# Patient Record
Sex: Female | Born: 1955
Health system: Southern US, Community
[De-identification: ages and names within clinical notes are randomized; demographics above are authoritative.]

## PROBLEM LIST (undated history)

## (undated) DIAGNOSIS — J449 Chronic obstructive pulmonary disease, unspecified: Secondary | ICD-10-CM

## (undated) DIAGNOSIS — E119 Type 2 diabetes mellitus without complications: Secondary | ICD-10-CM

## (undated) DIAGNOSIS — I2699 Other pulmonary embolism without acute cor pulmonale: Secondary | ICD-10-CM

## (undated) DIAGNOSIS — I2781 Cor pulmonale (chronic): Secondary | ICD-10-CM

## (undated) DIAGNOSIS — J683 Other acute and subacute respiratory conditions due to chemicals, gases, fumes and vapors: Secondary | ICD-10-CM

## (undated) DIAGNOSIS — K219 Gastro-esophageal reflux disease without esophagitis: Secondary | ICD-10-CM

## (undated) DIAGNOSIS — I509 Heart failure, unspecified: Secondary | ICD-10-CM

## (undated) DIAGNOSIS — I4891 Unspecified atrial fibrillation: Secondary | ICD-10-CM

## (undated) DIAGNOSIS — E039 Hypothyroidism, unspecified: Secondary | ICD-10-CM

## (undated) DIAGNOSIS — M1990 Unspecified osteoarthritis, unspecified site: Secondary | ICD-10-CM

## (undated) HISTORY — PX: TONSILLECTOMY: SUR1361

## (undated) HISTORY — DX: Gastro-esophageal reflux disease without esophagitis: K21.9

## (undated) HISTORY — DX: Morbid (severe) obesity due to excess calories: E66.01

## (undated) HISTORY — DX: Unspecified atrial fibrillation: I48.91

## (undated) HISTORY — DX: Unspecified osteoarthritis, unspecified site: M19.90

## (undated) HISTORY — DX: Other acute and subacute respiratory conditions due to chemicals, gases, fumes and vapors: J68.3

## (undated) HISTORY — DX: Hypothyroidism, unspecified: E03.9

## (undated) HISTORY — DX: Other pulmonary embolism without acute cor pulmonale: I26.99

## (undated) HISTORY — DX: Cor pulmonale (chronic): I27.81

---

## 1999-07-07 ENCOUNTER — Other Ambulatory Visit: Admission: RE | Admit: 1999-07-07 | Discharge: 1999-07-07 | Payer: Self-pay | Admitting: Family Medicine

## 2002-05-19 ENCOUNTER — Encounter: Admission: RE | Admit: 2002-05-19 | Discharge: 2002-05-19 | Payer: Self-pay | Admitting: Chiropractic Medicine

## 2002-05-19 ENCOUNTER — Encounter: Payer: Self-pay | Admitting: Chiropractic Medicine

## 2003-07-02 ENCOUNTER — Encounter: Payer: Self-pay | Admitting: Family Medicine

## 2003-07-02 LAB — CONVERTED CEMR LAB

## 2004-09-05 ENCOUNTER — Ambulatory Visit: Payer: Self-pay | Admitting: Family Medicine

## 2004-10-30 ENCOUNTER — Ambulatory Visit: Payer: Self-pay | Admitting: Family Medicine

## 2005-02-14 ENCOUNTER — Ambulatory Visit: Payer: Self-pay | Admitting: Family Medicine

## 2005-02-22 ENCOUNTER — Ambulatory Visit: Payer: Self-pay | Admitting: Family Medicine

## 2005-04-09 ENCOUNTER — Ambulatory Visit: Payer: Self-pay | Admitting: Family Medicine

## 2005-05-10 ENCOUNTER — Ambulatory Visit: Payer: Self-pay | Admitting: Family Medicine

## 2005-07-24 ENCOUNTER — Ambulatory Visit: Payer: Self-pay | Admitting: Pulmonary Disease

## 2005-07-24 ENCOUNTER — Encounter: Payer: Self-pay | Admitting: Cardiology

## 2005-07-24 ENCOUNTER — Inpatient Hospital Stay (HOSPITAL_COMMUNITY): Admission: EM | Admit: 2005-07-24 | Discharge: 2005-07-31 | Payer: Self-pay | Admitting: Emergency Medicine

## 2005-07-24 ENCOUNTER — Ambulatory Visit: Payer: Self-pay | Admitting: Cardiology

## 2005-08-14 ENCOUNTER — Ambulatory Visit: Payer: Self-pay | Admitting: Pulmonary Disease

## 2005-09-11 ENCOUNTER — Ambulatory Visit: Payer: Self-pay | Admitting: Pulmonary Disease

## 2005-09-13 ENCOUNTER — Ambulatory Visit: Payer: Self-pay | Admitting: *Deleted

## 2005-09-19 ENCOUNTER — Ambulatory Visit: Payer: Self-pay | Admitting: Internal Medicine

## 2005-10-03 ENCOUNTER — Ambulatory Visit: Payer: Self-pay | Admitting: Cardiology

## 2005-10-26 ENCOUNTER — Ambulatory Visit: Payer: Self-pay | Admitting: Cardiology

## 2005-11-07 ENCOUNTER — Ambulatory Visit: Payer: Self-pay | Admitting: Cardiology

## 2005-12-10 ENCOUNTER — Ambulatory Visit: Payer: Self-pay | Admitting: Pulmonary Disease

## 2005-12-20 ENCOUNTER — Ambulatory Visit: Payer: Self-pay | Admitting: Cardiology

## 2006-01-25 ENCOUNTER — Ambulatory Visit: Payer: Self-pay | Admitting: Cardiology

## 2006-02-20 ENCOUNTER — Ambulatory Visit: Payer: Self-pay | Admitting: Pulmonary Disease

## 2006-03-08 ENCOUNTER — Ambulatory Visit: Payer: Self-pay | Admitting: Cardiology

## 2006-04-26 ENCOUNTER — Ambulatory Visit: Payer: Self-pay | Admitting: Cardiology

## 2006-06-28 ENCOUNTER — Ambulatory Visit: Payer: Self-pay | Admitting: Cardiology

## 2006-07-09 ENCOUNTER — Ambulatory Visit: Payer: Self-pay | Admitting: Pulmonary Disease

## 2006-09-19 ENCOUNTER — Ambulatory Visit: Payer: Self-pay | Admitting: Cardiology

## 2006-11-08 ENCOUNTER — Ambulatory Visit: Payer: Self-pay | Admitting: *Deleted

## 2006-12-23 ENCOUNTER — Ambulatory Visit: Payer: Self-pay | Admitting: Cardiology

## 2007-01-24 ENCOUNTER — Ambulatory Visit: Payer: Self-pay | Admitting: Cardiology

## 2007-02-21 ENCOUNTER — Ambulatory Visit: Payer: Self-pay | Admitting: Pulmonary Disease

## 2007-02-21 LAB — CONVERTED CEMR LAB
BUN: 21 mg/dL (ref 6–23)
Bilirubin Urine: NEGATIVE
CO2: 34 meq/L — ABNORMAL HIGH (ref 19–32)
Calcium: 11.3 mg/dL — ABNORMAL HIGH (ref 8.4–10.5)
Chloride: 100 meq/L (ref 96–112)
Creatinine, Ser: 0.8 mg/dL (ref 0.4–1.2)
GFR calc Af Amer: 97 mL/min
GFR calc non Af Amer: 80 mL/min
Glucose, Bld: 113 mg/dL — ABNORMAL HIGH (ref 70–99)
INR: 2.6 — ABNORMAL HIGH (ref 0.9–2.0)
Ketones, ur: NEGATIVE mg/dL
Leukocytes, UA: NEGATIVE
Nitrite: NEGATIVE
Potassium: 4.1 meq/L (ref 3.5–5.1)
Prothrombin Time: 20.2 s — ABNORMAL HIGH (ref 10.0–14.0)
Sodium: 140 meq/L (ref 135–145)
Specific Gravity, Urine: 1.02 (ref 1.000–1.03)
Total Protein, Urine: NEGATIVE mg/dL
Urine Glucose: NEGATIVE mg/dL
Urobilinogen, UA: 0.2 (ref 0.0–1.0)
pH: 5.5 (ref 5.0–8.0)

## 2007-04-25 ENCOUNTER — Ambulatory Visit: Payer: Self-pay | Admitting: Cardiology

## 2007-06-13 ENCOUNTER — Ambulatory Visit: Payer: Self-pay | Admitting: Cardiology

## 2007-07-25 ENCOUNTER — Ambulatory Visit: Payer: Self-pay | Admitting: Cardiovascular Disease

## 2007-08-11 DIAGNOSIS — E662 Morbid (severe) obesity with alveolar hypoventilation: Secondary | ICD-10-CM

## 2007-08-11 DIAGNOSIS — I279 Pulmonary heart disease, unspecified: Secondary | ICD-10-CM | POA: Insufficient documentation

## 2007-09-19 ENCOUNTER — Ambulatory Visit: Payer: Self-pay | Admitting: Cardiology

## 2007-10-17 ENCOUNTER — Ambulatory Visit: Payer: Self-pay | Admitting: Cardiology

## 2007-11-12 ENCOUNTER — Telehealth: Payer: Self-pay | Admitting: Pulmonary Disease

## 2007-11-21 ENCOUNTER — Ambulatory Visit: Payer: Self-pay | Admitting: Cardiology

## 2007-12-12 ENCOUNTER — Ambulatory Visit: Payer: Self-pay | Admitting: Pulmonary Disease

## 2007-12-17 ENCOUNTER — Telehealth (INDEPENDENT_AMBULATORY_CARE_PROVIDER_SITE_OTHER): Payer: Self-pay | Admitting: *Deleted

## 2008-01-23 ENCOUNTER — Ambulatory Visit: Payer: Self-pay | Admitting: Internal Medicine

## 2008-02-04 ENCOUNTER — Encounter: Payer: Self-pay | Admitting: Family Medicine

## 2008-02-04 DIAGNOSIS — Z86718 Personal history of other venous thrombosis and embolism: Secondary | ICD-10-CM

## 2008-02-04 DIAGNOSIS — J45909 Unspecified asthma, uncomplicated: Secondary | ICD-10-CM | POA: Insufficient documentation

## 2008-02-04 DIAGNOSIS — K219 Gastro-esophageal reflux disease without esophagitis: Secondary | ICD-10-CM | POA: Insufficient documentation

## 2008-02-04 DIAGNOSIS — M5137 Other intervertebral disc degeneration, lumbosacral region: Secondary | ICD-10-CM

## 2008-02-04 DIAGNOSIS — Z87891 Personal history of nicotine dependence: Secondary | ICD-10-CM | POA: Insufficient documentation

## 2008-02-13 ENCOUNTER — Ambulatory Visit: Payer: Self-pay | Admitting: Family Medicine

## 2008-02-13 DIAGNOSIS — E039 Hypothyroidism, unspecified: Secondary | ICD-10-CM | POA: Insufficient documentation

## 2008-02-20 ENCOUNTER — Ambulatory Visit: Payer: Self-pay | Admitting: Cardiovascular Disease

## 2008-04-16 ENCOUNTER — Ambulatory Visit: Payer: Self-pay | Admitting: Cardiology

## 2008-05-21 ENCOUNTER — Ambulatory Visit: Payer: Self-pay | Admitting: Internal Medicine

## 2008-06-25 ENCOUNTER — Ambulatory Visit: Payer: Self-pay | Admitting: Internal Medicine

## 2008-07-16 ENCOUNTER — Ambulatory Visit: Payer: Self-pay | Admitting: Family Medicine

## 2008-07-20 LAB — CONVERTED CEMR LAB
ALT: 31 units/L (ref 0–35)
AST: 24 units/L (ref 0–37)
Albumin: 3.5 g/dL (ref 3.5–5.2)
BUN: 25 mg/dL — ABNORMAL HIGH (ref 6–23)
CO2: 34 meq/L — ABNORMAL HIGH (ref 19–32)
Calcium: 11.4 mg/dL — ABNORMAL HIGH (ref 8.4–10.5)
Chloride: 103 meq/L (ref 96–112)
Cholesterol: 196 mg/dL (ref 0–200)
Creatinine, Ser: 0.8 mg/dL (ref 0.4–1.2)
GFR calc Af Amer: 96 mL/min
GFR calc non Af Amer: 80 mL/min
Glucose, Bld: 155 mg/dL — ABNORMAL HIGH (ref 70–99)
HDL: 31.3 mg/dL — ABNORMAL LOW (ref >39.0)
LDL Cholesterol: 133 mg/dL — ABNORMAL HIGH (ref 0–99)
Phosphorus: 1.8 mg/dL — ABNORMAL LOW (ref 2.3–4.6)
Potassium: 3.8 meq/L (ref 3.5–5.1)
Sodium: 143 meq/L (ref 135–145)
TSH: 5.81 microintl units/mL — ABNORMAL HIGH (ref 0.35–5.50)
Total CHOL/HDL Ratio: 6.3
Triglycerides: 161 mg/dL — ABNORMAL HIGH (ref 0–149)
VLDL: 32 mg/dL (ref 0–40)

## 2008-07-23 ENCOUNTER — Ambulatory Visit: Payer: Self-pay | Admitting: Cardiology

## 2008-07-30 ENCOUNTER — Ambulatory Visit: Payer: Self-pay | Admitting: Family Medicine

## 2008-07-30 DIAGNOSIS — E213 Hyperparathyroidism, unspecified: Secondary | ICD-10-CM

## 2008-08-04 LAB — CONVERTED CEMR LAB
Calcium, Total (PTH): 12.5 mg/dL — ABNORMAL HIGH (ref 8.4–10.5)
Hgb A1c MFr Bld: 7.1 % — ABNORMAL HIGH (ref 4.6–6.0)
PTH: 364.2 pg/mL — ABNORMAL HIGH (ref 14.0–72.0)

## 2008-08-13 ENCOUNTER — Ambulatory Visit: Payer: Self-pay | Admitting: Family Medicine

## 2008-08-13 DIAGNOSIS — E119 Type 2 diabetes mellitus without complications: Secondary | ICD-10-CM

## 2008-08-13 DIAGNOSIS — L538 Other specified erythematous conditions: Secondary | ICD-10-CM | POA: Insufficient documentation

## 2008-08-13 LAB — CONVERTED CEMR LAB
Bilirubin Urine: NEGATIVE
Glucose, Urine, Semiquant: NEGATIVE
Ketones, urine, test strip: NEGATIVE
Nitrite: NEGATIVE
Protein, U semiquant: NEGATIVE
Specific Gravity, Urine: 1.015
Urobilinogen, UA: 0.2
pH: 6

## 2008-08-14 ENCOUNTER — Encounter: Payer: Self-pay | Admitting: Family Medicine

## 2008-08-20 ENCOUNTER — Ambulatory Visit: Payer: Self-pay | Admitting: Cardiovascular Disease

## 2008-10-08 ENCOUNTER — Ambulatory Visit: Payer: Self-pay | Admitting: Cardiology

## 2008-11-19 ENCOUNTER — Ambulatory Visit: Payer: Self-pay | Admitting: Family Medicine

## 2008-11-22 ENCOUNTER — Inpatient Hospital Stay (HOSPITAL_COMMUNITY): Admission: EM | Admit: 2008-11-22 | Discharge: 2008-11-26 | Payer: Self-pay | Admitting: Internal Medicine

## 2008-11-22 ENCOUNTER — Telehealth (INDEPENDENT_AMBULATORY_CARE_PROVIDER_SITE_OTHER): Payer: Self-pay | Admitting: *Deleted

## 2008-11-22 ENCOUNTER — Ambulatory Visit: Payer: Self-pay | Admitting: Internal Medicine

## 2008-11-23 ENCOUNTER — Encounter: Payer: Self-pay | Admitting: Internal Medicine

## 2008-11-23 LAB — CONVERTED CEMR LAB
Hgb A1c MFr Bld: 6.2 % — ABNORMAL HIGH (ref 4.6–6.0)
TSH: 4.13 microintl units/mL (ref 0.35–5.50)

## 2008-11-29 ENCOUNTER — Ambulatory Visit: Payer: Self-pay | Admitting: Cardiology

## 2008-12-03 ENCOUNTER — Ambulatory Visit: Payer: Self-pay | Admitting: Family Medicine

## 2008-12-03 DIAGNOSIS — Z8679 Personal history of other diseases of the circulatory system: Secondary | ICD-10-CM

## 2008-12-07 LAB — CONVERTED CEMR LAB
Creatinine,U: 52.9 mg/dL
Microalb Creat Ratio: 3.8 mg/g (ref 0.0–30.0)
Microalb, Ur: 0.2 mg/dL (ref 0.0–1.9)

## 2008-12-10 ENCOUNTER — Ambulatory Visit: Payer: Self-pay | Admitting: Cardiovascular Disease

## 2008-12-13 ENCOUNTER — Encounter (INDEPENDENT_AMBULATORY_CARE_PROVIDER_SITE_OTHER): Payer: Self-pay | Admitting: *Deleted

## 2008-12-13 ENCOUNTER — Ambulatory Visit: Payer: Self-pay | Admitting: Internal Medicine

## 2009-01-05 ENCOUNTER — Telehealth: Payer: Self-pay | Admitting: Family Medicine

## 2009-01-07 ENCOUNTER — Ambulatory Visit: Payer: Self-pay | Admitting: Cardiology

## 2009-02-09 ENCOUNTER — Encounter (INDEPENDENT_AMBULATORY_CARE_PROVIDER_SITE_OTHER): Payer: Self-pay | Admitting: *Deleted

## 2009-02-18 ENCOUNTER — Ambulatory Visit: Payer: Self-pay | Admitting: Cardiology

## 2009-03-02 ENCOUNTER — Encounter: Payer: Self-pay | Admitting: *Deleted

## 2009-03-18 ENCOUNTER — Ambulatory Visit: Payer: Self-pay | Admitting: Internal Medicine

## 2009-03-18 LAB — CONVERTED CEMR LAB: POC INR: 1.6

## 2009-04-06 ENCOUNTER — Encounter: Payer: Self-pay | Admitting: *Deleted

## 2009-04-15 ENCOUNTER — Ambulatory Visit: Payer: Self-pay | Admitting: Cardiology

## 2009-04-15 LAB — CONVERTED CEMR LAB
POC INR: 1.2
Prothrombin Time: 13.6 s

## 2009-05-12 ENCOUNTER — Encounter (INDEPENDENT_AMBULATORY_CARE_PROVIDER_SITE_OTHER): Payer: Self-pay | Admitting: *Deleted

## 2009-05-17 ENCOUNTER — Ambulatory Visit: Payer: Self-pay | Admitting: Cardiology

## 2009-05-17 LAB — CONVERTED CEMR LAB: POC INR: 2.1

## 2009-06-20 ENCOUNTER — Telehealth: Payer: Self-pay | Admitting: Internal Medicine

## 2009-06-23 ENCOUNTER — Ambulatory Visit: Payer: Self-pay | Admitting: Internal Medicine

## 2009-06-23 LAB — CONVERTED CEMR LAB: POC INR: 2

## 2009-08-08 ENCOUNTER — Ambulatory Visit: Payer: Self-pay | Admitting: Internal Medicine

## 2009-08-08 LAB — CONVERTED CEMR LAB: POC INR: 1.8

## 2009-08-16 ENCOUNTER — Ambulatory Visit: Payer: Self-pay | Admitting: Family Medicine

## 2009-08-16 DIAGNOSIS — E7800 Pure hypercholesterolemia, unspecified: Secondary | ICD-10-CM

## 2009-08-16 LAB — CONVERTED CEMR LAB
ALT: 29 units/L (ref 0–35)
AST: 18 units/L (ref 0–37)
Albumin: 3.6 g/dL (ref 3.5–5.2)
BUN: 16 mg/dL (ref 6–23)
CO2: 34 meq/L — ABNORMAL HIGH (ref 19–32)
Calcium: 11.4 mg/dL — ABNORMAL HIGH (ref 8.4–10.5)
Chloride: 104 meq/L (ref 96–112)
Cholesterol: 212 mg/dL — ABNORMAL HIGH (ref 0–200)
Creatinine, Ser: 0.7 mg/dL (ref 0.4–1.2)
Direct LDL: 157.6 mg/dL
GFR calc non Af Amer: 92.56 mL/min (ref >60)
Glucose, Bld: 143 mg/dL — ABNORMAL HIGH (ref 70–99)
HDL: 38.1 mg/dL — ABNORMAL LOW (ref >39.00)
Hgb A1c MFr Bld: 6 % (ref 4.6–6.5)
Phosphorus: 1.6 mg/dL — ABNORMAL LOW (ref 2.3–4.6)
Potassium: 4.4 meq/L (ref 3.5–5.1)
Sodium: 145 meq/L (ref 135–145)
TSH: 12.27 microintl units/mL — ABNORMAL HIGH (ref 0.35–5.50)
Total CHOL/HDL Ratio: 6
Triglycerides: 155 mg/dL — ABNORMAL HIGH (ref 0.0–149.0)
VLDL: 31 mg/dL (ref 0.0–40.0)

## 2009-08-19 ENCOUNTER — Ambulatory Visit: Payer: Self-pay | Admitting: Family Medicine

## 2009-08-19 DIAGNOSIS — Z87448 Personal history of other diseases of urinary system: Secondary | ICD-10-CM

## 2009-08-19 LAB — CONVERTED CEMR LAB
Bilirubin Urine: NEGATIVE
Glucose, Urine, Semiquant: NEGATIVE
Ketones, urine, test strip: NEGATIVE
Nitrite: NEGATIVE
Protein, U semiquant: NEGATIVE
Specific Gravity, Urine: 1.015
Urobilinogen, UA: 0.2
WBC Urine, dipstick: NEGATIVE
pH: 5

## 2009-09-01 ENCOUNTER — Ambulatory Visit: Payer: Self-pay | Admitting: Cardiovascular Disease

## 2009-09-01 LAB — CONVERTED CEMR LAB: POC INR: 2.3

## 2009-09-21 ENCOUNTER — Ambulatory Visit: Payer: Self-pay | Admitting: Internal Medicine

## 2009-09-21 ENCOUNTER — Encounter (INDEPENDENT_AMBULATORY_CARE_PROVIDER_SITE_OTHER): Payer: Self-pay | Admitting: Cardiology

## 2009-09-21 LAB — CONVERTED CEMR LAB: POC INR: 1.8

## 2009-09-28 ENCOUNTER — Encounter: Payer: Self-pay | Admitting: Family Medicine

## 2009-09-29 ENCOUNTER — Ambulatory Visit: Payer: Self-pay | Admitting: Family Medicine

## 2009-09-30 ENCOUNTER — Ambulatory Visit: Payer: Self-pay | Admitting: Internal Medicine

## 2009-09-30 ENCOUNTER — Inpatient Hospital Stay (HOSPITAL_COMMUNITY): Admission: EM | Admit: 2009-09-30 | Discharge: 2009-10-06 | Payer: Self-pay | Admitting: Emergency Medicine

## 2009-10-01 ENCOUNTER — Ambulatory Visit: Payer: Self-pay | Admitting: Emergency Medicine

## 2009-10-06 ENCOUNTER — Encounter: Payer: Self-pay | Admitting: Family Medicine

## 2009-10-17 ENCOUNTER — Ambulatory Visit: Payer: Self-pay | Admitting: Cardiology

## 2009-10-17 LAB — CONVERTED CEMR LAB: POC INR: 3.7

## 2009-11-10 ENCOUNTER — Ambulatory Visit: Payer: Self-pay | Admitting: Cardiology

## 2009-11-22 ENCOUNTER — Ambulatory Visit: Payer: Self-pay | Admitting: Family Medicine

## 2009-11-25 ENCOUNTER — Encounter: Payer: Self-pay | Admitting: Family Medicine

## 2009-11-25 LAB — CONVERTED CEMR LAB
ALT: 21 units/L (ref 0–35)
CO2: 32 meq/L (ref 19–32)
Creatinine, Ser: 0.7 mg/dL (ref 0.4–1.2)
GFR calc non Af Amer: 92.47 mL/min (ref >60)
HDL: 48.2 mg/dL (ref >39.00)
Hgb A1c MFr Bld: 5.9 % (ref 4.6–6.5)
Potassium: 4.2 meq/L (ref 3.5–5.1)
Sodium: 142 meq/L (ref 135–145)
Total CHOL/HDL Ratio: 3

## 2009-12-16 ENCOUNTER — Ambulatory Visit: Payer: Self-pay | Admitting: Cardiovascular Disease

## 2009-12-16 ENCOUNTER — Encounter (INDEPENDENT_AMBULATORY_CARE_PROVIDER_SITE_OTHER): Payer: Self-pay | Admitting: Cardiology

## 2010-01-25 ENCOUNTER — Telehealth: Payer: Self-pay | Admitting: Family Medicine

## 2010-01-27 ENCOUNTER — Ambulatory Visit: Payer: Self-pay | Admitting: Cardiology

## 2010-02-01 ENCOUNTER — Telehealth: Payer: Self-pay | Admitting: Family Medicine

## 2010-02-06 ENCOUNTER — Encounter: Payer: Self-pay | Admitting: Family Medicine

## 2010-02-08 ENCOUNTER — Telehealth: Payer: Self-pay | Admitting: Family Medicine

## 2010-03-03 ENCOUNTER — Ambulatory Visit: Payer: Self-pay | Admitting: Cardiovascular Disease

## 2010-04-21 ENCOUNTER — Encounter: Payer: Self-pay | Admitting: Cardiology

## 2010-04-21 ENCOUNTER — Ambulatory Visit: Payer: Self-pay | Admitting: Family Medicine

## 2010-04-21 LAB — CONVERTED CEMR LAB
Bilirubin Urine: NEGATIVE
Blood in Urine, dipstick: NEGATIVE
Glucose, Urine, Semiquant: NEGATIVE
POC INR: 1.98
WBC Urine, dipstick: NEGATIVE
pH: 5

## 2010-04-23 LAB — CONVERTED CEMR LAB
Calcium: 12.2 mg/dL — ABNORMAL HIGH (ref 8.4–10.5)
Hgb A1c MFr Bld: 6.5 % — ABNORMAL HIGH (ref <5.7)
Phosphorus: 2.2 mg/dL — ABNORMAL LOW (ref 2.3–4.6)
Potassium: 4.6 meq/L (ref 3.5–5.3)
Prothrombin Time: 22.3 s — ABNORMAL HIGH (ref 11.6–15.2)
Sodium: 140 meq/L (ref 135–145)
TSH: 5.616 microintl units/mL — ABNORMAL HIGH (ref 0.350–4.500)

## 2010-04-25 ENCOUNTER — Encounter: Payer: Self-pay | Admitting: Cardiology

## 2010-05-18 ENCOUNTER — Ambulatory Visit: Payer: Self-pay | Admitting: Cardiovascular Disease

## 2010-06-06 ENCOUNTER — Encounter: Payer: Self-pay | Admitting: Family Medicine

## 2010-06-15 ENCOUNTER — Ambulatory Visit: Payer: Self-pay | Admitting: Cardiology

## 2010-06-21 ENCOUNTER — Telehealth: Payer: Self-pay | Admitting: Family Medicine

## 2010-07-26 ENCOUNTER — Encounter: Payer: Self-pay | Admitting: Internal Medicine

## 2010-07-26 ENCOUNTER — Ambulatory Visit: Payer: Self-pay | Admitting: Family Medicine

## 2010-07-26 LAB — CONVERTED CEMR LAB: POC INR: 1.6

## 2010-07-27 LAB — CONVERTED CEMR LAB
BUN: 20 mg/dL (ref 6–23)
CO2: 29 meq/L (ref 19–32)
Calcium: 11.1 mg/dL — ABNORMAL HIGH (ref 8.4–10.5)
Creatinine, Ser: 0.7 mg/dL (ref 0.4–1.2)
Hgb A1c MFr Bld: 7.5 % — ABNORMAL HIGH (ref 4.6–6.5)
Prothrombin Time: 16.7 s — ABNORMAL HIGH (ref 9.7–11.8)

## 2010-07-28 ENCOUNTER — Encounter: Payer: Self-pay | Admitting: Internal Medicine

## 2010-08-03 ENCOUNTER — Ambulatory Visit: Payer: Self-pay | Admitting: Family Medicine

## 2010-08-17 ENCOUNTER — Ambulatory Visit: Payer: Self-pay | Admitting: Cardiovascular Disease

## 2010-08-17 LAB — CONVERTED CEMR LAB: POC INR: 2.1

## 2010-09-14 ENCOUNTER — Ambulatory Visit: Payer: Self-pay | Admitting: Internal Medicine

## 2010-09-14 LAB — CONVERTED CEMR LAB: POC INR: 2.3

## 2010-09-15 ENCOUNTER — Ambulatory Visit: Payer: Self-pay | Admitting: Family Medicine

## 2010-09-19 ENCOUNTER — Ambulatory Visit: Payer: Self-pay | Admitting: Family Medicine

## 2010-09-21 ENCOUNTER — Telehealth: Payer: Self-pay | Admitting: Family Medicine

## 2010-09-29 ENCOUNTER — Ambulatory Visit: Payer: Self-pay | Admitting: Family Medicine

## 2010-10-11 ENCOUNTER — Ambulatory Visit
Admission: RE | Admit: 2010-10-11 | Discharge: 2010-10-11 | Payer: Self-pay | Source: Home / Self Care | Attending: Family Medicine | Admitting: Family Medicine

## 2010-10-12 ENCOUNTER — Telehealth: Payer: Self-pay | Admitting: Family Medicine

## 2010-10-17 ENCOUNTER — Ambulatory Visit
Admission: RE | Admit: 2010-10-17 | Discharge: 2010-10-17 | Payer: Self-pay | Source: Home / Self Care | Attending: Family Medicine | Admitting: Family Medicine

## 2010-10-17 ENCOUNTER — Encounter: Payer: Self-pay | Admitting: Cardiology

## 2010-10-18 ENCOUNTER — Encounter: Payer: Self-pay | Admitting: Cardiology

## 2010-10-30 ENCOUNTER — Telehealth (INDEPENDENT_AMBULATORY_CARE_PROVIDER_SITE_OTHER): Payer: Self-pay | Admitting: *Deleted

## 2010-10-31 NOTE — Letter (Signed)
Summary: CMN for CPAP/[facility]  CMN for CPAP/[facility]   Imported By: Lanelle Bal 06/13/2010 12:59:12  _____________________________________________________________________  External Attachment:    Type:   Image     Comment:   External Document

## 2010-10-31 NOTE — Medication Information (Signed)
Summary: CCR  Anticoagulant Therapy  Managed by: Cloyde Reams, RN, BSN Referring MD: Dr Tenny Craw PCP: Judith Part MD Supervising MD: Daleen Squibb MD, Maisie Fus Indication 1: Pulmonary embolus (ICD-415.19) Lab Used: [facility] Site: [facility] INR POC 4.1 INR RANGE 2 - 3  Dietary changes: no    Health status changes: no    Bleeding/hemorrhagic complications: no    Recent/future hospitalizations: no    Any changes in medication regimen? no    Recent/future dental: no  Any missed doses?: no       Is patient compliant with meds? yes       Allergies: 1)  ! Sudafed  Anticoagulation Management History:      The patient is taking warfarin and comes in today for a routine follow up visit.  Positive risk factors for bleeding include presence of serious comorbidities.  Negative risk factors for bleeding include an age less than 68 years old.  The bleeding index is 'intermediate risk'.  Positive CHADS2 values include History of Diabetes.  Negative CHADS2 values include Age > 40 years old.  The start date was 08/31/2005.  Her last INR was 2.6 RATIO.  Anticoagulation responsible provider: Daleen Squibb MD, Maisie Fus.  INR POC: 4.1.  Cuvette Lot#: [id number].  Exp: 12/2010.    Anticoagulation Management Assessment/Plan:      The patient's current anticoagulation dose is Coumadin 5 mg  tabs: Take as directed by coumadin clinic..  The target INR is 2 - 3.  The next INR is due 12/01/2009.  Anticoagulation instructions were given to patient.  Results were reviewed/authorized by Cloyde Reams, RN, BSN.  She was notified by Cloyde Reams RN.         Prior Anticoagulation Instructions: INR 3.7  Instructed pt to hold tonight's dose of coumadin, decr dosage to 1 tablet daily except 1/2 tablet on Mondays and Fridays.  Recheck in 3 weeks.    Current Anticoagulation Instructions: INR 4.1  Hold x 1 dose then start taking 1 tablet daily except 1/2 tablet on Mondays, Wednesdays and Fridays.  Recheck in 3 weeks.  Pt WCB  for appt.

## 2010-10-31 NOTE — Miscellaneous (Signed)
Summary: Levothyroid rx  Medications Added LEVOTHROID 125 MCG TABS (LEVOTHYROXINE SODIUM) Take one tablet by mouth once daily       Clinical Lists Changes  Medications: Added new medication of LEVOTHROID 125 MCG TABS (LEVOTHYROXINE SODIUM) Take one tablet by mouth once daily - Signed Removed medication of LEVOTHROID 112 MCG TABS (LEVOTHYROXINE SODIUM) 1 by mouth once daily Rx of LEVOTHROID 125 MCG TABS (LEVOTHYROXINE SODIUM) Take one tablet by mouth once daily;  #30 x 11;  Signed;  Entered by: Lewanda Rife LPN;  Authorized by: Judith Part MD;  Method used: [id number] [address] [place], [place], [place]  [address], Ph: [phone], Fax: [phone]    Prescriptions: LEVOTHROID 125 MCG TABS (LEVOTHYROXINE SODIUM) Take one tablet by mouth once daily  #30 x 11   Entered by:   Lewanda Rife LPN   Authorized by:   Judith Part MD   Signed by:   Lewanda Rife LPN on 69/62/9528   Method used:   [facility]ically to        [facility] ([facility]ail)       [address]       [place], [place]  [address]       Ph: [phone]       Fax: [phone]   RxID:   [id number]   Prior Medications: ADVAIR DISKUS 250-50 MCG/DOSE  MISC (FLUTICASONE-SALMETEROL) 1 Inhalation two times a day FUROSEMIDE 80 MG TABS (FUROSEMIDE) Take 1 tablet by mouth two times a day OMEPRAZOLE 20 MG  CPDR (OMEPRAZOLE) Take 1 tablet by mouth two times a day COUMADIN 5 MG  TABS (WARFARIN SODIUM) Take as directed by coumadin clinic. KLOR-CON 20 MEQ  PACK (POTASSIUM CHLORIDE) take 2 tabs by mouth once daily NASONEX 50 MCG/ACT  SUSP (MOMETASONE FUROATE) 1 spray each nostril once daily DICLOFENAC SODIUM 75 MG  TBEC (DICLOFENAC SODIUM) 1 by mouth two times a day with food as needed pain ELOCON 0.1 %  CREA (MOMETASONE FUROATE) tiny amount to each ear canal twice weekly as needed CICLOPIROX OLAMINE 0.77 % CREA (CICLOPIROX OLAMINE) apply to aff  area two times a day prn DILTIAZEM HCL 120 MG TABS (DILTIAZEM HCL) 1 by mouth once daily ONETOUCH ULTRA TEST  STRP (GLUCOSE BLOOD) for one touch ultra mini glucometer, to test blood sugar three times a day and as needed ONETOUCH ULTRASOFT LANCETS  MISC (LANCETS) to test blood sugar  three times a day and as needed ZOCOR 20 MG TABS (SIMVASTATIN) 1 by mouth once daily in evening OXYGEN () 3 Liters 24/7 CLARITIN 10 MG TABS (LORATADINE) OTC As directed. LEVOTHROID 125 MCG TABS (LEVOTHYROXINE SODIUM) Take one tablet by mouth once daily Current Allergies: ! SUDAFED

## 2010-10-31 NOTE — Medication Information (Signed)
Summary: ccr/per pulmonary  Anticoagulant Therapy  Managed by: Cloyde Reams, RN, BSN Referring MD: Dr Tenny Craw PCP: Judith Part MD Supervising MD: Jens Som MD, Arlys John Indication 1: Pulmonary embolus (ICD-415.19) Lab Used: [facility] Site: [facility] INR POC 3.7 INR RANGE 2 - 3    Bleeding/hemorrhagic complications: no     Any changes in medication regimen? no     Any missed doses?: yes     Details: Took 1 tablet on Monday instead of 1/2, so then took 1/2 on Friday instead of 1 tablet to make up for extra 1/2 tablet.      Allergies: 1)  ! Sudafed  Anticoagulation Management History:      The patient is taking warfarin and comes in today for a routine follow up visit.  Positive risk factors for bleeding include presence of serious comorbidities.  Negative risk factors for bleeding include an age less than 33 years old.  The bleeding index is 'intermediate risk'.  Positive CHADS2 values include History of Diabetes.  Negative CHADS2 values include Age > 48 years old.  The start date was 08/31/2005.  Her last INR was 2.6 RATIO.  Anticoagulation responsible provider: Jens Som MD, Arlys John.  INR POC: 3.7.  Cuvette Lot#: [id number].  Exp: 12/2010.    Anticoagulation Management Assessment/Plan:      The patient's current anticoagulation dose is Coumadin 5 mg  tabs: Take as directed by coumadin clinic..  The target INR is 2 - 3.  The next INR is due 11/07/2009.  Anticoagulation instructions were given to patient.  Results were reviewed/authorized by Cloyde Reams, RN, BSN.  She was notified by Cloyde Reams RN.         Prior Anticoagulation Instructions: INR 1.8  Take 1.5 tabs today, then begin 1 tab daily except 0.5 tab each Monday.  Recheck in 3 - 4 weeks.    Current Anticoagulation Instructions: INR 3.7  Instructed pt to hold tonight's dose of coumadin, decr dosage to 1 tablet daily except 1/2 tablet on Mondays and Fridays.  Recheck in 3 weeks.

## 2010-10-31 NOTE — Assessment & Plan Note (Signed)
Summary: 3 MONTH FOLLWO UP/[facility]   Vital Signs:  Patient profile:   55 year old female Temp:     97.7 degrees F oral Pulse rate:   76 / minute Pulse rhythm:   regular Resp:     20 per minute BP sitting:   114 / 76  (left arm) Cuff size:   large  Vitals Entered By: Lewanda Rife LPN (November 22, 2009 8:14 AM)  History of Present Illness: here for f/u of mult med probs  was in hosp for resp failure/ pneumonia/ cor pulm- better from that--had episode of syncope  also acute renal insufff from dehydration- corrected itself   is on cpap now -- is helping her a lot / this helps energy level   following ca with endo -- at [place]  did some tests  suggested getting a surgeon in the future   thyroid due for check-- does not feel like any changes in it   lipids duee for check  DM has been well controlled - due for check was on steroids in the hospital - so exp her AIC to be up a bit  has not had eye exam  utd imms    is interested in exercise when she can-- is generally sob with exertion of any kind  has pulm f/u in 6 mo    Allergies: 1)  ! Sudafed  Past History:  Past Medical History: Last updated: 09/30/2009 GERD Pulmonary embolism, hx of--on coumadin morbid obesity reactive airways cor pulmonale, and obesity hypoventilation syndrome hypothyroidism OA- widespread 2010 a fib- with cardioversion/successful   Echo 11/03/2008  -  Abnormal septal motion Overall left ventricular systolic function         was normal. Left ventricular ejection fraction was estimated         to be 55 %.   -  The aortic valve was mildly calcified.    pulm----Dr Clance  Past Surgical History: Last updated: 02/13/2008 Tonsillectomy Hosp- resp/ cor pulmonale,  PE  Family History: Last updated: 08/13/2008 Father: MI age 71 Mother:  Siblings: brother with DM- severe   Social History: Last updated: 08/19/2009 Marital Status: Married Children: 1 son Occupation: Scientist, physiological smoker- quit    Risk Factors: Smoking Status: quit (02/04/2008)  Review of Systems General:  Complains of fatigue; denies fever, loss of appetite, and malaise. Eyes:  Denies blurring and eye pain. CV:  Complains of shortness of breath with exertion; denies chest pain or discomfort, lightheadness, and palpitations. Resp:  Complains of shortness of breath; denies sputum productive and wheezing. GI:  Denies abdominal pain, change in bowel habits, and indigestion. GU:  Denies dysuria. MS:  Complains of joint pain and stiffness. Derm:  Denies itching, lesion(s), poor wound healing, and rash. Neuro:  Denies numbness and tingling. Psych:  mood is fair . Endo:  Denies excessive thirst and excessive urination. Heme:  Denies abnormal bruising and bleeding.  Physical Exam  General:  morbidly obese and in wheelchair  Head:  normocephalic, atraumatic, and no abnormalities observed.   Eyes:  vision grossly intact, pupils equal, pupils round, and pupils reactive to light.  no conjunctival pallor, injection or icterus  Ears:  R ear normal and L ear normal.   Nose:  no nasal discharge.   Mouth:  pharynx pink and moist.   Neck:  supple with full rom and no masses or thyromegally, no JVD or carotid bruit  Chest Wall:  No deformities, masses, or tenderness noted. Lungs:  difffusely distant bs no  rales or crackles today  mildly prolonged exp phase Heart:  RRR, no M or gallop  Abdomen:  soft and non-tender.   Msk:  No deformity or scoliosis noted of thoracic or lumbar spine.   Pulses:  plus one pedal pulses Extremities:  trace left pedal edema and trace right pedal edema.   Neurologic:  sensation intact to light touch, gait normal, and DTRs symmetrical and normal.   Skin:  Intact without suspicious lesions or rashes Cervical Nodes:  No lymphadenopathy noted Psych:  in good spirits today   Impression & Recommendations:  Problem # 1:  PURE HYPERCHOLESTEROLEMIA (ICD-272.0) Assessment Unchanged  labs today -  exp imp on zocor and good diet  f/u 3 mo rev low sat fat diet  Her updated medication list for this problem includes:    Zocor 20 Mg Tabs (Simvastatin) .Marland Kitchen... 1 by mouth once daily in evening  Orders: Venipuncture ([address]) TLB-Lipid Panel (80061-LIPID) TLB-Renal Function Panel (80069-RENAL) TLB-ALT (SGPT) (84460-ALT) TLB-AST (SGOT) (84450-SGOT) TLB-TSH (Thyroid Stimulating Hormone) (84443-TSH) TLB-A1C / Hgb A1C (Glycohemoglobin) (83036-A1C)  Labs Reviewed: SGOT: 18 (08/16/2009)   SGPT: 29 (08/16/2009)   HDL:38.10 (08/16/2009), 31.3 (07/16/2008)  LDL:133 (07/16/2008)  Chol:212 (08/16/2009), 196 (07/16/2008)  Trig:155.0 (08/16/2009), 161 (07/16/2008)  Problem # 2:  RESPIRATORY FAILURE, ACUTE (ICD-518.81) Assessment: Comment Only resolved - s/p hosp up to date on imms  adv wt loss doing better on cpap  under pulm care   Problem # 3:  DIABETES MELLITUS, MILD (ICD-250.00) Assessment: Unchanged  expect inc AIC with recent steriods disc healthy diet (low simple sugar/ choose complex carbs/ low sat fat) diet and exercise in detail  exercise as tolerated  still cannot afford eye exam  Orders: Venipuncture ([address]) TLB-Lipid Panel (80061-LIPID) TLB-Renal Function Panel (80069-RENAL) TLB-ALT (SGPT) (84460-ALT) TLB-AST (SGOT) (84450-SGOT) TLB-TSH (Thyroid Stimulating Hormone) (84443-TSH) TLB-A1C / Hgb A1C (Glycohemoglobin) (83036-A1C)  Labs Reviewed: Creat: 0.7 (08/16/2009)    Reviewed HgBA1c results: 6.0 (08/16/2009)  6.2 (11/19/2008)  Problem # 4:  HYPERCALCEMIA (ICD-275.42) Assessment: Unchanged under care of endo may need parathyroidect in future  not very symptomatic now  Problem # 5:  HYPOTHYROIDISM (ICD-244.9) Assessment: Unchanged  lab today and then refil med  no clinical changes  Her updated medication list for this problem includes:    Levothroid 112 Mcg Tabs (Levothyroxine sodium) .Marland Kitchen... 1 by mouth once daily  Orders: Venipuncture (09811) TLB-Lipid Panel  (80061-LIPID) TLB-Renal Function Panel (80069-RENAL) TLB-ALT (SGPT) (84460-ALT) TLB-AST (SGOT) (84450-SGOT) TLB-TSH (Thyroid Stimulating Hormone) (84443-TSH) TLB-A1C / Hgb A1C (Glycohemoglobin) (83036-A1C)  Labs Reviewed: TSH: 12.27 (08/16/2009)    HgBA1c: 6.0 (08/16/2009) Chol: 212 (08/16/2009)   HDL: 38.10 (08/16/2009)   LDL: 133 (07/16/2008)   TG: 155.0 (08/16/2009)  Complete Medication List: 1)  Advair Diskus 250-50 Mcg/dose Misc (Fluticasone-salmeterol) .Marland Kitchen.. 1 inhalation two times a day 2)  Furosemide 80 Mg Tabs (Furosemide) .... Take 1 tablet by mouth two times a day 3)  Omeprazole 20 Mg Cpdr (Omeprazole) .... Take 1 tablet by mouth two times a day 4)  Coumadin 5 Mg Tabs (Warfarin sodium) .... Take as directed by coumadin clinic. 5)  Klor-con 20 Meq Pack (Potassium chloride) .... Take 2 tabs by mouth once daily 6)  Nasonex 50 Mcg/act Susp (Mometasone furoate) .Marland Kitchen.. 1 spray each nostril once daily 7)  Levothroid 112 Mcg Tabs (Levothyroxine sodium) .Marland Kitchen.. 1 by mouth once daily 8)  Diclofenac Sodium 75 Mg Tbec (Diclofenac sodium) .Marland Kitchen.. 1 by mouth two times a day with food as needed pain 9)  Elocon  0.1 % Crea (Mometasone furoate) .... Tiny amount to each ear canal twice weekly as needed 10)  Ciclopirox Olamine 0.77 % Crea (Ciclopirox olamine) .... Apply to aff area two times a day prn 11)  Diltiazem Hcl 120 Mg Tabs (Diltiazem hcl) .Marland Kitchen.. 1 by mouth once daily 12)  Onetouch Ultra Test Strp (Glucose blood) .... For one touch ultra mini glucometer, to test blood sugar three times a day and as needed 13)  Onetouch Ultrasoft Lancets Misc (Lancets) .... To test blood sugar  three times a day and as needed 14)  Zocor 20 Mg Tabs (Simvastatin) .Marland Kitchen.. 1 by mouth once daily in evening 15)  Oxygen  .Marland Kitchen.. 3 liters 24/7 16)  Claritin 10 Mg Tabs (Loratadine) .... Otc as directed.  Patient Instructions: 1)  labs today 2)  keep working on diet and weight loss  3)  follow up in about 3 months    Prescriptions: ZOCOR 20 MG TABS (SIMVASTATIN) 1 by mouth once daily in evening  #30 x 11   Entered and Authorized by:   Judith Part MD   Signed by:   Judith Part MD on 11/22/2009   Method used:   [facility] to        [facility]       [address]       [place], [place]  [address]       Ph: [phone]       Fax: [phone]   RxID:   [id number] DILTIAZEM HCL 120 MG TABS (DILTIAZEM HCL) 1 by mouth once daily  #30 x 11   Entered and Authorized by:   Judith Part MD   Signed by:   Judith Part MD on 11/22/2009   Method used:   [facility]ronically to        [facility]       [address]       [place], [place]  [address]       Ph: [phone]       Fax: [phone]   RxID:   [id number] NASONEX 50 MCG/ACT  SUSP (MOMETASONE FUROATE) 1 spray each nostril once daily  #1 mdi x 11   Entered and Authorized by:   Judith Part MD   Signed by:   Judith Part MD on 11/22/2009   Method used:   Electronically to        [facility]       [address]       [place], [place]  [address]       Ph: [phone]       Fax: [phone]   RxID:   [id number] KLOR-CON 20 MEQ  PACK (POTASSIUM CHLORIDE) take 2 tabs by mouth once daily  #60 x 11   Entered and Authorized by:   Judith Part MD   Signed by:   Judith Part MD on 11/22/2009   Method used:   [facility]ronically to        [facility]       [address]       [place], [place]  [address]       Ph: [phone]  Fax: [phone]   RxID:   [id number] FUROSEMIDE 80 MG TABS (FUROSEMIDE) Take 1 tablet by mouth two times a day  #60 x 11   Entered and Authorized by:   Judith Part MD   Signed by:   Judith Part MD on 11/22/2009   Method used:   Electronically to        [facility]        [address]       [place], [place]  [address]       Ph: [phone]       Fax: [phone]   RxID:   [id number] ADVAIR DISKUS 250-50 MCG/DOSE  MISC (FLUTICASONE-SALMETEROL) 1 Inhalation two times a day  #1 diskus x 11   Entered and Authorized by:   Judith Part MD   Signed by:   Judith Part MD on 11/22/2009   Method used:   Electronically to        [facility]       [address]       [place], [place]  [address]       Ph: [phone]       Fax: [phone]   RxID:   [id number]   Current Allergies (reviewed today): ! SUDAFED     Preventive Care Screening     cannot afford eye exam for DM

## 2010-10-31 NOTE — Medication Information (Signed)
Summary: rov/tm  Anticoagulant Therapy  Managed by: Weston Brass, PharmD Referring MD: Dr Tenny Craw PCP: Judith Part MD Supervising MD: Excell Seltzer MD, Casimiro Needle Indication 1: Pulmonary embolus (ICD-415.19) Lab Used: [facility] Site: [facility] INR POC 1.6 INR RANGE 2 - 3  Dietary changes: no    Health status changes: no    Bleeding/hemorrhagic complications: no    Recent/future hospitalizations: no    Any changes in medication regimen? no    Recent/future dental: no  Any missed doses?: no       Is patient compliant with meds? yes       Allergies: 1)  ! Sudafed  Anticoagulation Management History:      Positive risk factors for bleeding include presence of serious comorbidities.  Negative risk factors for bleeding include an age less than 43 years old.  The bleeding index is 'intermediate risk'.  Positive CHADS2 values include History of Diabetes.  Negative CHADS2 values include Age > 65 years old.  The start date was 08/31/2005.  Her last INR was 2.6 RATIO.  Anticoagulation responsible provider: Excell Seltzer MD, Casimiro Needle.  INR POC: 1.6.  Exp: 03/2011.    Anticoagulation Management Assessment/Plan:      The patient's current anticoagulation dose is Coumadin 5 mg  tabs: Take as directed by coumadin clinic..  The target INR is 2 - 3.  The next INR is due 03/20/2010.  Anticoagulation instructions were given to patient.  Results were reviewed/authorized by Weston Brass, PharmD.  She was notified by Weston Brass PharmD.         Prior Anticoagulation Instructions: INR 2.0 Continue 5mg s everyday except 2.5mg s on Mondays, Wednesdays and Fridays. Recheck in 4 weeks.   Current Anticoagulation Instructions: INR 1.6  Take 1 tablet today then increase dose to 1 tablet every day except 1/2 tablet on Monday and Friday.

## 2010-10-31 NOTE — Medication Information (Signed)
Summary: Coumadin Clinic   Anticoagulant Therapy  Managed by: Weston Brass, PharmD Referring MD: Dr Tenny Craw PCP: Judith Part MD Supervising MD: Ladona Ridgel MD, Sharlot Gowda Indication 1: Pulmonary embolus (ICD-415.19) Lab Used: [facility] Site: [facility] INR POC 1.6 INR RANGE 2 - 3  Dietary changes: no    Health status changes: no    Bleeding/hemorrhagic complications: no    Recent/future hospitalizations: no    Any changes in medication regimen? yes       Details: taking multivitamin with vitamin k; discussed okay as long as she keeps the same one  Recent/future dental: no  Any missed doses?: no       Is patient compliant with meds? yes       Allergies: 1)  ! Sudafed  Anticoagulation Management History:      The patient is taking warfarin and comes in today for a routine follow up visit.  Positive risk factors for bleeding include presence of serious comorbidities.  Negative risk factors for bleeding include an age less than 72 years old.  The bleeding index is 'intermediate risk'.  Positive CHADS2 values include History of Diabetes.  Negative CHADS2 values include Age > 73 years old.  The start date was 08/31/2005.  Her last INR was 1.6 ratio.  Anticoagulation responsible provider: Ladona Ridgel MD, Sharlot Gowda.  INR POC: 1.6.  Exp: 07/2011.    Anticoagulation Management Assessment/Plan:      The patient's current anticoagulation dose is Coumadin 5 mg  tabs: Take as directed by coumadin clinic..  The target INR is 2 - 3.  The next INR is due 08/17/2010.  Anticoagulation instructions were given to patient.  Results were reviewed/authorized by Weston Brass, PharmD.  She was notified by Weston Brass PharmD.         Prior Anticoagulation Instructions: INR 1.8  Take 1 1/2 tablets today then resume same dose of 1 tablet every day except 1/2 tablet on Monday and Friday.   Current Anticoagulation Instructions: INR 1.6  Take 1 1/2 tablets today then increase dose to 1 tablet every day except 1/2  tablet on Friday.  Recheck INR in 2-3 weeks.

## 2010-10-31 NOTE — Progress Notes (Signed)
Summary: Clarification for Dialtizem extended release 120mg   Phone Note Refill Request Message from:  [facility] on January 25, 2010 12:33 PM  Refills Requested: Medication #1:  DILTIAZEM HCL 120 MG TABS 1 by mouth once daily Helmut Muster with [facility] said pt has been getting Diltiazem HCL extended release 120mg  to take one daily. Dr [name] said OK to switch to extended release. Please change for our med list. Thank you.  Initial call taken by: Lewanda Rife LPN,  January 25, 2010 12:34 PM  Follow-up for Phone Call        thanks- I changed on med list Follow-up by: Judith Part MD,  January 25, 2010 1:38 PM    New/Updated Medications: DILTIAZEM HCL ER BEADS 120 MG [id number] (DILTIAZEM HCL ER BEADS) 1 by mouth once daily

## 2010-10-31 NOTE — Progress Notes (Signed)
Summary: Ciclopirox 0.77% cream refill  Phone Note Refill Request Call back at [phone] Message from:  [facility] on Feb 08, 2010 12:56 PM  Refills Requested: Medication #1:  CICLOPIROX OLAMINE 0.77 % CREA apply to aff area two times a day prn [facility] request refill on Ciclopirox olamine 0.77% cream No refill date sent.Please advise.    Method Requested: Telephone to Pharmacy Initial call taken by: Lewanda Rife LPN,  Feb 08, 2010 12:56 PM  Follow-up for Phone Call        px written on EMR for call in  Follow-up by: Judith Part MD,  Feb 08, 2010 1:25 PM  Additional Follow-up for Phone Call Additional follow up Details #1::        Called to [facility]. Additional Follow-up by: Lowella Petties CMA,  Feb 08, 2010 4:09 PM    New/Updated Medications: CICLOPIROX OLAMINE 0.77 % CREA (CICLOPIROX OLAMINE) apply to aff area two times a day prn Prescriptions: CICLOPIROX OLAMINE 0.77 % CREA (CICLOPIROX OLAMINE) apply to aff area two times a day prn  #1 small x 3   Entered and Authorized by:   Judith Part MD   Signed by:   Lowella Petties CMA on 02/08/2010   Method used:   Telephoned to ...       [facility]       [address]       [place], [place]  [address]       Ph: [phone]       Fax: [phone]   RxID:   [id number]

## 2010-10-31 NOTE — Progress Notes (Signed)
Summary: wants someting for sinus infection  Phone Note Call from Patient Call back at Work Phone [phone]   Caller: Patient Call For: Judith Part MD Summary of Call: Patient says that she has a sinus infection and she was here in July with one as well. She is asking if she could get a rx for amoxicillin called in because she doesn't want to pay for an office visit when she has these all the time. I advised her that we do not treat w/ antibiotic over the phone, but she insisted that I ask you anyway. Please advise.  [facility]  Initial call taken by: Melody Comas,  June 21, 2010 9:18 AM  Follow-up for Phone Call        let her know my hands are tied and I cannot treat over the phone- thank you  Follow-up by: Judith Part MD,  June 21, 2010 11:09 AM  Additional Follow-up for Phone Call Additional follow up Details #1::        Patient Advised.  Additional Follow-up by: Delilah Shan CMA Duncan Dull),  June 21, 2010 11:28 AM

## 2010-10-31 NOTE — Letter (Signed)
Summary: Handout Printed  Printed Handout:  - Coumadin Instructions-w/out Meds 

## 2010-10-31 NOTE — Consult Note (Signed)
Summary: [facility]   Imported By: Lanelle Bal 10/14/2009 14:00:26  _____________________________________________________________________  External Attachment:    Type:   Image     Comment:   External Document

## 2010-10-31 NOTE — Progress Notes (Signed)
Summary: pt has diarrhea  Phone Note Call from Patient Call back at Home Phone [phone]   Caller: Patient Call For: Judith Part MD Summary of Call: Pt has had diarrhea since last night, every 20 minutes- watery.  She has tried immodium and  pepto, these didnt help.  She is asking if something can be called to [address].  She has not had vomiting, does have some nausea.  She had body aches and low grade fever earlier in the week. Initial call taken by: Lowella Petties CMA,  Feb 01, 2010 8:58 AM  Follow-up for Phone Call        this sounds viral - and in fact anti diarrhea drugs tend to make viral infx worse and cause swelling/ bloating and abd pain  I recommend keeping up good fluid intake (sips constantly through the day) to prevent dehydration  hopefully this will not last more than 1-2 d - but f/u if not improving  if dizzy/ dry mouth or sagging skin- those are signs of dehydration to go to hospital for  Follow-up by: Judith Part MD,  Feb 01, 2010 9:20 AM  Additional Follow-up for Phone Call Additional follow up Details #1::        Advised pt. Additional Follow-up by: Lowella Petties CMA,  Feb 01, 2010 10:15 AM

## 2010-10-31 NOTE — Medication Information (Signed)
Summary: rov/sl  Anticoagulant Therapy  Managed by: Weston Brass, PharmD Referring MD: Dr Tenny Craw PCP: Judith Part MD Supervising MD: Shirlee Latch MD, Freida Busman Indication 1: Pulmonary embolus (ICD-415.19) Lab Used: [facility] Site: [facility] INR POC 1.8 INR RANGE 2 - 3  Dietary changes: no    Health status changes: no    Bleeding/hemorrhagic complications: no    Recent/future hospitalizations: no    Any changes in medication regimen? yes       Details: started new multivitamin-unsure if it contains vitamin k  Recent/future dental: no  Any missed doses?: no       Is patient compliant with meds? yes      Comments: Pt refuses to return any sooner than 07/20/10. She is aware of the risks associated with not closely monitoring INR.  Allergies: 1)  ! Sudafed  Anticoagulation Management History:      The patient is taking warfarin and comes in today for a routine follow up visit.  Positive risk factors for bleeding include presence of serious comorbidities.  Negative risk factors for bleeding include an age less than 34 years old.  The bleeding index is 'intermediate risk'.  Positive CHADS2 values include History of Diabetes.  Negative CHADS2 values include Age > 47 years old.  The start date was 08/31/2005.  Her last INR was 1.98.  Anticoagulation responsible provider: Shirlee Latch MD, [place].  INR POC: 1.8.  Cuvette Lot#: [id number].  Exp: 07/2011.    Anticoagulation Management Assessment/Plan:      The patient's current anticoagulation dose is Coumadin 5 mg  tabs: Take as directed by coumadin clinic..  The target INR is 2 - 3.  The next INR is due 07/20/2010.  Anticoagulation instructions were given to patient.  Results were reviewed/authorized by Weston Brass, PharmD.  She was notified by Weston Brass PharmD.         Prior Anticoagulation Instructions: INR 2.1   Continue taking Coumadin 1 tab (5 mg) on Sun, Tues, Wed, Thur, Sat and Coumadin 0.5 tab (2.5 mg) on Mondays and Fridays.  Return to  clinic in 4 weeks.   Current Anticoagulation Instructions: INR 1.8  Take 1 1/2 tablets today then resume same dose of 1 tablet every day except 1/2 tablet on Monday and Friday.  Prescriptions: COUMADIN 5 MG  TABS (WARFARIN SODIUM) Take as directed by coumadin clinic.  #30 x 3   Entered by:   Weston Brass PharmD   Authorized by:   Sherrill Raring, MD, [facility]   Signed by:   Weston Brass PharmD on 06/15/2010   Method used:   Electronically to        [facility] [phone]* (retail)       [address]       [place], [place]  [address]       Ph: [phone]       Fax: [phone]   RxID:   [id number]

## 2010-10-31 NOTE — Medication Information (Signed)
Summary: Coumadin Clinic  Anticoagulant Therapy  Managed by: Weston Brass, PharmD Referring MD: Dr Tenny Craw PCP: Judith Part MD Supervising MD: Daleen Squibb MD, Maisie Fus Indication 1: Pulmonary embolus (ICD-415.19) Lab Used: [facility] Site: [facility] PT 22.3 INR POC 1.98 INR RANGE 2 - 3  Dietary changes: no    Health status changes: no    Bleeding/hemorrhagic complications: no    Recent/future hospitalizations: no    Any changes in medication regimen? yes       Details: started augmentin for sinus infection on Friday  Recent/future dental: no  Any missed doses?: no       Is patient compliant with meds? yes      Comments: We attempted to reach pt on Friday to schedule a f/u appt.  She called back today (Monday) to let us know she had Dr. [name] check INR at appt on Friday.    Allergies: 1)  ! Sudafed  Anticoagulation Management History:      Her anticoagulation is being managed by telephone today.  Positive risk factors for bleeding include presence of serious comorbidities.  Negative risk factors for bleeding include an age less than 7 years old.  The bleeding index is 'intermediate risk'.  Positive CHADS2 values include History of Diabetes.  Negative CHADS2 values include Age > 67 years old.  The start date was 08/31/2005.  Her last INR was 1.98.  Prothrombin time is 22.3.  Anticoagulation responsible provider: Daleen Squibb MD, Maisie Fus.  INR POC: 1.98.  Exp: 03/2011.    Anticoagulation Management Assessment/Plan:      The patient's current anticoagulation dose is Coumadin 5 mg  tabs: Take as directed by coumadin clinic..  The target INR is 2 - 3.  The next INR is due 05/18/2010.  Anticoagulation instructions were given to patient.  Results were reviewed/authorized by Weston Brass, PharmD.  She was notified by Weston Brass PharmD.         Prior Anticoagulation Instructions: INR 1.6  Take 1 tablet today then increase dose to 1 tablet every day except 1/2 tablet on Monday and Friday.     Current Anticoagulation Instructions: INR 1.98  Take 1 1/2 tablets today then resume same dose of 1 tablet every day except 1/2 tablet on Monday and Friday.  Recheck INR in 3-4 weeks.

## 2010-10-31 NOTE — Medication Information (Signed)
Summary: rov/sp  Anticoagulant Therapy  Managed by: Reina Fuse, PharmD Referring MD: Dr Tenny Craw PCP: Judith Part MD Supervising MD: Excell Seltzer MD, Casimiro Needle Indication 1: Pulmonary embolus (ICD-415.19) Lab Used: [facility] Site: [facility] INR POC 2.1 INR RANGE 2 - 3  Dietary changes: no    Health status changes: no    Bleeding/hemorrhagic complications: yes       Details: some nosebleeding. had a nosebleed last week that lasted about 20 minutes  Recent/future hospitalizations: no    Any changes in medication regimen? no    Recent/future dental: no  Any missed doses?: no       Is patient compliant with meds? yes       Allergies: 1)  ! Sudafed  Anticoagulation Management History:      The patient is taking warfarin and comes in today for a routine follow up visit.  Positive risk factors for bleeding include presence of serious comorbidities.  Negative risk factors for bleeding include an age less than 50 years old.  The bleeding index is 'intermediate risk'.  Positive CHADS2 values include History of Diabetes.  Negative CHADS2 values include Age > 46 years old.  The start date was 08/31/2005.  Her last INR was 1.98.  Anticoagulation responsible provider: Excell Seltzer MD, Casimiro Needle.  INR POC: 2.1.  Cuvette Lot#: [id number].  Exp: 03/2011.    Anticoagulation Management Assessment/Plan:      The patient's current anticoagulation dose is Coumadin 5 mg  tabs: Take as directed by coumadin clinic..  The target INR is 2 - 3.  The next INR is due 06/15/2010.  Anticoagulation instructions were given to patient.  Results were reviewed/authorized by Reina Fuse, PharmD.  She was notified by Reina Fuse, PharmD.         Prior Anticoagulation Instructions: INR 1.98  Take 1 1/2 tablets today then resume same dose of 1 tablet every day except 1/2 tablet on Monday and Friday.  Recheck INR in 3-4 weeks.   Current Anticoagulation Instructions: INR 2.1   Continue taking Coumadin 1 tab (5 mg) on Sun,  Tues, Wed, Thur, Sat and Coumadin 0.5 tab (2.5 mg) on Mondays and Fridays.  Return to clinic in 4 weeks.

## 2010-10-31 NOTE — Medication Information (Signed)
Summary: cvrr  Anticoagulant Therapy  Managed by: Shelby Dubin, PharmD, BCPS, CPP Referring MD: Dr Tenny Craw PCP: Judith Part MD Supervising MD: Clifton James MD, Cristal Deer Indication 1: Pulmonary embolus (ICD-415.19) Lab Used: [facility] Site: [facility] INR POC 2.1 INR RANGE 2 - 3  Dietary changes: no    Health status changes: no    Bleeding/hemorrhagic complications: no    Recent/future hospitalizations: no    Any changes in medication regimen? no    Recent/future dental: no  Any missed doses?: no       Is patient compliant with meds? yes       Allergies (verified): 1)  ! Sudafed  Anticoagulation Management History:      The patient is taking warfarin and comes in today for a routine follow up visit.  Positive risk factors for bleeding include presence of serious comorbidities.  Negative risk factors for bleeding include an age less than 13 years old.  The bleeding index is 'intermediate risk'.  Positive CHADS2 values include History of Diabetes.  Negative CHADS2 values include Age > 83 years old.  The start date was 08/31/2005.  Her last INR was 2.6 RATIO.  Anticoagulation responsible provider: Clifton James MD, Cristal Deer.  INR POC: 2.1.  Exp: 12/2010.    Anticoagulation Management Assessment/Plan:      The patient's current anticoagulation dose is Coumadin 5 mg  tabs: Take as directed by coumadin clinic..  The target INR is 2 - 3.  The next INR is due 01/13/2010.  Anticoagulation instructions were given to patient.  Results were reviewed/authorized by Shelby Dubin, PharmD, BCPS, CPP.  She was notified by Shelby Dubin PharmD, BCPS, CPP.         Prior Anticoagulation Instructions: INR 4.1  Hold x 1 dose then start taking 1 tablet daily except 1/2 tablet on Mondays, Wednesdays and Fridays.  Recheck in 3 weeks.  Pt WCB for appt.  Current Anticoagulation Instructions: INR 2.1  Continue 0.5 tab each Monday, Wednesday, Friday and 1 tab on all other days.  Recheck in 4 weeks.

## 2010-10-31 NOTE — Assessment & Plan Note (Signed)
Summary: F/U/CLE   Vital Signs:  Patient profile:   55 year old female Height:      65 inches Temp:     98 degrees F oral Pulse rate:   80 / minute Pulse rhythm:   regular BP sitting:   120 / 76  (left arm) Cuff size:   large  Vitals Entered By: Lewanda Rife LPN (April 21, 2010 4:14 PM) CC: follow up and ? sinus infection  and some shortness of breath and low b ack pain on left side.   History of Present Illness: here for f/u of hyperglycemia and hypothyroidism   feels better in light of thyroid - still occas lethargic but less than she was   diet is sometimes good and sometimes bad  does not know why  is getting back to the weight watchers  urine neg today- has back pain  ? sinus infectoin is having pain in her face and blowing yellow mucous  since last wed  has been more sob with the heat  no fever very nasally congested exp on L - worse with her mask and [place]    sciatica is bothering her  urine is clear no burning or urine symptoms  uses ciprlex oint on breasts for rash in summer     Allergies: 1)  ! Sudafed  Past History:  Past Medical History: Last updated: 09/30/2009 GERD Pulmonary embolism, hx of--on coumadin morbid obesity reactive airways cor pulmonale, and obesity hypoventilation syndrome hypothyroidism OA- widespread 2010 a fib- with cardioversion/successful   Echo 11/03/2008  -  Abnormal septal motion Overall left ventricular systolic function         was normal. Left ventricular ejection fraction was estimated         to be 55 %.   -  The aortic valve was mildly calcified.    pulm----Dr Clance  Past Surgical History: Last updated: 02/13/2008 Tonsillectomy Hosp- resp/ cor pulmonale,  PE  Family History: Last updated: 08/13/2008 Father: MI age 60 Mother:  Siblings: brother with DM- severe   Social History: Last updated: 08/19/2009 Marital Status: Married Children: 1 son Occupation: Scientist, physiological smoker- quit   Risk  Factors: Smoking Status: quit (02/04/2008)  Review of Systems General:  Complains of chills and fatigue; denies malaise. Eyes:  Denies blurring and discharge. ENT:  Complains of nasal congestion, nosebleeds, postnasal drainage, sinus pressure, and sore throat; denies earache. CV:  Denies chest pain or discomfort and palpitations. Resp:  Complains of shortness of breath, sputum productive, and wheezing; denies cough and pleuritic. GI:  Denies abdominal pain, bloody stools, change in bowel habits, nausea, and vomiting. GU:  Denies dysuria, hematuria, and urinary frequency. MS:  Complains of low back pain and stiffness; denies muscle weakness. Derm:  Complains of rash; denies lesion(s) and poor wound healing; occ yeast heat rash under breasts - but not present today. Neuro:  Denies numbness and tingling. Psych:  Denies anxiety and depression. Heme:  Denies abnormal bruising and bleeding.  Physical Exam  General:  morbidly obese and in wheelchair  Head:  normocephalic, atraumatic, and no abnormalities observed.  all sinus areas tender esp L maxillary Eyes:  vision grossly intact, pupils equal, pupils round, and pupils reactive to light.  no conjunctival pallor, injection or icterus  Ears:  R ear normal and L ear normal.   Nose:  nares are congested and injected Mouth:  pharynx pink and moist.   Neck:  supple with full rom and no masses or thyromegally, no JVD  or carotid bruit  Chest Wall:  No deformities, masses, or tenderness noted. Lungs:  difffusely distant bs no rales or crackles today  mildly prolonged exp phase Heart:  RRR, no M or gallop  Abdomen:  soft and non-tender.   Msk:  No deformity or scoliosis noted of thoracic or lumbar spine.   Pulses:  plus one pedal pulses Extremities:  trace left pedal edema and trace right pedal edema.   Neurologic:  sensation intact to light touch, gait normal, and DTRs symmetrical and normal.   Skin:  Intact without suspicious lesions or  rashes Cervical Nodes:  No lymphadenopathy noted Inguinal Nodes:  No significant adenopathy Psych:  normal affect, talkative and pleasant   Diabetes Management Exam:    Foot Exam (with socks and/or shoes not present):       Sensory-Pinprick/Light touch:          Left medial foot (L-4): normal          Left dorsal foot (L-5): normal          Left lateral foot (S-1): normal          Right medial foot (L-4): normal          Right dorsal foot (L-5): normal          Right lateral foot (S-1): normal       Sensory-Monofilament:          Left foot: normal          Right foot: normal       Inspection:          Left foot: normal          Right foot: normal       Nails:          Left foot: normal          Right foot: normal   Impression & Recommendations:  Problem # 1:  SINUSITIS - ACUTE-NOS (ICD-461.9) Assessment New  will tx with augmentin and update recommend sympt care- see pt instructions   pt advised to update me if symptoms worsen or do not improve  Her updated medication list for this problem includes:    Nasonex 50 Mcg/act Susp (Mometasone furoate) .Marland Kitchen... 1 spray each nostril once daily    Augmentin 875-125 Mg Tabs (Amoxicillin-pot clavulanate) .Marland Kitchen... 1 by mouth two times a day for 10 days  Orders: Prescription Created Electronically [id number])  Problem # 2:  INTERTRIGO (ICD-695.89) Assessment: Unchanged  under breasts from heat and obesity refilled her topical tx  urged to keep the area as clean and dry as possible  Orders: Prescription Created Electronically [id number])  Problem # 3:  DIABETES MELLITUS, MILD (ICD-250.00) Assessment: Unchanged  labs today disc healthy diet (low simple sugar/ choose complex carbs/ low sat fat) diet and exercise in detail  disc again imp of wt loss  Orders: T-Renal Function Panel [id number]) T- Hemoglobin A1C ([id number]) T-TSH ([id number]) Venipuncture ([id number]) Prescription Created Electronically [id number])  Labs Reviewed: Creat:  0.7 (11/22/2009)    Reviewed HgBA1c results: 5.9 (11/22/2009)  6.0 (08/16/2009)  Problem # 4:  HYPOTHYROIDISM (ICD-244.9) Assessment: Improved  check tsh on adj dose clinically pt feels better  Her updated medication list for this problem includes:    Levothroid 125 Mcg Tabs (Levothyroxine sodium) .Marland Kitchen... Take one tablet by mouth once daily  Orders: T-Renal Function Panel [id number]) T- Hemoglobin A1C [id number]) T-TSH [id number]) Venipuncture [id number]) Prescription Created Electronically [id number])  Labs Reviewed: TSH: 7.89 (11/22/2009)    HgBA1c:  5.9 (11/22/2009) Chol: 149 (11/22/2009)   HDL: 48.20 (11/22/2009)   LDL: 66 (11/22/2009)   TG: 175.0 (11/22/2009)  Complete Medication List: 1)  Advair Diskus 250-50 Mcg/dose Misc (Fluticasone-salmeterol) .Marland Kitchen.. 1 inhalation two times a day 2)  Furosemide 80 Mg Tabs (Furosemide) .... Take 1 tablet by mouth two times a day 3)  Omeprazole 20 Mg Cpdr (Omeprazole) .... Take 1 tablet by mouth two times a day 4)  Coumadin 5 Mg Tabs (Warfarin sodium) .... Take as directed by coumadin clinic. 5)  Klor-con 20 Meq Pack (Potassium chloride) .... Take 2 tabs by mouth once daily 6)  Nasonex 50 Mcg/act Susp (Mometasone furoate) .Marland Kitchen.. 1 spray each nostril once daily 7)  Diclofenac Sodium 75 Mg Tbec (Diclofenac sodium) .Marland Kitchen.. 1 by mouth two times a day with food as needed pain 8)  Elocon 0.1 % Crea (Mometasone furoate) .... Tiny amount to each ear canal twice weekly as needed 9)  Ciclopirox Olamine 0.77 % Crea (Ciclopirox olamine) .... Apply to aff area two times a day prn 10)  Diltiazem Hcl Er Beads 120 Mg Xr24h-cap (Diltiazem hcl er beads) .Marland Kitchen.. 1 by mouth once daily 11)  Onetouch Ultra Test Strp (Glucose blood) .... For one touch ultra mini glucometer, to test blood sugar three times a day and as needed 12)  Onetouch Ultrasoft Lancets Misc (Lancets) .... To test blood sugar  three times a day and as needed 13)  Zocor 20 Mg Tabs (Simvastatin) .Marland Kitchen.. 1 by  mouth once daily in evening 14)  Oxygen  .Marland Kitchen.. 3 liters 24/7 15)  Claritin 10 Mg Tabs (Loratadine) .... Otc as directed. 16)  Levothroid 125 Mcg Tabs (Levothyroxine sodium) .... Take one tablet by mouth once daily 17)  Augmentin 875-125 Mg Tabs (Amoxicillin-pot clavulanate) .Marland Kitchen.. 1 by mouth two times a day for 10 days 18)  Diflucan 150 Mg Tabs (Fluconazole) .Marland Kitchen.. 1 by mouth times one as needed yeast infection  Other Orders: T-Protime, Auto ([id number])  Patient Instructions: 1)  labs today for thyroid and sugar  2)  will include protime in those labs to send to the [facility] clinic  3)  watch diet - get back to weight watchers and be as active as you can in air conditioning  4)  take the augmentin for sinus infection (diflucan for yeast if needed) 5)  you can use mucinex and nasal saline spray for congestion  6)  update me if not improved next week  Prescriptions: DIFLUCAN 150 MG TABS (FLUCONAZOLE) 1 by mouth times one as needed yeast infection  #1 x 0   Entered and Authorized by:   Judith Part MD   Signed by:   Judith Part MD on 04/21/2010   Method used:   Electronically to        [facility] [address] [id number] (retail)       [address]       [place], [place]  [address]       Ph: [phone]       Fax: [phone]   RxID:   [id number] AUGMENTIN 875-125 MG TABS (AMOXICILLIN-POT CLAVULANATE) 1 by mouth two times a day for 10 days  #20 x 0   Entered and Authorized by:   Judith Part MD   Signed by:   Judith Part MD on 04/21/2010   Method used:   [facility]        [facility]  [address] [facility]  [address] (retail)       [address]       [place], [place]  [address]       Ph: [phone]       Fax: [phone]   RxID:   [id number] CICLOPIROX OLAMINE 0.77 % CREA (CICLOPIROX OLAMINE) apply to aff area two times a day prn  #1 medium x 2   Entered and Authorized by:   Judith Part MD   Signed by:   Judith Part MD on  04/21/2010   Method used:   [facility] to        [facility] [address] [id number] ([facility]ail)       [address]       [place], [place]  [address]       Ph: [phone]       Fax: [phone]   RxID:   [id number]   Current Allergies (reviewed today): ! SUDAFED  Laboratory Results   Urine Tests  Date/Time Received: April 21, 2010 4:16 PM  Date/Time Reported: April 21, 2010 4:16 PM   Routine Urinalysis   Color: yellow Appearance: Clear Glucose: negative   (Normal Range: Negative) Bilirubin: negative   (Normal Range: Negative) Ketone: negative   (Normal Range: Negative) Spec. Gravity: 1.020   (Normal Range: 1.003-1.035) Blood: negative   (Normal Range: Negative) pH: 5.0   (Normal Range: 5.0-8.0) Protein: trace   (Normal Range: Negative) Urobilinogen: 0.2   (Normal Range: 0-1) Nitrite: negative   (Normal Range: Negative) Leukocyte Esterace: negative   (Normal Range: Negative)

## 2010-10-31 NOTE — Medication Information (Signed)
Summary: rov/sp  Anticoagulant Therapy  Managed by: Weston Brass, PharmD Referring MD: Dr Tenny Craw PCP: Judith Part MD Supervising MD: Clifton James MD, Cristal Deer Indication 1: Pulmonary embolus (ICD-415.19) Lab Used: [facility] Site: [facility] INR POC 2.1 INR RANGE 2 - 3  Dietary changes: no    Health status changes: no    Bleeding/hemorrhagic complications: no    Recent/future hospitalizations: no    Any changes in medication regimen? yes       Details: metformin x few wks  Recent/future dental: no  Any missed doses?: no       Is patient compliant with meds? yes      Comments: Pt is unable to come up to clinic; INR was checked with pt in car.  Allergies: 1)  ! Sudafed  Anticoagulation Management History:      The patient is taking warfarin and comes in today for a routine follow up visit.  Positive risk factors for bleeding include presence of serious comorbidities.  Negative risk factors for bleeding include an age less than 89 years old.  The bleeding index is 'intermediate risk'.  Positive CHADS2 values include History of Diabetes.  Negative CHADS2 values include Age > 64 years old.  The start date was 08/31/2005.  Her last INR was 1.6 ratio.  Anticoagulation responsible provider: Clifton James MD, Cristal Deer.  INR POC: 2.1.  Cuvette Lot#: [id number].  Exp: 08/2011.    Anticoagulation Management Assessment/Plan:      The patient's current anticoagulation dose is Coumadin 5 mg  tabs: Take as directed by coumadin clinic..  The target INR is 2 - 3.  The next INR is due 09/14/2010.  Anticoagulation instructions were given to patient.  Results were reviewed/authorized by Weston Brass, PharmD.  She was notified by Hoy Register, PharmD Candidate.         Prior Anticoagulation Instructions: INR 1.6  Take 1 1/2 tablets today then increase dose to 1 tablet every day except 1/2 tablet on Friday.  Recheck INR in 2-3 weeks.   Current Anticoagulation Instructions: INR 2.1 Continue  previous dose of 1 tablet everyday except 0.5 tablet on Friday Recheck INR in 4 weeks

## 2010-10-31 NOTE — Letter (Signed)
Summary: CMN for CPAP/[facility]  CMN for CPAP/[facility]   Imported By: Lanelle Bal 02/09/2010 08:29:24  _____________________________________________________________________  External Attachment:    Type:   Image     Comment:   External Document

## 2010-10-31 NOTE — Medication Information (Signed)
Summary: [facility]  Anticoagulant Therapy  Managed by: Bethena Midget, RN, BSN Referring MD: Dr Tenny Craw PCP: Judith Part MD Supervising MD: Juanda Chance MD, Jep Dyas Indication 1: Pulmonary embolus (ICD-415.19) Lab Used: [facility] Site: [facility] INR POC 2.0 INR RANGE 2 - 3  Dietary changes: no    Health status changes: no    Bleeding/hemorrhagic complications: no    Recent/future hospitalizations: no    Any changes in medication regimen? no    Recent/future dental: no  Any missed doses?: no       Is patient compliant with meds? yes       Allergies: 1)  ! Sudafed  Anticoagulation Management History:      The patient is taking warfarin and comes in today for a routine follow up visit.  Positive risk factors for bleeding include presence of serious comorbidities.  Negative risk factors for bleeding include an age less than 11 years old.  The bleeding index is 'intermediate risk'.  Positive CHADS2 values include History of Diabetes.  Negative CHADS2 values include Age > 45 years old.  The start date was 08/31/2005.  Her last INR was 2.6 RATIO.  Anticoagulation responsible provider: Juanda Chance MD, Smitty Cords.  INR POC: 2.0.  Cuvette Lot#: [id number].  Exp: 03/2011.    Anticoagulation Management Assessment/Plan:      The patient's current anticoagulation dose is Coumadin 5 mg  tabs: Take as directed by coumadin clinic..  The target INR is 2 - 3.  The next INR is due 02/24/2010.  Anticoagulation instructions were given to patient.  Results were reviewed/authorized by Bethena Midget, RN, BSN.  She was notified by Bethena Midget, RN, BSN.         Prior Anticoagulation Instructions: INR 2.1  Continue 0.5 tab each Monday, Wednesday, Friday and 1 tab on all other days.  Recheck in 4 weeks.    Current Anticoagulation Instructions: INR 2.0 Continue 5mg s everyday except 2.5mg s on Mondays, Wednesdays and Fridays. Recheck in 4 weeks.  Prescriptions: COUMADIN 5 MG  TABS (WARFARIN SODIUM) Take as directed by  coumadin clinic.  #30 x 3   Entered by:   Bethena Midget, RN, BSN   Authorized by:   Sherrill Raring, MD, [facility]   Signed by:   Bethena Midget, RN, BSN on 01/27/2010   Method used:   Electronically to        [facility] [phone]* (retail)       [address]       [place], [place]  [address]       Ph: [phone]       Fax: [phone]   RxID:   [id number]

## 2010-10-31 NOTE — Assessment & Plan Note (Signed)
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 thirst  has not had an eye exam - can not get one until she can afford it   uses the ciclopirox cream -- for intertrigo  30 g  tube needs refil- this helps a lot    Allergies: 1)  ! Sudafed  Past History:  Past Medical History: Last updated: 09/30/2009 GERD Pulmonary embolism, hx of--on coumadin morbid obesity reactive airways cor pulmonale, and obesity hypoventilation syndrome hypothyroidism OA- widespread 2010 a fib- with cardioversion/successful   Echo 11/03/2008  -  Abnormal septal motion Overall left ventricular systolic function         was normal. Left ventricular ejection fraction was estimated         to be 55 %.   -  The aortic valve was mildly calcified.    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 Eyes:  Denies blurring and eye irritation. CV:  Complains of shortness of breath with exertion and swelling of feet; denies chest pain or discomfort, lightheadness, and palpitations. Resp:  Denies cough and wheezing. GI:  Denies abdominal pain, change in bowel habits, and indigestion. GU:  Denies urinary frequency. MS:  Complains of joint pain and stiffness; denies muscle aches and cramps. Derm:  Denies itching, lesion(s), poor wound healing, and rash. Neuro:  Denies headaches, numbness, and tingling. Psych:  mood is overall fair . Endo:  Denies cold intolerance, excessive thirst, excessive urination, and heat intolerance. Heme:  Denies abnormal bruising and bleeding.  Physical Exam  General:  morbidly obese in wheelchair with oxygen Head:  normocephalic, atraumatic, and no abnormalities observed.   Eyes:  vision grossly intact, pupils equal, pupils round, and pupils reactive to light.  no conjunctival pallor, injection or icterus  Ears:  R ear normal and L ear normal.   Nose:  no nasal discharge.   Mouth:  pharynx pink and moist.  Neck:  supple with full rom and no masses or thyromegally, no JVD or carotid bruit  Chest Wall:  No deformities, masses, or tenderness noted. Lungs:  difffusely distant bs no rales or crackles today  mildly prolonged exp phase Heart:  RRR, no M or gallop  Abdomen:  soft, non-tender, and normal bowel sounds.   Pulses:  plus one pedal pulses Extremities:  trace left pedal edema and trace right pedal edema.   Neurologic:  sensation intact to light touch, gait normal, and DTRs symmetrical and normal.   to tremor or rigidity Skin:  Intact without suspicious  lesions or rashes intertrigo is in control today Cervical Nodes:  No lymphadenopathy noted Inguinal Nodes:  No significant adenopathy Psych:  normal affect, talkative and pleasant   Diabetes Management Exam:    Foot Exam (with socks and/or shoes not present):       Sensory-Pinprick/Light touch:          Left medial foot (L-4): normal          Left dorsal foot (L-5): normal          Left lateral foot (S-1): normal          Right medial foot (L-4): normal          Right dorsal foot (L-5): normal          Right lateral foot (S-1): normal       Sensory-Monofilament:          Left foot: normal          Right foot: normal       Inspection:          Left foot: normal          Right foot: normal       Nails:          Left foot: normal          Right foot: normal   Impression & Recommendations:  Problem # 1:  INTERTRIGO (ZOX-096.04) Assessment Unchanged refilled her ciclopirox cream- works well hope for imp as DM imp  Problem # 2:  DIABETES MELLITUS, MILD (ICD-250.00) Assessment: Deteriorated  this is worse-- too many fruits and carbs pt promised to go back to DM diet  will start metformin and watch closely check gluc once daily- see inst unable to afford eye exam at this time foot care is good plan lab in 3 mo and f/u Her updated medication list for this problem includes:    Glucophage 500 Mg Tabs (Metformin hcl) .Marland Kitchen... 1 by mouth two times a day  Orders: Prescription Created Electronically [phone])  Problem # 3:  HYPOTHYROIDISM (ICD-244.9) Assessment: Unchanged  stable with no clinical change no dose change Her updated medication list for this problem includes:    Levothroid 137 Mcg Tabs (Levothyroxine sodium) .Marland Kitchen... 1 by mouth once daily  Labs Reviewed: TSH: 4.08 (07/26/2010)    HgBA1c: 7.5 (07/26/2010) Chol: 149 (11/22/2009)   HDL: 48.20 (11/22/2009)   LDL: 66 (11/22/2009)   TG: 175.0 (11/22/2009)  Problem # 4:  HYPERPARATHYROIDISM UNSPECIFIED  (ICD-252.00) Assessment: Unchanged hyperparathyroid with hypercalcemia and low phos  pt will eventually need parathyroidectomy -- likely  she cannot afford return to endo or surgeon appt feels fine  disc risk of OP  will check vit D with next lab and she will let me know when financially able to return to specialist   Complete Medication List: 1)  Advair Diskus 250-50 Mcg/dose Misc (Fluticasone-salmeterol) .Marland Kitchen.. 1 inhalation two  times a day 2)  Furosemide 80 Mg Tabs (Furosemide) .... Take 1 tablet by mouth two times a day 3)  Omeprazole 20 Mg Cpdr (Omeprazole) .... Take 1 tablet by mouth two times a day 4)  Coumadin 5 Mg Tabs (Warfarin sodium) .... Take as directed by coumadin clinic. 5)  Klor-con 20 Meq Pack (Potassium chloride) .... Take 2 tabs by mouth once daily 6)  Nasonex 50 Mcg/act Susp (Mometasone furoate) .Marland Kitchen.. 1 spray each nostril once daily 7)  Diclofenac Sodium 75 Mg Tbec (Diclofenac sodium) .Marland Kitchen.. 1 by mouth two times a day with food as needed pain 8)  Elocon 0.1 % Crea (Mometasone furoate) .... Tiny amount to each ear canal twice weekly as needed 9)  Ciclopirox Olamine 0.77 % Crea (Ciclopirox olamine) .... Apply to aff area two times a day prn 10)  Diltiazem Hcl Er Beads 120 Mg Xr24h-cap (Diltiazem hcl er beads) .Marland Kitchen.. 1 by mouth once daily 11)  Onetouch Ultra Test Strp (Glucose blood) .... For one touch ultra mini glucometer, to test blood sugar once daily and as needed for dm 250.0 12)  Onetouch Ultrasoft Lancets Misc (Lancets) .... To test blood sugar  once daily and as needed for dm 250.0 13)  Zocor 20 Mg Tabs (Simvastatin) .Marland Kitchen.. 1 by mouth once daily in evening 14)  Oxygen  .Marland Kitchen.. 3 liters 24/7 15)  Claritin 10 Mg Tabs (Loratadine) .... Otc as directed. 16)  Levothroid 137 Mcg Tabs (Levothyroxine sodium) .Marland Kitchen.. 1 by mouth once daily 17)  Multivitamins Tabs (Multiple vitamin) .... Take 1 tablet by mouth once a day 18)  Glucophage 500 Mg Tabs (Metformin hcl) .Marland Kitchen.. 1 by mouth two times  a day  Other Orders: Admin 1st Vaccine (16109) Flu Vaccine 32yrs + [phone])  Patient Instructions: 1)  start metformin 500 mg two times a day  2)  start checking sugar in am on M, W, F and 2 hours after eve meal on T, TH, Sat , Sun  3)  keep a log  4)  goal sugar is 120 in am and 140 after meals  5)  eat a diabetic diet  6)  let me know when you are able to get eye exam / and also follow up for the hyperparathyroidism  7)  schedule fasting labs in 3 months lipid/ast/alt/renal/ AIC / and vit D for 272, 250.0 and hyperparathyroid  8)  then follow up  Prescriptions: GLUCOPHAGE 500 MG TABS (METFORMIN HCL) 1 by mouth two times a day  #60 x 11   Entered and Authorized by:   Judith Part MD   Signed by:   Judith Part MD on 08/03/2010   Method used:   [facility]        [facility] [address] [id number]       [address]       [place], [place]  [address]       Ph: [phone]       Fax: [phone]   RxID:   [id number] CICLOPIROX OLAMINE 0.77 % CREA (CICLOPIROX OLAMINE) apply to aff area two times a day prn  #30g x 3   Entered and Authorized by:   Judith Part MD   Signed by:   Judith Part MD on 08/03/2010   Method used:   [facility]        [facility]  [address] [id number]  [address]       [place], [place]  [address]       Ph: [phone]       Fax: [phone]   RxID:   [id number] [facility] ULTRA TEST  STRP (GLUCOSE BLOOD) for one touch ultra mini glucometer, to test blood sugar once daily and as needed for DM 250.0  #100 x 3   Entered and Authorized by:   Judith Part MD   Signed by:   Judith Part MD on 08/03/2010   Method used:   Print then Give to Patient   RxID:   [id number] [facility] ULTRASOFT LANCETS  MISC (LANCETS) to test blood sugar  once daily and as needed for DM 250.0  #100 x 3   Entered and Authorized by:   Judith Part MD   Signed by:   Judith Part MD on  08/03/2010   Method used:   Print then Give to Patient   RxID:   [id number]    Orders Added: 1)  Admin 1st Vaccine [90471] 2)  Flu Vaccine 60yrs + [01601] 3)  Prescription Created Electronically [G8553] 4)  Est. Patient Level IV [09323]    Current Allergies (reviewed today): ! SUDAFED  Flu Vaccine Consent Questions     Do you have a history of severe allergic reactions to this vaccine? no    Any prior history of allergic reactions to egg and/or gelatin? no    Do you have a sensitivity to the preservative Thimersol? no    Do you have a past history of Guillan-Barre Syndrome? no    Do you currently have an acute febrile illness? no    Have you ever had a severe reaction to latex? no    Vaccine information given and explained to patient? yes    Are you currently pregnant? no    Lot Number:[id number]   Exp Date:03/31/2011   Site Given  Left Deltoid [email or web address].[email or web address] Lewanda Rife LPN  August 03, 2010 9:41 AM

## 2010-11-02 ENCOUNTER — Other Ambulatory Visit: Payer: Self-pay | Admitting: Family Medicine

## 2010-11-02 ENCOUNTER — Encounter: Payer: Self-pay | Admitting: Family Medicine

## 2010-11-02 ENCOUNTER — Other Ambulatory Visit (INDEPENDENT_AMBULATORY_CARE_PROVIDER_SITE_OTHER): Payer: BC Managed Care – PPO

## 2010-11-02 ENCOUNTER — Encounter (INDEPENDENT_AMBULATORY_CARE_PROVIDER_SITE_OTHER): Payer: Self-pay | Admitting: *Deleted

## 2010-11-02 ENCOUNTER — Ambulatory Visit: Admit: 2010-11-02 | Payer: Self-pay | Admitting: Family Medicine

## 2010-11-02 DIAGNOSIS — M5137 Other intervertebral disc degeneration, lumbosacral region: Secondary | ICD-10-CM

## 2010-11-02 DIAGNOSIS — E7800 Pure hypercholesterolemia, unspecified: Secondary | ICD-10-CM

## 2010-11-02 DIAGNOSIS — E119 Type 2 diabetes mellitus without complications: Secondary | ICD-10-CM

## 2010-11-02 DIAGNOSIS — E213 Hyperparathyroidism, unspecified: Secondary | ICD-10-CM

## 2010-11-02 DIAGNOSIS — M51379 Other intervertebral disc degeneration, lumbosacral region without mention of lumbar back pain or lower extremity pain: Secondary | ICD-10-CM

## 2010-11-02 LAB — LIPID PANEL
Cholesterol: 152 mg/dL (ref 0–200)
HDL: 32.7 mg/dL — ABNORMAL LOW (ref >39.00)
LDL Cholesterol: 90 mg/dL (ref 0–99)
VLDL: 29.6 mg/dL (ref 0.0–40.0)

## 2010-11-02 LAB — RENAL FUNCTION PANEL
Albumin: 3.8 g/dL (ref 3.5–5.2)
Creatinine, Ser: 0.8 mg/dL (ref 0.4–1.2)
Glucose, Bld: 178 mg/dL — ABNORMAL HIGH (ref 70–99)
Phosphorus: 1.9 mg/dL — ABNORMAL LOW (ref 2.3–4.6)
Potassium: 4.2 mEq/L (ref 3.5–5.1)

## 2010-11-02 LAB — HEMOGLOBIN A1C: Hgb A1c MFr Bld: 7.2 % — ABNORMAL HIGH (ref 4.6–6.5)

## 2010-11-02 LAB — AST: AST: 25 U/L (ref 0–37)

## 2010-11-02 NOTE — Medication Information (Signed)
Summary: Coumadin Clinic   Anticoagulant Therapy  Managed by: Weston Brass, PharmD Referring MD: Dr Tenny Craw PCP: Judith Part MD Supervising MD: Riley Kill MD, Maisie Fus Indication 1: Pulmonary embolus (ICD-415.19) Lab Used: [facility] Site: [facility] INR POC 2.4 INR RANGE 2 - 3  Dietary changes: no    Health status changes: no    Bleeding/hemorrhagic complications: no    Recent/future hospitalizations: no    Any changes in medication regimen? yes       Details: on doxycycline for infection  Recent/future dental: no  Any missed doses?: no       Is patient compliant with meds? yes       Allergies: 1)  ! Sudafed  Anticoagulation Management History:      The patient is taking warfarin and comes in today for a routine follow up visit.  Positive risk factors for bleeding include presence of serious comorbidities.  Negative risk factors for bleeding include an age less than 60 years old.  The bleeding index is 'intermediate risk'.  Positive CHADS2 values include History of Diabetes.  Negative CHADS2 values include Age > 24 years old.  The start date was 08/31/2005.  Her last INR was 1.6 ratio.  Anticoagulation responsible provider: Riley Kill MD, Maisie Fus.  INR POC: 2.4.  Exp: 11/2011.    Anticoagulation Management Assessment/Plan:      The patient's current anticoagulation dose is Coumadin 5 mg  tabs: Take as directed by coumadin clinic..  The target INR is 2 - 3.  The next INR is due 11/16/2010.  Anticoagulation instructions were given to patient.  Results were reviewed/authorized by Weston Brass, PharmD.  She was notified by Weston Brass PharmD.         Prior Anticoagulation Instructions: INR 2.3 Continue 5mg  daily except 2.5mg s on Fridays. Recheck in 4 weeks.   Current Anticoagulation Instructions: INR 2.4  Continue same dose of 1 tablet every day except 1/2 tablet on Friday.  Recheck INR in 4 weeks.

## 2010-11-02 NOTE — Progress Notes (Signed)
Summary: Diflucan refill  Phone Note Refill Request Call back at [phone] work or home [phone] Message from:  Patient  Refills Requested: Medication #1:  DIFLUCAN 150 MG TABS 1 by mouth times one for yeast infection as needed Pt was seen by Dr [name] on 10/11/10 and given an antibiotic. If pt takes antibiotic she has to take Diflucan also. Dr [name] is out of office today. Can Diflucan be refilled for pt?   Method Requested: Electronic Initial call taken by: Lewanda Rife LPN,  October 12, 2010 8:33 AM  Follow-up for Phone Call        okay to fill diflucan 150mg  by mouth x1, repeat in 1 week if needed.  disp #2, no rf.  I was unable to send rx as note was on hold in the system. Please send it in.  thanks.  Follow-up by: Crawford Givens MD,  October 12, 2010 1:39 PM  Additional Follow-up for Phone Call Additional follow up Details #1::        Med sent [facility] to [facility] as instructed. Patient notified as instructed by telephone. Lewanda Rife LPN  October 12, 2010 3:07 PM     Prescriptions: DIFLUCAN 150 MG TABS (FLUCONAZOLE) 1 by mouth times one for yeast infection as needed  #1 x 1   Entered by:   Lewanda Rife LPN   Authorized by:   Crawford Givens MD   Signed by:   Lewanda Rife LPN on 62/13/0865   Method used:   [facility]        [facility]  [address] [phone][facility] (retail)       [address]       [place], [place]  [address]       Ph: [phone]       Fax: [phone]   RxID:   [id number]

## 2010-11-02 NOTE — Assessment & Plan Note (Signed)
Summary: F/U LEFT LEG/CLE   Vital Signs:  Patient profile:   55 year old female Height:      65 inches Temp:     98 degrees F oral Pulse rate:   72 / minute Pulse rhythm:   regular BP sitting:   124 / 76  (right arm) Cuff size:   large  Vitals Entered By: Lewanda Rife LPN (October 11, 2010 4:02 PM) CC: f/u left leg - no better   History of Present Illness: here for re check of abcess L leg  has had both keflex (also zmax for sinusitis)  the leg got much better and then 3 days later got worse again   it bleeds a bit -- ? if draining pus  is exact same spot no fever  sinus infection is better     Allergies: 1)  ! Sudafed  Past History:  Past Medical History: Last updated: 09/15/2010 GERD hydraadenitis supparativa Pulmonary embolism, hx of--on coumadin morbid obesity reactive airways cor pulmonale, and obesity hypoventilation syndrome hypothyroidism OA- widespread 2010 a fib- with cardioversion/successful   Echo 11/03/2008  -  Abnormal septal motion Overall left ventricular systolic function         was normal. Left ventricular ejection fraction was estimated         to be 55 %.   -  The aortic valve was mildly calcified.    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 Eyes:  Denies blurring and eye irritation. CV:  Denies chest pain or discomfort, palpitations, and shortness of breath with exertion. Resp:  Denies cough, shortness of breath, and wheezing. GI:  Denies abdominal pain, bloody  stools, change in bowel habits, indigestion, and nausea. MS:  Denies joint pain, joint redness, and joint swelling. Derm:  Complains of lesion(s); denies rash. Neuro:  Denies numbness. Heme:  Denies abnormal bruising and bleeding.  Physical Exam  General:  no apparent distress in wheelchair morbidly obese on O2 per routine normocephalic atraumatic mucous membranes moist tm w/o erythema nasal exam: injected epithelium mucous membranes moist w/o exudates L>R max tenderness on exams neck supple no focal decrease in  bs, no increase in wob declined skin exam Head:  normocephalic, atraumatic, and no abnormalities observed.   Mouth:  pharynx pink and moist.   Neck:  supple with full rom and no masses or thyromegally, no JVD or carotid bruit  Lungs:  difffusely distant bs no rales or crackles today  mildly prolonged exp phase Heart:  RRR, no M or gallop  Skin:  L post leg -- area of prev abcess is soft with some ecchymosis (old) as well as 2 small 1cm skin tears  Cervical Nodes:  No lymphadenopathy noted Psych:  normal affect, talkative and pleasant    Impression & Recommendations:  Problem # 1:  CELLULITIS AND ABSCESS OF LEG EXCEPT FOOT (ICD-682.6) Assessment New  area of previous abcess actually looks better - but with some skin tear (possibly from movement) and some bruising  in light of inc pain however - need to cover for mrsa so will px doxy  also bactroban oint and gentle dressings  nothing to culture today keep updated needs early protime on tues in light of abx  The following medications were removed from the medication list:    Keflex 500 Mg Caps (Cephalexin) .Marland Kitchen... 1 by mouth three times a day for 5 days    Zithromax 250 Mg Tabs (Azithromycin) .Marland Kitchen... 2 by mouth today and then 1 by mouth once daily for 4 days. Her updated medication list for this problem includes:    Doxycycline Hyclate 100 Mg Caps (Doxycycline hyclate) .Marland Kitchen... 1 by mouth two times a day for 10  days  Orders: Prescription Created Electronically [phone])  Complete Medication List: 1)  Advair Diskus 250-50 Mcg/dose Misc (Fluticasone-salmeterol) .Marland Kitchen.. 1 inhalation two times a day 2)  Furosemide 80 Mg Tabs (Furosemide) .... Take 1 tablet by mouth two times a day 3)  Omeprazole 20 Mg Cpdr (Omeprazole) .... Take 1 tablet by mouth two times a day 4)  Coumadin 5 Mg Tabs (Warfarin sodium) .... Take as directed by coumadin clinic. 5)  Klor-con 20 Meq Pack (Potassium chloride) .... Take 2 tabs by mouth once daily 6)  Nasonex 50 Mcg/act Susp (Mometasone furoate) .Marland Kitchen.. 1 spray each nostril once daily 7)  Diclofenac Sodium 75 Mg Tbec (Diclofenac sodium) .Marland Kitchen.. 1 by mouth two times a day with food as needed pain 8)  Elocon 0.1 % Crea (Mometasone furoate) .... Tiny amount to each ear canal twice weekly as needed 9)  Ciclopirox Olamine 0.77 % Crea (Ciclopirox olamine) .... Apply to aff area two times a day as  needed 10)  Diltiazem Hcl Er Beads 120 Mg Xr24h-cap (Diltiazem hcl er beads) .Marland Kitchen.. 1 by mouth once daily 11)  Onetouch Ultra Test Strp (Glucose blood) .... For one touch ultra mini glucometer, to test blood sugar once daily and as needed for dm 250.0 12)  Onetouch Ultrasoft Lancets Misc (Lancets) .... To test blood sugar  once daily and as needed for dm 250.0 13)  Zocor 20 Mg Tabs (Simvastatin) .Marland Kitchen.. 1 by mouth once daily in evening 14)  Oxygen  .Marland Kitchen.. 3 liters 24/7 15)  Claritin 10 Mg Tabs (Loratadine) .... Otc as directed. 16)  Levothroid 137 Mcg Tabs (Levothyroxine sodium) .Marland Kitchen.. 1 by mouth once daily 17)  Multivitamins Tabs (Multiple vitamin) .... Take 1 tablet by mouth once a day 18)  Glucophage 500 Mg Tabs (Metformin hcl) .Marland Kitchen.. 1 by mouth two times a day 19)  Diflucan 150 Mg Tabs (Fluconazole) .Marland Kitchen.. 1 by mouth times one for yeast infection as needed 20)  Doxycycline Hyclate 100 Mg Caps (Doxycycline hyclate) .Marland Kitchen.. 1 by mouth two times a day for 10 days 21)  Bactroban 2 % Oint (Mupirocin) .... Apply to  affected area two times a day as needed  Patient Instructions: 1)  take the doxycycline as directed  2)  use bactroban ointment two times a day  3)  dress wound with gauze and gentle paper tape - over the counter  4)  warm compresses are ok  5)  if increase in pain/redness/ drainage or any fever - update me asap  6)  f/u here for a one time PT/INR check on tuesday- please schedule  Prescriptions: BACTROBAN 2 % OINT (MUPIROCIN) apply to affected area two times a day as needed  #1 medium x 0   Entered and Authorized by:   Judith Part MD   Signed by:   Judith Part MD on 10/11/2010   Method used:   [facility]  [address] [id number]       [address]       [place], [place]  [address]       Ph: [phone]       Fax: [phone]   RxID:   [id number] DOXYCYCLINE HYCLATE 100 MG CAPS (DOXYCYCLINE HYCLATE) 1 by mouth two times a day for 10 days  #20 x 0   Entered and Authorized by:   Judith Part MD   Signed by:   Judith Part MD on 10/11/2010   Method used:   [facility] to        [facility]  [address] [id number]       [address]       [place], [place]  [address]       Ph: [phone]       Fax: [phone]   RxID:   [id number]    Orders Added: 1)  Prescription Created Electronically [G8553] 2)  Est. Patient Level III [30160]    Current Allergies (reviewed today): ! SUDAFED

## 2010-11-02 NOTE — Progress Notes (Signed)
Summary: needs more antibiotic?  Phone Note Call from Patient Call back at Work Phone [phone]   Caller: Patient Call For: Judith Part MD Summary of Call: Pt states she was given abx for an infection on her leg.  She says this is better but still swollen and tender.  She asks if she should continue abx for a few more days.  If so, will need refil.  [facility] [address]. Initial call taken by: Lowella Petties CMA, AAMA,  September 21, 2010 2:30 PM  Follow-up for Phone Call        lets extend another 5 days -- then follow up next week so I can re check it  let me know asap if worse or fever px written on EMR for call in  Follow-up by: Judith Part MD,  September 21, 2010 6:31 PM  Additional Follow-up for Phone Call Additional follow up Details #1::        Patient notified as instructed by telephone. Med [facility] as instructed. Pt scheduled f/u appt with Dr [name] 09/29/10 at 3pm.[facility] LPN  September 22, 2010 9:12 AM     New/Updated Medications: KEFLEX 500 MG CAPS (CEPHALEXIN) 1 by mouth three times a day for 5 days Prescriptions: KEFLEX 500 MG CAPS (CEPHALEXIN) 1 by mouth three times a day for 5 days  #15 x 0   Entered by:   Lewanda Rife LPN   Authorized by:   Judith Part MD   Signed by:   Lewanda Rife LPN on 14/78/2956   Method used:   [facility]        [id number]       [address]       [place], [place]  [address]       Ph: [phone]       Fax: [phone]   RxID:   [id number]

## 2010-11-02 NOTE — Medication Information (Signed)
Summary: rov/nb  Anticoagulant Therapy  Managed by: Bethena Midget, RN, BSN Referring MD: Dr Tenny Craw PCP: Judith Part MD Supervising MD: Graciela Husbands MD, Viviann Spare Indication 1: Pulmonary embolus (ICD-415.19) Lab Used: [facility] Site: [facility] INR POC 2.3 INR RANGE 2 - 3  Dietary changes: no    Health status changes: no    Bleeding/hemorrhagic complications: no    Recent/future hospitalizations: no    Any changes in medication regimen? no    Recent/future dental: no  Any missed doses?: no       Is patient compliant with meds? yes       Allergies: 1)  ! Sudafed  Anticoagulation Management History:      The patient is taking warfarin and comes in today for a routine follow up visit.  Positive risk factors for bleeding include presence of serious comorbidities.  Negative risk factors for bleeding include an age less than 26 years old.  The bleeding index is 'intermediate risk'.  Positive CHADS2 values include History of Diabetes.  Negative CHADS2 values include Age > 82 years old.  The start date was 08/31/2005.  Her last INR was 1.6 ratio.  Anticoagulation responsible provider: Graciela Husbands MD, Viviann Spare.  INR POC: 2.3.  Cuvette Lot#: [id number].  Exp: 11/2011.    Anticoagulation Management Assessment/Plan:      The patient's current anticoagulation dose is Coumadin 5 mg  tabs: Take as directed by coumadin clinic..  The target INR is 2 - 3.  The next INR is due 10/19/2010.  Anticoagulation instructions were given to patient.  Results were reviewed/authorized by Bethena Midget, RN, BSN.  She was notified by Bethena Midget, RN, BSN.         Prior Anticoagulation Instructions: INR 2.1 Continue previous dose of 1 tablet everyday except 0.5 tablet on Friday Recheck INR in 4 weeks  Current Anticoagulation Instructions: INR 2.3 Continue 5mg  daily except 2.5mg s on Fridays. Recheck in 4 weeks.

## 2010-11-02 NOTE — Assessment & Plan Note (Signed)
Summary: SINUS SYMPTOMS/ lb   Vital Signs:  Patient profile:   55 year old female Height:      65 inches Weight:      442 pounds BMI:     73.82 O2 Sat:      95 % on 3 L/min Temp:     98.3 degrees F oral Pulse rate:   84 / minute Pulse rhythm:   regular BP sitting:   132 / 82  (left arm) Cuff size:   large  Vitals Entered By: Delilah Shan CMA Duncan Dull) (September 19, 2010 3:49 PM)  O2 Flow:  3 L/min CC: Sinus symptoms   History of Present Illness: Saturday, chest was burning.  ST and cough since then.  Has pressure across face.   No fevers.  Now with bright yellow sputum.  Mult sick people at work.  On keflex for leg, improving.  Declined check on this today.  Coumadin followed at coumadin clinic.    Allergies: 1)  ! Sudafed  Review of Systems       See HPI.  Otherwise negative.    Physical Exam  General:  no apparent distress in wheelchair morbidly obese on O2 per routine normocephalic atraumatic mucous membranes moist tm w/o erythema nasal exam: injected epithelium mucous membranes moist w/o exudates L>R max tenderness on exams neck supple no focal decrease in  bs, no increase in wob declined skin exam   Impression & Recommendations:  Problem # 1:  SINUSITIS - ACUTE-NOS (ICD-461.9) Lungs w/o focal decrease in breath sounds. okay for outpatient follow up.  Start zmax and follow up with coumadin clinic re: INR.  I offered for her to follow up here about this if it would help her.  She'll call coumadin clinic about setting up a recheck sooner than her next scheduled appointment.  She was very clear about this and knows the risk of antibiotics with coumadin.  No bleeding noted by patient.  Her updated medication list for this problem includes:    Nasonex 50 Mcg/act Susp (Mometasone furoate) .Marland Kitchen... 1 spray each nostril once daily    Keflex 500 Mg Caps (Cephalexin) .Marland Kitchen... 1 by mouth three times a day for 7 days    Zithromax 250 Mg Tabs (Azithromycin) .Marland Kitchen... 2 by mouth  today and then 1 by mouth once daily for 4 days.  Orders: Prescription Created Electronically [id number])  Complete Medication List: 1)  Advair Diskus 250-50 Mcg/dose Misc (Fluticasone-salmeterol) .Marland Kitchen.. 1 inhalation two times a day 2)  Furosemide 80 Mg Tabs (Furosemide) .... Take 1 tablet by mouth two times a day 3)  Omeprazole 20 Mg Cpdr (Omeprazole) .... Take 1 tablet by mouth two times a day 4)  Coumadin 5 Mg Tabs (Warfarin sodium) .... Take as directed by coumadin clinic. 5)  Klor-con 20 Meq Pack (Potassium chloride) .... Take 2 tabs by mouth once daily 6)  Nasonex 50 Mcg/act Susp (Mometasone furoate) .Marland Kitchen.. 1 spray each nostril once daily 7)  Diclofenac Sodium 75 Mg Tbec (Diclofenac sodium) .Marland Kitchen.. 1 by mouth two times a day with food as needed pain 8)  Elocon 0.1 % Crea (Mometasone furoate) .... Tiny amount to each ear canal twice weekly as needed 9)  Ciclopirox Olamine 0.77 % Crea (Ciclopirox olamine) .... Apply to aff area two times a day as  needed 10)  Diltiazem Hcl Er Beads 120 Mg Xr24h-cap (Diltiazem hcl er beads) .Marland Kitchen.. 1 by mouth once daily 11)  Onetouch Ultra Test Strp (Glucose blood) .... For one touch ultra  mini glucometer, to test blood sugar once daily and as needed for dm 250.0 12)  Onetouch Ultrasoft Lancets Misc (Lancets) .... To test blood sugar  once daily and as needed for dm 250.0 13)  Zocor 20 Mg Tabs (Simvastatin) .Marland Kitchen.. 1 by mouth once daily in evening 14)  Oxygen  .Marland Kitchen.. 3 liters 24/7 15)  Claritin 10 Mg Tabs (Loratadine) .... Otc as directed. 16)  Levothroid 137 Mcg Tabs (Levothyroxine sodium) .Marland Kitchen.. 1 by mouth once daily 17)  Multivitamins Tabs (Multiple vitamin) .... Take 1 tablet by mouth once a day 18)  Glucophage 500 Mg Tabs (Metformin hcl) .Marland Kitchen.. 1 by mouth two times a day 19)  Keflex 500 Mg Caps (Cephalexin) .Marland Kitchen.. 1 by mouth three times a day for 7 days 20)  Diflucan 150 Mg Tabs (Fluconazole) .Marland Kitchen.. 1 by mouth times one for yeast infection as needed 21)  Zithromax 250 Mg Tabs  (Azithromycin) .... 2 by mouth today and then 1 by mouth once daily for 4 days.  Patient Instructions: 1)  Start the antibiotics today.  Use the nasal saline and start back on the nasonex in a few days.  Call the coumadin clinic and see if they want you to change your dose of coumadin in the meantime and when they would like to see you back.  If they want you to have it checked here, let us know and we'll help you out.  Take care.  Prescriptions: ZITHROMAX 250 MG TABS (AZITHROMYCIN) 2 by mouth today and then 1 by mouth once daily for 4 days.  #6 x 0   Entered and Authorized by:   Crawford Givens MD   Signed by:   Crawford Givens MD on 09/19/2010   Method used:   [facility]        [facility] [address] [phone][facility]ail[facility]       [address]       [place], [place]  [address]       Ph: [phone]       Fax: [phone]   RxID:   [id number]    Orders Added: 1)  Est. Patient Level III [956[address]] 2)  Prescription Created Electronically [id number]    Current Allergies (reviewed today): ! SUDAFED

## 2010-11-02 NOTE — Assessment & Plan Note (Signed)
Summary: SORE ON LEFT THIGH,LEG SWOLLEN/CLE   Vital Signs:  Patient profile:   55 year old female Height:      65 inches Temp:     97.9 degrees F oral Pulse rate:   76 / minute Pulse rhythm:   regular BP sitting:   126 / 70  (left arm) Cuff size:   large  Vitals Entered By: Lewanda Rife LPN (September 15, 2010 9:30 AM) CC: sore on left thigh, and leg feels swollen   History of Present Illness: had a boil on L thigh is sore- pants have rubbed it  leg is swollen   has drained a lot    has had these all her life  gets them on breast/ under arms -- etc       Allergies: 1)  ! 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 Left ventricular ejection fraction was estimated         to be 55 %.   -  The aortic valve was mildly calcified.    pulm----Dr Clance  Review of Systems General:  Complains of fatigue; denies chills, fever, loss of appetite, and malaise. Eyes:  Denies blurring and eye irritation. CV:  Denies chest pain or discomfort, palpitations, and swelling of hands. Resp:  Denies cough, shortness of breath, and wheezing. GI:  Denies abdominal pain, change in bowel habits, and indigestion. MS:  Complains of joint  pain. Derm:  Complains of poor wound healing; denies itching, lesion(s), and rash. Neuro:  Denies numbness. Heme:  Denies abnormal bruising and bleeding.  Physical Exam  General:  morbidly obese in wheelchair with oxygen Mouth:  pharynx pink and moist.   Neck:  supple with full rom and no masses or thyromegally, no JVD or carotid bruit  Heart:  RRR, no M or gallop  Msk:  No deformity or scoliosis noted of thoracic or lumbar spine.   Extremities:  trace left pedal edema and trace right pedal edema.   Skin:  2-3 cm area of induration and erythema on post L thigh with healed scab in middle not fluctuant, but generally soft  no drainage Psych:  normal affect, talkative and pleasant    Impression & Recommendations:  Problem # 1:  CELLULITIS AND ABSCESS OF LEG EXCEPT FOOT (ICD-682.6) Assessment New  cellulitis in prior area of hydraadenitis with resolving abcess on L post thigh tx with keflex three times a day for 7 d disc wound/ hygiene and care if worse/fever - will call asap Her updated medication list for this problem includes:    Keflex 500 Mg Caps (Cephalexin) .Marland Kitchen... 1 by mouth three times a day for 7 days  Orders: Prescription Created Electronically [phone])  Complete Medication List: 1)  Advair Diskus 250-50 Mcg/dose Misc (Fluticasone-salmeterol) .Marland Kitchen.. 1 inhalation two times a day 2)  Furosemide 80 Mg Tabs (Furosemide) .Marland KitchenMarland KitchenMarland Kitchen  Take 1 tablet by mouth two times a day 3)  Omeprazole 20 Mg Cpdr (Omeprazole) .... Take 1 tablet by mouth two times a day 4)  Coumadin 5 Mg Tabs (Warfarin sodium) .... Take as directed by coumadin clinic. 5)  Klor-con 20 Meq Pack (Potassium chloride) .... Take 2 tabs by mouth once daily 6)  Nasonex 50 Mcg/act Susp (Mometasone furoate) .Marland Kitchen.. 1 spray each nostril once daily 7)  Diclofenac Sodium 75 Mg Tbec (Diclofenac sodium) .Marland Kitchen.. 1 by mouth two times a day with food as needed pain 8)  Elocon 0.1 % Crea (Mometasone furoate) .... Tiny amount to each ear canal  twice weekly as needed 9)  Ciclopirox Olamine 0.77 % Crea (Ciclopirox olamine) .... Apply to aff area two times a day as  needed 10)  Diltiazem Hcl Er Beads 120 Mg Xr24h-cap (Diltiazem hcl er beads) .Marland Kitchen.. 1 by mouth once daily 11)  Onetouch Ultra Test Strp (Glucose blood) .... For one touch ultra mini glucometer, to test blood sugar once daily and as needed for dm 250.0 12)  Onetouch Ultrasoft Lancets Misc (Lancets) .... To test blood sugar  once daily and as needed for dm 250.0 13)  Zocor 20 Mg Tabs (Simvastatin) .Marland Kitchen.. 1 by mouth once daily in evening 14)  Oxygen  .Marland Kitchen.. 3 liters 24/7 15)  Claritin 10 Mg Tabs (Loratadine) .... Otc as directed. 16)  Levothroid 137 Mcg Tabs (Levothyroxine sodium) .Marland Kitchen.. 1 by mouth once daily 17)  Multivitamins Tabs (Multiple vitamin) .... Take 1 tablet by mouth once a day 18)  Glucophage 500 Mg Tabs (Metformin hcl) .Marland Kitchen.. 1 by mouth two times a day 19)  Keflex 500 Mg Caps (Cephalexin) .Marland Kitchen.. 1 by mouth three times a day for 7 days 20)  Diflucan 150 Mg Tabs (Fluconazole) .Marland Kitchen.. 1 by mouth times one for yeast infection as needed  Patient Instructions: 1)  keep area clean with antibacterial soap and water  2)  warm compress is helpful too  3)  take the kefex as directed  4)  if you have to take the diflucan for yeast -- make early appt with coumadin clinic 5)  if not improving or any fever or worse- please alert me Prescriptions: DIFLUCAN 150 MG TABS (FLUCONAZOLE) 1 by mouth times one for yeast infection as needed  #1 x 0   Entered and Authorized by:   Judith Part MD   Signed by:   Judith Part MD on 09/15/2010   Method used:   [facility] to        [id number]       [address]       [place], [place]  [address]       Ph: [phone]       Fax: [phone]   RxID:   [id number] KEFLEX 500 MG CAPS (CEPHALEXIN) 1 by mouth three times a day for 7 days  #21 x 0   Entered and Authorized by:   Judith Part  MD   Signed by:   Judith Part MD on 09/15/2010   Method used:   [facility]ronically to        [facility] [address] [facility]       [address]       [place], [place]  [address]       Ph: [phone]  Fax: [phone]   RxID:   [id number]    Orders Added: 1)  Prescription Created Electronically [[id number]] 2)  Est. Patient Level III [[address]]    Current Allergies (reviewed today): ! SUDAFED

## 2010-11-05 LAB — CONVERTED CEMR LAB
PTH: 365.3 pg/mL — ABNORMAL HIGH (ref 14.0–72.0)
Vit D, 25-Hydroxy: 23 ng/mL — ABNORMAL LOW (ref 30–89)

## 2010-11-08 ENCOUNTER — Ambulatory Visit: Payer: Self-pay | Admitting: Family Medicine

## 2010-11-08 NOTE — Progress Notes (Signed)
----   Converted from flag ---- ---- 10/29/2010 8:48 PM, Judith Part MD wrote: go ahead and check pth thanks   ---- 10/27/2010 10:02 AM, Liane Comber CMA (AAMA) wrote: Peri Jefferson Morning! This pt has labs next week, per 08/03/10 visit do you want a PTH drawn on her or are we just using hyperparathyroid as a dx code? I just wanted to clarify Thanks Tasha ------------------------------

## 2010-11-15 ENCOUNTER — Ambulatory Visit (INDEPENDENT_AMBULATORY_CARE_PROVIDER_SITE_OTHER): Payer: BC Managed Care – PPO | Admitting: Family Medicine

## 2010-11-15 ENCOUNTER — Encounter: Payer: Self-pay | Admitting: Family Medicine

## 2010-11-15 DIAGNOSIS — E119 Type 2 diabetes mellitus without complications: Secondary | ICD-10-CM

## 2010-11-15 DIAGNOSIS — E559 Vitamin D deficiency, unspecified: Secondary | ICD-10-CM | POA: Insufficient documentation

## 2010-11-15 DIAGNOSIS — E213 Hyperparathyroidism, unspecified: Secondary | ICD-10-CM

## 2010-11-15 DIAGNOSIS — Z8679 Personal history of other diseases of the circulatory system: Secondary | ICD-10-CM

## 2010-11-15 DIAGNOSIS — E7800 Pure hypercholesterolemia, unspecified: Secondary | ICD-10-CM

## 2010-11-22 NOTE — Letter (Signed)
Summary: Generic Letter  [place] [facility]  [address] [place], [place] [address]   Phone: [phone]  Fax: [phone]    11/15/2010  [facility] [address] [place], [place]  [address]  [place],   To whom it may concern,    My patient Amy Bryant is chronically ill with severe lung disease and diabetes as well as a history of blood clots.  She is also wheelchair bound.  For this reason I do not think she is medically able to do jury duty.  Thank you.    Sincerely,   Roxy Manns MD

## 2010-11-23 DIAGNOSIS — I2699 Other pulmonary embolism without acute cor pulmonale: Secondary | ICD-10-CM

## 2010-11-23 DIAGNOSIS — I4891 Unspecified atrial fibrillation: Secondary | ICD-10-CM | POA: Insufficient documentation

## 2010-11-23 DIAGNOSIS — Z8679 Personal history of other diseases of the circulatory system: Secondary | ICD-10-CM

## 2010-11-23 DIAGNOSIS — Z86711 Personal history of pulmonary embolism: Secondary | ICD-10-CM | POA: Insufficient documentation

## 2010-11-23 DIAGNOSIS — Z86718 Personal history of other venous thrombosis and embolism: Secondary | ICD-10-CM

## 2010-11-28 NOTE — Assessment & Plan Note (Signed)
Summary: 3 MONTH FOLLOW-UP   Vital Signs:  Patient profile:   55 year old female Height:      65 inches Weight:      442 pounds BMI:     73.82 Temp:     98.4 degrees F oral Pulse rate:   88 / minute Pulse rhythm:   regular BP sitting:   116 / 72  (left arm) Cuff size:   large  Vitals Entered By: Lewanda Rife LPN (November 15, 2010 4:04 PM) CC: threee month f/u after labs   History of Present Illness: here for f/u of hyperparathyroiism and low vit D and high ca and lipids and DM  feels fine - nothing new   on metformin  wt stable  bp 116/72 AIC down from 7.5 to 7.2 - going the right direction no problems with the metformin  sugars have been high in ams 170s , in afternoon 140-155 is eating a diabetic diet some days and some days not  is eating small amounts of fruits  too much corn  too much pasta  too much bread   has to stay with the same amt of greens each day    she has nasal allergies - worse lately with early pollen breathing is farily stable, however    bp good at 116/70 today  no problems with that  parathyroid labs are stable not having symptoms overall  no cramps /tremor  she absolutely cannot afford endo visit or surgeon consult and does not wish to pursue it yet  also found D level to be low - pt not taking any currently    Allergies: 1)  ! Sudafed  Past History:  Past Medical History: Last updated: 09/15/2010 GERD hydraadenitis supparativa Pulmonary embolism, hx of--on coumadin morbid obesity reactive airways cor pulmonale, and obesity hypoventilation syndrome hypothyroidism OA- widespread 2010 a fib- with cardioversion/successful   Echo 11/03/2008  -  Abnormal septal motion Overall left ventricular systolic function         was normal. Left ventricular ejection fraction was estimated         to be 55 %.   -  The aortic valve was mildly calcified.    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 Eyes:  Denies blurring and eye irritation. ENT:  Complains of nasal congestion and postnasal drainage. CV:  Denies chest pain or discomfort, lightheadness, and palpitations. Resp:  Complains of shortness of breath; denies cough and wheezing. GI:  Denies change in bowel habits, indigestion, nausea, and vomiting. GU:  Denies urinary frequency. MS:  Denies muscle aches and cramps. Derm:  Denies itching, lesion(s), poor wound healing, and rash. Neuro:  Denies headaches, numbness, and tingling. Psych:  mood is fair . Endo:  Denies excessive thirst and excessive urination. Heme:  Denies abnormal bruising and bleeding.  Physical Exam  General:  morbidly obese and wheelchair bound  Head:  normocephalic, atraumatic, and no abnormalities observed.   Eyes:  vision grossly intact, pupils equal, pupils round, and pupils reactive to light.  no conjunctival pallor, injection or icterus  Mouth:  pharynx pink and moist.   Neck:  supple with full rom and no masses or thyromegally, no JVD or carotid bruit  Chest Wall:  No deformities, masses, or tenderness noted. Lungs:  difffusely distant bs no rales or crackles today  mildly prolonged exp phase Heart:  RRR, no M or gallop  Abdomen:  soft, non-tender, normal bowel sounds, and no distention.  no renal bruits  Msk:  No deformity or scoliosis noted of thoracic or lumbar spine.  no new joint changes no cramps or faciculations of muscle  Pulses:  plus one pedal pulses Extremities:  trace left pedal edema and  trace right pedal edema.   Neurologic:  sensation intact to light touch, gait normal, and DTRs symmetrical and normal.  no tremor  Skin:  Intact without suspicious lesions or rashes Cervical Nodes:  No lymphadenopathy noted Psych:  normal affect, talkative and pleasant   Diabetes Management Exam:    Foot Exam (with socks and/or shoes not present):       Sensory-Pinprick/Light touch:          Left medial foot (L-4): normal          Left dorsal foot (L-5): normal          Left lateral foot (S-1): normal          Right medial foot (L-4): normal          Right dorsal foot (L-5): normal          Right lateral foot (S-1): normal       Sensory-Monofilament:          Left foot: normal          Right foot: normal       Inspection:          Left foot: normal          Right foot: normal       Nails:          Left foot: normal          Right foot: normal   Impression & Recommendations:  Problem # 1:  DIABETES MELLITUS, MILD (ICD-250.00) Assessment Improved  this is gradually improving with metformin so inc further to 1000 mg two times a day  adv to update if side eff or problems  rev diet- needs to be more compliant and also work on wt loss  tricky- as exercise is limited  Her updated medication list for this problem includes:    Metformin Hcl 1000 Mg Tabs (Metformin hcl) .Marland Kitchen... 1 by mouth two times a day  Labs Reviewed: Creat: 0.8 (11/02/2010)    Reviewed HgBA1c results: 7.2 (11/02/2010)  7.5 (07/26/2010)  Orders: Prescription Created Electronically [phone])  Problem # 2:  HYPERPARATHYROIDISM UNSPECIFIED (ICD-252.00) Assessment: Unchanged  this is essentially stable and asymptomatic  pt cannot afford endo f/u or surgeon consult at this time and chooses not to treat  will tx low vitD to see if that helps  rev labs with pt in detail  Orders: Prescription Created Electronically [id number])  Problem # 3:  UNSPECIFIED VITAMIN D DEFICIENCY (ICD-268.9) Assessment: New  will tx  this with px and re check at f/u  ? if will help parathyroid numbers and overall wellbeing and bone health rev labs with pt   Orders: Prescription Created Electronically [phone])  Complete Medication List: 1)  Advair Diskus 250-50 Mcg/dose Misc (Fluticasone-salmeterol) .Marland Kitchen.. 1 inhalation two times a day 2)  Furosemide 80 Mg Tabs (Furosemide) .... Take 1  tablet by mouth two times a day 3)  Omeprazole 20 Mg Cpdr (Omeprazole) .... Take 1 tablet by mouth two times a day 4)  Coumadin 5 Mg Tabs (Warfarin sodium) .... Take as directed by coumadin clinic. 5)  Klor-con 20 Meq Pack (Potassium chloride) .... Take 2 tabs by mouth once daily 6)  Nasonex 50 Mcg/act Susp (Mometasone furoate) .Marland Kitchen.. 1 spray each nostril once daily 7)  Diclofenac Sodium 75 Mg Tbec (Diclofenac sodium) .Marland Kitchen.. 1 by mouth two times a day with food as needed pain 8)  Elocon 0.1 % Crea (Mometasone furoate) .... Tiny amount to each ear canal twice weekly as needed 9)  Ciclopirox Olamine 0.77 % Crea (Ciclopirox olamine) .... Apply to aff area two times a day as  needed 10)  Diltiazem Hcl Er Beads 120 Mg Xr24h-cap (Diltiazem hcl er beads) .Marland Kitchen.. 1 by mouth once daily 11)  Onetouch Ultra Test Strp (Glucose blood) .... For one touch ultra mini glucometer, to test blood sugar once daily and as needed for dm 250.0 12)  Onetouch Ultrasoft Lancets Misc (Lancets) .... To test blood sugar  once daily and as needed for dm 250.0 13)  Zocor 20 Mg Tabs (Simvastatin) .Marland Kitchen.. 1 by mouth once daily in evening 14)  Oxygen  .Marland Kitchen.. 3 liters 24/7 15)  Claritin 10 Mg Tabs (Loratadine) .... Otc as directed. 16)  Levothroid 137 Mcg Tabs (Levothyroxine sodium) .Marland Kitchen.. 1 by mouth once daily 17)  Multivitamins Tabs (Multiple vitamin) .... Take 1 tablet by mouth once a day 18)  Metformin Hcl 1000 Mg Tabs (Metformin hcl) .Marland Kitchen.. 1 by mouth two times a day 19)  Bactroban 2 % Oint (Mupirocin) .... Apply to affected area two times a day as needed 20)  Drisdol 16606 Unit Caps  (Ergocalciferol) .Marland Kitchen.. 1 by mouth once weekly for 12 weeks 21)  Ergocalciferol 50000 Unit Caps (Ergocalciferol) .Marland Kitchen.. 1 by mouth once weekly for 12 weeks  Patient Instructions: 1)  increase metformin from 500 to 1000 mg two times a day  2)  update me if any problems  3)  here is a letter for jury duty  4)  take vitamin D as px for 12 weeks  5)  schedule labs in 3 months and then follow up - AIC/ renal/ lipid/ast/alt/ vit D level   for low vit D and 250.0, 272  Prescriptions: ERGOCALCIFEROL 50000 UNIT CAPS (ERGOCALCIFEROL) 1 by mouth once weekly for 12 weeks  #12 x 0   Entered and Authorized by:   Judith Part MD   Signed by:   Judith Part MD on 11/19/2010   Method used:   [facility] to        [id number]       [address]       [place], [place]  [address]       Ph: [phone]       Fax: [phone]   RxID:   [id number] DILTIAZEM HCL ER BEADS 120 MG XR24H-CAP (DILTIAZEM HCL ER BEADS) 1 by mouth once daily  #30 x 11   Entered and Authorized by:   Judith Part MD   Signed by:   Judith Part MD on 11/15/2010   Method used:   [facility]ron[facility] to        [facility]       [address]  [place], [place]  [address]       Ph: [phone]       Fax: [phone]   RxID:   [id number] ZOCOR 20 MG TABS (SIMVASTATIN) 1 by mouth once daily in evening  #30 x 11   Entered and Authorized by:   Judith Part MD   Signed by:   Judith Part MD on 11/15/2010   Method used:   Electronically to        [facility]       [address]       [place], [place]  [address]       Ph: [phone]       Fax: [phone]   RxID:   [id number] NASONEX 50 MCG/ACT  SUSP (MOMETASONE FUROATE) 1 spray each nostril once daily  #1 mdi x 11   Entered and Authorized by:   Judith Part MD   Signed by:   Judith Part MD on 11/15/2010    Method used:   Electronically to        [facility]       [address]       [place], [place]  [address]       Ph: [phone]       Fax: [phone]   RxID:   [id number] ADVAIR DISKUS 250-50 MCG/DOSE  MISC (FLUTICASONE-SALMETEROL) 1 Inhalation two times a day  #1 diskus x 11   Entered and Authorized by:   Judith Part MD   Signed by:   Judith Part MD on 11/15/2010   Method used:   Electronically to        [facility]       [address]       [place], [place]  [address]       Ph: [phone]       Fax: [phone]   RxID:   [id number] KLOR-CON 20 MEQ  PACK (POTASSIUM CHLORIDE) take 2 tabs by mouth once daily  #60 x 11   Entered and Authorized by:   Judith Part MD   Signed by:   Judith Part MD on 11/15/2010   Method used:   Electronically to        [facility]       [address]       [place], [place]  [address]       Ph: [phone]       Fax: [phone]   RxID:   [id number] FUROSEMIDE 80 MG TABS (FUROSEMIDE) Take 1 tablet by mouth two times a day  #60 x 11   Entered and Authorized by:   Judith Part MD   Signed by:   Judith Part MD on 11/15/2010   Method used:   Electronically to        [facility]       [address]       [place], [place]  [address]       Ph: [phone]       Fax: [phone]   RxID:  [id number] METFORMIN HCL 1000 MG TABS (METFORMIN HCL) 1 by mouth two times a day  #60 x 11   Entered and Authorized by:   Judith Part MD   Signed by:   Judith Part MD on 11/15/2010   Method used:   Electronically to        [facility]       [address]       [place], [place]  [address]       Ph: [phone]       Fax: [phone]   RxID:   [id number] DRISDOL 50000 UNIT CAPS  (ERGOCALCIFEROL) 1 by mouth once weekly for 12 weeks  #12 x 0   Entered and Authorized by:   Judith Part MD   Signed by:   Judith Part MD on 11/15/2010   Method used:   Electronically to        [facility]       [address]       [place], [place]  [address]       Ph: [phone]       Fax: [phone]   RxID:   [id number]    Orders Added: 1)  Est. Patient Level IV [19509] 2)  Prescription Created Electronically [phone]    Current Allergies (reviewed today): ! SUDAFED

## 2010-11-29 ENCOUNTER — Telehealth: Payer: Self-pay | Admitting: Family Medicine

## 2010-12-07 ENCOUNTER — Encounter (INDEPENDENT_AMBULATORY_CARE_PROVIDER_SITE_OTHER): Payer: BC Managed Care – PPO

## 2010-12-07 ENCOUNTER — Encounter: Payer: Self-pay | Admitting: Internal Medicine

## 2010-12-07 DIAGNOSIS — I2699 Other pulmonary embolism without acute cor pulmonale: Secondary | ICD-10-CM

## 2010-12-07 DIAGNOSIS — Z7901 Long term (current) use of anticoagulants: Secondary | ICD-10-CM

## 2010-12-07 LAB — CONVERTED CEMR LAB: POC INR: 2.3

## 2010-12-07 NOTE — Progress Notes (Signed)
Summary: Mupirocin 2% ointment  Phone Note Refill Request Call back at [phone] Message from:  [facility] on November 29, 2010 4:51 PM  Refills Requested: Medication #1:  BACTROBAN 2 % OINT apply to affected area two times a day as needed   Last Refilled: 10/11/2010 [facility] request refill electronically for Mupirocin 2% ointment. Last refill 10/11/10.Please advise.    Method Requested: Electronic Initial call taken by: Lewanda Rife LPN,  November 29, 2010 4:52 PM  Follow-up for Phone Call        px written on EMR for call in  Follow-up by: Judith Part MD,  November 29, 2010 5:19 PM  Additional Follow-up for Phone Call Additional follow up Details #1::        Med [facility] as instructed.Lewanda Rife LPN  November 29, 2010 5:32 PM     Prescriptions: BACTROBAN 2 % OINT (MUPIROCIN) apply to affected area two times a day as needed  #1 medium x 1   Entered by:   Lewanda Rife LPN   Authorized by:   Judith Part MD   Signed by:   Lewanda Rife LPN on 45/40/9811   Method used:   Electronically to        [facility]       [address]       [place], [place]  [address]       Ph: [phone]       Fax: [phone]   RxID:   [id number] BACTROBAN 2 % OINT (MUPIROCIN) apply to affected area two times a day as needed  #1 medium x 1   Entered and Authorized by:   Judith Part MD   Signed by:   Judith Part MD on 11/29/2010   Method used:   Telephoned to ...       [facility]       [address]       [place], [place]  [address]       Ph: [phone]       Fax: [phone]   RxID:   [id number]

## 2010-12-12 NOTE — Medication Information (Signed)
Summary: ROV/SP/GLC/rs per pt xcal;l appt at 4:15-MB  Anticoagulant Therapy  Managed by: Bethena Midget, RN, BSN Referring MD: Dr Tenny Craw PCP: Judith Part MD Supervising MD: Ladona Ridgel MD, Sharlot Gowda Indication 1: Pulmonary embolus (ICD-415.19) Lab Used: [facility] Site: [facility] INR POC 2.3 INR RANGE 2 - 3  Dietary changes: no    Health status changes: no    Bleeding/hemorrhagic complications: no    Recent/future hospitalizations: no    Any changes in medication regimen? no    Recent/future dental: no  Any missed doses?: no       Is patient compliant with meds? yes       Allergies: 1)  ! Sudafed  Anticoagulation Management History:      The patient is taking warfarin and comes in today for a routine follow up visit.  Positive risk factors for bleeding include presence of serious comorbidities.  Negative risk factors for bleeding include an age less than 82 years old.  The bleeding index is 'intermediate risk'.  Positive CHADS2 values include History of Diabetes.  Negative CHADS2 values include Age > 78 years old.  The start date was 08/31/2005.  Her last INR was 2.4.  Anticoagulation responsible provider: Ladona Ridgel MD, Sharlot Gowda.  INR POC: 2.3.  Cuvette Lot#: [id number].  Exp: 11/2011.    Anticoagulation Management Assessment/Plan:      The patient's current anticoagulation dose is Coumadin 5 mg  tabs: Take as directed by coumadin clinic..  The target INR is 2 - 3.  The next INR is due 01/11/2011.  Anticoagulation instructions were given to patient.  Results were reviewed/authorized by Bethena Midget, RN, BSN.  She was notified by Bethena Midget, RN, BSN.         Prior Anticoagulation Instructions: INR 2.4  Continue same dose of 1 tablet every day except 1/2 tablet on Friday.  Recheck INR in 4 weeks.   Current Anticoagulation Instructions: INR 2.3 Continue 5mg s everyday except 2.5mg s on Fridays. Recheck in 4 weeks.

## 2010-12-17 LAB — GLUCOSE, CAPILLARY
Glucose-Capillary: 101 mg/dL — ABNORMAL HIGH (ref 70–99)
Glucose-Capillary: 108 mg/dL — ABNORMAL HIGH (ref 70–99)
Glucose-Capillary: 108 mg/dL — ABNORMAL HIGH (ref 70–99)
Glucose-Capillary: 113 mg/dL — ABNORMAL HIGH (ref 70–99)
Glucose-Capillary: 114 mg/dL — ABNORMAL HIGH (ref 70–99)
Glucose-Capillary: 122 mg/dL — ABNORMAL HIGH (ref 70–99)
Glucose-Capillary: 125 mg/dL — ABNORMAL HIGH (ref 70–99)
Glucose-Capillary: 129 mg/dL — ABNORMAL HIGH (ref 70–99)
Glucose-Capillary: 136 mg/dL — ABNORMAL HIGH (ref 70–99)
Glucose-Capillary: 99 mg/dL (ref 70–99)

## 2010-12-17 LAB — BLOOD GAS, ARTERIAL
Bicarbonate: 31.4 mEq/L — ABNORMAL HIGH (ref 20.0–24.0)
FIO2: 0.32 %
O2 Saturation: 89.8 %
Patient temperature: 98.6

## 2010-12-17 LAB — CBC
HCT: 34.4 % — ABNORMAL LOW (ref 36.0–46.0)
Hemoglobin: 10.5 g/dL — ABNORMAL LOW (ref 12.0–15.0)
MCHC: 32.2 g/dL (ref 30.0–36.0)
MCV: 99.7 fL (ref 78.0–100.0)
MCV: 99.9 fL (ref 78.0–100.0)
Platelets: 187 10*3/uL (ref 150–400)
Platelets: 244 10*3/uL (ref 150–400)
RBC: 3.4 MIL/uL — ABNORMAL LOW (ref 3.87–5.11)
RDW: 13.8 % (ref 11.5–15.5)
RDW: 13.9 % (ref 11.5–15.5)
WBC: 6.5 10*3/uL (ref 4.0–10.5)

## 2010-12-17 LAB — BASIC METABOLIC PANEL
BUN: 13 mg/dL (ref 6–23)
BUN: 13 mg/dL (ref 6–23)
BUN: 22 mg/dL (ref 6–23)
CO2: 36 mEq/L — ABNORMAL HIGH (ref 19–32)
CO2: 39 mEq/L — ABNORMAL HIGH (ref 19–32)
Calcium: 12.1 mg/dL — ABNORMAL HIGH (ref 8.4–10.5)
Calcium: 12.2 mg/dL — ABNORMAL HIGH (ref 8.4–10.5)
Chloride: 93 mEq/L — ABNORMAL LOW (ref 96–112)
Chloride: 95 mEq/L — ABNORMAL LOW (ref 96–112)
Chloride: 96 mEq/L (ref 96–112)
Chloride: 98 mEq/L (ref 96–112)
Creatinine, Ser: 0.6 mg/dL (ref 0.4–1.2)
Creatinine, Ser: 0.61 mg/dL (ref 0.4–1.2)
Creatinine, Ser: 0.63 mg/dL (ref 0.4–1.2)
Creatinine, Ser: 0.78 mg/dL (ref 0.4–1.2)
GFR calc Af Amer: >60 mL/min (ref >60)
GFR calc Af Amer: >60 mL/min (ref >60)
GFR calc Af Amer: >60 mL/min (ref >60)
GFR calc non Af Amer: >60 mL/min (ref >60)
GFR calc non Af Amer: >60 mL/min (ref >60)
Glucose, Bld: 124 mg/dL — ABNORMAL HIGH (ref 70–99)
Glucose, Bld: 127 mg/dL — ABNORMAL HIGH (ref 70–99)
Potassium: 3.9 mEq/L (ref 3.5–5.1)
Potassium: 4.2 mEq/L (ref 3.5–5.1)
Potassium: 4.3 mEq/L (ref 3.5–5.1)
Sodium: 137 mEq/L (ref 135–145)
Sodium: 138 mEq/L (ref 135–145)

## 2010-12-17 LAB — BRAIN NATRIURETIC PEPTIDE: Pro B Natriuretic peptide (BNP): 42.4 pg/mL (ref 0.0–100.0)

## 2010-12-17 LAB — PROTIME-INR
INR: 2.97 — ABNORMAL HIGH (ref 0.00–1.49)
INR: 2.97 — ABNORMAL HIGH (ref 0.00–1.49)
Prothrombin Time: 25.6 seconds — ABNORMAL HIGH (ref 11.6–15.2)
Prothrombin Time: 27 seconds — ABNORMAL HIGH (ref 11.6–15.2)
Prothrombin Time: 30.7 seconds — ABNORMAL HIGH (ref 11.6–15.2)

## 2011-01-01 LAB — CBC
Hemoglobin: 12.6 g/dL (ref 12.0–15.0)
MCHC: 32.8 g/dL (ref 30.0–36.0)
Platelets: 170 10*3/uL (ref 150–400)
RDW: 13.6 % (ref 11.5–15.5)

## 2011-01-01 LAB — COMPREHENSIVE METABOLIC PANEL
Albumin: 3 g/dL — ABNORMAL LOW (ref 3.5–5.2)
Alkaline Phosphatase: 109 U/L (ref 39–117)
BUN: 30 mg/dL — ABNORMAL HIGH (ref 6–23)
Calcium: 11.7 mg/dL — ABNORMAL HIGH (ref 8.4–10.5)
Creatinine, Ser: 1.6 mg/dL — ABNORMAL HIGH (ref 0.4–1.2)
Glucose, Bld: 162 mg/dL — ABNORMAL HIGH (ref 70–99)
Potassium: 5.3 mEq/L — ABNORMAL HIGH (ref 3.5–5.1)
Total Protein: 7 g/dL (ref 6.0–8.3)

## 2011-01-01 LAB — URINE CULTURE
Colony Count: NO GROWTH
Culture: NO GROWTH

## 2011-01-01 LAB — DIFFERENTIAL
Basophils Absolute: 0 10*3/uL (ref 0.0–0.1)
Eosinophils Relative: 0 % (ref 0–5)
Lymphocytes Relative: 4 % — ABNORMAL LOW (ref 12–46)
Lymphs Abs: 1 10*3/uL (ref 0.7–4.0)
Monocytes Relative: 4 % (ref 3–12)
Neutrophils Relative %: 92 % — ABNORMAL HIGH (ref 43–77)

## 2011-01-01 LAB — LACTIC ACID, PLASMA: Lactic Acid, Venous: 1.9 mmol/L (ref 0.5–2.2)

## 2011-01-01 LAB — BLOOD GAS, ARTERIAL
Acid-Base Excess: 0.1 mmol/L (ref 0.0–2.0)
Bicarbonate: 30.3 mEq/L — ABNORMAL HIGH (ref 20.0–24.0)
Drawn by: 22251
O2 Content: 4 L/min
pCO2 arterial: 82.4 mmHg (ref 35.0–45.0)
pO2, Arterial: 52.4 mmHg — ABNORMAL LOW (ref 80.0–100.0)

## 2011-01-01 LAB — PROTIME-INR: INR: 2.11 — ABNORMAL HIGH (ref 0.00–1.49)

## 2011-01-01 LAB — URINALYSIS, ROUTINE W REFLEX MICROSCOPIC
Nitrite: NEGATIVE
Protein, ur: 100 mg/dL — AB
Specific Gravity, Urine: 1.027 (ref 1.005–1.030)
Urobilinogen, UA: 2 mg/dL — ABNORMAL HIGH (ref 0.0–1.0)

## 2011-01-01 LAB — CK TOTAL AND CKMB (NOT AT ARMC)
CK, MB: 8.1 ng/mL — ABNORMAL HIGH (ref 0.3–4.0)
Relative Index: 1.9 (ref 0.0–2.5)
Total CK: 423 U/L — ABNORMAL HIGH (ref 7–177)

## 2011-01-01 LAB — CULTURE, BLOOD (ROUTINE X 2)
Culture: NO GROWTH
Culture: NO GROWTH

## 2011-01-01 LAB — T4, FREE: Free T4: 0.97 ng/dL (ref 0.80–1.80)

## 2011-01-01 LAB — URINE MICROSCOPIC-ADD ON

## 2011-01-01 LAB — TROPONIN I: Troponin I: 0.46 ng/mL — ABNORMAL HIGH (ref 0.00–0.06)

## 2011-01-11 ENCOUNTER — Encounter: Payer: BC Managed Care – PPO | Admitting: *Deleted

## 2011-01-16 LAB — CBC
HCT: 33.8 % — ABNORMAL LOW (ref 36.0–46.0)
HCT: 37.5 % (ref 36.0–46.0)
Hemoglobin: 11.4 g/dL — ABNORMAL LOW (ref 12.0–15.0)
Hemoglobin: 11.9 g/dL — ABNORMAL LOW (ref 12.0–15.0)
Hemoglobin: 12.7 g/dL (ref 12.0–15.0)
MCHC: 33.9 g/dL (ref 30.0–36.0)
MCHC: 34 g/dL (ref 30.0–36.0)
MCV: 95.9 fL (ref 78.0–100.0)
MCV: 96.2 fL (ref 78.0–100.0)
MCV: 96.8 fL (ref 78.0–100.0)
Platelets: 189 10*3/uL (ref 150–400)
Platelets: 209 10*3/uL (ref 150–400)
Platelets: 270 10*3/uL (ref 150–400)
RBC: 3.51 MIL/uL — ABNORMAL LOW (ref 3.87–5.11)
RBC: 3.79 MIL/uL — ABNORMAL LOW (ref 3.87–5.11)
RBC: 3.91 MIL/uL (ref 3.87–5.11)
RDW: 14.4 % (ref 11.5–15.5)
RDW: 14.5 % (ref 11.5–15.5)
RDW: 14.9 % (ref 11.5–15.5)
WBC: 8 10*3/uL (ref 4.0–10.5)
WBC: 8 10*3/uL (ref 4.0–10.5)
WBC: 8.1 10*3/uL (ref 4.0–10.5)
WBC: 8.7 10*3/uL (ref 4.0–10.5)

## 2011-01-16 LAB — DIFFERENTIAL
Basophils Absolute: 0 10*3/uL (ref 0.0–0.1)
Basophils Relative: 0 % (ref 0–1)
Eosinophils Absolute: 0 10*3/uL (ref 0.0–0.7)
Eosinophils Relative: 0 % (ref 0–5)
Lymphocytes Relative: 5 % — ABNORMAL LOW (ref 12–46)
Lymphs Abs: 0.4 10*3/uL — ABNORMAL LOW (ref 0.7–4.0)
Monocytes Absolute: 0.9 10*3/uL (ref 0.1–1.0)
Monocytes Relative: 10 % (ref 3–12)
Neutro Abs: 7.3 10*3/uL (ref 1.7–7.7)
Neutrophils Relative %: 85 % — ABNORMAL HIGH (ref 43–77)

## 2011-01-16 LAB — COMPREHENSIVE METABOLIC PANEL WITH GFR
ALT: 65 U/L — ABNORMAL HIGH (ref 0–35)
AST: 62 U/L — ABNORMAL HIGH (ref 0–37)
Albumin: 2.3 g/dL — ABNORMAL LOW (ref 3.5–5.2)
Alkaline Phosphatase: 105 U/L (ref 39–117)
BUN: 14 mg/dL (ref 6–23)
CO2: 28 meq/L (ref 19–32)
Calcium: 11.2 mg/dL — ABNORMAL HIGH (ref 8.4–10.5)
Chloride: 103 meq/L (ref 96–112)
Creatinine, Ser: 0.8 mg/dL (ref 0.4–1.2)
GFR calc non Af Amer: >60 mL/min
Glucose, Bld: 102 mg/dL — ABNORMAL HIGH (ref 70–99)
Potassium: 3.7 meq/L (ref 3.5–5.1)
Sodium: 139 meq/L (ref 135–145)
Total Bilirubin: 0.8 mg/dL (ref 0.3–1.2)
Total Protein: 6.5 g/dL (ref 6.0–8.3)

## 2011-01-16 LAB — URINE CULTURE: Colony Count: >=100000

## 2011-01-16 LAB — GLUCOSE, CAPILLARY
Glucose-Capillary: 100 mg/dL — ABNORMAL HIGH (ref 70–99)
Glucose-Capillary: 106 mg/dL — ABNORMAL HIGH (ref 70–99)
Glucose-Capillary: 106 mg/dL — ABNORMAL HIGH (ref 70–99)
Glucose-Capillary: 107 mg/dL — ABNORMAL HIGH (ref 70–99)
Glucose-Capillary: 109 mg/dL — ABNORMAL HIGH (ref 70–99)
Glucose-Capillary: 111 mg/dL — ABNORMAL HIGH (ref 70–99)
Glucose-Capillary: 118 mg/dL — ABNORMAL HIGH (ref 70–99)
Glucose-Capillary: 99 mg/dL (ref 70–99)

## 2011-01-16 LAB — CK TOTAL AND CKMB (NOT AT ARMC)
CK, MB: 10.4 ng/mL — ABNORMAL HIGH (ref 0.3–4.0)
Relative Index: 1.8 (ref 0.0–2.5)
Total CK: 584 U/L — ABNORMAL HIGH (ref 7–177)

## 2011-01-16 LAB — BASIC METABOLIC PANEL
BUN: 14 mg/dL (ref 6–23)
CO2: 29 mEq/L (ref 19–32)
CO2: 30 mEq/L (ref 19–32)
Chloride: 101 mEq/L (ref 96–112)
Chloride: 102 mEq/L (ref 96–112)
Chloride: 99 mEq/L (ref 96–112)
Creatinine, Ser: 0.57 mg/dL (ref 0.4–1.2)
Creatinine, Ser: 0.8 mg/dL (ref 0.4–1.2)
GFR calc Af Amer: >60 mL/min (ref >60)
GFR calc Af Amer: >60 mL/min (ref >60)
Glucose, Bld: 127 mg/dL — ABNORMAL HIGH (ref 70–99)
Potassium: 4 mEq/L (ref 3.5–5.1)
Sodium: 134 mEq/L — ABNORMAL LOW (ref 135–145)

## 2011-01-16 LAB — POCT I-STAT 3, ART BLOOD GAS (G3+)
TCO2: 30 mmol/L (ref 0–100)
pH, Arterial: 7.413 — ABNORMAL HIGH (ref 7.350–7.400)

## 2011-01-16 LAB — URINALYSIS, ROUTINE W REFLEX MICROSCOPIC
Bilirubin Urine: NEGATIVE
Glucose, UA: NEGATIVE mg/dL
Glucose, UA: NEGATIVE mg/dL
Ketones, ur: NEGATIVE mg/dL
Leukocytes, UA: NEGATIVE
Protein, ur: 30 mg/dL — AB
Protein, ur: NEGATIVE mg/dL
Specific Gravity, Urine: 1.021 (ref 1.005–1.030)
pH: 5.5 (ref 5.0–8.0)

## 2011-01-16 LAB — HEPATIC FUNCTION PANEL
ALT: 85 U/L — ABNORMAL HIGH (ref 0–35)
AST: 150 U/L — ABNORMAL HIGH (ref 0–37)
Albumin: 2.2 g/dL — ABNORMAL LOW (ref 3.5–5.2)
Alkaline Phosphatase: 97 U/L (ref 39–117)
Bilirubin, Direct: 0.2 mg/dL (ref 0.0–0.3)
Indirect Bilirubin: 0.5 mg/dL (ref 0.3–0.9)
Total Bilirubin: 0.7 mg/dL (ref 0.3–1.2)
Total Protein: 6.1 g/dL (ref 6.0–8.3)

## 2011-01-16 LAB — LIPID PANEL
HDL: <10 mg/dL — ABNORMAL LOW
Triglycerides: 148 mg/dL
VLDL: 30 mg/dL (ref 0–40)

## 2011-01-16 LAB — CARDIAC PANEL(CRET KIN+CKTOT+MB+TROPI)
CK, MB: 49 ng/mL — ABNORMAL HIGH (ref 0.3–4.0)
Relative Index: 1 (ref 0.0–2.5)
Total CK: 1048 U/L — ABNORMAL HIGH (ref 7–177)
Total CK: 4719 U/L — ABNORMAL HIGH (ref 7–177)
Total CK: 6306 U/L — ABNORMAL HIGH (ref 7–177)

## 2011-01-16 LAB — URINE MICROSCOPIC-ADD ON

## 2011-01-16 LAB — PROTIME-INR
INR: 2.6 — ABNORMAL HIGH (ref 0.00–1.49)
Prothrombin Time: 29.4 s — ABNORMAL HIGH (ref 11.6–15.2)
Prothrombin Time: 32.1 seconds — ABNORMAL HIGH (ref 11.6–15.2)

## 2011-01-16 LAB — POCT CARDIAC MARKERS
CKMB, poc: 16.5 ng/mL (ref 1.0–8.0)
Myoglobin, poc: >500 ng/mL (ref 12–200)
Troponin i, poc: <0.05 ng/mL (ref 0.00–0.09)

## 2011-01-16 LAB — COMPREHENSIVE METABOLIC PANEL
ALT: 107 U/L — ABNORMAL HIGH (ref 0–35)
Albumin: 2.2 g/dL — ABNORMAL LOW (ref 3.5–5.2)
Alkaline Phosphatase: 94 U/L (ref 39–117)
Chloride: 101 mEq/L (ref 96–112)
Glucose, Bld: 102 mg/dL — ABNORMAL HIGH (ref 70–99)
Potassium: 4.2 mEq/L (ref 3.5–5.1)
Sodium: 138 mEq/L (ref 135–145)
Total Protein: 6.4 g/dL (ref 6.0–8.3)

## 2011-01-16 LAB — T3 UPTAKE: T3 Uptake Ratio: 48 % — ABNORMAL HIGH (ref 22.5–37.0)

## 2011-01-16 LAB — BRAIN NATRIURETIC PEPTIDE: Pro B Natriuretic peptide (BNP): 863 pg/mL — ABNORMAL HIGH (ref 0.0–100.0)

## 2011-01-16 LAB — HEMOGLOBIN A1C
Hgb A1c MFr Bld: 6.4 % — ABNORMAL HIGH (ref 4.6–6.1)
Mean Plasma Glucose: 137 mg/dL

## 2011-01-16 LAB — TROPONIN I

## 2011-01-16 LAB — TSH: TSH: 6.209 u[IU]/mL — ABNORMAL HIGH (ref 0.350–4.500)

## 2011-01-18 ENCOUNTER — Encounter: Payer: BC Managed Care – PPO | Admitting: *Deleted

## 2011-01-25 ENCOUNTER — Ambulatory Visit (INDEPENDENT_AMBULATORY_CARE_PROVIDER_SITE_OTHER): Payer: BC Managed Care – PPO | Admitting: *Deleted

## 2011-01-25 DIAGNOSIS — I4891 Unspecified atrial fibrillation: Secondary | ICD-10-CM

## 2011-01-25 DIAGNOSIS — I2699 Other pulmonary embolism without acute cor pulmonale: Secondary | ICD-10-CM

## 2011-01-25 DIAGNOSIS — Z8679 Personal history of other diseases of the circulatory system: Secondary | ICD-10-CM

## 2011-01-25 DIAGNOSIS — Z86718 Personal history of other venous thrombosis and embolism: Secondary | ICD-10-CM

## 2011-01-25 LAB — POCT INR: INR: 2.1

## 2011-01-27 ENCOUNTER — Encounter: Payer: Self-pay | Admitting: Family Medicine

## 2011-02-05 ENCOUNTER — Other Ambulatory Visit: Payer: Self-pay | Admitting: Pharmacist

## 2011-02-05 MED ORDER — WARFARIN SODIUM 5 MG PO TABS: ORAL_TABLET | ORAL | Status: DC

## 2011-02-06 ENCOUNTER — Other Ambulatory Visit: Payer: Self-pay | Admitting: Family Medicine

## 2011-02-06 DIAGNOSIS — E559 Vitamin D deficiency, unspecified: Secondary | ICD-10-CM

## 2011-02-06 DIAGNOSIS — E7800 Pure hypercholesterolemia, unspecified: Secondary | ICD-10-CM

## 2011-02-06 NOTE — Telephone Encounter (Signed)
Please confirm with pt what she is treating with these and if condition is worse

## 2011-02-06 NOTE — Telephone Encounter (Signed)
Patient notified as instructed by telephone. Pt said condition is not worse but keeps reoccuring. The Bactroban she uses on places that come on her leg (not always the same spot) and the Loprox she needs every month for area under her breast. Pt request large size tube of the Loprox.Please advise. Pt said did not need call back she would ck with pharmacy tomorrow and has appt to see Dr [name] next week.

## 2011-02-06 NOTE — Telephone Encounter (Signed)
Px written for call in   

## 2011-02-07 NOTE — Telephone Encounter (Signed)
Medication [facility] as instructed. Pt will ck with pharmacy today about refills.

## 2011-02-08 ENCOUNTER — Other Ambulatory Visit (INDEPENDENT_AMBULATORY_CARE_PROVIDER_SITE_OTHER): Payer: BC Managed Care – PPO

## 2011-02-08 DIAGNOSIS — E119 Type 2 diabetes mellitus without complications: Secondary | ICD-10-CM

## 2011-02-08 DIAGNOSIS — E7800 Pure hypercholesterolemia, unspecified: Secondary | ICD-10-CM

## 2011-02-08 DIAGNOSIS — E559 Vitamin D deficiency, unspecified: Secondary | ICD-10-CM

## 2011-02-08 LAB — RENAL FUNCTION PANEL
Albumin: 3.6 g/dL (ref 3.5–5.2)
CO2: 34 mEq/L — ABNORMAL HIGH (ref 19–32)
Calcium: 11.6 mg/dL — ABNORMAL HIGH (ref 8.4–10.5)
Creatinine, Ser: 0.7 mg/dL (ref 0.4–1.2)
Sodium: 144 mEq/L (ref 135–145)

## 2011-02-08 LAB — LIPID PANEL
Cholesterol: 162 mg/dL (ref 0–200)
LDL Cholesterol: 88 mg/dL (ref 0–99)
Triglycerides: 192 mg/dL — ABNORMAL HIGH (ref 0.0–149.0)
VLDL: 38.4 mg/dL (ref 0.0–40.0)

## 2011-02-08 LAB — ALT: ALT: 31 U/L (ref 0–35)

## 2011-02-13 NOTE — Discharge Summary (Signed)
Amy Bryant, Amy Bryant NO.:  [id number]   MEDICAL RECORD NO.:  [id number]          PATIENT TYPE:  INP   LOCATION:  2029                         FACILITY:  [facility]   PHYSICIAN:  Amy Riffle, MD, FACCDATE OF BIRTH:  1955-10-28   DATE OF ADMISSION:  11/22/2008  DATE OF DISCHARGE:  11/26/2008                               DISCHARGE SUMMARY   PRIMARY CARDIOLOGIST:  Amy Riffle, MD, [facility]   PRIMARY CARE Amy Bryant:  Amy Bryant. [name], MD   PRIMARY PULMONOLOGIST:  Amy Share, MD,[facility]   DISCHARGE DIAGNOSIS:  Atrial fibrillation with rapid ventricular  response.   SECONDARY DIAGNOSES:  1. Hypothyroidism.  2. Urinary tract infection.  3. Morbid obesity.  4. Type 2 diabetes mellitus.  5. Hyperlipidemia.  6. Obstructive sleep apnea.  7. History of deep venous thrombosis and pulmonary embolism, on      chronic Coumadin.  8. Gastroesophageal reflux disease.  9. Obesity hypoventilation syndrome, on chronic home oxygen.   ALLERGIES:  No known drug allergies.   PROCEDURES:  A 2-D echocardiogram on November 23, 2008, revealing an EF  of 55%.  Successful synchronized cardioversion on November 25, 2008,  using 1 biphasic 200-joule shock.   HISTORY OF PRESENT ILLNESS:  A 55 year old obese female with the above  problem list.  She presented to the [facility] ED on November 22, 2008,  following a 1-week history of progressive dyspnea and cough as well as  fevers up to 101.5.  In the ED, she was found to be in atrial  fibrillation with rapid ventricle response at a rate of 176 beats per  minute.  She had elevation of her CK and MB up to 16.5, but a normal  troponin.  Her INR was therapeutic having been on chronic Coumadin  therapy for PE and DVT.  The patient was admitted for further  evaluation.   HOSPITAL COURSE:  The patient was placed on IV diltiazem and maintained  on her Coumadin therapy.  With diltiazem, her rate slowed, but did not  convert.  Her troponins  remained negative, although she did have  elevation of her CK to 6306 with an MB of 49.0.  Her BNP was also  elevated at 863 and chest x-ray showed cardiomegaly and interstitial  edema.  The patient was placed on intravenous Lasix for diastolic  dysfunction.  A 2-D echocardiogram showed normal LV function.  Decision  was made to pursue cardioversion and this took place on November 25, 2008, and was successful.  Following cardioversion, the patient was  placed on oral diltiazem and digoxin was discontinued.  She has been  maintaining sinus rhythm since cardioversion and her INR has remained  therapeutic.   Amy Bryant was noted to have urinary tract infection.  She has been placed  on Ceftin 500 mg b.i.d. at the time of discharge.  We will plan to  follow her INR closely in the setting of concurrent antibiotics use.   Amy Bryant TSH was found to be elevated at 6.209.  Despite this, her  free T4 was normal.  Her T3  uptake was elevated at 48.0.  Amy Bryant has  informed us that she has followup with Endocrinology scheduled, and this  being the case, we will defer to their judgment with regards to her  appropriate Synthroid dose.  We have kept her on 100 mcg daily.   DISCHARGE LABORATORY DATA:  Hemoglobin 12.3, hematocrit 36.6, WBC 8.0,  platelets 330, and MCV 96.8.  Sodium 135, potassium 4.5, chloride 101,  CO2 30, BUN 13, creatinine 0.57, glucose 108, INR 2.9, total bilirubin  0.7, alkaline phosphatase 94, AST 165, ALT 107, albumin 2.2, CK 1048, MB  6.9, troponin I 0.01, total cholesterol 129, triglycerides 148, HDL less  than 10, LDL not calculated, and calcium 11.7.  Urinalysis, positive  nitrite, large LE, many bacteria, 11-20 WBC.  BNP following diuresis was  159, T3 uptake 48.0, free T4 0.95, TSH 6.209, and hemoglobin A1c 6.4.   DISPOSITION:  The patient is being discharged home today in good  condition.   FOLLOWUP PLANS AND APPOINTMENTS:  We have arranged for Coumadin Clinic   followup in our office on Monday, November 29, 2008.  She is to follow up  with Dr. Dietrich Pates on December 13, 2008, at 3 p.m.   DISCHARGE MEDICATIONS:  1. Diltiazem CD 120 mg daily.  2. Coumadin 5 mg daily except Mondays and Fridays when she takes 2.5      mg.  3. Ceftin 500 mg b.i.d.  4. Levothyroxine 100 mcg daily.  5. Lasix 80 mg b.i.d.  6. K-Dur 40 mEq daily.  7. Advair 250/50 one puff b.i.d.  8. Nasonex 1 spray each nostril at bedtime.  9. O2 3 L per minute.   OUTSTANDING LABORATORY STUDIES:  None.   DURATION OF DISCHARGE ENCOUNTER:  60 minutes including physician time.      Amy Bryant, ANP      Amy Riffle, MD, [facility]  Electronically Signed    CB/MEDQ  D:  11/26/2008  T:  11/26/2008  Job:  [id number]   cc:   Amy A. [name], MD

## 2011-02-13 NOTE — H&P (Signed)
Amy Bryant, MODER NO.:  [id number]   MEDICAL RECORD NO.:  [id number]          PATIENT TYPE:  INP   LOCATION:  2314                         FACILITY:  [facility]   PHYSICIAN:  Pricilla Riffle, MD, FACCDATE OF BIRTH:  08-Oct-1955   DATE OF ADMISSION:  11/22/2008  DATE OF DISCHARGE:                              HISTORY & PHYSICAL   TIME OF ADMISSION:  2 p.m.   PRIMARY CARE PHYSICIAN:  Marne A. [name], MD, of [place], [facility]  Primary Care.   PULMONOLOGIST:  Barbaraann Share, MD, [facility], [place] [facility].   There is no primary cardiologist.   CHIEF COMPLAINT:  Shortness of breath.   HISTORY OF PRESENT ILLNESS:  Amy Bryant is a 55 year old female with past  medical history of PE and DVT, obesity hypoventilation syndrome, and  hypothyroidism, presenting with shortness of breath.  She reports  generalized malaise for 1 week with progressive shortness of breath and  cough.  She has had temperatures of up to 101.5, last elevated  temperature was 3 days ago.  She reports chills, weakness, nausea, and  diarrhea.  Her cough is nonproductive, shortness of breath is constant  and persistent at rest.  She has also had left-sided chest pain of 8-10  out of 10 in intensity, which occurs only at night.  It is a sharp pain  going from her back to under her left breast.   PAST MEDICAL HISTORY:  1. Obesity hypoventilation syndrome.  2. Pulmonary embolism with DVT October 2006, on coumadin since that      time.  3. GERD.  4. Hypothyroid.   ALLERGIES:  None.   MEDICATIONS:  1. Lasix 80 mg b.i.d.  2. Potassium 40 mEq daily  3. Warfarin.  4. Synthroid 100 mcg.  5. Advair 250/50 once a day.  6. Prilosec.  7. Nasonex.  8. Nasal cannula oxygen at 3 L at all times.   SOCIAL HISTORY:  Amy Bryant lives in [place] with her husband.  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  She has a 30 plus pack  year smoking history.  She quit 6 years ago.  She denies alcohol  use,  drug use nonprescription medications.   FAMILY HISTORY:  Positive for coronary artery disease and MI in her  father at age 23, diabetes in her father.  She has 1 brother who died at  age 55 of an MI.  She has 1 son, who is a diabetic.   REVIEW OF SYSTEMS:  CONSTITUTIONAL:  Positive for fevers, chills, and  sweats.  CARDIOPULMONARY:  Positive for chest pain, shortness of breath,  orthopnea, paroxysmal nocturnal dyspnea, palpitations, presyncope, and  wheezing.  NEUROPSYCH:  Positive for weakness.  GI:  Positive for nausea  and diarrhea.  All other systems negative.   PHYSICAL EXAMINATION:  VITAL SIGNS:  Afebrile, pulse 163-106,  respirations 23, blood pressure 101/73, and oxygen saturation 92% on 3  L.  GENERAL:  The patient is dyspneic, obese, and uncomfortable appearing.  HEENT:  Head is normocephalic and atraumatic.  Pupils equal, round, and  reactive.  Extraocular motion intact.  Sclerae are clear.  Dentition is  good.  Mucous membranes are dry.  There is a light film over the tongue.  Posterior oropharynx is clear.  NECK:  Supple with no lymphadenopathy, bruit, or JVD.  CARDIOVASCULAR:  Heart rate is tachycardic, irregular.  No murmurs,  rubs, or gallops.  LUNGS:  Generalized wheezing with pronounced left-sided crackles.  SKIN:  There is no rash or lesion.  ABDOMEN:  Soft, nontender with normal bowel sounds.  EXTREMITIES:  +1 pulses with trace edema.  NEUROLOGIC:  The patient is alert and oriented x4.  Cranial nerves are  intact.  Strength is 5/5 in all extremities.   RADIOLOGY:  Chest x-ray; positive for interstitial edema.   EKG; rate of 176, atrial fibrillation.   LABORATORIES:  White blood cells 8.7, hemoglobin 12.6, hematocrit 37.5,  and platelets 189.  Sodium 137, potassium 4.0, chloride 102, bicarb 26,  BUN 14, creatinine 0.8, and glucose 127.  BNP is 863.  Point-of-care  markers positive for elevated CK-MB at 16.5, elevated myoglobin at 500,  and negative  troponin.  INR is 2.6.   ASSESSMENT AND PLAN:  1. Atrial fibrillation with rapid ventricular response, new onset.      The patient does not recall prior history of atrial fibrillation.      Possible etiologies include: a) ischemia: at this time, there is no      heat EKG change to suggest, and point-of-care troponin is negative.      We will continue to cycle  cardiac enzymes. b) Pulmonary embolism.      This is less likely with an INR of 2.6 however, she may have had      fluctuations in her INR over the past week.  At this time, we will      continue Coumadin. c) Pneumonia, this is unlikely with no increase      in white blood cells, no fever at this time.  Chest x-ray does not      support, so at this time, we will hold antibiotics. d) Hypothyroid,      We will check TSH.  Suspect multifocal etiology of atrial      fibrillation with resulting left heart failure/pulmonary edema.  We      will check a 2-D echo in the morning to assess ejection fraction      and mechanical structures.  We will continue Cardizem drip for rate      control and add digoxin.  2. Congestive heart failure.  BNP is elevated.  Chest x-ray showing      edema, she probably has diastolic dysfunction.  We will check 2-D      echo.  At this time, we will diurese with 40 mg of IV Lasix now.      We will check daily weights.  Strict I's and O's.  Diurese as      necessary to improve respiratory function.  3. Diabetes.  We will check A1c and initiate sliding scale insulin.  4. History of pulmonary embolism.  We will continue Coumadin.  There      is question of history of cor pulmonale, however, current symptoms      are more suggestive of left-sided failure.  Body habitus very      suggestive of obesity hypoventilation, sleep study may be helpful      to assess whether if she needs CPAP.      Elby Showers, MD  Electronically Signed  Pricilla Riffle, MD, [facility]  Electronically Signed    CW/MEDQ  D:   11/22/2008  T:  11/23/2008  Job:  [id number]

## 2011-02-13 NOTE — Assessment & Plan Note (Signed)
[facility]   NAME:  Amy Bryant, Amy Bryant                ACCOUNT NO.:  [id number]   MEDICAL RECORD NO.:  [id number]      DATE OF BIRTH:  September 14, 1956   PHYSICIAN:  Madolyn Frieze. Jens Som, MD, [facility] VISIT DATE:  11/25/2008                                   OFFICE VISIT   This is cardioversion of atrial fibrillation.  The patient was sedated  by Anesthesia with etomidate 18 mg intravenously.  Synchronized  cardioversion (200 joules, biphasic) resulted in normal sinus rhythm.  There were no immediate complications.  We would recommend continuing  Coumadin.      Madolyn Frieze Jens Som, MD, [facility]  Electronically Signed     BSC/MEDQ  D:  11/25/2008  T:  11/26/2008  Job:  [id number]

## 2011-02-14 ENCOUNTER — Ambulatory Visit (INDEPENDENT_AMBULATORY_CARE_PROVIDER_SITE_OTHER): Payer: BC Managed Care – PPO | Admitting: Family Medicine

## 2011-02-14 ENCOUNTER — Encounter: Payer: Self-pay | Admitting: Family Medicine

## 2011-02-14 DIAGNOSIS — E039 Hypothyroidism, unspecified: Secondary | ICD-10-CM

## 2011-02-14 DIAGNOSIS — E7800 Pure hypercholesterolemia, unspecified: Secondary | ICD-10-CM

## 2011-02-14 DIAGNOSIS — J019 Acute sinusitis, unspecified: Secondary | ICD-10-CM | POA: Insufficient documentation

## 2011-02-14 DIAGNOSIS — E213 Hyperparathyroidism, unspecified: Secondary | ICD-10-CM

## 2011-02-14 DIAGNOSIS — E669 Obesity, unspecified: Secondary | ICD-10-CM

## 2011-02-14 DIAGNOSIS — E119 Type 2 diabetes mellitus without complications: Secondary | ICD-10-CM

## 2011-02-14 DIAGNOSIS — E559 Vitamin D deficiency, unspecified: Secondary | ICD-10-CM

## 2011-02-14 MED ORDER — AMOXICILLIN-POT CLAVULANATE 875-125 MG PO TABS: 1.0000 | ORAL_TABLET | Freq: Two times a day (BID) | ORAL | Status: AC

## 2011-02-14 MED ORDER — GLIPIZIDE ER 5 MG PO TB24: 5.0000 mg | ORAL_TABLET | Freq: Every day | ORAL | Status: DC

## 2011-02-14 NOTE — Assessment & Plan Note (Signed)
Better  After high dose tx inst to take 2000 iu D3 daily now  Will continue to monitor

## 2011-02-14 NOTE — Assessment & Plan Note (Signed)
Good control with simvastatin Rev labs No myalgias Rev low sat fat diet

## 2011-02-14 NOTE — Assessment & Plan Note (Signed)
Pt aware wt puts her at great health risks Difficulty motivating her to change diet

## 2011-02-14 NOTE — Assessment & Plan Note (Signed)
New from allergies tx with augmentin and update sympt care disc

## 2011-02-14 NOTE — Progress Notes (Signed)
Subjective:    Patient ID: Amy Bryant, female    DOB: May 25, 1956, 55 y.o.   MRN: [id number]  HPI Here for f/u of multiple med problems incl DM and hyperparathyroid and lipids and vit D def   Is sick -- not feeling good  Has a lot of congestion -- on and off since easter time  Blowing yellow mucous out of nose  couging a little bit - some wheeze Some allergies  Worse outdoors -- pollen Also works in office full of smoke Has had a headache like a sinus infection Some wheeze- not too bad - worse first thing in the am    DM a bit worse with Ai1 up to 7.3 Diet-- not good , and has a plan to work on that Sugar averages in mid 160s, once was 202  Is eating too many carbs and fruit   opthy-- has not been able to go   Hyperparathyroid ca is 11.6 up slt and phos 1.6 down slightly  Still cannot afford addn testing/ endo visit or surgical consult for this No tremor occ muscle spasm in neck Generalized fatigue  Knows she needs dexa but cannot afford it  Taking vit D  Lipids are well controlled with LDL 88 on simvastatin  Lab Results  Component Value Date   CHOL 162 02/08/2011   CHOL 152 11/02/2010   CHOL 149 11/22/2009   Lab Results  Component Value Date   HDL 35.20* 02/08/2011   HDL 32.70* 11/02/2010   HDL 03.47 11/22/2009   Lab Results  Component Value Date   LDLCALC 88 02/08/2011   LDLCALC 90 11/02/2010   LDLCALC 66 11/22/2009   Lab Results  Component Value Date   TRIG 192.0* 02/08/2011   TRIG 148.0 11/02/2010   TRIG 175.0* 11/22/2009   Lab Results  Component Value Date   CHOLHDL 5 02/08/2011   CHOLHDL 5 11/02/2010   CHOLHDL 3 11/22/2009   Lab Results  Component Value Date   LDLDIRECT 157.6 08/16/2009     Vit D level imp at 34 up from 23 Finished the high dose px  ? How much to take now   bp is in good control 114/68 No med side eff No new sob or cp   Past Medical History  Diagnosis Date  . GERD (gastroesophageal reflux disease)   . Pulmonary embolism     hx of  on coumadin  . Morbid obesity   . Reactive airways dysfunction syndrome   . Cor pulmonale     and obesity hypoventilation syndrome  . Hypothyroid   . OA (osteoarthritis)   . Atrial fibrillation     with cardioversion success    Past Surgical History  Procedure Date  . Tonsillectomy     Family History  Problem Relation Age of Onset  . Heart disease Father 85    MI  . Diabetes Brother     Allergies  Allergen Reactions  . Pseudoephedrine     REACTION: tightness in chest    History   Social History  . Marital Status: Married    Spouse Name: N/A    Number of Children: N/A  . Years of Education: N/A   Occupational History  . Not on file.   Social History Main Topics  . Smoking status: Former Games developer  . Smokeless tobacco: Not on file  . Alcohol Use:   . Drug Use:   . Sexually Active:    Other Topics Concern  . Not on file  Social History Narrative  . No narrative on file   Current outpatient prescriptions:ciclopirox (LOPROX) 0.77 % cream, APPLY TO AFFECTED AREAS 2 TIMES A DAY AS NEEDED, Disp: 90 g, Rfl: 3;  diclofenac (VOLTAREN) 75 MG EC tablet, Take 75 mg by mouth 2 (two) times daily. with food as needed for pain. , Disp: , Rfl: ;  diltiazem (TIAZAC) 120 MG 24 hr capsule, Take 120 mg by mouth daily.  , Disp: , Rfl:  Fluticasone-Salmeterol (ADVAIR DISKUS) 250-50 MCG/DOSE AEPB, Inhale 1 puff into the lungs every 12 (twelve) hours.  , Disp: , Rfl: ;  furosemide (LASIX) 80 MG tablet, Take 80 mg by mouth 2 (two) times daily.  , Disp: , Rfl: ;  glucose blood test strip, Test blood sugar once daily and as needed for DM 250.0 , Disp: , Rfl: ;  Lancets (ONETOUCH ULTRASOFT) lancets, Test blood sugar once daily and as needed for DM 250.0 , Disp: , Rfl:  levothyroxine (SYNTHROID, LEVOTHROID) 137 MCG tablet, Take 137 mcg by mouth daily.  , Disp: , Rfl: ;  loratadine (CLARITIN) 10 MG tablet, OTC as directed. , Disp: , Rfl: ;  metFORMIN (GLUCOPHAGE) 1000 MG tablet, Take 1,000 mg by mouth  2 (two) times daily with a meal.  , Disp: , Rfl: ;  mometasone (ELOCON) 0.1 % cream, Tiny amount to each ear canal twice weekly as needed. , Disp: , Rfl:  mometasone (NASONEX) 50 MCG/ACT nasal spray, 1 spray by Nasal route daily.  , Disp: , Rfl: ;  Multiple Vitamin (MULTIVITAMIN) capsule, Take 1 capsule by mouth daily.  , Disp: , Rfl: ;  mupirocin (BACTROBAN) 2 % ointment, APPLY TO AFFECTED AREA TWO TIMES A DAY AS NEEDED, Disp: 30 g, Rfl: 3;  omeprazole (PRILOSEC) 20 MG capsule, Take 20 mg by mouth 2 (two) times daily.  , Disp: , Rfl:  potassium chloride SA (K-DUR,KLOR-CON) 20 MEQ tablet, Take 40 mEq by mouth daily.  , Disp: , Rfl: ;  simvastatin (ZOCOR) 20 MG tablet, Take 20 mg by mouth at bedtime.  , Disp: , Rfl: ;  warfarin (COUMADIN) 5 MG tablet, Take as directed by Anticoagulation clinic , Disp: 30 tablet, Rfl: 1;  amoxicillin-clavulanate (AUGMENTIN) 875-125 MG per tablet, Take 1 tablet by mouth 2 (two) times daily., Disp: 20 tablet, Rfl: 0 DILTIAZEM HCL ER BEADS PO, Take by mouth.  , Disp: , Rfl: ;  ergocalciferol (DRISDOL) 50000 UNITS capsule, Take 50,000 Units by mouth once a week.  , Disp: , Rfl: ;  glipiZIDE (GLUCOTROL XL) 5 MG 24 hr tablet, Take 1 tablet (5 mg total) by mouth daily., Disp: 30 tablet, Rfl: 11;  mupirocin (BACTROBAN) 2 % ointment, Apply 1 application topically 2 (two) times daily.  , Disp: , Rfl:      Review of Systems Review of Systems  Constitutional: Negative for fever, appetite change,and unexpected weight change. pos for fatigue and generalized malaise  Eyes: Negative for pain and visual disturbance.  Respiratory: pos for cough/ mild wheeze/ baseline sob ENT pos for rhinorrhea/ sinus congestion/ st Cardiovascular: Negative for cp .   Gastrointestinal: Negative for nausea, diarrhea and constipation.  Genitourinary: Negative for urgency and frequency.  Skin: Negative for pallor. or rash  Neurological: Negative for weakness, light-headedness, numbness and headaches.    Hematological: Negative for adenopathy. Does not bruise/bleed easily.  Psychiatric/Behavioral: Negative for dysphoric mood. The patient is not nervous/anxious.          Objective:   Physical Exam  Constitutional: She  appears well-developed and well-nourished. No distress.       Morbidly obese and generally fatigued  No resp distress   HENT:  Head: Normocephalic and atraumatic.  Right Ear: External ear normal.  Left Ear: External ear normal.  Mouth/Throat: Oropharynx is clear and moist.       Some sinus tenderness worse L max and ethmoid Nares congested and injected bilat    Eyes: Conjunctivae and EOM are normal. Pupils are equal, round, and reactive to light. Right eye exhibits no discharge. Left eye exhibits no discharge.  Neck: Normal range of motion. Neck supple. No JVD present. Carotid bruit is not present. Erythema present. No thyromegaly present.  Cardiovascular: Normal rate, regular rhythm and normal heart sounds.   Pulmonary/Chest: Effort normal and breath sounds normal. No respiratory distress. She has no rales. She exhibits no tenderness.       Mild wheeze on forced exp only  Abdominal: Soft. Bowel sounds are normal. She exhibits no abdominal bruit. There is no tenderness.  Musculoskeletal: Normal range of motion. She exhibits edema. She exhibits no tenderness.       Baseline ankle edema   Lymphadenopathy:    She has no cervical adenopathy.  Neurological: She is alert. She has normal reflexes. No cranial nerve deficit. She exhibits normal muscle tone. Coordination normal.  Skin: Skin is dry. No rash noted. No erythema. No pallor.  Psychiatric: She has a normal mood and affect.          Assessment & Plan:

## 2011-02-14 NOTE — Assessment & Plan Note (Signed)
Poorly controlled Long disc about Dm diet Check sugar bid Add glucotrol xl once daily- watch for low sugar a1c and f/u 3 mo  Cannot afford eye exam

## 2011-02-14 NOTE — Assessment & Plan Note (Signed)
Pt is not ready to consult surgeon due to cost Overall asymptomatic  Continue to watch ca and phos D is corrected Cannot afford dexa Pt will let me know when she can go for ref

## 2011-02-14 NOTE — Patient Instructions (Signed)
Start vitamin D3 over the counter 2000 international units (iU ) per day  Add glucotrol XL 5 mg in am  Watch sugar twice daily- update me if you have low ones Get going with the diabetic diet and weight loss  Let me know when you are ready for a surgeon referral to discuss parathyroid problem  augmentin for the sinus infection  Nasal saline spray for nasal congestion  Update me if not improved  Schedule non fasting lab in 3 months and follow up

## 2011-02-16 NOTE — Assessment & Plan Note (Signed)
[facility]                            CARDIOLOGY OFFICE NOTE   Amy Bryant, Amy Bryant                       MRN:          [id number]  DATE:12/14/2008                            DOB:          1956-09-03    IDENTIFICATION:  [name] is a 55 year old woman who was recently  discharged from our service (February 22-26, 2010).  She presented with  atrial fibrillation with a rapid ventricular response.   On February 25, she underwent cardioversion.  She was sent home on  Coumadin.   Since being at home, she has denied palpitations.  She denies dizziness.  Denies chest pain.   Note, she is working on losing weight and her weight is down 11 pounds.   CURRENT MEDICINES:  1. Advair 250/50 b.i.d.  2. Albuterol inhaler p.r.n.  3. Lasix 80 b.i.d.  4. K-Dur 20 mEq b.i.d. 3 liters nasal cannula.  5. Coumadin as directed.  6. Omeprazole 20.  7. Nasonex.  8. Synthroid 100 mcg.  9. Diltiazem CD 120 .   PHYSICAL EXAMINATION:  GENERAL:  On exam, the patient is in no distress  at rest.  VITAL SIGNS;  Blood pressure 149/73, pulse is 90 and regular, weight  419.  LUNGS:  Relatively clear.  No wheezing.  CARDIAC:  Regular rate and rhythm.  S1 and S2.  No significant murmurs.  ABDOMEN:  Obese.  No right upper quadrant tenderness.  EXTREMITIES:  Edema 1+.   12-lead EKG, normal sinus rhythm, 96 beats per minute.   IMPRESSION:  1. Atrial fibrillation, remains in sinus rhythm, should be maintained      on Coumadin therapy.  We will need to follow clinically and see how      she does.  I think she has had increased risk for having problems      especially with her pulmonary issues.  2. Pulmonary.  Followed by Dr. Marcelyn Bruins in our Pulmonary GI      Clinic, though not sure when the last clinic visit was.  3. Hypothyroidism.  This will need to be followed by Dr. [name].  4. Type 2 diabetes followed by Dr. [name].  5. Dyslipidemia.  We will need to address the  patient is not on the      statin.  6. History of deep vein thrombosis with pulmonary embolus.  Again,      continue chronic Coumadin.  7. Obesity with hypoventilation syndrome and congratulated her on her      weight loss.   I will set to see the patient back later this summer.  She will continue  on a Coumadin Clinic.  We changed the date a few weeks as she cannot  afford the co-pays.  Her INR has been in the upper ends of therapeutic.     Pricilla Riffle, MD, [facility]  Electronically Signed    PVR/MedQ  DD: 12/14/2008  DT: 12/15/2008  Job #: [id number]   cc:   Marne A. [name], MD

## 2011-02-16 NOTE — Discharge Summary (Signed)
NAMESARI, COGAN NO.:  [id number]   MEDICAL RECORD NO.:  [id number]          PATIENT TYPE:  INP   LOCATION:  5713                         FACILITY:  [facility]   PHYSICIAN:  Marcelyn Bruins, M.D. [facility] DATE OF BIRTH:  [birth year]   DATE OF PROCEDURE:  DATE OF DISCHARGE:  07/31/2005                      STAT - MUST CHANGE TO CORRECT WORK TYPE   DISCHARGE DIAGNOSES:  1.  Acute-on-chronic respiratory failure secondary to obesity      hypoventilation syndrome and cor pulmonale.  2.  Questionable pulmonary embolus.  3.  Sinusitis.  4.  Gastroesophageal reflux disease.  5.  Hypothyroidism.   LABORATORY DATA:  On July 31, 2005, INR 2.7, PT 28.7.  On July 30, 2005, sodium 142, potassium 3.6, chloride 98, CO2 34, glucose 129, BUN 14,  creatinine 0.8, calcium 10.3.   RADIOLOGY:  1.  On July 24, 2005, sinus film demonstrating sinus inflammatory changes      appreciated about the left maxillary and right frontal sinus.  2.  On July 24, 2005, VQ scan demonstrating two large segmental perfusion      deficits without corresponding abnormality on chest x-ray.  Concerning      for high probability of pulmonary embolus.  3.  October July 24, 2005, portable chest x-ray demonstrating diffuse      peribronchial thickening without definite edema, infiltrate, effusion,      or pneumothorax.   HISTORY OF PRESENT ILLNESS:  Ms. Giaimo is a pleasant 56 year old white female  who presented to the pulmonary office with oxygen saturations of 78% on room  air and a Pa02 of 54 on 2 L nasal cannula.  She is a 55 year old  postmenopausal former smoker who carries a diagnosis of morbid obesity,  weighing in at over 330 pounds.  She has had chronic dyspnea for over two  years with acute symptoms noted for two weeks prior to admission on July 24, 2005.  She has had positive green nasal drainage and frontal sinus pain,  fever, chills, and chest pressure.  She is followed by Dr.  [name] in the  outpatient setting as well as Dr. Shelle Iron for her pulmonary needs.  Actually  in fact when she turned up to the office on July 24, 2005 for evaluation,  she was found to be quite hypoxic at 68% on room air.  Chest x-ray without  evidence of pneumonia.  She was admitted for further evaluation and  treatment of her hypoxic respiratory failure.   HOSPITAL COURSE:  Problem 1.  Acute-on-chronic respiratory failure secondary  to obesity hypoventilation syndrome and decompensated cor pulmonale.  Ms.  Oliveria was admitted to the medical ward.  She was given supplemental oxygen  and aggressive diuresis.  Since the time of admission, she has continued to  have a negative fluid balance after aggressive diuresis.  She is currently  reporting that her symptoms of dyspnea are much improved and maintaining  oxygen saturations 90-92% on 3 L of continuous oxygen.  Her actual weight at  the time of admission was noted to be 456.  With aggressive diuresis, it  appears as  though she had dropped down the lowest at 449.  There was some  concern about the accuracy of further weights, with most previous weight  being 464 pounds.  I doubt that is the case because of her clinical  improvement and decrease in peripheral edema.  Ultimately, the patient has  improved dramatically with aggressive diuresis and therefore will be sent  home on a maintenance dose of furosemide and nasal cannula oxygen.  She did  obtain a 2-D echocardiogram on July 24, 2005.  This was unfortunately a  technically difficulty study due to her body habitus, and we were unable to  obtain helpful information from this study.   Problem 2.  Questionable pulmonary embolus.  The patient, because of her  acute onset of dyspnea, underwent a VQ scan on July 24, 2005 which showed  a high probability of pulmonary embolus.  Given her body habitus, she was  unable to have a CT of the chest done.  Given her body habitus and high   probability of findings, she was empirically started on IV heparin and then  later converted to Coumadin.  She will be sent home on Coumadin and followed  at the Coumadin clinic, with plans of six months worth of anticoagulation.   Problem 3.  Acute sinusitis, identified by both physical exam and  diagnostically by sinus films.  She was treated empirically with nasal  hygiene with saline sprays as well as steroid nasal spray.  She initially  was given Augmentin empirically from the time of admission and will complete  three more days after discharge.   Problem 4.  Gastroesophageal reflux disease.  This is maintained on twice a  day proton pump inhibitors.  She will be sent home on b.i.d. proton pump  inhibitors, with followup with Dr. Shelle Iron to decide on whether or not she  should return to daily.   Problem 5.  Mild hypothyroidism with normal T4 and elevated TSH.  She will  be sent home on maintenance Synthroid for this.   DISCHARGE PHYSICAL EXAMINATION:  VITAL SIGNS:  Temperature 98.9, heart rate  80s-90s, respirations 18-20, blood pressure 105/63.  Oxygen saturations on 3  L nasal  cannula 92-94%.  CARDIAC:  Regular rate and rhythm.  ABDOMEN:  Large, nontender, positive bowel sounds.  LUNGS:  Diminished in the bases.  EXTREMITIES:  Markedly less edema compared with the time of admission.  NEUROLOGIC:  Grossly intact.   DIET:  She has been instructed to follow a low sodium diet as well as 1500  cc water restriction.  She has gone over this in detail with nutrition.   DISCHARGE MEDICATIONS:  1.  Augmentin 875 mg, one tablet twice a day x3 days.  2.  Advair 250/50, one puff twice a day.  3.  Saline nasal spray, one puff four times a day for three more days.  4.  Levothyroxine 75 mcg daily.  5.  Lasix 80 mg twice a day.  6.  K-Dur 20 mEq twice a day.  7.  Warfarin 5 mg tablets one time a day.  8.  Omeprazole 20 mg twice a day.  9.  Oxygen at 3 L all the time. 10. Rhinocort one  puff each nostril daily.   FOLLOW UP:  The patient is to follow up with Dr. Marcelyn Bruins on August 07, 2005 at 11:30 a.m.  Also, she has been advised to report early for  laboratory data.  Furthermore, she has follow up at the Coumadin  clinic on  August 02, 2005 at 3:30 p.m.  Directions were provided.   DISPOSITION:  The patient states that she is ready to be discharged to home.  Case management has been consulted to assist with setting the patient up  with home O2.      Anders Simmonds, N.P. [facility]    ______________________________  Marcelyn Bruins, M.D. [facility]    PB/MEDQ  D:  07/31/2005  T:  07/31/2005  Job:  [id number]

## 2011-02-22 ENCOUNTER — Encounter: Payer: BC Managed Care – PPO | Admitting: *Deleted

## 2011-03-08 ENCOUNTER — Ambulatory Visit (INDEPENDENT_AMBULATORY_CARE_PROVIDER_SITE_OTHER): Payer: BC Managed Care – PPO | Admitting: *Deleted

## 2011-03-08 DIAGNOSIS — Z8679 Personal history of other diseases of the circulatory system: Secondary | ICD-10-CM

## 2011-03-08 DIAGNOSIS — I2699 Other pulmonary embolism without acute cor pulmonale: Secondary | ICD-10-CM

## 2011-03-08 DIAGNOSIS — Z86718 Personal history of other venous thrombosis and embolism: Secondary | ICD-10-CM

## 2011-03-08 DIAGNOSIS — I4891 Unspecified atrial fibrillation: Secondary | ICD-10-CM

## 2011-03-08 LAB — POCT INR: INR: 2.8

## 2011-04-08 ENCOUNTER — Other Ambulatory Visit: Payer: Self-pay | Admitting: Internal Medicine

## 2011-04-08 ENCOUNTER — Other Ambulatory Visit: Payer: Self-pay | Admitting: Family Medicine

## 2011-04-09 NOTE — Telephone Encounter (Signed)
[facility] [address] electronically request refill Levothyroxine #30 with 3 refills given. Pt already has appt scheduled in 05/2011.

## 2011-04-12 ENCOUNTER — Ambulatory Visit (INDEPENDENT_AMBULATORY_CARE_PROVIDER_SITE_OTHER): Payer: BC Managed Care – PPO | Admitting: *Deleted

## 2011-04-12 DIAGNOSIS — Z8679 Personal history of other diseases of the circulatory system: Secondary | ICD-10-CM

## 2011-04-12 DIAGNOSIS — I2699 Other pulmonary embolism without acute cor pulmonale: Secondary | ICD-10-CM

## 2011-04-12 DIAGNOSIS — I4891 Unspecified atrial fibrillation: Secondary | ICD-10-CM

## 2011-04-12 DIAGNOSIS — Z86718 Personal history of other venous thrombosis and embolism: Secondary | ICD-10-CM

## 2011-04-12 LAB — POCT INR: INR: 2.2

## 2011-05-10 ENCOUNTER — Encounter: Payer: BC Managed Care – PPO | Admitting: *Deleted

## 2011-05-15 ENCOUNTER — Other Ambulatory Visit (INDEPENDENT_AMBULATORY_CARE_PROVIDER_SITE_OTHER): Payer: BC Managed Care – PPO | Admitting: Family Medicine

## 2011-05-15 DIAGNOSIS — E119 Type 2 diabetes mellitus without complications: Secondary | ICD-10-CM

## 2011-05-15 DIAGNOSIS — E213 Hyperparathyroidism, unspecified: Secondary | ICD-10-CM

## 2011-05-15 DIAGNOSIS — E039 Hypothyroidism, unspecified: Secondary | ICD-10-CM

## 2011-05-15 LAB — RENAL FUNCTION PANEL
Albumin: 4 g/dL (ref 3.5–5.2)
BUN: 19 mg/dL (ref 6–23)
CO2: 31 meq/L (ref 19–32)
Calcium: 11.4 mg/dL — ABNORMAL HIGH (ref 8.4–10.5)
Chloride: 104 meq/L (ref 96–112)
Creatinine, Ser: 0.7 mg/dL (ref 0.4–1.2)
GFR: 95.1 mL/min
Glucose, Bld: 148 mg/dL — ABNORMAL HIGH (ref 70–99)
Phosphorus: 1.9 mg/dL — ABNORMAL LOW (ref 2.3–4.6)
Potassium: 4 meq/L (ref 3.5–5.1)
Sodium: 142 meq/L (ref 135–145)

## 2011-05-15 LAB — TSH: TSH: 5.18 u[IU]/mL (ref 0.35–5.50)

## 2011-05-23 ENCOUNTER — Ambulatory Visit: Payer: BC Managed Care – PPO | Admitting: Family Medicine

## 2011-05-24 ENCOUNTER — Ambulatory Visit (INDEPENDENT_AMBULATORY_CARE_PROVIDER_SITE_OTHER): Payer: BC Managed Care – PPO | Admitting: *Deleted

## 2011-05-24 DIAGNOSIS — I2699 Other pulmonary embolism without acute cor pulmonale: Secondary | ICD-10-CM

## 2011-05-24 DIAGNOSIS — Z86718 Personal history of other venous thrombosis and embolism: Secondary | ICD-10-CM

## 2011-05-24 DIAGNOSIS — I4891 Unspecified atrial fibrillation: Secondary | ICD-10-CM

## 2011-05-24 DIAGNOSIS — Z8679 Personal history of other diseases of the circulatory system: Secondary | ICD-10-CM

## 2011-05-24 LAB — POCT INR: INR: 2.2

## 2011-05-25 ENCOUNTER — Encounter: Payer: Self-pay | Admitting: Family Medicine

## 2011-05-25 ENCOUNTER — Ambulatory Visit (INDEPENDENT_AMBULATORY_CARE_PROVIDER_SITE_OTHER): Payer: BC Managed Care – PPO | Admitting: Family Medicine

## 2011-05-25 DIAGNOSIS — E559 Vitamin D deficiency, unspecified: Secondary | ICD-10-CM

## 2011-05-25 DIAGNOSIS — E039 Hypothyroidism, unspecified: Secondary | ICD-10-CM

## 2011-05-25 DIAGNOSIS — E213 Hyperparathyroidism, unspecified: Secondary | ICD-10-CM

## 2011-05-25 DIAGNOSIS — E7800 Pure hypercholesterolemia, unspecified: Secondary | ICD-10-CM

## 2011-05-25 DIAGNOSIS — K219 Gastro-esophageal reflux disease without esophagitis: Secondary | ICD-10-CM

## 2011-05-25 DIAGNOSIS — E119 Type 2 diabetes mellitus without complications: Secondary | ICD-10-CM

## 2011-05-25 NOTE — Assessment & Plan Note (Signed)
Much imp with addn of glipizide No problems or hypoglycemia  a1c down to 6.2 Disc DM diet and wt loss Cannot afford eye exam at this time  Pt states vision is good

## 2011-05-25 NOTE — Assessment & Plan Note (Signed)
Has been well controlled Check 6 mo and f/u  Diet is fair- rev low sat fat diet

## 2011-05-25 NOTE — Assessment & Plan Note (Signed)
High ca and low phos- is a little worse Pt occ gets a cramp but not often Cannot afford specialist visit at this time but wants to go next year Will keep me updated Taking vit D  Level in 6 mo

## 2011-05-25 NOTE — Progress Notes (Signed)
Subjective:    Patient ID: Amy Bryant, female    DOB: 08-Nov-1955, 55 y.o.   MRN: [id number]  HPI Here for f/u of hypothyroidism/ hyperparathyroidism and DM No new medical changes  Enjoying grandchildren   DM is much better controlled with glucotrol xl Lowest sugar was 89  Sugars are great - average 118 Diet - is cutting back on her fruit- was eating too much  opthy- could not afford -- will let me know when she can afford to go  a1c is down to 6.2 Notices her vision is improved   Hyperparathyroid- pt has not been able to afford specialist visit Ca is stable at 11.4 and phos low at 1.9 Is taking vit D Thinks she can afford to see one next year    Thyroid ok  Lab Results  Component Value Date   TSH 5.18 05/15/2011   no clinical changes-- fatigue does not change   Taking one prilosec per day  Not working as well  ? Needs to change or go to bid  Some regurgitation and heartburn  This sometimes aff her throat and makes her hoarse  Patient Active Problem List  Diagnoses  . HYPOTHYROIDISM  . DIABETES MELLITUS, MILD  . HYPERPARATHYROIDISM UNSPECIFIED  . PURE HYPERCHOLESTEROLEMIA  . OBESITY  . OBESITY HYPOVENTILATION SYNDROME  . COR PULMONALE  . REACTIVE AIRWAY DISEASE  . GERD  . INTERTRIGO  . DISC DISEASE, LUMBAR  . PULMONARY EMBOLISM, HX OF  . ATRIAL FIBRILLATION, HX OF  . DYSURIA, HX OF  . TOBACCO USE, QUIT  . UNSPECIFIED VITAMIN D DEFICIENCY  . Atrial fibrillation  . Pulmonary embolus  . Acute sinusitis   Past Medical History  Diagnosis Date  . GERD (gastroesophageal reflux disease)   . Pulmonary embolism     hx of on coumadin  . Morbid obesity   . Reactive airways dysfunction syndrome   . Cor pulmonale     and obesity hypoventilation syndrome  . Hypothyroid   . OA (osteoarthritis)   . Atrial fibrillation     with cardioversion success   Past Surgical History  Procedure Date  . Tonsillectomy    History  Substance Use Topics  . Smoking status:  Former Games developer  . Smokeless tobacco: Not on file  . Alcohol Use:    Family History  Problem Relation Age of Onset  . Heart disease Father 110    MI  . Diabetes Brother    Allergies  Allergen Reactions  . Pseudoephedrine     REACTION: tightness in chest   Current Outpatient Prescriptions on File Prior to Visit  Medication Sig Dispense Refill  . ciclopirox (LOPROX) 0.77 % cream APPLY TO AFFECTED AREAS 2 TIMES A DAY AS NEEDED  90 g  3  . diclofenac (VOLTAREN) 75 MG EC tablet Take 75 mg by mouth 2 (two) times daily. with food as needed for pain.       Marland Kitchen diltiazem (TIAZAC) 120 MG 24 hr capsule Take 120 mg by mouth daily.        . Fluticasone-Salmeterol (ADVAIR DISKUS) 250-50 MCG/DOSE AEPB Inhale 1 puff into the lungs every 12 (twelve) hours.        . furosemide (LASIX) 80 MG tablet Take 80 mg by mouth 2 (two) times daily.        Marland Kitchen glipiZIDE (GLUCOTROL XL) 5 MG 24 hr tablet Take 1 tablet (5 mg total) by mouth daily.  30 tablet  11  . glucose blood test strip Test  blood sugar once daily and as needed for DM 250.0       . Lancets (ONETOUCH ULTRASOFT) lancets Test blood sugar once daily and as needed for DM 250.0       . levothyroxine (SYNTHROID, LEVOTHROID) 137 MCG tablet TAKE ONE TABLET BY MOUTH EVERY DAY  30 tablet  3  . loratadine (CLARITIN) 10 MG tablet OTC as directed.       . metFORMIN (GLUCOPHAGE) 1000 MG tablet Take 1,000 mg by mouth 2 (two) times daily with a meal.        . mometasone (ELOCON) 0.1 % cream Tiny amount to each ear canal twice weekly as needed.       . mometasone (NASONEX) 50 MCG/ACT nasal spray 1 spray by Nasal route daily.        . mupirocin (BACTROBAN) 2 % ointment APPLY TO AFFECTED AREA TWO TIMES A DAY AS NEEDED  30 g  3  . omeprazole (PRILOSEC) 20 MG capsule Take 20 mg by mouth 2 (two) times daily.       . potassium chloride SA (K-DUR,KLOR-CON) 20 MEQ tablet Take 40 mEq by mouth daily.        . simvastatin (ZOCOR) 20 MG tablet Take 20 mg by mouth at bedtime.          Marland Kitchen warfarin (COUMADIN) 5 MG tablet TAKE AS DIRECTED BY ANTICOAGULATION CLINIC  30 tablet  2  . ergocalciferol (DRISDOL) 50000 UNITS capsule Take 50,000 Units by mouth once a week.        . Multiple Vitamin (MULTIVITAMIN) capsule Take 1 capsule by mouth daily.              Review of Systems Review of Systems  Constitutional: Negative for fever, appetite change,  and unexpected weight change. pos for generalized fatigue Eyes: Negative for pain and visual disturbance.  Respiratory: Negative for cough and shortness of breath.   Cardiovascular: Negative.  For cp or palpitations Gastrointestinal: Negative for nausea, diarrhea and constipation.  Genitourinary: Negative for urgency and frequency.  Skin: Negative for pallor. or rash MSK pos for joint pain   Neurological: Negative for weakness, light-headedness, numbness and headaches.  Hematological: Negative for adenopathy. Does not bruise/bleed easily.  Psychiatric/Behavioral: Negative for dysphoric mood. The patient is not nervous/anxious.          Objective:   Physical Exam  Constitutional: She appears well-developed and well-nourished. No distress.       Morbidly obese and well appearing in wheelchair  HENT:  Head: Normocephalic and atraumatic.  Mouth/Throat: Oropharynx is clear and moist.  Eyes: Conjunctivae and EOM are normal. Pupils are equal, round, and reactive to light.  Neck: Normal range of motion. Neck supple. No JVD present. Carotid bruit is not present. No thyromegaly present.  Cardiovascular: Normal rate, regular rhythm, normal heart sounds and intact distal pulses.   Pulmonary/Chest: Effort normal and breath sounds normal. No respiratory distress. She has no wheezes.  Abdominal: Soft. Bowel sounds are normal. She exhibits no distension and no mass. There is no tenderness.  Musculoskeletal: Normal range of motion. She exhibits no edema and no tenderness.  Lymphadenopathy:    She has no cervical adenopathy.   Neurological: She is alert. She has normal reflexes. Coordination normal.  Skin: Skin is warm and dry. No rash noted. No erythema. No pallor.  Psychiatric: She has a normal mood and affect.          Assessment & Plan:

## 2011-05-25 NOTE — Patient Instructions (Signed)
Increase the prilosec 20 mg to twice daily  Update me if that does not work  Work on diabetic diet and weight loss Let me know when you can afford specialist referral for hyperparathyroid and also for your yearly eye exam No other medicine changes  Follow up in 6 months with labs prior

## 2011-05-25 NOTE — Assessment & Plan Note (Signed)
tsh is tx and stable No clinical changes Fatigue is chronic

## 2011-06-28 ENCOUNTER — Encounter: Payer: BC Managed Care – PPO | Admitting: *Deleted

## 2011-07-12 ENCOUNTER — Encounter: Payer: BC Managed Care – PPO | Admitting: *Deleted

## 2011-07-15 ENCOUNTER — Other Ambulatory Visit: Payer: Self-pay | Admitting: Internal Medicine

## 2011-07-19 ENCOUNTER — Encounter: Payer: BC Managed Care – PPO | Admitting: *Deleted

## 2011-07-20 ENCOUNTER — Ambulatory Visit (INDEPENDENT_AMBULATORY_CARE_PROVIDER_SITE_OTHER): Payer: BC Managed Care – PPO | Admitting: *Deleted

## 2011-07-20 DIAGNOSIS — Z8679 Personal history of other diseases of the circulatory system: Secondary | ICD-10-CM

## 2011-07-20 DIAGNOSIS — I2699 Other pulmonary embolism without acute cor pulmonale: Secondary | ICD-10-CM

## 2011-07-20 DIAGNOSIS — I4891 Unspecified atrial fibrillation: Secondary | ICD-10-CM

## 2011-07-20 DIAGNOSIS — Z86718 Personal history of other venous thrombosis and embolism: Secondary | ICD-10-CM

## 2011-08-13 ENCOUNTER — Other Ambulatory Visit: Payer: Self-pay | Admitting: Family Medicine

## 2011-08-20 ENCOUNTER — Other Ambulatory Visit: Payer: Self-pay | Admitting: Internal Medicine

## 2011-08-20 MED ORDER — WARFARIN SODIUM 5 MG PO TABS: ORAL_TABLET | ORAL | Status: DC

## 2011-08-31 ENCOUNTER — Encounter: Payer: BC Managed Care – PPO | Admitting: *Deleted

## 2011-09-07 ENCOUNTER — Ambulatory Visit (INDEPENDENT_AMBULATORY_CARE_PROVIDER_SITE_OTHER): Payer: PRIVATE HEALTH INSURANCE | Admitting: *Deleted

## 2011-09-07 DIAGNOSIS — Z8679 Personal history of other diseases of the circulatory system: Secondary | ICD-10-CM

## 2011-09-07 DIAGNOSIS — I2699 Other pulmonary embolism without acute cor pulmonale: Secondary | ICD-10-CM

## 2011-09-07 DIAGNOSIS — I4891 Unspecified atrial fibrillation: Secondary | ICD-10-CM

## 2011-09-07 DIAGNOSIS — Z86718 Personal history of other venous thrombosis and embolism: Secondary | ICD-10-CM

## 2011-09-07 LAB — POCT INR: INR: 2.8

## 2011-09-14 ENCOUNTER — Telehealth: Payer: Self-pay | Admitting: Internal Medicine

## 2011-09-14 MED ORDER — MOMETASONE FUROATE 50 MCG/ACT NA SUSP: 1.0000 | Freq: Every day | NASAL | Status: DC

## 2011-09-14 NOTE — Telephone Encounter (Signed)
I will px it and sign for generic Px written for call in  Because she did not leave pharmacy name

## 2011-09-14 NOTE — Telephone Encounter (Signed)
Patient would like a refill on her Nasonex but would like the genetic because brand name costs too much.

## 2011-09-17 NOTE — Telephone Encounter (Signed)
Medication phoned to [facility] pharmacy as instructed.Patient notified as instructed by telephone.

## 2011-09-17 NOTE — Telephone Encounter (Signed)
Patient would like this Rx sent to [facility].

## 2011-09-18 ENCOUNTER — Telehealth: Payer: Self-pay | Admitting: Internal Medicine

## 2011-09-18 MED ORDER — FLUTICASONE PROPIONATE 50 MCG/ACT NA SUSP: 2.0000 | Freq: Every day | NASAL | Status: DC

## 2011-09-18 NOTE — Telephone Encounter (Signed)
Pt has already left work. Left message on answering machine at home to ck for request at [facility] [address]. Any questions pt asked to call back.

## 2011-09-18 NOTE — Telephone Encounter (Signed)
Patient called and stated that Nasonex generic was called into her pharmacy and the pharmacist stated that there is no generic for Nasonex.  So she wanted to know if we could call in Flonase because there is a generic for that.  Please advise.

## 2011-09-18 NOTE — Telephone Encounter (Signed)
That is fine  Will refill electronically to pharmacy listed

## 2011-10-16 ENCOUNTER — Other Ambulatory Visit: Payer: Self-pay | Admitting: Internal Medicine

## 2011-10-19 ENCOUNTER — Encounter: Payer: PRIVATE HEALTH INSURANCE | Admitting: *Deleted

## 2011-10-23 ENCOUNTER — Other Ambulatory Visit: Payer: Self-pay | Admitting: Family Medicine

## 2011-10-23 NOTE — Telephone Encounter (Signed)
[facility] request refill furosemide 80 mg #60 x 11.

## 2011-10-26 ENCOUNTER — Encounter: Payer: PRIVATE HEALTH INSURANCE | Admitting: *Deleted

## 2011-10-30 ENCOUNTER — Ambulatory Visit (INDEPENDENT_AMBULATORY_CARE_PROVIDER_SITE_OTHER): Payer: PRIVATE HEALTH INSURANCE | Admitting: *Deleted

## 2011-10-30 DIAGNOSIS — Z8679 Personal history of other diseases of the circulatory system: Secondary | ICD-10-CM

## 2011-10-30 DIAGNOSIS — I2699 Other pulmonary embolism without acute cor pulmonale: Secondary | ICD-10-CM

## 2011-10-30 DIAGNOSIS — I4891 Unspecified atrial fibrillation: Secondary | ICD-10-CM

## 2011-10-30 DIAGNOSIS — Z86718 Personal history of other venous thrombosis and embolism: Secondary | ICD-10-CM

## 2011-11-08 ENCOUNTER — Other Ambulatory Visit: Payer: Self-pay | Admitting: Family Medicine

## 2011-11-08 ENCOUNTER — Ambulatory Visit (INDEPENDENT_AMBULATORY_CARE_PROVIDER_SITE_OTHER): Payer: PRIVATE HEALTH INSURANCE | Admitting: Family Medicine

## 2011-11-08 ENCOUNTER — Encounter: Payer: Self-pay | Admitting: Family Medicine

## 2011-11-08 DIAGNOSIS — R05 Cough: Secondary | ICD-10-CM

## 2011-11-08 MED ORDER — CLARITHROMYCIN 500 MG PO TABS: 500.0000 mg | ORAL_TABLET | Freq: Two times a day (BID) | ORAL | Status: AC

## 2011-11-08 MED ORDER — ALBUTEROL SULFATE (2.5 MG/3ML) 0.083% IN NEBU: 2.5000 mg | INHALATION_SOLUTION | RESPIRATORY_TRACT | Status: DC | PRN

## 2011-11-08 NOTE — Patient Instructions (Signed)
Start the antibiotics today.  Use the nebs every 4 hours as needed.  I don't think you'll need the prednisone.  Take care.

## 2011-11-08 NOTE — Telephone Encounter (Signed)
[facility] refill Klor con 20 meq #60 x 11.

## 2011-11-08 NOTE — Progress Notes (Signed)
Obese O2 dependent.  Sx started about 1 week ago. Started with nasal sx, now with chest tightness.  Some sputum, green and thick, changed from baseline.  No fevers.  No ear pain.  More wheeze than normal.  No ST.  No neck pain.  Still stuffy.  On O2 at baseline.  Sugar has been controlled per patient.  On coumadin.    Meds, vitals, and allergies reviewed.   ROS: See HPI.  Otherwise, noncontributory.  Obese, on O2, nad ncat Tm wnl, nasal exam w/o purulent discharge Mmm Neck supple Exam limited by habitus but rrr, coarse BS with scant wheeze, last SABA use 3 hours before exam.  No inc in wob No cyanosis

## 2011-11-09 DIAGNOSIS — R05 Cough: Secondary | ICD-10-CM | POA: Insufficient documentation

## 2011-11-09 NOTE — Assessment & Plan Note (Signed)
Given her hx and exam, start biaxin.  D/w pt about coumadin adjustment and she didn't want me to adjust, follow coumadin.  She'll call coumadin clinic about abx and adjustment.  She is aware of potential catastrophe with INR elevation and assumes responsibility for calling coumadin clinic about monitoring.  I offered to call for her.  I would not start pred given her hx DM and the relative lack of wheeze.  She understood.

## 2011-11-16 ENCOUNTER — Other Ambulatory Visit: Payer: Self-pay

## 2011-11-16 MED ORDER — SIMVASTATIN 20 MG PO TABS: 20.0000 mg | ORAL_TABLET | Freq: Every day | ORAL | Status: DC

## 2011-11-16 NOTE — Telephone Encounter (Signed)
[facility] request Simvastatin 20 mg #30 x 11.

## 2011-11-22 ENCOUNTER — Other Ambulatory Visit (INDEPENDENT_AMBULATORY_CARE_PROVIDER_SITE_OTHER): Payer: PRIVATE HEALTH INSURANCE

## 2011-11-22 DIAGNOSIS — E213 Hyperparathyroidism, unspecified: Secondary | ICD-10-CM

## 2011-11-22 DIAGNOSIS — E7800 Pure hypercholesterolemia, unspecified: Secondary | ICD-10-CM

## 2011-11-22 DIAGNOSIS — E119 Type 2 diabetes mellitus without complications: Secondary | ICD-10-CM

## 2011-11-22 DIAGNOSIS — E559 Vitamin D deficiency, unspecified: Secondary | ICD-10-CM

## 2011-11-22 LAB — COMPREHENSIVE METABOLIC PANEL
AST: 22 U/L (ref 0–37)
Albumin: 4.1 g/dL (ref 3.5–5.2)
BUN: 16 mg/dL (ref 6–23)
Calcium: 12.4 mg/dL — ABNORMAL HIGH (ref 8.4–10.5)
Chloride: 102 mEq/L (ref 96–112)
Creatinine, Ser: 0.8 mg/dL (ref 0.4–1.2)
Glucose, Bld: 176 mg/dL — ABNORMAL HIGH (ref 70–99)

## 2011-11-22 LAB — LIPID PANEL
Cholesterol: 151 mg/dL (ref 0–200)
HDL: 36.6 mg/dL — ABNORMAL LOW (ref >39.00)
Triglycerides: 218 mg/dL — ABNORMAL HIGH (ref 0.0–149.0)

## 2011-11-23 LAB — VITAMIN D 25 HYDROXY (VIT D DEFICIENCY, FRACTURES): Vit D, 25-Hydroxy: 28 ng/mL — ABNORMAL LOW (ref 30–89)

## 2011-11-28 ENCOUNTER — Encounter: Payer: Self-pay | Admitting: Family Medicine

## 2011-11-28 ENCOUNTER — Ambulatory Visit (INDEPENDENT_AMBULATORY_CARE_PROVIDER_SITE_OTHER): Payer: PRIVATE HEALTH INSURANCE | Admitting: Family Medicine

## 2011-11-28 VITALS — BP 110/64 | HR 84 | Temp 97.9°F

## 2011-11-28 DIAGNOSIS — E669 Obesity, unspecified: Secondary | ICD-10-CM

## 2011-11-28 DIAGNOSIS — E213 Hyperparathyroidism, unspecified: Secondary | ICD-10-CM

## 2011-11-28 DIAGNOSIS — E119 Type 2 diabetes mellitus without complications: Secondary | ICD-10-CM

## 2011-11-28 DIAGNOSIS — E7800 Pure hypercholesterolemia, unspecified: Secondary | ICD-10-CM

## 2011-11-28 DIAGNOSIS — E039 Hypothyroidism, unspecified: Secondary | ICD-10-CM

## 2011-11-28 DIAGNOSIS — E559 Vitamin D deficiency, unspecified: Secondary | ICD-10-CM

## 2011-11-28 LAB — HM DIABETES EYE EXAM

## 2011-11-28 MED ORDER — FLUTICASONE-SALMETEROL 250-50 MCG/DOSE IN AEPB: 1.0000 | INHALATION_SPRAY | Freq: Two times a day (BID) | RESPIRATORY_TRACT | Status: DC

## 2011-11-28 MED ORDER — METFORMIN HCL 1000 MG PO TABS: 1000.0000 mg | ORAL_TABLET | Freq: Two times a day (BID) | ORAL | Status: DC

## 2011-11-28 MED ORDER — DICLOFENAC SODIUM 75 MG PO TBEC: 75.0000 mg | DELAYED_RELEASE_TABLET | Freq: Two times a day (BID) | ORAL | Status: DC

## 2011-11-28 NOTE — Patient Instructions (Addendum)
I think your bronchitis is getting better - update me if worse wheeze or cough  Increase your vitamin D to 4000 iu daily over the counter  Labs are stable but parathyroid problem is worsening  When you can afford eye exam and referral for pararthyroid - let me know  Schedule annual exam and labs prior in 6 months

## 2011-11-28 NOTE — Assessment & Plan Note (Signed)
Level is 28 - will inc dose otc to 4000 iu daily  Re check 6 mo at annual exam

## 2011-11-28 NOTE — Assessment & Plan Note (Signed)
Disc goals for lipids and reasons to control them Rev labs with pt Rev low sat fat diet in detail Stable on statin and diet  Needs to be better with diet overall  Needs exercise to raise HDL

## 2011-11-28 NOTE — Assessment & Plan Note (Signed)
Stable tsh summer - no problems  No clinical changes

## 2011-11-28 NOTE — Assessment & Plan Note (Signed)
Discussed how this problem influences overall health and the risks it imposes  As of this time many/most of her health problems are related to morbid obesity  Understand exercise is limited due to hypoxia  Reviewed plan for weight loss with lower calorie diet (via better food choices and also portion control or program like weight watchers) and exercise building up to or more than 30 minutes 5 days per week including some aerobic activity   unfortunately, for whatever reason pt is just not motivated for lifestyle change I did offer her psychological counseling if it would help  In the future

## 2011-11-28 NOTE — Assessment & Plan Note (Signed)
Ca is higher - pt is relatively symptom free With high deductible - she cannot afford eval or tx of this still  Will update me with symptoms Aware of risks of untreated condition

## 2011-11-28 NOTE — Progress Notes (Signed)
Subjective:    Patient ID: Amy Bryant, female    DOB: 05/21/1956, 56 y.o.   MRN: [id number]  HPI Here for f/u of chronic conditions   Was seen recently for bronchitis-covered with biaxin Is slowly getting better  Some wheezing  Got new mask for cpap machine - that helps Whole office is full of smoke- that hurts her (feels better when home)   bp 110/64- still too low for ace  Hyperparathyroid Ca is up to 12.4 Symptoms-a little neck spasm occas- otherwise not any cramps  Has not been able to afford treatment / eval of this -- bad ins this year- high deductible    Vit D def-- 28  Told to take 2000 iu daily  Will inc to 4000 iu    Hypothyroid Lab Results  Component Value Date   TSH 5.18 05/15/2011   clinically  Diabetes Home sugar results -- running 110-150   (occ in the 80s)  No hypoglyc sympt  DM diet - diet is fair -- not following DM diet- but tries too , not motivated enough , tries to eat sugar free things  Needs to eat cleaner - less processed  Exercise - wheelchair bound - needs to do some arm exercise- gets sob  Symptoms--none  A1C last 6.4 up from 6.2 overall stable No problems with medications - glipizide and metformin Renal protection- bp too low for ace Last eye exam - cannot afford one - knows she needs to   Lipids are controled on zocor Lab Results  Component Value Date   CHOL 151 11/22/2011   HDL 36.60* 11/22/2011   LDLCALC 88 02/08/2011   LDLDIRECT 95.7 11/22/2011   TRIG 218.0* 11/22/2011   CHOLHDL 4 11/22/2011    Needs to get more exercise -- for HDL   Not motivated enough for wt loss     Patient Active Problem List  Diagnoses  . HYPOTHYROIDISM  . DIABETES MELLITUS, MILD  . HYPERPARATHYROIDISM UNSPECIFIED  . PURE HYPERCHOLESTEROLEMIA  . OBESITY  . OBESITY HYPOVENTILATION SYNDROME  . COR PULMONALE  . REACTIVE AIRWAY DISEASE  . GERD  . INTERTRIGO  . DISC DISEASE, LUMBAR  . PULMONARY EMBOLISM, HX OF  . ATRIAL FIBRILLATION, HX OF  .  DYSURIA, HX OF  . TOBACCO USE, QUIT  . UNSPECIFIED VITAMIN D DEFICIENCY  . Atrial fibrillation  . Pulmonary embolus  . Cough   Past Medical History  Diagnosis Date  . GERD (gastroesophageal reflux disease)   . Pulmonary embolism     hx of on coumadin  . Morbid obesity   . Reactive airways dysfunction syndrome   . Cor pulmonale     and obesity hypoventilation syndrome  . Hypothyroid   . OA (osteoarthritis)   . Atrial fibrillation     with cardioversion success   Past Surgical History  Procedure Date  . Tonsillectomy    History  Substance Use Topics  . Smoking status: Former Games developer  . Smokeless tobacco: Not on file  . Alcohol Use:    Family History  Problem Relation Age of Onset  . Heart disease Father 24    MI  . Diabetes Brother    Allergies  Allergen Reactions  . Pseudoephedrine     REACTION: tightness in chest   Current Outpatient Prescriptions on File Prior to Visit  Medication Sig Dispense Refill  . albuterol (PROVENTIL) (2.5 MG/3ML) 0.083% nebulizer solution Take 3 mLs (2.5 mg total) by nebulization every 4 (four) hours as needed for wheezing.  150 mL  1  . ciclopirox (LOPROX) 0.77 % cream APPLY TO AFFECTED AREAS 2 TIMES A DAY AS NEEDED  90 g  3  . diltiazem (TIAZAC) 120 MG 24 hr capsule Take 120 mg by mouth daily.        . fluticasone (FLONASE) 50 MCG/ACT nasal spray Place 2 sprays into the nose daily.  16 g  11  . furosemide (LASIX) 80 MG tablet TAKE 1 TABLET BY MOUTH TWO TIMES A DAY  60 tablet  11  . glipiZIDE (GLUCOTROL XL) 5 MG 24 hr tablet Take 1 tablet (5 mg total) by mouth daily.  30 tablet  11  . glucose blood test strip Test blood sugar once daily and as needed for DM 250.0       . KLOR-CON M20 20 MEQ tablet TAKE 2 TABS BY MOUTH ONCE DAILY  60 tablet  11  . Lancets (ONETOUCH ULTRASOFT) lancets Test blood sugar once daily and as needed for DM 250.0       . levothyroxine (SYNTHROID, LEVOTHROID) 137 MCG tablet TAKE ONE TABLET BY MOUTH EVERY DAY  30  tablet  11  . loratadine (CLARITIN) 10 MG tablet OTC as directed.       . mometasone (ELOCON) 0.1 % cream Tiny amount to each ear canal twice weekly as needed.       . Multiple Vitamin (MULTIVITAMIN) capsule Take 1 capsule by mouth daily.        . mupirocin (BACTROBAN) 2 % ointment APPLY TO AFFECTED AREA TWO TIMES A DAY AS NEEDED  30 g  3  . omeprazole (PRILOSEC) 20 MG capsule Take 20 mg by mouth 2 (two) times daily.       . simvastatin (ZOCOR) 20 MG tablet Take 1 tablet (20 mg total) by mouth at bedtime.  30 tablet  11  . warfarin (COUMADIN) 5 MG tablet TAKE AS DIRECTED BY MOUTH PER ANTICOAGULATION CLINIC  40 tablet  3      Review of Systems Review of Systems  Constitutional: Negative for fever, appetite change,  and unexpected weight change. pos for fatigue Eyes: Negative for pain and visual disturbance.  Respiratory: Negative for cough and pos for wheeze that is improving / pos for sob on exertion-baseline    Cardiovascular: Negative for cp or palpitations    Gastrointestinal: Negative for nausea, diarrhea and constipation.  Genitourinary: Negative for urgency and frequency.  Skin: Negative for pallor or rash   MSK neg for muscle cramps or spasms Neurological: Negative for weakness, light-headedness, numbness and headaches.  Hematological: Negative for adenopathy. Does not bruise/bleed easily.  Psychiatric/Behavioral: Negative for dysphoric mood. The patient is not nervous/anxious.          Objective:   Physical Exam  Constitutional: She appears well-developed and well-nourished. No distress.       Morbidly obese/ in wheelchair/ unable to ambulate without assistance   HENT:  Head: Normocephalic and atraumatic.  Mouth/Throat: Oropharynx is clear and moist.  Eyes: Conjunctivae and EOM are normal. Pupils are equal, round, and reactive to light. Right eye exhibits no discharge. Left eye exhibits no discharge. No scleral icterus.  Neck: Normal range of motion. No JVD present.  Carotid bruit is not present. No thyromegaly present.  Cardiovascular: Normal rate, regular rhythm, normal heart sounds and intact distal pulses.  Exam reveals no gallop.   Pulmonary/Chest: Effort normal. No respiratory distress. She has wheezes. She has no rales. She exhibits no tenderness.       Diffusely  distant bs bs are harsh at bases Wheeze only on forced exp No dyspnea  Abdominal: Soft. Bowel sounds are normal. She exhibits no distension, no abdominal bruit and no mass. There is no tenderness.       Large pannus  Musculoskeletal: She exhibits edema. She exhibits no tenderness.       Baseline non pitting edema    Lymphadenopathy:    She has no cervical adenopathy.  Neurological: She is alert. She has normal reflexes. She displays no atrophy and no tremor. No cranial nerve deficit. She exhibits normal muscle tone. Coordination normal.       No muscle fasciculations or spasms   Skin: Skin is warm and dry. No rash noted. No erythema. No pallor.  Psychiatric: She has a normal mood and affect.          Assessment & Plan:

## 2011-11-28 NOTE — Assessment & Plan Note (Signed)
Well controlled with glipizide and metformin  Rev low glycemic diet-needs to work on this and wt loss  No ace due to low bp  Cannot afford eye exam

## 2011-12-13 ENCOUNTER — Other Ambulatory Visit: Payer: Self-pay | Admitting: Family Medicine

## 2011-12-14 NOTE — Telephone Encounter (Signed)
[facility] request refill diltiazem 120 mg 24 hr cap #30 x 11.

## 2011-12-21 ENCOUNTER — Ambulatory Visit (INDEPENDENT_AMBULATORY_CARE_PROVIDER_SITE_OTHER): Payer: PRIVATE HEALTH INSURANCE

## 2011-12-21 DIAGNOSIS — I2699 Other pulmonary embolism without acute cor pulmonale: Secondary | ICD-10-CM

## 2011-12-21 DIAGNOSIS — Z8679 Personal history of other diseases of the circulatory system: Secondary | ICD-10-CM

## 2011-12-21 DIAGNOSIS — I4891 Unspecified atrial fibrillation: Secondary | ICD-10-CM

## 2011-12-21 DIAGNOSIS — Z86718 Personal history of other venous thrombosis and embolism: Secondary | ICD-10-CM

## 2011-12-21 LAB — POCT INR: INR: 2.5

## 2011-12-25 ENCOUNTER — Other Ambulatory Visit: Payer: Self-pay | Admitting: *Deleted

## 2011-12-25 MED ORDER — MUPIROCIN 2 % EX OINT: TOPICAL_OINTMENT | CUTANEOUS | Status: DC

## 2011-12-25 NOTE — Telephone Encounter (Signed)
Will refill electronically  

## 2011-12-25 NOTE — Telephone Encounter (Signed)
Faxed request from [facility] [address], last filled 07/25/11.

## 2012-01-28 ENCOUNTER — Other Ambulatory Visit: Payer: Self-pay | Admitting: *Deleted

## 2012-01-28 MED ORDER — GLIPIZIDE ER 5 MG PO TB24: 5.0000 mg | ORAL_TABLET | Freq: Every day | ORAL | Status: DC

## 2012-01-28 NOTE — Telephone Encounter (Signed)
Received faxed refill request from pharmacy. Refill sent to pharmacy electronically. 

## 2012-02-08 ENCOUNTER — Ambulatory Visit (INDEPENDENT_AMBULATORY_CARE_PROVIDER_SITE_OTHER): Payer: PRIVATE HEALTH INSURANCE | Admitting: Pharmacist

## 2012-02-08 DIAGNOSIS — I2699 Other pulmonary embolism without acute cor pulmonale: Secondary | ICD-10-CM

## 2012-02-08 DIAGNOSIS — Z86718 Personal history of other venous thrombosis and embolism: Secondary | ICD-10-CM

## 2012-02-08 DIAGNOSIS — I4891 Unspecified atrial fibrillation: Secondary | ICD-10-CM

## 2012-02-08 DIAGNOSIS — Z8679 Personal history of other diseases of the circulatory system: Secondary | ICD-10-CM

## 2012-02-26 ENCOUNTER — Other Ambulatory Visit: Payer: Self-pay | Admitting: Internal Medicine

## 2012-03-21 ENCOUNTER — Ambulatory Visit (INDEPENDENT_AMBULATORY_CARE_PROVIDER_SITE_OTHER): Payer: PRIVATE HEALTH INSURANCE | Admitting: *Deleted

## 2012-03-21 DIAGNOSIS — Z86718 Personal history of other venous thrombosis and embolism: Secondary | ICD-10-CM

## 2012-03-21 DIAGNOSIS — I2699 Other pulmonary embolism without acute cor pulmonale: Secondary | ICD-10-CM

## 2012-03-21 DIAGNOSIS — Z8679 Personal history of other diseases of the circulatory system: Secondary | ICD-10-CM

## 2012-03-21 DIAGNOSIS — I4891 Unspecified atrial fibrillation: Secondary | ICD-10-CM

## 2012-04-08 ENCOUNTER — Other Ambulatory Visit: Payer: Self-pay | Admitting: Family Medicine

## 2012-04-10 NOTE — Telephone Encounter (Signed)
Ok to refill 

## 2012-04-10 NOTE — Telephone Encounter (Signed)
Will refill electronically  

## 2012-05-09 ENCOUNTER — Ambulatory Visit (INDEPENDENT_AMBULATORY_CARE_PROVIDER_SITE_OTHER): Payer: PRIVATE HEALTH INSURANCE | Admitting: *Deleted

## 2012-05-09 DIAGNOSIS — I2699 Other pulmonary embolism without acute cor pulmonale: Secondary | ICD-10-CM

## 2012-05-09 DIAGNOSIS — I4891 Unspecified atrial fibrillation: Secondary | ICD-10-CM

## 2012-05-09 DIAGNOSIS — Z8679 Personal history of other diseases of the circulatory system: Secondary | ICD-10-CM

## 2012-05-09 DIAGNOSIS — Z86718 Personal history of other venous thrombosis and embolism: Secondary | ICD-10-CM

## 2012-05-09 LAB — POCT INR: INR: 2

## 2012-05-19 ENCOUNTER — Other Ambulatory Visit: Payer: Self-pay | Admitting: Family Medicine

## 2012-05-20 ENCOUNTER — Telehealth: Payer: Self-pay | Admitting: Family Medicine

## 2012-05-20 DIAGNOSIS — E7800 Pure hypercholesterolemia, unspecified: Secondary | ICD-10-CM

## 2012-05-20 DIAGNOSIS — E119 Type 2 diabetes mellitus without complications: Secondary | ICD-10-CM

## 2012-05-20 DIAGNOSIS — E039 Hypothyroidism, unspecified: Secondary | ICD-10-CM

## 2012-05-20 DIAGNOSIS — Z0000 Encounter for general adult medical examination without abnormal findings: Secondary | ICD-10-CM

## 2012-05-20 DIAGNOSIS — E559 Vitamin D deficiency, unspecified: Secondary | ICD-10-CM

## 2012-05-20 NOTE — Telephone Encounter (Signed)
Message copied by Judy Pimple on Tue May 20, 2012  8:59 PM ------      Message from: Alvina Chou      Created: Thu May 15, 2012  4:49 PM      Regarding: lab orders for Wed, 8.21.13       Patient is scheduled for CPX labs, please order future labs, Thanks , Camelia Eng

## 2012-05-21 ENCOUNTER — Other Ambulatory Visit (INDEPENDENT_AMBULATORY_CARE_PROVIDER_SITE_OTHER): Payer: PRIVATE HEALTH INSURANCE

## 2012-05-21 DIAGNOSIS — Z0000 Encounter for general adult medical examination without abnormal findings: Secondary | ICD-10-CM

## 2012-05-21 DIAGNOSIS — E7800 Pure hypercholesterolemia, unspecified: Secondary | ICD-10-CM

## 2012-05-21 DIAGNOSIS — E039 Hypothyroidism, unspecified: Secondary | ICD-10-CM

## 2012-05-21 DIAGNOSIS — E119 Type 2 diabetes mellitus without complications: Secondary | ICD-10-CM

## 2012-05-21 DIAGNOSIS — E559 Vitamin D deficiency, unspecified: Secondary | ICD-10-CM

## 2012-05-21 LAB — COMPREHENSIVE METABOLIC PANEL
ALT: 25 U/L (ref 0–35)
Albumin: 3.7 g/dL (ref 3.5–5.2)
CO2: 28 mEq/L (ref 19–32)
Calcium: 10.9 mg/dL — ABNORMAL HIGH (ref 8.4–10.5)
Chloride: 104 mEq/L (ref 96–112)
GFR: 98.08 mL/min (ref >60.00)
Potassium: 3.6 mEq/L (ref 3.5–5.1)
Sodium: 138 mEq/L (ref 135–145)
Total Protein: 6.7 g/dL (ref 6.0–8.3)

## 2012-05-21 LAB — LIPID PANEL
Cholesterol: 140 mg/dL (ref 0–200)
LDL Cholesterol: 76 mg/dL (ref 0–99)
VLDL: 26.6 mg/dL (ref 0.0–40.0)

## 2012-05-21 LAB — CBC WITH DIFFERENTIAL/PLATELET
Basophils Absolute: 0 10*3/uL (ref 0.0–0.1)
Lymphocytes Relative: 23.6 % (ref 12.0–46.0)
Monocytes Relative: 7.5 % (ref 3.0–12.0)
Platelets: 207 10*3/uL (ref 150.0–400.0)
RDW: 13.6 % (ref 11.5–14.6)
WBC: 4.4 10*3/uL — ABNORMAL LOW (ref 4.5–10.5)

## 2012-05-26 ENCOUNTER — Other Ambulatory Visit: Payer: Self-pay | Admitting: Family Medicine

## 2012-05-26 NOTE — Telephone Encounter (Signed)
Go ahead and fill, with 5 refils thanks

## 2012-05-26 NOTE — Telephone Encounter (Signed)
Request for Bactroban 2%. Ok to refill?

## 2012-05-28 ENCOUNTER — Ambulatory Visit (INDEPENDENT_AMBULATORY_CARE_PROVIDER_SITE_OTHER): Payer: PRIVATE HEALTH INSURANCE | Admitting: Family Medicine

## 2012-05-28 ENCOUNTER — Encounter: Payer: Self-pay | Admitting: Family Medicine

## 2012-05-28 VITALS — BP 120/68 | HR 72 | Temp 97.8°F | Ht 66.0 in

## 2012-05-28 DIAGNOSIS — E669 Obesity, unspecified: Secondary | ICD-10-CM

## 2012-05-28 DIAGNOSIS — J0100 Acute maxillary sinusitis, unspecified: Secondary | ICD-10-CM

## 2012-05-28 DIAGNOSIS — E7800 Pure hypercholesterolemia, unspecified: Secondary | ICD-10-CM

## 2012-05-28 DIAGNOSIS — E213 Hyperparathyroidism, unspecified: Secondary | ICD-10-CM

## 2012-05-28 DIAGNOSIS — E039 Hypothyroidism, unspecified: Secondary | ICD-10-CM

## 2012-05-28 DIAGNOSIS — E119 Type 2 diabetes mellitus without complications: Secondary | ICD-10-CM

## 2012-05-28 MED ORDER — GLUCOSE BLOOD VI STRP: ORAL_STRIP | Status: DC

## 2012-05-28 MED ORDER — AMOXICILLIN-POT CLAVULANATE 875-125 MG PO TABS: 1.0000 | ORAL_TABLET | Freq: Two times a day (BID) | ORAL | Status: AC

## 2012-05-28 MED ORDER — FLUCONAZOLE 150 MG PO TABS: 150.0000 mg | ORAL_TABLET | Freq: Once | ORAL | Status: AC

## 2012-05-28 NOTE — Assessment & Plan Note (Signed)
Hypothyroidism  Pt has no clinical changes No change in energy level/ hair or skin/ edema and no tremor Lab Results  Component Value Date   TSH 4.90 05/21/2012

## 2012-05-28 NOTE — Assessment & Plan Note (Signed)
Will tx with augmentin Disc symptomatic care - see instructions on AVS  Update if not starting to improve in a week or if worsening   No increase in reactive airway with this but will let me know if that changes

## 2012-05-28 NOTE — Assessment & Plan Note (Signed)
Ca level down No symptoms Pt declines ref to endo for financial reasons at this time

## 2012-05-28 NOTE — Assessment & Plan Note (Signed)
Very well controlled with current medicines Rev low glycemic diet  Wt loss is important - a challenge F/u 6 mo  Will get a flu shot next mo

## 2012-05-28 NOTE — Assessment & Plan Note (Signed)
Discussed how this problem influences overall health and the risks it imposes  Reviewed plan for weight loss with lower calorie diet (via better food choices and also portion control or program like weight watchers) and exercise building up to or more than 30 minutes 5 days per week including some aerobic activity   Pt is morbidly obese with many barriers to exercise- but working on diet

## 2012-05-28 NOTE — Progress Notes (Signed)
Subjective:    Patient ID: Amy Bryant, female    DOB: 1955-10-27, 56 y.o.   MRN: [id number]  HPI Here for f/u of chronic conditions - pt declines health mt exam  Thinks she is getting a sinus infection Pain in face Teeth hurt-only on the left  Blowing green mucous  Cough  Ears hurt Scratchy throat Sick since last weekend-worse this weekend   Lots of sick people at work     Did not weigh today Morbidly obese- which is taking its toll on her - joint pain / mobility issues/unable to exercise   Diabetes Home sugar results  DM diet - is fair - still too many carbs/ really likes corn  Wt at home is the same  Exercise -- unable , but does work every day  Symptoms A1C last  Lab Results  Component Value Date   HGBA1C 6.2 05/21/2012  this is down from 6.4  No problems with medications-- glucophage and glucotrol  Last eye exam - still cannot afford (but getting a new insurance -- ? Who knows if it would improve) - has big deductible     Hyperlipidemia On zocor Lab Results  Component Value Date   CHOL 140 05/21/2012   CHOL 151 11/22/2011   CHOL 162 02/08/2011   Lab Results  Component Value Date   HDL 37.50* 05/21/2012   HDL 36.60* 11/22/2011   HDL 35.20* 02/08/2011   Lab Results  Component Value Date   LDLCALC 76 05/21/2012   LDLCALC 88 02/08/2011   LDLCALC 90 11/02/2010   Lab Results  Component Value Date   TRIG 133.0 05/21/2012   TRIG 218.0* 11/22/2011   TRIG 192.0* 02/08/2011   Lab Results  Component Value Date   CHOLHDL 4 05/21/2012   CHOLHDL 4 11/22/2011   CHOLHDL 5 02/08/2011   Lab Results  Component Value Date   LDLDIRECT 95.7 11/22/2011   LDLDIRECT 157.6 08/16/2009   looking better  No fried or greasy foods   Hyperparathyroidism Pt cannot afford further eval or specialist consult for this Ca level is improved at 10.9- reassuring   Lab Results  Component Value Date   TSH 4.90 05/21/2012   she does have hot flashes  bp good 120/68  Vit D is 34- also  improved - has inc her dose   Patient Active Problem List  Diagnosis  . HYPOTHYROIDISM  . DIABETES MELLITUS, MILD  . HYPERPARATHYROIDISM UNSPECIFIED  . PURE HYPERCHOLESTEROLEMIA  . OBESITY  . OBESITY HYPOVENTILATION SYNDROME  . COR PULMONALE  . REACTIVE AIRWAY DISEASE  . GERD  . INTERTRIGO  . DISC DISEASE, LUMBAR  . PULMONARY EMBOLISM, HX OF  . ATRIAL FIBRILLATION, HX OF  . DYSURIA, HX OF  . TOBACCO USE, QUIT  . UNSPECIFIED VITAMIN D DEFICIENCY  . Atrial fibrillation  . Pulmonary embolus  . Cough  . Routine general medical examination at a health care facility   Past Medical History  Diagnosis Date  . GERD (gastroesophageal reflux disease)   . Pulmonary embolism     hx of on coumadin  . Morbid obesity   . Reactive airways dysfunction syndrome   . Cor pulmonale     and obesity hypoventilation syndrome  . Hypothyroid   . OA (osteoarthritis)   . Atrial fibrillation     with cardioversion success   Past Surgical History  Procedure Date  . Tonsillectomy    History  Substance Use Topics  . Smoking status: Former Games developer  . Smokeless tobacco:  Not on file  . Alcohol Use: No   Family History  Problem Relation Age of Onset  . Heart disease Father 61    MI  . Diabetes Brother    Allergies  Allergen Reactions  . Pseudoephedrine     REACTION: tightness in chest   Current Outpatient Prescriptions on File Prior to Visit  Medication Sig Dispense Refill  . albuterol (PROVENTIL) (2.5 MG/3ML) 0.083% nebulizer solution Take 3 mLs (2.5 mg total) by nebulization every 4 (four) hours as needed for wheezing.  150 mL  1  . cholecalciferol (VITAMIN D) 1000 UNITS tablet Take 4,000 Units by mouth daily.      . ciclopirox (LOPROX) 0.77 % cream APPLY TO AFFECTED AREA TWICE A DAY AS NEEDED  90 g  2  . diclofenac (VOLTAREN) 75 MG EC tablet Take 1 tablet (75 mg total) by mouth 2 (two) times daily. with food as needed for pain.  60 tablet  11  . diltiazem (TIAZAC) 120 MG 24 hr capsule  1 BY MOUTH ONCE DAILY  30 capsule  11  . fluticasone (FLONASE) 50 MCG/ACT nasal spray Place 2 sprays into the nose daily.  16 g  11  . Fluticasone-Salmeterol (ADVAIR DISKUS) 250-50 MCG/DOSE AEPB Inhale 1 puff into the lungs every 12 (twelve) hours.  1 each  11  . furosemide (LASIX) 80 MG tablet TAKE 1 TABLET BY MOUTH TWO TIMES A DAY  60 tablet  11  . glipiZIDE (GLUCOTROL XL) 5 MG 24 hr tablet TAKE 1 TABLET BY MOUTH DAILY  30 tablet  3  . glucose blood test strip Test blood sugar once daily and as needed for DM 250.0       . KLOR-CON M20 20 MEQ tablet TAKE 2 TABS BY MOUTH ONCE DAILY  60 tablet  11  . Lancets (ONETOUCH ULTRASOFT) lancets Test blood sugar once daily and as needed for DM 250.0       . levothyroxine (SYNTHROID, LEVOTHROID) 137 MCG tablet TAKE ONE TABLET BY MOUTH EVERY DAY  30 tablet  11  . loratadine (CLARITIN) 10 MG tablet OTC as directed.       . metFORMIN (GLUCOPHAGE) 1000 MG tablet Take 1 tablet (1,000 mg total) by mouth 2 (two) times daily with a meal.  60 tablet  11  . mometasone (ELOCON) 0.1 % cream Tiny amount to each ear canal twice weekly as needed.      . mupirocin ointment (BACTROBAN) 2 % APPLY TO AFFECTED AREA TWICE A DAY  22 g  5  . omeprazole (PRILOSEC) 20 MG capsule Take 20 mg by mouth 2 (two) times daily.       . simvastatin (ZOCOR) 20 MG tablet Take 1 tablet (20 mg total) by mouth at bedtime.  30 tablet  11  . warfarin (COUMADIN) 5 MG tablet Take as directed by the Anticoagulation Clinic  40 tablet  3  . Multiple Vitamin (MULTIVITAMIN) capsule Take 1 capsule by mouth daily.            Review of Systems Review of Systems  Constitutional: Negative for fever, appetite change,  and unexpected weight change.  ENT pos for congestion/ sinus pain/ st/ drip Eyes: Negative for pain and visual disturbance.  Respiratory: Negative for wheeze today, pos for cough.   Cardiovascular: Negative for cp or palpitations    Gastrointestinal: Negative for nausea, diarrhea and  constipation.  Genitourinary: Negative for urgency and frequency.  Skin: Negative for pallor or rash   MSK pos  for joint pain/ mobility issues  Neurological: Negative for weakness, light-headedness, numbness and headaches.  Hematological: Negative for adenopathy. Does not bruise/bleed easily.  Psychiatric/Behavioral: Negative for dysphoric mood. The patient is generally anxious        Objective:   Physical Exam  Constitutional: She appears well-developed and well-nourished. No distress.       Morbidly obese- in wheelchair today   HENT:  Head: Normocephalic and atraumatic.  Right Ear: External ear normal.  Left Ear: External ear normal.  Mouth/Throat: Oropharynx is clear and moist. No oropharyngeal exudate.       Nares are injected and congested   Bilateral maxillary sinus tenderness  Eyes: Conjunctivae and EOM are normal. Pupils are equal, round, and reactive to light. Right eye exhibits no discharge. Left eye exhibits no discharge. No scleral icterus.  Neck: Normal range of motion. Neck supple. No JVD present. Carotid bruit is not present. No thyromegaly present.  Cardiovascular: Normal rate, regular rhythm, normal heart sounds and intact distal pulses.  Exam reveals no gallop.   Pulmonary/Chest: Effort normal and breath sounds normal. No respiratory distress. She has no wheezes. She has no rales.       Diffusely distant bs   Abdominal: Soft. Bowel sounds are normal. She exhibits no distension, no abdominal bruit and no mass. There is no tenderness.  Musculoskeletal: She exhibits edema and tenderness.       Knees are diffusely tender- limited rom and gait due to obesity as well  Lymphadenopathy:    She has no cervical adenopathy.  Neurological: She is alert. She has normal reflexes. No cranial nerve deficit. She exhibits normal muscle tone. Coordination normal.  Skin: Skin is warm and dry. No rash noted. No erythema. No pallor.  Psychiatric: She has a normal mood and affect.           Assessment & Plan:

## 2012-05-28 NOTE — Patient Instructions (Addendum)
For the sinus infection - drink lots of water and use nasal saline spray  Also take the augmentin as directed Diflucan if you get a yeast infection Let the coumadin clinic know you are on antibiotic Labs are stable  Follow up in 6 months with labs prior

## 2012-05-28 NOTE — Assessment & Plan Note (Signed)
Improved with better diet and zocor Disc goals for lipids and reasons to control them Rev labs with pt Rev low sat fat diet in detail

## 2012-06-09 ENCOUNTER — Telehealth: Payer: Self-pay | Admitting: *Deleted

## 2012-06-09 NOTE — Telephone Encounter (Signed)
Received faxed form from [facility] to br completed and faxed back for diabetic supplies. Spoke to patient and was advised that she does want to get her supplies from [facility].  Form is in your in box.

## 2012-06-09 NOTE — Telephone Encounter (Signed)
Done and in IN box thanks 

## 2012-06-10 NOTE — Telephone Encounter (Signed)
Form faxed to [facility]  [phone]

## 2012-06-20 ENCOUNTER — Ambulatory Visit (INDEPENDENT_AMBULATORY_CARE_PROVIDER_SITE_OTHER): Payer: PRIVATE HEALTH INSURANCE | Admitting: *Deleted

## 2012-06-20 DIAGNOSIS — I4891 Unspecified atrial fibrillation: Secondary | ICD-10-CM

## 2012-06-20 DIAGNOSIS — I2699 Other pulmonary embolism without acute cor pulmonale: Secondary | ICD-10-CM

## 2012-06-20 DIAGNOSIS — Z86718 Personal history of other venous thrombosis and embolism: Secondary | ICD-10-CM

## 2012-06-20 DIAGNOSIS — Z8679 Personal history of other diseases of the circulatory system: Secondary | ICD-10-CM

## 2012-07-16 ENCOUNTER — Other Ambulatory Visit: Payer: Self-pay | Admitting: Family Medicine

## 2012-07-23 ENCOUNTER — Other Ambulatory Visit: Payer: Self-pay | Admitting: Family Medicine

## 2012-07-28 ENCOUNTER — Ambulatory Visit (INDEPENDENT_AMBULATORY_CARE_PROVIDER_SITE_OTHER): Payer: PRIVATE HEALTH INSURANCE

## 2012-07-28 DIAGNOSIS — Z8679 Personal history of other diseases of the circulatory system: Secondary | ICD-10-CM

## 2012-07-28 DIAGNOSIS — I2699 Other pulmonary embolism without acute cor pulmonale: Secondary | ICD-10-CM

## 2012-07-28 DIAGNOSIS — Z86718 Personal history of other venous thrombosis and embolism: Secondary | ICD-10-CM

## 2012-07-28 DIAGNOSIS — I4891 Unspecified atrial fibrillation: Secondary | ICD-10-CM

## 2012-09-05 ENCOUNTER — Ambulatory Visit (INDEPENDENT_AMBULATORY_CARE_PROVIDER_SITE_OTHER): Payer: BC Managed Care – PPO | Admitting: *Deleted

## 2012-09-05 DIAGNOSIS — Z86718 Personal history of other venous thrombosis and embolism: Secondary | ICD-10-CM

## 2012-09-05 DIAGNOSIS — I4891 Unspecified atrial fibrillation: Secondary | ICD-10-CM

## 2012-09-05 DIAGNOSIS — Z8679 Personal history of other diseases of the circulatory system: Secondary | ICD-10-CM

## 2012-09-05 DIAGNOSIS — I2699 Other pulmonary embolism without acute cor pulmonale: Secondary | ICD-10-CM

## 2012-09-05 LAB — POCT INR: INR: 2.6

## 2012-09-05 MED ORDER — WARFARIN SODIUM 5 MG PO TABS: ORAL_TABLET | ORAL | Status: DC

## 2012-09-21 ENCOUNTER — Other Ambulatory Visit: Payer: Self-pay | Admitting: Family Medicine

## 2012-09-24 ENCOUNTER — Other Ambulatory Visit: Payer: Self-pay | Admitting: Family Medicine

## 2012-09-25 ENCOUNTER — Other Ambulatory Visit: Payer: Self-pay | Admitting: *Deleted

## 2012-09-25 MED ORDER — FUROSEMIDE 80 MG PO TABS: ORAL_TABLET | ORAL | Status: DC

## 2012-10-21 ENCOUNTER — Other Ambulatory Visit: Payer: Self-pay | Admitting: Family Medicine

## 2012-10-23 NOTE — Telephone Encounter (Signed)
Pt request refill klor-con to [facility] # [phone] x 6. Pt advised  done.

## 2012-10-24 ENCOUNTER — Ambulatory Visit (INDEPENDENT_AMBULATORY_CARE_PROVIDER_SITE_OTHER): Payer: BC Managed Care – PPO

## 2012-10-24 DIAGNOSIS — Z8679 Personal history of other diseases of the circulatory system: Secondary | ICD-10-CM

## 2012-10-24 DIAGNOSIS — Z86718 Personal history of other venous thrombosis and embolism: Secondary | ICD-10-CM

## 2012-10-24 DIAGNOSIS — I4891 Unspecified atrial fibrillation: Secondary | ICD-10-CM

## 2012-10-24 DIAGNOSIS — I2699 Other pulmonary embolism without acute cor pulmonale: Secondary | ICD-10-CM

## 2012-11-05 ENCOUNTER — Other Ambulatory Visit: Payer: Self-pay | Admitting: Family Medicine

## 2012-11-18 ENCOUNTER — Other Ambulatory Visit: Payer: Self-pay | Admitting: Family Medicine

## 2012-11-19 ENCOUNTER — Other Ambulatory Visit: Payer: Self-pay | Admitting: Family Medicine

## 2012-11-20 ENCOUNTER — Telehealth: Payer: Self-pay | Admitting: Family Medicine

## 2012-11-20 NOTE — Telephone Encounter (Signed)
Message copied by Judy Pimple on Thu Nov 20, 2012  6:44 PM ------      Message from: Alvina Chou      Created: Tue Nov 11, 2012  4:48 PM      Regarding: Lab orders for Friday, 2.21.14       Patient is scheduled for CPX labs, please order future labs, Thanks , Terri       ------

## 2012-11-21 ENCOUNTER — Other Ambulatory Visit: Payer: PRIVATE HEALTH INSURANCE

## 2012-11-23 ENCOUNTER — Emergency Department (HOSPITAL_COMMUNITY)
Admission: EM | Admit: 2012-11-23 | Discharge: 2012-11-23 | Disposition: A | Discharge status: Home / Self Care | Payer: BC Managed Care – PPO | Source: Home / Self Care

## 2012-11-23 ENCOUNTER — Encounter (HOSPITAL_COMMUNITY): Payer: Self-pay | Admitting: *Deleted

## 2012-11-23 DIAGNOSIS — S058X9A Other injuries of unspecified eye and orbit, initial encounter: Secondary | ICD-10-CM

## 2012-11-23 MED ORDER — TOBRAMYCIN 0.3 % OP OINT
TOPICAL_OINTMENT | Freq: Three times a day (TID) | OPHTHALMIC | Status: DC
  Administered 2012-11-23: 16:00:00 via OPHTHALMIC

## 2012-11-23 MED ORDER — TOBRAMYCIN 0.3 % OP OINT
TOPICAL_OINTMENT | OPHTHALMIC | Status: AC
  Filled 2012-11-23: qty 3.5

## 2012-11-23 MED ORDER — TETRACAINE HCL 0.5 % OP SOLN
OPHTHALMIC | Status: AC
  Filled 2012-11-23: qty 2

## 2012-11-23 MED ORDER — HYDROCODONE-ACETAMINOPHEN 5-500 MG PO TABS: 1.0000 | ORAL_TABLET | Freq: Four times a day (QID) | ORAL | Status: DC | PRN

## 2012-11-23 NOTE — ED Provider Notes (Signed)
History     CSN: [id number]  Arrival date & time 11/23/12  1411   First MD Initiated Contact with Patient 11/23/12 1521      Chief Complaint  Patient presents with  . Eye Problem    (Consider location/radiation/quality/duration/timing/severity/associated sxs/prior treatment) Patient is a 57 y.o. female presenting with eye problem.  Eye Problem Associated symptoms: discharge, photophobia and redness    This is a 57 year old female who presents with severe pain in the left eye along with redness and clear watery discharge which started this morning soon after she removed her CPAP mask. The patient does not recall injuring the high. She is unable to see properly through the eye. She is unable to open her eye especially in the light. Touching the eye hurts exquisitely. She's had constant tearing. Past Medical History  Diagnosis Date  . GERD (gastroesophageal reflux disease)   . Pulmonary embolism     hx of on coumadin  . Morbid obesity   . Reactive airways dysfunction syndrome   . Cor pulmonale     and obesity hypoventilation syndrome  . Hypothyroid   . OA (osteoarthritis)   . Atrial fibrillation     with cardioversion success    Past Surgical History  Procedure Laterality Date  . Tonsillectomy      Family History  Problem Relation Age of Onset  . Heart disease Father 44    MI  . Diabetes Brother     History  Substance Use Topics  . Smoking status: Former Games developer  . Smokeless tobacco: Not on file  . Alcohol Use: No    OB History   Grav Para Term Preterm Abortions TAB SAB Ect Mult Living                  Review of Systems  Constitutional: Negative.   HENT: Negative.   Eyes: Positive for photophobia, pain, discharge, redness and visual disturbance.  Respiratory: Negative.   Cardiovascular: Negative.   Gastrointestinal: Negative.   Genitourinary: Negative.   Musculoskeletal: Negative.   Skin: Negative.   Neurological: Negative.   Hematological: Negative.    Psychiatric/Behavioral: Negative.     Allergies  Pseudoephedrine  Home Medications   Current Outpatient Rx  Name  Route  Sig  Dispense  Refill  . albuterol (PROVENTIL) (2.5 MG/3ML) 0.083% nebulizer solution      INHALE 1 VAIL VIA NEBULIZER EVERY 4 HOURS AS NEEDED FOR WHEEZE   150 mL   1   . cholecalciferol (VITAMIN D) 1000 UNITS tablet   Oral   Take 4,000 Units by mouth daily.         . ciclopirox (LOPROX) 0.77 % cream      APPLY TO AFFECTED AREA TWICE A DAY AS NEEDED   90 g   2   . diclofenac (VOLTAREN) 75 MG EC tablet   Oral   Take 1 tablet (75 mg total) by mouth 2 (two) times daily. with food as needed for pain.   60 tablet   11   . diltiazem (TIAZAC) 120 MG 24 hr capsule      TAKE 1 TABLET BY MOUTH DAILY   30 capsule   0   . fluticasone (FLONASE) 50 MCG/ACT nasal spray      INHALE TWO SPRAY IN EACH NOSTRIL EVERY DAY   16 g   0   . Fluticasone-Salmeterol (ADVAIR DISKUS) 250-50 MCG/DOSE AEPB   Inhalation   Inhale 1 puff into the lungs every 12 (twelve) hours.  1 each   11   . furosemide (LASIX) 80 MG tablet      TAKE 1 TABLET BY MOUTH TWO TIMES A DAY   60 tablet   5   . glipiZIDE (GLUCOTROL XL) 5 MG 24 hr tablet      TAKE 1 TABLET BY MOUTH DAILY   30 tablet   1   . glucose blood test strip      Test blood sugar once daily and as needed for DM 250.0   100 each   3   . HYDROcodone-acetaminophen (VICODIN) 5-500 MG per tablet   Oral   Take 1-2 tablets by mouth every 6 (six) hours as needed for pain.   30 tablet   0   . KLOR-CON M20 20 MEQ tablet      TAKE 2 TABS BY MOUTH ONCE DAILY   60 tablet   6   . Lancets (ONETOUCH ULTRASOFT) lancets      Test blood sugar once daily and as needed for DM 250.0          . levothyroxine (SYNTHROID, LEVOTHROID) 137 MCG tablet      TAKE ONE TABLET BY MOUTH EVERY DAY   30 tablet   4   . loratadine (CLARITIN) 10 MG tablet      OTC as directed.          . metFORMIN (GLUCOPHAGE) 1000 MG  tablet      TAKE 1 TABLET TWICE A DAY WITH A MEAL   60 tablet   0   . mometasone (ELOCON) 0.1 % cream      Tiny amount to each ear canal twice weekly as needed.         . Multiple Vitamin (MULTIVITAMIN) capsule   Oral   Take 1 capsule by mouth daily.           . mupirocin ointment (BACTROBAN) 2 %      APPLY TO AFFECTED AREA TWICE A DAY   22 g   5   . omeprazole (PRILOSEC) 20 MG capsule   Oral   Take 20 mg by mouth 2 (two) times daily.          . simvastatin (ZOCOR) 20 MG tablet      TAKE 1 TABLET AT BEDTIME   30 tablet   0   . warfarin (COUMADIN) 5 MG tablet      Take as directed by the Anticoagulation Clinic   40 tablet   3     Pulse 70  Temp(Src) 97.9 F (36.6 C) (Oral)  Resp 19  SpO2 97%  Physical Exam  Constitutional: She is oriented to person, place, and time. She appears well-developed and well-nourished.  HENT:  Head: Normocephalic and atraumatic.  Eyes:  Left eye severely hyperemic with tenderness to touch. Patient unable to open eyelid and will not allow me to do it due to pain. Significant amount of tearing appear Fluorescin  exam reveals large corneal ulcer in the area of the pupil.  Cardiovascular: Normal rate and regular rhythm.   Pulmonary/Chest: Effort normal and breath sounds normal.  Abdominal: Soft. Bowel sounds are normal.  Musculoskeletal: Normal range of motion.  Neurological: She is alert and oriented to person, place, and time.  Skin: Skin is warm and dry.  Psychiatric:  Anxious    ED Course  Procedures (including critical care time)  Labs Reviewed - No data to display No results found.   1. Corneal abrasion, left, initial encounter  MDM  Tobrex ointment and followup with ophthalmology.        Calvert Cantor, MD 11/23/12 [phone]

## 2012-11-23 NOTE — ED Notes (Signed)
pT  HAS  PAINFULL  WATERY  L  EYE  NOTICED  THIS  AM   WHEN  SHE  TOOK  HER  cpap  MACHINE  OFF         DENYS  ANY  KNOWN  SPECEFIC  INJURY

## 2012-11-28 ENCOUNTER — Ambulatory Visit: Payer: PRIVATE HEALTH INSURANCE | Admitting: Family Medicine

## 2012-11-28 ENCOUNTER — Other Ambulatory Visit (INDEPENDENT_AMBULATORY_CARE_PROVIDER_SITE_OTHER): Payer: BC Managed Care – PPO

## 2012-11-28 DIAGNOSIS — E213 Hyperparathyroidism, unspecified: Secondary | ICD-10-CM

## 2012-11-28 DIAGNOSIS — Z0000 Encounter for general adult medical examination without abnormal findings: Secondary | ICD-10-CM

## 2012-11-28 DIAGNOSIS — E119 Type 2 diabetes mellitus without complications: Secondary | ICD-10-CM

## 2012-11-28 DIAGNOSIS — E7800 Pure hypercholesterolemia, unspecified: Secondary | ICD-10-CM

## 2012-11-28 LAB — CBC WITH DIFFERENTIAL/PLATELET
Basophils Absolute: 0 10*3/uL (ref 0.0–0.1)
Eosinophils Absolute: 0.3 10*3/uL (ref 0.0–0.7)
Hemoglobin: 12.7 g/dL (ref 12.0–15.0)
Lymphocytes Relative: 20.9 % (ref 12.0–46.0)
MCHC: 32.8 g/dL (ref 30.0–36.0)
Monocytes Relative: 7.5 % (ref 3.0–12.0)
Neutrophils Relative %: 65.8 % (ref 43.0–77.0)
Platelets: 212 10*3/uL (ref 150.0–400.0)
RDW: 13.8 % (ref 11.5–14.6)

## 2012-11-28 LAB — COMPREHENSIVE METABOLIC PANEL
ALT: 25 U/L (ref 0–35)
AST: 20 U/L (ref 0–37)
CO2: 28 mEq/L (ref 19–32)
Calcium: 11 mg/dL — ABNORMAL HIGH (ref 8.4–10.5)
Chloride: 103 mEq/L (ref 96–112)
GFR: 84.47 mL/min (ref >60.00)
Potassium: 3.9 mEq/L (ref 3.5–5.1)
Sodium: 139 mEq/L (ref 135–145)
Total Protein: 6.7 g/dL (ref 6.0–8.3)

## 2012-11-28 LAB — LIPID PANEL
Total CHOL/HDL Ratio: 5
Triglycerides: 244 mg/dL — ABNORMAL HIGH (ref 0.0–149.0)

## 2012-11-28 LAB — TSH: TSH: 5.58 u[IU]/mL — ABNORMAL HIGH (ref 0.35–5.50)

## 2012-12-09 ENCOUNTER — Encounter: Payer: Self-pay | Admitting: Family Medicine

## 2012-12-09 ENCOUNTER — Ambulatory Visit (INDEPENDENT_AMBULATORY_CARE_PROVIDER_SITE_OTHER): Payer: BC Managed Care – PPO | Admitting: Family Medicine

## 2012-12-09 VITALS — BP 112/62 | HR 70 | Temp 98.9°F | Ht 66.0 in

## 2012-12-09 DIAGNOSIS — I4891 Unspecified atrial fibrillation: Secondary | ICD-10-CM

## 2012-12-09 DIAGNOSIS — E119 Type 2 diabetes mellitus without complications: Secondary | ICD-10-CM

## 2012-12-09 DIAGNOSIS — E039 Hypothyroidism, unspecified: Secondary | ICD-10-CM

## 2012-12-09 DIAGNOSIS — E213 Hyperparathyroidism, unspecified: Secondary | ICD-10-CM

## 2012-12-09 DIAGNOSIS — E559 Vitamin D deficiency, unspecified: Secondary | ICD-10-CM

## 2012-12-09 DIAGNOSIS — E7800 Pure hypercholesterolemia, unspecified: Secondary | ICD-10-CM

## 2012-12-09 LAB — PROTIME-INR: Prothrombin Time: 31.8 s — ABNORMAL HIGH (ref 10.2–12.4)

## 2012-12-09 MED ORDER — GLIPIZIDE ER 5 MG PO TB24: 5.0000 mg | ORAL_TABLET | Freq: Every day | ORAL | Status: DC

## 2012-12-09 MED ORDER — LEVOTHYROXINE SODIUM 150 MCG PO TABS
150.0000 ug | ORAL_TABLET | Freq: Every day | ORAL | Status: DC
Start: 2012-12-09 — End: 2013-01-20

## 2012-12-09 NOTE — Progress Notes (Signed)
Subjective:    Patient ID: Amy Bryant, female    DOB: 1956/08/10, 57 y.o.   MRN: [id number]  HPI  Here for f/u of chronic health problems   Is feeling ok overall   Unable to get on the scale to weigh- is morbidly obese  Diabetes Home sugar results - no low blood sugars , highest was 165 DM diet - not good at all , not motivated and has a lot of stress and diabetes is not a priority Exercise - walks some at work and at the house - cannot go too far  Symptoms A1C last  Lab Results  Component Value Date   HGBA1C 6.7* 11/28/2012  this is up from 6.2  No problems with medications  Renal protection Last eye exam - has not had one in the past year- but insurance finally covers it - will get an exam  Hyperlipidemia  Lab Results  Component Value Date   CHOL 143 11/28/2012   CHOL 140 05/21/2012   CHOL 151 11/22/2011   Lab Results  Component Value Date   HDL 29.00* 11/28/2012   HDL 37.50* 05/21/2012   HDL 36.60* 11/22/2011   Lab Results  Component Value Date   LDLCALC 76 05/21/2012   LDLCALC 88 02/08/2011   LDLCALC 90 11/02/2010   Lab Results  Component Value Date   TRIG 244.0* 11/28/2012   TRIG 133.0 05/21/2012   TRIG 218.0* 11/22/2011   Lab Results  Component Value Date   CHOLHDL 5 11/28/2012   CHOLHDL 4 05/21/2012   CHOLHDL 4 11/22/2011   Lab Results  Component Value Date   LDLDIRECT 90.7 11/28/2012   LDLDIRECT 95.7 11/22/2011   LDLDIRECT 157.6 08/16/2009   On statin- zocor Not as active in the winter - and plans to be more - that could dec her HDL Trig?  Something she ate before hand - had a hamburger shortly before   Hyperparathyroid Pt cannot afford specialist eval for this  Ca is at 11 (stable overall) Hx of D def also Level is 31 this draw- she takes 2000 iu per day- and will double that   Hypothyroid tsh up at 5.58 today - has not missed any doses - will go up on that  Is lethargic   Flu vaccine - did not get a flu vaccine - will get one next season   ?  If she can have protime today  Arline Asp is not here today- wonders if she could have a central draw to send to her     Patient Active Problem List  Diagnosis  . HYPOTHYROIDISM  . DIABETES MELLITUS, MILD  . HYPERPARATHYROIDISM UNSPECIFIED  . PURE HYPERCHOLESTEROLEMIA  . OBESITY  . OBESITY HYPOVENTILATION SYNDROME  . COR PULMONALE  . REACTIVE AIRWAY DISEASE  . GERD  . DISC DISEASE, LUMBAR  . PULMONARY EMBOLISM, HX OF  . ATRIAL FIBRILLATION, HX OF  . DYSURIA, HX OF  . TOBACCO USE, QUIT  . UNSPECIFIED VITAMIN D DEFICIENCY  . Atrial fibrillation  . Pulmonary embolus  . Routine general medical examination at a health care facility   Past Medical History  Diagnosis Date  . GERD (gastroesophageal reflux disease)   . Pulmonary embolism     hx of on coumadin  . Morbid obesity   . Reactive airways dysfunction syndrome   . Cor pulmonale     and obesity hypoventilation syndrome  . Hypothyroid   . OA (osteoarthritis)   . Atrial fibrillation     with  cardioversion success   Past Surgical History  Procedure Laterality Date  . Tonsillectomy     History  Substance Use Topics  . Smoking status: Former Games developer  . Smokeless tobacco: Not on file  . Alcohol Use: No   Family History  Problem Relation Age of Onset  . Heart disease Father 37    MI  . Diabetes Brother    Allergies  Allergen Reactions  . Pseudoephedrine     REACTION: tightness in chest   Current Outpatient Prescriptions on File Prior to Visit  Medication Sig Dispense Refill  . albuterol (PROVENTIL) (2.5 MG/3ML) 0.083% nebulizer solution INHALE 1 VAIL VIA NEBULIZER EVERY 4 HOURS AS NEEDED FOR WHEEZE  150 mL  1  . cholecalciferol (VITAMIN D) 1000 UNITS tablet Take 4,000 Units by mouth daily.      . ciclopirox (LOPROX) 0.77 % cream APPLY TO AFFECTED AREA TWICE A DAY AS NEEDED  90 g  2  . diclofenac (VOLTAREN) 75 MG EC tablet Take 1 tablet (75 mg total) by mouth 2 (two) times daily. with food as needed for pain.  60  tablet  11  . diltiazem (TIAZAC) 120 MG 24 hr capsule TAKE 1 TABLET BY MOUTH DAILY  30 capsule  0  . fluticasone (FLONASE) 50 MCG/ACT nasal spray INHALE TWO SPRAY IN EACH NOSTRIL EVERY DAY  16 g  0  . Fluticasone-Salmeterol (ADVAIR DISKUS) 250-50 MCG/DOSE AEPB Inhale 1 puff into the lungs every 12 (twelve) hours.  1 each  11  . furosemide (LASIX) 80 MG tablet TAKE 1 TABLET BY MOUTH TWO TIMES A DAY  60 tablet  5  . glucose blood test strip Test blood sugar once daily and as needed for DM 250.0  100 each  3  . HYDROcodone-acetaminophen (VICODIN) 5-500 MG per tablet Take 1-2 tablets by mouth every 6 (six) hours as needed for pain.  30 tablet  0  . KLOR-CON M20 20 MEQ tablet TAKE 2 TABS BY MOUTH ONCE DAILY  60 tablet  6  . Lancets (ONETOUCH ULTRASOFT) lancets Test blood sugar once daily and as needed for DM 250.0       . loratadine (CLARITIN) 10 MG tablet OTC as directed.       . metFORMIN (GLUCOPHAGE) 1000 MG tablet TAKE 1 TABLET TWICE A DAY WITH A MEAL  60 tablet  0  . mometasone (ELOCON) 0.1 % cream Tiny amount to each ear canal twice weekly as needed.      . Multiple Vitamin (MULTIVITAMIN) capsule Take 1 capsule by mouth daily.        . mupirocin ointment (BACTROBAN) 2 % APPLY TO AFFECTED AREA TWICE A DAY  22 g  5  . omeprazole (PRILOSEC) 20 MG capsule Take 20 mg by mouth 2 (two) times daily.       . simvastatin (ZOCOR) 20 MG tablet TAKE 1 TABLET AT BEDTIME  30 tablet  0  . warfarin (COUMADIN) 5 MG tablet Take as directed by the Anticoagulation Clinic  40 tablet  3   No current facility-administered medications on file prior to visit.    Review of Systems Review of Systems  Constitutional: Negative for fever, appetite change,  and unexpected weight change. pos for chronic fatigue Eyes: Negative for pain and visual disturbance.  Respiratory: Negative for cough and pos for baseline sob   Cardiovascular: Negative for cp or palpitations    Gastrointestinal: Negative for nausea, diarrhea and  constipation.  Genitourinary: Negative for urgency and frequency.  Skin: Negative for pallor or rash   MSK pos for baseline joint and back pain , neg for muscle spasm Neurological: Negative for weakness, light-headedness, numbness and headaches.  Hematological: Negative for adenopathy. Does not bruise/bleed easily.  Psychiatric/Behavioral: Negative for dysphoric mood. The patient is not nervous/anxious.         Objective:   Physical Exam  Constitutional: She appears well-developed and well-nourished. No distress.  Morbidly obese- in wheelchair  HENT:  Head: Normocephalic and atraumatic.  Mouth/Throat: Oropharynx is clear and moist.  Eyes: Conjunctivae and EOM are normal. Pupils are equal, round, and reactive to light. Right eye exhibits no discharge. Left eye exhibits no discharge. No scleral icterus.  Neck: Normal range of motion. Neck supple. No JVD present. Carotid bruit is not present. No thyromegaly present.  Cardiovascular: Normal rate and regular rhythm.   Pulmonary/Chest: Effort normal and breath sounds normal. No respiratory distress. She has no wheezes.  Abdominal: Soft. Bowel sounds are normal. She exhibits no distension, no abdominal bruit and no mass. There is no tenderness.  Musculoskeletal: She exhibits edema. She exhibits no tenderness.  Trace ankle edema   Lymphadenopathy:    She has no cervical adenopathy.  Neurological: She is alert. She has normal reflexes. She displays no tremor. No cranial nerve deficit. She exhibits normal muscle tone. Coordination normal.  Skin: Skin is warm and dry. No rash noted. No erythema. No pallor.  Psychiatric: She has a normal mood and affect.          Assessment & Plan:

## 2012-12-09 NOTE — Assessment & Plan Note (Signed)
Trig up and HDL down on statin Disc diet/ exercise- this is not a priority for pt right now

## 2012-12-09 NOTE — Patient Instructions (Addendum)
Schedule non fasting lab 6 weeks for thyroid  Increase dose to 150 mcg Increase your vitamin D to 4000 iu daily INR today  Don't forget to get your eye exam for diabetes Follow up with me in 6 months for annual exam with labs prior

## 2012-12-09 NOTE — Assessment & Plan Note (Signed)
Will inc dose to 4000 iu daily

## 2012-12-09 NOTE — Assessment & Plan Note (Signed)
Stable with ca level of 11, no symptoms Pt cannot afford eval at this time  Will keep working on vit D suppl

## 2012-12-09 NOTE — Assessment & Plan Note (Signed)
DM is fairly stable  Lab Results  Component Value Date   HGBA1C 6.7* 11/28/2012    Pt will not watch diet for this or wt loss due to the amt of stress she is under- she is honest about that  Will continue to monitor F/u 6 mo She will schedule eye exam

## 2012-12-09 NOTE — Assessment & Plan Note (Signed)
tsh is up a bit with no missed doses C/o fatigue/ lethargy Inc to 150 mcg daily Re check tsh 6 wk

## 2012-12-09 NOTE — Assessment & Plan Note (Signed)
Will draw INR and send to [facility] for comment

## 2012-12-10 ENCOUNTER — Ambulatory Visit (INDEPENDENT_AMBULATORY_CARE_PROVIDER_SITE_OTHER): Payer: BC Managed Care – PPO | Admitting: General Practice

## 2012-12-10 DIAGNOSIS — I4891 Unspecified atrial fibrillation: Secondary | ICD-10-CM

## 2012-12-10 DIAGNOSIS — Z86718 Personal history of other venous thrombosis and embolism: Secondary | ICD-10-CM

## 2012-12-10 DIAGNOSIS — I2699 Other pulmonary embolism without acute cor pulmonale: Secondary | ICD-10-CM

## 2012-12-10 DIAGNOSIS — Z8679 Personal history of other diseases of the circulatory system: Secondary | ICD-10-CM

## 2012-12-14 ENCOUNTER — Other Ambulatory Visit: Payer: Self-pay | Admitting: Family Medicine

## 2012-12-15 ENCOUNTER — Other Ambulatory Visit: Payer: Self-pay | Admitting: Family Medicine

## 2012-12-29 ENCOUNTER — Other Ambulatory Visit: Payer: Self-pay | Admitting: Family Medicine

## 2013-01-19 ENCOUNTER — Ambulatory Visit (INDEPENDENT_AMBULATORY_CARE_PROVIDER_SITE_OTHER): Payer: BC Managed Care – PPO | Admitting: General Practice

## 2013-01-19 ENCOUNTER — Other Ambulatory Visit (INDEPENDENT_AMBULATORY_CARE_PROVIDER_SITE_OTHER): Payer: BC Managed Care – PPO

## 2013-01-19 DIAGNOSIS — I4891 Unspecified atrial fibrillation: Secondary | ICD-10-CM

## 2013-01-19 DIAGNOSIS — Z8679 Personal history of other diseases of the circulatory system: Secondary | ICD-10-CM

## 2013-01-19 DIAGNOSIS — Z86718 Personal history of other venous thrombosis and embolism: Secondary | ICD-10-CM

## 2013-01-19 DIAGNOSIS — E039 Hypothyroidism, unspecified: Secondary | ICD-10-CM

## 2013-01-19 DIAGNOSIS — I2699 Other pulmonary embolism without acute cor pulmonale: Secondary | ICD-10-CM

## 2013-01-19 LAB — POCT INR: INR: 2.1

## 2013-01-19 LAB — TSH: TSH: 5.69 u[IU]/mL — ABNORMAL HIGH (ref 0.35–5.50)

## 2013-01-20 ENCOUNTER — Telehealth: Payer: Self-pay | Admitting: Family Medicine

## 2013-01-20 MED ORDER — LEVOTHYROXINE SODIUM 175 MCG PO TABS: 175.0000 ug | ORAL_TABLET | Freq: Every day | ORAL | Status: DC

## 2013-01-20 NOTE — Telephone Encounter (Signed)
tsh is still high - so I need to increase her thyroid dose from 150 to 175 mcg Please call in  Re check tsh 6 weeks please for hypothyroid

## 2013-01-20 NOTE — Telephone Encounter (Signed)
Pt's husband notified of lab results and advise Rx sent to pharmacy, pt's husband said he will have pt call back to schedule f/u lab appt

## 2013-01-22 ENCOUNTER — Other Ambulatory Visit: Payer: BC Managed Care – PPO

## 2013-01-22 ENCOUNTER — Ambulatory Visit: Payer: BC Managed Care – PPO

## 2013-02-15 ENCOUNTER — Other Ambulatory Visit: Payer: Self-pay | Admitting: Family Medicine

## 2013-02-18 ENCOUNTER — Telehealth: Payer: Self-pay | Admitting: Family Medicine

## 2013-02-18 DIAGNOSIS — E039 Hypothyroidism, unspecified: Secondary | ICD-10-CM

## 2013-02-18 DIAGNOSIS — E119 Type 2 diabetes mellitus without complications: Secondary | ICD-10-CM

## 2013-02-18 NOTE — Telephone Encounter (Signed)
Message copied by Judy Pimple on Wed Feb 18, 2013 10:05 PM ------      Message from: Alvina Chou      Created: Thu Feb 05, 2013  3:15 PM      Regarding: lab orders for Thursday, 5.22.14       1 month f/u labs ------

## 2013-02-19 ENCOUNTER — Other Ambulatory Visit (INDEPENDENT_AMBULATORY_CARE_PROVIDER_SITE_OTHER): Payer: BC Managed Care – PPO

## 2013-02-19 ENCOUNTER — Encounter: Payer: Self-pay | Admitting: *Deleted

## 2013-02-19 DIAGNOSIS — E039 Hypothyroidism, unspecified: Secondary | ICD-10-CM

## 2013-02-19 DIAGNOSIS — E119 Type 2 diabetes mellitus without complications: Secondary | ICD-10-CM

## 2013-02-19 LAB — TSH: TSH: 2.61 u[IU]/mL (ref 0.35–5.50)

## 2013-02-19 LAB — HEMOGLOBIN A1C: Hgb A1c MFr Bld: 6.7 % — ABNORMAL HIGH (ref 4.6–6.5)

## 2013-03-05 ENCOUNTER — Ambulatory Visit: Payer: BC Managed Care – PPO

## 2013-03-11 ENCOUNTER — Other Ambulatory Visit: Payer: Self-pay | Admitting: Family Medicine

## 2013-03-12 ENCOUNTER — Ambulatory Visit (INDEPENDENT_AMBULATORY_CARE_PROVIDER_SITE_OTHER): Payer: BC Managed Care – PPO | Admitting: Family Medicine

## 2013-03-12 ENCOUNTER — Other Ambulatory Visit: Payer: Self-pay | Admitting: Family Medicine

## 2013-03-12 DIAGNOSIS — Z86718 Personal history of other venous thrombosis and embolism: Secondary | ICD-10-CM

## 2013-03-12 DIAGNOSIS — I4891 Unspecified atrial fibrillation: Secondary | ICD-10-CM

## 2013-03-12 DIAGNOSIS — Z8679 Personal history of other diseases of the circulatory system: Secondary | ICD-10-CM

## 2013-03-12 DIAGNOSIS — I2699 Other pulmonary embolism without acute cor pulmonale: Secondary | ICD-10-CM

## 2013-03-12 MED ORDER — WARFARIN SODIUM 5 MG PO TABS: ORAL_TABLET | ORAL | Status: DC

## 2013-03-23 ENCOUNTER — Other Ambulatory Visit: Payer: Self-pay | Admitting: Family Medicine

## 2013-03-23 MED ORDER — ALBUTEROL SULFATE (2.5 MG/3ML) 0.083% IN NEBU: INHALATION_SOLUTION | RESPIRATORY_TRACT | Status: DC

## 2013-04-20 ENCOUNTER — Ambulatory Visit: Payer: BC Managed Care – PPO

## 2013-04-23 ENCOUNTER — Ambulatory Visit: Payer: BC Managed Care – PPO

## 2013-04-29 ENCOUNTER — Other Ambulatory Visit: Payer: Self-pay | Admitting: Family Medicine

## 2013-04-30 ENCOUNTER — Ambulatory Visit (INDEPENDENT_AMBULATORY_CARE_PROVIDER_SITE_OTHER): Payer: BC Managed Care – PPO | Admitting: Family Medicine

## 2013-04-30 DIAGNOSIS — Z86718 Personal history of other venous thrombosis and embolism: Secondary | ICD-10-CM

## 2013-04-30 DIAGNOSIS — I2699 Other pulmonary embolism without acute cor pulmonale: Secondary | ICD-10-CM

## 2013-04-30 DIAGNOSIS — Z8679 Personal history of other diseases of the circulatory system: Secondary | ICD-10-CM

## 2013-04-30 DIAGNOSIS — I4891 Unspecified atrial fibrillation: Secondary | ICD-10-CM

## 2013-05-01 NOTE — Telephone Encounter (Signed)
       Request for Bactroban.  Ok to refill?

## 2013-05-01 NOTE — Telephone Encounter (Signed)
Yes-please refill times 6, thanks

## 2013-05-10 ENCOUNTER — Other Ambulatory Visit: Payer: Self-pay | Admitting: Family Medicine

## 2013-05-22 ENCOUNTER — Other Ambulatory Visit: Payer: Self-pay | Admitting: Family Medicine

## 2013-06-01 ENCOUNTER — Other Ambulatory Visit: Payer: Self-pay | Admitting: Family Medicine

## 2013-06-03 ENCOUNTER — Telehealth: Payer: Self-pay | Admitting: Family Medicine

## 2013-06-03 DIAGNOSIS — E559 Vitamin D deficiency, unspecified: Secondary | ICD-10-CM

## 2013-06-03 DIAGNOSIS — E7800 Pure hypercholesterolemia, unspecified: Secondary | ICD-10-CM

## 2013-06-03 DIAGNOSIS — E119 Type 2 diabetes mellitus without complications: Secondary | ICD-10-CM

## 2013-06-03 DIAGNOSIS — E039 Hypothyroidism, unspecified: Secondary | ICD-10-CM

## 2013-06-03 DIAGNOSIS — E213 Hyperparathyroidism, unspecified: Secondary | ICD-10-CM

## 2013-06-03 NOTE — Telephone Encounter (Signed)
Message copied by Judy Pimple on Wed Jun 03, 2013  7:08 PM ------      Message from: Alvina Chou      Created: Tue May 26, 2013  3:57 PM      Regarding: Lab orders for Thursday, 9.4.14       Lab orders for a 6 month f/u ------

## 2013-06-04 ENCOUNTER — Other Ambulatory Visit (INDEPENDENT_AMBULATORY_CARE_PROVIDER_SITE_OTHER): Payer: BC Managed Care – PPO

## 2013-06-04 ENCOUNTER — Ambulatory Visit (INDEPENDENT_AMBULATORY_CARE_PROVIDER_SITE_OTHER): Payer: BC Managed Care – PPO | Admitting: Family Medicine

## 2013-06-04 DIAGNOSIS — I2699 Other pulmonary embolism without acute cor pulmonale: Secondary | ICD-10-CM

## 2013-06-04 DIAGNOSIS — E119 Type 2 diabetes mellitus without complications: Secondary | ICD-10-CM

## 2013-06-04 DIAGNOSIS — E559 Vitamin D deficiency, unspecified: Secondary | ICD-10-CM

## 2013-06-04 DIAGNOSIS — E7800 Pure hypercholesterolemia, unspecified: Secondary | ICD-10-CM

## 2013-06-04 DIAGNOSIS — E669 Obesity, unspecified: Secondary | ICD-10-CM

## 2013-06-04 DIAGNOSIS — Z86718 Personal history of other venous thrombosis and embolism: Secondary | ICD-10-CM

## 2013-06-04 DIAGNOSIS — Z8679 Personal history of other diseases of the circulatory system: Secondary | ICD-10-CM

## 2013-06-04 DIAGNOSIS — E213 Hyperparathyroidism, unspecified: Secondary | ICD-10-CM

## 2013-06-04 DIAGNOSIS — E039 Hypothyroidism, unspecified: Secondary | ICD-10-CM

## 2013-06-04 DIAGNOSIS — I4891 Unspecified atrial fibrillation: Secondary | ICD-10-CM

## 2013-06-04 LAB — MICROALBUMIN / CREATININE URINE RATIO
Creatinine,U: 81.4 mg/dL
Microalb, Ur: 0.4 mg/dL (ref 0.0–1.9)

## 2013-06-04 LAB — COMPREHENSIVE METABOLIC PANEL
ALT: 22 U/L (ref 0–35)
AST: 19 U/L (ref 0–37)
Albumin: 3.7 g/dL (ref 3.5–5.2)
Alkaline Phosphatase: 90 U/L (ref 39–117)
Calcium: 11.4 mg/dL — ABNORMAL HIGH (ref 8.4–10.5)
Potassium: 3.8 mEq/L (ref 3.5–5.1)
Sodium: 138 mEq/L (ref 135–145)
Total Protein: 6.7 g/dL (ref 6.0–8.3)

## 2013-06-04 LAB — LIPID PANEL
Cholesterol: 132 mg/dL (ref 0–200)
LDL Cholesterol: 62 mg/dL (ref 0–99)
VLDL: 39.8 mg/dL (ref 0.0–40.0)

## 2013-06-04 LAB — TSH: TSH: 2.62 u[IU]/mL (ref 0.35–5.50)

## 2013-06-04 LAB — POCT INR: INR: 2.4

## 2013-06-05 ENCOUNTER — Other Ambulatory Visit: Payer: BC Managed Care – PPO

## 2013-06-12 ENCOUNTER — Encounter: Payer: Self-pay | Admitting: Family Medicine

## 2013-06-12 ENCOUNTER — Ambulatory Visit (INDEPENDENT_AMBULATORY_CARE_PROVIDER_SITE_OTHER): Payer: BC Managed Care – PPO | Admitting: Family Medicine

## 2013-06-12 VITALS — BP 118/72 | HR 69 | Temp 98.2°F

## 2013-06-12 DIAGNOSIS — E119 Type 2 diabetes mellitus without complications: Secondary | ICD-10-CM

## 2013-06-12 DIAGNOSIS — E213 Hyperparathyroidism, unspecified: Secondary | ICD-10-CM

## 2013-06-12 DIAGNOSIS — E039 Hypothyroidism, unspecified: Secondary | ICD-10-CM

## 2013-06-12 DIAGNOSIS — Z23 Encounter for immunization: Secondary | ICD-10-CM

## 2013-06-12 DIAGNOSIS — E559 Vitamin D deficiency, unspecified: Secondary | ICD-10-CM

## 2013-06-12 MED ORDER — DILTIAZEM HCL ER BEADS 120 MG PO CP24: 120.0000 mg | ORAL_CAPSULE | Freq: Every day | ORAL | Status: DC

## 2013-06-12 MED ORDER — LEVOTHYROXINE SODIUM 175 MCG PO TABS: 175.0000 ug | ORAL_TABLET | Freq: Every day | ORAL | Status: DC

## 2013-06-12 MED ORDER — METFORMIN HCL 1000 MG PO TABS: 1000.0000 mg | ORAL_TABLET | Freq: Two times a day (BID) | ORAL | Status: DC

## 2013-06-12 MED ORDER — SIMVASTATIN 20 MG PO TABS: 20.0000 mg | ORAL_TABLET | Freq: Every day | ORAL | Status: DC

## 2013-06-12 MED ORDER — FUROSEMIDE 80 MG PO TABS: 80.0000 mg | ORAL_TABLET | Freq: Two times a day (BID) | ORAL | Status: DC

## 2013-06-12 NOTE — Patient Instructions (Addendum)
I'm glad you had your flu shot today  Diabetes improved - so keep up the good work Don't forget to see the eye doctor for your yearly exam  Follow up in 6 months for annual exam with labs prior

## 2013-06-12 NOTE — Progress Notes (Signed)
Subjective:    Patient ID: Amy Bryant, female    DOB: [birth year], 57 y.o.   MRN: [id number]  HPI Doing ok overall  Here for F/u of chronic conditions  Flu shot today   Hypothyroidism  Pt has no clinical changes No change in energy level/ hair or skin/ edema and no tremor Lab Results  Component Value Date   TSH 2.62 06/04/2013    No changes needed   bp is very good   hyperparathyoidism  Ca 11.4  Vit D level in the 40s- staying on that   Diabetes Home sugar results  DM diet - is making an effort to eat better - cut back on sugar and lost 6 lb - really trying  Cooking very well  Exercise -not very mobile  Symptoms-none  A1C last  Lab Results  Component Value Date   HGBA1C 6.3 06/04/2013  went down from 6.7   No problems with medications  Renal protection-no ace due to bp already low - Last eye exam -- needs to schedule   Hyperlipidemia Lab Results  Component Value Date   CHOL 132 06/04/2013   CHOL 143 11/28/2012   CHOL 140 05/21/2012   Lab Results  Component Value Date   HDL 30.10* 06/04/2013   HDL 40.98* 11/28/2012   HDL 37.50* 05/21/2012   Lab Results  Component Value Date   LDLCALC 62 06/04/2013   LDLCALC 76 05/21/2012   LDLCALC 88 02/08/2011   Lab Results  Component Value Date   TRIG 199.0* 06/04/2013   TRIG 244.0* 11/28/2012   TRIG 133.0 05/21/2012   Lab Results  Component Value Date   CHOLHDL 4 06/04/2013   CHOLHDL 5 11/28/2012   CHOLHDL 4 05/21/2012   Lab Results  Component Value Date   LDLDIRECT 90.7 11/28/2012   LDLDIRECT 95.7 11/22/2011   LDLDIRECT 157.6 08/16/2009   improved with diet   Patient Active Problem List   Diagnosis Date Noted  . Routine general medical examination at a health care facility 05/20/2012  . Atrial fibrillation 11/23/2010  . Pulmonary embolus 11/23/2010  . UNSPECIFIED VITAMIN D DEFICIENCY 11/15/2010  . DYSURIA, HX OF 08/19/2009  . PURE HYPERCHOLESTEROLEMIA 08/16/2009  . ATRIAL FIBRILLATION, HX OF 12/03/2008  . DIABETES  MELLITUS, MILD 08/13/2008  . HYPERPARATHYROIDISM UNSPECIFIED 07/30/2008  . HYPOTHYROIDISM 02/13/2008  . OBESITY 02/04/2008  . REACTIVE AIRWAY DISEASE 02/04/2008  . GERD 02/04/2008  . DISC DISEASE, LUMBAR 02/04/2008  . PULMONARY EMBOLISM, HX OF 02/04/2008  . TOBACCO USE, QUIT 02/04/2008  . OBESITY HYPOVENTILATION SYNDROME 08/11/2007  . COR PULMONALE 08/11/2007   Past Medical History  Diagnosis Date  . GERD (gastroesophageal reflux disease)   . Pulmonary embolism     hx of on coumadin  . Morbid obesity   . Reactive airways dysfunction syndrome   . Cor pulmonale     and obesity hypoventilation syndrome  . Hypothyroid   . OA (osteoarthritis)   . Atrial fibrillation     with cardioversion success   Past Surgical History  Procedure Laterality Date  . Tonsillectomy     History  Substance Use Topics  . Smoking status: Former Games developer  . Smokeless tobacco: Not on file  . Alcohol Use: No   Family History  Problem Relation Age of Onset  . Heart disease Father 109    MI  . Diabetes Brother    Allergies  Allergen Reactions  . Pseudoephedrine     REACTION: tightness in chest   Current Outpatient Prescriptions on  File Prior to Visit  Medication Sig Dispense Refill  . albuterol (PROVENTIL) (2.5 MG/3ML) 0.083% nebulizer solution INHALE 1 VAIL VIA NEBULIZER EVERY 4 HOURS AS NEEDED FOR WHEEZE  150 mL  1  . cholecalciferol (VITAMIN D) 1000 UNITS tablet Take 4,000 Units by mouth daily.      . ciclopirox (LOPROX) 0.77 % cream APPLY TO AFFECTED AREA TWICE A DAY AS NEEDED  90 g  1  . diclofenac (VOLTAREN) 75 MG EC tablet TAKE ONE  BY MOUTH TWICE DAILY AS NEEDED FOR PAIN WITH FOOD  60 tablet  5  . diltiazem (TIAZAC) 120 MG 24 hr capsule TAKE 1 TABLET BY MOUTH DAILY  30 capsule  5  . fluticasone (FLONASE) 50 MCG/ACT nasal spray PLACE TWO SPRAY IN EACH NOSTRIL ONCE DAILY  16 g  0  . Fluticasone-Salmeterol (ADVAIR DISKUS) 250-50 MCG/DOSE AEPB Inhale 1 puff into the lungs every 12 (twelve)  hours.  1 each  11  . furosemide (LASIX) 80 MG tablet TAKE 1 TABLET BY MOUTH TWICE A DAY  60 tablet  0  . glipiZIDE (GLUCOTROL XL) 5 MG 24 hr tablet Take 1 tablet (5 mg total) by mouth daily.  30 tablet  11  . glucose blood test strip Test blood sugar once daily and as needed for DM 250.0  100 each  3  . HYDROcodone-acetaminophen (VICODIN) 5-500 MG per tablet Take 1-2 tablets by mouth every 6 (six) hours as needed for pain.  30 tablet  0  . Lancets (ONETOUCH ULTRASOFT) lancets Test blood sugar once daily and as needed for DM 250.0       . levothyroxine (SYNTHROID, LEVOTHROID) 175 MCG tablet TAKE 1 TABLET EVERY DAY BEFORE BREAKFAST  30 tablet  0  . loratadine (CLARITIN) 10 MG tablet OTC as directed.       . metFORMIN (GLUCOPHAGE) 1000 MG tablet TAKE 1 TABLET TWICE A DAY WITH A MEAL  60 tablet  5  . mometasone (ELOCON) 0.1 % cream Tiny amount to each ear canal twice weekly as needed.      . Multiple Vitamin (MULTIVITAMIN) capsule Take 1 capsule by mouth daily.        . mupirocin ointment (BACTROBAN) 2 % APPLY TO AFFECTED AREA TWICE A DAY  22 g  5  . omeprazole (PRILOSEC) 20 MG capsule Take 20 mg by mouth 2 (two) times daily.       . potassium chloride SA (KLOR-CON M20) 20 MEQ tablet TAKE 2 TABS BY MOUTH ONCE DAILY  60 tablet  3  . simvastatin (ZOCOR) 20 MG tablet TAKE 1 TABLET AT BEDTIME  30 tablet  5  . warfarin (COUMADIN) 5 MG tablet Take as directed by the Anticoagulation Clinic  35 tablet  2   No current facility-administered medications on file prior to visit.      Review of Systems Review of Systems  Constitutional: Negative for fever, appetite change,  and unexpected weight change.  Eyes: Negative for pain and visual disturbance.  Respiratory: Negative for cough and pos for sob on exertion  Cardiovascular: Negative for cp or palpitations    Gastrointestinal: Negative for nausea, diarrhea and constipation.  Genitourinary: Negative for urgency and frequency.  Skin: Negative for pallor  or rash   MSK pos for chronic OA joint pain affecting her mobility  Neurological: Negative for weakness, light-headedness, numbness and headaches.  Hematological: Negative for adenopathy. Does not bruise/bleed easily.  Psychiatric/Behavioral: Negative for dysphoric mood. The patient is not nervous/anxious.  Objective:   Physical Exam  Constitutional: She appears well-developed and well-nourished. No distress.  Morbidly obese in wheelchair  HENT:  Head: Normocephalic and atraumatic.  Mouth/Throat: Oropharynx is clear and moist.  Eyes: Conjunctivae and EOM are normal. Pupils are equal, round, and reactive to light. Right eye exhibits no discharge. Left eye exhibits no discharge. No scleral icterus.  Neck: Normal range of motion. Neck supple. No JVD present. Carotid bruit is not present. No thyromegaly present.  Cardiovascular: Normal rate, regular rhythm and intact distal pulses.   Pulmonary/Chest: Effort normal and breath sounds normal. No respiratory distress. She has no wheezes. She has no rales.  Abdominal: Soft. Bowel sounds are normal. She exhibits no distension, no abdominal bruit and no mass. There is no tenderness.  Musculoskeletal: She exhibits edema. She exhibits no tenderness.  Trace pedal edema  Poor rom knees and spine  Lymphadenopathy:    She has no cervical adenopathy.  Neurological: She is alert. She has normal reflexes. No cranial nerve deficit. She exhibits normal muscle tone. Coordination normal.  Skin: Skin is warm and dry. No rash noted. No erythema. No pallor.  Psychiatric: She has a normal mood and affect.  In good spirits today          Assessment & Plan:

## 2013-06-14 NOTE — Assessment & Plan Note (Signed)
D is theraputic with current dose  Disc imp to bone and overall health

## 2013-06-14 NOTE — Assessment & Plan Note (Signed)
Lab Results  Component Value Date   HGBA1C 6.3 06/04/2013   Improved Commended current effort with diet and wt loss

## 2013-06-14 NOTE — Assessment & Plan Note (Signed)
Clinically stable  Lab Results  Component Value Date   TSH 2.62 06/04/2013   theraputic No changes

## 2013-06-14 NOTE — Assessment & Plan Note (Signed)
Lab Results  Component Value Date   CALCIUM 11.4* 06/04/2013   PHOS 1.9* 05/15/2011    Pt cannot afford consult for this currently  No symptoms  Disc imp of OP prevention

## 2013-06-29 ENCOUNTER — Other Ambulatory Visit: Payer: Self-pay | Admitting: Family Medicine

## 2013-06-29 NOTE — Telephone Encounter (Signed)
Electronic refill request, please advise  

## 2013-06-29 NOTE — Telephone Encounter (Signed)
Please refill for 6 months 

## 2013-06-29 NOTE — Telephone Encounter (Signed)
done

## 2013-06-30 ENCOUNTER — Other Ambulatory Visit: Payer: Self-pay

## 2013-06-30 MED ORDER — FLUTICASONE PROPIONATE 50 MCG/ACT NA SUSP: NASAL | Status: DC

## 2013-06-30 NOTE — Telephone Encounter (Signed)
Pt request refill flonase to [facility]; advised pt done. Pt not having problems uses routinely.

## 2013-07-16 ENCOUNTER — Ambulatory Visit: Payer: BC Managed Care – PPO

## 2013-07-23 ENCOUNTER — Ambulatory Visit: Payer: BC Managed Care – PPO

## 2013-07-30 ENCOUNTER — Ambulatory Visit (INDEPENDENT_AMBULATORY_CARE_PROVIDER_SITE_OTHER): Payer: BC Managed Care – PPO | Admitting: Family Medicine

## 2013-07-30 DIAGNOSIS — I4891 Unspecified atrial fibrillation: Secondary | ICD-10-CM

## 2013-07-30 DIAGNOSIS — Z8679 Personal history of other diseases of the circulatory system: Secondary | ICD-10-CM

## 2013-07-30 DIAGNOSIS — Z86718 Personal history of other venous thrombosis and embolism: Secondary | ICD-10-CM

## 2013-07-30 DIAGNOSIS — I2699 Other pulmonary embolism without acute cor pulmonale: Secondary | ICD-10-CM

## 2013-07-30 LAB — POCT INR: INR: 3.1

## 2013-09-03 ENCOUNTER — Other Ambulatory Visit: Payer: Self-pay | Admitting: Family Medicine

## 2013-09-10 ENCOUNTER — Ambulatory Visit (INDEPENDENT_AMBULATORY_CARE_PROVIDER_SITE_OTHER): Payer: BC Managed Care – PPO | Admitting: Family Medicine

## 2013-09-10 DIAGNOSIS — Z8679 Personal history of other diseases of the circulatory system: Secondary | ICD-10-CM

## 2013-09-10 DIAGNOSIS — Z86718 Personal history of other venous thrombosis and embolism: Secondary | ICD-10-CM

## 2013-09-10 DIAGNOSIS — I2699 Other pulmonary embolism without acute cor pulmonale: Secondary | ICD-10-CM

## 2013-09-10 DIAGNOSIS — I4891 Unspecified atrial fibrillation: Secondary | ICD-10-CM

## 2013-09-21 ENCOUNTER — Other Ambulatory Visit: Payer: Self-pay | Admitting: Family Medicine

## 2013-10-20 ENCOUNTER — Other Ambulatory Visit: Payer: Self-pay | Admitting: Family Medicine

## 2013-10-21 NOTE — Telephone Encounter (Signed)
Rout to [facility] 

## 2013-10-22 ENCOUNTER — Ambulatory Visit: Payer: BC Managed Care – PPO

## 2013-10-27 ENCOUNTER — Other Ambulatory Visit: Payer: Self-pay

## 2013-10-27 MED ORDER — FLUTICASONE PROPIONATE 50 MCG/ACT NA SUSP: NASAL | Status: DC

## 2013-10-27 NOTE — Telephone Encounter (Signed)
Pt left v/m requesting refill fluticasone nasal spray to [facility]. Pt request cb.pt notified done.

## 2013-10-29 ENCOUNTER — Ambulatory Visit: Payer: No Typology Code available for payment source

## 2013-11-05 ENCOUNTER — Ambulatory Visit (INDEPENDENT_AMBULATORY_CARE_PROVIDER_SITE_OTHER): Payer: No Typology Code available for payment source | Admitting: Family Medicine

## 2013-11-05 DIAGNOSIS — Z5181 Encounter for therapeutic drug level monitoring: Secondary | ICD-10-CM | POA: Insufficient documentation

## 2013-11-05 DIAGNOSIS — I2699 Other pulmonary embolism without acute cor pulmonale: Secondary | ICD-10-CM

## 2013-11-05 DIAGNOSIS — I4891 Unspecified atrial fibrillation: Secondary | ICD-10-CM

## 2013-11-05 DIAGNOSIS — Z8679 Personal history of other diseases of the circulatory system: Secondary | ICD-10-CM

## 2013-11-05 DIAGNOSIS — Z86718 Personal history of other venous thrombosis and embolism: Secondary | ICD-10-CM

## 2013-11-05 LAB — POCT INR: INR: 2.5

## 2013-11-24 ENCOUNTER — Other Ambulatory Visit: Payer: Self-pay | Admitting: Family Medicine

## 2013-12-01 ENCOUNTER — Other Ambulatory Visit: Payer: Self-pay | Admitting: Family Medicine

## 2013-12-07 ENCOUNTER — Telehealth: Payer: Self-pay | Admitting: Family Medicine

## 2013-12-07 DIAGNOSIS — E7800 Pure hypercholesterolemia, unspecified: Secondary | ICD-10-CM

## 2013-12-07 DIAGNOSIS — E119 Type 2 diabetes mellitus without complications: Secondary | ICD-10-CM

## 2013-12-07 DIAGNOSIS — Z0000 Encounter for general adult medical examination without abnormal findings: Secondary | ICD-10-CM

## 2013-12-07 DIAGNOSIS — E559 Vitamin D deficiency, unspecified: Secondary | ICD-10-CM

## 2013-12-07 DIAGNOSIS — E213 Hyperparathyroidism, unspecified: Secondary | ICD-10-CM

## 2013-12-07 NOTE — Telephone Encounter (Signed)
Message copied by Judy PimpleWER, MARNE A on Mon Dec 07, 2013  9:25 PM ------      Message from: Alvina ChouWALSH, TERRI J      Created: Mon Dec 07, 2013  3:55 PM      Regarding: Lab orders for Tuesday, 3.10.15       Patient is scheduled for CPX labs, please order future labs, Thanks , Terri       ------

## 2013-12-08 ENCOUNTER — Other Ambulatory Visit (INDEPENDENT_AMBULATORY_CARE_PROVIDER_SITE_OTHER): Payer: No Typology Code available for payment source

## 2013-12-08 DIAGNOSIS — Z0000 Encounter for general adult medical examination without abnormal findings: Secondary | ICD-10-CM

## 2013-12-08 DIAGNOSIS — E559 Vitamin D deficiency, unspecified: Secondary | ICD-10-CM

## 2013-12-08 DIAGNOSIS — E7800 Pure hypercholesterolemia, unspecified: Secondary | ICD-10-CM

## 2013-12-08 DIAGNOSIS — E213 Hyperparathyroidism, unspecified: Secondary | ICD-10-CM

## 2013-12-08 DIAGNOSIS — E119 Type 2 diabetes mellitus without complications: Secondary | ICD-10-CM

## 2013-12-08 LAB — CBC WITH DIFFERENTIAL/PLATELET
Basophils Absolute: 0 10*3/uL (ref 0.0–0.1)
Basophils Relative: 0.4 % (ref 0.0–3.0)
EOS ABS: 0.3 10*3/uL (ref 0.0–0.7)
Eosinophils Relative: 5.3 % — ABNORMAL HIGH (ref 0.0–5.0)
HEMATOCRIT: 39 % (ref 36.0–46.0)
HEMOGLOBIN: 12.7 g/dL (ref 12.0–15.0)
LYMPHS ABS: 1 10*3/uL (ref 0.7–4.0)
Lymphocytes Relative: 20.8 % (ref 12.0–46.0)
MCHC: 32.5 g/dL (ref 30.0–36.0)
MCV: 95.8 fl (ref 78.0–100.0)
Monocytes Absolute: 0.3 10*3/uL (ref 0.1–1.0)
Monocytes Relative: 6.7 % (ref 3.0–12.0)
NEUTROS ABS: 3.3 10*3/uL (ref 1.4–7.7)
Neutrophils Relative %: 66.8 % (ref 43.0–77.0)
Platelets: 200 10*3/uL (ref 150.0–400.0)
RBC: 4.07 Mil/uL (ref 3.87–5.11)
RDW: 13.9 % (ref 11.5–14.6)
WBC: 5 10*3/uL (ref 4.5–10.5)

## 2013-12-08 LAB — HEMOGLOBIN A1C: Hgb A1c MFr Bld: 6.5 % (ref 4.6–6.5)

## 2013-12-08 LAB — COMPREHENSIVE METABOLIC PANEL
ALK PHOS: 97 U/L (ref 39–117)
ALT: 24 U/L (ref 0–35)
AST: 17 U/L (ref 0–37)
Albumin: 3.9 g/dL (ref 3.5–5.2)
BILIRUBIN TOTAL: 0.4 mg/dL (ref 0.3–1.2)
BUN: 21 mg/dL (ref 6–23)
CO2: 30 mEq/L (ref 19–32)
Calcium: 11.4 mg/dL — ABNORMAL HIGH (ref 8.4–10.5)
Chloride: 105 mEq/L (ref 96–112)
Creatinine, Ser: 0.8 mg/dL (ref 0.4–1.2)
GFR: 73.85 mL/min (ref >60.00)
GLUCOSE: 162 mg/dL — AB (ref 70–99)
Potassium: 4 mEq/L (ref 3.5–5.1)
Sodium: 141 mEq/L (ref 135–145)
Total Protein: 6.9 g/dL (ref 6.0–8.3)

## 2013-12-08 LAB — LIPID PANEL
CHOLESTEROL: 146 mg/dL (ref 0–200)
HDL: 34.1 mg/dL — AB (ref >39.00)
LDL CALC: 74 mg/dL (ref 0–99)
TRIGLYCERIDES: 191 mg/dL — AB (ref 0.0–149.0)
Total CHOL/HDL Ratio: 4
VLDL: 38.2 mg/dL (ref 0.0–40.0)

## 2013-12-08 LAB — PHOSPHORUS: Phosphorus: 1.9 mg/dL — ABNORMAL LOW (ref 2.3–4.6)

## 2013-12-08 LAB — TSH: TSH: 2.22 u[IU]/mL (ref 0.35–5.50)

## 2013-12-09 LAB — VITAMIN D 25 HYDROXY (VIT D DEFICIENCY, FRACTURES): Vit D, 25-Hydroxy: 39 ng/mL (ref 30–89)

## 2013-12-15 ENCOUNTER — Encounter: Payer: Self-pay | Admitting: Family Medicine

## 2013-12-15 ENCOUNTER — Ambulatory Visit (INDEPENDENT_AMBULATORY_CARE_PROVIDER_SITE_OTHER): Payer: No Typology Code available for payment source | Admitting: Family Medicine

## 2013-12-15 VITALS — BP 128/74 | HR 70 | Temp 97.8°F

## 2013-12-15 DIAGNOSIS — Z5181 Encounter for therapeutic drug level monitoring: Secondary | ICD-10-CM

## 2013-12-15 DIAGNOSIS — Z1211 Encounter for screening for malignant neoplasm of colon: Secondary | ICD-10-CM

## 2013-12-15 DIAGNOSIS — Z0000 Encounter for general adult medical examination without abnormal findings: Secondary | ICD-10-CM

## 2013-12-15 DIAGNOSIS — I4891 Unspecified atrial fibrillation: Secondary | ICD-10-CM

## 2013-12-15 DIAGNOSIS — Z8679 Personal history of other diseases of the circulatory system: Secondary | ICD-10-CM

## 2013-12-15 DIAGNOSIS — I2699 Other pulmonary embolism without acute cor pulmonale: Secondary | ICD-10-CM

## 2013-12-15 DIAGNOSIS — E669 Obesity, unspecified: Secondary | ICD-10-CM

## 2013-12-15 DIAGNOSIS — E213 Hyperparathyroidism, unspecified: Secondary | ICD-10-CM

## 2013-12-15 DIAGNOSIS — E7800 Pure hypercholesterolemia, unspecified: Secondary | ICD-10-CM

## 2013-12-15 DIAGNOSIS — E039 Hypothyroidism, unspecified: Secondary | ICD-10-CM

## 2013-12-15 DIAGNOSIS — Z86718 Personal history of other venous thrombosis and embolism: Secondary | ICD-10-CM

## 2013-12-15 DIAGNOSIS — E119 Type 2 diabetes mellitus without complications: Secondary | ICD-10-CM

## 2013-12-15 DIAGNOSIS — E559 Vitamin D deficiency, unspecified: Secondary | ICD-10-CM

## 2013-12-15 LAB — POCT INR: INR: 1.9

## 2013-12-15 MED ORDER — POTASSIUM CHLORIDE CRYS ER 20 MEQ PO TBCR: EXTENDED_RELEASE_TABLET | ORAL | Status: DC

## 2013-12-15 NOTE — Progress Notes (Signed)
Pre visit review using our clinic review tool, if applicable. No additional management support is needed unless otherwise documented below in the visit note. 

## 2013-12-15 NOTE — Progress Notes (Signed)
Subjective:    Patient ID: Amy Bryant, female    DOB: 1956/08/04, 58 y.o.   MRN: [id number]  HPI Here for health maintenance exam and to review chronic medical problems    Congestion - from allergies / spring pollen   Mammogram 2010? In [place] - has not had in a while  She declines mammograms -at least this year  Self exam -no lumps / she does check  Colon cancer screening -not up for a colonoscopy  No symptoms  Will do IFOB card   Pap she thinks in [place]  Does not want one today - it is too hard on her  Last abn pap - was in her 55s - that resolved -all nl since  Gyn - no gyn problems or symptoms   Td 10/06 Pneumovax 11/10 Flu shot 9/14  Dm Lab Results  Component Value Date   HGBA1C 6.5 12/08/2013   this is up from 6.3 Overall control is very good  Diet is the same - good days and bad days  On metformin and glipizide  Pt does not want to do DM teaching  opthy exam 2/13- cannot afford right now - she will check will check with her new insurance  microalb ok 9/14  Hyperparathyroid  Lab Results  Component Value Date   CALCIUM 11.4* 12/08/2013   PHOS 1.9* 12/08/2013   this is stable from last time Cannot afford to see an endocrinologist or surgeon She occ gets a cramp - usually in her neck on the left side  dexa -has not had - and not up for that  D level is stable at 39- in therapeutic range with 4000 iu daily   Hyperlipidemia Lab Results  Component Value Date   CHOL 146 12/08/2013   CHOL 132 06/04/2013   CHOL 143 11/28/2012   Lab Results  Component Value Date   HDL 34.10* 12/08/2013   HDL 30.10* 06/04/2013   HDL 40.98* 11/28/2012   Lab Results  Component Value Date   LDLCALC 74 12/08/2013   LDLCALC 62 06/04/2013   LDLCALC 76 05/21/2012   Lab Results  Component Value Date   TRIG 191.0* 12/08/2013   TRIG 199.0* 06/04/2013   TRIG 244.0* 11/28/2012   Lab Results  Component Value Date   CHOLHDL 4 12/08/2013   CHOLHDL 4 06/04/2013   CHOLHDL 5 11/28/2012    Lab Results  Component Value Date   LDLDIRECT 90.7 11/28/2012   LDLDIRECT 95.7 11/22/2011   LDLDIRECT 157.6 08/16/2009   overall pretty stable control  Exercise is too much of a challenge with wt and immobility and lung dz  Has not tried fish oil for a while   Hypothyroidism  Pt has no clinical changes No change in energy level/ hair or skin/ edema and no tremor Lab Results  Component Value Date   TSH 2.22 12/08/2013     No major medicine changes No changes in heart or lungs   At home -she has a scale she can get on and per pt - wt is stable   Patient Active Problem List   Diagnosis Date Noted  . Encounter for therapeutic drug monitoring 11/05/2013  . Routine general medical examination at a health care facility 05/20/2012  . Atrial fibrillation 11/23/2010  . Pulmonary embolus 11/23/2010  . UNSPECIFIED VITAMIN D DEFICIENCY 11/15/2010  . DYSURIA, HX OF 08/19/2009  . PURE HYPERCHOLESTEROLEMIA 08/16/2009  . ATRIAL FIBRILLATION, HX OF 12/03/2008  . DIABETES MELLITUS, MILD 08/13/2008  .  HYPERPARATHYROIDISM UNSPECIFIED 07/30/2008  . HYPOTHYROIDISM 02/13/2008  . OBESITY 02/04/2008  . REACTIVE AIRWAY DISEASE 02/04/2008  . GERD 02/04/2008  . DISC DISEASE, LUMBAR 02/04/2008  . PULMONARY EMBOLISM, HX OF 02/04/2008  . TOBACCO USE, QUIT 02/04/2008  . OBESITY HYPOVENTILATION SYNDROME 08/11/2007  . COR PULMONALE 08/11/2007   Past Medical History  Diagnosis Date  . GERD (gastroesophageal reflux disease)   . Pulmonary embolism     hx of on coumadin  . Morbid obesity   . Reactive airways dysfunction syndrome   . Cor pulmonale     and obesity hypoventilation syndrome  . Hypothyroid   . OA (osteoarthritis)   . Atrial fibrillation     with cardioversion success   Past Surgical History  Procedure Laterality Date  . Tonsillectomy     History  Substance Use Topics  . Smoking status: Former Games developer  . Smokeless tobacco: Not on file  . Alcohol Use: No   Family History    Problem Relation Age of Onset  . Heart disease Father 15    MI  . Diabetes Brother    Allergies  Allergen Reactions  . Pseudoephedrine     REACTION: tightness in chest   Current Outpatient Prescriptions on File Prior to Visit  Medication Sig Dispense Refill  . albuterol (PROVENTIL) (2.5 MG/3ML) 0.083% nebulizer solution INHALE 1 VAIL VIA NEBULIZER EVERY 4 HOURS AS NEEDED FOR WHEEZE  150 mL  1  . cholecalciferol (VITAMIN D) 1000 UNITS tablet Take 4,000 Units by mouth daily.      . ciclopirox (LOPROX) 0.77 % cream APPLY TO AFFECTED AREA TWICE A DAY AS NEEDED  90 g  1  . diclofenac (VOLTAREN) 75 MG EC tablet TAKE ONE TABLET BY MOUTH TWICE DAILY AS NEEDED FOR PAIN WITH FOOD  60 tablet  5  . diltiazem (TIAZAC) 120 MG 24 hr capsule Take 1 capsule (120 mg total) by mouth daily.  30 capsule  11  . fluticasone (FLONASE) 50 MCG/ACT nasal spray PLACE TWO SPRAY IN EACH NOSTRIL ONCE DAILY  16 g  3  . Fluticasone-Salmeterol (ADVAIR DISKUS) 250-50 MCG/DOSE AEPB Inhale 1 puff into the lungs every 12 (twelve) hours.  1 each  11  . furosemide (LASIX) 80 MG tablet Take 1 tablet (80 mg total) by mouth 2 (two) times daily.  60 tablet  11  . glipiZIDE (GLUCOTROL XL) 5 MG 24 hr tablet TAKE 1 TABLET EVERY DAY  30 tablet  5  . glucose blood test strip Test blood sugar once daily and as needed for DM 250.0  100 each  3  . HYDROcodone-acetaminophen (VICODIN) 5-500 MG per tablet Take 1-2 tablets by mouth every 6 (six) hours as needed for pain.  30 tablet  0  . KLOR-CON M20 20 MEQ tablet TAKE 2 TABS BY MOUTH ONCE DAILY  60 tablet  3  . Lancets (ONETOUCH ULTRASOFT) lancets Test blood sugar once daily and as needed for DM 250.0       . levothyroxine (SYNTHROID, LEVOTHROID) 175 MCG tablet Take 1 tablet (175 mcg total) by mouth daily before breakfast.  30 tablet  11  . loratadine (CLARITIN) 10 MG tablet OTC as directed.       . metFORMIN (GLUCOPHAGE) 1000 MG tablet Take 1 tablet (1,000 mg total) by mouth 2 (two) times  daily with a meal.  60 tablet  11  . mometasone (ELOCON) 0.1 % cream Tiny amount to each ear canal twice weekly as needed.      Marland Kitchen  Multiple Vitamin (MULTIVITAMIN) capsule Take 1 capsule by mouth daily.        . mupirocin ointment (BACTROBAN) 2 % APPLY TO AFFECTED AREA TWICE A DAY  22 g  5  . omeprazole (PRILOSEC) 20 MG capsule Take 20 mg by mouth 2 (two) times daily.       . simvastatin (ZOCOR) 20 MG tablet Take 1 tablet (20 mg total) by mouth at bedtime.  30 tablet  11  . warfarin (COUMADIN) 5 MG tablet Take as directed by the Anticoagulation Clinic  35 tablet  2  . warfarin (COUMADIN) 5 MG tablet TAKE AS DIRECTED BY THE ANTICOAGULATION CLINIC  35 tablet  2   No current facility-administered medications on file prior to visit.       Review of Systems Review of Systems  Constitutional: Negative for fever, appetite change,  and unexpected weight change.  Eyes: Negative for pain and visual disturbance.  Respiratory: Negative for cough and pos for baseline shortness of breath.   Cardiovascular: Negative for cp or palpitations    Gastrointestinal: Negative for nausea, diarrhea and constipation.  Genitourinary: Negative for urgency and frequency.  Skin: Negative for pallor or rash   Neurological: Negative for weakness, light-headedness, numbness and headaches.  Hematological: Negative for adenopathy. Does not bruise/bleed easily.  Psychiatric/Behavioral: Negative for dysphoric mood. The patient is not nervous/anxious.         Objective:   Physical Exam  Constitutional: She appears well-developed and well-nourished. No distress.  Morbidly obese in wheelchair with 02 by nasal cannula  HENT:  Head: Normocephalic and atraumatic.  Right Ear: External ear normal.  Left Ear: External ear normal.  Nose: Nose normal.  Mouth/Throat: Oropharynx is clear and moist.  Eyes: Conjunctivae and EOM are normal. Pupils are equal, round, and reactive to light. Right eye exhibits no discharge. Left eye  exhibits no discharge. No scleral icterus.  Neck: Normal range of motion. Neck supple. No JVD present. No thyromegaly present.  Cardiovascular: Normal rate, regular rhythm, normal heart sounds and intact distal pulses.  Exam reveals no gallop.   Pulmonary/Chest: Effort normal and breath sounds normal. No respiratory distress. She has no wheezes. She has no rales.  Diffusely distant bs  No wheezing   Abdominal: Soft. Bowel sounds are normal. She exhibits no distension and no mass. There is no tenderness.  Musculoskeletal: She exhibits edema. She exhibits no tenderness.  Trace pedal edema/ also lymphedema baseline   Lymphadenopathy:    She has no cervical adenopathy.  Neurological: She is alert. She has normal reflexes. No cranial nerve deficit. She exhibits normal muscle tone. Coordination normal.  Skin: Skin is warm and dry. No rash noted. No erythema. No pallor.  Psychiatric: She has a normal mood and affect.          Assessment & Plan:

## 2013-12-15 NOTE — Patient Instructions (Signed)
Watch diet best you can for fats and sugars  To increase your good cholesterol (HDL) - omega 3 fish oil and eating fish help   (fish oil 1000- 3000 mg per day)  Call insurance about advair -find out what is most affordable -- advair with coupon or dulera or symbicort and let me know  Take care of yourself

## 2013-12-18 NOTE — Assessment & Plan Note (Signed)
Discussed how this problem influences overall health and the risks it imposes  Reviewed plan for weight loss with lower calorie diet (via better food choices and also portion control or program like weight watchers) and exercise building up to or more than 30 minutes 5 days per week including some aerobic activity   Pt is overall not motivated for wt loss  She does have mobility issues due to morbid obesity and hypoxia / 02

## 2013-12-18 NOTE — Assessment & Plan Note (Signed)
Lab Results  Component Value Date   HGBA1C 6.5 12/08/2013    On metformin  Rev low glycemic diet and need for wt loss - pt is not very motivated and her morbid obesity causes mobility issues  Re check 6 mo

## 2013-12-18 NOTE — Assessment & Plan Note (Signed)
Vitamin D level is therapeutic with current supplementation Disc importance of this to bone and overall health  

## 2013-12-18 NOTE — Assessment & Plan Note (Signed)
Reviewed health habits including diet and exercise and skin cancer prevention Reviewed appropriate screening tests for age  Also reviewed health mt list, fam hx and immunization status , as well as social and family history   Enc wt loss and dietary changes Labs rev Pt declines some health mt due to cost

## 2013-12-18 NOTE — Assessment & Plan Note (Signed)
Hypothyroidism  Pt has no clinical changes No change in energy level/ hair or skin/ edema and no tremor Lab Results  Component Value Date   TSH 2.22 12/08/2013

## 2013-12-18 NOTE — Assessment & Plan Note (Signed)
Pt cannot afford colonoscopy at this time and also declines it  IFOB card given  No complaints

## 2013-12-18 NOTE — Assessment & Plan Note (Signed)
Disc goals for lipids and reasons to control them Rev labs with pt Rev low sat fat diet in detail   

## 2013-12-18 NOTE — Assessment & Plan Note (Signed)
Stable ca and phos  D level is therapeutic  Pt declines endocrine or surg eval at this time due to lack of finances  Will continue to follow

## 2014-01-29 ENCOUNTER — Telehealth: Payer: Self-pay | Admitting: Family Medicine

## 2014-01-29 MED ORDER — BUDESONIDE-FORMOTEROL FUMARATE 160-4.5 MCG/ACT IN AERO: 2.0000 | INHALATION_SPRAY | Freq: Two times a day (BID) | RESPIRATORY_TRACT | Status: DC

## 2014-01-29 NOTE — Telephone Encounter (Signed)
Pt notified Rx sent to pharmacy

## 2014-01-29 NOTE — Telephone Encounter (Signed)
I sent it  If any problems or side effects let me know- I doubt she will notice a difference

## 2014-01-29 NOTE — Telephone Encounter (Signed)
Pt left vm stating that Dr. Milinda Antisower told her to call our office once she spoke with her insurance company about covering Advair.  They will not cover Advair but will cover Symbicort.  She is requesting Symbicort be called in to [facility] [address].  Please call her at work or leave a message on her home phone.

## 2014-02-04 ENCOUNTER — Ambulatory Visit: Payer: No Typology Code available for payment source

## 2014-02-05 ENCOUNTER — Ambulatory Visit (INDEPENDENT_AMBULATORY_CARE_PROVIDER_SITE_OTHER): Payer: No Typology Code available for payment source | Admitting: Family Medicine

## 2014-02-05 ENCOUNTER — Encounter: Payer: Self-pay | Admitting: Family Medicine

## 2014-02-05 VITALS — BP 110/70 | HR 87 | Temp 98.3°F

## 2014-02-05 DIAGNOSIS — J209 Acute bronchitis, unspecified: Secondary | ICD-10-CM | POA: Insufficient documentation

## 2014-02-05 MED ORDER — AMOXICILLIN-POT CLAVULANATE 875-125 MG PO TABS: 1.0000 | ORAL_TABLET | Freq: Two times a day (BID) | ORAL | Status: DC

## 2014-02-05 MED ORDER — WARFARIN SODIUM 5 MG PO TABS: ORAL_TABLET | ORAL | Status: DC

## 2014-02-05 MED ORDER — AZITHROMYCIN 250 MG PO TABS: ORAL_TABLET | ORAL | Status: DC

## 2014-02-05 MED ORDER — FLUCONAZOLE 150 MG PO TABS: 150.0000 mg | ORAL_TABLET | Freq: Once | ORAL | Status: DC

## 2014-02-05 MED ORDER — HYDROCOD POLST-CHLORPHEN POLST 10-8 MG/5ML PO LQCR: 5.0000 mL | Freq: Two times a day (BID) | ORAL | Status: DC | PRN

## 2014-02-05 MED ORDER — PREDNISONE 10 MG PO TABS: ORAL_TABLET | ORAL | Status: DC

## 2014-02-05 NOTE — Progress Notes (Signed)
Subjective:    Patient ID: Amy Bryant, female    DOB: 12/27/1955, 58 y.o.   MRN: [id number]  HPI Here with uri symptoms  Started Tuesday night  Had a bad sore throat - that has let up a bit  Runny and stuffy nose and headache in face and head (burning and pressure)  Lot of post nasal drip - bad taste - worse when she lies down (esp with mask)  Chest congestion - thick and bright yellow phlegm   Tight breathing  No fever   Has been using albuterol every day - 2-3 times    Patient Active Problem List   Diagnosis Date Noted  . Colon cancer screening 12/15/2013  . Encounter for therapeutic drug monitoring 11/05/2013  . Routine general medical examination at a health care facility 05/20/2012  . Atrial fibrillation 11/23/2010  . Pulmonary embolus 11/23/2010  . UNSPECIFIED VITAMIN D DEFICIENCY 11/15/2010  . DYSURIA, HX OF 08/19/2009  . PURE HYPERCHOLESTEROLEMIA 08/16/2009  . ATRIAL FIBRILLATION, HX OF 12/03/2008  . DIABETES MELLITUS, MILD 08/13/2008  . HYPERPARATHYROIDISM UNSPECIFIED 07/30/2008  . HYPOTHYROIDISM 02/13/2008  . OBESITY 02/04/2008  . REACTIVE AIRWAY DISEASE 02/04/2008  . GERD 02/04/2008  . DISC DISEASE, LUMBAR 02/04/2008  . PULMONARY EMBOLISM, HX OF 02/04/2008  . TOBACCO USE, QUIT 02/04/2008  . OBESITY HYPOVENTILATION SYNDROME 08/11/2007  . COR PULMONALE 08/11/2007   Past Medical History  Diagnosis Date  . GERD (gastroesophageal reflux disease)   . Pulmonary embolism     hx of on coumadin  . Morbid obesity   . Reactive airways dysfunction syndrome   . Cor pulmonale     and obesity hypoventilation syndrome  . Hypothyroid   . OA (osteoarthritis)   . Atrial fibrillation     with cardioversion success   Past Surgical History  Procedure Laterality Date  . Tonsillectomy     History  Substance Use Topics  . Smoking status: Former Games developermoker  . Smokeless tobacco: Not on file  . Alcohol Use: No   Family History  Problem Relation Age of Onset  . Heart  disease Father 2151    MI  . Diabetes Brother    Allergies  Allergen Reactions  . Pseudoephedrine     REACTION: tightness in chest   Current Outpatient Prescriptions on File Prior to Visit  Medication Sig Dispense Refill  . albuterol (PROVENTIL) (2.5 MG/3ML) 0.083% nebulizer solution INHALE 1 VAIL VIA NEBULIZER EVERY 4 HOURS AS NEEDED FOR WHEEZE  150 mL  1  . budesonide-formoterol (SYMBICORT) 160-4.5 MCG/ACT inhaler Inhale 2 puffs into the lungs 2 (two) times daily.  1 Inhaler  11  . cholecalciferol (VITAMIN D) 1000 UNITS tablet Take 4,000 Units by mouth daily.      . ciclopirox (LOPROX) 0.77 % cream APPLY TO AFFECTED AREA TWICE A DAY AS NEEDED  90 g  1  . diclofenac (VOLTAREN) 75 MG EC tablet TAKE ONE TABLET BY MOUTH TWICE DAILY AS NEEDED FOR PAIN WITH FOOD  60 tablet  5  . diltiazem (TIAZAC) 120 MG 24 hr capsule Take 1 capsule (120 mg total) by mouth daily.  30 capsule  11  . fluticasone (FLONASE) 50 MCG/ACT nasal spray PLACE TWO SPRAY IN EACH NOSTRIL ONCE DAILY  16 g  3  . furosemide (LASIX) 80 MG tablet Take 1 tablet (80 mg total) by mouth 2 (two) times daily.  60 tablet  11  . glipiZIDE (GLUCOTROL XL) 5 MG 24 hr tablet TAKE 1 TABLET EVERY DAY  30 tablet  5  . glucose blood test strip Test blood sugar once daily and as needed for DM 250.0  100 each  3  . HYDROcodone-acetaminophen (VICODIN) 5-500 MG per tablet Take 1-2 tablets by mouth every 6 (six) hours as needed for pain.  30 tablet  0  . Lancets (ONETOUCH ULTRASOFT) lancets Test blood sugar once daily and as needed for DM 250.0       . levothyroxine (SYNTHROID, LEVOTHROID) 175 MCG tablet Take 1 tablet (175 mcg total) by mouth daily before breakfast.  30 tablet  11  . loratadine (CLARITIN) 10 MG tablet OTC as directed.       . metFORMIN (GLUCOPHAGE) 1000 MG tablet Take 1 tablet (1,000 mg total) by mouth 2 (two) times daily with a meal.  60 tablet  11  . mometasone (ELOCON) 0.1 % cream Tiny amount to each ear canal twice weekly as  needed.      . Multiple Vitamin (MULTIVITAMIN) capsule Take 1 capsule by mouth daily.        . mupirocin ointment (BACTROBAN) 2 % APPLY TO AFFECTED AREA TWICE A DAY  22 g  5  . omeprazole (PRILOSEC) 20 MG capsule Take 20 mg by mouth 2 (two) times daily.       . potassium chloride SA (KLOR-CON M20) 20 MEQ tablet TAKE 2 TABS BY MOUTH ONCE DAILY  60 tablet  11  . simvastatin (ZOCOR) 20 MG tablet Take 1 tablet (20 mg total) by mouth at bedtime.  30 tablet  11  . warfarin (COUMADIN) 5 MG tablet Take as directed by the Anticoagulation Clinic  35 tablet  2  . warfarin (COUMADIN) 5 MG tablet TAKE AS DIRECTED BY THE ANTICOAGULATION CLINIC  35 tablet  2   No current facility-administered medications on file prior to visit.       Review of Systems Review of Systems  Constitutional: Negative for fever, appetite change,  and unexpected weight change. ENT pos for cong and rhinorrhea and facial pressure and ST  Eyes: Negative for pain and visual disturbance.  Respiratory: pos  for cough and shortness of breath.   Cardiovascular: Negative for cp or palpitations    Gastrointestinal: Negative for nausea, diarrhea and constipation.  Genitourinary: Negative for urgency and frequency.  Skin: Negative for pallor or rash   Neurological: Negative for weakness, light-headedness, numbness and headaches.  Hematological: Negative for adenopathy. Does not bruise/bleed easily.  Psychiatric/Behavioral: Negative for dysphoric mood. The patient is not nervous/anxious.         Objective:   Physical Exam  Constitutional: She appears well-developed and well-nourished. No distress.  Morbidly obese and in wheelchair  HENT:  Head: Normocephalic and atraumatic.  Right Ear: External ear normal.  Left Ear: External ear normal.  Mouth/Throat: Oropharynx is clear and moist. No oropharyngeal exudate.  TMs dull  Nares are injected and congested  Mild maxillary sinus tenderness Throat is clear  Eyes: Conjunctivae and  EOM are normal. Pupils are equal, round, and reactive to light. Right eye exhibits no discharge. Left eye exhibits no discharge. No scleral icterus.  Neck: Normal range of motion. Neck supple.  Cardiovascular: Normal rate and regular rhythm.   Pulmonary/Chest: Effort normal and breath sounds normal. No respiratory distress. She has no wheezes. She has no rales.  Musculoskeletal: She exhibits no edema.  Lymphadenopathy:    She has no cervical adenopathy.  Neurological: She is alert. She has normal reflexes.  Skin: Skin is warm and dry. No rash  noted.  Psychiatric: She has a normal mood and affect.          Assessment & Plan:

## 2014-02-05 NOTE — Patient Instructions (Addendum)
I think you have a bad cold with bronchitis  Drink fluids and rest  Take augmentin  as directed  Take prednisone as directed  tussionex for cough with caution  Diflucan if you get a yeast infection  Continue albuterol every 4 hours as needed  On the way out make an appt. For a protime next week

## 2014-02-05 NOTE — Progress Notes (Signed)
Pre visit review using our clinic review tool, if applicable. No additional management support is needed unless otherwise documented below in the visit note. 

## 2014-02-07 NOTE — Assessment & Plan Note (Addendum)
With hx of reactive airways and obesity hypoventilation syndrome/on 02 Will cover with augmentin  pred taper as directed-disc side eff Albuterol prn  Diflucan if yeast inf from the abx  tussionex with caution for cough Disc symptomatic care - see instructions on AVS  Update if not starting to improve in a week or if worsening    Will plan PT/INR next week in light of new mediations/ abx

## 2014-02-08 ENCOUNTER — Telehealth: Payer: Self-pay

## 2014-02-08 MED ORDER — FLUCONAZOLE 150 MG PO TABS: 150.0000 mg | ORAL_TABLET | Freq: Once | ORAL | Status: DC

## 2014-02-08 MED ORDER — CLARITHROMYCIN 500 MG PO TABS: 500.0000 mg | ORAL_TABLET | Freq: Two times a day (BID) | ORAL | Status: DC

## 2014-02-08 NOTE — Addendum Note (Signed)
Addended by: Shon MilletWATLINGTON, Sandon Yoho M on: 02/08/2014 04:28 PM   Modules accepted: Orders

## 2014-02-08 NOTE — Telephone Encounter (Signed)
Stay off the augmentin  I will send biaxin in to her pharmacy Let me know if rash does not improve Hold your simvastatin while on this -they can interact -and also get protime checked as well

## 2014-02-08 NOTE — Telephone Encounter (Signed)
Spoke with pharmacist and they need a new Rx that says do not take simvastatin while on Rx, new Rx sent into pharmacy

## 2014-02-08 NOTE — Telephone Encounter (Signed)
[facility]

## 2014-02-08 NOTE — Telephone Encounter (Signed)
Pt seen on 02/05/14; pt started augmentin on 02/05/14; on 02/06/14 took another augmentin and pt started itching all over; pt took diflucan, still some itching where broken out and some of rash blistered under breast. No blistering now but red and angry looking. Pt request different antibiotic and another diflucan to [facility]; pt has been taking Benadryl which helps itching. Pt request cb. No swelling or difficulty breathing.

## 2014-02-08 NOTE — Telephone Encounter (Signed)
Pt.notified

## 2014-02-08 NOTE — Telephone Encounter (Signed)
Pt notified new Rx sent to pharmacy and to update us if rash doesn't improve and to hold simvastatin, pt has a coumadin appt already scheudled.  Pt wanted to know if you could send in Rx for yeast inf. See 1st note

## 2014-02-08 NOTE — Telephone Encounter (Signed)
[facility] left v/m requesting cb with instructions for pt about stopping simvastatin while taking biaxin.

## 2014-02-11 ENCOUNTER — Ambulatory Visit (INDEPENDENT_AMBULATORY_CARE_PROVIDER_SITE_OTHER): Payer: No Typology Code available for payment source | Admitting: Family Medicine

## 2014-02-11 DIAGNOSIS — Z5181 Encounter for therapeutic drug level monitoring: Secondary | ICD-10-CM

## 2014-02-11 DIAGNOSIS — I2699 Other pulmonary embolism without acute cor pulmonale: Secondary | ICD-10-CM

## 2014-02-11 DIAGNOSIS — Z8679 Personal history of other diseases of the circulatory system: Secondary | ICD-10-CM

## 2014-02-11 DIAGNOSIS — I4891 Unspecified atrial fibrillation: Secondary | ICD-10-CM

## 2014-02-11 DIAGNOSIS — Z86718 Personal history of other venous thrombosis and embolism: Secondary | ICD-10-CM

## 2014-02-11 LAB — POCT INR: INR: 1.9

## 2014-02-16 ENCOUNTER — Other Ambulatory Visit: Payer: Self-pay | Admitting: Family Medicine

## 2014-02-18 ENCOUNTER — Ambulatory Visit: Payer: No Typology Code available for payment source

## 2014-03-08 ENCOUNTER — Other Ambulatory Visit: Payer: Self-pay | Admitting: Family Medicine

## 2014-03-09 NOTE — Telephone Encounter (Signed)
Ok to refill 

## 2014-03-09 NOTE — Telephone Encounter (Signed)
Please refill times 3 Thanks  

## 2014-03-25 ENCOUNTER — Ambulatory Visit: Payer: No Typology Code available for payment source

## 2014-04-08 ENCOUNTER — Ambulatory Visit: Payer: No Typology Code available for payment source

## 2014-05-06 ENCOUNTER — Ambulatory Visit (INDEPENDENT_AMBULATORY_CARE_PROVIDER_SITE_OTHER): Payer: No Typology Code available for payment source | Admitting: Family Medicine

## 2014-05-06 DIAGNOSIS — I2699 Other pulmonary embolism without acute cor pulmonale: Secondary | ICD-10-CM

## 2014-05-06 DIAGNOSIS — Z86718 Personal history of other venous thrombosis and embolism: Secondary | ICD-10-CM

## 2014-05-06 DIAGNOSIS — Z5181 Encounter for therapeutic drug level monitoring: Secondary | ICD-10-CM

## 2014-05-06 DIAGNOSIS — I4891 Unspecified atrial fibrillation: Secondary | ICD-10-CM

## 2014-05-06 DIAGNOSIS — Z8679 Personal history of other diseases of the circulatory system: Secondary | ICD-10-CM

## 2014-05-06 LAB — POCT INR: INR: 2.1

## 2014-05-09 ENCOUNTER — Other Ambulatory Visit: Payer: Self-pay | Admitting: Family Medicine

## 2014-05-19 ENCOUNTER — Other Ambulatory Visit: Payer: Self-pay | Admitting: Family Medicine

## 2014-06-10 ENCOUNTER — Telehealth: Payer: Self-pay | Admitting: Family Medicine

## 2014-06-10 ENCOUNTER — Other Ambulatory Visit: Payer: Self-pay | Admitting: Family Medicine

## 2014-06-10 DIAGNOSIS — E119 Type 2 diabetes mellitus without complications: Secondary | ICD-10-CM

## 2014-06-10 DIAGNOSIS — E213 Hyperparathyroidism, unspecified: Secondary | ICD-10-CM

## 2014-06-10 NOTE — Telephone Encounter (Signed)
Message copied by Judy Pimple on Thu Jun 10, 2014 11:22 AM ------      Message from: Alvina Chou      Created: Wed Jun 02, 2014 10:44 AM      Regarding: Lab orders for Friday, 9.11.15       Lab orders for a 6 month f/u ------

## 2014-06-11 ENCOUNTER — Other Ambulatory Visit (INDEPENDENT_AMBULATORY_CARE_PROVIDER_SITE_OTHER): Payer: No Typology Code available for payment source

## 2014-06-11 DIAGNOSIS — E213 Hyperparathyroidism, unspecified: Secondary | ICD-10-CM

## 2014-06-11 DIAGNOSIS — E119 Type 2 diabetes mellitus without complications: Secondary | ICD-10-CM

## 2014-06-11 LAB — CALCIUM: Calcium: 11.5 mg/dL — ABNORMAL HIGH (ref 8.4–10.5)

## 2014-06-11 LAB — PHOSPHORUS: Phosphorus: 1.9 mg/dL — ABNORMAL LOW (ref 2.3–4.6)

## 2014-06-11 LAB — HEMOGLOBIN A1C: Hgb A1c MFr Bld: 6.8 % — ABNORMAL HIGH (ref 4.6–6.5)

## 2014-06-18 ENCOUNTER — Ambulatory Visit (INDEPENDENT_AMBULATORY_CARE_PROVIDER_SITE_OTHER): Payer: No Typology Code available for payment source | Admitting: Family Medicine

## 2014-06-18 ENCOUNTER — Encounter: Payer: Self-pay | Admitting: Family Medicine

## 2014-06-18 VITALS — BP 104/62 | HR 70 | Temp 98.4°F

## 2014-06-18 DIAGNOSIS — I2699 Other pulmonary embolism without acute cor pulmonale: Secondary | ICD-10-CM

## 2014-06-18 DIAGNOSIS — E119 Type 2 diabetes mellitus without complications: Secondary | ICD-10-CM

## 2014-06-18 DIAGNOSIS — Z86718 Personal history of other venous thrombosis and embolism: Secondary | ICD-10-CM

## 2014-06-18 DIAGNOSIS — E213 Hyperparathyroidism, unspecified: Secondary | ICD-10-CM

## 2014-06-18 DIAGNOSIS — Z23 Encounter for immunization: Secondary | ICD-10-CM

## 2014-06-18 DIAGNOSIS — E669 Obesity, unspecified: Secondary | ICD-10-CM

## 2014-06-18 DIAGNOSIS — I4891 Unspecified atrial fibrillation: Secondary | ICD-10-CM

## 2014-06-18 DIAGNOSIS — Z5181 Encounter for therapeutic drug level monitoring: Secondary | ICD-10-CM

## 2014-06-18 DIAGNOSIS — Z8679 Personal history of other diseases of the circulatory system: Secondary | ICD-10-CM

## 2014-06-18 LAB — POCT INR: INR: 2.7

## 2014-06-18 MED ORDER — FLUTICASONE PROPIONATE 50 MCG/ACT NA SUSP: NASAL | Status: DC

## 2014-06-18 MED ORDER — ALBUTEROL SULFATE (2.5 MG/3ML) 0.083% IN NEBU: INHALATION_SOLUTION | RESPIRATORY_TRACT | Status: DC

## 2014-06-18 MED ORDER — METFORMIN HCL 1000 MG PO TABS: 1000.0000 mg | ORAL_TABLET | Freq: Two times a day (BID) | ORAL | Status: DC

## 2014-06-18 MED ORDER — SIMVASTATIN 20 MG PO TABS: 20.0000 mg | ORAL_TABLET | Freq: Every day | ORAL | Status: DC

## 2014-06-18 MED ORDER — DICLOFENAC SODIUM 75 MG PO TBEC: DELAYED_RELEASE_TABLET | ORAL | Status: DC

## 2014-06-18 MED ORDER — FUROSEMIDE 80 MG PO TABS: 80.0000 mg | ORAL_TABLET | Freq: Two times a day (BID) | ORAL | Status: DC

## 2014-06-18 MED ORDER — GLIPIZIDE ER 5 MG PO TB24: ORAL_TABLET | ORAL | Status: DC

## 2014-06-18 MED ORDER — DILTIAZEM HCL ER BEADS 120 MG PO CP24: 120.0000 mg | ORAL_CAPSULE | Freq: Every day | ORAL | Status: DC

## 2014-06-18 NOTE — Progress Notes (Signed)
Subjective:    Patient ID: Amy Bryant, female    DOB: November 25, 1955, 58 y.o.   MRN: [id number]  HPI Here for f/u of chronic health problems   Nothing new going on  Tries to take care of herself   Diabetes Home sugar results - usually 110-114 in am - highest 146 in the evening  DM diet - same / fair  Exercise - very limited with obesity and breathing  Symptoms A1C last  Lab Results  Component Value Date   HGBA1C 6.8* 06/11/2014  was 6.5 - overall stable  No problems with medications - glipizide and metformin  Renal protection-not on ace - BP: 104/62 mmHg   Last eye exam - still cannot go for one due to finances    Hyperparathyroid Lab Results  Component Value Date   CALCIUM 11.5* 06/11/2014   PHOS 1.9* 06/11/2014   This is stable  She cannot afford specialist visit for this No muscle cramps/ weakness or other symptoms   Pt cannot get on scale today  Immobile Morbidly obese Eats fair  Cannot exercise (per pt) She does not have high hopes for loosing wt     Needs flu shot today  Patient Active Problem List   Diagnosis Date Noted  . Acute bronchitis with bronchospasm 02/05/2014  . Colon cancer screening 12/15/2013  . Encounter for therapeutic drug monitoring 11/05/2013  . Routine general medical examination at a health care facility 05/20/2012  . Atrial fibrillation 11/23/2010  . Pulmonary embolus 11/23/2010  . UNSPECIFIED VITAMIN D DEFICIENCY 11/15/2010  . DYSURIA, HX OF 08/19/2009  . PURE HYPERCHOLESTEROLEMIA 08/16/2009  . ATRIAL FIBRILLATION, HX OF 12/03/2008  . DIABETES MELLITUS, MILD 08/13/2008  . HYPERPARATHYROIDISM UNSPECIFIED 07/30/2008  . HYPOTHYROIDISM 02/13/2008  . OBESITY 02/04/2008  . REACTIVE AIRWAY DISEASE 02/04/2008  . GERD 02/04/2008  . DISC DISEASE, LUMBAR 02/04/2008  . PULMONARY EMBOLISM, HX OF 02/04/2008  . TOBACCO USE, QUIT 02/04/2008  . OBESITY HYPOVENTILATION SYNDROME 08/11/2007  . COR PULMONALE 08/11/2007   Past Medical History    Diagnosis Date  . GERD (gastroesophageal reflux disease)   . Pulmonary embolism     hx of on coumadin  . Morbid obesity   . Reactive airways dysfunction syndrome   . Cor pulmonale     and obesity hypoventilation syndrome  . Hypothyroid   . OA (osteoarthritis)   . Atrial fibrillation     with cardioversion success   Past Surgical History  Procedure Laterality Date  . Tonsillectomy     History  Substance Use Topics  . Smoking status: Former Games developer  . Smokeless tobacco: Not on file  . Alcohol Use: No   Family History  Problem Relation Age of Onset  . Heart disease Father 46    MI  . Diabetes Brother    Allergies  Allergen Reactions  . Augmentin [Amoxicillin-Pot Clavulanate] Other (See Comments)    Rash itching  . Pseudoephedrine     REACTION: tightness in chest   Current Outpatient Prescriptions on File Prior to Visit  Medication Sig Dispense Refill  . amoxicillin-clavulanate (AUGMENTIN) 875-125 MG per tablet Take 1 tablet by mouth 2 (two) times daily.  20 tablet  0  . budesonide-formoterol (SYMBICORT) 160-4.5 MCG/ACT inhaler Inhale 2 puffs into the lungs 2 (two) times daily.  1 Inhaler  11  . chlorpheniramine-HYDROcodone (TUSSIONEX PENNKINETIC ER) 10-8 MG/5ML LQCR Take 5 mLs by mouth every 12 (twelve) hours as needed for cough (careful of sedation).  140 mL  0  .  cholecalciferol (VITAMIN D) 1000 UNITS tablet Take 4,000 Units by mouth daily.      . ciclopirox (LOPROX) 0.77 % cream APPLY TO AFFECTED AREA TWICE A DAY AS NEEDED  90 g  1  . clarithromycin (BIAXIN) 500 MG tablet Take 1 tablet (500 mg total) by mouth 2 (two) times daily. *do not take simvastatin while on this medication*  14 tablet  0  . fluconazole (DIFLUCAN) 150 MG tablet Take 1 tablet (150 mg total) by mouth once.  1 tablet  0  . glucose blood test strip Test blood sugar once daily and as needed for DM 250.0  100 each  3  . HYDROcodone-acetaminophen (VICODIN) 5-500 MG per tablet Take 1-2 tablets by mouth  every 6 (six) hours as needed for pain.  30 tablet  0  . Lancets (ONETOUCH ULTRASOFT) lancets Test blood sugar once daily and as needed for DM 250.0       . levothyroxine (SYNTHROID, LEVOTHROID) 175 MCG tablet TAKE 1 TABLET BY MOUTH DAILY BEFORE BREAKFAST  30 tablet  0  . loratadine (CLARITIN) 10 MG tablet OTC as directed.       . mometasone (ELOCON) 0.1 % cream Tiny amount to each ear canal twice weekly as needed.      . Multiple Vitamin (MULTIVITAMIN) capsule Take 1 capsule by mouth daily.        . mupirocin ointment (BACTROBAN) 2 % APPLY TO AFFECTED AREA TWICE A DAY  22 g  3  . omega-3 acid ethyl esters (LOVAZA) 1 G capsule Take by mouth 2 (two) times daily.      . Omega-3 Fatty Acids (FISH OIL PO) Take 1 tablet by mouth daily.      Marland Kitchen omeprazole (PRILOSEC) 20 MG capsule Take 20 mg by mouth 2 (two) times daily.       . potassium chloride SA (KLOR-CON M20) 20 MEQ tablet TAKE 2 TABS BY MOUTH ONCE DAILY  60 tablet  11  . predniSONE (DELTASONE) 10 MG tablet Take 3 pills once daily by mouth for 3 days, then 2 pills once daily for 3 days, then 1 pill once daily for 3 days and then stop  18 tablet  0  . warfarin (COUMADIN) 5 MG tablet Take as directed by the Anticoagulation Clinic  35 tablet  2  . warfarin (COUMADIN) 5 MG tablet TAKE AS DIRECTED BY THE ANTICOAGULATION CLINIC  45 tablet  3   No current facility-administered medications on file prior to visit.     Review of Systems Review of Systems  Constitutional: Negative for fever, appetite change,  and unexpected weight change. pos for fatigue Eyes: Negative for pain and visual disturbance.  Respiratory: Negative for cough and pos for baseline  shortness of breath.  (wearing 02) Cardiovascular: Negative for cp or palpitations    Gastrointestinal: Negative for nausea, diarrhea and constipation.  Genitourinary: Negative for urgency and frequency.  Skin: Negative for pallor or rash   Neurological: Negative for weakness, light-headedness,  numbness and headaches.  Hematological: Negative for adenopathy. Does not bruise/bleed easily.  Psychiatric/Behavioral: Negative for dysphoric mood. The patient is not nervous/anxious.         Objective:   Physical Exam  Constitutional: She appears well-developed and well-nourished. No distress.  Morbidly obese and well appearing   HENT:  Head: Normocephalic and atraumatic.  Mouth/Throat: Oropharynx is clear and moist.  Eyes: Conjunctivae and EOM are normal. Pupils are equal, round, and reactive to light. No scleral icterus.  Neck: Normal  range of motion. Neck supple. No JVD present. No thyromegaly present.  Cardiovascular: Normal rate, normal heart sounds and intact distal pulses.  Exam reveals no gallop.   Pulmonary/Chest: Effort normal and breath sounds normal. No respiratory distress. She has no wheezes. She has no rales.  Diffusely distant bs  No wheeze   Musculoskeletal: She exhibits no edema.  Lymphadenopathy:    She has no cervical adenopathy.  Neurological: She is alert. She has normal reflexes. No cranial nerve deficit. She exhibits normal muscle tone. Coordination normal.  No tremor   Skin: Skin is warm and dry. No rash noted. No pallor.  Psychiatric: She has a normal mood and affect.          Assessment & Plan:   Problem List Items Addressed This Visit     Endocrine   DIABETES MELLITUS, MILD - Primary      Lab Results  Component Value Date   HGBA1C 6.8* 06/11/2014   Overall fairly stable/ up a bit  Continue to enc wt loss and DM diet  Cannot afford eye exam yet  F/u 6 mo    Relevant Medications      metFORMIN (GLUCOPHAGE) tablet      glipiZIDE (GLUCOTROL XL) 24 hr tablet      simvastatin (ZOCOR) tablet   HYPERPARATHYROIDISM UNSPECIFIED     Pt cannot afford to see a specialist for this -her ca and phos is stable   Will continue to monitor  Re check 6 mo with D level      Other   OBESITY     Discussed how this problem influences overall health and  the risks it imposes  Reviewed plan for weight loss with lower calorie diet (via better food choices and also portion control or program like weight watchers) and exercise building up to or more than 30 minutes 5 days per week including some aerobic activity   she is limited due to sob/ mobility impaired     Relevant Medications      metFORMIN (GLUCOPHAGE) tablet      glipiZIDE (GLUCOTROL XL) 24 hr tablet    Other Visit Diagnoses   Need for prophylactic vaccination and inoculation against influenza        Relevant Orders       Flu Vaccine QUAD 36+ mos PF IM (Fluarix Quad PF) (Completed)

## 2014-06-18 NOTE — Progress Notes (Signed)
Pre visit review using our clinic review tool, if applicable. No additional management support is needed unless otherwise documented below in the visit note. 

## 2014-06-18 NOTE — Patient Instructions (Signed)
Flu shot today Then protime  I sent px to the pharmacy  Please try to stick to a diabetic diet the best you can  Parathyroid function looks to be stable based on calcium /phos levels  A1C is up slightly  Follow up in 6 months for annual exam with labs prior

## 2014-06-18 NOTE — Assessment & Plan Note (Signed)
Pt cannot afford to see a specialist for this -her ca and phos is stable   Will continue to monitor  Re check 6 mo with D level

## 2014-06-18 NOTE — Assessment & Plan Note (Signed)
Lab Results  Component Value Date   HGBA1C 6.8* 06/11/2014   Overall fairly stable/ up a bit  Continue to enc wt loss and DM diet  Cannot afford eye exam yet  F/u 6 mo

## 2014-06-20 NOTE — Assessment & Plan Note (Signed)
Discussed how this problem influences overall health and the risks it imposes  Reviewed plan for weight loss with lower calorie diet (via better food choices and also portion control or program like weight watchers) and exercise building up to or more than 30 minutes 5 days per week including some aerobic activity   she is limited due to sob/ mobility impaired

## 2014-07-06 ENCOUNTER — Other Ambulatory Visit: Payer: Self-pay | Admitting: Family Medicine

## 2014-08-02 ENCOUNTER — Ambulatory Visit: Payer: No Typology Code available for payment source

## 2014-08-09 ENCOUNTER — Ambulatory Visit: Payer: No Typology Code available for payment source

## 2014-08-16 ENCOUNTER — Other Ambulatory Visit: Payer: No Typology Code available for payment source

## 2014-08-16 ENCOUNTER — Ambulatory Visit: Payer: No Typology Code available for payment source

## 2014-08-18 ENCOUNTER — Other Ambulatory Visit: Payer: Self-pay | Admitting: Family Medicine

## 2014-08-18 NOTE — Telephone Encounter (Signed)
Electronic refill request, please advise  

## 2014-08-18 NOTE — Telephone Encounter (Signed)
Please refill times 5 

## 2014-08-18 NOTE — Telephone Encounter (Signed)
done

## 2014-08-19 ENCOUNTER — Other Ambulatory Visit: Payer: Self-pay | Admitting: Family Medicine

## 2014-08-19 ENCOUNTER — Telehealth: Payer: Self-pay | Admitting: Family Medicine

## 2014-08-19 NOTE — Telephone Encounter (Deleted)
ACE inhibitor therapy was not prescribed due to

## 2014-08-20 NOTE — Telephone Encounter (Signed)
Please reschedule at her convenience.

## 2014-08-20 NOTE — Telephone Encounter (Signed)
done

## 2014-08-20 NOTE — Telephone Encounter (Signed)
Please refill times one  

## 2014-08-20 NOTE — Telephone Encounter (Signed)
Patient did not come for their scheduled [facility] appointment 08/16/14 @ 3:45pm.  Please let me know if the patient needs to be contacted immediately for follow up or if no follow up is necessary.

## 2014-08-20 NOTE — Telephone Encounter (Signed)
Electronic refill request, Blair out of office, please advise  

## 2014-09-01 ENCOUNTER — Other Ambulatory Visit: Payer: Self-pay | Admitting: Family Medicine

## 2014-09-01 DIAGNOSIS — I2699 Other pulmonary embolism without acute cor pulmonale: Secondary | ICD-10-CM

## 2014-09-01 DIAGNOSIS — Z5181 Encounter for therapeutic drug level monitoring: Secondary | ICD-10-CM

## 2014-09-01 DIAGNOSIS — I4891 Unspecified atrial fibrillation: Secondary | ICD-10-CM

## 2014-09-06 ENCOUNTER — Other Ambulatory Visit: Payer: Self-pay | Admitting: Family Medicine

## 2014-09-06 NOTE — Telephone Encounter (Signed)
Please refill times one  

## 2014-09-06 NOTE — Telephone Encounter (Signed)
Received refill request electronically. Last refill 05/09/14, last office visit 06/18/14. Is it okay to refill medication?

## 2014-09-06 NOTE — Telephone Encounter (Signed)
done

## 2014-09-09 ENCOUNTER — Other Ambulatory Visit (INDEPENDENT_AMBULATORY_CARE_PROVIDER_SITE_OTHER): Payer: No Typology Code available for payment source

## 2014-09-09 DIAGNOSIS — Z5181 Encounter for therapeutic drug level monitoring: Secondary | ICD-10-CM

## 2014-09-09 DIAGNOSIS — I2699 Other pulmonary embolism without acute cor pulmonale: Secondary | ICD-10-CM

## 2014-09-09 DIAGNOSIS — I4891 Unspecified atrial fibrillation: Secondary | ICD-10-CM

## 2014-09-09 LAB — PROTIME-INR
INR: 2.8 ratio — ABNORMAL HIGH (ref 0.8–1.0)
Prothrombin Time: 30.7 s — ABNORMAL HIGH (ref 9.6–13.1)

## 2014-09-10 ENCOUNTER — Telehealth: Payer: Self-pay | Admitting: Family Medicine

## 2014-09-10 NOTE — Telephone Encounter (Signed)
Pt called checking on her pt/inr She needs to know what dosage of meds she needs to take  You can leave a message on home number [phone]

## 2014-09-10 NOTE — Telephone Encounter (Signed)
Addressed through results notes  

## 2014-09-13 ENCOUNTER — Other Ambulatory Visit: Payer: Self-pay | Admitting: Family Medicine

## 2014-09-13 NOTE — Telephone Encounter (Signed)
Electronic refill request, Amy Bryant not in office please advise

## 2014-09-13 NOTE — Telephone Encounter (Signed)
Please refill times 3 

## 2014-09-14 NOTE — Telephone Encounter (Signed)
done

## 2014-10-18 ENCOUNTER — Telehealth: Payer: Self-pay

## 2014-10-18 MED ORDER — MOMETASONE FUROATE 50 MCG/ACT NA SUSP: 2.0000 | Freq: Every day | NASAL | Status: DC

## 2014-10-18 NOTE — Telephone Encounter (Signed)
Px sent for nasonex electronically

## 2014-10-18 NOTE — Telephone Encounter (Signed)
Pt left v/m fluticasone nasal spray is no longer covered by ins; Nasonex is covered and pt request Nasonex sent to [facility] [address]

## 2014-10-18 NOTE — Telephone Encounter (Signed)
Patient advised.

## 2014-10-25 ENCOUNTER — Other Ambulatory Visit: Payer: Self-pay | Admitting: Family Medicine

## 2014-10-25 NOTE — Telephone Encounter (Signed)
Please refill times 3 Thanks  

## 2014-10-25 NOTE — Telephone Encounter (Signed)
Last office visit 06/18/2014. Last refilled 09/06/2014 for 90 g with no refills.  Ok to refill?

## 2014-11-01 ENCOUNTER — Telehealth: Payer: Self-pay | Admitting: *Deleted

## 2014-11-01 NOTE — Telephone Encounter (Signed)
Patient requested to drop a urine specimen tomorrow because she thinks that she may have an infection. Patient stated that she has blood in her urine, backache, frequency which stated Saturday. Advised patient that she would need to be seen. Offered patient an appointment today which she declined stating that there is no way that she can come in today. Appointment scheduled for 11/02/14.

## 2014-11-01 NOTE — Telephone Encounter (Signed)
Will see her then 

## 2014-11-02 ENCOUNTER — Ambulatory Visit (INDEPENDENT_AMBULATORY_CARE_PROVIDER_SITE_OTHER): Payer: 59 | Admitting: Family Medicine

## 2014-11-02 ENCOUNTER — Encounter: Payer: Self-pay | Admitting: Family Medicine

## 2014-11-02 VITALS — BP 110/74 | HR 69 | Temp 98.4°F

## 2014-11-02 DIAGNOSIS — I4891 Unspecified atrial fibrillation: Secondary | ICD-10-CM

## 2014-11-02 DIAGNOSIS — R319 Hematuria, unspecified: Secondary | ICD-10-CM

## 2014-11-02 DIAGNOSIS — N3001 Acute cystitis with hematuria: Secondary | ICD-10-CM

## 2014-11-02 DIAGNOSIS — R350 Frequency of micturition: Secondary | ICD-10-CM

## 2014-11-02 DIAGNOSIS — N390 Urinary tract infection, site not specified: Secondary | ICD-10-CM | POA: Insufficient documentation

## 2014-11-02 LAB — POCT URINALYSIS DIPSTICK
Bilirubin, UA: NEGATIVE
Glucose, UA: NEGATIVE
KETONES UA: NEGATIVE
LEUKOCYTES UA: NEGATIVE
Nitrite, UA: NEGATIVE
Protein, UA: NEGATIVE
Spec Grav, UA: 1.025
Urobilinogen, UA: 2
pH, UA: 5.5

## 2014-11-02 LAB — POCT INR: INR: 2.6

## 2014-11-02 MED ORDER — FLUCONAZOLE 150 MG PO TABS: 150.0000 mg | ORAL_TABLET | Freq: Once | ORAL | Status: DC

## 2014-11-02 MED ORDER — SULFAMETHOXAZOLE-TRIMETHOPRIM 800-160 MG PO TABS: 1.0000 | ORAL_TABLET | Freq: Two times a day (BID) | ORAL | Status: DC

## 2014-11-02 NOTE — Progress Notes (Signed)
Subjective:    Patient ID: Amy Bryant, female    DOB: [birth year], 59 y.o.   MRN: [id number]  HPI Here for urinary symptoms   Urine has had blood in it in the am - clears as the day goes on  Burns to urinate and urgency  Frequency (she holds it too long at work admittedly)  First noticed it on Friday   No bladder pain   No fever or nausea   Also some L flank pain -not severe  Patient Active Problem List   Diagnosis Date Noted  . Acute bronchitis with bronchospasm 02/05/2014  . Colon cancer screening 12/15/2013  . Encounter for therapeutic drug monitoring 11/05/2013  . Routine general medical examination at a health care facility 05/20/2012  . Atrial fibrillation 11/23/2010  . Pulmonary embolus 11/23/2010  . UNSPECIFIED VITAMIN D DEFICIENCY 11/15/2010  . DYSURIA, HX OF 08/19/2009  . PURE HYPERCHOLESTEROLEMIA 08/16/2009  . ATRIAL FIBRILLATION, HX OF 12/03/2008  . DIABETES MELLITUS, MILD 08/13/2008  . HYPERPARATHYROIDISM UNSPECIFIED 07/30/2008  . HYPOTHYROIDISM 02/13/2008  . OBESITY 02/04/2008  . REACTIVE AIRWAY DISEASE 02/04/2008  . GERD 02/04/2008  . DISC DISEASE, LUMBAR 02/04/2008  . PULMONARY EMBOLISM, HX OF 02/04/2008  . TOBACCO USE, QUIT 02/04/2008  . OBESITY HYPOVENTILATION SYNDROME 08/11/2007  . COR PULMONALE 08/11/2007   Past Medical History  Diagnosis Date  . GERD (gastroesophageal reflux disease)   . Pulmonary embolism     hx of on coumadin  . Morbid obesity   . Reactive airways dysfunction syndrome   . Cor pulmonale     and obesity hypoventilation syndrome  . Hypothyroid   . OA (osteoarthritis)   . Atrial fibrillation     with cardioversion success   Past Surgical History  Procedure Laterality Date  . Tonsillectomy     History  Substance Use Topics  . Smoking status: Former Games developer  . Smokeless tobacco: Not on file  . Alcohol Use: No   Family History  Problem Relation Age of Onset  . Heart disease Father 75    MI  . Diabetes Brother      Allergies  Allergen Reactions  . Augmentin [Amoxicillin-Pot Clavulanate] Other (See Comments)    Rash itching  . Pseudoephedrine     REACTION: tightness in chest   Current Outpatient Prescriptions on File Prior to Visit  Medication Sig Dispense Refill  . albuterol (PROVENTIL) (2.5 MG/3ML) 0.083% nebulizer solution INHALE 1 VAIL VIA NEBULIZER EVERY 4 HOURS AS NEEDED FOR WHEEZE 150 mL 11  . budesonide-formoterol (SYMBICORT) 160-4.5 MCG/ACT inhaler Inhale 2 puffs into the lungs 2 (two) times daily. 1 Inhaler 11  . cholecalciferol (VITAMIN D) 1000 UNITS tablet Take 4,000 Units by mouth daily.    . ciclopirox (LOPROX) 0.77 % cream APPLY TO AFFECTED AREA TWICE A DAY AS NEEDED 90 g 3  . clarithromycin (BIAXIN) 500 MG tablet Take 1 tablet (500 mg total) by mouth 2 (two) times daily. *do not take simvastatin while on this medication* 14 tablet 0  . diclofenac (VOLTAREN) 75 MG EC tablet TAKE ONE TABLET BY MOUTH TWICE DAILY AS NEEDED FOR PAIN WITH FOOD 60 tablet 11  . diltiazem (TIAZAC) 120 MG 24 hr capsule Take 1 capsule (120 mg total) by mouth daily. 30 capsule 11  . fluconazole (DIFLUCAN) 150 MG tablet Take 1 tablet (150 mg total) by mouth once. 1 tablet 0  . furosemide (LASIX) 80 MG tablet Take 1 tablet (80 mg total) by mouth 2 (two) times daily. 60  tablet 11  . glipiZIDE (GLUCOTROL XL) 5 MG 24 hr tablet TAKE 1 TABLET EVERY DAY 30 tablet 11  . glucose blood test strip Test blood sugar once daily and as needed for DM 250.0 100 each 3  . HYDROcodone-acetaminophen (VICODIN) 5-500 MG per tablet Take 1-2 tablets by mouth every 6 (six) hours as needed for pain. 30 tablet 0  . Lancets (ONETOUCH ULTRASOFT) lancets Test blood sugar once daily and as needed for DM 250.0     . levothyroxine (SYNTHROID, LEVOTHROID) 175 MCG tablet TAKE 1 TABLET BY MOUTH DAILY BEFORE BREAKFAST 30 tablet 5  . loratadine (CLARITIN) 10 MG tablet OTC as directed.     . metFORMIN (GLUCOPHAGE) 1000 MG tablet Take 1 tablet (1,000 mg  total) by mouth 2 (two) times daily with a meal. 60 tablet 11  . mometasone (ELOCON) 0.1 % cream Tiny amount to each ear canal twice weekly as needed.    . mometasone (NASONEX) 50 MCG/ACT nasal spray Place 2 sprays into the nose daily. 17 g 11  . Multiple Vitamin (MULTIVITAMIN) capsule Take 1 capsule by mouth daily.      . mupirocin ointment (BACTROBAN) 2 % APPLY TO AFFECTED AREA TWICE A DAY 22 g 4  . omega-3 acid ethyl esters (LOVAZA) 1 G capsule Take by mouth 2 (two) times daily.    . Omega-3 Fatty Acids (FISH OIL PO) Take 1 tablet by mouth daily.    Marland Kitchen omeprazole (PRILOSEC) 20 MG capsule Take 20 mg by mouth 2 (two) times daily.     . potassium chloride SA (KLOR-CON M20) 20 MEQ tablet TAKE 2 TABS BY MOUTH ONCE DAILY 60 tablet 11  . predniSONE (DELTASONE) 10 MG tablet Take 3 pills once daily by mouth for 3 days, then 2 pills once daily for 3 days, then 1 pill once daily for 3 days and then stop 18 tablet 0  . simvastatin (ZOCOR) 20 MG tablet Take 1 tablet (20 mg total) by mouth at bedtime. 30 tablet 11  . warfarin (COUMADIN) 5 MG tablet Take as directed by the Anticoagulation Clinic 35 tablet 2  . warfarin (COUMADIN) 5 MG tablet TAKE AS DIRECTED BY THE ANTICOAGULATION CLINIC 45 tablet 2   No current facility-administered medications on file prior to visit.      Review of Systems Review of Systems  Constitutional: Negative for fever, appetite change, fatigue and unexpected weight change.  Eyes: Negative for pain and visual disturbance.  Respiratory: Negative for cough and shortness of breath.   Cardiovascular: Negative for cp or palpitations    Gastrointestinal: Negative for nausea, diarrhea and constipation.  Genitourinary: pos  for urgency and frequency. pos for mild L back/flank pain  Skin: Negative for pallor or rash   Neurological: Negative for weakness, light-headedness, numbness and headaches.  Hematological: Negative for adenopathy. Does not bruise/bleed easily.    Psychiatric/Behavioral: Negative for dysphoric mood. The patient is not nervous/anxious.         Objective:   Physical Exam  Constitutional: She appears well-developed and well-nourished. No distress.  Morbidly obese and well app  HENT:  Head: Normocephalic and atraumatic.  Mouth/Throat: Oropharynx is clear and moist.  Eyes: Conjunctivae and EOM are normal. Pupils are equal, round, and reactive to light. No scleral icterus.  Neck: Normal range of motion. Neck supple.  Cardiovascular: Normal rate.   Pulmonary/Chest: Effort normal and breath sounds normal.  Abdominal: Soft. Bowel sounds are normal. She exhibits no distension and no mass. There is tenderness in  the suprapubic area. There is no rebound, no guarding, no CVA tenderness, no tenderness at McBurney's point and negative Murphy's sign.  Musculoskeletal: She exhibits no edema.  Lymphadenopathy:    She has no cervical adenopathy.  Neurological: She is alert.  Skin: Skin is warm and dry. No rash noted. No erythema. No pallor.  Psychiatric: She has a normal mood and affect.          Assessment & Plan:   Problem List Items Addressed This Visit      Cardiovascular and Mediastinum   Atrial fibrillation    INR today Will have to follow more closely while on abx/diflucan also         Genitourinary   UTI (urinary tract infection)    With gross hematuria (pt is on coumadin) INR today Start bactrim DS cx urine  Diflucan for yeast prn Aware - INR will be affected by both meds and we will have to watch more carefully      Relevant Medications   sulfamethoxazole-trimethoprim (BACTRIM DS) tablet 800-160 mg   fluconazole (DIFLUCAN) tablet 150 mg   Other Relevant Orders   Urine culture (Completed)    Other Visit Diagnoses    Blood in urine    -  Primary    Relevant Orders    POCT urinalysis dipstick (Completed)    Urinary frequency        Relevant Orders    POCT urinalysis dipstick (Completed)

## 2014-11-02 NOTE — Assessment & Plan Note (Signed)
With gross hematuria (pt is on coumadin) INR today Start bactrim DS cx urine  Diflucan for yeast prn Aware - INR will be affected by both meds and we will have to watch more carefully

## 2014-11-02 NOTE — Patient Instructions (Signed)
Take the bactrim for presumed uti  Drink lots of water  Take diflucan for yeast infection if needed Both of these medicines can affect coumadin - so we need to watch this carefully  INR today   We will culture your urine and get back to you with a result

## 2014-11-02 NOTE — Progress Notes (Signed)
Pre visit review using our clinic review tool, if applicable. No additional management support is needed unless otherwise documented below in the visit note. 

## 2014-11-03 LAB — URINE CULTURE

## 2014-11-07 NOTE — Assessment & Plan Note (Signed)
INR today Will have to follow more closely while on abx/diflucan also

## 2014-11-08 ENCOUNTER — Ambulatory Visit: Payer: No Typology Code available for payment source

## 2014-11-19 ENCOUNTER — Encounter: Payer: Self-pay | Admitting: Family Medicine

## 2014-11-19 ENCOUNTER — Ambulatory Visit (INDEPENDENT_AMBULATORY_CARE_PROVIDER_SITE_OTHER): Payer: 59 | Admitting: Family Medicine

## 2014-11-19 VITALS — BP 128/72 | HR 83 | Temp 98.3°F

## 2014-11-19 DIAGNOSIS — J209 Acute bronchitis, unspecified: Secondary | ICD-10-CM | POA: Insufficient documentation

## 2014-11-19 MED ORDER — METHYLPREDNISOLONE ACETATE 40 MG/ML IJ SUSP
40.0000 mg | Freq: Once | INTRAMUSCULAR | Status: AC
  Administered 2014-11-19: 40 mg via INTRAMUSCULAR

## 2014-11-19 MED ORDER — LEVOFLOXACIN 500 MG PO TABS: 500.0000 mg | ORAL_TABLET | Freq: Every day | ORAL | Status: DC

## 2014-11-19 MED ORDER — PREDNISONE 10 MG PO TABS: ORAL_TABLET | ORAL | Status: DC

## 2014-11-19 MED ORDER — GLUCOSE BLOOD VI STRP: ORAL_STRIP | Status: AC

## 2014-11-19 MED ORDER — HYDROCOD POLST-CHLORPHEN POLST 10-8 MG/5ML PO LQCR: 5.0000 mL | Freq: Two times a day (BID) | ORAL | Status: DC | PRN

## 2014-11-19 NOTE — Progress Notes (Signed)
Pre visit review using our clinic review tool, if applicable. No additional management support is needed unless otherwise documented below in the visit note. 

## 2014-11-19 NOTE — Assessment & Plan Note (Signed)
With bronchospasm in pt who is 02 dep 4L 02 Nebulizer tx as needed  Depo medrol 40 mg today- followed by prednisone taper (disc side eff incl hyperglycemia-aware sugar will go up and she will watch it)  levaquin 7 d tussionex for cough prn with caution of sedation  Disc symptomatic care - see instructions on AVS  Update if not starting to improve in a week or if worsening

## 2014-11-19 NOTE — Progress Notes (Signed)
Subjective:    Patient ID: Amy Bryant, female    DOB: [birth year], 59 y.o.   MRN: [id number]  HPI Here with uri symptoms  Started earlier this week  Sneezing and coughing  Was exposed to many sick people incl husband   (also exp to a lot of smoke at work)  No otc meds-"nothing works for her" Continues her allergy medicine   Cough is bad- tight and rattling  Chest burns and feels raw  Head is congested - L side of face hurts   No fever   Ears -no change  Throat is ok   Patient Active Problem List   Diagnosis Date Noted  . UTI (urinary tract infection) 11/02/2014  . Acute bronchitis with bronchospasm 02/05/2014  . Colon cancer screening 12/15/2013  . Encounter for therapeutic drug monitoring 11/05/2013  . Routine general medical examination at a health care facility 05/20/2012  . Atrial fibrillation 11/23/2010  . Pulmonary embolus 11/23/2010  . UNSPECIFIED VITAMIN D DEFICIENCY 11/15/2010  . DYSURIA, HX OF 08/19/2009  . PURE HYPERCHOLESTEROLEMIA 08/16/2009  . ATRIAL FIBRILLATION, HX OF 12/03/2008  . DIABETES MELLITUS, MILD 08/13/2008  . HYPERPARATHYROIDISM UNSPECIFIED 07/30/2008  . HYPOTHYROIDISM 02/13/2008  . OBESITY 02/04/2008  . REACTIVE AIRWAY DISEASE 02/04/2008  . GERD 02/04/2008  . DISC DISEASE, LUMBAR 02/04/2008  . PULMONARY EMBOLISM, HX OF 02/04/2008  . TOBACCO USE, QUIT 02/04/2008  . OBESITY HYPOVENTILATION SYNDROME 08/11/2007  . COR PULMONALE 08/11/2007   Past Medical History  Diagnosis Date  . GERD (gastroesophageal reflux disease)   . Pulmonary embolism     hx of on coumadin  . Morbid obesity   . Reactive airways dysfunction syndrome   . Cor pulmonale     and obesity hypoventilation syndrome  . Hypothyroid   . OA (osteoarthritis)   . Atrial fibrillation     with cardioversion success   Past Surgical History  Procedure Laterality Date  . Tonsillectomy     History  Substance Use Topics  . Smoking status: Former Games developer  . Smokeless  tobacco: Not on file  . Alcohol Use: No   Family History  Problem Relation Age of Onset  . Heart disease Father 95    MI  . Diabetes Brother    Allergies  Allergen Reactions  . Augmentin [Amoxicillin-Pot Clavulanate] Other (See Comments)    Rash itching  . Pseudoephedrine     REACTION: tightness in chest   Current Outpatient Prescriptions on File Prior to Visit  Medication Sig Dispense Refill  . albuterol (PROVENTIL) (2.5 MG/3ML) 0.083% nebulizer solution INHALE 1 VAIL VIA NEBULIZER EVERY 4 HOURS AS NEEDED FOR WHEEZE 150 mL 11  . budesonide-formoterol (SYMBICORT) 160-4.5 MCG/ACT inhaler Inhale 2 puffs into the lungs 2 (two) times daily. 1 Inhaler 11  . cholecalciferol (VITAMIN D) 1000 UNITS tablet Take 4,000 Units by mouth daily.    . ciclopirox (LOPROX) 0.77 % cream APPLY TO AFFECTED AREA TWICE A DAY AS NEEDED 90 g 3  . diclofenac (VOLTAREN) 75 MG EC tablet TAKE ONE TABLET BY MOUTH TWICE DAILY AS NEEDED FOR PAIN WITH FOOD 60 tablet 11  . diltiazem (TIAZAC) 120 MG 24 hr capsule Take 1 capsule (120 mg total) by mouth daily. 30 capsule 11  . furosemide (LASIX) 80 MG tablet Take 1 tablet (80 mg total) by mouth 2 (two) times daily. 60 tablet 11  . glipiZIDE (GLUCOTROL XL) 5 MG 24 hr tablet TAKE 1 TABLET EVERY DAY 30 tablet 11  . glucose blood test  strip Test blood sugar once daily and as needed for DM 250.0 100 each 3  . Lancets (ONETOUCH ULTRASOFT) lancets Test blood sugar once daily and as needed for DM 250.0     . levothyroxine (SYNTHROID, LEVOTHROID) 175 MCG tablet TAKE 1 TABLET BY MOUTH DAILY BEFORE BREAKFAST 30 tablet 5  . loratadine (CLARITIN) 10 MG tablet OTC as directed.     . metFORMIN (GLUCOPHAGE) 1000 MG tablet Take 1 tablet (1,000 mg total) by mouth 2 (two) times daily with a meal. 60 tablet 11  . mometasone (ELOCON) 0.1 % cream Tiny amount to each ear canal twice weekly as needed.    . mometasone (NASONEX) 50 MCG/ACT nasal spray Place 2 sprays into the nose daily. 17 g 11    . Multiple Vitamin (MULTIVITAMIN) capsule Take 1 capsule by mouth daily.      . mupirocin ointment (BACTROBAN) 2 % APPLY TO AFFECTED AREA TWICE A DAY 22 g 4  . omega-3 acid ethyl esters (LOVAZA) 1 G capsule Take by mouth 2 (two) times daily.    . Omega-3 Fatty Acids (FISH OIL PO) Take 1 tablet by mouth daily.    Marland Kitchen. omeprazole (PRILOSEC) 20 MG capsule Take 20 mg by mouth 2 (two) times daily.     . potassium chloride SA (KLOR-CON M20) 20 MEQ tablet TAKE 2 TABS BY MOUTH ONCE DAILY 60 tablet 11  . simvastatin (ZOCOR) 20 MG tablet Take 1 tablet (20 mg total) by mouth at bedtime. 30 tablet 11  . warfarin (COUMADIN) 5 MG tablet TAKE AS DIRECTED BY THE ANTICOAGULATION CLINIC 45 tablet 2   No current facility-administered medications on file prior to visit.    Review of Systems Review of Systems  Constitutional: Negative for fever, appetite change,  and unexpected weight change.  ENT pos for cong and rhinorrhea and sinus pressure  Eyes: Negative for pain and visual disturbance.  Respiratory: pos  for cough and shortness of breath.   Cardiovascular: Negative for cp or palpitations    Gastrointestinal: Negative for nausea, diarrhea and constipation.  Genitourinary: Negative for urgency and frequency.  Skin: Negative for pallor or rash   Neurological: Negative for weakness, light-headedness, numbness and headaches.  Hematological: Negative for adenopathy. Does not bruise/bleed easily.  Psychiatric/Behavioral: Negative for dysphoric mood. The patient is not nervous/anxious.         Objective:   Physical Exam  Constitutional: She appears well-developed and well-nourished. No distress.  Obese female in wheelchair-no distress Has 02 by nasal cannula   HENT:  Head: Normocephalic and atraumatic.  Right Ear: External ear normal.  Left Ear: External ear normal.  Mouth/Throat: Oropharynx is clear and moist. No oropharyngeal exudate.  Nares are injected and congested  No sinus tenderness   Eyes:  Conjunctivae and EOM are normal. Pupils are equal, round, and reactive to light. Right eye exhibits no discharge. Left eye exhibits no discharge. No scleral icterus.  Neck: Normal range of motion. Neck supple.  Cardiovascular: Normal rate, regular rhythm and normal heart sounds.   Pulmonary/Chest: Effort normal. No respiratory distress. She has wheezes. She has no rales. She exhibits no tenderness.  Harsh and distant bs Fair air exch-mildly prolonged exp phase  Wheeze on forced exp only  No rales Few scattered rhonchi   Musculoskeletal: She exhibits no edema.  Lymphadenopathy:    She has no cervical adenopathy.  Neurological: She is alert.  Skin: Skin is warm and dry. No rash noted.  Psychiatric: She has a normal mood and  affect.          Assessment & Plan:   Problem List Items Addressed This Visit      Respiratory   Acute bronchitis - Primary    With bronchospasm in pt who is 02 dep 4L 02 Nebulizer tx as needed  Depo medrol 40 mg today- followed by prednisone taper (disc side eff incl hyperglycemia-aware sugar will go up and she will watch it)  levaquin 7 d tussionex for cough prn with caution of sedation  Disc symptomatic care - see instructions on AVS  Update if not starting to improve in a week or if worsening        Relevant Medications   methylPREDNISolone acetate (DEPO-MEDROL) injection 40 mg (Completed)

## 2014-11-19 NOTE — Patient Instructions (Signed)
Steroid shot today Take the prednisone taper as directed -start it tomorrow  Make appt for PT/INR in 1 week since the antibiotic can affect this Take levaquin as directed  Use tussionex with caution of sedation  mucinex is ok over the counter - to help move mucous out of head and chest

## 2014-11-24 ENCOUNTER — Other Ambulatory Visit: Payer: Self-pay | Admitting: Family Medicine

## 2014-11-24 NOTE — Telephone Encounter (Signed)
Please go ahead and refill the levaquin times one - and tell her to f/u if worse or no improvement thanks

## 2014-11-24 NOTE — Telephone Encounter (Addendum)
Called and spoken to patient. She stated that she does not have a fever. Mucous is thick and greenish when she managed to cough it up. She feel like it rattling in her chest. Patient states she doing her breathing treatment but not much better. She thinks she may need another round of antibiotic.

## 2014-11-24 NOTE — Telephone Encounter (Signed)
?   Fever ? What color is mucous? How bad is cough? thanks

## 2014-11-24 NOTE — Telephone Encounter (Signed)
Ok to refill? Patient stated she still have a lot of congestion. Last prescribed and seen on 11/19/14

## 2014-11-25 NOTE — Telephone Encounter (Signed)
Spoken to patient's husband and so he could notified the patient that rx sent to [facility]. Call for f/u if not better.

## 2014-12-08 ENCOUNTER — Telehealth: Payer: Self-pay | Admitting: Family Medicine

## 2014-12-08 DIAGNOSIS — Z0000 Encounter for general adult medical examination without abnormal findings: Secondary | ICD-10-CM

## 2014-12-08 DIAGNOSIS — E213 Hyperparathyroidism, unspecified: Secondary | ICD-10-CM

## 2014-12-08 DIAGNOSIS — E119 Type 2 diabetes mellitus without complications: Secondary | ICD-10-CM

## 2014-12-08 DIAGNOSIS — E559 Vitamin D deficiency, unspecified: Secondary | ICD-10-CM

## 2014-12-08 NOTE — Telephone Encounter (Signed)
-----   Message from Alvina Chouerri J Walsh sent at 12/02/2014  4:15 PM EST ----- Regarding: Lab orders for Thursday,3.10.16 Patient is scheduled for CPX labs, please order future labs, Thanks , Camelia Engerri

## 2014-12-09 ENCOUNTER — Other Ambulatory Visit: Payer: Self-pay | Admitting: Family Medicine

## 2014-12-09 DIAGNOSIS — E215 Disorder of parathyroid gland, unspecified: Secondary | ICD-10-CM

## 2014-12-10 ENCOUNTER — Other Ambulatory Visit (INDEPENDENT_AMBULATORY_CARE_PROVIDER_SITE_OTHER): Payer: 59

## 2014-12-10 DIAGNOSIS — I4891 Unspecified atrial fibrillation: Secondary | ICD-10-CM

## 2014-12-10 DIAGNOSIS — E215 Disorder of parathyroid gland, unspecified: Secondary | ICD-10-CM

## 2014-12-10 DIAGNOSIS — E559 Vitamin D deficiency, unspecified: Secondary | ICD-10-CM

## 2014-12-10 DIAGNOSIS — Z0000 Encounter for general adult medical examination without abnormal findings: Secondary | ICD-10-CM

## 2014-12-10 DIAGNOSIS — E119 Type 2 diabetes mellitus without complications: Secondary | ICD-10-CM

## 2014-12-10 LAB — CBC WITH DIFFERENTIAL/PLATELET
BASOS ABS: 0 10*3/uL (ref 0.0–0.1)
Basophils Relative: 0.4 % (ref 0.0–3.0)
Eosinophils Absolute: 0.2 10*3/uL (ref 0.0–0.7)
Eosinophils Relative: 3.2 % (ref 0.0–5.0)
HEMATOCRIT: 37.5 % (ref 36.0–46.0)
HEMOGLOBIN: 12.4 g/dL (ref 12.0–15.0)
LYMPHS ABS: 1 10*3/uL (ref 0.7–4.0)
Lymphocytes Relative: 21.9 % (ref 12.0–46.0)
MCHC: 33 g/dL (ref 30.0–36.0)
MCV: 95.4 fl (ref 78.0–100.0)
Monocytes Absolute: 0.3 10*3/uL (ref 0.1–1.0)
Monocytes Relative: 6.7 % (ref 3.0–12.0)
NEUTROS ABS: 3.2 10*3/uL (ref 1.4–7.7)
Neutrophils Relative %: 67.8 % (ref 43.0–77.0)
Platelets: 203 10*3/uL (ref 150.0–400.0)
RBC: 3.93 Mil/uL (ref 3.87–5.11)
RDW: 14.2 % (ref 11.5–15.5)
WBC: 4.7 10*3/uL (ref 4.0–10.5)

## 2014-12-10 LAB — LIPID PANEL
CHOL/HDL RATIO: 4
CHOLESTEROL: 146 mg/dL (ref 0–200)
HDL: 36.6 mg/dL — AB (ref >39.00)
LDL Cholesterol: 82 mg/dL (ref 0–99)
NONHDL: 109.4
Triglycerides: 139 mg/dL (ref 0.0–149.0)
VLDL: 27.8 mg/dL (ref 0.0–40.0)

## 2014-12-10 LAB — POCT INR: INR: 2.4

## 2014-12-10 LAB — COMPREHENSIVE METABOLIC PANEL
ALK PHOS: 93 U/L (ref 39–117)
ALT: 25 U/L (ref 0–35)
AST: 14 U/L (ref 0–37)
Albumin: 3.9 g/dL (ref 3.5–5.2)
BUN: 21 mg/dL (ref 6–23)
CO2: 31 mEq/L (ref 19–32)
Calcium: 11.1 mg/dL — ABNORMAL HIGH (ref 8.4–10.5)
Chloride: 103 mEq/L (ref 96–112)
Creatinine, Ser: 0.7 mg/dL (ref 0.40–1.20)
GFR: 90.83 mL/min (ref >60.00)
GLUCOSE: 195 mg/dL — AB (ref 70–99)
Potassium: 4.2 mEq/L (ref 3.5–5.1)
SODIUM: 138 meq/L (ref 135–145)
Total Bilirubin: 0.4 mg/dL (ref 0.2–1.2)
Total Protein: 6.4 g/dL (ref 6.0–8.3)

## 2014-12-10 LAB — CALCIUM: Calcium: 11.1 mg/dL — ABNORMAL HIGH (ref 8.4–10.5)

## 2014-12-10 LAB — TSH: TSH: 2.43 u[IU]/mL (ref 0.35–4.50)

## 2014-12-10 LAB — VITAMIN D 25 HYDROXY (VIT D DEFICIENCY, FRACTURES): VITD: 25.75 ng/mL — AB (ref 30.00–100.00)

## 2014-12-10 LAB — HEMOGLOBIN A1C: HEMOGLOBIN A1C: 7.4 % — AB (ref 4.6–6.5)

## 2014-12-13 ENCOUNTER — Telehealth: Payer: Self-pay

## 2014-12-13 NOTE — Telephone Encounter (Signed)
Pt returned your call. Please call her back at work at [phone] and ask for Debbie.

## 2014-12-13 NOTE — Telephone Encounter (Signed)
Patient advised no change in coumadin dose.

## 2014-12-13 NOTE — Telephone Encounter (Signed)
Left message for patient to call office regarding her coumadin dosing. 

## 2014-12-13 NOTE — Telephone Encounter (Signed)
-----   Message from Judy PimpleMarne A [name], MD sent at 12/12/2014 12:32 PM EDT ----- Will disc with pt at upcoming follow up   No change in coumadin dose- re check in a month

## 2014-12-17 ENCOUNTER — Encounter: Payer: Self-pay | Admitting: Family Medicine

## 2014-12-17 ENCOUNTER — Ambulatory Visit (INDEPENDENT_AMBULATORY_CARE_PROVIDER_SITE_OTHER): Payer: 59 | Admitting: Family Medicine

## 2014-12-17 VITALS — BP 130/74 | HR 72 | Temp 97.8°F | Ht 65.0 in

## 2014-12-17 DIAGNOSIS — E039 Hypothyroidism, unspecified: Secondary | ICD-10-CM

## 2014-12-17 DIAGNOSIS — E559 Vitamin D deficiency, unspecified: Secondary | ICD-10-CM

## 2014-12-17 DIAGNOSIS — E669 Obesity, unspecified: Secondary | ICD-10-CM

## 2014-12-17 DIAGNOSIS — E213 Hyperparathyroidism, unspecified: Secondary | ICD-10-CM

## 2014-12-17 DIAGNOSIS — E7800 Pure hypercholesterolemia, unspecified: Secondary | ICD-10-CM

## 2014-12-17 DIAGNOSIS — E119 Type 2 diabetes mellitus without complications: Secondary | ICD-10-CM

## 2014-12-17 MED ORDER — LEVOTHYROXINE SODIUM 175 MCG PO TABS
ORAL_TABLET | ORAL | Status: DC
Start: 2014-12-17 — End: 2015-12-12

## 2014-12-17 MED ORDER — POTASSIUM CHLORIDE CRYS ER 20 MEQ PO TBCR: EXTENDED_RELEASE_TABLET | ORAL | Status: DC

## 2014-12-17 NOTE — Progress Notes (Signed)
Subjective:    Patient ID: Amy Bryant, female    DOB: April 02, 1956, 59 y.o.   MRN: [id number]  HPI Here for f/u of chronic health problems   Could not weigh today    Diabetes Home sugar results - running about 160 in the evening  Was quite high duing the prednisone  DM diet -is about the same  Exercise - is limited (she does walk when working) - gets sob easily (wears 02) cor pulmonale  Symptoms A1C last  Lab Results  Component Value Date   HGBA1C 7.4* 12/10/2014   Last time 6.8 - but was on prednisone for a while  No problems with medications  Renal protection Last eye exam - has nto been able to afford    Hypothyroidism  Pt has no clinical changes No change in energy level/ hair or skin/ edema and no tremor Lab Results  Component Value Date   TSH 2.43 12/10/2014    Hyperparathyroid Lab Results  Component Value Date   CALCIUM 11.1* 12/10/2014   CALCIUM 11.1* 12/10/2014   PHOS 1.9* 06/11/2014   this is stable     Chemistry      Component Value Date/Time   NA 138 12/10/2014 0804   K 4.2 12/10/2014 0804   CL 103 12/10/2014 0804   CO2 31 12/10/2014 0804   BUN 21 12/10/2014 0804   CREATININE 0.70 12/10/2014 0804      Component Value Date/Time   CALCIUM 11.1* 12/10/2014 0804   CALCIUM 11.1* 12/10/2014 0804   CALCIUM 11.7* 11/02/2010 2223   ALKPHOS 93 12/10/2014 0804   AST 14 12/10/2014 0804   ALT 25 12/10/2014 0804   BILITOT 0.4 12/10/2014 0804       D level is low at 25-she takes 4000 iu daily     choelsterol stable Lab Results  Component Value Date   CHOL 146 12/10/2014   CHOL 146 12/08/2013   CHOL 132 06/04/2013   Lab Results  Component Value Date   HDL 36.60* 12/10/2014   HDL 34.10* 12/08/2013   HDL 30.10* 06/04/2013   Lab Results  Component Value Date   LDLCALC 82 12/10/2014   LDLCALC 74 12/08/2013   LDLCALC 62 06/04/2013   Lab Results  Component Value Date   TRIG 139.0 12/10/2014   TRIG 191.0* 12/08/2013   TRIG 199.0*  06/04/2013   Lab Results  Component Value Date   CHOLHDL 4 12/10/2014   CHOLHDL 4 12/08/2013   CHOLHDL 4 06/04/2013   Lab Results  Component Value Date   LDLDIRECT 90.7 11/28/2012   LDLDIRECT 95.7 11/22/2011   LDLDIRECT 157.6 08/16/2009    Overall stable on that  Still taking the simvastatin    Patient Active Problem List   Diagnosis Date Noted  . Colon cancer screening 12/15/2013  . Encounter for therapeutic drug monitoring 11/05/2013  . Routine general medical examination at a health care facility 05/20/2012  . Atrial fibrillation 11/23/2010  . Pulmonary embolus 11/23/2010  . Vitamin D deficiency 11/15/2010  . DYSURIA, HX OF 08/19/2009  . PURE HYPERCHOLESTEROLEMIA 08/16/2009  . ATRIAL FIBRILLATION, HX OF 12/03/2008  . Diabetes type 2, controlled 08/13/2008  . Hyperparathyroidism 07/30/2008  . Hypothyroidism 02/13/2008  . Obesity 02/04/2008  . REACTIVE AIRWAY DISEASE 02/04/2008  . GERD 02/04/2008  . DISC DISEASE, LUMBAR 02/04/2008  . PULMONARY EMBOLISM, HX OF 02/04/2008  . TOBACCO USE, QUIT 02/04/2008  . OBESITY HYPOVENTILATION SYNDROME 08/11/2007  . COR PULMONALE 08/11/2007   Past Medical History  Diagnosis Date  . GERD (gastroesophageal reflux disease)   . Pulmonary embolism     hx of on coumadin  . Morbid obesity   . Reactive airways dysfunction syndrome   . Cor pulmonale     and obesity hypoventilation syndrome  . Hypothyroid   . OA (osteoarthritis)   . Atrial fibrillation     with cardioversion success   Past Surgical History  Procedure Laterality Date  . Tonsillectomy     History  Substance Use Topics  . Smoking status: Former Games developer  . Smokeless tobacco: Not on file  . Alcohol Use: No   Family History  Problem Relation Age of Onset  . Heart disease Father 5    MI  . Diabetes Brother    Allergies  Allergen Reactions  . Augmentin [Amoxicillin-Pot Clavulanate] Other (See Comments)    Rash itching  . Pseudoephedrine     REACTION:  tightness in chest   Current Outpatient Prescriptions on File Prior to Visit  Medication Sig Dispense Refill  . albuterol (PROVENTIL) (2.5 MG/3ML) 0.083% nebulizer solution INHALE 1 VAIL VIA NEBULIZER EVERY 4 HOURS AS NEEDED FOR WHEEZE 150 mL 11  . budesonide-formoterol (SYMBICORT) 160-4.5 MCG/ACT inhaler Inhale 2 puffs into the lungs 2 (two) times daily. 1 Inhaler 11  . chlorpheniramine-HYDROcodone (TUSSIONEX PENNKINETIC ER) 10-8 MG/5ML LQCR Take 5 mLs by mouth every 12 (twelve) hours as needed. 140 mL 0  . cholecalciferol (VITAMIN D) 1000 UNITS tablet Take 4,000 Units by mouth daily.    . ciclopirox (LOPROX) 0.77 % cream APPLY TO AFFECTED AREA TWICE A DAY AS NEEDED 90 g 3  . diclofenac (VOLTAREN) 75 MG EC tablet TAKE ONE TABLET BY MOUTH TWICE DAILY AS NEEDED FOR PAIN WITH FOOD 60 tablet 11  . diltiazem (TIAZAC) 120 MG 24 hr capsule Take 1 capsule (120 mg total) by mouth daily. 30 capsule 11  . furosemide (LASIX) 80 MG tablet Take 1 tablet (80 mg total) by mouth 2 (two) times daily. 60 tablet 11  . glipiZIDE (GLUCOTROL XL) 5 MG 24 hr tablet TAKE 1 TABLET EVERY DAY 30 tablet 11  . glucose blood test strip Test blood sugar once daily and as needed for DM 250.0 100 each 3  . Lancets (ONETOUCH ULTRASOFT) lancets Test blood sugar once daily and as needed for DM 250.0     . loratadine (CLARITIN) 10 MG tablet OTC as directed.     . metFORMIN (GLUCOPHAGE) 1000 MG tablet Take 1 tablet (1,000 mg total) by mouth 2 (two) times daily with a meal. 60 tablet 11  . mometasone (ELOCON) 0.1 % cream Tiny amount to each ear canal twice weekly as needed.    . mometasone (NASONEX) 50 MCG/ACT nasal spray Place 2 sprays into the nose daily. 17 g 11  . Multiple Vitamin (MULTIVITAMIN) capsule Take 1 capsule by mouth daily.      . mupirocin ointment (BACTROBAN) 2 % APPLY TO AFFECTED AREA TWICE A DAY 22 g 4  . omega-3 acid ethyl esters (LOVAZA) 1 G capsule Take by mouth 2 (two) times daily.    . Omega-3 Fatty Acids (FISH  OIL PO) Take 1 tablet by mouth daily.    Marland Kitchen omeprazole (PRILOSEC) 20 MG capsule Take 20 mg by mouth 2 (two) times daily.     . simvastatin (ZOCOR) 20 MG tablet Take 1 tablet (20 mg total) by mouth at bedtime. 30 tablet 11  . warfarin (COUMADIN) 5 MG tablet TAKE AS DIRECTED BY THE ANTICOAGULATION CLINIC 45  tablet 2   No current facility-administered medications on file prior to visit.     Review of Systems Review of Systems  Constitutional: Negative for fever, appetite change,  and unexpected weight change.  Eyes: Negative for pain and visual disturbance.  Respiratory: Negative for cough and pos for baseline sob (is back to baseline on 02)   Cardiovascular: Negative for cp or palpitations    Gastrointestinal: Negative for nausea, diarrhea and constipation.  Genitourinary: Negative for urgency and frequency.  Skin: Negative for pallor or rash   Neurological: Negative for weakness, light-headedness, numbness and headaches.  Hematological: Negative for adenopathy. Does not bruise/bleed easily.  Psychiatric/Behavioral: Negative for dysphoric mood. The patient is not nervous/anxious.         Objective:   Physical Exam  Constitutional: She appears well-developed and well-nourished. No distress.  mobidly obese in wheelchair and well app  HENT:  Head: Normocephalic and atraumatic.  Mouth/Throat: Oropharynx is clear and moist.  Eyes: Conjunctivae and EOM are normal. Pupils are equal, round, and reactive to light. No scleral icterus.  Neck: Normal range of motion. Neck supple. No JVD present. Carotid bruit is not present. No thyromegaly present.  Cardiovascular: Normal rate, regular rhythm, normal heart sounds and intact distal pulses.  Exam reveals no gallop.   Pulmonary/Chest: Effort normal and breath sounds normal. No respiratory distress. She has no wheezes. She has no rales.  Diffusely distant bs No wheeze or crackles  Fair air exch   Abdominal: Soft. Bowel sounds are normal. She  exhibits no distension and no mass. There is no tenderness.  Musculoskeletal: She exhibits no edema or tenderness.  Baseline puffiness without pitting edema   Lymphadenopathy:    She has no cervical adenopathy.  Neurological: She is alert. She has normal reflexes. No cranial nerve deficit. She exhibits normal muscle tone. Coordination normal.  Skin: Skin is warm and dry. No rash noted. No erythema. No pallor.  Psychiatric: She has a normal mood and affect.          Assessment & Plan:   Problem List Items Addressed This Visit      Endocrine   Diabetes type 2, controlled    Due to recent prednisone, A1C is up  Pt states her glucose readings are starting to come back to nl  Rev low glycemic diet  Limited exercise due to pulmonary problems  Morbid obesity- would greatly benefit from weight loss       Hyperparathyroidism    This is stable Lab Results  Component Value Date   CALCIUM 11.1* 12/10/2014   CALCIUM 11.1* 12/10/2014   PHOS 1.9* 06/11/2014   She cannot afford dexa or specialist referral at this time Will continue to monitor  Disc imp of D supplement- level is still low, will inc to 5000 iu daily       Hypothyroidism - Primary    Hypothyroidism  Pt has no clinical changes No change in energy level/ hair or skin/ edema and no tremor Lab Results  Component Value Date   TSH 2.43 12/10/2014           Relevant Medications   levothyroxine (SYNTHROID, LEVOTHROID) tablet     Other   Obesity    Discussed how this problem influences overall health and the risks it imposes  Reviewed plan for weight loss with lower calorie diet (via better food choices and also portion control or program like weight watchers) and exercise building up to or more than 30 minutes 5 days  per week including some aerobic activity         PURE HYPERCHOLESTEROLEMIA    Disc goals for lipids and reasons to control them Rev labs with pt Rev low sat fat diet in detail Stable on simvastatin  and diet       Vitamin D deficiency    D level still in 20s Disc imp to bone and general health  Will inc daily D3 dose to 5000 iu  Continue to follow

## 2014-12-17 NOTE — Progress Notes (Signed)
Pre visit review using our clinic review tool, if applicable. No additional management support is needed unless otherwise documented below in the visit note. 

## 2014-12-17 NOTE — Patient Instructions (Signed)
Increase your vitamin D to 5000 iu daily  (get outdoors when you can)  Keep working on diet and exercise as much as you can  Get an eye exam when you are able  Schedule non fasting labs in 3 months for A1C

## 2014-12-19 NOTE — Assessment & Plan Note (Signed)
Hypothyroidism  Pt has no clinical changes No change in energy level/ hair or skin/ edema and no tremor Lab Results  Component Value Date   TSH 2.43 12/10/2014

## 2014-12-19 NOTE — Assessment & Plan Note (Signed)
Due to recent prednisone, A1C is up  Pt states her glucose readings are starting to come back to nl  Rev low glycemic diet  Limited exercise due to pulmonary problems  Morbid obesity- would greatly benefit from weight loss

## 2014-12-19 NOTE — Assessment & Plan Note (Signed)
Disc goals for lipids and reasons to control them Rev labs with pt Rev low sat fat diet in detail Stable on simvastatin and diet

## 2014-12-19 NOTE — Assessment & Plan Note (Signed)
Discussed how this problem influences overall health and the risks it imposes  Reviewed plan for weight loss with lower calorie diet (via better food choices and also portion control or program like weight watchers) and exercise building up to or more than 30 minutes 5 days per week including some aerobic activity    

## 2014-12-19 NOTE — Assessment & Plan Note (Signed)
D level still in 20s Disc imp to bone and general health  Will inc daily D3 dose to 5000 iu  Continue to follow

## 2014-12-19 NOTE — Assessment & Plan Note (Signed)
This is stable Lab Results  Component Value Date   CALCIUM 11.1* 12/10/2014   CALCIUM 11.1* 12/10/2014   PHOS 1.9* 06/11/2014   She cannot afford dexa or specialist referral at this time Will continue to monitor  Disc imp of D supplement- level is still low, will inc to 5000 iu daily

## 2015-01-10 ENCOUNTER — Telehealth: Payer: Self-pay

## 2015-01-10 NOTE — Telephone Encounter (Signed)
Left message informing patient that we received notice that her insurance does cover eye exams.

## 2015-03-02 ENCOUNTER — Other Ambulatory Visit: Payer: Self-pay | Admitting: Family Medicine

## 2015-03-04 NOTE — Telephone Encounter (Signed)
Please refill for 6 mo and make sure she is utd on INR checks Thanks

## 2015-03-04 NOTE — Telephone Encounter (Signed)
Med refilled and pt is coming in for labs on 6/14 so we can check her INR then

## 2015-03-04 NOTE — Telephone Encounter (Signed)
Electronic refill request, please advise  

## 2015-03-15 ENCOUNTER — Other Ambulatory Visit: Payer: 59

## 2015-03-15 ENCOUNTER — Other Ambulatory Visit (INDEPENDENT_AMBULATORY_CARE_PROVIDER_SITE_OTHER): Payer: 59

## 2015-03-15 DIAGNOSIS — E119 Type 2 diabetes mellitus without complications: Secondary | ICD-10-CM | POA: Diagnosis not present

## 2015-03-15 DIAGNOSIS — I2699 Other pulmonary embolism without acute cor pulmonale: Secondary | ICD-10-CM

## 2015-03-15 LAB — POCT INR: INR: 1.8

## 2015-03-15 LAB — HEMOGLOBIN A1C: Hgb A1c MFr Bld: 6.8 % — ABNORMAL HIGH (ref 4.6–6.5)

## 2015-03-16 ENCOUNTER — Encounter: Payer: Self-pay | Admitting: *Deleted

## 2015-04-21 ENCOUNTER — Ambulatory Visit: Payer: 59

## 2015-05-05 ENCOUNTER — Ambulatory Visit: Payer: 59

## 2015-05-18 ENCOUNTER — Other Ambulatory Visit: Payer: Self-pay | Admitting: Family Medicine

## 2015-05-19 ENCOUNTER — Ambulatory Visit (INDEPENDENT_AMBULATORY_CARE_PROVIDER_SITE_OTHER): Payer: 59 | Admitting: *Deleted

## 2015-05-19 DIAGNOSIS — Z5181 Encounter for therapeutic drug level monitoring: Secondary | ICD-10-CM | POA: Diagnosis not present

## 2015-05-19 DIAGNOSIS — I2699 Other pulmonary embolism without acute cor pulmonale: Secondary | ICD-10-CM

## 2015-05-19 DIAGNOSIS — Z86718 Personal history of other venous thrombosis and embolism: Secondary | ICD-10-CM

## 2015-05-19 LAB — POCT INR: INR: 2.6

## 2015-05-19 NOTE — Progress Notes (Signed)
Pre visit review using our clinic review tool, if applicable. No additional management support is needed unless otherwise documented below in the visit note. 

## 2015-06-06 ENCOUNTER — Other Ambulatory Visit: Payer: Self-pay | Admitting: Family Medicine

## 2015-06-30 ENCOUNTER — Ambulatory Visit: Payer: 59

## 2015-07-05 ENCOUNTER — Telehealth: Payer: Self-pay | Admitting: Family Medicine

## 2015-07-05 DIAGNOSIS — E559 Vitamin D deficiency, unspecified: Secondary | ICD-10-CM

## 2015-07-05 DIAGNOSIS — Z0000 Encounter for general adult medical examination without abnormal findings: Secondary | ICD-10-CM

## 2015-07-05 DIAGNOSIS — E213 Hyperparathyroidism, unspecified: Secondary | ICD-10-CM

## 2015-07-05 DIAGNOSIS — E119 Type 2 diabetes mellitus without complications: Secondary | ICD-10-CM

## 2015-07-05 NOTE — Telephone Encounter (Signed)
-----   Message from Alvina Chou sent at 06/30/2015  9:54 AM EDT ----- Regarding: Lab orders for Wednesday, 10.5.16 Patient is scheduled for CPX labs, please order future labs, Thanks , Camelia Eng

## 2015-07-06 ENCOUNTER — Other Ambulatory Visit (INDEPENDENT_AMBULATORY_CARE_PROVIDER_SITE_OTHER): Payer: 59

## 2015-07-06 DIAGNOSIS — E559 Vitamin D deficiency, unspecified: Secondary | ICD-10-CM

## 2015-07-06 DIAGNOSIS — Z0000 Encounter for general adult medical examination without abnormal findings: Secondary | ICD-10-CM

## 2015-07-06 DIAGNOSIS — E119 Type 2 diabetes mellitus without complications: Secondary | ICD-10-CM | POA: Diagnosis not present

## 2015-07-06 LAB — CBC WITH DIFFERENTIAL/PLATELET
BASOS ABS: 0 10*3/uL (ref 0.0–0.1)
Basophils Relative: 0.5 % (ref 0.0–3.0)
EOS ABS: 0.3 10*3/uL (ref 0.0–0.7)
Eosinophils Relative: 4.9 % (ref 0.0–5.0)
HCT: 38.5 % (ref 36.0–46.0)
Hemoglobin: 12.7 g/dL (ref 12.0–15.0)
LYMPHS ABS: 1 10*3/uL (ref 0.7–4.0)
LYMPHS PCT: 17.1 % (ref 12.0–46.0)
MCHC: 33 g/dL (ref 30.0–36.0)
MCV: 95 fl (ref 78.0–100.0)
MONOS PCT: 7.1 % (ref 3.0–12.0)
Monocytes Absolute: 0.4 10*3/uL (ref 0.1–1.0)
NEUTROS PCT: 70.4 % (ref 43.0–77.0)
Neutro Abs: 4 10*3/uL (ref 1.4–7.7)
PLATELETS: 213 10*3/uL (ref 150.0–400.0)
RBC: 4.05 Mil/uL (ref 3.87–5.11)
RDW: 14.4 % (ref 11.5–15.5)
WBC: 5.7 10*3/uL (ref 4.0–10.5)

## 2015-07-06 LAB — LIPID PANEL
CHOL/HDL RATIO: 4
CHOLESTEROL: 152 mg/dL (ref 0–200)
HDL: 34.7 mg/dL — ABNORMAL LOW (ref >39.00)
LDL Cholesterol: 89 mg/dL (ref 0–99)
NonHDL: 117.44
TRIGLYCERIDES: 140 mg/dL (ref 0.0–149.0)
VLDL: 28 mg/dL (ref 0.0–40.0)

## 2015-07-06 LAB — COMPREHENSIVE METABOLIC PANEL
ALBUMIN: 4 g/dL (ref 3.5–5.2)
ALK PHOS: 109 U/L (ref 39–117)
ALT: 30 U/L (ref 0–35)
AST: 20 U/L (ref 0–37)
BILIRUBIN TOTAL: 0.4 mg/dL (ref 0.2–1.2)
BUN: 20 mg/dL (ref 6–23)
CALCIUM: 11.5 mg/dL — AB (ref 8.4–10.5)
CO2: 32 mEq/L (ref 19–32)
Chloride: 103 mEq/L (ref 96–112)
Creatinine, Ser: 0.77 mg/dL (ref 0.40–1.20)
GFR: 81.21 mL/min (ref >60.00)
Glucose, Bld: 174 mg/dL — ABNORMAL HIGH (ref 70–99)
Potassium: 4.3 mEq/L (ref 3.5–5.1)
Sodium: 140 mEq/L (ref 135–145)
TOTAL PROTEIN: 6.8 g/dL (ref 6.0–8.3)

## 2015-07-06 LAB — TSH: TSH: 4.47 u[IU]/mL (ref 0.35–4.50)

## 2015-07-06 LAB — VITAMIN D 25 HYDROXY (VIT D DEFICIENCY, FRACTURES): VITD: 27.65 ng/mL — AB (ref 30.00–100.00)

## 2015-07-06 LAB — HEMOGLOBIN A1C: HEMOGLOBIN A1C: 7.1 % — AB (ref 4.6–6.5)

## 2015-07-11 ENCOUNTER — Ambulatory Visit (INDEPENDENT_AMBULATORY_CARE_PROVIDER_SITE_OTHER): Payer: 59 | Admitting: *Deleted

## 2015-07-11 ENCOUNTER — Ambulatory Visit (INDEPENDENT_AMBULATORY_CARE_PROVIDER_SITE_OTHER): Payer: 59 | Admitting: Family Medicine

## 2015-07-11 ENCOUNTER — Encounter: Payer: Self-pay | Admitting: Family Medicine

## 2015-07-11 VITALS — BP 128/60 | HR 72 | Temp 98.2°F | Ht 65.0 in

## 2015-07-11 DIAGNOSIS — E039 Hypothyroidism, unspecified: Secondary | ICD-10-CM | POA: Diagnosis not present

## 2015-07-11 DIAGNOSIS — E119 Type 2 diabetes mellitus without complications: Secondary | ICD-10-CM

## 2015-07-11 DIAGNOSIS — E559 Vitamin D deficiency, unspecified: Secondary | ICD-10-CM

## 2015-07-11 DIAGNOSIS — I2699 Other pulmonary embolism without acute cor pulmonale: Secondary | ICD-10-CM

## 2015-07-11 DIAGNOSIS — Z5181 Encounter for therapeutic drug level monitoring: Secondary | ICD-10-CM

## 2015-07-11 DIAGNOSIS — E213 Hyperparathyroidism, unspecified: Secondary | ICD-10-CM

## 2015-07-11 DIAGNOSIS — E7800 Pure hypercholesterolemia, unspecified: Secondary | ICD-10-CM

## 2015-07-11 DIAGNOSIS — Z23 Encounter for immunization: Secondary | ICD-10-CM

## 2015-07-11 DIAGNOSIS — Z86718 Personal history of other venous thrombosis and embolism: Secondary | ICD-10-CM

## 2015-07-11 DIAGNOSIS — I4891 Unspecified atrial fibrillation: Secondary | ICD-10-CM

## 2015-07-11 LAB — POCT INR: INR: 1.9

## 2015-07-11 MED ORDER — METFORMIN HCL 1000 MG PO TABS: ORAL_TABLET | ORAL | Status: DC

## 2015-07-11 MED ORDER — GLIPIZIDE ER 5 MG PO TB24: 5.0000 mg | ORAL_TABLET | Freq: Every day | ORAL | Status: DC

## 2015-07-11 MED ORDER — WARFARIN SODIUM 5 MG PO TABS: ORAL_TABLET | ORAL | Status: DC

## 2015-07-11 MED ORDER — ERGOCALCIFEROL 1.25 MG (50000 UT) PO CAPS: 50000.0000 [IU] | ORAL_CAPSULE | ORAL | Status: DC

## 2015-07-11 MED ORDER — DILTIAZEM HCL ER BEADS 120 MG PO CP24: 120.0000 mg | ORAL_CAPSULE | Freq: Every day | ORAL | Status: DC

## 2015-07-11 MED ORDER — SIMVASTATIN 20 MG PO TABS: 20.0000 mg | ORAL_TABLET | Freq: Every day | ORAL | Status: DC

## 2015-07-11 MED ORDER — FUROSEMIDE 80 MG PO TABS: 80.0000 mg | ORAL_TABLET | Freq: Two times a day (BID) | ORAL | Status: DC

## 2015-07-11 MED ORDER — POTASSIUM CHLORIDE CRYS ER 20 MEQ PO TBCR: EXTENDED_RELEASE_TABLET | ORAL | Status: DC

## 2015-07-11 MED ORDER — DICLOFENAC SODIUM 75 MG PO TBEC: DELAYED_RELEASE_TABLET | ORAL | Status: DC

## 2015-07-11 NOTE — Assessment & Plan Note (Signed)
Hypothyroidism  Pt has no clinical changes No change in energy level/ hair or skin/ edema and no tremor Lab Results  Component Value Date   TSH 4.47 07/06/2015    No change in levothyroxine dose Refilled

## 2015-07-11 NOTE — Assessment & Plan Note (Signed)
With elevated PTH in the past and pt continues to decline endocrinology or surgical consult or further eval due to financial limitations Ca is fairly stable  D is low - disc imp of this for prev of OP (pt can also not afford bone density testing) Will tx D def as aggressively as we can and continue to monitor
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
## 2015-07-11 NOTE — Progress Notes (Signed)
Pre visit review using our clinic review tool, if applicable. No additional management support is needed unless otherwise documented below in the visit note. 

## 2015-07-11 NOTE — Assessment & Plan Note (Signed)
Disc goals for lipids and reasons to control them Rev labs with pt Rev low sat fat diet in detail  Continue simvastatin and diet  

## 2015-07-11 NOTE — Assessment & Plan Note (Signed)
Discussed how this problem influences overall health and the risks it imposes  Reviewed plan for weight loss with lower calorie diet (via better food choices and also portion control or program like weight watchers) and exercise building up to or more than 30 minutes 5 days per week including some aerobic activity   Pt is mobility impaired - due to size and resp issues unfortunately

## 2015-07-11 NOTE — Assessment & Plan Note (Signed)
Lab Results  Component Value Date   HGBA1C 7.1* 07/06/2015   This is up  Pt confesses to eating too many carbs  Has struggled to loose wt but is extremely limited in mobility due to size and other health problems  ? If motivated to change diet  States she will work on that  Continue current medicines  Unfortunately cannot afford screening eye exam

## 2015-07-11 NOTE — Patient Instructions (Addendum)
Flu shot today  Get the px vit D and take 1 weekly for 12 weeks In addition get an extra 4000 iu of vit D3 over the counter daily and keep taking that  Use your nasonex or nasacort if you can  Flu shot today  Get back to diabetic eating as much as you can and try to loose weight   Follow up in 3 months with labs prior

## 2015-07-11 NOTE — Assessment & Plan Note (Signed)
Will start ergocalciferol high dose tx 50,000 iu weekly for 12 weeks Also begin and continue 4000 iu D3 daily   Disc imp to bone and overall health

## 2015-07-28 ENCOUNTER — Other Ambulatory Visit: Payer: Self-pay | Admitting: Family Medicine

## 2015-08-04 ENCOUNTER — Ambulatory Visit: Payer: 59

## 2015-08-09 ENCOUNTER — Encounter: Payer: Self-pay | Admitting: *Deleted

## 2015-08-11 ENCOUNTER — Ambulatory Visit: Payer: 59

## 2015-08-29 ENCOUNTER — Ambulatory Visit: Payer: 59 | Admitting: Internal Medicine

## 2015-08-29 ENCOUNTER — Ambulatory Visit (INDEPENDENT_AMBULATORY_CARE_PROVIDER_SITE_OTHER): Payer: 59 | Admitting: Internal Medicine

## 2015-08-29 ENCOUNTER — Ambulatory Visit (INDEPENDENT_AMBULATORY_CARE_PROVIDER_SITE_OTHER): Payer: 59 | Admitting: *Deleted

## 2015-08-29 ENCOUNTER — Encounter: Payer: Self-pay | Admitting: Internal Medicine

## 2015-08-29 VITALS — BP 124/78 | HR 71 | Temp 98.1°F

## 2015-08-29 DIAGNOSIS — B379 Candidiasis, unspecified: Secondary | ICD-10-CM | POA: Diagnosis not present

## 2015-08-29 DIAGNOSIS — J0100 Acute maxillary sinusitis, unspecified: Secondary | ICD-10-CM

## 2015-08-29 DIAGNOSIS — T3695XA Adverse effect of unspecified systemic antibiotic, initial encounter: Secondary | ICD-10-CM

## 2015-08-29 DIAGNOSIS — Z5181 Encounter for therapeutic drug level monitoring: Secondary | ICD-10-CM

## 2015-08-29 DIAGNOSIS — I2699 Other pulmonary embolism without acute cor pulmonale: Secondary | ICD-10-CM

## 2015-08-29 DIAGNOSIS — I4891 Unspecified atrial fibrillation: Secondary | ICD-10-CM

## 2015-08-29 DIAGNOSIS — Z86718 Personal history of other venous thrombosis and embolism: Secondary | ICD-10-CM

## 2015-08-29 LAB — POCT INR: INR: 2.2

## 2015-08-29 MED ORDER — HYDROCODONE-HOMATROPINE 5-1.5 MG/5ML PO SYRP: 5.0000 mL | ORAL_SOLUTION | Freq: Three times a day (TID) | ORAL | Status: DC | PRN

## 2015-08-29 MED ORDER — WARFARIN SODIUM 5 MG PO TABS: ORAL_TABLET | ORAL | Status: DC

## 2015-08-29 MED ORDER — DOXYCYCLINE HYCLATE 100 MG PO TABS: 100.0000 mg | ORAL_TABLET | Freq: Two times a day (BID) | ORAL | Status: DC

## 2015-08-29 MED ORDER — FLUCONAZOLE 150 MG PO TABS: 150.0000 mg | ORAL_TABLET | Freq: Once | ORAL | Status: DC

## 2015-08-29 NOTE — Progress Notes (Signed)
Pre visit review using our clinic review tool, if applicable. No additional management support is needed unless otherwise documented below in the visit note. 

## 2015-08-29 NOTE — Patient Instructions (Signed)

## 2015-08-29 NOTE — Progress Notes (Signed)
HPI  Pt presents to the clinic today with c/o facial pain and pressure, nasal congestion and sore throat. This started 2-3 days ago. She is blowing yellow mucous out of her nose. She denies fever, chills or body aches. She has not tried anything OTC because everything interacts with her prescriptions. She has no history of allergies. She has had a PE and Cor Pulmonale. She has not had sick contacts that she is aware of.  Review of Systems    Past Medical History  Diagnosis Date  . GERD (gastroesophageal reflux disease)   . Pulmonary embolism (HCC)     hx of on coumadin  . Morbid obesity (HCC)   . Reactive airways dysfunction syndrome   . Cor pulmonale (HCC)     and obesity hypoventilation syndrome  . Hypothyroid   . OA (osteoarthritis)   . Atrial fibrillation (HCC)     with cardioversion success    Family History  Problem Relation Age of Onset  . Heart disease Father 1751    MI  . Diabetes Brother     Social History   Social History  . Marital Status: Married    Spouse Name: N/A  . Number of Children: N/A  . Years of Education: N/A   Occupational History  . Not on file.   Social History Main Topics  . Smoking status: Former Games developermoker  . Smokeless tobacco: Not on file  . Alcohol Use: No  . Drug Use: No  . Sexual Activity: Not on file   Other Topics Concern  . Not on file   Social History Narrative    Allergies  Allergen Reactions  . Augmentin [Amoxicillin-Pot Clavulanate] Other (See Comments)    Rash itching  . Pseudoephedrine     REACTION: tightness in chest     Constitutional: Positive headache, fatigue and fever. Denies abrupt weight changes.  HEENT:  Positive eye pain, facial pain, nasal congestion and sore throat. Denies eye redness, ear pain, ringing in the ears, wax buildup, runny nose or bloody nose. Respiratory: Positive cough. Denies difficulty breathing or shortness of breath.  Cardiovascular: Denies chest pain, chest tightness, palpitations or  swelling in the hands or feet.   No other specific complaints in a complete review of systems (except as listed in HPI above).  Objective:   BP 124/78 mmHg  Pulse 71  Temp(Src) 98.1 F (36.7 C) (Oral)  Wt   SpO2 98%  General: Appears her stated age, obese in NAD. HEENT: Head: normal shape and size, maxillary sinus tenderness noted; Eyes: sclera white, no icterus, conjunctiva pink; Ears: Tm's gray and intact, normal light reflex; Throat/Mouth: + PND. Teeth present, mucosa erythematous and moist, no exudate noted, no lesions or ulcerations noted.  Neck:  No masses, lumps or thyromegaly present.  Cardiovascular: Normal rate and rhythm. S1,S2 noted.  ? Murmur.  Pulmonary/Chest: Normal effort and diminshed vesicular breath sounds. No respiratory distress. No wheezes, rales or ronchi noted.      Assessment & Plan:   Acute bacterial sinusitis  Can use a Neti Pot which can be purchased from your local drug store. Flonase 2 sprays each nostril for 3 days and then as needed. Doxycycline BID for 10 days RX for Hycodan for cough eRx for Diflucan for antibiotic induced yeast infection  RTC as needed or if symptoms persist.

## 2015-09-07 ENCOUNTER — Other Ambulatory Visit: Payer: Self-pay | Admitting: Family Medicine

## 2015-10-11 ENCOUNTER — Telehealth: Payer: Self-pay | Admitting: Family Medicine

## 2015-10-11 DIAGNOSIS — E559 Vitamin D deficiency, unspecified: Secondary | ICD-10-CM

## 2015-10-11 DIAGNOSIS — E119 Type 2 diabetes mellitus without complications: Secondary | ICD-10-CM

## 2015-10-11 NOTE — Telephone Encounter (Signed)
-----   Message from Alvina Chouerri J Walsh sent at 10/04/2015 10:55 AM EST ----- Regarding: Lab orders fro Wednesday, 11.11.17 Lab orders, no f/u appt

## 2015-10-12 ENCOUNTER — Other Ambulatory Visit: Payer: 59

## 2015-10-18 ENCOUNTER — Other Ambulatory Visit: Payer: Self-pay | Admitting: Family Medicine

## 2015-10-19 ENCOUNTER — Other Ambulatory Visit: Payer: Self-pay | Admitting: Family Medicine

## 2015-10-20 ENCOUNTER — Other Ambulatory Visit: Payer: Self-pay | Admitting: Family Medicine

## 2015-10-20 ENCOUNTER — Ambulatory Visit (INDEPENDENT_AMBULATORY_CARE_PROVIDER_SITE_OTHER): Payer: 59 | Admitting: *Deleted

## 2015-10-20 DIAGNOSIS — I2699 Other pulmonary embolism without acute cor pulmonale: Secondary | ICD-10-CM

## 2015-10-20 DIAGNOSIS — I4891 Unspecified atrial fibrillation: Secondary | ICD-10-CM | POA: Diagnosis not present

## 2015-10-20 DIAGNOSIS — Z5181 Encounter for therapeutic drug level monitoring: Secondary | ICD-10-CM

## 2015-10-20 DIAGNOSIS — Z86718 Personal history of other venous thrombosis and embolism: Secondary | ICD-10-CM

## 2015-10-20 LAB — POCT INR: INR: 3

## 2015-10-20 NOTE — Telephone Encounter (Signed)
Pt called pharmacy and rx not ready for pick up; spoke with Lorene Dy at [facility] and nasonex is ready for pick up. Pt voiced understanding.

## 2015-10-20 NOTE — Progress Notes (Signed)
Pre visit review using our clinic review tool, if applicable. No additional management support is needed unless otherwise documented below in the visit note. 

## 2015-12-09 ENCOUNTER — Ambulatory Visit (INDEPENDENT_AMBULATORY_CARE_PROVIDER_SITE_OTHER): Payer: 59 | Admitting: Family Medicine

## 2015-12-09 ENCOUNTER — Encounter: Payer: Self-pay | Admitting: Family Medicine

## 2015-12-09 ENCOUNTER — Ambulatory Visit (INDEPENDENT_AMBULATORY_CARE_PROVIDER_SITE_OTHER): Payer: 59 | Admitting: *Deleted

## 2015-12-09 VITALS — BP 110/68 | HR 67 | Temp 98.3°F

## 2015-12-09 DIAGNOSIS — J209 Acute bronchitis, unspecified: Secondary | ICD-10-CM | POA: Diagnosis not present

## 2015-12-09 DIAGNOSIS — Z5181 Encounter for therapeutic drug level monitoring: Secondary | ICD-10-CM | POA: Diagnosis not present

## 2015-12-09 DIAGNOSIS — I4891 Unspecified atrial fibrillation: Secondary | ICD-10-CM | POA: Diagnosis not present

## 2015-12-09 DIAGNOSIS — Z86718 Personal history of other venous thrombosis and embolism: Secondary | ICD-10-CM | POA: Diagnosis not present

## 2015-12-09 DIAGNOSIS — I2699 Other pulmonary embolism without acute cor pulmonale: Secondary | ICD-10-CM | POA: Diagnosis not present

## 2015-12-09 LAB — POCT INR: INR: 3.1

## 2015-12-09 MED ORDER — DOXYCYCLINE HYCLATE 100 MG PO TABS: 100.0000 mg | ORAL_TABLET | Freq: Two times a day (BID) | ORAL | Status: DC

## 2015-12-09 MED ORDER — HYDROCOD POLST-CPM POLST ER 10-8 MG/5ML PO SUER: 5.0000 mL | Freq: Two times a day (BID) | ORAL | Status: DC | PRN

## 2015-12-09 MED ORDER — WARFARIN SODIUM 5 MG PO TABS: ORAL_TABLET | ORAL | Status: DC

## 2015-12-09 MED ORDER — PREDNISONE 10 MG PO TABS: ORAL_TABLET | ORAL | Status: DC

## 2015-12-09 MED ORDER — METHYLPREDNISOLONE ACETATE 40 MG/ML IJ SUSP
40.0000 mg | Freq: Once | INTRAMUSCULAR | Status: AC
  Administered 2015-12-09: 40 mg via INTRAMUSCULAR

## 2015-12-09 NOTE — Progress Notes (Signed)
Pre visit review using our clinic review tool, if applicable. No additional management support is needed unless otherwise documented below in the visit note. 

## 2015-12-09 NOTE — Patient Instructions (Signed)
Take doxycycline as directed - you will need earlier INR check with this - it can affect the coumadin  Depo medrol shot today- then start prednisone tomorrow and take as directed (it will increase sugar levels)  tussionex for cough with caution of sedation  Continue breathing treatments Drink lots of fluids

## 2015-12-09 NOTE — Progress Notes (Signed)
Subjective:    Patient ID: Amy Bryant, female    DOB: [birth year], 60 y.o.   MRN: [id number]  HPI Here for uri symptoms   Started early this week  Worse on Wed - then bad prod cough-thick and green (worse early and late)  Wheezing pretty bad this am - little better now  Tight in chest - sore with cough as well in front of chest   Started in head-congested Scratchy throat-that is improved  Ears are ok   Pulse ox 94 % off the 02 (had taken it off)   Using her albuterol neb treatments every 4 hours  symbicort-does not miss doses   Has not had a fever    Thinking about applying for disability for her multiple health problems   Patient Active Problem List   Diagnosis Date Noted  . Acute bronchitis with bronchospasm 12/09/2015  . Colon cancer screening 12/15/2013  . Encounter for therapeutic drug monitoring 11/05/2013  . Routine general medical examination at a health care facility 05/20/2012  . Atrial fibrillation (HCC) 11/23/2010  . Pulmonary embolus (HCC) 11/23/2010  . Vitamin D deficiency 11/15/2010  . DYSURIA, HX OF 08/19/2009  . PURE HYPERCHOLESTEROLEMIA 08/16/2009  . ATRIAL FIBRILLATION, HX OF 12/03/2008  . Diabetes type 2, controlled (HCC) 08/13/2008  . Hyperparathyroidism (HCC) 07/30/2008  . Hypothyroidism 02/13/2008  . Morbid obesity (HCC) 02/04/2008  . REACTIVE AIRWAY DISEASE 02/04/2008  . GERD 02/04/2008  . DISC DISEASE, LUMBAR 02/04/2008  . PULMONARY EMBOLISM, HX OF 02/04/2008  . TOBACCO USE, QUIT 02/04/2008  . OBESITY HYPOVENTILATION SYNDROME 08/11/2007  . COR PULMONALE 08/11/2007   Past Medical History  Diagnosis Date  . GERD (gastroesophageal reflux disease)   . Pulmonary embolism (HCC)     hx of on coumadin  . Morbid obesity (HCC)   . Reactive airways dysfunction syndrome   . Cor pulmonale (HCC)     and obesity hypoventilation syndrome  . Hypothyroid   . OA (osteoarthritis)   . Atrial fibrillation [facility])     with cardioversion success    Past Surgical History  Procedure Laterality Date  . Tonsillectomy     Social History  Substance Use Topics  . Smoking status: Former Games developer  . Smokeless tobacco: None  . Alcohol Use: No   Family History  Problem Relation Age of Onset  . Heart disease Father 2    MI  . Diabetes Brother    Allergies  Allergen Reactions  . Augmentin [Amoxicillin-Pot Clavulanate] Other (See Comments)    Rash itching  . Pseudoephedrine     REACTION: tightness in chest   Current Outpatient Prescriptions on File Prior to Visit  Medication Sig Dispense Refill  . albuterol (PROVENTIL) (2.5 MG/3ML) 0.083% nebulizer solution PUT 1 VIAL INTO NEBULIZER EVERY 4 HOURS AS NEEDED FOR WHEEZE 150 mL 5  . cholecalciferol (VITAMIN D) 1000 UNITS tablet Take 4,000 Units by mouth daily.    . ciclopirox (LOPROX) 0.77 % cream APPLY TO AFFECTED AREA TWICE A DAY AS NEEDED 90 g 1  . diclofenac (VOLTAREN) 75 MG EC tablet TAKE 1 TABLET BY MOUTH TWICE A DAY AS NEEDED FOR PAIN WITH FOOD 180 tablet 3  . diltiazem (TIAZAC) 120 MG 24 hr capsule Take 1 capsule (120 mg total) by mouth daily. 90 capsule 3  . ergocalciferol (VITAMIN D2) 50000 UNITS capsule Take 1 capsule (50,000 Units total) by mouth once a week. 12 capsule 0  . fluconazole (DIFLUCAN) 150 MG tablet Take 1 tablet (150 mg  total) by mouth once. 1 tablet 0  . furosemide (LASIX) 80 MG tablet Take 1 tablet (80 mg total) by mouth 2 (two) times daily. 180 tablet 3  . glipiZIDE (GLUCOTROL XL) 5 MG 24 hr tablet Take 1 tablet (5 mg total) by mouth daily. 90 tablet 3  . glucose blood test strip Test blood sugar once daily and as needed for DM 250.0 100 each 3  . Lancets (ONETOUCH ULTRASOFT) lancets Test blood sugar once daily and as needed for DM 250.0     . levothyroxine (SYNTHROID, LEVOTHROID) 175 MCG tablet TAKE 1 TABLET BY MOUTH DAILY BEFORE BREAKFAST 30 tablet 11  . loratadine (CLARITIN) 10 MG tablet OTC as directed.     . metFORMIN (GLUCOPHAGE) 1000 MG tablet TAKE 1  TABLET BY MOUTH TWICE A DAY WITH A MEAL 180 tablet 3  . mometasone (ELOCON) 0.1 % cream Tiny amount to each ear canal twice weekly as needed.    . mometasone (NASONEX) 50 MCG/ACT nasal spray PLACE 2 SPRAYS INTO THE NOSE DAILY. 17 g 5  . Multiple Vitamin (MULTIVITAMIN) capsule Take 1 capsule by mouth daily.      . mupirocin ointment (BACTROBAN) 2 % APPLY TO AFFECTED AREA TWICE A DAY 22 g 4  . omega-3 acid ethyl esters (LOVAZA) 1 G capsule Take by mouth 2 (two) times daily.    . Omega-3 Fatty Acids (FISH OIL PO) Take 1 tablet by mouth daily.    Marland Kitchen. omeprazole (PRILOSEC) 20 MG capsule Take 20 mg by mouth 2 (two) times daily.     . potassium chloride SA (KLOR-CON M20) 20 MEQ tablet TAKE 2 TABS BY MOUTH ONCE DAILY 180 tablet 3  . simvastatin (ZOCOR) 20 MG tablet Take 1 tablet (20 mg total) by mouth at bedtime. 90 tablet 3  . SYMBICORT 160-4.5 MCG/ACT inhaler INHALE 2 PUFFS INTO THE LUNGS 2 (TWO) TIMES DAILY. 10.2 Inhaler 5   No current facility-administered medications on file prior to visit.     Review of Systems  Constitutional: Positive for appetite change and fatigue. Negative for fever.  HENT: Positive for congestion, postnasal drip, rhinorrhea, sinus pressure, sneezing and sore throat. Negative for ear pain.   Eyes: Negative for pain and discharge.  Respiratory: Positive for cough. Negative for shortness of breath, wheezing and stridor.   Cardiovascular: Negative for chest pain.  Gastrointestinal: Negative for nausea, vomiting and diarrhea.  Genitourinary: Negative for urgency, frequency and hematuria.  Musculoskeletal: Negative for myalgias and arthralgias.  Skin: Negative for rash.  Neurological: Positive for headaches. Negative for dizziness, weakness and light-headedness.  Psychiatric/Behavioral: Negative for confusion and dysphoric mood.       Objective:   Physical Exam  Constitutional: She appears well-developed and well-nourished. No distress.  Obese and fatigued app    HENT:   Head: Normocephalic and atraumatic.  Right Ear: External ear normal.  Left Ear: External ear normal.  Mouth/Throat: Oropharynx is clear and moist.  Nares are injected and congested  No sinus tenderness Clear rhinorrhea and post nasal drip   Eyes: Conjunctivae and EOM are normal. Pupils are equal, round, and reactive to light. Right eye exhibits no discharge. Left eye exhibits no discharge.  Neck: Normal range of motion. Neck supple.  Cardiovascular: Normal rate and normal heart sounds.   Pulmonary/Chest: Effort normal. No respiratory distress. She has wheezes. She has no rales. She exhibits no tenderness.  Diffusely distant bs Diffuse exp wheezes  No rales Diffuse rhonchi   Lymphadenopathy:    She  has no cervical adenopathy.  Neurological: She is alert.  Skin: Skin is warm and dry. No rash noted.  Psychiatric: She has a normal mood and affect.          Assessment & Plan:   Problem List Items Addressed This Visit      Respiratory   Acute bronchitis with bronchospasm - Primary    In pt with baseline hypoxia/cor pulmonale Cover with doxycycline - attn to INR tussionex with caution for cough  pred taper (after depo medrol shot today)-aware blood sugar will go up Continue inhalers  Take doxycycline as directed - you will need earlier INR check with this - it can affect the coumadin  Depo medrol shot today- then start prednisone tomorrow and take as directed (it will increase sugar levels)  tussionex for cough with caution of sedation  Continue breathing treatments Drink lots of fluids       Relevant Medications   methylPREDNISolone acetate (DEPO-MEDROL) injection 40 mg (Completed)

## 2015-12-11 NOTE — Assessment & Plan Note (Signed)
In pt with baseline hypoxia/cor pulmonale Cover with doxycycline - attn to INR tussionex with caution for cough  pred taper (after depo medrol shot today)-aware blood sugar will go up Continue inhalers  Take doxycycline as directed - you will need earlier INR check with this - it can affect the coumadin  Depo medrol shot today- then start prednisone tomorrow and take as directed (it will increase sugar levels)  tussionex for cough with caution of sedation  Continue breathing treatments Drink lots of fluids

## 2015-12-12 ENCOUNTER — Other Ambulatory Visit: Payer: Self-pay | Admitting: Family Medicine

## 2015-12-15 ENCOUNTER — Other Ambulatory Visit: Payer: Self-pay

## 2015-12-15 DIAGNOSIS — T3695XA Adverse effect of unspecified systemic antibiotic, initial encounter: Principal | ICD-10-CM

## 2015-12-15 DIAGNOSIS — B379 Candidiasis, unspecified: Secondary | ICD-10-CM

## 2015-12-15 MED ORDER — FLUCONAZOLE 150 MG PO TABS: 150.0000 mg | ORAL_TABLET | Freq: Once | ORAL | Status: DC

## 2015-12-15 NOTE — Telephone Encounter (Signed)
Sent in  Skip cholesterol medicine the day she takes it  Get INR checked as planned

## 2015-12-15 NOTE — Telephone Encounter (Signed)
Pt left v/m; pt was seen 12/09/15 and has been taking abx and needs diflucan for yeast infection [facility] [address]. Pt request cb.

## 2015-12-15 NOTE — Telephone Encounter (Signed)
Pt notified Rx sent and advised of Dr. [name]'s comments and verbalized understanding  ?

## 2015-12-22 ENCOUNTER — Other Ambulatory Visit: Payer: Self-pay

## 2015-12-22 MED ORDER — DOXYCYCLINE HYCLATE 100 MG PO TABS
100.0000 mg | ORAL_TABLET | Freq: Two times a day (BID) | ORAL | Status: DC
Start: 2015-12-22 — End: 2016-11-13

## 2015-12-22 MED ORDER — PREDNISONE 10 MG PO TABS: ORAL_TABLET | ORAL | Status: DC

## 2015-12-22 NOTE — Telephone Encounter (Signed)
Rx refilled and pt notified of Dr. Royden Purlower's comments

## 2015-12-22 NOTE — Telephone Encounter (Signed)
Please refill times one  F/u if no improvement 

## 2015-12-22 NOTE — Telephone Encounter (Signed)
Pt left v/m; pt was seen 12/09/15 and has taken the abx and prednisone. After taking meds pt felt slightly better but starting 12/21/15 pt feels like tightening in chest, runny nose and dry and prod cough with yellow phlegm. No fever. Pt does not have car and no way to come in for appt today. Pt request refill of abx and prednisone to [facility].Pt request cb.

## 2015-12-22 NOTE — Telephone Encounter (Signed)
Electronic refill request, please advise  

## 2016-02-02 ENCOUNTER — Ambulatory Visit: Payer: 59

## 2016-02-09 ENCOUNTER — Ambulatory Visit: Payer: 59

## 2016-02-10 ENCOUNTER — Other Ambulatory Visit: Payer: Self-pay | Admitting: Family Medicine

## 2016-02-13 ENCOUNTER — Ambulatory Visit (INDEPENDENT_AMBULATORY_CARE_PROVIDER_SITE_OTHER): Payer: BLUE CROSS/BLUE SHIELD | Admitting: *Deleted

## 2016-02-13 ENCOUNTER — Telehealth: Payer: Self-pay | Admitting: Family Medicine

## 2016-02-13 DIAGNOSIS — I4891 Unspecified atrial fibrillation: Secondary | ICD-10-CM | POA: Diagnosis not present

## 2016-02-13 DIAGNOSIS — I2699 Other pulmonary embolism without acute cor pulmonale: Secondary | ICD-10-CM | POA: Diagnosis not present

## 2016-02-13 DIAGNOSIS — Z5181 Encounter for therapeutic drug level monitoring: Secondary | ICD-10-CM | POA: Diagnosis not present

## 2016-02-13 DIAGNOSIS — Z86718 Personal history of other venous thrombosis and embolism: Secondary | ICD-10-CM | POA: Diagnosis not present

## 2016-02-13 LAB — POCT INR: INR: 2.3

## 2016-02-13 MED ORDER — WARFARIN SODIUM 5 MG PO TABS: ORAL_TABLET | ORAL | Status: DC

## 2016-02-13 NOTE — Progress Notes (Signed)
Pre visit review using our clinic review tool, if applicable. No additional management support is needed unless otherwise documented below in the visit note. 

## 2016-02-13 NOTE — Telephone Encounter (Signed)
Done and in IN box 

## 2016-02-13 NOTE — Telephone Encounter (Signed)
Husband dropped off handicap form. Can you please fill out and mail back in the self addressed envelope?  Placing in rx tower  Thank you

## 2016-02-14 NOTE — Telephone Encounter (Signed)
Form mailed to pt.

## 2016-03-26 ENCOUNTER — Other Ambulatory Visit: Payer: BLUE CROSS/BLUE SHIELD

## 2016-04-18 ENCOUNTER — Telehealth: Payer: Self-pay

## 2016-04-18 NOTE — Telephone Encounter (Signed)
Pt request refill diltiazem to [facility]. Advised [facility] to [facility] for transfer of med. Pt voiced understanding.

## 2016-06-11 ENCOUNTER — Other Ambulatory Visit: Payer: Self-pay | Admitting: Family Medicine

## 2016-06-12 NOTE — Telephone Encounter (Signed)
done

## 2016-06-12 NOTE — Telephone Encounter (Signed)
Please refill times 5 

## 2016-06-12 NOTE — Telephone Encounter (Signed)
No recent appts but I don't think pt has insurance right now, please advise

## 2016-07-08 ENCOUNTER — Other Ambulatory Visit: Payer: Self-pay | Admitting: Family Medicine

## 2016-07-09 ENCOUNTER — Other Ambulatory Visit: Payer: Self-pay | Admitting: Family Medicine

## 2016-07-09 ENCOUNTER — Other Ambulatory Visit: Payer: Self-pay

## 2016-07-09 NOTE — Telephone Encounter (Signed)
Pt left v/m requesting 90 day refill warfarin to [facility]. Last refilled # 90 on 02/13/16; last f/u 07/11/15 and no future appt scheduled.

## 2016-07-09 NOTE — Telephone Encounter (Signed)
No recent/future appts., ? If pt has insurance, please advise

## 2016-07-09 NOTE — Telephone Encounter (Signed)
Refill for 6 mo

## 2016-07-09 NOTE — Telephone Encounter (Signed)
Has she had a protime since May?  Overdue  Schedule that if she needs it and refill 90 day

## 2016-07-10 NOTE — Telephone Encounter (Signed)
She hasn't had labs since May. She said she's currently not working and doesn't have insurance. She also needs refills on thyroid med, Cartia and lasix.

## 2016-07-10 NOTE — Telephone Encounter (Signed)
Pt left v/m requesting cb about refill. 

## 2016-07-10 NOTE — Telephone Encounter (Signed)
Pt left /vm requesting cb about refills.

## 2016-07-10 NOTE — Telephone Encounter (Signed)
Pt left v/m requesting cb about status of refill. 

## 2016-07-10 NOTE — Telephone Encounter (Signed)
Could someone up front perhaps talk to her about a payment plan for visits and labs ? We need a protime-that is non negotiable -before we refill coumadin (can refill enough to get to her protime appt if needed)   Refill other meds for 3 mo please   Thanks   Will go from there

## 2016-07-10 NOTE — Telephone Encounter (Signed)
Pt left v/m requesting cb about refills. 

## 2016-07-10 NOTE — Telephone Encounter (Signed)
Spoke with patient and advised that Rx's refilled except for Coumadin.  Patient advised that someone from the front office has been asked to call her regarding a payment plan for her visits/labs.

## 2016-07-18 MED ORDER — WARFARIN SODIUM 5 MG PO TABS: ORAL_TABLET | ORAL | 0 refills | Status: DC

## 2016-07-18 NOTE — Telephone Encounter (Signed)
Pt called requesting refill for warfarin # 90. Pt has appt on 07/19/16 for lab and pt will be at that appt; [facility] and plans to go by pharmacy on her way home and pt is not sure when she will have transportation again. I advised per this note I could send in a few warfarin until gets PT/INR report. Pt said she needs the # 90 because she does not know when she will have transportation again. Please advise. Pt request cb.[facility].

## 2016-07-18 NOTE — Telephone Encounter (Signed)
I spoke with Dr Milinda Antisower and she said OK to send # 90 as long as pt will keep lab appt. Pt notified and very appreciative and will keep appt on 07/19/16.

## 2016-07-19 ENCOUNTER — Other Ambulatory Visit (INDEPENDENT_AMBULATORY_CARE_PROVIDER_SITE_OTHER): Payer: Self-pay

## 2016-07-19 DIAGNOSIS — I2699 Other pulmonary embolism without acute cor pulmonale: Secondary | ICD-10-CM

## 2016-07-19 LAB — POCT INR: INR: 3.4

## 2016-08-06 ENCOUNTER — Other Ambulatory Visit: Payer: Self-pay | Admitting: Family Medicine

## 2016-08-07 NOTE — Telephone Encounter (Signed)
Pt hasn't been seen since 07/11/15.

## 2016-08-07 NOTE — Telephone Encounter (Signed)
Left message to return call 

## 2016-08-08 NOTE — Telephone Encounter (Signed)
Pt left v/m requesting status of refills for glipizide(last filled # 90 x 3 on 07/11/15),metformin(last refilled # 180 x 3 on 07/11/15) and simvastatin(last filled # 90 x 3 on 07/11/15). Pt request cb.

## 2016-08-08 NOTE — Telephone Encounter (Signed)
Patient returned Shapale's call.  Please call patient back at [phone].

## 2016-08-08 NOTE — Telephone Encounter (Signed)
Please refill each times one  I do need labs and a visit soon if we want to continue treatment  I know she does not have insurance  Is there anyone here or at [facility] that can help her search out other free or sliding scale clinics ? I'm not sure if she may qualify for medicaid if she applied either -? Worth looking in to   I know she has been struggling with this

## 2016-08-09 MED ORDER — SIMVASTATIN 20 MG PO TABS: 20.0000 mg | ORAL_TABLET | Freq: Every day | ORAL | 0 refills | Status: DC

## 2016-08-09 NOTE — Telephone Encounter (Signed)
Please call patient

## 2016-08-09 NOTE — Telephone Encounter (Signed)
Spoke with pt and she was approved for disability and it starts next month so she will be able to schedule a f/u and a lab appt, and INR appt next month, pt advise meds filled for one month and she needs to have the appt on the books before we can refill her meds again, pt verbalized understanding and said that wont be a problem

## 2016-09-13 ENCOUNTER — Telehealth: Payer: Self-pay | Admitting: Family Medicine

## 2016-09-13 NOTE — Telephone Encounter (Signed)
Amy Bryant- can you squeeze her in anywhere tomorrow?

## 2016-09-13 NOTE — Telephone Encounter (Signed)
Pt has appt with Dr Milinda Antisower on12/20/17 at 3:45. Per note pt was offered sooner appt but wanted to wait til 09/19/16.

## 2016-09-13 NOTE — Telephone Encounter (Signed)
[facility] Day - Client TELEPHONE ADVICE RECORD [facility] Patient Name: Amy Bryant DOB: 1955-12-27 Initial Comment Caller states his wife is having trouble breathing when she moves around. She is coughing and spitting up green liquid/clear liquid. Nurse Assessment Nurse: Josie SaundersGerard, RN, Erskine SquibbJane Date/Time Lamount Cohen(Eastern Time): 09/13/2016 4:27:26 PM Confirm and document reason for call. If symptomatic, describe symptoms. ---Caller states his wife is having trouble breathing when she moves around. She is coughing and spitting up green liquid/ clear liquid. Sinuses are congested. Cough productive green mucus. Chest is getting tight. Has appointment next Wednesday at Coumadin clinic and is wanting appointment same day. States has no money until check comes in. Does the patient have any new or worsening symptoms? ---Yes Will a triage be completed? ---Yes Related visit to physician within the last 2 weeks? ---NoDoes the PT have any chronic conditions? (i.e. diabetes, asthma, etc.) ---Yes List chronic conditions. ---COPD, HX CHF, Diabetes Warfarin HX Blood Clots Is this a behavioral health or substance abuse call? ---No Guidelines Guideline Title Affirmed Question Affirmed Notes Cough - Acute Productive [1] Sinus pain (around cheekbone or eye) AND [2] present > 24 hours using nasal washes and pain meds Final Disposition User See Physician within 24 Hours Josie SaundersGerard, RN, Erskine SquibbJane Comments CALLER IS REQUESTING A CALL BACK. Referrals GO TO FACILITY REFUSED Disagree/Comply: Disagree Disagree/Comply Reason: Wait and see  Call Id: [phone]

## 2016-09-14 ENCOUNTER — Ambulatory Visit (INDEPENDENT_AMBULATORY_CARE_PROVIDER_SITE_OTHER): Payer: Self-pay | Admitting: Family Medicine

## 2016-09-14 ENCOUNTER — Encounter: Payer: Self-pay | Admitting: Family Medicine

## 2016-09-14 ENCOUNTER — Ambulatory Visit (INDEPENDENT_AMBULATORY_CARE_PROVIDER_SITE_OTHER): Payer: Self-pay

## 2016-09-14 VITALS — BP 128/76 | HR 67 | Temp 97.9°F

## 2016-09-14 DIAGNOSIS — Z598 Other problems related to housing and economic circumstances: Secondary | ICD-10-CM

## 2016-09-14 DIAGNOSIS — J0111 Acute recurrent frontal sinusitis: Secondary | ICD-10-CM

## 2016-09-14 DIAGNOSIS — I4891 Unspecified atrial fibrillation: Secondary | ICD-10-CM

## 2016-09-14 DIAGNOSIS — J019 Acute sinusitis, unspecified: Secondary | ICD-10-CM | POA: Insufficient documentation

## 2016-09-14 DIAGNOSIS — Z86718 Personal history of other venous thrombosis and embolism: Secondary | ICD-10-CM

## 2016-09-14 DIAGNOSIS — Z599 Problem related to housing and economic circumstances, unspecified: Secondary | ICD-10-CM

## 2016-09-14 DIAGNOSIS — Z5181 Encounter for therapeutic drug level monitoring: Secondary | ICD-10-CM

## 2016-09-14 LAB — POCT INR: INR: 2.6

## 2016-09-14 MED ORDER — DICLOFENAC SODIUM 75 MG PO TBEC: DELAYED_RELEASE_TABLET | ORAL | 3 refills | Status: DC

## 2016-09-14 MED ORDER — DOXYCYCLINE HYCLATE 100 MG PO TABS: 100.0000 mg | ORAL_TABLET | Freq: Two times a day (BID) | ORAL | 0 refills | Status: DC

## 2016-09-14 MED ORDER — GLIPIZIDE ER 5 MG PO TB24: 5.0000 mg | ORAL_TABLET | Freq: Every day | ORAL | 11 refills | Status: DC

## 2016-09-14 MED ORDER — FLUCONAZOLE 150 MG PO TABS: 150.0000 mg | ORAL_TABLET | Freq: Once | ORAL | 0 refills | Status: AC

## 2016-09-14 MED ORDER — GUAIFENESIN-CODEINE 100-10 MG/5ML PO SYRP: 5.0000 mL | ORAL_SOLUTION | Freq: Four times a day (QID) | ORAL | 0 refills | Status: DC | PRN

## 2016-09-14 MED ORDER — POTASSIUM CHLORIDE CRYS ER 20 MEQ PO TBCR: EXTENDED_RELEASE_TABLET | ORAL | 11 refills | Status: DC

## 2016-09-14 MED ORDER — METFORMIN HCL 1000 MG PO TABS: 1000.0000 mg | ORAL_TABLET | Freq: Two times a day (BID) | ORAL | 3 refills | Status: DC

## 2016-09-14 MED ORDER — SIMVASTATIN 20 MG PO TABS: 20.0000 mg | ORAL_TABLET | Freq: Every day | ORAL | 11 refills | Status: DC

## 2016-09-14 NOTE — Progress Notes (Signed)
Subjective:    Patient ID: Amy Bryant, female    DOB: Jul 27, 1956, 60 y.o.   MRN: [id number]  HPI Here for uri symptoms   She has hx of DM and also reactive airways (past hx of PE) and cor pulmonale  No insurance so cannot come in for regular visits   Started symptoms last week -cough and chest congestion Always has nasal congestion  Mucous - blood and green  Green from chest   L sided facial pain around her eye   L ear hurts  Throat is raspy - loosing her voice   Wheezing - only with a coughing spell No fever   Otc: simply saline and also cough drops  In past mucinex makes her worse  Can tolerate robitussin   Turned her 2 up to 4L when needed  Pulse ox is 93%  Still unable to f/u for some of her chronic problems/ get eye exam/ get labs due to inability to afford it  Will get disability soon and medicare in a year  Patient Active Problem List   Diagnosis Date Noted  . Financial difficulties 09/16/2016  . Acute sinusitis 09/14/2016  . Colon cancer screening 12/15/2013  . Encounter for therapeutic drug monitoring 11/05/2013  . Routine general medical examination at a health care facility 05/20/2012  . Atrial fibrillation (HCC) 11/23/2010  . Pulmonary embolus (HCC) 11/23/2010  . Vitamin D deficiency 11/15/2010  . DYSURIA, HX OF 08/19/2009  . PURE HYPERCHOLESTEROLEMIA 08/16/2009  . ATRIAL FIBRILLATION, HX OF 12/03/2008  . Diabetes type 2, controlled (HCC) 08/13/2008  . Hyperparathyroidism (HCC) 07/30/2008  . Hypothyroidism 02/13/2008  . Morbid obesity (HCC) 02/04/2008  . REACTIVE AIRWAY DISEASE 02/04/2008  . GERD 02/04/2008  . DISC DISEASE, LUMBAR 02/04/2008  . PULMONARY EMBOLISM, HX OF 02/04/2008  . TOBACCO USE, QUIT 02/04/2008  . OBESITY HYPOVENTILATION SYNDROME 08/11/2007  . COR PULMONALE 08/11/2007   Past Medical History:  Diagnosis Date  . Atrial fibrillation (HCC)    with cardioversion success  . Cor pulmonale (HCC)    and obesity hypoventilation  syndrome  . GERD (gastroesophageal reflux disease)   . Hypothyroid   . Morbid obesity (HCC)   . OA (osteoarthritis)   . Pulmonary embolism (HCC)    hx of on coumadin  . Reactive airways dysfunction syndrome    Past Surgical History:  Procedure Laterality Date  . TONSILLECTOMY     Social History  Substance Use Topics  . Smoking status: Former Games developermoker  . Smokeless tobacco: Not on file  . Alcohol use No   Family History  Problem Relation Age of Onset  . Heart disease Father 4351    MI  . Diabetes Brother    Allergies  Allergen Reactions  . Augmentin [Amoxicillin-Pot Clavulanate] Other (See Comments)    Rash itching  . Pseudoephedrine     REACTION: tightness in chest   Current Outpatient Prescriptions on File Prior to Visit  Medication Sig Dispense Refill  . albuterol (PROVENTIL) (2.5 MG/3ML) 0.083% nebulizer solution USE ONE VIAL IN NEBULIZER EVERY 4 HOURS AS NEEDED FOR WHEEZING 150 mL 5  . CARTIA XT 120 MG 24 hr capsule TAKE ONE CAPSULE BY MOUTH ONCE DAILY 30 capsule 2  . chlorpheniramine-HYDROcodone (TUSSIONEX PENNKINETIC ER) 10-8 MG/5ML SUER Take 5 mLs by mouth every 12 (twelve) hours as needed for cough (caution of sedation). 120 mL 0  . cholecalciferol (VITAMIN D) 1000 UNITS tablet Take 4,000 Units by mouth daily.    . ciclopirox (LOPROX) 0.77 %  cream APPLY TO AFFECTED AREA TWICE A DAY AS NEEDED 90 g 0  . diltiazem (TIAZAC) 120 MG 24 hr capsule Take 1 capsule (120 mg total) by mouth daily. 90 capsule 3  . doxycycline (VIBRA-TABS) 100 MG tablet Take 1 tablet (100 mg total) by mouth 2 (two) times daily. 14 tablet 0  . ergocalciferol (VITAMIN D2) 50000 UNITS capsule Take 1 capsule (50,000 Units total) by mouth once a week. 12 capsule 0  . fluconazole (DIFLUCAN) 150 MG tablet Take 1 tablet (150 mg total) by mouth once. 1 tablet 0  . furosemide (LASIX) 80 MG tablet TAKE ONE TABLET BY MOUTH TWICE DAILY 180 tablet 5  . glucose blood test strip Test blood sugar once daily and as  needed for DM 250.0 100 each 3  . Lancets (ONETOUCH ULTRASOFT) lancets Test blood sugar once daily and as needed for DM 250.0     . levothyroxine (SYNTHROID, LEVOTHROID) 175 MCG tablet TAKE ONE TABLET BY MOUTH ONCE DAILY BEFORE  BREAKFAST 30 tablet 3  . loratadine (CLARITIN) 10 MG tablet OTC as directed.     . mometasone (ELOCON) 0.1 % cream Tiny amount to each ear canal twice weekly as needed.    . mometasone (NASONEX) 50 MCG/ACT nasal spray PLACE 2 SPRAYS INTO THE NOSE DAILY. 17 g 5  . Multiple Vitamin (MULTIVITAMIN) capsule Take 1 capsule by mouth daily.      . mupirocin ointment (BACTROBAN) 2 % APPLY TO AFFECTED AREA TWICE A DAY 22 g 4  . omega-3 acid ethyl esters (LOVAZA) 1 G capsule Take by mouth 2 (two) times daily.    . Omega-3 Fatty Acids (FISH OIL PO) Take 1 tablet by mouth daily.    Marland Kitchen. omeprazole (PRILOSEC) 20 MG capsule Take 20 mg by mouth 2 (two) times daily.     . SYMBICORT 160-4.5 MCG/ACT inhaler INHALE 2 PUFFS INTO THE LUNGS 2 (TWO) TIMES DAILY. 10.2 Inhaler 5  . warfarin (COUMADIN) 5 MG tablet TAKE AS DIRECTED BY THE ANTICOAGULATION CLINIC 90 tablet 0   No current facility-administered medications on file prior to visit.     Review of Systems  Constitutional: Positive for appetite change. Negative for fatigue and fever.  HENT: Positive for congestion, ear pain, postnasal drip, rhinorrhea, sinus pressure and sore throat. Negative for nosebleeds.   Eyes: Negative for pain, redness and itching.  Respiratory: Positive for cough, shortness of breath and wheezing.   Cardiovascular: Negative for chest pain.  Gastrointestinal: Negative for abdominal pain, diarrhea, nausea and vomiting.  Endocrine: Negative for polyuria.  Genitourinary: Negative for dysuria, frequency and urgency.  Musculoskeletal: Negative for arthralgias and myalgias.  Allergic/Immunologic: Negative for immunocompromised state.  Neurological: Positive for headaches. Negative for dizziness, tremors, syncope,  weakness and numbness.  Hematological: Negative for adenopathy. Does not bruise/bleed easily.  Psychiatric/Behavioral: Negative for dysphoric mood. The patient is not nervous/anxious.        Objective:   Physical Exam  Constitutional: She appears well-developed and well-nourished. No distress.  Obese female in wheelchair-wearing 02 by   HENT:  Head: Normocephalic and atraumatic.  Right Ear: External ear normal.  Left Ear: External ear normal.  Mouth/Throat: Oropharynx is clear and moist. No oropharyngeal exudate.  Nares are injected and congested  Frontal and maxillary sinus tenderness- worse on the L side  Post nasal drip   Eyes: Conjunctivae and EOM are normal. Pupils are equal, round, and reactive to light. Right eye exhibits no discharge. Left eye exhibits no discharge.  Neck: Normal  range of motion. Neck supple.  Cardiovascular: Normal rate and regular rhythm.   Pulmonary/Chest: Effort normal and breath sounds normal. No respiratory distress. She has no wheezes. She has no rales.  Diffusely distant bs  No wheeze  No rales  Few scattered rhonchi  Lymphadenopathy:    She has no cervical adenopathy.  Neurological: She is alert. No cranial nerve deficit.  Skin: Skin is warm and dry. No rash noted. No pallor.  Psychiatric: She has a normal mood and affect.          Assessment & Plan:   Problem List Items Addressed This Visit      Respiratory   Acute sinusitis    With L sided symptoms- and also cough/congestion  Px doxycycline (will watch INR closely with this ) Robitussin ac prn (caution of sedation) Continue inhalers and 02 (not wheezing currently) Given px for diflucan to use if needed for yeast (also watch INR if she needs this) Disc symptomatic care - see instructions on AVS Update if not starting to improve in a week or if worsening         Relevant Medications   doxycycline (VIBRA-TABS) 100 MG tablet   guaiFENesin-codeine (ROBITUSSIN AC) 100-10 MG/5ML  syrup     Other   Financial difficulties    Pt has great difficulties currently affording medications and f/u for her chronic health problems She is overdue for some labs and f/u and states she cannot right now  Will have medicare in a year Getting disability currently  Not candidate for medicaid  Will continue to follow

## 2016-09-14 NOTE — Patient Instructions (Signed)
Pre visit review using our clinic review tool, if applicable. No additional management support is needed unless otherwise documented below in the visit note. 

## 2016-09-14 NOTE — Patient Instructions (Addendum)
Robitussin DM is fine for cough   (when not working or driving try the robitussin AC codeine that I px)   Drink lots of fluids  Rest  Alert me if your shortness of breath gets worse or you wheeze more   Update if not starting to improve in a week or if worsening    Pro time today - will need to follow closely with the antibiotic   Don't take the diflucan unless you need it

## 2016-09-14 NOTE — Progress Notes (Signed)
Pre visit review using our clinic review tool, if applicable. No additional management support is needed unless otherwise documented below in the visit note. 

## 2016-09-14 NOTE — Telephone Encounter (Signed)
appt scheduled for today at 4:15pm

## 2016-09-16 DIAGNOSIS — Z599 Problem related to housing and economic circumstances, unspecified: Secondary | ICD-10-CM | POA: Insufficient documentation

## 2016-09-16 DIAGNOSIS — Z598 Other problems related to housing and economic circumstances: Secondary | ICD-10-CM | POA: Insufficient documentation

## 2016-09-16 NOTE — Assessment & Plan Note (Signed)
Pt has great difficulties currently affording medications and f/u for her chronic health problems She is overdue for some labs and f/u and states she cannot right now  Will have medicare in a year Getting disability currently  Not candidate for medicaid  Will continue to follow

## 2016-09-16 NOTE — Assessment & Plan Note (Signed)
With L sided symptoms- and also cough/congestion  Px doxycycline (will watch INR closely with this ) Robitussin ac prn (caution of sedation) Continue inhalers and 02 (not wheezing currently) Given px for diflucan to use if needed for yeast (also watch INR if she needs this) Disc symptomatic care - see instructions on AVS Update if not starting to improve in a week or if worsening

## 2016-09-19 ENCOUNTER — Ambulatory Visit: Payer: Self-pay | Admitting: Family Medicine

## 2016-09-19 ENCOUNTER — Telehealth: Payer: Self-pay

## 2016-09-19 MED ORDER — POTASSIUM CHLORIDE CRYS ER 20 MEQ PO TBCR: EXTENDED_RELEASE_TABLET | ORAL | 11 refills | Status: DC

## 2016-09-19 NOTE — Telephone Encounter (Signed)
Thanks for changing that.

## 2016-09-19 NOTE — Telephone Encounter (Signed)
Pt was seen 09/14/16 and Potassium 20 meq taking two tabs daily was [facility] to [facility] with quantity of # 30; to be one month supply needs to be # 60. Per med list current and hx pt has been taking 2 tabs daily; I spoke with Melonie at [facility] and changed quantity to # 60. Pt voiced understanding.FYI to Dr Milinda Antisower.

## 2016-10-16 ENCOUNTER — Other Ambulatory Visit: Payer: Self-pay | Admitting: Family Medicine

## 2016-10-16 NOTE — Telephone Encounter (Signed)
Pt had a recent acute appt but no recent f/u or CPE, please advise

## 2016-10-16 NOTE — Telephone Encounter (Signed)
She has been w/o insurance and unable to come in often  This will improve  Refill 6 mo  thanks

## 2016-11-09 ENCOUNTER — Other Ambulatory Visit: Payer: Self-pay

## 2016-11-09 MED ORDER — WARFARIN SODIUM 5 MG PO TABS: ORAL_TABLET | ORAL | 0 refills | Status: DC

## 2016-11-09 NOTE — Telephone Encounter (Signed)
Patient has not been compliant with monthly coumadin checks due to financial concerns.    Will refill #30 pills only and spoke with patient about importance of compliance with medication management and coumadin checks.  If patient continues to not show for routine appointments will have to consider other alternatives or dismissing her from clinic for safety concerns.    *patient no showed for 12/22 appointment following abx start and concerns at the time for elevated readings due to medication interaction.    Patient has set appointment up for 11/13/16 at 1:45 with the coumadin clinic.  I will monitor her compliance carefully.

## 2016-11-13 ENCOUNTER — Ambulatory Visit (INDEPENDENT_AMBULATORY_CARE_PROVIDER_SITE_OTHER): Payer: Self-pay | Admitting: Family Medicine

## 2016-11-13 ENCOUNTER — Ambulatory Visit (INDEPENDENT_AMBULATORY_CARE_PROVIDER_SITE_OTHER): Payer: Self-pay

## 2016-11-13 ENCOUNTER — Other Ambulatory Visit: Payer: Self-pay | Admitting: Family Medicine

## 2016-11-13 VITALS — BP 138/60 | HR 83 | Temp 97.6°F

## 2016-11-13 DIAGNOSIS — Z86718 Personal history of other venous thrombosis and embolism: Secondary | ICD-10-CM

## 2016-11-13 DIAGNOSIS — I4891 Unspecified atrial fibrillation: Secondary | ICD-10-CM

## 2016-11-13 DIAGNOSIS — J209 Acute bronchitis, unspecified: Secondary | ICD-10-CM

## 2016-11-13 DIAGNOSIS — Z5181 Encounter for therapeutic drug level monitoring: Secondary | ICD-10-CM

## 2016-11-13 DIAGNOSIS — E039 Hypothyroidism, unspecified: Secondary | ICD-10-CM

## 2016-11-13 DIAGNOSIS — J440 Chronic obstructive pulmonary disease with acute lower respiratory infection: Secondary | ICD-10-CM

## 2016-11-13 DIAGNOSIS — R0602 Shortness of breath: Secondary | ICD-10-CM

## 2016-11-13 DIAGNOSIS — E119 Type 2 diabetes mellitus without complications: Secondary | ICD-10-CM

## 2016-11-13 LAB — HEMOGLOBIN A1C: HEMOGLOBIN A1C: 6.3 % (ref 4.6–6.5)

## 2016-11-13 LAB — POCT INR: INR: 1.9

## 2016-11-13 LAB — TSH: TSH: 2.88 u[IU]/mL (ref 0.35–4.50)

## 2016-11-13 MED ORDER — GUAIFENESIN-CODEINE 100-10 MG/5ML PO SYRP: 5.0000 mL | ORAL_SOLUTION | Freq: Four times a day (QID) | ORAL | 0 refills | Status: DC | PRN

## 2016-11-13 MED ORDER — PREDNISONE 10 MG PO TABS: ORAL_TABLET | ORAL | 0 refills | Status: DC

## 2016-11-13 MED ORDER — DOXYCYCLINE HYCLATE 100 MG PO TABS: 100.0000 mg | ORAL_TABLET | Freq: Two times a day (BID) | ORAL | 0 refills | Status: DC

## 2016-11-13 MED ORDER — METHYLPREDNISOLONE ACETATE 40 MG/ML IJ SUSP
40.0000 mg | Freq: Once | INTRAMUSCULAR | Status: AC
  Administered 2016-11-13: 40 mg via INTRAMUSCULAR

## 2016-11-13 NOTE — Patient Instructions (Signed)
Your INR may go up with the antibiotic- keep your next appointment  If any bleeding problems- let us know  Robitussin AC for cough  Take the doxycycline as directed  Steroid shot today  Then take prednisone as directed starting tomorrow  Update if not starting to improve in a week or if worsening

## 2016-11-13 NOTE — Progress Notes (Signed)
Subjective:    Patient ID: Amy Bryant, female    DOB: [birth year], [age] y.o.   MRN: [id number]  HPI Here with respiratory symptoms   Has no insurance and cannot afford visits currently- but came in for INR and I was called on to see her for sob and copd symptoms   [place] PeersUri for a while Worse yesterday  Mostly breathing issues- sob/ raspy and wheezy when she moves around  Very tired No fever    Was here for INR and told the nurse she was sob  Temp is nl 97.6 bp 138/60 Pulse 83 Pulse ox coming in was 88 % on RA -w/o her 02 that she left in the waiting room   Cannot get a cxr here due to her morbid obesity   Hx of chronic pulmonary problems incl obesity hypoventolation and cor pulmonale from past PE and reactive airway dz Has symicort   Patient Active Problem List   Diagnosis Date Noted  . Acute bronchitis with COPD (HCC) 11/15/2016  . Financial difficulties 09/16/2016  . Colon cancer screening 12/15/2013  . Encounter for therapeutic drug monitoring 11/05/2013  . Routine general medical examination at a health care facility 05/20/2012  . Atrial fibrillation (HCC) 11/23/2010  . Pulmonary embolus (HCC) 11/23/2010  . Vitamin D deficiency 11/15/2010  . DYSURIA, HX OF 08/19/2009  . PURE HYPERCHOLESTEROLEMIA 08/16/2009  . ATRIAL FIBRILLATION, HX OF 12/03/2008  . Diabetes type 2, controlled (HCC) 08/13/2008  . Hyperparathyroidism (HCC) 07/30/2008  . Hypothyroidism 02/13/2008  . Morbid obesity (HCC) 02/04/2008  . REACTIVE AIRWAY DISEASE 02/04/2008  . GERD 02/04/2008  . DISC DISEASE, LUMBAR 02/04/2008  . PULMONARY EMBOLISM, HX OF 02/04/2008  . TOBACCO USE, QUIT 02/04/2008  . OBESITY HYPOVENTILATION SYNDROME 08/11/2007  . COR PULMONALE 08/11/2007   Past Medical History:  Diagnosis Date  . Atrial fibrillation (HCC)    with cardioversion success  . Cor pulmonale (HCC)    and obesity hypoventilation syndrome  . GERD (gastroesophageal reflux disease)   . Hypothyroid   . Morbid  obesity (HCC)   . OA (osteoarthritis)   . Pulmonary embolism (HCC)    hx of on coumadin  . Reactive airways dysfunction syndrome    Past Surgical History:  Procedure Laterality Date  . TONSILLECTOMY     Social History  Substance Use Topics  . Smoking status: Former Games developermoker  . Smokeless tobacco: Not on file  . Alcohol use No   Family History  Problem Relation Age of Onset  . Heart disease Father [id number]    MI  . Diabetes Brother    Allergies  Allergen Reactions  . Augmentin [Amoxicillin-Pot Clavulanate] Other (See Comments)    Rash itching  . Pseudoephedrine     REACTION: tightness in chest   Current Outpatient Prescriptions on File Prior to Visit  Medication Sig Dispense Refill  . albuterol (PROVENTIL) (2.5 MG/3ML) 0.083% nebulizer solution USE ONE VIAL IN NEBULIZER EVERY 4 HOURS AS NEEDED FOR WHEEZING 150 mL 5  . cholecalciferol (VITAMIN D) 1000 UNITS tablet Take 4,000 Units by mouth daily.    . ciclopirox (LOPROX) 0.77 % cream APPLY TO AFFECTED AREA TWICE A DAY AS NEEDED 90 g 0  . diclofenac (VOLTAREN) 75 MG EC tablet TAKE 1 TABLET BY MOUTH TWICE A DAY AS NEEDED FOR PAIN WITH FOOD 180 tablet 3  . diltiazem (CARDIZEM CD) 120 MG 24 hr capsule TAKE ONE CAPSULE BY MOUTH ONCE DAILY 90 capsule 1  . diltiazem (TIAZAC) 120 MG 24  hr capsule Take 1 capsule (120 mg total) by mouth daily. 90 capsule 3  . ergocalciferol (VITAMIN D2) 50000 UNITS capsule Take 1 capsule (50,000 Units total) by mouth once a week. 12 capsule 0  . furosemide (LASIX) 80 MG tablet TAKE ONE TABLET BY MOUTH TWICE DAILY 180 tablet 5  . glipiZIDE (GLUCOTROL XL) 5 MG 24 hr tablet Take 1 tablet (5 mg total) by mouth daily. 30 tablet 11  . glucose blood test strip Test blood sugar once daily and as needed for DM 250.0 100 each 3  . Lancets (ONETOUCH ULTRASOFT) lancets Test blood sugar once daily and as needed for DM 250.0     . loratadine (CLARITIN) 10 MG tablet OTC as directed.     . metFORMIN (GLUCOPHAGE) 1000 MG tablet  Take 1 tablet (1,000 mg total) by mouth 2 (two) times daily with a meal. 180 tablet 3  . mometasone (ELOCON) 0.1 % cream Tiny amount to each ear canal twice weekly as needed.    . mometasone (NASONEX) 50 MCG/ACT nasal spray PLACE 2 SPRAYS INTO THE NOSE DAILY. 17 g 5  . Multiple Vitamin (MULTIVITAMIN) capsule Take 1 capsule by mouth daily.      . mupirocin ointment (BACTROBAN) 2 % APPLY TO AFFECTED AREA TWICE A DAY 22 g 4  . omega-3 acid ethyl esters (LOVAZA) 1 G capsule Take by mouth 2 (two) times daily.    . Omega-3 Fatty Acids (FISH OIL PO) Take 1 tablet by mouth daily.    Marland Kitchen omeprazole (PRILOSEC) 20 MG capsule Take 20 mg by mouth 2 (two) times daily.     . potassium chloride SA (KLOR-CON M20) 20 MEQ tablet TAKE 2 TABS BY MOUTH ONCE DAILY 60 tablet 11  . simvastatin (ZOCOR) 20 MG tablet Take 1 tablet (20 mg total) by mouth at bedtime. 30 tablet 11  . SYMBICORT 160-4.5 MCG/ACT inhaler INHALE 2 PUFFS INTO THE LUNGS 2 (TWO) TIMES DAILY. 10.2 Inhaler 5  . warfarin (COUMADIN) 5 MG tablet TAKE AS DIRECTED BY THE ANTICOAGULATION CLINIC 30 tablet 0   No current facility-administered medications on file prior to visit.      Review of Systems Review of Systems  Constitutional: Negative for fever, appetite change,  and unexpected weight change. pos for financial difficulties re: visits and affording meds  Eyes: Negative for pain and visual disturbance.  Respiratory: pos for cough and shortness of breath.   Cardiovascular: Negative for cp or palpitations    Gastrointestinal: Negative for nausea, diarrhea and constipation.  Genitourinary: Negative for urgency and frequency.  Skin: Negative for pallor or rash   Neurological: Negative for weakness, light-headedness, numbness and headaches.  Hematological: Negative for adenopathy. Does not bruise/bleed easily.  Psychiatric/Behavioral: Negative for dysphoric mood. The patient is not nervous/anxious.         Objective:   Physical Exam    Constitutional: She appears well-developed and well-nourished. No distress.  Morbidly obese and no longer sob at rest with 02 by Lee at 4 L  HENT:  Head: Normocephalic and atraumatic.  Right Ear: External ear normal.  Left Ear: External ear normal.  Mouth/Throat: Oropharynx is clear and moist.  Nares are injected and congested  No sinus tenderness Clear rhinorrhea and post nasal drip   Eyes: Conjunctivae and EOM are normal. Pupils are equal, round, and reactive to light. Right eye exhibits no discharge. Left eye exhibits no discharge.  Neck: Normal range of motion. Neck supple.  Cardiovascular: Normal rate and normal heart sounds.  Pulmonary/Chest: Effort normal. No respiratory distress. She has wheezes. She has no rales. She exhibits no tenderness.  Harsh bs with wheeze diffusely Scattered rhonchi No rales    Lymphadenopathy:    She has no cervical adenopathy.  Neurological: She is alert. No cranial nerve deficit.  Skin: Skin is warm and dry. No rash noted.  Psychiatric: She has a normal mood and affect.          Assessment & Plan:   Problem List Items Addressed This Visit      Respiratory   Acute bronchitis with COPD (HCC)    Hypoxia resolved with 02 after sitting several minutes Enc to wear 02 ATC Depo medrol 40 IM today  Then prednisone taper (aware this will raise blood sugar) Cover with doxycycline  Disc symptomatic care - see instructions on AVS  Update if not starting to improve in a week or if worsening   Stay on current symbicort as able in light of financial difficulties       Relevant Medications   guaiFENesin-codeine (ROBITUSSIN AC) 100-10 MG/5ML syrup   predniSONE (DELTASONE) 10 MG tablet   methylPREDNISolone acetate (DEPO-MEDROL) injection 40 mg (Completed)     Endocrine   Diabetes type 2, controlled (HCC) - Primary    A1C today  Has not been able to come in due to financial limitations Still morbidly obese      Relevant Orders   Hemoglobin  A1c (Completed)   Hypothyroidism    Lab today and refill levothyroxine      Relevant Orders   TSH (Completed)     Other   Morbid obesity (HCC)    Discussed how this problem influences overall health and the risks it imposes  Reviewed plan for weight loss with lower calorie diet (via better food choices and also portion control or program like weight watchers) and exercise building up to or more than 30 minutes 5 days per week including some aerobic activity   Limited with exercise currently due to size and copd       Other Visit Diagnoses    SOB (shortness of breath)       Relevant Medications   methylPREDNISolone acetate (DEPO-MEDROL) injection 40 mg (Completed)

## 2016-11-13 NOTE — Progress Notes (Signed)
Pre visit review using our clinic review tool, if applicable. No additional management support is needed unless otherwise documented below in the visit note. 

## 2016-11-13 NOTE — Patient Instructions (Signed)
Pre visit review using our clinic review tool, if applicable. No additional management support is needed unless otherwise documented below in the visit note.  INR today 1.9  Patient presents today with significant SOB, wheeze and diminished lung sounds on auscultation.  She is afebrile but desats down to 81% off of O2 and 86% on 4L of O2.  Face is flushed and she is exhibiting signs of respiratory distress.  Denies chest pain, sore throat or head congestion but does have non productive tight cough.  Vs:  134/60, 81, 16 temp: 97.6.  Denies any current problems with edema but does state 2 weeks ago experienced brief period of significant swelling that has subsided.  Denies body aches, chills or GI issues.  Did speak with Dr. Milinda Antisower (pcp) who added patient to her schedule this afternoon for further eval.  After visit, antibiotic (doxycycline) was given and dosing and recheck of coumadin adjusted to avoid boost today, continue same dosing (1 pill daily except for 1/2 on Fridays) and recheck in 7- 10days.  Recheck set up for 11/23/26.  Physician spoke with patient regarding the importance of keeping appointments for management safety.

## 2016-11-14 ENCOUNTER — Telehealth: Payer: Self-pay | Admitting: *Deleted

## 2016-11-14 MED ORDER — LEVOTHYROXINE SODIUM 175 MCG PO TABS: ORAL_TABLET | ORAL | 1 refills | Status: DC

## 2016-11-14 NOTE — Telephone Encounter (Signed)
-----   Message from Judy PimpleMarne A [name], MD sent at 11/14/2016  8:17 AM EST ----- Thyroid stable Please refill levothyroxine at current dose for 90 day supply  DM is better controlled- good !

## 2016-11-14 NOTE — Telephone Encounter (Signed)
Pt notified of lab results and Dr. Royden Purlower's comments. Rx sent to pharmacy and pt aware

## 2016-11-15 DIAGNOSIS — J209 Acute bronchitis, unspecified: Secondary | ICD-10-CM | POA: Insufficient documentation

## 2016-11-15 DIAGNOSIS — J440 Chronic obstructive pulmonary disease with acute lower respiratory infection: Principal | ICD-10-CM

## 2016-11-15 NOTE — Assessment & Plan Note (Signed)
A1C today  Has not been able to come in due to financial limitations Still morbidly obese

## 2016-11-15 NOTE — Assessment & Plan Note (Signed)
Lab today and refill levothyroxine

## 2016-11-15 NOTE — Assessment & Plan Note (Signed)
Hypoxia resolved with 02 after sitting several minutes Enc to wear 02 ATC Depo medrol 40 IM today  Then prednisone taper (aware this will raise blood sugar) Cover with doxycycline  Disc symptomatic care - see instructions on AVS  Update if not starting to improve in a week or if worsening   Stay on current symbicort as able in light of financial difficulties

## 2016-11-15 NOTE — Assessment & Plan Note (Signed)
Discussed how this problem influences overall health and the risks it imposes  Reviewed plan for weight loss with lower calorie diet (via better food choices and also portion control or program like weight watchers) and exercise building up to or more than 30 minutes 5 days per week including some aerobic activity   Limited with exercise currently due to size and copd

## 2016-11-23 ENCOUNTER — Ambulatory Visit: Payer: Self-pay

## 2016-11-23 ENCOUNTER — Telehealth: Payer: Self-pay

## 2016-11-23 NOTE — Telephone Encounter (Signed)
Please cal her and tell her it is mandatory to check it (for safety reasons) -otherwise we cannot continue the coumadin

## 2016-11-23 NOTE — Telephone Encounter (Signed)
FYI  Patient called this week and cancelled coumadin appointment today to follow up since recently being started on an antibiotic.  She cancelled appointment despite being informed of recheck necessity to continue to manage coumadin safely.    She does not have another coumadin check scheduled until 12/21/16.

## 2016-11-28 NOTE — Telephone Encounter (Signed)
Spoke with patient this am regarding missing her appointment last week.  Patient states that she never took the antibiotic and cancelled her appointment.  She will keep her routine check on 12/21/16.  Thanks.

## 2016-12-07 ENCOUNTER — Other Ambulatory Visit: Payer: Self-pay

## 2016-12-07 MED ORDER — WARFARIN SODIUM 5 MG PO TABS: ORAL_TABLET | ORAL | 0 refills | Status: DC

## 2016-12-07 NOTE — Telephone Encounter (Signed)
Patient requests refill on coumadin.  We discussed patient's need for compliance and she verbalizes agreement to keep appointment.    Will give her a 3 months supply only and will carefully monitor patient's ability to keep appointments and safely manage coumadin before more will be given.

## 2016-12-17 ENCOUNTER — Telehealth: Payer: Self-pay

## 2016-12-17 NOTE — Telephone Encounter (Signed)
Pt notified of Dr. Royden Purlower's recommendations and verbalized understanding. Pt said she can't come in until Friday at the earliest. appt scheduled and pt was advise if sxs worsen or she gets SOB or develops any new sxs to go to ER or UC and let us know asap, pt verbalized understanding

## 2016-12-17 NOTE — Telephone Encounter (Signed)
Pt left v/m; pt having pain from lower rt back across groin and down to the shin; pt has problems with arthritis and hx of sciatica; rt leg also swollen.pt request prednisone 10 mg to take daily until pain and swelling in leg is under control. Pt request cb. [facility] [place].

## 2016-12-17 NOTE — Telephone Encounter (Signed)
Most of these symptoms go along with sciatica but the leg swelling does not -that could be something else  Is just one of her legs swollen?  Any redness or skin breakdown noted? Any rash ?

## 2016-12-17 NOTE — Telephone Encounter (Signed)
Pt said that her right leg is really swollen, she said that this has happened in the past for years due to her sciatica and arthritis, pt said the prednisone that you prescribed in Feb helped her leg swelling too. Pt said it's just the right leg that's swollen pretty bad and she can feel a muscle pulling in her groin area too. Pt said that she doesn't have any redness, no skin breakdown, and no rash

## 2016-12-17 NOTE — Telephone Encounter (Signed)
I need to make sure this does not look like a blood clot- so please have her come in for a visit so I can examine her and we can get an INR (last one was close to a month ago I think)

## 2016-12-21 ENCOUNTER — Ambulatory Visit (INDEPENDENT_AMBULATORY_CARE_PROVIDER_SITE_OTHER): Payer: Self-pay

## 2016-12-21 ENCOUNTER — Encounter (INDEPENDENT_AMBULATORY_CARE_PROVIDER_SITE_OTHER): Payer: Self-pay

## 2016-12-21 ENCOUNTER — Ambulatory Visit (INDEPENDENT_AMBULATORY_CARE_PROVIDER_SITE_OTHER): Payer: Self-pay | Admitting: Family Medicine

## 2016-12-21 ENCOUNTER — Encounter: Payer: Self-pay | Admitting: Family Medicine

## 2016-12-21 VITALS — BP 110/64 | HR 74 | Temp 98.4°F

## 2016-12-21 DIAGNOSIS — Z5181 Encounter for therapeutic drug level monitoring: Secondary | ICD-10-CM

## 2016-12-21 DIAGNOSIS — Z599 Problem related to housing and economic circumstances, unspecified: Secondary | ICD-10-CM

## 2016-12-21 DIAGNOSIS — R600 Localized edema: Secondary | ICD-10-CM

## 2016-12-21 DIAGNOSIS — S76219A Strain of adductor muscle, fascia and tendon of unspecified thigh, initial encounter: Secondary | ICD-10-CM | POA: Insufficient documentation

## 2016-12-21 DIAGNOSIS — I4891 Unspecified atrial fibrillation: Secondary | ICD-10-CM

## 2016-12-21 DIAGNOSIS — Z86718 Personal history of other venous thrombosis and embolism: Secondary | ICD-10-CM

## 2016-12-21 DIAGNOSIS — E039 Hypothyroidism, unspecified: Secondary | ICD-10-CM

## 2016-12-21 DIAGNOSIS — E7800 Pure hypercholesterolemia, unspecified: Secondary | ICD-10-CM

## 2016-12-21 DIAGNOSIS — S76211A Strain of adductor muscle, fascia and tendon of right thigh, initial encounter: Secondary | ICD-10-CM

## 2016-12-21 DIAGNOSIS — E119 Type 2 diabetes mellitus without complications: Secondary | ICD-10-CM

## 2016-12-21 DIAGNOSIS — G4733 Obstructive sleep apnea (adult) (pediatric): Secondary | ICD-10-CM | POA: Insufficient documentation

## 2016-12-21 DIAGNOSIS — Z598 Other problems related to housing and economic circumstances: Secondary | ICD-10-CM

## 2016-12-21 LAB — POCT INR: INR: 2.1

## 2016-12-21 MED ORDER — PREDNISONE 10 MG PO TABS: ORAL_TABLET | ORAL | 0 refills | Status: DC

## 2016-12-21 NOTE — Assessment & Plan Note (Signed)
Needs new cushion for face mask for cpap

## 2016-12-21 NOTE — Progress Notes (Signed)
Subjective:    Patient ID: Amy Bryant, female    DOB: 1955/11/06, 61 y.o.   MRN: [id number]  HPI Here for leg pain and swelling  Both legs swell more than usual   Cannot weigh pt today -not mobile to stand on scale   L leg swells more  She has slept more in the chair -not able to raise her legs as well as she used to  Also pulled a muscle in R groin and hip area -and this leg is swollen more she thinks due to this   Tries to watch her sodium   Took 3 prednisone and it helped a lot- pain and swelling   No skin redness or swelling  Does not think she has a clot    Pulse ox 92 on 02 today  Not more sob than usual    Due for INR today = for hx of PE in the past and a fib Lab Results  Component Value Date   INR 2.1 12/21/2016   INR 1.9 11/13/2016   INR 2.6 09/14/2016   She is unable to f/u for regular INRs due to lack of insurance /financial difficulties - this was discussed in detail   BP Readings from Last 3 Encounters:  12/21/16 110/64  11/13/16 138/60  09/14/16 128/76   She takes diltiazem     Chemistry      Component Value Date/Time   NA 140 07/06/2015 0743   K 4.3 07/06/2015 0743   CL 103 07/06/2015 0743   CO2 32 07/06/2015 0743   BUN 20 07/06/2015 0743   CREATININE 0.77 07/06/2015 0743      Component Value Date/Time   CALCIUM 11.5 (H) 07/06/2015 0743   CALCIUM 11.7 (H) 11/02/2010 2223   ALKPHOS 109 07/06/2015 0743   AST 20 07/06/2015 0743   ALT 30 07/06/2015 0743   BILITOT 0.4 07/06/2015 0743     she has not been able to afford to get regular labs and f/u -no insurance   Hypothyroidism  Pt has no clinical changes No change in energy level/ hair or skin/ edema and no tremor Lab Results  Component Value Date   TSH 2.88 11/13/2016     dm2 Lab Results  Component Value Date   HGBA1C 6.3 11/13/2016   Blood sugar has been very well controlled   Lab Results  Component Value Date   CHOL 152 07/06/2015   HDL 34.70 (L) 07/06/2015   LDLCALC  89 07/06/2015   LDLDIRECT 90.7 11/28/2012   TRIG 140.0 07/06/2015   CHOLHDL 4 07/06/2015   Patient Active Problem List   Diagnosis Date Noted  . OSA (obstructive sleep apnea) 12/21/2016  . Groin strain 12/21/2016  . Financial difficulties 09/16/2016  . Colon cancer screening 12/15/2013  . Encounter for therapeutic drug monitoring 11/05/2013  . Routine general medical examination at a health care facility 05/20/2012  . Atrial fibrillation (HCC) 11/23/2010  . History of pulmonary embolism 11/23/2010  . Vitamin D deficiency 11/15/2010  . DYSURIA, HX OF 08/19/2009  . PURE HYPERCHOLESTEROLEMIA 08/16/2009  . ATRIAL FIBRILLATION, HX OF 12/03/2008  . Diabetes type 2, controlled (HCC) 08/13/2008  . Hyperparathyroidism (HCC) 07/30/2008  . Hypothyroidism 02/13/2008  . Morbid obesity (HCC) 02/04/2008  . REACTIVE AIRWAY DISEASE 02/04/2008  . GERD 02/04/2008  . DISC DISEASE, LUMBAR 02/04/2008  . PULMONARY EMBOLISM, HX OF 02/04/2008  . TOBACCO USE, QUIT 02/04/2008  . OBESITY HYPOVENTILATION SYNDROME 08/11/2007  . COR PULMONALE 08/11/2007   Past Medical  History:  Diagnosis Date  . Atrial fibrillation (HCC)    with cardioversion success  . Cor pulmonale (HCC)    and obesity hypoventilation syndrome  . GERD (gastroesophageal reflux disease)   . Hypothyroid   . Morbid obesity (HCC)   . OA (osteoarthritis)   . Pulmonary embolism (HCC)    hx of on coumadin  . Reactive airways dysfunction syndrome    Past Surgical History:  Procedure Laterality Date  . TONSILLECTOMY     Social History  Substance Use Topics  . Smoking status: Former Games developer  . Smokeless tobacco: Not on file  . Alcohol use No   Family History  Problem Relation Age of Onset  . Heart disease Father 4    MI  . Diabetes Brother    Allergies  Allergen Reactions  . Augmentin [Amoxicillin-Pot Clavulanate] Other (See Comments)    Rash itching  . Pseudoephedrine     REACTION: tightness in chest   Current Outpatient  Prescriptions on File Prior to Visit  Medication Sig Dispense Refill  . albuterol (PROVENTIL) (2.5 MG/3ML) 0.083% nebulizer solution USE ONE VIAL IN NEBULIZER EVERY 4 HOURS AS NEEDED FOR WHEEZING 150 mL 5  . cholecalciferol (VITAMIN D) 1000 UNITS tablet Take 4,000 Units by mouth daily.    . ciclopirox (LOPROX) 0.77 % cream APPLY TO AFFECTED AREA TWICE A DAY AS NEEDED 90 g 0  . diclofenac (VOLTAREN) 75 MG EC tablet TAKE 1 TABLET BY MOUTH TWICE A DAY AS NEEDED FOR PAIN WITH FOOD 180 tablet 3  . diltiazem (CARDIZEM CD) 120 MG 24 hr capsule TAKE ONE CAPSULE BY MOUTH ONCE DAILY 90 capsule 1  . diltiazem (TIAZAC) 120 MG 24 hr capsule Take 1 capsule (120 mg total) by mouth daily. 90 capsule 3  . ergocalciferol (VITAMIN D2) 50000 UNITS capsule Take 1 capsule (50,000 Units total) by mouth once a week. 12 capsule 0  . furosemide (LASIX) 80 MG tablet TAKE ONE TABLET BY MOUTH TWICE DAILY 180 tablet 5  . glipiZIDE (GLUCOTROL XL) 5 MG 24 hr tablet Take 1 tablet (5 mg total) by mouth daily. 30 tablet 11  . glucose blood test strip Test blood sugar once daily and as needed for DM 250.0 100 each 3  . guaiFENesin-codeine (ROBITUSSIN AC) 100-10 MG/5ML syrup Take 5 mLs by mouth 4 (four) times daily as needed for cough. 120 mL 0  . Lancets (ONETOUCH ULTRASOFT) lancets Test blood sugar once daily and as needed for DM 250.0     . levothyroxine (SYNTHROID, LEVOTHROID) 175 MCG tablet TAKE ONE TABLET BY MOUTH ONCE DAILY BEFORE  BREAKFAST 90 tablet 1  . loratadine (CLARITIN) 10 MG tablet OTC as directed.     . metFORMIN (GLUCOPHAGE) 1000 MG tablet Take 1 tablet (1,000 mg total) by mouth 2 (two) times daily with a meal. 180 tablet 3  . mometasone (ELOCON) 0.1 % cream Tiny amount to each ear canal twice weekly as needed.    . mometasone (NASONEX) 50 MCG/ACT nasal spray PLACE 2 SPRAYS INTO THE NOSE DAILY. 17 g 5  . Multiple Vitamin (MULTIVITAMIN) capsule Take 1 capsule by mouth daily.      . mupirocin ointment (BACTROBAN) 2 %  APPLY TO AFFECTED AREA TWICE A DAY 22 g 4  . omega-3 acid ethyl esters (LOVAZA) 1 G capsule Take by mouth 2 (two) times daily.    . Omega-3 Fatty Acids (FISH OIL PO) Take 1 tablet by mouth daily.    Marland Kitchen omeprazole (PRILOSEC) 20 MG  capsule Take 20 mg by mouth 2 (two) times daily.     . potassium chloride SA (KLOR-CON M20) 20 MEQ tablet TAKE 2 TABS BY MOUTH ONCE DAILY 60 tablet 11  . predniSONE (DELTASONE) 10 MG tablet Take 2 pills once daily by mouth for 3 days then 1 pill daily for 3 days then stop 9 tablet 0  . simvastatin (ZOCOR) 20 MG tablet Take 1 tablet (20 mg total) by mouth at bedtime. 30 tablet 11  . SYMBICORT 160-4.5 MCG/ACT inhaler INHALE 2 PUFFS INTO THE LUNGS 2 (TWO) TIMES DAILY. 10.2 Inhaler 5  . warfarin (COUMADIN) 5 MG tablet TAKE AS DIRECTED BY THE ANTICOAGULATION CLINIC 90 tablet 0   No current facility-administered medications on file prior to visit.     Review of Systems Review of Systems  Constitutional: Negative for fever, appetite change,  and unexpected weight change.  Eyes: Negative for pain and visual disturbance.  ENT pos for poor seal with cpap mask/ needs s new one  Respiratory: Negative for cough and shortness of breath.  (breathing is stable with 02 and also cpap at night)  Cardiovascular: Negative for cp or palpitations   pos for ongoing pedal edema  Gastrointestinal: Negative for nausea, diarrhea and constipation.  Genitourinary: Negative for urgency and frequency.  Skin: Negative for pallor or rash  neg for redness of legs  MSK pos for R leg and groin pain  Neurological: Negative for weakness, light-headedness, numbness and headaches.  Hematological: Negative for adenopathy. Does not bruise/bleed easily.  Psychiatric/Behavioral: Negative for dysphoric mood. The patient is not nervous/anxious.  pos for significant financial worries and inability to pay for medical visits        Objective:   Physical Exam  Constitutional: She appears well-developed and  well-nourished. No distress.  Morbidly obese in wheelchair (mobility impaired)    HENT:  Head: Normocephalic and atraumatic.  Mouth/Throat: Oropharynx is clear and moist.  Eyes: Conjunctivae and EOM are normal. Pupils are equal, round, and reactive to light.  Neck: Normal range of motion. Neck supple. No JVD present. Carotid bruit is not present. No thyromegaly present.  Cardiovascular: Normal rate, normal heart sounds and intact distal pulses.  Exam reveals no gallop.   Pulmonary/Chest: Effort normal and breath sounds normal. No respiratory distress. She has no wheezes. She has no rales.  No crackles  Good air exch today with no wheezes or rhonchi   Abdominal: Soft. Bowel sounds are normal. She exhibits no distension, no abdominal bruit and no mass. There is no tenderness.  Musculoskeletal: She exhibits no edema.  Pain in R groin w/o evidence of hernia in exam (sitting however since unable to get on table) Pain with flex and int rot of R hip in groin area (per pt improved)  Baseline lymphedema with skin folds in both lower legs  No erythema or signs of infection  No focal palp cords or tenderness  Lymphadenopathy:    She has no cervical adenopathy.  Neurological: She is alert. She has normal reflexes.  Skin: Skin is warm and dry. No rash noted. No erythema.  No acute skin changes of legs bilat    Psychiatric: She has a normal mood and affect.  Pleasant  Voices concern over financial situation and worry          Assessment & Plan:   Problem List Items Addressed This Visit      Respiratory   OSA (obstructive sleep apnea)    Needs new cushion for face mask  for cpap        Endocrine   Diabetes type 2, controlled (HCC)    Well controlled with glipizide and metformin  Cannot afford regular f/u for this  Lab Results  Component Value Date   HGBA1C 6.3 11/13/2016   Disc imp of wt loss and more physical activity No low glucose levels      Hypothyroidism     Hypothyroidism  Pt has no clinical changes No change in energy level/ hair or skin/ edema and no tremor Lab Results  Component Value Date   TSH 2.88 11/13/2016            Musculoskeletal and Integument   Groin strain    Acute pain in R groin after twisting (no trauma)  Mobility impaired by obesity baseline Some pain on movement of R hip but she can move it  Enc getting up on walker more often -she agrees this would be helpful  Enc wt loss (pt is not very motivated)  She did start some prednisone at home- plan to px 20 mg taper to finish course (side eff disc)        Other   Financial difficulties    Pt cannot afford routine visits and checks of chronic medical problems  Struggles to pay for medicines  We will help any way we can  Stressed imp of regular INR checks-voiced understanding       Morbid obesity (HCC)    Unable to weigh due to mobility issues (primarily from obesity)  She is not very motivated to loose Not currently working and she has become less and less mobile at home-worrisome  Disc risks of her weight and need to cut calories and inc movement as much as possible  She does have to take prednisone intermittently -presenting another challenge       Pedal edema    Bilateral-lymphedema type Per pt much worse earlier this week and improved after taking some prednisone (this seems like it may not be linked)  Unable to lie flat so sleeping with feet down  Cannot tolerate supp hose (morbid obes and inability to get them on)  Enc elevation whenever sitting  Px new cpap mask so she can return to sleeping in normal bed Will update  Reassuring exam      PURE HYPERCHOLESTEROLEMIA    Disc goals for lipids and reasons to control them Rev labs with pt  (last) She cannot afford to check it currently  Rev low sat fat diet in detail  Continue simvastatin

## 2016-12-21 NOTE — Patient Instructions (Signed)
Pre visit review using our clinic review tool, if applicable. No additional management support is needed unless otherwise documented below in the visit note. 

## 2016-12-21 NOTE — Patient Instructions (Addendum)
Keep drinking water and avoiding sodium as much as you can  Walk more with your walker to get mobility back -this helps swelling also  When sitting-do everything you can to elevate legs to the level of your heart to help swelling   Take prednisone as directed   INR today

## 2016-12-21 NOTE — Progress Notes (Signed)
Pre visit review using our clinic review tool, if applicable. No additional management support is needed unless otherwise documented below in the visit note. 

## 2016-12-23 DIAGNOSIS — R600 Localized edema: Secondary | ICD-10-CM | POA: Insufficient documentation

## 2016-12-23 NOTE — Assessment & Plan Note (Signed)
Unable to weigh due to mobility issues (primarily from obesity)  She is not very motivated to loose Not currently working and she has become less and less mobile at home-worrisome  Disc risks of her weight and need to cut calories and inc movement as much as possible  She does have to take prednisone intermittently -presenting another challenge

## 2016-12-23 NOTE — Assessment & Plan Note (Signed)
Bilateral-lymphedema type Per pt much worse earlier this week and improved after taking some prednisone (this seems like it may not be linked)  Unable to lie flat so sleeping with feet down  Cannot tolerate supp hose (morbid obes and inability to get them on)  Enc elevation whenever sitting  Px new cpap mask so she can return to sleeping in normal bed Will update  Reassuring exam

## 2016-12-23 NOTE — Assessment & Plan Note (Signed)
Disc goals for lipids and reasons to control them Rev labs with pt  (last) She cannot afford to check it currently  Rev low sat fat diet in detail  Continue simvastatin

## 2016-12-23 NOTE — Assessment & Plan Note (Signed)
Hypothyroidism  Pt has no clinical changes No change in energy level/ hair or skin/ edema and no tremor Lab Results  Component Value Date   TSH 2.88 11/13/2016

## 2016-12-23 NOTE — Assessment & Plan Note (Addendum)
Acute pain in R groin after twisting (no trauma)  Mobility impaired by obesity baseline Some pain on movement of R hip but she can move it  Enc getting up on walker more often -she agrees this would be helpful  Enc wt loss (pt is not very motivated)  She did start some prednisone at home- plan to px 20 mg taper to finish course (side eff disc)

## 2016-12-23 NOTE — Assessment & Plan Note (Signed)
INR today for warfarin

## 2016-12-23 NOTE — Assessment & Plan Note (Signed)
Well controlled with glipizide and metformin  Cannot afford regular f/u for this  Lab Results  Component Value Date   HGBA1C 6.3 11/13/2016   Disc imp of wt loss and more physical activity No low glucose levels

## 2016-12-23 NOTE — Assessment & Plan Note (Signed)
Pt cannot afford routine visits and checks of chronic medical problems  Struggles to pay for medicines  We will help any way we can  Stressed imp of regular INR checks-voiced understanding

## 2017-01-14 ENCOUNTER — Other Ambulatory Visit: Payer: Self-pay | Admitting: Family Medicine

## 2017-01-14 NOTE — Telephone Encounter (Signed)
Refill sent to pharmacy per Dr. [name]. 

## 2017-01-14 NOTE — Telephone Encounter (Signed)
Received refill electronically Last refill 06/12/16 #150 ml/5 Last office visit 12/21/16 See allergy/contraindication

## 2017-01-14 NOTE — Telephone Encounter (Signed)
Please refill for 6 mo 

## 2017-01-18 ENCOUNTER — Ambulatory Visit: Payer: Self-pay

## 2017-02-05 ENCOUNTER — Telehealth: Payer: Self-pay

## 2017-02-05 NOTE — Telephone Encounter (Signed)
This writer returns patient call to discuss needs for next coumadin clinic visit on 02/08/17.  Patient states that she is physically unable to safely get in and out of her house.  She does have plans to get her a wheelchair ramp built but is unsure when that will be done.  Patient is going to try and come to appointment on Friday and requests that I come out to the car to check her INR as her husband has recently been in and out of the hospital and is too weak to push her.  I understand and did reassure her that as long as he can come in and check her in, I would be more than happy to come out to her car and see her.  Patient thanks me and will do her best to make appointment.

## 2017-02-05 NOTE — Telephone Encounter (Signed)
Thanks for offering that to her

## 2017-02-07 NOTE — Telephone Encounter (Signed)
Patient calls to inform me that she will have to cancel her appointment tomorrow as she physically cannot get out of her house due to ambulatory issues.  She has fallen this week on an attempt and she is fearful of falling again.  Patient states she will call me the first of next week to keep me updated on when she will be able to leave her house.  Do we possibly need to consider a home monitoring device for her?  I am not sure what her insurance will cover but we can discuss this next week when MD returns.  I am open to anything that may increase patient compliance.  Thanks.

## 2017-02-08 ENCOUNTER — Ambulatory Visit: Payer: Self-pay

## 2017-02-14 NOTE — Telephone Encounter (Signed)
Dr. Milinda Antisower okay with moving forward to refer patient for [facility] INR monitoring.    I have left a message for patient at her home number to discuss this option and to check with her insurance for coverage.  Patient is to call back next week to discuss further.

## 2017-02-21 NOTE — Telephone Encounter (Signed)
Spoke with patient regarding [facility] home monitoring device.  Unfortunately, patient does not have any insurance coverage at the present.  The out of pocket cost of the machine is several thousand dollars and obviously is not an option until she has coverage again.  Patient did schedule an appointment with me on 02/26/17.  I will go out to her car to perform the visit as she is having significant difficulties with transfers at this time.  Hopefully, she will be able to keep this appointment and be more consistent.  If not, we will have to continue to weigh her safety moving forward.  Thanks.

## 2017-02-21 NOTE — Telephone Encounter (Signed)
Thanks for the update and effort

## 2017-02-26 ENCOUNTER — Ambulatory Visit (INDEPENDENT_AMBULATORY_CARE_PROVIDER_SITE_OTHER): Payer: Self-pay

## 2017-02-26 DIAGNOSIS — Z86711 Personal history of pulmonary embolism: Secondary | ICD-10-CM

## 2017-02-26 DIAGNOSIS — Z5181 Encounter for therapeutic drug level monitoring: Secondary | ICD-10-CM

## 2017-02-26 DIAGNOSIS — Z86718 Personal history of other venous thrombosis and embolism: Secondary | ICD-10-CM

## 2017-02-26 DIAGNOSIS — I4891 Unspecified atrial fibrillation: Secondary | ICD-10-CM

## 2017-02-26 LAB — POCT INR: INR: 2.7

## 2017-02-26 NOTE — Patient Instructions (Signed)
Pre visit review using our clinic review tool, if applicable. No additional management support is needed unless otherwise documented below in the visit note. 

## 2017-02-28 ENCOUNTER — Telehealth: Payer: Self-pay

## 2017-02-28 NOTE — Telephone Encounter (Signed)
Patient returned call and appt made for 04/09/17 at 11am.

## 2017-02-28 NOTE — Telephone Encounter (Signed)
Returned patient's call from yesterday requesting a need for her return INR check in 4-6 weeks.  I LM on mobile number to call back and let me know if either July 10th or 13th would work.

## 2017-03-18 ENCOUNTER — Other Ambulatory Visit: Payer: Self-pay | Admitting: Family Medicine

## 2017-03-19 ENCOUNTER — Other Ambulatory Visit: Payer: Self-pay

## 2017-03-19 MED ORDER — WARFARIN SODIUM 5 MG PO TABS: ORAL_TABLET | ORAL | 0 refills | Status: DC

## 2017-03-19 NOTE — Telephone Encounter (Signed)
Patient is almost out of coumadin and requests order to refill.  Patient has difficulty with keeping coumadin appointments and needs oversight.  Will refill a 90 day supply only with no additional refills.

## 2017-04-09 ENCOUNTER — Ambulatory Visit (INDEPENDENT_AMBULATORY_CARE_PROVIDER_SITE_OTHER): Payer: Self-pay

## 2017-04-09 DIAGNOSIS — Z86711 Personal history of pulmonary embolism: Secondary | ICD-10-CM

## 2017-04-09 DIAGNOSIS — I4891 Unspecified atrial fibrillation: Secondary | ICD-10-CM

## 2017-04-09 DIAGNOSIS — Z86718 Personal history of other venous thrombosis and embolism: Secondary | ICD-10-CM

## 2017-04-09 DIAGNOSIS — Z5181 Encounter for therapeutic drug level monitoring: Secondary | ICD-10-CM

## 2017-04-09 LAB — POCT INR: INR: 3.2

## 2017-04-09 NOTE — Patient Instructions (Signed)
Pre visit review using our clinic review tool, if applicable. No additional management support is needed unless otherwise documented below in the visit note. 

## 2017-05-13 ENCOUNTER — Other Ambulatory Visit: Payer: Self-pay | Admitting: Family Medicine

## 2017-05-21 ENCOUNTER — Ambulatory Visit: Payer: Self-pay

## 2017-05-28 ENCOUNTER — Other Ambulatory Visit: Payer: Self-pay | Admitting: Family Medicine

## 2017-05-28 ENCOUNTER — Ambulatory Visit: Payer: Self-pay

## 2017-05-28 NOTE — Telephone Encounter (Signed)
Rout to [facility] coumadin clinic nurse

## 2017-05-28 NOTE — Telephone Encounter (Signed)
Patient is compliant with close monitoring and frequent encouragement.  Will refill X 90 day supply only.

## 2017-06-04 ENCOUNTER — Ambulatory Visit: Payer: Self-pay

## 2017-06-04 ENCOUNTER — Telehealth: Payer: Self-pay

## 2017-06-04 NOTE — Telephone Encounter (Signed)
Aware. thanks

## 2017-06-04 NOTE — Telephone Encounter (Signed)
Husband calls to cancel wife's appointment with the coumadin clinic today.  States that her legs are swollen to the extent that she cannot walk.  Patient states that she has seen dr. Milinda Antisower recently for this to rule out blood clot and nothing further can be done (per patient).    I have rescheduled her for this Friday at 11:15am for INR check and informed patient that we have to get her in here this week for compliance and safety.  This is her 3rd cancel and reschedule appointment.  She was initially due on 05/21/17.  Patient verbalizes understanding and extensively apologizes for her difficulties with keeping appointments.

## 2017-06-07 ENCOUNTER — Ambulatory Visit (INDEPENDENT_AMBULATORY_CARE_PROVIDER_SITE_OTHER): Payer: Self-pay

## 2017-06-07 DIAGNOSIS — Z86718 Personal history of other venous thrombosis and embolism: Secondary | ICD-10-CM

## 2017-06-07 DIAGNOSIS — Z86711 Personal history of pulmonary embolism: Secondary | ICD-10-CM

## 2017-06-07 DIAGNOSIS — I4891 Unspecified atrial fibrillation: Secondary | ICD-10-CM

## 2017-06-07 DIAGNOSIS — Z5181 Encounter for therapeutic drug level monitoring: Secondary | ICD-10-CM

## 2017-06-07 LAB — POCT INR: INR: 2.8

## 2017-06-07 NOTE — Patient Instructions (Signed)
Pre visit review using our clinic review tool, if applicable. No additional management support is needed unless otherwise documented below in the visit note. 

## 2017-06-17 ENCOUNTER — Other Ambulatory Visit: Payer: Self-pay | Admitting: Family Medicine

## 2017-06-17 NOTE — Telephone Encounter (Signed)
Will refill electronically  

## 2017-06-17 NOTE — Telephone Encounter (Signed)
Last OV was an acute appt on 12/21/16, and no future appts., please advise

## 2017-07-15 ENCOUNTER — Telehealth: Payer: Self-pay

## 2017-07-15 ENCOUNTER — Other Ambulatory Visit: Payer: Self-pay | Admitting: Family Medicine

## 2017-07-15 MED ORDER — ALBUTEROL SULFATE (2.5 MG/3ML) 0.083% IN NEBU: INHALATION_SOLUTION | RESPIRATORY_TRACT | 99 refills | Status: DC

## 2017-07-15 NOTE — Telephone Encounter (Signed)
Rx called to pharmacy as instructed. Talked with pharmacist and made sure patient is to get 3 boxes at a time. Patient notified that rx has been called in per her request.

## 2017-07-15 NOTE — Telephone Encounter (Signed)
Pt has been using the albuterol neb solution and pt is getting 2 boxes each time and that does not last pt 30 days. Pt wants to know if could get 3 boxes to last for one month. [facility] [place].Please advise.

## 2017-07-15 NOTE — Telephone Encounter (Signed)
Please send in with 3 box amt  Refill for a year is fine

## 2017-08-16 ENCOUNTER — Ambulatory Visit: Payer: Self-pay

## 2017-08-19 ENCOUNTER — Other Ambulatory Visit: Payer: Self-pay | Admitting: Family Medicine

## 2017-08-20 ENCOUNTER — Ambulatory Visit (INDEPENDENT_AMBULATORY_CARE_PROVIDER_SITE_OTHER): Payer: Self-pay

## 2017-08-20 DIAGNOSIS — Z86718 Personal history of other venous thrombosis and embolism: Secondary | ICD-10-CM

## 2017-08-20 DIAGNOSIS — Z5181 Encounter for therapeutic drug level monitoring: Secondary | ICD-10-CM

## 2017-08-20 DIAGNOSIS — Z86711 Personal history of pulmonary embolism: Secondary | ICD-10-CM

## 2017-08-20 DIAGNOSIS — I4891 Unspecified atrial fibrillation: Secondary | ICD-10-CM

## 2017-08-20 LAB — POCT INR: INR: 2.2

## 2017-08-20 NOTE — Telephone Encounter (Signed)
Patient did show for coumadin check today.  Will refill for a 3 month supply.

## 2017-08-20 NOTE — Patient Instructions (Signed)
INR today 2.2  Patient is doing well on current coumadin regimen but has ongoing difficulty coming in to the office.  Currently she will continue taking 1 pill (5mg ) daily EXCEPT for 1/2 pill on Fridays and recheck in 6 weeks.

## 2017-08-29 ENCOUNTER — Other Ambulatory Visit: Payer: Self-pay | Admitting: *Deleted

## 2017-08-29 MED ORDER — GLIPIZIDE ER 5 MG PO TB24: 5.0000 mg | ORAL_TABLET | Freq: Every day | ORAL | 3 refills | Status: DC

## 2017-08-29 NOTE — Telephone Encounter (Signed)
No recent/future appts., please advise  

## 2017-09-09 ENCOUNTER — Other Ambulatory Visit: Payer: Self-pay | Admitting: Family Medicine

## 2017-09-11 NOTE — Telephone Encounter (Signed)
Called pt an advise her of Dr. Royden Purlower's comments. Pt is still on coumadin but she doesn't want to change to the voltaren gel because pt said she has been on the tablets for years with no issues. Pt said she has to take a tablet a day to help with all of her joint pain (hands, legs, feet), pt wants to keep taking the tablets and requested me to ask Dr. Milinda Antisower to keep her on the tablets since she hasn't had any problems with bleeding in the past.   Called pharmacy and they put Rx on hold until they hear back from our office.

## 2017-09-11 NOTE — Telephone Encounter (Signed)
I refilled this and then realized that she is on warfarin (unless she stopped it)  Please cancel it if she is on warfarin -check in with her  Has had issues coming in due to finances   Would be safer to do the gel than the oral (bleeding risk)  Thanks

## 2017-09-11 NOTE — Telephone Encounter (Signed)
Pt had an acute appt on 12/21/16, no future appts, last filled on 09/04/16 #180 tabs with 3 additional refills, please advise

## 2017-09-11 NOTE — Telephone Encounter (Signed)
Got it - as long as she knows the risks and also always take with food

## 2017-09-12 NOTE — Telephone Encounter (Signed)
Spoke to pt and advised per Dr [name].  

## 2017-09-17 ENCOUNTER — Other Ambulatory Visit: Payer: Self-pay | Admitting: Family Medicine

## 2017-09-17 NOTE — Telephone Encounter (Signed)
She has financial difficulty coming in  Refill for 3 months please

## 2017-09-17 NOTE — Telephone Encounter (Signed)
No recent or future appts., please advise  

## 2017-09-18 NOTE — Telephone Encounter (Signed)
done

## 2017-10-08 ENCOUNTER — Ambulatory Visit (INDEPENDENT_AMBULATORY_CARE_PROVIDER_SITE_OTHER): Payer: Self-pay

## 2017-10-08 DIAGNOSIS — Z86711 Personal history of pulmonary embolism: Secondary | ICD-10-CM

## 2017-10-08 DIAGNOSIS — Z86718 Personal history of other venous thrombosis and embolism: Secondary | ICD-10-CM

## 2017-10-08 DIAGNOSIS — Z5181 Encounter for therapeutic drug level monitoring: Secondary | ICD-10-CM

## 2017-10-08 DIAGNOSIS — I4891 Unspecified atrial fibrillation: Secondary | ICD-10-CM

## 2017-10-08 LAB — POCT INR: INR: 2.7

## 2017-10-08 NOTE — Patient Instructions (Signed)
INR today 2.7  Patient is doing well on current coumadin regimen, she will continue taking 1 pill (5mg ) daily EXCEPT for 1/2 pill on Fridays and recheck in 6-8 weeks.  No changes in diet, health or medications and has been compliant with recent coumadin check appointments.   .Marland Kitchen

## 2017-11-18 ENCOUNTER — Other Ambulatory Visit: Payer: Self-pay | Admitting: Family Medicine

## 2017-11-19 NOTE — Telephone Encounter (Signed)
Pt hasn't had TSH labs in over a year, [facility]

## 2017-11-19 NOTE — Telephone Encounter (Signed)
Patient has been keeping recent coumadin visit appointments.  Will refill her coumadin X 3 months only.  Appears that patient will need an office visit and labs scheduled before we can refill her thyroid medication.  Will fill X 1 month only for the thyroid medication and have pharmacy notify patient to schedule appointment before more can be given.

## 2017-11-19 NOTE — Telephone Encounter (Signed)
Perhaps she can coordinate her next visit with me with an INR day

## 2017-11-26 ENCOUNTER — Ambulatory Visit: Payer: Self-pay

## 2017-12-03 ENCOUNTER — Ambulatory Visit (INDEPENDENT_AMBULATORY_CARE_PROVIDER_SITE_OTHER): Payer: Self-pay

## 2017-12-03 ENCOUNTER — Encounter: Payer: Self-pay | Admitting: Family Medicine

## 2017-12-03 ENCOUNTER — Ambulatory Visit (INDEPENDENT_AMBULATORY_CARE_PROVIDER_SITE_OTHER): Payer: Self-pay | Admitting: Family Medicine

## 2017-12-03 ENCOUNTER — Ambulatory Visit: Payer: Self-pay | Admitting: Family Medicine

## 2017-12-03 VITALS — BP 126/72 | HR 61 | Temp 98.1°F

## 2017-12-03 DIAGNOSIS — R600 Localized edema: Secondary | ICD-10-CM

## 2017-12-03 DIAGNOSIS — Z86711 Personal history of pulmonary embolism: Secondary | ICD-10-CM

## 2017-12-03 DIAGNOSIS — R319 Hematuria, unspecified: Secondary | ICD-10-CM

## 2017-12-03 DIAGNOSIS — E039 Hypothyroidism, unspecified: Secondary | ICD-10-CM

## 2017-12-03 DIAGNOSIS — I279 Pulmonary heart disease, unspecified: Secondary | ICD-10-CM

## 2017-12-03 DIAGNOSIS — E662 Morbid (severe) obesity with alveolar hypoventilation: Secondary | ICD-10-CM

## 2017-12-03 DIAGNOSIS — E119 Type 2 diabetes mellitus without complications: Secondary | ICD-10-CM

## 2017-12-03 DIAGNOSIS — E7800 Pure hypercholesterolemia, unspecified: Secondary | ICD-10-CM

## 2017-12-03 DIAGNOSIS — Z86718 Personal history of other venous thrombosis and embolism: Secondary | ICD-10-CM

## 2017-12-03 DIAGNOSIS — E213 Hyperparathyroidism, unspecified: Secondary | ICD-10-CM

## 2017-12-03 DIAGNOSIS — Z5181 Encounter for therapeutic drug level monitoring: Secondary | ICD-10-CM

## 2017-12-03 DIAGNOSIS — I4891 Unspecified atrial fibrillation: Secondary | ICD-10-CM

## 2017-12-03 DIAGNOSIS — Z23 Encounter for immunization: Secondary | ICD-10-CM

## 2017-12-03 DIAGNOSIS — E559 Vitamin D deficiency, unspecified: Secondary | ICD-10-CM

## 2017-12-03 DIAGNOSIS — Z599 Problem related to housing and economic circumstances, unspecified: Secondary | ICD-10-CM

## 2017-12-03 DIAGNOSIS — Z598 Other problems related to housing and economic circumstances: Secondary | ICD-10-CM

## 2017-12-03 LAB — POC URINALSYSI DIPSTICK (AUTOMATED)
BILIRUBIN UA: NEGATIVE
Blood, UA: 200
Glucose, UA: NEGATIVE
KETONES UA: NEGATIVE
Leukocytes, UA: NEGATIVE
Nitrite, UA: NEGATIVE
PH UA: 6 (ref 5.0–8.0)
SPEC GRAV UA: 1.02 (ref 1.010–1.025)
Urobilinogen, UA: 0.2 E.U./dL

## 2017-12-03 LAB — COMPREHENSIVE METABOLIC PANEL
ALT: 12 U/L (ref 0–35)
AST: 10 U/L (ref 0–37)
Albumin: 4.1 g/dL (ref 3.5–5.2)
Alkaline Phosphatase: 85 U/L (ref 39–117)
BUN: 26 mg/dL — AB (ref 6–23)
CHLORIDE: 101 meq/L (ref 96–112)
CO2: 36 meq/L — AB (ref 19–32)
Calcium: 12.2 mg/dL — ABNORMAL HIGH (ref 8.4–10.5)
Creatinine, Ser: 0.88 mg/dL (ref 0.40–1.20)
GFR: 69.06 mL/min (ref >60.00)
GLUCOSE: 95 mg/dL (ref 70–99)
POTASSIUM: 4.5 meq/L (ref 3.5–5.1)
SODIUM: 140 meq/L (ref 135–145)
TOTAL PROTEIN: 7.6 g/dL (ref 6.0–8.3)
Total Bilirubin: 0.3 mg/dL (ref 0.2–1.2)

## 2017-12-03 LAB — HEMOGLOBIN A1C: Hgb A1c MFr Bld: 5.7 % (ref 4.6–6.5)

## 2017-12-03 LAB — CBC WITH DIFFERENTIAL/PLATELET
Basophils Absolute: 0 10*3/uL (ref 0.0–0.1)
Basophils Relative: 0.5 % (ref 0.0–3.0)
Eosinophils Absolute: 0.3 10*3/uL (ref 0.0–0.7)
Eosinophils Relative: 3.9 % (ref 0.0–5.0)
HCT: 36.2 % (ref 36.0–46.0)
Hemoglobin: 11.3 g/dL — ABNORMAL LOW (ref 12.0–15.0)
LYMPHS ABS: 0.8 10*3/uL (ref 0.7–4.0)
Lymphocytes Relative: 11.2 % — ABNORMAL LOW (ref 12.0–46.0)
MCHC: 31.2 g/dL (ref 30.0–36.0)
MCV: 92.9 fl (ref 78.0–100.0)
MONO ABS: 0.4 10*3/uL (ref 0.1–1.0)
MONOS PCT: 5.4 % (ref 3.0–12.0)
NEUTROS ABS: 5.6 10*3/uL (ref 1.4–7.7)
NEUTROS PCT: 79 % — AB (ref 43.0–77.0)
PLATELETS: 251 10*3/uL (ref 150.0–400.0)
RBC: 3.89 Mil/uL (ref 3.87–5.11)
RDW: 15.5 % (ref 11.5–15.5)
WBC: 7.1 10*3/uL (ref 4.0–10.5)

## 2017-12-03 LAB — LIPID PANEL
CHOL/HDL RATIO: 4
Cholesterol: 144 mg/dL (ref 0–200)
HDL: 39.4 mg/dL (ref >39.00)
LDL CALC: 86 mg/dL (ref 0–99)
NONHDL: 104.31
Triglycerides: 91 mg/dL (ref 0.0–149.0)
VLDL: 18.2 mg/dL (ref 0.0–40.0)

## 2017-12-03 LAB — VITAMIN D 25 HYDROXY (VIT D DEFICIENCY, FRACTURES): VITD: 41.88 ng/mL (ref 30.00–100.00)

## 2017-12-03 LAB — POCT INR: INR: 2.3

## 2017-12-03 LAB — MICROALBUMIN / CREATININE URINE RATIO
Creatinine,U: 59.9 mg/dL
MICROALB UR: 6.6 mg/dL — AB (ref 0.0–1.9)
Microalb Creat Ratio: 11.1 mg/g (ref 0.0–30.0)

## 2017-12-03 LAB — TSH: TSH: 4.57 u[IU]/mL — AB (ref 0.35–4.50)

## 2017-12-03 LAB — PHOSPHORUS: PHOSPHORUS: 2.5 mg/dL (ref 2.3–4.6)

## 2017-12-03 MED ORDER — FUROSEMIDE 80 MG PO TABS: 80.0000 mg | ORAL_TABLET | Freq: Two times a day (BID) | ORAL | 3 refills | Status: DC

## 2017-12-03 MED ORDER — LEVOTHYROXINE SODIUM 175 MCG PO TABS: ORAL_TABLET | ORAL | 3 refills | Status: DC

## 2017-12-03 MED ORDER — DILTIAZEM HCL ER COATED BEADS 120 MG PO CP24: 120.0000 mg | ORAL_CAPSULE | Freq: Every day | ORAL | 3 refills | Status: DC

## 2017-12-03 MED ORDER — SIMVASTATIN 20 MG PO TABS: 20.0000 mg | ORAL_TABLET | Freq: Every day | ORAL | 3 refills | Status: DC

## 2017-12-03 MED ORDER — POTASSIUM CHLORIDE CRYS ER 20 MEQ PO TBCR: EXTENDED_RELEASE_TABLET | ORAL | 3 refills | Status: DC

## 2017-12-03 MED ORDER — DICLOFENAC SODIUM 75 MG PO TBEC: DELAYED_RELEASE_TABLET | ORAL | 3 refills | Status: DC

## 2017-12-03 NOTE — Progress Notes (Signed)
Subjective:    Patient ID: Amy Bryant, female    DOB: Aug 03, 1956, 62 y.o.   MRN: [id number]  HPI Here for f/u of chronic health problems   Not doing much lately  No changes in how she feels   She does not have insurance and cannot afford to come in often   Has urinary symptoms= for a few months  She sees some blood in urine first thing in am  Hurts to urinate and occ discomfort  No bladder pain  No fever  Some nausea (but she stays nauseated chronically with dizziness)  ua has blood cells/no other findings  Results for orders placed or performed in visit on 12/03/17  POCT Urinalysis Dipstick (Automated)  Result Value Ref Range   Color, UA Yellow    Clarity, UA Cloudy    Glucose, UA Negative    Bilirubin, UA Negative    Ketones, UA Negative    Spec Grav, UA 1.020 1.010 - 1.025   Blood, UA 200 Ery/uL    pH, UA 6.0 5.0 - 8.0   Protein, UA 15 mg/dL    Urobilinogen, UA 0.2 0.2 or 1.0 E.U./dL   Nitrite, UA Negative    Leukocytes, UA Negative Negative    L ear is waxy- she digs in it occ /itchy  Needs a flu shot - will do today   Unable to get wt due to non amb status  Is morbidly obese   Pulse ox is 93% on 4L ov 02  Has obesity hypoventilation syndrome as well as hx of PE in the past (is on warfarin) Thinks it is about the same   afib-on warfarin No symptoms at all  Lab Results  Component Value Date   INR 2.3 12/03/2017   INR 2.7 10/08/2017   INR 2.2 08/20/2017    DM2 Lab Results  Component Value Date   HGBA1C 6.3 11/13/2016  at home 74-126 - thinks it stays well controlled  Diet is the same- good days and bad days / more carbs at times/ some sweets  Overdue for labs  Glipizide and metformin  Cannot afford eye exam unfortunately  Lab Results  Component Value Date   MICROALBUR 0.4 06/04/2013  due for this    Hyperparathyroid-could not afford surgery for this  Cannot afford dexa  Lab Results  Component Value Date   CALCIUM 11.5 (H) 07/06/2015   PHOS 1.9 (L) 06/11/2014  watching vit D level -last was 35 She continues to take D3    Hypothyroidism  No goiter but she gets occ neck muscle spasms  Pt has no clinical changes- but thinks it makes her intermittent depressed mood worse  No change in energy level/ hair or skin/ edema and no tremor Lab Results  Component Value Date   TSH 2.88 11/13/2016    Due for lab  Does not sleep well either  [facility]    Hyperlipidemia Lab Results  Component Value Date   CHOL 152 07/06/2015   HDL 34.70 (L) 07/06/2015   LDLCALC 89 07/06/2015   LDLDIRECT 90.7 11/28/2012   TRIG 140.0 07/06/2015   CHOLHDL 4 07/06/2015  is on simvastatin Does not watch diet   Patient Active Problem List   Diagnosis Date Noted  . Hematuria 12/03/2017  . Pedal edema 12/23/2016  . OSA (obstructive sleep apnea) 12/21/2016  . Groin strain 12/21/2016  . Financial difficulties 09/16/2016  . Colon cancer screening 12/15/2013  . Encounter for therapeutic drug monitoring  11/05/2013  . Routine general medical examination at a health care facility 05/20/2012  . Atrial fibrillation (HCC) 11/23/2010  . History of pulmonary embolism 11/23/2010  . Vitamin D deficiency 11/15/2010  . DYSURIA, HX OF 08/19/2009  . PURE HYPERCHOLESTEROLEMIA 08/16/2009  . ATRIAL FIBRILLATION, HX OF 12/03/2008  . Diabetes type 2, controlled (HCC) 08/13/2008  . Hyperparathyroidism (HCC) 07/30/2008  . Hypothyroidism 02/13/2008  . Morbid obesity (HCC) 02/04/2008  . REACTIVE AIRWAY DISEASE 02/04/2008  . GERD 02/04/2008  . DISC DISEASE, LUMBAR 02/04/2008  . PULMONARY EMBOLISM, HX OF 02/04/2008  . TOBACCO USE, QUIT 02/04/2008  . Obesity hypoventilation syndrome (HCC) 08/11/2007  . COR PULMONALE 08/11/2007   Past Medical History:  Diagnosis Date  . Atrial fibrillation (HCC)    with cardioversion success  . Cor pulmonale (HCC)    and obesity hypoventilation syndrome  . GERD (gastroesophageal reflux disease)   .  Hypothyroid   . Morbid obesity (HCC)   . OA (osteoarthritis)   . Pulmonary embolism (HCC)    hx of on coumadin  . Reactive airways dysfunction syndrome [facility])    Past Surgical History:  Procedure Laterality Date  . TONSILLECTOMY     Social History   Tobacco Use  . Smoking status: Former Games developer  . Smokeless tobacco: Former Engineer, water Use Topics  . Alcohol use: No    Alcohol/week: 0.0 oz  . Drug use: No   Family History  Problem Relation Age of Onset  . Heart disease Father 94       MI  . Diabetes Brother    Allergies  Allergen Reactions  . Augmentin [Amoxicillin-Pot Clavulanate] Other (See Comments)    Rash itching  . Pseudoephedrine     REACTION: tightness in chest   Current Outpatient Medications on File Prior to Visit  Medication Sig Dispense Refill  . albuterol (PROVENTIL) (2.5 MG/3ML) 0.083% nebulizer solution USE ONE VIAL IN NEBULIZER EVERY 4 HOURS AS NEEDED FOR WHEEZING 225 mL prn  . cholecalciferol (VITAMIN D) 1000 UNITS tablet Take 4,000 Units by mouth daily.    . ciclopirox (LOPROX) 0.77 % cream APPLY TO AFFECTED AREA TWICE A DAY AS NEEDED 90 g 0  . diltiazem (TIAZAC) 120 MG 24 hr capsule Take 1 capsule (120 mg total) by mouth daily. 90 capsule 3  . ergocalciferol (VITAMIN D2) 50000 UNITS capsule Take 1 capsule (50,000 Units total) by mouth once a week. 12 capsule 0  . glipiZIDE (GLUCOTROL XL) 5 MG 24 hr tablet Take 1 tablet (5 mg total) by mouth daily. 30 tablet 3  . glucose blood test strip Test blood sugar once daily and as needed for DM 250.0 100 each 3  . guaiFENesin-codeine (ROBITUSSIN AC) 100-10 MG/5ML syrup Take 5 mLs by mouth 4 (four) times daily as needed for cough. 120 mL 0  . Lancets (ONETOUCH ULTRASOFT) lancets Test blood sugar once daily and as needed for DM 250.0     . loratadine (CLARITIN) 10 MG tablet OTC as directed.     . metFORMIN (GLUCOPHAGE) 1000 MG tablet TAKE ONE TABLET BY MOUTH TWICE DAILY WITH  A  MEAL. 180 tablet 1  . mometasone  (ELOCON) 0.1 % cream Tiny amount to each ear canal twice weekly as needed.    . mometasone (NASONEX) 50 MCG/ACT nasal spray PLACE 2 SPRAYS INTO THE NOSE DAILY. 17 g 5  . Multiple Vitamin (MULTIVITAMIN) capsule Take 1 capsule by mouth daily.      . mupirocin ointment (BACTROBAN) 2 % APPLY  TO AFFECTED AREA TWICE A DAY 22 g 4  . omega-3 acid ethyl esters (LOVAZA) 1 G capsule Take by mouth 2 (two) times daily.    . Omega-3 Fatty Acids (FISH OIL PO) Take 1 tablet by mouth daily.    Marland Kitchen omeprazole (PRILOSEC) 20 MG capsule Take 20 mg by mouth 2 (two) times daily.     . SYMBICORT 160-4.5 MCG/ACT inhaler INHALE 2 PUFFS INTO THE LUNGS 2 (TWO) TIMES DAILY. 10.2 Inhaler 5  . warfarin (COUMADIN) 5 MG tablet  TAKE AS DIRECTED BY THE ANTICOAGULATION CLINIC. 90 tablet 0   No current facility-administered medications on file prior to visit.     Review of Systems  Constitutional: Positive for fatigue. Negative for activity change, appetite change, fever and unexpected weight change.  HENT: Negative for congestion, ear pain, rhinorrhea, sinus pressure and sore throat.   Eyes: Negative for pain, redness and visual disturbance.  Respiratory: Positive for shortness of breath. Negative for cough, wheezing and stridor.        Baseline sob is improved  Cardiovascular: Positive for leg swelling. Negative for chest pain and palpitations.  Gastrointestinal: Negative for abdominal pain, blood in stool, constipation and diarrhea.  Endocrine: Negative for polydipsia and polyuria.  Genitourinary: Negative for dysuria, frequency and urgency.  Musculoskeletal: Positive for arthralgias, back pain, gait problem and myalgias.  Skin: Negative for pallor and rash.  Allergic/Immunologic: Negative for environmental allergies.  Neurological: Negative for dizziness, syncope, numbness and headaches.  Hematological: Negative for adenopathy. Does not bruise/bleed easily.  Psychiatric/Behavioral: Positive for dysphoric mood and sleep  disturbance. Negative for decreased concentration, self-injury and suicidal ideas. The patient is not nervous/anxious.        Mood varies       Objective:   Physical Exam  Constitutional: She appears well-developed and well-nourished. No distress.  Morbidly obese and wheelchair bound  HENT:  Head: Normocephalic and atraumatic.  Right Ear: External ear normal.  Left Ear: External ear normal.  Nose: Nose normal.  Mouth/Throat: Oropharynx is clear and moist.  Dry flaking skin in L ear canal Nares are boggy-wearing [place] 02  Eyes: Conjunctivae and EOM are normal. Pupils are equal, round, and reactive to light. Right eye exhibits no discharge. Left eye exhibits no discharge. No scleral icterus.  Neck: Normal range of motion. Neck supple. No JVD present. Carotid bruit is not present. No thyromegaly present.  Cardiovascular: Normal rate, regular rhythm, normal heart sounds and intact distal pulses. Exam reveals no gallop.  Nl rhythm today  Pulmonary/Chest: Effort normal and breath sounds normal. No respiratory distress. She has no wheezes. She has no rales.  Diffusely distant bs No wheeze today    Abdominal: Soft. Bowel sounds are normal. She exhibits no distension and no mass. There is no tenderness.  Musculoskeletal: She exhibits no edema or tenderness.  Baseline lymphedema  No pitting or tenderness  Lymphadenopathy:    She has no cervical adenopathy.  Neurological: She is alert. She has normal reflexes. No cranial nerve deficit. She exhibits normal muscle tone. Coordination normal.  Skin: Skin is warm and dry. No rash noted. No erythema. No pallor.  Very dry skin Keratotic areas on legs over lymphedema  Thickened toe nails   Psychiatric: She has a normal mood and affect. Her behavior is normal. Her mood appears not anxious. Her affect is not blunt and not labile. She does not exhibit a depressed mood.  Mood is fairly positive today  Assessment & Plan:   Problem List  Items Addressed This Visit      Cardiovascular and Mediastinum   Atrial fibrillation (HCC)    Nl rhythm today  Continues anticoag No c/o      Relevant Medications   diltiazem (CARTIA XT) 120 MG 24 hr capsule   simvastatin (ZOCOR) 20 MG tablet   furosemide (LASIX) 80 MG tablet     Respiratory   Obesity hypoventilation syndrome (HCC)    Continues 02  Limited activity Enc wt loss         Endocrine   Diabetes type 2, controlled (HCC)    A1C today  Glucose readings have been well controlled microalb today  Cannot afford eye exam until she gets ins  On glipizide and metformin  No low sugar levels       Relevant Medications   simvastatin (ZOCOR) 20 MG tablet   Other Relevant Orders   CBC with Differential/Platelet (Completed)   Comprehensive metabolic panel (Completed)   Hemoglobin A1c (Completed)   Microalbumin / creatinine urine ratio (Completed)   Hyperparathyroidism (HCC)    Labs today  Some aches and pains Takes her vit D Cannot afford surgeon consult due to no ins  Cannot afford dexa either       Relevant Orders   Phosphorus (Completed)   Hypothyroidism - Primary    Hypothyroidism  Pt has no clinical changes No change in energy level/ hair or skin/ edema and no tremor Lab Results  Component Value Date   TSH 4.57 (H) 12/03/2017     Due for TSH today      Relevant Medications   levothyroxine (SYNTHROID, LEVOTHROID) 175 MCG tablet   Other Relevant Orders   TSH (Completed)     Other   Financial difficulties    Limited to one visit per year  Unable to see specialists or have screening tests done due to cost        Hematuria    On UA Pending cx        Relevant Orders   POCT Urinalysis Dipstick (Automated) (Completed)   CBC with Differential/Platelet (Completed)   Urine Culture   Morbid obesity (HCC)    Discussed how this problem influences overall health and the risks it imposes  Reviewed plan for weight loss with lower calorie diet (via  better food choices and also portion control or program like weight watchers) and exercise building up to or more than 30 minutes 5 days per week including some aerobic activity   Pt does not seem motivated (hopeless over situation) Disc starting with limiting sweets and carbs Walks short distances with walker Wt loss would help many of her problems       Pedal edema    Stable today  Enc leg elevation She does not tolerate supp hose      PURE HYPERCHOLESTEROLEMIA    Disc goals for lipids and reasons to control them Rev labs with pt (a year ago) Lab today  On simvastatin  Rev low sat fat diet in detail       Relevant Medications   diltiazem (CARTIA XT) 120 MG 24 hr capsule   simvastatin (ZOCOR) 20 MG tablet   furosemide (LASIX) 80 MG tablet   Other Relevant Orders   Comprehensive metabolic panel (Completed)   Lipid panel (Completed)   Vitamin D deficiency    Taking her D Also hyperparthyroid Level today      Relevant Orders   VITAMIN D 25 Hydroxy (Vit-D  Deficiency, Fractures) (Completed)    Other Visit Diagnoses    Chronic pulmonary heart disease (HCC)   (Chronic)     Relevant Medications   diltiazem (CARTIA XT) 120 MG 24 hr capsule   simvastatin (ZOCOR) 20 MG tablet   furosemide (LASIX) 80 MG tablet   Need for influenza vaccination       Relevant Orders   Flu Vaccine QUAD 6+ mos PF IM (Fluarix Quad PF) (Completed)

## 2017-12-03 NOTE — Patient Instructions (Signed)
INR today 2.3    Currently she will continue taking 1 pill (5mg ) daily EXCEPT for 1/2 pill on Fridays and recheck in 6 weeks. . Patient is doing well without any changes to diet, health or medications.  She has requested 7 weeks due to scheduling conflict in 6.  She is stable and okay for extension.

## 2017-12-03 NOTE — Patient Instructions (Addendum)
Let's get a urine culture to look for infection and I will treat with antibiotic as needed   Call [facility] and tell them to cancel the thyroid px  I printed a new one  Don't fill it until labs come back   I printed your other px as well   Try to eat a diabetic diet / Try to get most of your carbohydrates from produce (with the exception of white potatoes)  Eat less bread/pasta/rice/snack foods/cereals/sweets and other items from the middle of the grocery store (processed carbs) Also for cholesterol Avoid red meat/ fried foods/ egg yolks/ fatty breakfast meats/ butter, cheese and high fat dairy/ and shellfish    Flu shot today

## 2017-12-04 LAB — URINE CULTURE
MICRO NUMBER:: 90281769
Result:: NO GROWTH
SPECIMEN QUALITY: ADEQUATE

## 2017-12-04 NOTE — Assessment & Plan Note (Signed)
Disc goals for lipids and reasons to control them Rev labs with pt (a year ago) Lab today  On simvastatin  Rev low sat fat diet in detail

## 2017-12-04 NOTE — Assessment & Plan Note (Signed)
Nl rhythm today  Continues anticoag No c/o

## 2017-12-04 NOTE — Assessment & Plan Note (Signed)
Discussed how this problem influences overall health and the risks it imposes  Reviewed plan for weight loss with lower calorie diet (via better food choices and also portion control or program like weight watchers) and exercise building up to or more than 30 minutes 5 days per week including some aerobic activity   Pt does not seem motivated (hopeless over situation) Disc starting with limiting sweets and carbs Walks short distances with walker Wt loss would help many of her problems

## 2017-12-04 NOTE — Assessment & Plan Note (Signed)
A1C today  Glucose readings have been well controlled microalb today  Cannot afford eye exam until she gets ins  On glipizide and metformin  No low sugar levels

## 2017-12-04 NOTE — Assessment & Plan Note (Signed)
Continues 02  Limited activity Enc wt loss

## 2017-12-04 NOTE — Assessment & Plan Note (Signed)
On UA Pending cx

## 2017-12-04 NOTE — Assessment & Plan Note (Signed)
Stable today  Enc leg elevation She does not tolerate supp hose

## 2017-12-04 NOTE — Assessment & Plan Note (Signed)
Hypothyroidism  Pt has no clinical changes No change in energy level/ hair or skin/ edema and no tremor Lab Results  Component Value Date   TSH 4.57 (H) 12/03/2017     Due for TSH today

## 2017-12-04 NOTE — Assessment & Plan Note (Signed)
Taking her D Also hyperparthyroid Level today

## 2017-12-04 NOTE — Assessment & Plan Note (Signed)
Labs today  Some aches and pains Takes her vit D Cannot afford surgeon consult due to no ins  Cannot afford dexa either

## 2017-12-04 NOTE — Assessment & Plan Note (Signed)
Limited to one visit per year  Unable to see specialists or have screening tests done due to cost

## 2018-01-14 ENCOUNTER — Other Ambulatory Visit: Payer: Self-pay | Admitting: Family Medicine

## 2018-01-21 ENCOUNTER — Ambulatory Visit (INDEPENDENT_AMBULATORY_CARE_PROVIDER_SITE_OTHER): Payer: Self-pay

## 2018-01-21 DIAGNOSIS — I4891 Unspecified atrial fibrillation: Secondary | ICD-10-CM

## 2018-01-21 DIAGNOSIS — Z5181 Encounter for therapeutic drug level monitoring: Secondary | ICD-10-CM

## 2018-01-21 DIAGNOSIS — Z86718 Personal history of other venous thrombosis and embolism: Secondary | ICD-10-CM

## 2018-01-21 DIAGNOSIS — Z86711 Personal history of pulmonary embolism: Secondary | ICD-10-CM

## 2018-01-21 LAB — POCT INR: INR: 3.9

## 2018-01-21 NOTE — Patient Instructions (Signed)
INR today 3.9    Patient reports having increased acid reflux and has had GI upset (nausea, and diarrhea) recently.  She has also increased her Zantac use.  Patient is to hold coumadin today (4/23) and then decrease weekly dosing to 1 pill 5mg  daily EXCEPT for 1/2 pill on Mondays and Fridays.  Due to travel issues, patient requests recheck in 4 weeks.    She verbalizes understanding of all instructions given today and denies any abnormal bruising or bleeding.  In addition, she understands the risks associated with a supratherapeutic level and will go to the ER if any concerns develop.    .Marland Kitchen

## 2018-02-18 ENCOUNTER — Ambulatory Visit: Payer: Self-pay

## 2018-02-25 ENCOUNTER — Ambulatory Visit (INDEPENDENT_AMBULATORY_CARE_PROVIDER_SITE_OTHER): Payer: Self-pay

## 2018-02-25 DIAGNOSIS — Z86711 Personal history of pulmonary embolism: Secondary | ICD-10-CM

## 2018-02-25 DIAGNOSIS — Z86718 Personal history of other venous thrombosis and embolism: Secondary | ICD-10-CM

## 2018-02-25 DIAGNOSIS — Z5181 Encounter for therapeutic drug level monitoring: Secondary | ICD-10-CM

## 2018-02-25 DIAGNOSIS — I4891 Unspecified atrial fibrillation: Secondary | ICD-10-CM

## 2018-02-25 LAB — POCT INR: INR: 2.9 (ref 2.0–3.0)

## 2018-02-25 NOTE — Patient Instructions (Signed)
INR today 2.9    Patient is doing well without any concerns or complaints and no recent changes to diet health or medications.  She will continue taking  daily EXCEPT for 2.5mg  on Mondays and Fridays.  Patient cannot come before 7/9 for next recheck due to transportation issues.  Appointment made for that time and patient educated on risks and warning signs if becomes supratherapeutic, patient aware to notify me or go to ER if any concerning changes.    Amy Bryant

## 2018-03-10 ENCOUNTER — Other Ambulatory Visit: Payer: Self-pay | Admitting: Family Medicine

## 2018-04-08 ENCOUNTER — Ambulatory Visit: Payer: Self-pay

## 2018-04-15 ENCOUNTER — Ambulatory Visit (INDEPENDENT_AMBULATORY_CARE_PROVIDER_SITE_OTHER): Payer: Self-pay

## 2018-04-15 ENCOUNTER — Other Ambulatory Visit: Payer: Self-pay | Admitting: Family Medicine

## 2018-04-15 DIAGNOSIS — Z5181 Encounter for therapeutic drug level monitoring: Secondary | ICD-10-CM

## 2018-04-15 DIAGNOSIS — Z86718 Personal history of other venous thrombosis and embolism: Secondary | ICD-10-CM

## 2018-04-15 DIAGNOSIS — I4891 Unspecified atrial fibrillation: Secondary | ICD-10-CM

## 2018-04-15 DIAGNOSIS — Z86711 Personal history of pulmonary embolism: Secondary | ICD-10-CM

## 2018-04-15 LAB — POCT INR: INR: 3 (ref 2.0–3.0)

## 2018-04-15 NOTE — Patient Instructions (Signed)
INR today 3.0    Patient is doing well without any concerns or complaints and no recent changes to diet health or medications.  She will continue taking 5mg  daily EXCEPT for 2.5mg  on Mondays and Fridays. Patient requests recheck in 6 weeks due to transportation difficulties.   .Marland Kitchen

## 2018-04-17 ENCOUNTER — Telehealth: Payer: Self-pay | Admitting: Family Medicine

## 2018-04-17 ENCOUNTER — Other Ambulatory Visit: Payer: Self-pay | Admitting: *Deleted

## 2018-04-17 NOTE — Telephone Encounter (Signed)
Copied from CRM [id number]. Topic: Quick Communication - Rx Refill/Question >> Apr 17, 2018 11:57 AM Burchel, Abbi R wrote: Medication: warfarin (COUMADIN) 5 MG tablet  [facility] [address] - [place], [place] - [address]  [phone] (Phone) [phone] (Fax)  Pt states she contacted [facility] and they instructed her to call the office. Pt has 2 pills left.  Please call when the request is completed. Pt was advised that RX refills may take up to 3 business days.

## 2018-04-17 NOTE — Telephone Encounter (Signed)
Patient is compliant with coumadin management, will refill X 6 months. Refer to 7/16 refill request for approval

## 2018-04-17 NOTE — Telephone Encounter (Signed)
Patient is compliant with coumadin management, will refill X 6 months. Contacted patient via phone to notify her that r/x was sent in.

## 2018-05-27 ENCOUNTER — Ambulatory Visit: Payer: Self-pay

## 2018-06-03 ENCOUNTER — Ambulatory Visit: Payer: Self-pay

## 2018-06-12 ENCOUNTER — Ambulatory Visit: Payer: Self-pay

## 2018-06-13 ENCOUNTER — Telehealth: Payer: Self-pay

## 2018-06-13 NOTE — Telephone Encounter (Signed)
Patient called to apologize for no show yesterday for INR check but her husband has recently gotten out of the hospital and he couldn't bring her.  She rescheduled for Tuesday next week at 11am and states her son will bring her.    I noted while on the phone that patient was extremely short of breath and I know she has frequent acute exacerbations with lung disease and I am concerned.  I tried to get her to make an appointment with [name] or to seek medical attention sooner as needed.  She refused but stated that if she needs to see someone, she will call and schedule.  I will be seeing her Tuesday but she would not agree to an appointment with Dr. Milinda Antisower at this time.  I will forward this to Dr. Milinda Antisower as an Lorain ChildesFYI.

## 2018-06-13 NOTE — Telephone Encounter (Signed)
Thanks for letting me know - please update me when she comes  Agree with advisement

## 2018-06-16 ENCOUNTER — Other Ambulatory Visit: Payer: Self-pay | Admitting: Family Medicine

## 2018-06-17 ENCOUNTER — Ambulatory Visit: Payer: Self-pay

## 2018-06-20 ENCOUNTER — Ambulatory Visit (INDEPENDENT_AMBULATORY_CARE_PROVIDER_SITE_OTHER): Payer: Self-pay

## 2018-06-20 DIAGNOSIS — I4891 Unspecified atrial fibrillation: Secondary | ICD-10-CM

## 2018-06-20 DIAGNOSIS — Z5181 Encounter for therapeutic drug level monitoring: Secondary | ICD-10-CM

## 2018-06-20 DIAGNOSIS — Z86718 Personal history of other venous thrombosis and embolism: Secondary | ICD-10-CM

## 2018-06-20 DIAGNOSIS — Z86711 Personal history of pulmonary embolism: Secondary | ICD-10-CM

## 2018-06-20 LAB — POCT INR: INR: 2.7 (ref 2.0–3.0)

## 2018-06-20 NOTE — Patient Instructions (Signed)
INR today 2.7    Patient is without any concerns or complaints and no recent changes to diet or medications.  She is experiencing ongoing wheezing and SOB but again denies agreement to set up appt with Dr. Milinda Antisower to evaluate despite my encouragement. She states that she will call back and set up if concerned.    She will continue taking 5mg  daily EXCEPT for 2.5mg  on Mondays and Fridays. Patient requests recheck in 6 weeks due to transportation difficulties.   .Marland Kitchen

## 2018-07-29 ENCOUNTER — Ambulatory Visit: Payer: Self-pay

## 2018-08-13 ENCOUNTER — Telehealth: Payer: Self-pay | Admitting: Family Medicine

## 2018-08-13 MED ORDER — BUDESONIDE-FORMOTEROL FUMARATE 160-4.5 MCG/ACT IN AERO: INHALATION_SPRAY | RESPIRATORY_TRACT | 2 refills | Status: DC

## 2018-08-13 NOTE — Telephone Encounter (Signed)
° ° °  1. Which medications need to be refilled? (please list name of each medication and dose if known) Symbicort inhaler  2. Which pharmacy/location (including street and city if local pharmacy) is medication to be sent to?[facility]

## 2018-08-19 ENCOUNTER — Other Ambulatory Visit: Payer: Self-pay | Admitting: Family Medicine

## 2018-09-03 ENCOUNTER — Telehealth: Payer: Self-pay

## 2018-09-03 NOTE — Telephone Encounter (Signed)
Form placed on Mandy's desk

## 2018-09-03 NOTE — Telephone Encounter (Signed)
Patient is past due for INR check due to transportation and cost issues. She has been waiting on home monitoring device to be set up in her home.  Spoke with patient today.  Home device was not set up as I was waiting for her insurance to go in to effect.  Insurance in place now and I have moved forward with orders for in home meter.  As it could take up to another 3 weeks to initiate home monitoring, I have asked that patient come by the office for a check.    Patient will call me back and let me know when a good time to come by will be.   Paperwork for [employer] home monitoring device received and will have Dr. Milinda Antisower sign off and fax back to company with copy of insurance card.    FYI to Dr. Milinda Antisower.

## 2018-09-03 NOTE — Telephone Encounter (Signed)
Done and in IN box 

## 2018-09-05 NOTE — Telephone Encounter (Signed)
Pt said [facility] only gives 75 vials and no refills. [facility]; she spoke with pharmacist and said it was supposed to be 225 vials not mls and will chg rx and pt [facility]. Pt voiced understanding and will contact [facility] to have rx transferred to [facility].

## 2018-09-09 ENCOUNTER — Other Ambulatory Visit: Payer: Self-pay | Admitting: Family Medicine

## 2018-09-19 ENCOUNTER — Telehealth: Payer: Self-pay

## 2018-09-19 NOTE — Telephone Encounter (Signed)
I cannot treat without exam -esp during flu season (those symptoms are concerning)  If she is too sob to get to car- may need to call EMS

## 2018-09-19 NOTE — Telephone Encounter (Signed)
Pt states she is begging today. Pt said she cannot walk to car due to SOB when walks any distance.pt said on and off for 1 month had a cough; now has prod cough with thick green phlegm. Pt is SOB upon any exertion and wheezing some. No S/T or fever. Pt request abx and possibly a prednisone pak sent to [facility]. After Christmas if pt gets med pt will come in for appt. Pt request cb.

## 2018-09-19 NOTE — Telephone Encounter (Signed)
Pt notified of Dr. Royden Purlower's instructions and comments and I did recommend eval at ER. Pt verbalized understanding

## 2018-09-30 DIAGNOSIS — I480 Paroxysmal atrial fibrillation: Secondary | ICD-10-CM | POA: Diagnosis not present

## 2018-09-30 DIAGNOSIS — Z7901 Long term (current) use of anticoagulants: Secondary | ICD-10-CM | POA: Diagnosis not present

## 2018-10-02 ENCOUNTER — Ambulatory Visit (INDEPENDENT_AMBULATORY_CARE_PROVIDER_SITE_OTHER): Payer: Self-pay | Admitting: General Practice

## 2018-10-02 DIAGNOSIS — Z5181 Encounter for therapeutic drug level monitoring: Secondary | ICD-10-CM

## 2018-10-02 DIAGNOSIS — Z86711 Personal history of pulmonary embolism: Secondary | ICD-10-CM

## 2018-10-02 DIAGNOSIS — Z86718 Personal history of other venous thrombosis and embolism: Secondary | ICD-10-CM

## 2018-10-02 DIAGNOSIS — I4891 Unspecified atrial fibrillation: Secondary | ICD-10-CM

## 2018-10-02 LAB — POCT INR: INR: 3.8 — AB (ref 2.0–3.0)

## 2018-10-02 NOTE — Patient Instructions (Addendum)
Pre visit review using our clinic review tool, if applicable. No additional management support is needed unless otherwise documented below in the visit note.  Hold coumadin today and then continue to take 1 (5 mg) tablet daily except 1/2 tablet (2.5 mg) on Monday and Fridays.  Re-check in 1 week.  Patient monitors INR with [facility].  Patient verbalized understanding.

## 2018-10-03 ENCOUNTER — Other Ambulatory Visit: Payer: Self-pay

## 2018-10-03 MED ORDER — ALBUTEROL SULFATE (2.5 MG/3ML) 0.083% IN NEBU: INHALATION_SOLUTION | RESPIRATORY_TRACT | 1 refills | Status: DC

## 2018-10-03 NOTE — Telephone Encounter (Signed)
Pt request refill albuterol neb solution. Pt last seen 11/2017. Refilled per protocol to [facility]. Pt voiced understanding.

## 2018-10-07 ENCOUNTER — Ambulatory Visit: Payer: Self-pay

## 2018-10-07 DIAGNOSIS — Z86711 Personal history of pulmonary embolism: Secondary | ICD-10-CM

## 2018-10-07 DIAGNOSIS — Z5181 Encounter for therapeutic drug level monitoring: Secondary | ICD-10-CM

## 2018-10-07 DIAGNOSIS — Z86718 Personal history of other venous thrombosis and embolism: Secondary | ICD-10-CM

## 2018-10-07 DIAGNOSIS — I4891 Unspecified atrial fibrillation: Secondary | ICD-10-CM

## 2018-10-07 LAB — POCT INR: INR: 1.8 — AB (ref 2.0–3.0)

## 2018-10-07 NOTE — Patient Instructions (Signed)
INR TODAY 1.8  Patient just held her dose a few days ago. Likely contributing to the 1.8 today.  Patient is to continue on with her same dose and recheck at home using her home monitor in 1 week and call with results.  She is doing well with the home meter use and has had no recent changes in diet, health or medications.  Patient verbalized understanding.

## 2018-10-08 ENCOUNTER — Other Ambulatory Visit: Payer: Self-pay | Admitting: *Deleted

## 2018-10-08 MED ORDER — BUDESONIDE-FORMOTEROL FUMARATE 160-4.5 MCG/ACT IN AERO: INHALATION_SPRAY | RESPIRATORY_TRACT | 1 refills | Status: DC

## 2018-10-14 ENCOUNTER — Ambulatory Visit: Payer: Self-pay

## 2018-10-14 DIAGNOSIS — Z86711 Personal history of pulmonary embolism: Secondary | ICD-10-CM

## 2018-10-14 DIAGNOSIS — Z5181 Encounter for therapeutic drug level monitoring: Secondary | ICD-10-CM

## 2018-10-14 DIAGNOSIS — I4891 Unspecified atrial fibrillation: Secondary | ICD-10-CM

## 2018-10-14 DIAGNOSIS — Z86718 Personal history of other venous thrombosis and embolism: Secondary | ICD-10-CM

## 2018-10-14 LAB — POCT INR: INR: 2.4 (ref 2.0–3.0)

## 2018-10-14 NOTE — Patient Instructions (Addendum)
INR TODAY 2.4  Continue taking 5mg  daily EXCEPT for 2.5mg  on mondays and Fridays, recheck in 2 weeks.   Patient is doing well without any changes in diet, health or medications.

## 2018-10-15 ENCOUNTER — Telehealth: Payer: Self-pay | Admitting: Family Medicine

## 2018-10-15 MED ORDER — CICLOPIROX OLAMINE 0.77 % EX CREA: TOPICAL_CREAM | CUTANEOUS | 0 refills | Status: DC

## 2018-10-15 NOTE — Addendum Note (Signed)
Addended by: Roxy Manns A on: 10/15/2018 08:34 PM   Modules accepted: Orders

## 2018-10-15 NOTE — Telephone Encounter (Signed)
Pt called office requesting to get a prescription for the ciclopirox cream. She prefers the 90mg  due to the cost being equal with her insurance coverage. Pt is requesting a call. Best cb [phone]

## 2018-10-16 MED ORDER — CICLOPIROX OLAMINE 0.77 % EX CREA: TOPICAL_CREAM | CUTANEOUS | 0 refills | Status: DC

## 2018-10-16 NOTE — Addendum Note (Signed)
Addended by: Patience Musca on: 10/16/2018 02:24 PM   Modules accepted: Orders

## 2018-10-16 NOTE — Telephone Encounter (Addendum)
Pt left v/m requesting cb about refill to ciclopirox olamine cream 90 gm to [facility]. rx was sent to [facility]. I called [facility] to cancel rx and sent rx to [facility]. Pt notified and voiced understanding. Pt appreciative.

## 2018-10-28 DIAGNOSIS — Z7901 Long term (current) use of anticoagulants: Secondary | ICD-10-CM | POA: Diagnosis not present

## 2018-10-28 DIAGNOSIS — I480 Paroxysmal atrial fibrillation: Secondary | ICD-10-CM | POA: Diagnosis not present

## 2018-10-28 LAB — POCT INR: INR: 3.3 — AB (ref 2.0–3.0)

## 2018-10-29 ENCOUNTER — Ambulatory Visit: Payer: Self-pay

## 2018-10-29 ENCOUNTER — Telehealth: Payer: Self-pay

## 2018-10-29 DIAGNOSIS — Z86718 Personal history of other venous thrombosis and embolism: Secondary | ICD-10-CM

## 2018-10-29 DIAGNOSIS — Z5181 Encounter for therapeutic drug level monitoring: Secondary | ICD-10-CM

## 2018-10-29 DIAGNOSIS — Z86711 Personal history of pulmonary embolism: Secondary | ICD-10-CM

## 2018-10-29 DIAGNOSIS — I4891 Unspecified atrial fibrillation: Secondary | ICD-10-CM

## 2018-10-29 NOTE — Telephone Encounter (Signed)
Please refer to coag encounter for INR details and directions given.

## 2018-10-29 NOTE — Patient Instructions (Signed)
INR TODAY 3.3 *received 1/28 report on 1/29  Hold Coumadin today 1/28 and then resume taking 5mg  daily EXCEPT for 2.5mg  on mondays and Fridays, recheck in 1 week.   Patient complaining of not feeling well lately with increased SOB.  She has also not been eating very much lately and is using her breathing mask per her usual routine while I was on the phone with her.  She refuses to set up an appointment with Dr. [name] but states if she doesn't improve or get worse she will call back to get in with the doctor.  Patient verbalizes understanding of all instructions given today.

## 2018-11-06 ENCOUNTER — Ambulatory Visit: Payer: Self-pay

## 2018-11-06 ENCOUNTER — Other Ambulatory Visit: Payer: Self-pay | Admitting: Family Medicine

## 2018-11-06 DIAGNOSIS — Z86711 Personal history of pulmonary embolism: Secondary | ICD-10-CM

## 2018-11-06 DIAGNOSIS — Z86718 Personal history of other venous thrombosis and embolism: Secondary | ICD-10-CM

## 2018-11-06 DIAGNOSIS — I4891 Unspecified atrial fibrillation: Secondary | ICD-10-CM

## 2018-11-06 DIAGNOSIS — Z5181 Encounter for therapeutic drug level monitoring: Secondary | ICD-10-CM

## 2018-11-06 LAB — POCT INR: INR: 2.6 (ref 2.0–3.0)

## 2018-11-06 NOTE — Patient Instructions (Signed)
INR TODAY 2.6  Continue taking 5mg  daily EXCEPT for 2.5mg  on mondays and Fridays, recheck in 2 weeks.   Patient is doing well this week with improved respiratory symptoms.  She verbalizes understanding of all instructions.

## 2018-11-18 ENCOUNTER — Other Ambulatory Visit: Payer: Self-pay

## 2018-11-18 ENCOUNTER — Telehealth: Payer: Self-pay | Admitting: Family Medicine

## 2018-11-18 MED ORDER — WARFARIN SODIUM 5 MG PO TABS: ORAL_TABLET | ORAL | 1 refills | Status: DC

## 2018-11-18 NOTE — Telephone Encounter (Signed)
Pt is requesting a call from Leon. Pt states she took a whole pill and a half of the Warfarin. Pt wants to know what she should do. Please advise

## 2018-11-18 NOTE — Telephone Encounter (Signed)
Spoke with patient.  She took 7.5mg  yesterday instead of the prescribed 2.5mg  of her warfarin.  Today would normally be a 5mg  day, so I am having patient hold her dose today and then resume prior dosing tomorrow.   She will do her next INR home monitor check on Thursday per our previous plans.   Patient verbalizes understanding.

## 2018-11-18 NOTE — Telephone Encounter (Signed)
Patient needs refill on her coumadin.  She is compliant with coumadin management will refill x 6 months.

## 2018-11-20 ENCOUNTER — Ambulatory Visit (INDEPENDENT_AMBULATORY_CARE_PROVIDER_SITE_OTHER): Payer: Self-pay | Admitting: General Practice

## 2018-11-20 DIAGNOSIS — I4891 Unspecified atrial fibrillation: Secondary | ICD-10-CM

## 2018-11-20 DIAGNOSIS — Z86711 Personal history of pulmonary embolism: Secondary | ICD-10-CM

## 2018-11-20 DIAGNOSIS — Z86718 Personal history of other venous thrombosis and embolism: Secondary | ICD-10-CM

## 2018-11-20 DIAGNOSIS — Z5181 Encounter for therapeutic drug level monitoring: Secondary | ICD-10-CM

## 2018-11-20 LAB — POCT INR: INR: 2.1 (ref 2.0–3.0)

## 2018-11-20 NOTE — Patient Instructions (Signed)
Pre visit review using our clinic review tool, if applicable. No additional management support is needed unless otherwise documented below in the visit note.  Continue taking 5mg  daily EXCEPT for 2.5mg  on mondays and Fridays, recheck in 2 weeks.

## 2018-11-24 ENCOUNTER — Other Ambulatory Visit: Payer: Self-pay | Admitting: Family Medicine

## 2018-11-25 ENCOUNTER — Other Ambulatory Visit: Payer: Self-pay | Admitting: Family Medicine

## 2018-11-25 NOTE — Telephone Encounter (Signed)
No recent or future appts please advise  

## 2018-11-25 NOTE — Telephone Encounter (Signed)
No recent or future appts., please advise  

## 2018-11-25 NOTE — Telephone Encounter (Signed)
Please schedule a spring f/u appt and refill until then

## 2018-11-25 NOTE — Telephone Encounter (Signed)
Please schedule a spring f/u and refill until then 

## 2018-11-26 NOTE — Telephone Encounter (Signed)
Dr. [name] please see Carrie's message in Prev note

## 2018-11-26 NOTE — Telephone Encounter (Addendum)
-----   Message from Conni Elliot sent at 11/26/2018 11:55 AM EST ----- Regarding: RE: appt I spoke to patient and she said she can't walk and she doesn't have a wheelchair.  She doesn't know how she would get to the office.

## 2018-11-26 NOTE — Telephone Encounter (Addendum)
-----   Message from Carrie L Efird sent at 11/26/2018 11:55 AM EST ----- Regarding: RE: appt I spoke to patient and she said she can't walk and she doesn't have a wheelchair.  She doesn't know how she would get to the office.   

## 2018-11-26 NOTE — Telephone Encounter (Signed)
See prev note -thanks

## 2018-11-26 NOTE — Telephone Encounter (Signed)
Dr. [name] please see prev note

## 2018-11-26 NOTE — Telephone Encounter (Signed)
I cannot continue to refill regular medicines without visits unfortunately (as it is we were only checking diabetes yearly)  I understand it is difficult for her physically and financially  I will cc this to [facility] to see if she has any ideas re: transportation / financial help for walking device etc (she I think is familiar with pt's situation) Thanks

## 2018-11-28 MED ORDER — GLIPIZIDE ER 5 MG PO TB24: 5.0000 mg | ORAL_TABLET | Freq: Every day | ORAL | 0 refills | Status: DC

## 2018-11-28 NOTE — Telephone Encounter (Signed)
[facility] [address] [facility] to [place].  See new refill encounter with information and 63month scripts sent.

## 2018-11-28 NOTE — Telephone Encounter (Signed)
Routing to [place] who called pt, see other med refill for Dr. Royden Purl complete message

## 2018-11-28 NOTE — Telephone Encounter (Signed)
Spoke with patient and discussed care options.  She wants to continue seeing Dr. [name] and is not interested in Palliative support or Dr's making House Calls services.  Her challenge is that she needs a wheelchair and wheelchair van transportation to make it in and out of the office.  She has agreed to home health support options to help her with getting equipment and social work support to help navigate transportation issues to clinic.  I will consult with our rep at [facility] next week to see how we may be able to coordinate this.  They usually can initiate services without patient being seen by Doctor first as long as the patient has an appointment set up within a certain number of days after admission.    I will further work on details and iron out a plan that works for both patient and that provider is comfortable with moving forward.  In the meantime I will refill patients Glipizide and her Levothyroxine x 1 MONTH only to allow us time to navigate this and get her in for an appointment for continued care management. Patient is out of these medications currently.  Last OV 12/03/17.   FYI to Dr. [name] regarding plan.   

## 2018-11-28 NOTE — Telephone Encounter (Signed)
Pt stated she is returning call to nurse. Please call pt. °

## 2018-11-28 NOTE — Telephone Encounter (Signed)
Spoke with patient and discussed care options.  She wants to continue seeing Dr. [name] and is not interested in Palliative support or Dr's making House Calls services.  Her challenge is that she needs a wheelchair and wheelchair [name] transportation to make it in and out of the office.  She has agreed to home health support options to help her with getting equipment and social work support to help navigate transportation issues to clinic.  I will consult with our rep at [facility] next week to see how we may be able to coordinate this.  They usually can initiate services without patient being seen by Doctor first as long as the patient has an appointment set up within a certain number of days after admission.    I will further work on details and iron out a plan that works for both patient and that provider is comfortable with moving forward.  In the meantime I will refill patients Glipizide and her Levothyroxine x 1 MONTH only to allow [place] time to navigate this and get her in for an appointment for continued care management. Patient is out of these medications currently.  Last OV 12/03/17.   FYI to Dr. [name] regarding plan.

## 2018-11-29 NOTE — Telephone Encounter (Signed)
Thank you Mandy

## 2018-12-03 ENCOUNTER — Telehealth: Payer: Self-pay

## 2018-12-03 DIAGNOSIS — E662 Morbid (severe) obesity with alveolar hypoventilation: Secondary | ICD-10-CM

## 2018-12-03 DIAGNOSIS — E119 Type 2 diabetes mellitus without complications: Secondary | ICD-10-CM

## 2018-12-03 DIAGNOSIS — I279 Pulmonary heart disease, unspecified: Secondary | ICD-10-CM

## 2018-12-03 DIAGNOSIS — I4891 Unspecified atrial fibrillation: Secondary | ICD-10-CM

## 2018-12-03 NOTE — Telephone Encounter (Signed)
Patient has had further mobility decline and is unable to transfer in and out of the office unless has a wheelchair and w/c [name] transport.  She also has had further decline with chronic medical conditions and has been unable to travel to doctor's office for f/u and care.   Please refer to prior encounter notes for details.  Dr. [name] and I have spoke at length regarding patient needs and patient is in agreement with in home support services and assistance.  She has face to face visit with MD on 12/10/18 and we are in the process to get home health and DME arranged for her for transportation.  Patient will require [facility] nursing pt/ot and SW and will place orders per Dr. Royden Purl approval.

## 2018-12-04 ENCOUNTER — Other Ambulatory Visit: Payer: Self-pay | Admitting: Family Medicine

## 2018-12-04 NOTE — Telephone Encounter (Signed)
Thank you :)

## 2018-12-04 NOTE — Telephone Encounter (Signed)
Weston Brass with [facility] calls requesting adjustment to order to omit nursing but keep pt/ot and sw and they will get updated orders for Nursing on Monday after getting set up.  New orders placed with removal of nursing until Monday.

## 2018-12-08 ENCOUNTER — Telehealth: Payer: Self-pay

## 2018-12-08 MED ORDER — POTASSIUM CHLORIDE CRYS ER 20 MEQ PO TBCR: EXTENDED_RELEASE_TABLET | ORAL | 0 refills | Status: DC

## 2018-12-08 NOTE — Telephone Encounter (Signed)
Yes-she wanted to put this off due to stress issues with husband in the hospital  Will send this to [place] or [place]

## 2018-12-08 NOTE — Telephone Encounter (Signed)
Ben PT with [facility] said that he went to see pt to do [facility] PT eval and pt requested that he comeback 12/09/18.Ben request new start of care orders for [facility] PT for 12/09/18.Please advise.

## 2018-12-08 NOTE — Telephone Encounter (Signed)
Called pt to see why she cancelled appt she states she can't get to her appt due to her needing a wheelchair. She asks if Angelica Chessman will give her call regarding the status so she can reschedule appt.

## 2018-12-08 NOTE — Telephone Encounter (Signed)
Thanks for the update

## 2018-12-08 NOTE — Telephone Encounter (Signed)
Called patient to check on her.  She was crying and very upset, stating that her husband was admitted to the hospital and is not doing very well.  She cannot get to him and is very overwhelmed.    Her son will be home this afternoon to support her and help her keep contact with her husband.  Due to situation with husband and her fragile emotional state, I have told her that we will postpone her arrangements for transportation, wheelchair and OV with [name] until next week.  Patient verbalizes understanding that she knows she needs to come in within 30 days to continue care with Dr. [name] and to receive approval for ongoing home health services.   Also, she has informed me that she is out of her potassium.  I will give her the 1 months supply only to carry her through.  [facility] will [facility] [facility] per her request.  FYI to Dr. [name].

## 2018-12-09 DIAGNOSIS — S81801D Unspecified open wound, right lower leg, subsequent encounter: Secondary | ICD-10-CM | POA: Diagnosis not present

## 2018-12-09 DIAGNOSIS — E119 Type 2 diabetes mellitus without complications: Secondary | ICD-10-CM | POA: Diagnosis not present

## 2018-12-09 DIAGNOSIS — Z7984 Long term (current) use of oral hypoglycemic drugs: Secondary | ICD-10-CM | POA: Diagnosis not present

## 2018-12-09 DIAGNOSIS — I4891 Unspecified atrial fibrillation: Secondary | ICD-10-CM | POA: Diagnosis not present

## 2018-12-09 DIAGNOSIS — E662 Morbid (severe) obesity with alveolar hypoventilation: Secondary | ICD-10-CM | POA: Diagnosis not present

## 2018-12-09 DIAGNOSIS — E7800 Pure hypercholesterolemia, unspecified: Secondary | ICD-10-CM | POA: Diagnosis not present

## 2018-12-09 DIAGNOSIS — E039 Hypothyroidism, unspecified: Secondary | ICD-10-CM | POA: Diagnosis not present

## 2018-12-09 DIAGNOSIS — L89891 Pressure ulcer of other site, stage 1: Secondary | ICD-10-CM | POA: Diagnosis not present

## 2018-12-09 DIAGNOSIS — I279 Pulmonary heart disease, unspecified: Secondary | ICD-10-CM | POA: Diagnosis not present

## 2018-12-09 NOTE — Telephone Encounter (Signed)
Romeo Apple was given verbal order but he did advise me that he was at pt's house now and wanted to confirm that she has the face to face appt scheduled that is required. I checked and pt had appt scheduled but she cancelled it so I did advise him to encourage pt to call and reschedule that appt asap. FYI to Dr. [name] and Angelica Chessman

## 2018-12-09 NOTE — Telephone Encounter (Signed)
Ben PT with [facility] said that he saw pt and pt has a pressure ulcer on lt hamstring; pt is not ambulating; but pt needs face to face with Dr [name]. Romeo Apple said pt advised him she is going to schedule appt by coordinating with [facility]. Ben request cb when face to face appt is scheduled with Dr [name]. FYI to [place].

## 2018-12-09 NOTE — Telephone Encounter (Signed)
That is fine, thanks 

## 2018-12-09 NOTE — Telephone Encounter (Signed)
I had already talked to the patient yesterday and I have blocked for her for next Wednesday but haven't put her name in there yet.    I will call her on Monday and follow up with what's going on with her and her husband to make sure that the day and time work.  I have to coordinate Amy Bryant Niece transport and a loaner wheelchair for the visit, so that is why I am pending the confirmation on everything until we find out what is going to happen with her husband.

## 2018-12-09 NOTE — Telephone Encounter (Signed)
Spoke directly with Romeo Apple PT [facility] and gave him direction that we have appt blocked for patient for next Wednesday to see Dr. [name].    He informs me that patient will require nursing, pt/ot and in home aide as she is not performing ADL'S and has developed a pressure ulcer to her Right Thigh.  Verbal approval given for all services per okay with Dr. [name].

## 2018-12-10 ENCOUNTER — Ambulatory Visit: Payer: Self-pay | Admitting: Family Medicine

## 2018-12-10 ENCOUNTER — Encounter: Payer: Self-pay | Admitting: Family Medicine

## 2018-12-11 ENCOUNTER — Telehealth: Payer: Self-pay

## 2018-12-11 DIAGNOSIS — S81801D Unspecified open wound, right lower leg, subsequent encounter: Secondary | ICD-10-CM | POA: Diagnosis not present

## 2018-12-11 DIAGNOSIS — E662 Morbid (severe) obesity with alveolar hypoventilation: Secondary | ICD-10-CM | POA: Diagnosis not present

## 2018-12-11 DIAGNOSIS — I279 Pulmonary heart disease, unspecified: Secondary | ICD-10-CM | POA: Diagnosis not present

## 2018-12-11 DIAGNOSIS — Z7984 Long term (current) use of oral hypoglycemic drugs: Secondary | ICD-10-CM | POA: Diagnosis not present

## 2018-12-11 DIAGNOSIS — I4891 Unspecified atrial fibrillation: Secondary | ICD-10-CM | POA: Diagnosis not present

## 2018-12-11 DIAGNOSIS — E7800 Pure hypercholesterolemia, unspecified: Secondary | ICD-10-CM | POA: Diagnosis not present

## 2018-12-11 DIAGNOSIS — L89891 Pressure ulcer of other site, stage 1: Secondary | ICD-10-CM | POA: Diagnosis not present

## 2018-12-11 DIAGNOSIS — E119 Type 2 diabetes mellitus without complications: Secondary | ICD-10-CM | POA: Diagnosis not present

## 2018-12-11 DIAGNOSIS — E039 Hypothyroidism, unspecified: Secondary | ICD-10-CM | POA: Diagnosis not present

## 2018-12-11 NOTE — Telephone Encounter (Signed)
Will, occupational therapist, called and left a message stating he needs verbal orders for O.T. for patient for 2 times a week for 3 weeks. Also states need RX for bariatric bedside commode and to have that sent to DME company of providers choice. His call back number is [phone].

## 2018-12-11 NOTE — Telephone Encounter (Signed)
Thanks- please verbally ok the orders and then send message back so I can do the commode order tomorrow

## 2018-12-12 ENCOUNTER — Telehealth: Payer: Self-pay | Admitting: Family Medicine

## 2018-12-12 ENCOUNTER — Encounter: Payer: Self-pay | Admitting: Family Medicine

## 2018-12-12 ENCOUNTER — Ambulatory Visit: Payer: Self-pay

## 2018-12-12 DIAGNOSIS — Z7409 Other reduced mobility: Secondary | ICD-10-CM | POA: Insufficient documentation

## 2018-12-12 DIAGNOSIS — L89891 Pressure ulcer of other site, stage 1: Secondary | ICD-10-CM | POA: Diagnosis not present

## 2018-12-12 DIAGNOSIS — I480 Paroxysmal atrial fibrillation: Secondary | ICD-10-CM | POA: Diagnosis not present

## 2018-12-12 DIAGNOSIS — Z7984 Long term (current) use of oral hypoglycemic drugs: Secondary | ICD-10-CM | POA: Diagnosis not present

## 2018-12-12 DIAGNOSIS — I4891 Unspecified atrial fibrillation: Secondary | ICD-10-CM | POA: Diagnosis not present

## 2018-12-12 DIAGNOSIS — Z86711 Personal history of pulmonary embolism: Secondary | ICD-10-CM

## 2018-12-12 DIAGNOSIS — Z5181 Encounter for therapeutic drug level monitoring: Secondary | ICD-10-CM

## 2018-12-12 DIAGNOSIS — E039 Hypothyroidism, unspecified: Secondary | ICD-10-CM | POA: Diagnosis not present

## 2018-12-12 DIAGNOSIS — E119 Type 2 diabetes mellitus without complications: Secondary | ICD-10-CM | POA: Diagnosis not present

## 2018-12-12 DIAGNOSIS — E7800 Pure hypercholesterolemia, unspecified: Secondary | ICD-10-CM | POA: Diagnosis not present

## 2018-12-12 DIAGNOSIS — S81801D Unspecified open wound, right lower leg, subsequent encounter: Secondary | ICD-10-CM | POA: Diagnosis not present

## 2018-12-12 DIAGNOSIS — Z7901 Long term (current) use of anticoagulants: Secondary | ICD-10-CM | POA: Diagnosis not present

## 2018-12-12 DIAGNOSIS — I279 Pulmonary heart disease, unspecified: Secondary | ICD-10-CM | POA: Diagnosis not present

## 2018-12-12 DIAGNOSIS — Z86718 Personal history of other venous thrombosis and embolism: Secondary | ICD-10-CM

## 2018-12-12 DIAGNOSIS — E662 Morbid (severe) obesity with alveolar hypoventilation: Secondary | ICD-10-CM | POA: Diagnosis not present

## 2018-12-12 LAB — POCT INR: INR: 3.7 — AB (ref 2.0–3.0)

## 2018-12-12 NOTE — Telephone Encounter (Signed)
Please ok those verbal orders  

## 2018-12-12 NOTE — Telephone Encounter (Signed)
Need verbal orders for skill nursing   2x a week 6wks

## 2018-12-12 NOTE — Patient Instructions (Signed)
NR today 3.7  *patient has not been eating normal since husband has been in hospital.  He is home now and she will be getting back to normal routine next week.  Her prior levels have been normal on this dosing.    Hold dose today (3/13), then resume taking 5mg  daily EXCEPT for 2.5mg  on mondays and Fridays, recheck in 1-2 weeks.  Patient is to come in next week to see Dr. [name].  I will check INR at that time.

## 2018-12-12 NOTE — Telephone Encounter (Signed)
Verbal order given, routed back to PCP for commode order

## 2018-12-12 NOTE — Telephone Encounter (Signed)
Order done in IN box

## 2018-12-15 NOTE — Telephone Encounter (Signed)
Order mailed to pt per pt request, pt said she doesn't know if she will need it because her insurance said they were already sending her a commode but she will keep the order incase they ask her for it

## 2018-12-15 NOTE — Telephone Encounter (Signed)
Verbal order given  

## 2018-12-16 ENCOUNTER — Telehealth: Payer: Self-pay | Admitting: *Deleted

## 2018-12-16 DIAGNOSIS — L89891 Pressure ulcer of other site, stage 1: Secondary | ICD-10-CM | POA: Diagnosis not present

## 2018-12-16 DIAGNOSIS — Z7984 Long term (current) use of oral hypoglycemic drugs: Secondary | ICD-10-CM | POA: Diagnosis not present

## 2018-12-16 DIAGNOSIS — E119 Type 2 diabetes mellitus without complications: Secondary | ICD-10-CM | POA: Diagnosis not present

## 2018-12-16 DIAGNOSIS — E662 Morbid (severe) obesity with alveolar hypoventilation: Secondary | ICD-10-CM | POA: Diagnosis not present

## 2018-12-16 DIAGNOSIS — I4891 Unspecified atrial fibrillation: Secondary | ICD-10-CM | POA: Diagnosis not present

## 2018-12-16 DIAGNOSIS — E039 Hypothyroidism, unspecified: Secondary | ICD-10-CM | POA: Diagnosis not present

## 2018-12-16 DIAGNOSIS — I279 Pulmonary heart disease, unspecified: Secondary | ICD-10-CM | POA: Diagnosis not present

## 2018-12-16 DIAGNOSIS — S81801D Unspecified open wound, right lower leg, subsequent encounter: Secondary | ICD-10-CM | POA: Diagnosis not present

## 2018-12-16 DIAGNOSIS — E7800 Pure hypercholesterolemia, unspecified: Secondary | ICD-10-CM | POA: Diagnosis not present

## 2018-12-16 NOTE — Telephone Encounter (Signed)
Left VM giving Millie the verbal order but also requested her to call back to discuss the other questions Dr. [name] had

## 2018-12-16 NOTE — Telephone Encounter (Signed)
Thanks for the update  She is taking diclofenac for pain (even with risk of nsaid in addn to warfarin -aware)  Is she taking anything otc?  Please verbally ok SW visit with Millie   Does Millie know of any opportunities for the pt to do mental health counseling by phone?

## 2018-12-16 NOTE — Telephone Encounter (Signed)
Millie Medical Social Worker with [facility] left a voicemail stating that they are assisting patient with different things.  Millie stated that the patient told her that she sees you once a year. Millie stated that patient is having depression, anxiety and difficulty with pain. Millie stated that patient is having a lot pain in her arm and leg. Millie stated that with everything going on with the coronavirus if you would prescribe medication for the patient for these problems? Millie stated that they are getting her a wheelchair and sending out PT, OT, nurse and aid to help with bathing.  Millie wanted a verbal order also for a follow-up visit with patient.

## 2018-12-18 DIAGNOSIS — E7800 Pure hypercholesterolemia, unspecified: Secondary | ICD-10-CM | POA: Diagnosis not present

## 2018-12-18 DIAGNOSIS — E119 Type 2 diabetes mellitus without complications: Secondary | ICD-10-CM | POA: Diagnosis not present

## 2018-12-18 DIAGNOSIS — E662 Morbid (severe) obesity with alveolar hypoventilation: Secondary | ICD-10-CM | POA: Diagnosis not present

## 2018-12-18 DIAGNOSIS — S81801D Unspecified open wound, right lower leg, subsequent encounter: Secondary | ICD-10-CM | POA: Diagnosis not present

## 2018-12-18 DIAGNOSIS — L89891 Pressure ulcer of other site, stage 1: Secondary | ICD-10-CM | POA: Diagnosis not present

## 2018-12-18 DIAGNOSIS — E039 Hypothyroidism, unspecified: Secondary | ICD-10-CM | POA: Diagnosis not present

## 2018-12-18 DIAGNOSIS — I4891 Unspecified atrial fibrillation: Secondary | ICD-10-CM | POA: Diagnosis not present

## 2018-12-18 DIAGNOSIS — Z7984 Long term (current) use of oral hypoglycemic drugs: Secondary | ICD-10-CM | POA: Diagnosis not present

## 2018-12-18 DIAGNOSIS — I279 Pulmonary heart disease, unspecified: Secondary | ICD-10-CM | POA: Diagnosis not present

## 2018-12-19 ENCOUNTER — Other Ambulatory Visit: Payer: Self-pay | Admitting: *Deleted

## 2018-12-19 DIAGNOSIS — Z7984 Long term (current) use of oral hypoglycemic drugs: Secondary | ICD-10-CM | POA: Diagnosis not present

## 2018-12-19 DIAGNOSIS — I4891 Unspecified atrial fibrillation: Secondary | ICD-10-CM | POA: Diagnosis not present

## 2018-12-19 DIAGNOSIS — E039 Hypothyroidism, unspecified: Secondary | ICD-10-CM | POA: Diagnosis not present

## 2018-12-19 DIAGNOSIS — E662 Morbid (severe) obesity with alveolar hypoventilation: Secondary | ICD-10-CM | POA: Diagnosis not present

## 2018-12-19 DIAGNOSIS — E7800 Pure hypercholesterolemia, unspecified: Secondary | ICD-10-CM | POA: Diagnosis not present

## 2018-12-19 DIAGNOSIS — S81801D Unspecified open wound, right lower leg, subsequent encounter: Secondary | ICD-10-CM | POA: Diagnosis not present

## 2018-12-19 DIAGNOSIS — E119 Type 2 diabetes mellitus without complications: Secondary | ICD-10-CM | POA: Diagnosis not present

## 2018-12-19 DIAGNOSIS — I279 Pulmonary heart disease, unspecified: Secondary | ICD-10-CM | POA: Diagnosis not present

## 2018-12-19 DIAGNOSIS — L89891 Pressure ulcer of other site, stage 1: Secondary | ICD-10-CM | POA: Diagnosis not present

## 2018-12-19 MED ORDER — FUROSEMIDE 80 MG PO TABS: 80.0000 mg | ORAL_TABLET | Freq: Two times a day (BID) | ORAL | 0 refills | Status: DC

## 2018-12-19 MED ORDER — METFORMIN HCL 1000 MG PO TABS: ORAL_TABLET | ORAL | 0 refills | Status: DC

## 2018-12-19 MED ORDER — DICLOFENAC SODIUM 75 MG PO TBEC: DELAYED_RELEASE_TABLET | ORAL | 0 refills | Status: DC

## 2018-12-19 NOTE — Telephone Encounter (Signed)
Patient left a voicemail stating that she has only 2-3 pills left of her Furosemide and Metformin and needs this sent to the pharmacy. Patient stated that she also needs a refill on her arthritis medication that starts with a D. Patient stated that she is changing pharmacies and [facility] [address]

## 2018-12-22 ENCOUNTER — Other Ambulatory Visit: Payer: Self-pay | Admitting: Family Medicine

## 2018-12-22 DIAGNOSIS — I4891 Unspecified atrial fibrillation: Secondary | ICD-10-CM | POA: Diagnosis not present

## 2018-12-22 DIAGNOSIS — E119 Type 2 diabetes mellitus without complications: Secondary | ICD-10-CM | POA: Diagnosis not present

## 2018-12-22 DIAGNOSIS — E039 Hypothyroidism, unspecified: Secondary | ICD-10-CM | POA: Diagnosis not present

## 2018-12-22 DIAGNOSIS — E662 Morbid (severe) obesity with alveolar hypoventilation: Secondary | ICD-10-CM | POA: Diagnosis not present

## 2018-12-22 DIAGNOSIS — I279 Pulmonary heart disease, unspecified: Secondary | ICD-10-CM | POA: Diagnosis not present

## 2018-12-22 DIAGNOSIS — E7800 Pure hypercholesterolemia, unspecified: Secondary | ICD-10-CM | POA: Diagnosis not present

## 2018-12-22 DIAGNOSIS — S81801D Unspecified open wound, right lower leg, subsequent encounter: Secondary | ICD-10-CM | POA: Diagnosis not present

## 2018-12-22 DIAGNOSIS — Z7984 Long term (current) use of oral hypoglycemic drugs: Secondary | ICD-10-CM | POA: Diagnosis not present

## 2018-12-22 DIAGNOSIS — L89891 Pressure ulcer of other site, stage 1: Secondary | ICD-10-CM | POA: Diagnosis not present

## 2018-12-22 NOTE — Telephone Encounter (Signed)
Spoke with Millie and she said she did get our message and she is working on finding her a mental health councling by phone but it's hard giving the pandemic we are in. 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 Millie said she is worried about pt's pain and she isn't taking any meds OTC just the diclofenac that's on her med list

## 2018-12-23 DIAGNOSIS — I279 Pulmonary heart disease, unspecified: Secondary | ICD-10-CM | POA: Diagnosis not present

## 2018-12-23 DIAGNOSIS — S81801D Unspecified open wound, right lower leg, subsequent encounter: Secondary | ICD-10-CM | POA: Diagnosis not present

## 2018-12-23 DIAGNOSIS — E039 Hypothyroidism, unspecified: Secondary | ICD-10-CM | POA: Diagnosis not present

## 2018-12-23 DIAGNOSIS — E662 Morbid (severe) obesity with alveolar hypoventilation: Secondary | ICD-10-CM | POA: Diagnosis not present

## 2018-12-23 DIAGNOSIS — I4891 Unspecified atrial fibrillation: Secondary | ICD-10-CM | POA: Diagnosis not present

## 2018-12-23 DIAGNOSIS — Z7984 Long term (current) use of oral hypoglycemic drugs: Secondary | ICD-10-CM | POA: Diagnosis not present

## 2018-12-23 DIAGNOSIS — E7800 Pure hypercholesterolemia, unspecified: Secondary | ICD-10-CM | POA: Diagnosis not present

## 2018-12-23 DIAGNOSIS — E119 Type 2 diabetes mellitus without complications: Secondary | ICD-10-CM | POA: Diagnosis not present

## 2018-12-23 DIAGNOSIS — L89891 Pressure ulcer of other site, stage 1: Secondary | ICD-10-CM | POA: Diagnosis not present

## 2018-12-23 MED ORDER — TRAMADOL HCL 50 MG PO TABS: 50.0000 mg | ORAL_TABLET | Freq: Two times a day (BID) | ORAL | 0 refills | Status: DC | PRN

## 2018-12-23 NOTE — Telephone Encounter (Signed)
[facility] [address]  Pt was also advised of Dr. Royden Purl comments regarding med

## 2018-12-23 NOTE — Telephone Encounter (Signed)
Millie called back she is at pt's house now. Millie said pt's pain is worsening and is really severe today. Millie is requesting some type of pain med sent in to help pt with her pain. Millie said her pain is in her back and legs and her leg pain is so severe that she can't even lift/move her legs. Pt's complaining of SOB but she said it's due to the amount of pain she is in. Pt's O2 was 93%, pulse 78, and BP 130/70. Aldean Jewett is very concern regarding pt's status and pain level and thinks pt needs a prescription strength pain med to help.   Millie will be there for the next hr at phone # [phone], if after abut 11:30 then call pt directly.

## 2018-12-23 NOTE — Telephone Encounter (Signed)
I will send in tramadol - please caution re: sedation and constipation with this   What pharmacy?

## 2018-12-25 ENCOUNTER — Telehealth: Payer: Self-pay

## 2018-12-25 DIAGNOSIS — S81801D Unspecified open wound, right lower leg, subsequent encounter: Secondary | ICD-10-CM | POA: Diagnosis not present

## 2018-12-25 DIAGNOSIS — L89891 Pressure ulcer of other site, stage 1: Secondary | ICD-10-CM | POA: Diagnosis not present

## 2018-12-25 DIAGNOSIS — I4891 Unspecified atrial fibrillation: Secondary | ICD-10-CM | POA: Diagnosis not present

## 2018-12-25 DIAGNOSIS — Z7984 Long term (current) use of oral hypoglycemic drugs: Secondary | ICD-10-CM | POA: Diagnosis not present

## 2018-12-25 DIAGNOSIS — I279 Pulmonary heart disease, unspecified: Secondary | ICD-10-CM | POA: Diagnosis not present

## 2018-12-25 DIAGNOSIS — E039 Hypothyroidism, unspecified: Secondary | ICD-10-CM | POA: Diagnosis not present

## 2018-12-25 DIAGNOSIS — E119 Type 2 diabetes mellitus without complications: Secondary | ICD-10-CM | POA: Diagnosis not present

## 2018-12-25 DIAGNOSIS — E7800 Pure hypercholesterolemia, unspecified: Secondary | ICD-10-CM | POA: Diagnosis not present

## 2018-12-25 DIAGNOSIS — E662 Morbid (severe) obesity with alveolar hypoventilation: Secondary | ICD-10-CM | POA: Diagnosis not present

## 2018-12-25 NOTE — Telephone Encounter (Signed)
Please ok that verbal orde Keep me updated

## 2018-12-25 NOTE — Telephone Encounter (Signed)
Needs verbal order to postpone her OT for 2 weeks due to dizziness and illness. Please call [phone]

## 2018-12-26 ENCOUNTER — Ambulatory Visit (INDEPENDENT_AMBULATORY_CARE_PROVIDER_SITE_OTHER): Payer: Medicare HMO

## 2018-12-26 ENCOUNTER — Other Ambulatory Visit: Payer: Self-pay

## 2018-12-26 ENCOUNTER — Telehealth: Payer: Self-pay | Admitting: Family Medicine

## 2018-12-26 DIAGNOSIS — Z5181 Encounter for therapeutic drug level monitoring: Secondary | ICD-10-CM

## 2018-12-26 DIAGNOSIS — L89891 Pressure ulcer of other site, stage 1: Secondary | ICD-10-CM | POA: Diagnosis not present

## 2018-12-26 DIAGNOSIS — E7800 Pure hypercholesterolemia, unspecified: Secondary | ICD-10-CM | POA: Diagnosis not present

## 2018-12-26 DIAGNOSIS — Z86711 Personal history of pulmonary embolism: Secondary | ICD-10-CM

## 2018-12-26 DIAGNOSIS — Z86718 Personal history of other venous thrombosis and embolism: Secondary | ICD-10-CM

## 2018-12-26 DIAGNOSIS — I279 Pulmonary heart disease, unspecified: Secondary | ICD-10-CM | POA: Diagnosis not present

## 2018-12-26 DIAGNOSIS — E119 Type 2 diabetes mellitus without complications: Secondary | ICD-10-CM | POA: Diagnosis not present

## 2018-12-26 DIAGNOSIS — I4891 Unspecified atrial fibrillation: Secondary | ICD-10-CM | POA: Diagnosis not present

## 2018-12-26 DIAGNOSIS — E662 Morbid (severe) obesity with alveolar hypoventilation: Secondary | ICD-10-CM | POA: Diagnosis not present

## 2018-12-26 DIAGNOSIS — E039 Hypothyroidism, unspecified: Secondary | ICD-10-CM | POA: Diagnosis not present

## 2018-12-26 DIAGNOSIS — S81801D Unspecified open wound, right lower leg, subsequent encounter: Secondary | ICD-10-CM | POA: Diagnosis not present

## 2018-12-26 DIAGNOSIS — Z7984 Long term (current) use of oral hypoglycemic drugs: Secondary | ICD-10-CM | POA: Diagnosis not present

## 2018-12-26 LAB — POCT INR: INR: 4 — AB (ref 2.0–3.0)

## 2018-12-26 MED ORDER — SIMVASTATIN 20 MG PO TABS: 20.0000 mg | ORAL_TABLET | Freq: Every day | ORAL | 0 refills | Status: DC

## 2018-12-26 MED ORDER — DOXYCYCLINE HYCLATE 100 MG PO TABS: 100.0000 mg | ORAL_TABLET | Freq: Two times a day (BID) | ORAL | 0 refills | Status: DC

## 2018-12-26 NOTE — Telephone Encounter (Signed)
Pt need her simvastatin refill called in to  [facility] pt has 2 left

## 2018-12-26 NOTE — Patient Instructions (Addendum)
INR today 4.0  *patient needs follow up with MD.  Will work on scheduling a Webex visit next week for eval.    Hold dose today (3/27), then decrease weekly dose to taking 5mg  Sun, Tue, Thurs and 2.5mg  on Mond, Wed, Fri and Saturday recheck on Monday due to abx use and acute illness.    Patient is experiencing ongoing, but increased respiratory congestion and is using her nebulizer medication and being followed with home health.  Will coordinate a visit next week through webex with provider.  She has been started on doxycycline and will recheck inr on Monday and call us with an update. She knows that if symptoms get worse with difficulty breathing, fever, or other signs or resp distress then she is to go to the ER.  Patient verbalizes understanding.

## 2018-12-26 NOTE — Telephone Encounter (Signed)
A high INR will not cause sob that I know of  If green sputum production is new-I so want to send an antibiotic I will pend doxycycline to sent to pharmacy of choice  All antibiotics can affect her coumadin /INR so her coumadin person needs to know -especially since her INR is already high (and she will need to check again soon)  Unfortunately I cannot use penicillin because she is allergic to it   If worse-or pulse ox stays low- she should go to ER this weekend  Update me on Monday-we can also do a virtual visit if needed   Keep [place] posted

## 2018-12-26 NOTE — Telephone Encounter (Signed)
How long has she been coughing?  Other symptoms?  Fever/nasal symptoms/sore throat/wheezing? Thanks

## 2018-12-26 NOTE — Telephone Encounter (Signed)
Spoke with Sharyl Nimrod and she said pt didn't complain of any sinus sxs/ nasal issues. She said it's just the cough and congestion. Pt said the cough isn't new and she's had it for a while the only new sxs is chest congesting and coughing up phlegm. They said that the phlegm is green colored, no other major sxs except she is more SOB no wheezing heard, and no fever, home health is concerned but pt thinks sxs are due to her INR being 4.0,  They did tell pt that they don't thinks her sxs are related to her INR but pt didn't believe them.

## 2018-12-26 NOTE — Telephone Encounter (Signed)
Verbal order and Dr. Royden Purl comments given to Will

## 2018-12-26 NOTE — Telephone Encounter (Signed)
Pt and Meredith notified of Dr. Royden Purl comments and instructions. Rx sent to pharmacy and they are going to recheck pt's INR on Monday and eval pt and call us with an update.

## 2018-12-26 NOTE — Telephone Encounter (Signed)
I did speak with patient regarding her elevated INR and have noted the abx use.  She is to hold her dose today and I have also decreased her dosage 16%.  Will see what reading is on Monday.  Patient is aware that if symptoms progress over the weekend with elevating fever, sob or other respiratory distress, she is to go to the ER.

## 2018-12-26 NOTE — Telephone Encounter (Signed)
Meredith from [facility] called to say the aide went out to see the pt today and the Pt was feeling more SOB on 4Liters 86%. Sharyl Nimrod called her back to check on her and she said she was on her CPAP and at 96%. Cough that is not new but coughing up green sputum.  On another note,  INR 4.0. results are to come to the office soon.  Please call Sharyl Nimrod at [phone]

## 2018-12-29 ENCOUNTER — Telehealth: Payer: Self-pay | Admitting: *Deleted

## 2018-12-29 ENCOUNTER — Ambulatory Visit: Payer: Self-pay

## 2018-12-29 DIAGNOSIS — I279 Pulmonary heart disease, unspecified: Secondary | ICD-10-CM | POA: Diagnosis not present

## 2018-12-29 DIAGNOSIS — E662 Morbid (severe) obesity with alveolar hypoventilation: Secondary | ICD-10-CM | POA: Diagnosis not present

## 2018-12-29 DIAGNOSIS — I4891 Unspecified atrial fibrillation: Secondary | ICD-10-CM

## 2018-12-29 DIAGNOSIS — E7800 Pure hypercholesterolemia, unspecified: Secondary | ICD-10-CM | POA: Diagnosis not present

## 2018-12-29 DIAGNOSIS — Z7984 Long term (current) use of oral hypoglycemic drugs: Secondary | ICD-10-CM | POA: Diagnosis not present

## 2018-12-29 DIAGNOSIS — Z86718 Personal history of other venous thrombosis and embolism: Secondary | ICD-10-CM

## 2018-12-29 DIAGNOSIS — Z5181 Encounter for therapeutic drug level monitoring: Secondary | ICD-10-CM

## 2018-12-29 DIAGNOSIS — L89891 Pressure ulcer of other site, stage 1: Secondary | ICD-10-CM | POA: Diagnosis not present

## 2018-12-29 DIAGNOSIS — S81801D Unspecified open wound, right lower leg, subsequent encounter: Secondary | ICD-10-CM | POA: Diagnosis not present

## 2018-12-29 DIAGNOSIS — F329 Major depressive disorder, single episode, unspecified: Secondary | ICD-10-CM

## 2018-12-29 DIAGNOSIS — E119 Type 2 diabetes mellitus without complications: Secondary | ICD-10-CM | POA: Diagnosis not present

## 2018-12-29 DIAGNOSIS — G4733 Obstructive sleep apnea (adult) (pediatric): Secondary | ICD-10-CM

## 2018-12-29 DIAGNOSIS — Z86711 Personal history of pulmonary embolism: Secondary | ICD-10-CM

## 2018-12-29 DIAGNOSIS — E039 Hypothyroidism, unspecified: Secondary | ICD-10-CM | POA: Diagnosis not present

## 2018-12-29 LAB — POCT INR: INR: 2.8 (ref 2.0–3.0)

## 2018-12-29 MED ORDER — FLUOXETINE HCL 20 MG PO TABS: 20.0000 mg | ORAL_TABLET | Freq: Every day | ORAL | 3 refills | Status: DC

## 2018-12-29 NOTE — Telephone Encounter (Signed)
I printed px/order for cpap machine-in IN box   I pended px for prozac 20 mg to send to [employer] with 1/2 pill daily (am is fine) for 1 week and if well tolerated then advance to 1 pill daily   Early on it is common to feel a bit foggy and have some nausea -this should resolve quickly  If depression worsens instead of improving or if suicidal thoughts-stop it and alert [place]  It takes about a month to know how it works Please update me in a month to see if we need to advance dose  Thanks to [facility] please for working with her with all of this   Keep me posted

## 2018-12-29 NOTE — Telephone Encounter (Signed)
Thanks -the px is in IN box

## 2018-12-29 NOTE — Telephone Encounter (Signed)
Update on coordinating office visit:  I am continuing to work with [facility] in the home to get patient evaluated by provider.     Currently we are trying to navigate patient getting setup with Webex with [facility] support to coordinate a webex visit this week with provider.  More to come on that.  I am waiting to here from their rep today or tomorrow as to if they can help [place] with tablet connectivity.  Will keep you posted.

## 2018-12-29 NOTE — Patient Instructions (Signed)
INR today 2.8  Continue on decreased weekly dose of  5mg  Sun, Tue, Thurs and 2.5mg  on Mond, Wed, Fri and Saturday recheck in 1 week.  Patient continues on abx therapy for resp infection.  Will continue to monitor closely.    Patient's acute respiratory symptoms are improving and lungs are clear today per home health nurses report.  Spoke with Kennith Center, [facility] nurse and gave all instructions, she verbalizes understanding and will communicate with patient.

## 2018-12-29 NOTE — Telephone Encounter (Signed)
Spoke with [facility] nurse, Tracey regarding INR today of 2.8 and dosing instructions given.  Continue same dose and recheck in 1 week.  Nurse to communicate with patient.  Also, nurse states that patient's CPAP orders should be on R/X form and faxed to [facility] (she believes they are located out of [place], [place]).  FYI to [place] and Dr. [name].

## 2018-12-29 NOTE — Telephone Encounter (Signed)
French Ana left a voicemail stating that patient's PT/INR today is 2.8. Patient has no complaints today, but did tell her that she ate some greens over the weekend.  French Ana stated that patient needs a new CPAP mask and new machine. See other message left by Barnabas Lister Social worker. CPAP machine has been addressed.

## 2018-12-29 NOTE — Telephone Encounter (Signed)
[employer] Social Worker with [facility] called stating that patient needs a new script for a CPAP machine. Millie stated that the machine that patient has is over 64 years old and is nasty and not working properly. Millie stated that the Tramadol is working well for the patient. Millie stated that she is working on getting the patient a therapist, but is having a hard time finding someone that is willing to call the patient once a week. Millie stated that since she is a Child psychotherapist that she will contact the patient once a week by telephone and discuss her mental status with her. Millie wants to know if Dr. [name] will prescribe an anti-depressant at this time for the patient.

## 2018-12-29 NOTE — Telephone Encounter (Signed)
Millie advised order for Cpap machine done she will check with the head nurse to see where we need to send order to and also if paper order is okay or does it need to be on Rx paper.  Millie advised of Dr. Royden Purl comments and instructions regarding Prozac and I also called pt and advised her of all of this information too. Rx sent to pharmacy

## 2018-12-29 NOTE — Telephone Encounter (Signed)
Thank you for working on that

## 2018-12-29 NOTE — Telephone Encounter (Signed)
Spoke with Amy Bryant with Home health.  Patient is to continue on current dose and Amy Bryant is to recheck her INR in 1 week. HH  Nurse to notify patient.   She does report that patient's respiratory symptoms are improving and lungs are clear on ausculation today.

## 2018-12-30 NOTE — Telephone Encounter (Signed)
Spoke with Millie and she said since [facility] has been working with pt they can take the Cpap order we just have to fax it to the [place] site and they will handle the rest. Order faxed

## 2018-12-30 NOTE — Telephone Encounter (Signed)
Called the [facility] company and they gave me their fax # [phone], they did advise me that in order to get pt a new sleep study we would have to have the settings on the Rx, also fax over her sleep study, and she has to have had an office visit in the last 6 months too, we don't have any of that will Route back to Dr. [name] for her to advise

## 2019-01-01 ENCOUNTER — Telehealth: Payer: Self-pay

## 2019-01-01 ENCOUNTER — Ambulatory Visit (INDEPENDENT_AMBULATORY_CARE_PROVIDER_SITE_OTHER): Payer: Medicare HMO | Admitting: Family Medicine

## 2019-01-01 ENCOUNTER — Other Ambulatory Visit: Payer: Self-pay

## 2019-01-01 ENCOUNTER — Encounter: Payer: Self-pay | Admitting: Family Medicine

## 2019-01-01 DIAGNOSIS — Z7984 Long term (current) use of oral hypoglycemic drugs: Secondary | ICD-10-CM | POA: Diagnosis not present

## 2019-01-01 DIAGNOSIS — F329 Major depressive disorder, single episode, unspecified: Secondary | ICD-10-CM

## 2019-01-01 DIAGNOSIS — G4733 Obstructive sleep apnea (adult) (pediatric): Secondary | ICD-10-CM | POA: Diagnosis not present

## 2019-01-01 DIAGNOSIS — I279 Pulmonary heart disease, unspecified: Secondary | ICD-10-CM | POA: Diagnosis not present

## 2019-01-01 DIAGNOSIS — J069 Acute upper respiratory infection, unspecified: Secondary | ICD-10-CM

## 2019-01-01 DIAGNOSIS — E7800 Pure hypercholesterolemia, unspecified: Secondary | ICD-10-CM | POA: Diagnosis not present

## 2019-01-01 DIAGNOSIS — J4540 Moderate persistent asthma, uncomplicated: Secondary | ICD-10-CM | POA: Diagnosis not present

## 2019-01-01 DIAGNOSIS — E119 Type 2 diabetes mellitus without complications: Secondary | ICD-10-CM

## 2019-01-01 DIAGNOSIS — E662 Morbid (severe) obesity with alveolar hypoventilation: Secondary | ICD-10-CM | POA: Diagnosis not present

## 2019-01-01 DIAGNOSIS — I4891 Unspecified atrial fibrillation: Secondary | ICD-10-CM | POA: Diagnosis not present

## 2019-01-01 DIAGNOSIS — E039 Hypothyroidism, unspecified: Secondary | ICD-10-CM

## 2019-01-01 DIAGNOSIS — E559 Vitamin D deficiency, unspecified: Secondary | ICD-10-CM

## 2019-01-01 DIAGNOSIS — S81801D Unspecified open wound, right lower leg, subsequent encounter: Secondary | ICD-10-CM | POA: Diagnosis not present

## 2019-01-01 DIAGNOSIS — E213 Hyperparathyroidism, unspecified: Secondary | ICD-10-CM | POA: Diagnosis not present

## 2019-01-01 DIAGNOSIS — L89891 Pressure ulcer of other site, stage 1: Secondary | ICD-10-CM | POA: Diagnosis not present

## 2019-01-01 DIAGNOSIS — Z7409 Other reduced mobility: Secondary | ICD-10-CM

## 2019-01-01 DIAGNOSIS — M5137 Other intervertebral disc degeneration, lumbosacral region: Secondary | ICD-10-CM

## 2019-01-01 MED ORDER — MOMETASONE FUROATE 50 MCG/ACT NA SUSP: NASAL | 11 refills | Status: DC

## 2019-01-01 MED ORDER — FLUCONAZOLE 150 MG PO TABS: 150.0000 mg | ORAL_TABLET | Freq: Once | ORAL | 0 refills | Status: AC

## 2019-01-01 MED ORDER — MUPIROCIN 2 % EX OINT: TOPICAL_OINTMENT | CUTANEOUS | 4 refills | Status: AC

## 2019-01-01 NOTE — Assessment & Plan Note (Signed)
Due for A1C and microalbumin  Will see if home nurse can help with that

## 2019-01-01 NOTE — Assessment & Plan Note (Signed)
Continues spiriva and albuterol  Recent cough from URI is starting to improve on doxycycline  Will try to avoid prednisone due to DM2 Will finish doxy and alert [place] if wheezing

## 2019-01-01 NOTE — Telephone Encounter (Signed)
Tranace nurse with [facility] said HH aide had texted Tranace that pt is on 4 L of O2 but pulse ox was 82 and then recked pulse ox was 72. Bipap helped pt to breather better. I spoke with pt and she said she had been moving around and getting a bath with the [facility] aides assistance and that her pulse ox is usually 80--81 since [facility] has been coming to see pt. Pt was SOB and lightheaded because she was moving around but now that she is sitting she feels better. Pt said she does use bipap on and off if napping or sometimes it does help her breathe easier. Pt said she thinks O2 at 4L is still ok. Pt to cb if needed. Pt is speaking in full sentences and does not appear to be out of breath at this time. FYI to Dr [name].

## 2019-01-01 NOTE — Telephone Encounter (Signed)
She did not appear sob during our web visit today -or cyanotic.  Optimally would want to titrate 02 to a pulse ox of 89 or more and if bipap is needed for that it is fine.  Also if the home nurse wants to listen to her lungs that would be helpful.  I am leaving for the day and am out on CME tomorrow but will try to check in.  She has had a cough and is already on antibiotics

## 2019-01-01 NOTE — Assessment & Plan Note (Addendum)
New cpap was ordered  Old one is creating sore on bridge of nose - refilled bactroban  Social worker is trying to find a vendor who takes her ins Home sleep study was ordered as well since her last one was years ago

## 2019-01-01 NOTE — Assessment & Plan Note (Signed)
Caught likely from husband after his hospitalization  Cough prod green sputum-is lightening up  No wheeze On doxycycline  Did not cough during visit  Will watch No fever or signs of covid otherwise

## 2019-01-01 NOTE — Assessment & Plan Note (Signed)
Still on warfarin  No change in rate (has home nurse coming) and no palpitations

## 2019-01-01 NOTE — Progress Notes (Addendum)
Virtual Visit via Video Note  I connected with Amy Bryant on 01/01/19 at 10:30 AM EDT by a video enabled telemedicine application and verified that I am speaking with the correct person using two identifiers. Patient is at home (her son helped her set up the web ex and it worked very well) I am in the office    I discussed the limitations of evaluation and management by telemedicine and the availability of in person appointments. The patient expressed understanding and agreed to proceed.  History of Present Illness: Follow up of chronic medical problems   She is mobility impaired due to morbid obesity Home care currently  PT/ OT/NSG/social work   [facility]  Waiting to get approved with bariatric wheelchair  Has son living with them who also helps   In general she is feeling ok  Still some green sputum   Her 02 is at 4L   We px new cpap for OSA Looking for another company that is in network  Obesity hypoventilation syndrome -has 02 Also symbicort and albuterol for reactive airways  Also has uri - on doxycycline (from husband who was in the hospital)  A fib -on coumadin for stroke prevention Gets home PT/INR Lab Results  Component Value Date   INR 2.8 12/29/2018   INR 4.0 (A) 12/26/2018   INR 3.7 (A) 12/12/2018    Chronic pain /joint - continues / has voltaren (refuses to stop even with bleed risk from warfarin- she is ok with taking this risk and we have discussed it before Recently added tramadol for pain -it has helped a lot  It is not causing constipation or sedation or dizziness Also has lumbar DDD Legs are the most painful   DM2 Lab Results  Component Value Date   HGBA1C 5.7 12/03/2017  glucose is 90-174 on average- checks in ams and sometimes after dinner No hypoglycemia  Unable to come to the office due to mobility issues (and lack of ins for some time) Now has medicare Overdue for this and labs  Taking metformin  glipizide Statin  Foot  care/issues  Was not eating well due to worry about her husband Amy Bryant not eating  Now she is closer to normal  Veggies Chicken -grilled and broiled  Sandwich    Hyperparathyroidism Lab Results  Component Value Date   CALCIUM 12.2 (H) 12/03/2017   PHOS 2.5 12/03/2017  last D level a year ago 41.8 She cannot afford surgeon for f/u of this  Takes her vit D regularly occ spasms  Cannot do dexa due to mobility issues and morbid obesity  Hypothyroidism  Pt has no clinical changes- feels the same (her fatigue is on and off)  No change in energy level/ hair or skin/ edema and no tremor Lab Results  Component Value Date   TSH 4.57 (H) 12/03/2017      Hyperlipidemia Lab Results  Component Value Date   CHOL 144 12/03/2017   HDL 39.40 12/03/2017   LDLCALC 86 12/03/2017   LDLDIRECT 90.7 11/28/2012   TRIG 91.0 12/03/2017   CHOLHDL 4 12/03/2017   On simvastatin and diet  Eating has been off /now is very healthy   Recently started on prozac for depression (reactive)- cannot tell if it is working yet  Has panic attacks and anxiety /also feels down - stress and pain  She gets tearful  No side effects  Husband is end stage copd and heart dz -just out of hospital He continues to smoke  They are verbally abusive to each other at times   SW is helping with counseling Amy Bryant   Activity is limited but she is able   Spot on nose - from cpap   Review of Systems  Constitutional: Negative for chills, diaphoresis, fever and weight loss.       Fatigue   HENT: Negative for congestion and sinus pain.   Eyes: Negative for discharge and redness.  Respiratory: Positive for cough and sputum production. Negative for hemoptysis, shortness of breath and wheezing.   Cardiovascular: Positive for leg swelling. Negative for chest pain and palpitations.       Leg swelling is chronic and unchanged  Gastrointestinal: Negative for constipation, diarrhea, nausea and vomiting.  Musculoskeletal:  Positive for back pain, joint pain and myalgias.  Skin: Positive for itching. Negative for rash.       Yeast infection  Neurological: Negative for dizziness, sensory change, speech change and headaches.  Psychiatric/Behavioral: Positive for depression. The patient is nervous/anxious.      Patient Active Problem List   Diagnosis Date Noted  . URI (upper respiratory infection) 01/01/2019  . Reactive depression (situational) 12/29/2018  . Mobility impaired 12/12/2018  . Hematuria 12/03/2017  . Pedal edema 12/23/2016  . OSA (obstructive sleep apnea) 12/21/2016  . Financial difficulties 09/16/2016  . Colon cancer screening 12/15/2013  . Encounter for therapeutic drug monitoring 11/05/2013  . Routine general medical examination at a health care facility 05/20/2012  . Atrial fibrillation (HCC) 11/23/2010  . History of pulmonary embolism 11/23/2010  . Vitamin D deficiency 11/15/2010  . DYSURIA, HX OF 08/19/2009  . PURE HYPERCHOLESTEROLEMIA 08/16/2009  . ATRIAL FIBRILLATION, HX OF 12/03/2008  . Diabetes type 2, controlled (HCC) 08/13/2008  . Hyperparathyroidism (HCC) 07/30/2008  . Hypothyroidism 02/13/2008  . Morbid obesity (HCC) 02/04/2008  . Reactive airway disease 02/04/2008  . GERD 02/04/2008  . DISC DISEASE, LUMBAR 02/04/2008  . PULMONARY EMBOLISM, HX OF 02/04/2008  . TOBACCO USE, QUIT 02/04/2008  . Obesity hypoventilation syndrome (HCC) 08/11/2007  . COR PULMONALE 08/11/2007   Past Medical History:  Diagnosis Date  . Atrial fibrillation (HCC)    with cardioversion success  . Cor pulmonale (HCC)    and obesity hypoventilation syndrome  . GERD (gastroesophageal reflux disease)   . Hypothyroid   . Morbid obesity (HCC)   . OA (osteoarthritis)   . Pulmonary embolism (HCC)    hx of on coumadin  . Reactive airways dysfunction syndrome [facility])    Past Surgical History:  Procedure Laterality Date  . TONSILLECTOMY     Social History   Tobacco Use  . Smoking status: Former  Games developer  . Smokeless tobacco: Former Engineer, water Use Topics  . Alcohol use: No    Alcohol/week: 0.0 standard drinks  . Drug use: No   Family History  Problem Relation Age of Onset  . Heart disease Father 26       MI  . Diabetes Brother    Allergies  Allergen Reactions  . Augmentin [Amoxicillin-Pot Clavulanate] Other (See Comments)    Rash itching  . Pseudoephedrine     REACTION: tightness in chest   Current Outpatient Medications on File Prior to Visit  Medication Sig Dispense Refill  . albuterol (PROVENTIL) (2.5 MG/3ML) 0.083% nebulizer solution USE 1 VIAL IN NEBULIZER EVERY 4 HOURS AS NEEDED FOR  WHEEZING 225 vial 1  . budesonide-formoterol (SYMBICORT) 160-4.5 MCG/ACT inhaler INHALE 2 PUFFS INTO LUNGS TWICE A DAY 10.2 Inhaler 0  . cholecalciferol (VITAMIN D)  1000 UNITS tablet Take 4,000 Units by mouth daily.    . ciclopirox (LOPROX) 0.77 % cream APPLY TO AFFECTED AREA TWICE A DAY AS NEEDED 90 g 0  . diclofenac (VOLTAREN) 75 MG EC tablet TAKE ONE TABLET BY MOUTH TWICE DAILY AS NEEDED FOR  PAIN  WITH  FOOD. 180 tablet 0  . diltiazem (CARTIA XT) 120 MG 24 hr capsule Take 1 capsule (120 mg total) by mouth daily. 90 capsule 3  . doxycycline (VIBRA-TABS) 100 MG tablet Take 1 tablet (100 mg total) by mouth 2 (two) times daily. 14 tablet 0  . FLUoxetine (PROZAC) 20 MG tablet Take 1 tablet (20 mg total) by mouth daily. 30 tablet 3  . furosemide (LASIX) 80 MG tablet Take 1 tablet (80 mg total) by mouth 2 (two) times daily. 180 tablet 0  . glipiZIDE (GLUCOTROL XL) 5 MG 24 hr tablet TAKE 1 TABLET BY MOUTH EVERY DAY 30 tablet 0  . glucose blood test strip Test blood sugar once daily and as needed for DM 250.0 100 each 3  . Lancets (ONETOUCH ULTRASOFT) lancets Test blood sugar once daily and as needed for DM 250.0     . levothyroxine (SYNTHROID, LEVOTHROID) 175 MCG tablet TAKE 1 TABLET BY MOUTH EVERY DAY BEFORE BREAKFAST 30 tablet 0  . loratadine (CLARITIN) 10 MG tablet OTC as directed.      . metFORMIN (GLUCOPHAGE) 1000 MG tablet TAKE 1 TABLET BY MOUTH TWICE DAILY WITH A MEAL 180 tablet 0  . mometasone (ELOCON) 0.1 % cream Tiny amount to each ear canal twice weekly as needed.    Marland Kitchen omega-3 acid ethyl esters (LOVAZA) 1 G capsule Take by mouth 2 (two) times daily.    . Omega-3 Fatty Acids (FISH OIL PO) Take 1 tablet by mouth daily.    . potassium chloride SA (KLOR-CON M20) 20 MEQ tablet TAKE TWO TABLETS BY MOUTH ONCE DAILY 60 tablet 0  . simvastatin (ZOCOR) 20 MG tablet Take 1 tablet (20 mg total) by mouth at bedtime. 90 tablet 0  . traMADol (ULTRAM) 50 MG tablet Take 1 tablet (50 mg total) by mouth every 12 (twelve) hours as needed for moderate pain or severe pain. Caution of sedation/falls and constipation 60 tablet 0  . warfarin (COUMADIN) 5 MG tablet TAKE AS DIRECTED BY THE ANTICOAGULATION CLINIC. 90 tablet 1   No current facility-administered medications on file prior to visit.       Observations/Objective: Pt appears well, sitting in chair  Has a red spot/wound on bridge of nose from her cpap (per pt baseline)  No other skin changes No hoarsness No facial droop or swelling  Not sob in conversation  Mood is positive /eager to talk and candidly discuss her stressors  Wearing 02 per [place]  Not tearful     Assessment and Plan: Problem List Items Addressed This Visit      Cardiovascular and Mediastinum   Atrial fibrillation (HCC) (Chronic)    Still on warfarin  No change in rate (has home nurse coming) and no palpitations         Respiratory   Obesity hypoventilation syndrome (HCC)    Continues 02 Home nurse checked sats, staying at 4L which keeps her from being sob  Is more sob on exertion but trying to move more anyway      Reactive airway disease    Continues spiriva and albuterol  Recent cough from URI is starting to improve on doxycycline  Will try to avoid prednisone  due to DM2 Will finish doxy and alert us if wheezing       OSA (obstructive sleep  apnea)    New cpap was ordered  Old one is creating sore on bridge of nose - refilled bactroban  Child psychotherapistocial worker is trying to find a vendor who takes her ins Home sleep study was ordered as well since her last one was years ago      URI (upper respiratory infection)    Caught likely from husband after his hospitalization  Cough prod green sputum-is lightening up  No wheeze On doxycycline  Did not cough during visit  Will watch No fever or signs of covid otherwise      Relevant Medications   mupirocin ointment (BACTROBAN) 2 %     Endocrine   Hypothyroidism    TSH will be ordered with home care labs  No clinical changes per pt       Diabetes type 2, controlled (HCC)    Due for A1C and microalbumin  Will see if home nurse can help with that       Hyperparathyroidism (HCC)    Pt in the past has not been able to afford a surgeon evaluation  Now has medicare  Getting a wheelchair to enable her to leave house  In the future we may be able to organize this (when the covid situation allows for visits) Checking ca/ phos with next labs ordered for home care        Musculoskeletal and Integument   DISC DISEASE, LUMBAR    Mod to severe chronic pain adding to mobility impairment PT and OT were ordered for home  Tramadol is helping She takes nsaid also with warning of bleed (warfarin) and she accepts the risks        Other   PURE HYPERCHOLESTEROLEMIA    Taking statin Disc goals for lipids and reasons to control them Rev last labs with pt Rev low sat fat diet in detail Ordering lipids with labs for home draw      Morbid obesity (HCC)    Pt does not think weight has changed  Needs wheelchair to leave house Needs help of people/walker at home  Disc diet- she is making effort to eat lean protein and produce  Recently ate poorly due to mood-this has improved       Vitamin D deficiency    In the setting of hyperparathyroidism  Doing well with vitamin D dosing Will check  level with home D labs      Mobility impaired - Primary    Face to face visit today  Due to morbid obesity and severe back and leg pain she cannot get up from chair or ambulate w/o help from 2 people  She can only leave the house with assistance of several people and bariatric wheelchair  Home PT/OT/NSG were ordered -this will be very helpful  Using pain medication for the chronic pain  Pt aware that weight loss would help- lack of mobility makes this difficult       Reactive depression (situational)    Reviewed stressors/ coping techniques/symptoms/ support sources/ tx options and side effects in detail today Very worried about husband who has late stage copd with palliative care  Has family support  Mobility issues and chronic pain do not help  Enc to talk to family  Offered counseling-social worker is helping with that  prozac recently started-no side effects  Will titrate up if helpful and needed Discussed expectations  of SSRI medication including time to effectiveness and mechanism of action, also poss of side effects (early and late)- including mental fuzziness, weight or appetite change, nausea and poss of worse dep or anxiety (even suicidal thoughts)  Pt voiced understanding and will stop med and update if this occurs         Other Visit Diagnoses    Chronic pulmonary heart disease (HCC)   (Chronic)         Follow Up Instructions: Continue current medicines  Use the abx ointment on spot on nasal bridge  Start PT/OT when ready at home  Talk to your social worker about physical and emotional needs Try to eat healthy foods and exercise when you are ready Finish your doxycycline and alert [place] if no further improvement in cough/ sputum or if you develop shortness of breath or fever     I discussed the assessment and treatment plan with the patient. The patient was provided an opportunity to ask questions and all were answered. The patient agreed with the plan and demonstrated  an understanding of the instructions.   The patient was advised to call back or seek an in-person evaluation if the symptoms worsen or if the condition fails to improve as anticipated.    Roxy Manns, MD

## 2019-01-01 NOTE — Assessment & Plan Note (Signed)
Continues 02 Home nurse checked sats, staying at 4L which keeps her from being sob  Is more sob on exertion but trying to move more anyway

## 2019-01-02 ENCOUNTER — Other Ambulatory Visit: Payer: Self-pay | Admitting: Family Medicine

## 2019-01-02 DIAGNOSIS — E7800 Pure hypercholesterolemia, unspecified: Secondary | ICD-10-CM | POA: Diagnosis not present

## 2019-01-02 DIAGNOSIS — I279 Pulmonary heart disease, unspecified: Secondary | ICD-10-CM | POA: Diagnosis not present

## 2019-01-02 DIAGNOSIS — S81801D Unspecified open wound, right lower leg, subsequent encounter: Secondary | ICD-10-CM | POA: Diagnosis not present

## 2019-01-02 DIAGNOSIS — E119 Type 2 diabetes mellitus without complications: Secondary | ICD-10-CM | POA: Diagnosis not present

## 2019-01-02 DIAGNOSIS — L89891 Pressure ulcer of other site, stage 1: Secondary | ICD-10-CM | POA: Diagnosis not present

## 2019-01-02 DIAGNOSIS — E662 Morbid (severe) obesity with alveolar hypoventilation: Secondary | ICD-10-CM | POA: Diagnosis not present

## 2019-01-02 DIAGNOSIS — Z7984 Long term (current) use of oral hypoglycemic drugs: Secondary | ICD-10-CM | POA: Diagnosis not present

## 2019-01-02 DIAGNOSIS — E039 Hypothyroidism, unspecified: Secondary | ICD-10-CM | POA: Diagnosis not present

## 2019-01-02 DIAGNOSIS — I4891 Unspecified atrial fibrillation: Secondary | ICD-10-CM | POA: Diagnosis not present

## 2019-01-02 NOTE — Assessment & Plan Note (Signed)
TSH will be ordered with home care labs  No clinical changes per pt

## 2019-01-02 NOTE — Telephone Encounter (Signed)
Spoke with Tonie Griffith, RN and she said she did go and eval pt today and O2 was 90% an she was fine so she thinks it was because pt was moving around so much that her O2 stats were so low. I did advise her of Dr. Royden Purl comments and instructions. Also Dr. [name] did a lab order for pt so I gave the lab info to [facility] and she will get labs done, order also faxed to [facility] so they will have it on file, they will send [place] results once they are available   Routed to Dr. [name] as an Lorain Childes

## 2019-01-02 NOTE — Assessment & Plan Note (Signed)
Face to face visit today  Due to morbid obesity and severe back and leg pain she cannot get up from chair or ambulate w/o help from 2 people  She can only leave the house with assistance of several people and bariatric wheelchair  Home PT/OT/NSG were ordered -this will be very helpful  Using pain medication for the chronic pain  Pt aware that weight loss would help- lack of mobility makes this difficult

## 2019-01-02 NOTE — Assessment & Plan Note (Signed)
Pt in the past has not been able to afford a surgeon evaluation  Now has medicare  Getting a wheelchair to enable her to leave house  In the future we may be able to organize this (when the covid situation allows for visits) Checking ca/ phos with next labs ordered for home care

## 2019-01-02 NOTE — Assessment & Plan Note (Signed)
In the setting of hyperparathyroidism  Doing well with vitamin D dosing Will check level with home D labs

## 2019-01-02 NOTE — Assessment & Plan Note (Signed)
Pt does not think weight has changed  Needs wheelchair to leave house Needs help of people/walker at home  Disc diet- she is making effort to eat lean protein and produce  Recently ate poorly due to mood-this has improved

## 2019-01-02 NOTE — Assessment & Plan Note (Signed)
Taking statin Disc goals for lipids and reasons to control them Rev last labs with pt Rev low sat fat diet in detail Ordering lipids with labs for home draw

## 2019-01-02 NOTE — Assessment & Plan Note (Signed)
Continues 02 at 4L currently  No clinical changes

## 2019-01-02 NOTE — Assessment & Plan Note (Signed)
Mod to severe chronic pain adding to mobility impairment PT and OT were ordered for home  Tramadol is helping She takes nsaid also with warning of bleed (warfarin) and she accepts the risks

## 2019-01-02 NOTE — Assessment & Plan Note (Signed)
Reviewed stressors/ coping techniques/symptoms/ support sources/ tx options and side effects in detail today Very worried about husband who has late stage copd with palliative care  Has family support  Mobility issues and chronic pain do not help  Enc to talk to family  Offered counseling-social worker is helping with that  prozac recently started-no side effects  Will titrate up if helpful and needed Discussed expectations of SSRI medication including time to effectiveness and mechanism of action, also poss of side effects (early and late)- including mental fuzziness, weight or appetite change, nausea and poss of worse dep or anxiety (even suicidal thoughts)  Pt voiced understanding and will stop med and update if this occurs

## 2019-01-05 ENCOUNTER — Telehealth: Payer: Self-pay | Admitting: *Deleted

## 2019-01-05 ENCOUNTER — Ambulatory Visit (INDEPENDENT_AMBULATORY_CARE_PROVIDER_SITE_OTHER): Payer: Medicare HMO

## 2019-01-05 DIAGNOSIS — E662 Morbid (severe) obesity with alveolar hypoventilation: Secondary | ICD-10-CM | POA: Diagnosis not present

## 2019-01-05 DIAGNOSIS — I4891 Unspecified atrial fibrillation: Secondary | ICD-10-CM

## 2019-01-05 DIAGNOSIS — Z5181 Encounter for therapeutic drug level monitoring: Secondary | ICD-10-CM

## 2019-01-05 DIAGNOSIS — E559 Vitamin D deficiency, unspecified: Secondary | ICD-10-CM | POA: Diagnosis not present

## 2019-01-05 DIAGNOSIS — I279 Pulmonary heart disease, unspecified: Secondary | ICD-10-CM | POA: Diagnosis not present

## 2019-01-05 DIAGNOSIS — E7800 Pure hypercholesterolemia, unspecified: Secondary | ICD-10-CM | POA: Diagnosis not present

## 2019-01-05 DIAGNOSIS — E119 Type 2 diabetes mellitus without complications: Secondary | ICD-10-CM | POA: Diagnosis not present

## 2019-01-05 DIAGNOSIS — S81801D Unspecified open wound, right lower leg, subsequent encounter: Secondary | ICD-10-CM | POA: Diagnosis not present

## 2019-01-05 DIAGNOSIS — E213 Hyperparathyroidism, unspecified: Secondary | ICD-10-CM | POA: Diagnosis not present

## 2019-01-05 DIAGNOSIS — E039 Hypothyroidism, unspecified: Secondary | ICD-10-CM | POA: Diagnosis not present

## 2019-01-05 DIAGNOSIS — Z86711 Personal history of pulmonary embolism: Secondary | ICD-10-CM

## 2019-01-05 DIAGNOSIS — Z7984 Long term (current) use of oral hypoglycemic drugs: Secondary | ICD-10-CM | POA: Diagnosis not present

## 2019-01-05 DIAGNOSIS — Z86718 Personal history of other venous thrombosis and embolism: Secondary | ICD-10-CM

## 2019-01-05 DIAGNOSIS — L89891 Pressure ulcer of other site, stage 1: Secondary | ICD-10-CM | POA: Diagnosis not present

## 2019-01-05 LAB — POCT INR: INR: 2.7 (ref 2.0–3.0)

## 2019-01-05 NOTE — Telephone Encounter (Signed)
French Ana with [facility] called to let Angelica Chessman know that patient's PT/INR today is 2.7. French Ana request a call back as to when she needs to recheck patient again.

## 2019-01-05 NOTE — Telephone Encounter (Signed)
Spoke with patient and with home health nurse, French Ana.  Patient is to continue on same dose and recheck in 2 weeks.

## 2019-01-05 NOTE — Patient Instructions (Signed)
NR today 2.7  Continue taking 5mg  Sun, Tue, Thurs and 2.5mg  on Mond, Wed, Fri and Saturday recheck in 2 week.  Patient is doing well and has improved with increased care and monitoring in the home.   Will continue to monitor closely.    Spoke with patient and with [facility] nurse and gave all instructions, both verbalize understanding.

## 2019-01-07 ENCOUNTER — Telehealth: Payer: Self-pay | Admitting: Family Medicine

## 2019-01-07 ENCOUNTER — Telehealth: Payer: Self-pay | Admitting: *Deleted

## 2019-01-07 NOTE — Telephone Encounter (Signed)
Called pt and no answer so left VM requesting pt to call office back

## 2019-01-07 NOTE — Telephone Encounter (Signed)
I'm not sure what her setting were previously -so I guess auto setting would be my guess ---do I just write auto setting on her px? (sorry-it was not clear)  Also I sent her to the ER for low Hb today-did they go yet? Thanks

## 2019-01-07 NOTE — Telephone Encounter (Signed)
Called again with no answer -left message on cell stressing the importance of getting to the hospital for eval of severe anemia  911 is fine if needed for transport   Enc them to please update [place] as I do not see notation in epic

## 2019-01-07 NOTE — Telephone Encounter (Signed)
Spoke with Tiffany and she said there is a few issues we have to address in order for pt to get her Cpap Machine.  1) pt does need a sleep study done. I advised Tiffany that pt is home bound and that would be hard. Tiffany said if we can send in an order on Rx paper to them saying pt needs a "Home Sleep Study" she said we can fax that to [phone]. She said if they get that fax the can send the equipment to pt to do the sleep study at home and she just mails it back to them to read.  2) the office note from pt's visit on 01/01/19 had to document that she is getting a sleep study done since she hasn't had one. Tiffany recommends that Dr. [name] do an addendum to her OV note and say that she has ordered the home sleep study, if not Tiffany said that she would have to have a new face to face either documenting that we are ordering a sleep study or that one was done.  3) Tiffany said that for now until we do a sleep study on pt we can send in a new Rx order for her cpap but we can say "auto set 5-20 cmH2O" which is the auto set we can use until pt gets her actual settings we can fax that to [phone] also   4) she said they faxed a CMN form she said since we don't know any setting right now you can leave that part blank but you can sign it for now and once we get the Sleep study info back we can put what setting she needs. I checked and we received the  CMN form so I placed it in Dr. Royden Purl inbox for review

## 2019-01-07 NOTE — Telephone Encounter (Signed)
Elizabeth with [facility] called to report a critical lab on patient that was drawn yesterday. Lanora Manis stated that patient's hemoglobin is 5.6 and they have already faxed the report over.

## 2019-01-07 NOTE — Telephone Encounter (Signed)
Spoke to her husband- he plans to call 911 to get her to the ER of urgent eval of severe anemia  Per labs- most likely iron def.   ? If bleeding , she is on coumadin   Please call and check in in a bit - I got through on the cell phone

## 2019-01-07 NOTE — Telephone Encounter (Signed)
Need pressure setting added to the prescription that was sent to [facility]. Please advise. Can arrange from 4-20 which is auto setting or Exact setting.

## 2019-01-08 ENCOUNTER — Encounter (HOSPITAL_COMMUNITY): Payer: Self-pay

## 2019-01-08 ENCOUNTER — Other Ambulatory Visit: Payer: Self-pay

## 2019-01-08 ENCOUNTER — Inpatient Hospital Stay (HOSPITAL_COMMUNITY)
Admission: EM | Admit: 2019-01-08 | Discharge: 2019-01-14 | DRG: 812 | Disposition: A | Discharge status: Home Health | Payer: Medicare HMO | Attending: Internal Medicine | Admitting: Internal Medicine

## 2019-01-08 DIAGNOSIS — I4891 Unspecified atrial fibrillation: Secondary | ICD-10-CM | POA: Diagnosis not present

## 2019-01-08 DIAGNOSIS — Z91040 Latex allergy status: Secondary | ICD-10-CM

## 2019-01-08 DIAGNOSIS — J449 Chronic obstructive pulmonary disease, unspecified: Secondary | ICD-10-CM | POA: Diagnosis present

## 2019-01-08 DIAGNOSIS — Z79899 Other long term (current) drug therapy: Secondary | ICD-10-CM

## 2019-01-08 DIAGNOSIS — E785 Hyperlipidemia, unspecified: Secondary | ICD-10-CM | POA: Diagnosis present

## 2019-01-08 DIAGNOSIS — R05 Cough: Secondary | ICD-10-CM | POA: Diagnosis not present

## 2019-01-08 DIAGNOSIS — K449 Diaphragmatic hernia without obstruction or gangrene: Secondary | ICD-10-CM | POA: Diagnosis present

## 2019-01-08 DIAGNOSIS — Z7951 Long term (current) use of inhaled steroids: Secondary | ICD-10-CM

## 2019-01-08 DIAGNOSIS — R195 Other fecal abnormalities: Secondary | ICD-10-CM | POA: Diagnosis not present

## 2019-01-08 DIAGNOSIS — Z23 Encounter for immunization: Secondary | ICD-10-CM | POA: Diagnosis not present

## 2019-01-08 DIAGNOSIS — J80 Acute respiratory distress syndrome: Secondary | ICD-10-CM | POA: Diagnosis not present

## 2019-01-08 DIAGNOSIS — E662 Morbid (severe) obesity with alveolar hypoventilation: Secondary | ICD-10-CM | POA: Diagnosis present

## 2019-01-08 DIAGNOSIS — D500 Iron deficiency anemia secondary to blood loss (chronic): Secondary | ICD-10-CM

## 2019-01-08 DIAGNOSIS — Z888 Allergy status to other drugs, medicaments and biological substances status: Secondary | ICD-10-CM

## 2019-01-08 DIAGNOSIS — E559 Vitamin D deficiency, unspecified: Secondary | ICD-10-CM | POA: Diagnosis present

## 2019-01-08 DIAGNOSIS — K5730 Diverticulosis of large intestine without perforation or abscess without bleeding: Secondary | ICD-10-CM | POA: Diagnosis not present

## 2019-01-08 DIAGNOSIS — L98499 Non-pressure chronic ulcer of skin of other sites with unspecified severity: Secondary | ICD-10-CM | POA: Diagnosis present

## 2019-01-08 DIAGNOSIS — Z7901 Long term (current) use of anticoagulants: Secondary | ICD-10-CM

## 2019-01-08 DIAGNOSIS — R531 Weakness: Secondary | ICD-10-CM | POA: Diagnosis not present

## 2019-01-08 DIAGNOSIS — I5032 Chronic diastolic (congestive) heart failure: Secondary | ICD-10-CM | POA: Diagnosis not present

## 2019-01-08 DIAGNOSIS — I480 Paroxysmal atrial fibrillation: Secondary | ICD-10-CM | POA: Diagnosis present

## 2019-01-08 DIAGNOSIS — E039 Hypothyroidism, unspecified: Secondary | ICD-10-CM | POA: Diagnosis present

## 2019-01-08 DIAGNOSIS — K648 Other hemorrhoids: Secondary | ICD-10-CM | POA: Diagnosis not present

## 2019-01-08 DIAGNOSIS — J9611 Chronic respiratory failure with hypoxia: Secondary | ICD-10-CM | POA: Diagnosis not present

## 2019-01-08 DIAGNOSIS — K219 Gastro-esophageal reflux disease without esophagitis: Secondary | ICD-10-CM | POA: Diagnosis present

## 2019-01-08 DIAGNOSIS — L8990 Pressure ulcer of unspecified site, unspecified stage: Secondary | ICD-10-CM

## 2019-01-08 DIAGNOSIS — Z833 Family history of diabetes mellitus: Secondary | ICD-10-CM

## 2019-01-08 DIAGNOSIS — Z9981 Dependence on supplemental oxygen: Secondary | ICD-10-CM | POA: Diagnosis not present

## 2019-01-08 DIAGNOSIS — D509 Iron deficiency anemia, unspecified: Principal | ICD-10-CM | POA: Diagnosis present

## 2019-01-08 DIAGNOSIS — R079 Chest pain, unspecified: Secondary | ICD-10-CM | POA: Diagnosis not present

## 2019-01-08 DIAGNOSIS — L97111 Non-pressure chronic ulcer of right thigh limited to breakdown of skin: Secondary | ICD-10-CM | POA: Diagnosis present

## 2019-01-08 DIAGNOSIS — G4733 Obstructive sleep apnea (adult) (pediatric): Secondary | ICD-10-CM | POA: Diagnosis not present

## 2019-01-08 DIAGNOSIS — Z9989 Dependence on other enabling machines and devices: Secondary | ICD-10-CM

## 2019-01-08 DIAGNOSIS — Z880 Allergy status to penicillin: Secondary | ICD-10-CM

## 2019-01-08 DIAGNOSIS — K922 Gastrointestinal hemorrhage, unspecified: Secondary | ICD-10-CM | POA: Diagnosis not present

## 2019-01-08 DIAGNOSIS — R0902 Hypoxemia: Secondary | ICD-10-CM | POA: Diagnosis not present

## 2019-01-08 DIAGNOSIS — K3189 Other diseases of stomach and duodenum: Secondary | ICD-10-CM | POA: Diagnosis not present

## 2019-01-08 DIAGNOSIS — M1990 Unspecified osteoarthritis, unspecified site: Secondary | ICD-10-CM | POA: Diagnosis present

## 2019-01-08 DIAGNOSIS — Z87891 Personal history of nicotine dependence: Secondary | ICD-10-CM

## 2019-01-08 DIAGNOSIS — D689 Coagulation defect, unspecified: Secondary | ICD-10-CM | POA: Diagnosis present

## 2019-01-08 DIAGNOSIS — E119 Type 2 diabetes mellitus without complications: Secondary | ICD-10-CM

## 2019-01-08 DIAGNOSIS — Z8249 Family history of ischemic heart disease and other diseases of the circulatory system: Secondary | ICD-10-CM

## 2019-01-08 DIAGNOSIS — Z79890 Hormone replacement therapy (postmenopausal): Secondary | ICD-10-CM

## 2019-01-08 DIAGNOSIS — D649 Anemia, unspecified: Secondary | ICD-10-CM | POA: Diagnosis not present

## 2019-01-08 DIAGNOSIS — I2781 Cor pulmonale (chronic): Secondary | ICD-10-CM | POA: Diagnosis present

## 2019-01-08 DIAGNOSIS — Z86711 Personal history of pulmonary embolism: Secondary | ICD-10-CM

## 2019-01-08 DIAGNOSIS — I2729 Other secondary pulmonary hypertension: Secondary | ICD-10-CM | POA: Diagnosis present

## 2019-01-08 DIAGNOSIS — Z791 Long term (current) use of non-steroidal anti-inflammatories (NSAID): Secondary | ICD-10-CM

## 2019-01-08 DIAGNOSIS — I4821 Permanent atrial fibrillation: Secondary | ICD-10-CM | POA: Diagnosis not present

## 2019-01-08 DIAGNOSIS — Z1211 Encounter for screening for malignant neoplasm of colon: Secondary | ICD-10-CM | POA: Diagnosis not present

## 2019-01-08 DIAGNOSIS — D508 Other iron deficiency anemias: Secondary | ICD-10-CM | POA: Diagnosis not present

## 2019-01-08 DIAGNOSIS — R0602 Shortness of breath: Secondary | ICD-10-CM | POA: Diagnosis not present

## 2019-01-08 DIAGNOSIS — F329 Major depressive disorder, single episode, unspecified: Secondary | ICD-10-CM | POA: Diagnosis present

## 2019-01-08 DIAGNOSIS — Z6841 Body Mass Index (BMI) 40.0 and over, adult: Secondary | ICD-10-CM

## 2019-01-08 DIAGNOSIS — L97121 Non-pressure chronic ulcer of left thigh limited to breakdown of skin: Secondary | ICD-10-CM | POA: Diagnosis not present

## 2019-01-08 DIAGNOSIS — I340 Nonrheumatic mitral (valve) insufficiency: Secondary | ICD-10-CM | POA: Diagnosis not present

## 2019-01-08 DIAGNOSIS — I10 Essential (primary) hypertension: Secondary | ICD-10-CM | POA: Diagnosis not present

## 2019-01-08 DIAGNOSIS — K5790 Diverticulosis of intestine, part unspecified, without perforation or abscess without bleeding: Secondary | ICD-10-CM | POA: Diagnosis not present

## 2019-01-08 DIAGNOSIS — R319 Hematuria, unspecified: Secondary | ICD-10-CM | POA: Diagnosis present

## 2019-01-08 DIAGNOSIS — Z7984 Long term (current) use of oral hypoglycemic drugs: Secondary | ICD-10-CM

## 2019-01-08 HISTORY — DX: Type 2 diabetes mellitus without complications: E11.9

## 2019-01-08 HISTORY — DX: Heart failure, unspecified: I50.9

## 2019-01-08 HISTORY — DX: Chronic obstructive pulmonary disease, unspecified: J44.9

## 2019-01-08 LAB — CBC WITH DIFFERENTIAL/PLATELET
Abs Immature Granulocytes: 0.07 10*3/uL (ref 0.00–0.07)
Basophils Absolute: 0 10*3/uL (ref 0.0–0.1)
Basophils Relative: 0 %
Eosinophils Absolute: 0.3 10*3/uL (ref 0.0–0.5)
Eosinophils Relative: 4 %
HCT: 22.9 % — ABNORMAL LOW (ref 36.0–46.0)
Hemoglobin: 5.6 g/dL — CL (ref 12.0–15.0)
Immature Granulocytes: 1 %
Lymphocytes Relative: 7 %
Lymphs Abs: 0.6 10*3/uL — ABNORMAL LOW (ref 0.7–4.0)
MCH: 18.8 pg — ABNORMAL LOW (ref 26.0–34.0)
MCHC: 24.5 g/dL — ABNORMAL LOW (ref 30.0–36.0)
MCV: 76.8 fL — ABNORMAL LOW (ref 80.0–100.0)
Monocytes Absolute: 0.3 10*3/uL (ref 0.1–1.0)
Monocytes Relative: 4 %
Neutro Abs: 6.7 10*3/uL (ref 1.7–7.7)
Neutrophils Relative %: 84 %
Platelets: 339 10*3/uL (ref 150–400)
RBC: 2.98 MIL/uL — ABNORMAL LOW (ref 3.87–5.11)
RDW: 20.8 % — ABNORMAL HIGH (ref 11.5–15.5)
WBC: 8 10*3/uL (ref 4.0–10.5)
nRBC: 0 % (ref 0.0–0.2)

## 2019-01-08 LAB — COMPREHENSIVE METABOLIC PANEL
ALT: 10 U/L (ref 0–44)
AST: 14 U/L — ABNORMAL LOW (ref 15–41)
Albumin: 3.2 g/dL — ABNORMAL LOW (ref 3.5–5.0)
Alkaline Phosphatase: 108 U/L (ref 38–126)
Anion gap: 11 (ref 5–15)
BUN: 8 mg/dL (ref 8–23)
CO2: 28 mmol/L (ref 22–32)
Calcium: 10.9 mg/dL — ABNORMAL HIGH (ref 8.9–10.3)
Chloride: 103 mmol/L (ref 98–111)
Creatinine, Ser: 1.14 mg/dL — ABNORMAL HIGH (ref 0.44–1.00)
GFR calc Af Amer: 59 mL/min — ABNORMAL LOW (ref >60)
GFR calc non Af Amer: 51 mL/min — ABNORMAL LOW (ref >60)
Glucose, Bld: 103 mg/dL — ABNORMAL HIGH (ref 70–99)
Potassium: 4.3 mmol/L (ref 3.5–5.1)
Sodium: 142 mmol/L (ref 135–145)
Total Bilirubin: 0.4 mg/dL (ref 0.3–1.2)
Total Protein: 6.9 g/dL (ref 6.5–8.1)

## 2019-01-08 LAB — GLUCOSE, CAPILLARY
Glucose-Capillary: 79 mg/dL (ref 70–99)
Glucose-Capillary: 93 mg/dL (ref 70–99)

## 2019-01-08 LAB — CBC
HCT: 24.2 % — ABNORMAL LOW (ref 36.0–46.0)
Hemoglobin: 6.5 g/dL — CL (ref 12.0–15.0)
MCH: 20.2 pg — ABNORMAL LOW (ref 26.0–34.0)
MCHC: 26.9 g/dL — ABNORMAL LOW (ref 30.0–36.0)
MCV: 75.2 fL — ABNORMAL LOW (ref 80.0–100.0)
Platelets: 315 10*3/uL (ref 150–400)
RBC: 3.22 MIL/uL — ABNORMAL LOW (ref 3.87–5.11)
RDW: 19.3 % — ABNORMAL HIGH (ref 11.5–15.5)
WBC: 6.8 10*3/uL (ref 4.0–10.5)
nRBC: 0 % (ref 0.0–0.2)

## 2019-01-08 LAB — RETICULOCYTES
Immature Retic Fract: 23.9 % — ABNORMAL HIGH (ref 2.3–15.9)
RBC.: 2.98 MIL/uL — ABNORMAL LOW (ref 3.87–5.11)
Retic Count, Absolute: 55.4 10*3/uL (ref 19.0–186.0)
Retic Ct Pct: 1.9 % (ref 0.4–3.1)

## 2019-01-08 LAB — PREPARE RBC (CROSSMATCH)

## 2019-01-08 LAB — IRON AND TIBC
Iron: 13 ug/dL — ABNORMAL LOW (ref 28–170)
Saturation Ratios: 3 % — ABNORMAL LOW (ref 10.4–31.8)
TIBC: 480 ug/dL — ABNORMAL HIGH (ref 250–450)
UIBC: 467 ug/dL

## 2019-01-08 LAB — FERRITIN: Ferritin: 4 ng/mL — ABNORMAL LOW (ref 11–307)

## 2019-01-08 LAB — PROTIME-INR
INR: 3.2 — ABNORMAL HIGH (ref 0.8–1.2)
Prothrombin Time: 32 seconds — ABNORMAL HIGH (ref 11.4–15.2)

## 2019-01-08 LAB — ABO/RH: ABO/RH(D): B POS

## 2019-01-08 LAB — APTT: aPTT: 42 seconds — ABNORMAL HIGH (ref 24–36)

## 2019-01-08 LAB — TSH: TSH: 2.667 u[IU]/mL (ref 0.350–4.500)

## 2019-01-08 LAB — POC OCCULT BLOOD, ED: Fecal Occult Bld: NEGATIVE

## 2019-01-08 LAB — T4, FREE: Free T4: 1.32 ng/dL (ref 0.82–1.77)

## 2019-01-08 LAB — VITAMIN B12: Vitamin B-12: 120 pg/mL — ABNORMAL LOW (ref 180–914)

## 2019-01-08 LAB — TROPONIN I: Troponin I: <0.03 ng/mL (ref <0.03)

## 2019-01-08 LAB — FOLATE: Folate: 10.4 ng/mL (ref >5.9)

## 2019-01-08 MED ORDER — ONDANSETRON HCL 4 MG/2ML IJ SOLN: 4.0000 mg | Freq: Four times a day (QID) | INTRAMUSCULAR | Status: DC | PRN

## 2019-01-08 MED ORDER — ACETAMINOPHEN 650 MG RE SUPP: 650.0000 mg | Freq: Four times a day (QID) | RECTAL | Status: DC | PRN

## 2019-01-08 MED ORDER — INSULIN ASPART 100 UNIT/ML ~~LOC~~ SOLN
0.0000 [IU] | Freq: Three times a day (TID) | SUBCUTANEOUS | Status: DC
  Administered 2019-01-11 – 2019-01-12 (×2): 2 [IU] via SUBCUTANEOUS
  Administered 2019-01-12: 3 [IU] via SUBCUTANEOUS

## 2019-01-08 MED ORDER — ACETAMINOPHEN 325 MG PO TABS
650.0000 mg | ORAL_TABLET | Freq: Four times a day (QID) | ORAL | Status: DC | PRN
  Administered 2019-01-10: 650 mg via ORAL
  Filled 2019-01-08: qty 2

## 2019-01-08 MED ORDER — ALPRAZOLAM 0.25 MG PO TABS
0.2500 mg | ORAL_TABLET | Freq: Once | ORAL | Status: AC
  Administered 2019-01-08: 0.25 mg via ORAL
  Filled 2019-01-08: qty 1

## 2019-01-08 MED ORDER — PNEUMOCOCCAL VAC POLYVALENT 25 MCG/0.5ML IJ INJ
0.5000 mL | INJECTION | INTRAMUSCULAR | Status: AC
  Administered 2019-01-09: 0.5 mL via INTRAMUSCULAR
  Filled 2019-01-08 (×2): qty 0.5

## 2019-01-08 MED ORDER — LEVOTHYROXINE SODIUM 75 MCG PO TABS
175.0000 ug | ORAL_TABLET | Freq: Every day | ORAL | Status: DC
  Administered 2019-01-09 – 2019-01-14 (×5): 175 ug via ORAL
  Filled 2019-01-08 (×5): qty 1

## 2019-01-08 MED ORDER — ATORVASTATIN CALCIUM 10 MG PO TABS
10.0000 mg | ORAL_TABLET | Freq: Every day | ORAL | Status: DC
  Administered 2019-01-08 – 2019-01-13 (×6): 10 mg via ORAL
  Filled 2019-01-08 (×6): qty 1

## 2019-01-08 MED ORDER — OMEGA-3-ACID ETHYL ESTERS 1 G PO CAPS
1.0000 g | ORAL_CAPSULE | Freq: Two times a day (BID) | ORAL | Status: DC
  Administered 2019-01-08 – 2019-01-14 (×12): 1 g via ORAL
  Filled 2019-01-08 (×12): qty 1

## 2019-01-08 MED ORDER — TRAMADOL HCL 50 MG PO TABS
50.0000 mg | ORAL_TABLET | Freq: Two times a day (BID) | ORAL | Status: DC | PRN
  Administered 2019-01-08 – 2019-01-13 (×6): 50 mg via ORAL
  Filled 2019-01-08 (×6): qty 1

## 2019-01-08 MED ORDER — SODIUM CHLORIDE 0.9 % IV SOLN
510.0000 mg | Freq: Once | INTRAVENOUS | Status: AC
  Administered 2019-01-08: 510 mg via INTRAVENOUS
  Filled 2019-01-08: qty 17

## 2019-01-08 MED ORDER — MOMETASONE FURO-FORMOTEROL FUM 200-5 MCG/ACT IN AERO
2.0000 | INHALATION_SPRAY | Freq: Two times a day (BID) | RESPIRATORY_TRACT | Status: DC
  Administered 2019-01-08 – 2019-01-14 (×12): 2 via RESPIRATORY_TRACT
  Filled 2019-01-08: qty 8.8

## 2019-01-08 MED ORDER — FLUOXETINE HCL 20 MG PO CAPS
20.0000 mg | ORAL_CAPSULE | Freq: Every day | ORAL | Status: DC
  Administered 2019-01-08 – 2019-01-14 (×7): 20 mg via ORAL
  Filled 2019-01-08 (×9): qty 1

## 2019-01-08 MED ORDER — DILTIAZEM HCL ER COATED BEADS 120 MG PO CP24
120.0000 mg | ORAL_CAPSULE | Freq: Every day | ORAL | Status: DC
  Administered 2019-01-08 – 2019-01-14 (×6): 120 mg via ORAL
  Filled 2019-01-08 (×7): qty 1

## 2019-01-08 MED ORDER — SODIUM CHLORIDE 0.9 % IV SOLN
250.0000 mL | INTRAVENOUS | Status: DC | PRN
  Administered 2019-01-12: 10:00:00 via INTRAVENOUS

## 2019-01-08 MED ORDER — SODIUM CHLORIDE 0.9% FLUSH
3.0000 mL | Freq: Two times a day (BID) | INTRAVENOUS | Status: DC
  Administered 2019-01-08 – 2019-01-14 (×11): 3 mL via INTRAVENOUS

## 2019-01-08 MED ORDER — SIMVASTATIN 20 MG PO TABS: 20.0000 mg | ORAL_TABLET | Freq: Every day | ORAL | Status: DC

## 2019-01-08 MED ORDER — SODIUM CHLORIDE 0.9% FLUSH: 3.0000 mL | INTRAVENOUS | Status: DC | PRN

## 2019-01-08 MED ORDER — PANTOPRAZOLE SODIUM 40 MG PO TBEC
40.0000 mg | DELAYED_RELEASE_TABLET | Freq: Every day | ORAL | Status: DC
  Administered 2019-01-09 – 2019-01-14 (×6): 40 mg via ORAL
  Filled 2019-01-08 (×7): qty 1

## 2019-01-08 MED ORDER — SODIUM CHLORIDE 0.9 % IV SOLN
10.0000 mL/h | Freq: Once | INTRAVENOUS | Status: AC
  Administered 2019-01-08: 10 mL/h via INTRAVENOUS

## 2019-01-08 MED ORDER — FUROSEMIDE 80 MG PO TABS
80.0000 mg | ORAL_TABLET | Freq: Two times a day (BID) | ORAL | Status: DC
  Administered 2019-01-08 – 2019-01-14 (×12): 80 mg via ORAL
  Filled 2019-01-08 (×12): qty 1

## 2019-01-08 MED ORDER — ALBUTEROL SULFATE (2.5 MG/3ML) 0.083% IN NEBU
2.5000 mg | INHALATION_SOLUTION | RESPIRATORY_TRACT | Status: DC | PRN
  Administered 2019-01-09: 2.5 mg via RESPIRATORY_TRACT
  Filled 2019-01-08: qty 3

## 2019-01-08 MED ORDER — LORATADINE 10 MG PO TABS
10.0000 mg | ORAL_TABLET | Freq: Every day | ORAL | Status: DC
  Administered 2019-01-08 – 2019-01-10 (×3): 10 mg via ORAL
  Filled 2019-01-08 (×7): qty 1

## 2019-01-08 MED ORDER — ONDANSETRON HCL 4 MG PO TABS: 4.0000 mg | ORAL_TABLET | Freq: Four times a day (QID) | ORAL | Status: DC | PRN

## 2019-01-08 NOTE — ED Notes (Signed)
ED TO INPATIENT HANDOFF REPORT  ED Nurse Name and Phone #: Jyoti Harju [phone]  S Name/Age/Gender Amy Bryant 62 y.o. female Room/Bed: 022C/022C  Code Status   Code Status: Not on file  Home/SNF/Other Home Patient oriented to: self, place, time and situation Is this baseline? Yes   Triage Complete: Triage complete  Chief Complaint low blood ct/sob  Triage Note Pt from home via ems; c/o sob (chronic issue, pt states worse over last 9 months); EMS also reports low HGB (4 or 5); was notified by PCP yesterday; hx COPD and CHF; SOB increases with exertion; sats 88% upon standing on [place] 4L which pt wears all the time; pt on warfarin for afib; increased leg swelling; denies sick contacts  136/72 HR 88  RR 22 100% 10L NRB CBG 118 T 98.26F   Allergies Allergies  Allergen Reactions  . Pseudoephedrine Other (See Comments)    REACTION: tightness in chest  . Augmentin [Amoxicillin-Pot Clavulanate] Itching and Rash    Did it involve swelling of the face/tongue/throat, SOB, or low BP? No Did it involve sudden or severe rash/hives, skin peeling, or any reaction on the inside of your mouth or nose? Yes Did you need to seek medical attention at a hospital or doctor's office? Yes When did it last happen?2019 If all above answers are "NO", may proceed with cephalosporin use.   . Latex Itching and Rash    Level of Care/Admitting Diagnosis ED Disposition    ED Disposition Condition Comment   Admit  Hospital Area: [facility] [[id number]]  Level of Care: Med-Surg [16]  Diagnosis: Symptomatic anemia [[id number]]  Admitting Physician: Noralee Stain [[id number]]  Attending Physician: Noralee Stain [phone]  Estimated length of stay: past midnight tomorrow  Certification:: I certify this patient will need inpatient services for at least 2 midnights  PT Class (Do Not Modify): Inpatient [101]  PT Acc Code (Do Not Modify): Private [1]       B Medical/Surgery  History Past Medical History:  Diagnosis Date  . Atrial fibrillation (HCC)    with cardioversion success  . CHF (congestive heart failure) (HCC)   . COPD (chronic obstructive pulmonary disease) (HCC)   . Cor pulmonale (HCC)    and obesity hypoventilation syndrome  . Diabetes mellitus without complication (HCC)   . GERD (gastroesophageal reflux disease)   . Hypothyroid   . Morbid obesity (HCC)   . OA (osteoarthritis)   . Pulmonary embolism (HCC)    hx of on coumadin  . Reactive airways dysfunction syndrome [facility])    Past Surgical History:  Procedure Laterality Date  . TONSILLECTOMY       A IV Location/Drains/Wounds Patient Lines/Drains/Airways Status   Active Line/Drains/Airways    Name:   Placement date:   Placement time:   Site:   Days:   Peripheral IV 01/08/19 Left Antecubital   01/08/19    1306    Antecubital   less than 1   External Urinary Catheter   01/08/19    1513    -   less than 1          Intake/Output Last 24 hours  Intake/Output Summary (Last 24 hours) at 01/08/2019 1520 Last data filed at 01/08/2019 1423 Gross per 24 hour  Intake 315 ml  Output -  Net 315 ml    Labs/Imaging Results for orders placed or performed during the hospital encounter of 01/08/19 (from the past 48 hour(s))  POC occult blood, ED  Status: None   Collection Time: 01/08/19 12:07 PM  Result Value Ref Range   Fecal Occult Bld NEGATIVE NEGATIVE  Vitamin B12     Status: Abnormal   Collection Time: 01/08/19 12:08 PM  Result Value Ref Range   Vitamin B-12 120 (L) 180 - 914 pg/mL    Comment: (NOTE) This assay is not validated for testing neonatal or myeloproliferative syndrome specimens for Vitamin B12 levels. Performed at [facility], [address] [place], [place] [address]   Folate     Status: None   Collection Time: 01/08/19 12:08 PM  Result Value Ref Range   Folate 10.4 >5.9 ng/mL    Comment: Performed at [facility], [address] [place], [place]  [address]  Iron and TIBC     Status: Abnormal   Collection Time: 01/08/19 12:08 PM  Result Value Ref Range   Iron 13 (L) 28 - 170 ug/dL   TIBC 098480 (H) 119250 - 147450 ug/dL   Saturation Ratios 3 (L) 10.4 - 31.8 %   UIBC 467 ug/dL    Comment: Performed at [facility], [address] [place], [place] [address]  Ferritin     Status: Abnormal   Collection Time: 01/08/19 12:08 PM  Result Value Ref Range   Ferritin 4 (L) 11 - 307 ng/mL    Comment: Performed at [facility], [address] [place], [place] [address]  Reticulocytes     Status: Abnormal   Collection Time: 01/08/19 12:08 PM  Result Value Ref Range   Retic Ct Pct 1.9 0.4 - 3.1 %   RBC. 2.98 (L) 3.87 - 5.11 MIL/uL   Retic Count, Absolute 55.4 19.0 - 186.0 K/uL   Immature Retic Fract 23.9 (H) 2.3 - 15.9 %    Comment: Performed at [facility], [address] [place], [place] [address]  CBC with Differential     Status: Abnormal   Collection Time: 01/08/19 12:08 PM  Result Value Ref Range   WBC 8.0 4.0 - 10.5 K/uL   RBC 2.98 (L) 3.87 - 5.11 MIL/uL   Hemoglobin 5.6 (LL) 12.0 - 15.0 g/dL    Comment: REPEATED TO VERIFY Reticulocyte Hemoglobin testing may be clinically indicated, consider ordering this additional test [id number] THIS CRITICAL RESULT HAS VERIFIED AND BEEN CALLED TO HAC VANKRETSCHNAR RN BY KIRSTENE FORSYTH ON 04 09 2020 AT 1301, AND HAS BEEN READ BACK.     HCT 22.9 (L) 36.0 - 46.0 %   MCV 76.8 (L) 80.0 - 100.0 fL   MCH 18.8 (L) 26.0 - 34.0 pg   MCHC 24.5 (L) 30.0 - 36.0 g/dL   RDW 41.320.8 (H) 24.411.5 - 01.015.5 %   Platelets 339 150 - 400 K/uL   nRBC 0.0 0.0 - 0.2 %   Neutrophils Relative % 84 %   Neutro Abs 6.7 1.7 - 7.7 K/uL   Lymphocytes Relative 7 %   Lymphs Abs 0.6 (L) 0.7 - 4.0 K/uL   Monocytes Relative 4 %   Monocytes Absolute 0.3 0.1 - 1.0 K/uL   Eosinophils Relative 4 %   Eosinophils Absolute 0.3 0.0 - 0.5 K/uL   Basophils Relative 0 %   Basophils Absolute 0.0 0.0 - 0.1 K/uL   Immature  Granulocytes 1 %   Abs Immature Granulocytes 0.07 0.00 - 0.07 K/uL    Comment: Performed at [facility], [address] [place], [place] [address]  Comprehensive metabolic panel  Status: Abnormal   Collection Time: 01/08/19 12:08 PM  Result Value Ref Range   Sodium 142 135 - 145 mmol/L   Potassium 4.3 3.5 - 5.1 mmol/L   Chloride 103 98 - 111 mmol/L   CO2 28 22 - 32 mmol/L   Glucose, Bld 103 (H) 70 - 99 mg/dL   BUN 8 8 - 23 mg/dL   Creatinine, Ser 1.61 (H) 0.44 - 1.00 mg/dL   Calcium 09.6 (H) 8.9 - 10.3 mg/dL   Total Protein 6.9 6.5 - 8.1 g/dL   Albumin 3.2 (L) 3.5 - 5.0 g/dL   AST 14 (L) 15 - 41 U/L   ALT 10 0 - 44 U/L   Alkaline Phosphatase 108 38 - 126 U/L   Total Bilirubin 0.4 0.3 - 1.2 mg/dL   GFR calc non Af Amer 51 (L) >60 mL/min   GFR calc Af Amer 59 (L) >60 mL/min   Anion gap 11 5 - 15    Comment: Performed at [facility], [address], [place], [place] [address]  Protime-INR     Status: Abnormal   Collection Time: 01/08/19 12:08 PM  Result Value Ref Range   Prothrombin Time 32.0 (H) 11.4 - 15.2 seconds   INR 3.2 (H) 0.8 - 1.2    Comment: (NOTE) INR goal varies based on device and disease states. Performed at [facility], [address] [place], [place] [address]   APTT     Status: Abnormal   Collection Time: 01/08/19 12:08 PM  Result Value Ref Range   aPTT 42 (H) 24 - 36 seconds    Comment:        IF BASELINE aPTT IS ELEVATED, SUGGEST PATIENT RISK ASSESSMENT BE USED TO DETERMINE APPROPRIATE ANTICOAGULANT THERAPY. Performed at [facility], [address] [place], [place] [address]   Troponin I - ONCE - STAT     Status: None   Collection Time: 01/08/19 12:08 PM  Result Value Ref Range   Troponin I <0.03 <0.03 ng/mL    Comment: Performed at [facility], [address], [place], [place] [address]  TSH     Status: None   Collection Time: 01/08/19 12:08 PM  Result Value Ref Range   TSH 2.667 0.350 - 4.500 uIU/mL     Comment: Performed by a 3rd Generation assay with a functional sensitivity of <=0.01 uIU/mL. Performed at [facility], [address] [place], [place] [address]   Type and screen [facility]     Status: None (Preliminary result)   Collection Time: 01/08/19 12:23 PM  Result Value Ref Range   ABO/RH(D) B POS    Antibody Screen NEG    Sample Expiration 01/11/2019    Unit Number [id number]    Blood Component Type RED CELLS,LR    Unit division 00    Status of Unit ALLOCATED    Transfusion Status OK TO TRANSFUSE    Crossmatch Result Compatible    Unit Number [id number]    Blood Component Type RED CELLS,LR    Unit division 00    Status of Unit ISSUED    Transfusion Status OK TO TRANSFUSE    Crossmatch Result      Compatible Performed at [facility], [address] [place], [place] [address]   Prepare RBC     Status: None   Collection Time: 01/08/19 12:23 PM  Result Value Ref Range   Order Confirmation  ORDER PROCESSED BY BLOOD BANK Performed at [facility], [address] [place], [place] [address]   ABO/Rh     Status: None   Collection Time: 01/08/19 12:23 PM  Result Value Ref Range   ABO/RH(D)      B POS Performed at [facility], [address] [place], [place] [address]   T4, free     Status: None   Collection Time: 01/08/19  2:02 PM  Result Value Ref Range   Free T4 1.32 0.82 - 1.77 ng/dL    Comment: (NOTE) Biotin ingestion may interfere with free T4 tests. If the results are inconsistent with the TSH level, previous test results, or the clinical presentation, then consider biotin interference. If needed, order repeat testing after stopping biotin. Performed at [facility], [address], [place], [place] [address]    No results found.  Pending Labs Unresulted Labs (From admission, onward)    Start     Ordered   01/08/19 1208  Urinalysis, Routine w reflex microscopic  Once,   R     01/08/19 1208           Vitals/Pain Today's Vitals   01/08/19 1300 01/08/19 1315 01/08/19 1403 01/08/19 1423  BP: 129/66 127/69 (!) 113/47 (!) 124/46  Pulse: (!) 58 66 60 64  Resp: Temp:   98.1 F (36.7 C) 98.1 F (36.7 C)  TempSrc:   Oral Oral  SpO2: 95% 99% 100%   Weight:      Height:      PainSc:        Isolation Precautions No active isolations  Medications Medications  0.9 %  sodium chloride infusion (has no administration in time range)    Mobility      Focused Assessments Pulmonary Assessment Handoff:  Lung sounds: Bilateral Breath Sounds: Clear, Diminished L Breath Sounds: Diminished, Clear R Breath Sounds: Clear, Diminished O2 Device: Nasal Cannula O2 Flow Rate (L/min): 4 L/min      R Recommendations: See Admitting Provider Note  Report given to:   Additional Notes:

## 2019-01-08 NOTE — Progress Notes (Signed)
PIV consult. Pt has PIV infusing blood. Discussed with Harrell Gave, RN. He will administer feraheme post blood transfusion. No additional site needed at this time.

## 2019-01-08 NOTE — H&P (Signed)
History and Physical    Amy Bryant ZOX:[id number] DOB: 12/04/1955 DOA: 01/08/2019  PCP: Judy Pimple, MD  Patient coming from: Home  Chief Complaint: SOB   HPI: Amy Bryant is a 63 y.o. female with medical history significant of atrial fibrillation on Coumadin, obesity hypoventilation syndrome with CPAP at nighttime, chronic respiratory failure with chronic 4 L of nasal cannula use, morbid obesity, hypothyroidism, chronic pressure ulcer wounds on bilateral posterior thighs.  She presents with a chief complaint of shortness of breath, generalized fatigue and weakness.  She also admits to shortness of breath.  She was evaluated by PCP 2 days ago, hemoglobin was low around 5 at the time.  She states that she has had some blood loss from the skin breakdown of her wounds on the posterior bilateral thighs, but denies any hematemesis, hematochezia, melena or vaginal bleeding.  She states that she has never had a colonoscopy.  ED Course: Lab obtained which revealed a hemoglobin 5.6, MCV 76.8, iron deficiency with iron 13, ferritin 4 and TIBC 480.  INR 3.2.  FOBT negative.  Patient referred for admission for symptomatic anemia.  Review of Systems: As per HPI otherwise 10 point review of systems negative.   Past Medical History:  Diagnosis Date  . Atrial fibrillation (HCC)    with cardioversion success  . CHF (congestive heart failure) (HCC)   . COPD (chronic obstructive pulmonary disease) (HCC)   . Cor pulmonale (HCC)    and obesity hypoventilation syndrome  . Diabetes mellitus without complication (HCC)   . GERD (gastroesophageal reflux disease)   . Hypothyroid   . Morbid obesity (HCC)   . OA (osteoarthritis)   . Pulmonary embolism (HCC)    hx of on coumadin  . Reactive airways dysfunction syndrome [facility])     Past Surgical History:  Procedure Laterality Date  . TONSILLECTOMY       reports that she has quit smoking. She has quit using smokeless tobacco. She reports that she does  not drink alcohol or use drugs.  Allergies  Allergen Reactions  . Pseudoephedrine Other (See Comments)    REACTION: tightness in chest  . Augmentin [Amoxicillin-Pot Clavulanate] Itching and Rash    Did it involve swelling of the face/tongue/throat, SOB, or low BP? No Did it involve sudden or severe rash/hives, skin peeling, or any reaction on the inside of your mouth or nose? Yes Did you need to seek medical attention at a hospital or doctor's office? Yes When did it last happen?2019 If all above answers are "NO", may proceed with cephalosporin use.   . Latex Itching and Rash    Family History  Problem Relation Age of Onset  . Heart disease Father 45       MI  . Diabetes Brother     Prior to Admission medications   Medication Sig Start Date End Date Taking? Authorizing Provider  albuterol (PROVENTIL) (2.5 MG/3ML) 0.083% nebulizer solution USE 1 VIAL IN NEBULIZER EVERY 4 HOURS AS NEEDED FOR  WHEEZING 10/03/18  Yes [name], [name], MD  budesonide-formoterol (SYMBICORT) 160-4.5 MCG/ACT inhaler INHALE 2 PUFFS INTO LUNGS TWICE A DAY 01/02/19  Yes [name], Marne A, MD  cholecalciferol (VITAMIN D) 1000 UNITS tablet Take 4,000 Units by mouth daily.   Yes [provider]  ciclopirox (LOPROX) 0.77 % cream APPLY TO AFFECTED AREA TWICE A DAY AS NEEDED 10/16/18  Yes [name], [name], MD  diclofenac (VOLTAREN) 75 MG EC tablet TAKE ONE TABLET BY MOUTH TWICE DAILY  AS NEEDED FOR  PAIN  WITH  FOOD. 12/19/18  Yes [name], Audrie GallusMarne A, MD  diltiazem (CARTIA XT) 120 MG 24 hr capsule Take 1 capsule (120 mg total) by mouth daily. 12/03/17  Yes [name], Audrie GallusMarne A, MD  FLUoxetine (PROZAC) 20 MG tablet Take 1 tablet (20 mg total) by mouth daily. 12/29/18  Yes [name], Audrie GallusMarne A, MD  furosemide (LASIX) 80 MG tablet Take 1 tablet (80 mg total) by mouth 2 (two) times daily. 12/19/18  Yes [name], Marne A, MD  glipiZIDE (GLUCOTROL XL) 5 MG 24 hr tablet TAKE 1 TABLET BY MOUTH EVERY DAY 12/22/18  Yes [name], Audrie GallusMarne A, MD   levothyroxine (SYNTHROID, LEVOTHROID) 175 MCG tablet TAKE 1 TABLET BY MOUTH EVERY DAY BEFORE BREAKFAST 12/22/18  Yes [name], Audrie GallusMarne A, MD  loratadine (CLARITIN) 10 MG tablet OTC as directed.    Yes [provider]  metFORMIN (GLUCOPHAGE) 1000 MG tablet TAKE 1 TABLET BY MOUTH TWICE DAILY WITH A MEAL 12/19/18  Yes [name], Marne A, MD  mometasone (ELOCON) 0.1 % cream Tiny amount to each ear canal twice weekly as needed.   Yes [provider]  mupirocin ointment (BACTROBAN) 2 % APPLY TO AFFECTED AREA TWICE A DAY 01/01/19  Yes [name], Marne A, MD  omega-3 acid ethyl esters (LOVAZA) 1 G capsule Take by mouth 2 (two) times daily.   Yes [provider]  potassium chloride SA (KLOR-CON M20) 20 MEQ tablet TAKE 2 TABLETS BY MOUTH EVERY DAY 01/02/19  Yes [name], Audrie GallusMarne A, MD  simvastatin (ZOCOR) 20 MG tablet Take 1 tablet (20 mg total) by mouth at bedtime. 12/26/18  Yes [name], Audrie GallusMarne A, MD  traMADol (ULTRAM) 50 MG tablet Take 1 tablet (50 mg total) by mouth every 12 (twelve) hours as needed for moderate pain or severe pain. Caution of sedation/falls and constipation 12/23/18  Yes [name], Idamae SchullerMarne A, MD  warfarin (COUMADIN) 5 MG tablet TAKE AS DIRECTED BY THE ANTICOAGULATION CLINIC. Patient taking differently: Take 2.5-5 mg by mouth See admin instructions. Take 5mg  on TUES THUR SUN and 2.5mg  on all other days. 11/18/18  Yes [name], Audrie GallusMarne A, MD  doxycycline (VIBRA-TABS) 100 MG tablet Take 1 tablet (100 mg total) by mouth 2 (two) times daily. Patient not taking: Reported on 01/08/2019 12/26/18   Roxy Mannsower, Marne A, MD  glucose blood test strip Test blood sugar once daily and as needed for DM 250.0 11/19/14   [name], Audrie GallusMarne A, MD  Lancets [facility](ONETOUCH ULTRASOFT) lancets Test blood sugar once daily and as needed for DM 250.0     [provider]  mometasone (NASONEX) 50 MCG/ACT nasal spray PLACE 2 SPRAYS INTO THE NOSE DAILY. 01/01/19   Judy Pimpleower, Marne A, MD    Physical Exam: Vitals:   01/08/19 1315 01/08/19 1403  01/08/19 1423 01/08/19 1522  BP: 127/69 (!) 113/47 (!) 124/46 128/67  Pulse: 66 60 64 63  Resp: 20 14 14 11   Temp:  98.1 F (36.7 C) 98.1 F (36.7 C)   TempSrc:  Oral Oral   SpO2: 99% 100%  96%  Weight:      Height:         Constitutional: NAD, calm, comfortable Eyes: PERRL, lids and conjunctivae normal ENMT: Mucous membranes are dry. Neck: normal, supple, no masses, no thyromegaly Respiratory: Diminished breath sounds bilaterally, no respiratory distress, on 4 L nasal cannula O2 Cardiovascular: Regular rate and rhythm, no murmurs / rubs / gallops. No extremity edema.  Abdomen: no tenderness, no masses palpated. No hepatosplenomegaly. Bowel  sounds positive.  Musculoskeletal: no clubbing / cyanosis. No joint deformity upper and lower extremities.  Normal muscle tone.  Skin: Bilateral posterior thigh stage II with minimal sanguinous drainage Neurologic: None focal.  Speech clear Psychiatric: Normal judgment and insight. Alert and oriented x 3. Normal mood.   Labs on Admission: I have personally reviewed following labs and imaging studies  CBC: Recent Labs  Lab 01/08/19 1208  WBC 8.0  NEUTROABS 6.7  HGB 5.6*  HCT 22.9*  MCV 76.8*  PLT 339   Basic Metabolic Panel: Recent Labs  Lab 01/08/19 1208  NA 142  K 4.3  CL 103  CO2 28  GLUCOSE 103*  BUN 8  CREATININE 1.14*  CALCIUM 10.9*   GFR: Estimated Creatinine Clearance: 82.7 mL/min (A) (by C-G formula based on SCr of 1.14 mg/dL (H)). Liver Function Tests: Recent Labs  Lab 01/08/19 1208  AST 14*  ALT 10  ALKPHOS 108  BILITOT 0.4  PROT 6.9  ALBUMIN 3.2*   No results for input(s): LIPASE, AMYLASE in the last 168 hours. No results for input(s): AMMONIA in the last 168 hours. Coagulation Profile: Recent Labs  Lab 01/05/19 01/08/19 1208  INR 2.7 3.2*   Cardiac Enzymes: Recent Labs  Lab 01/08/19 1208  TROPONINI <0.03   BNP (last 3 results) No results for input(s): PROBNP in the last 8760 hours. HbA1C:  No results for input(s): HGBA1C in the last 72 hours. CBG: No results for input(s): GLUCAP in the last 168 hours. Lipid Profile: No results for input(s): CHOL, HDL, LDLCALC, TRIG, CHOLHDL, LDLDIRECT in the last 72 hours. Thyroid Function Tests: Recent Labs    01/08/19 1208 01/08/19 1402  TSH 2.667  --   FREET4  --  1.32   Anemia Panel: Recent Labs    01/08/19 1208  VITAMINB12 120*  FOLATE 10.4  FERRITIN 4*  TIBC 480*  IRON 13*  RETICCTPCT 1.9   Urine analysis:    Component Value Date/Time   COLORURINE yellow 04/21/2010 1548   APPEARANCEUR Clear 04/21/2010 1548   LABSPEC 1.020 04/21/2010 1548   PHURINE 5.0 04/21/2010 1548   GLUCOSEU NEGATIVE 09/30/2009 0854   GLUCOSEU NEGATIVE 02/21/2007 1652   HGBUR negative 04/21/2010 1548   BILIRUBINUR Negative 12/03/2017 1443   KETONESUR TRACE (A) 09/30/2009 0854   PROTEINUR 15 mg/dL 09/38/1829 9371   PROTEINUR 100 (A) 09/30/2009 0854   UROBILINOGEN 0.2 12/03/2017 1443   UROBILINOGEN 0.2 04/21/2010 1548   NITRITE Negative 12/03/2017 1443   NITRITE negative 04/21/2010 1548   LEUKOCYTESUR Negative 12/03/2017 1443   Sepsis Labs: !!!!!!!!!!!!!!!!!!!!!!!!!!!!!!!!!!!!!!!!!!!! @LABRCNTIP (procalcitonin:4,lacticidven:4) )No results found for this or any previous visit (from the past 240 hour(s)).   Radiological Exams on Admission: No results found.  EKG: Independently reviewed.  Normal sinus rhythm, rate 65  Assessment/Plan Active Problems:   Symptomatic anemia   Symptomatic anemia, with iron deficiency anemia -Transfuse 2 units pRBC -Patient has significant iron deficiency anemia, never had a colonoscopy.  FOBT is negative, ?if she has any occult blood loss.  She denies any vaginal bleeding or hematemesis.  Possibly source of bleeding could be her bilateral posterior leg wounds -GI consulted  Paroxysmal atrial fibrillation -Currently on Coumadin, hold due to supratherapeutic INR as well as symptomatic anemia -Continue  diltiazem  Chronic diastolic heart failure -Continue Lasix  Chronic hypoxemic respiratory failure -Baseline use 4L [place]   Type 2 diabetes -Hold glipizide and metformin, continue sliding scale insulin  Hyperlipidemia -Continue Zocor  Hypothyroidism -Continue Synthroid   Depression  -Continue  Prozac  OSA -CPAP nightly  Pressure ulcer, bilateral posterior thighs, stage 2, POA -Wound RN consult    DVT prophylaxis: Hold Coumadin, continue SCD Code Status: Full code Family Communication: None Disposition Plan: Pending improvement in anemia Consults called: GI Admission status: Inpatient  Severity of Illness: The appropriate patient status for this patient is INPATIENT. Inpatient status is judged to be reasonable and necessary in order to provide the required intensity of service to ensure the patient's safety. The patient's presenting symptoms, physical exam findings, and initial radiographic and laboratory data in the context of their chronic comorbidities is felt to place them at high risk for further clinical deterioration. Furthermore, it is not anticipated that the patient will be medically stable for discharge from the hospital within 2 midnights of admission. The following factors support the patient status of inpatient.   " The patient's presenting symptoms include exertional shortness of breath, fatigue, weakness. " The worrisome physical exam findings include morbid obesity. " The initial radiographic and laboratory data are worrisome because of anemia with hemoglobin 5.6 and iron deficiency. " The chronic co-morbidities include A. fib on Coumadin, chronic hypoxemic respiratory failure, chronic diastolic heart failure, diabetes, hypothyroidism.   * I certify that at the point of admission it is my clinical judgment that the patient will require inpatient hospital care spanning beyond 2 midnights from the point of admission due to high intensity of service, high risk for  further deterioration and high frequency of surveillance required.Noralee Stain, DO [facility] 01/08/2019, 4:11 PM    How to contact the [facility] Attending or Consulting provider 7A - 7P or covering provider during after hours 7P -7A, for this patient?  1. Check the care team in [facility] and look for a) attending/consulting [facility] provider listed and b) the [facility] team listed 2. Log into [email or web address] and use [place]'s universal password to access. If you do not have the password, please contact the hospital operator. 3. Locate the [facility] provider you are looking for under [facility] and page to a number that you can be directly reached. 4. If you still have difficulty reaching the provider, please page the [facility] (Director on Call) for the Hospitalists listed on amion for assistance.

## 2019-01-08 NOTE — ED Triage Notes (Signed)
Pt from home via ems; c/o sob (chronic issue, pt states worse over last 9 months); EMS also reports low HGB (4 or 5); was notified by PCP yesterday; hx COPD and CHF; SOB increases with exertion; sats 88% upon standing on [place] 4L which pt wears all the time; pt on warfarin for afib; increased leg swelling; denies sick contacts  136/72 HR 88  RR 22 100% 10L NRB CBG 118 T 98.28F

## 2019-01-08 NOTE — Progress Notes (Signed)
Patient became very anxious after RT placed patient on cpap. Patient stated she felt like she was going to have a panic attack. She is now back on the nasal cannula, xanax given to patient and patient is resting. Will continue to monitor.

## 2019-01-08 NOTE — ED Provider Notes (Signed)
[facility] EMERGENCY DEPARTMENT Provider Note   CSN: [id number] Arrival date & time: 01/08/19  1146    History   Chief Complaint Chief Complaint  Patient presents with  . Shortness of Breath    HPI EVERLINE FLOW is a 63 y.o. female with history of atrial fibrillation on Coumadin, OSA, obesity hypoventilation syndrome, chronic respiratory failure with chronic use of 4 L [place], diabetes, GERD, morbid obesity, hypothyroidism is here for evaluation of shortness of breath associated with generalized fatigue, weakness, lightheadedness, chest pressure.  Her shortness of breath is accompanied by chest pressure and is exertional.  SOB despite using 4 L [place].  Baseline SpO2 at home is around 90%. States this has been going on for years but recently worsened in the last week.  States 2 days ago she had blood drawn by PCP and was called regarding low hemoglobin around 4-5.  Denies previous history of anemia.  Denies previous history of blood transfusions.  Reports every now and then she notices some blood in her urine but none in several weeks.  Denies any hematemesis, melena, hematochezia.  She has chronic skin breakdown and wounds to her posterior bilateral legs that ooze blood but denies any recent increase in bleeding from these wounds.  No falls or head trauma.  She spends majority of her day on her chair/bed.  Recent URI finished doxycyline.    Denies associated headache, cough, abdominal pain, diarrhea, dysuria.     HPI  Past Medical History:  Diagnosis Date  . Atrial fibrillation (HCC)    with cardioversion success  . CHF (congestive heart failure) (HCC)   . COPD (chronic obstructive pulmonary disease) (HCC)   . Cor pulmonale (HCC)    and obesity hypoventilation syndrome  . Diabetes mellitus without complication (HCC)   . GERD (gastroesophageal reflux disease)   . Hypothyroid   . Morbid obesity (HCC)   . OA (osteoarthritis)   . Pulmonary embolism (HCC)    hx of on  coumadin  . Reactive airways dysfunction syndrome [facility])     Patient Active Problem List   Diagnosis Date Noted  . URI (upper respiratory infection) 01/01/2019  . Reactive depression (situational) 12/29/2018  . Mobility impaired 12/12/2018  . Hematuria 12/03/2017  . Pedal edema 12/23/2016  . OSA (obstructive sleep apnea) 12/21/2016  . Financial difficulties 09/16/2016  . Colon cancer screening 12/15/2013  . Encounter for therapeutic drug monitoring 11/05/2013  . Routine general medical examination at a health care facility 05/20/2012  . Atrial fibrillation (HCC) 11/23/2010  . History of pulmonary embolism 11/23/2010  . Vitamin D deficiency 11/15/2010  . DYSURIA, HX OF 08/19/2009  . PURE HYPERCHOLESTEROLEMIA 08/16/2009  . ATRIAL FIBRILLATION, HX OF 12/03/2008  . Diabetes type 2, controlled (HCC) 08/13/2008  . Hyperparathyroidism (HCC) 07/30/2008  . Hypothyroidism 02/13/2008  . Morbid obesity (HCC) 02/04/2008  . Reactive airway disease 02/04/2008  . GERD 02/04/2008  . DISC DISEASE, LUMBAR 02/04/2008  . PULMONARY EMBOLISM, HX OF 02/04/2008  . TOBACCO USE, QUIT 02/04/2008  . Obesity hypoventilation syndrome (HCC) 08/11/2007  . COR PULMONALE 08/11/2007    Past Surgical History:  Procedure Laterality Date  . TONSILLECTOMY       OB History   No obstetric history on file.      Home Medications    Prior to Admission medications   Medication Sig Start Date End Date Taking? Authorizing Provider  albuterol (PROVENTIL) (2.5 MG/3ML) 0.083% nebulizer solution USE 1 VIAL IN NEBULIZER EVERY 4 HOURS AS  NEEDED FOR  WHEEZING 10/03/18   [name], [name], MD  budesonide-formoterol [facility]) 160-4.5 MCG/ACT inhaler INHALE 2 PUFFS INTO LUNGS TWICE A DAY 01/02/19   [name], [name], MD  cholecalciferol (VITAMIN D) 1000 UNITS tablet Take 4,000 Units by mouth daily.    [provider]  ciclopirox (LOPROX) 0.77 % cream APPLY TO AFFECTED AREA TWICE A DAY AS NEEDED 10/16/18   [name], [name],  MD  diclofenac (VOLTAREN) 75 MG EC tablet TAKE ONE TABLET BY MOUTH TWICE DAILY AS NEEDED FOR  PAIN  WITH  FOOD. 12/19/18   [name], [name], MD  diltiazem (CARTIA XT) 120 MG 24 hr capsule Take 1 capsule (120 mg total) by mouth daily. 12/03/17   [name], [name], MD  doxycycline (VIBRA-TABS) 100 MG tablet Take 1 tablet (100 mg total) by mouth 2 (two) times daily. 12/26/18   [name], [name], MD  FLUoxetine (PROZAC) 20 MG tablet Take 1 tablet (20 mg total) by mouth daily. 12/29/18   [name], [name], MD  furosemide (LASIX) 80 MG tablet Take 1 tablet (80 mg total) by mouth 2 (two) times daily. 12/19/18   [name], [name], MD  glipiZIDE (GLUCOTROL XL) 5 MG 24 hr tablet TAKE 1 TABLET BY MOUTH EVERY DAY 12/22/18   [name], [place] A, MD  glucose blood test strip Test blood sugar once daily and as needed for DM 250.0 11/19/14   [name], [place] A, MD  Lancets [facility] ULTRASOFT) lancets Test blood sugar once daily and as needed for DM 250.0     [provider]  levothyroxine (SYNTHROID, LEVOTHROID) 175 MCG tablet TAKE 1 TABLET BY MOUTH EVERY DAY BEFORE BREAKFAST 12/22/18   [name], [name], MD  loratadine (CLARITIN) 10 MG tablet OTC as directed.     [provider]  metFORMIN (GLUCOPHAGE) 1000 MG tablet TAKE 1 TABLET BY MOUTH TWICE DAILY WITH A MEAL 12/19/18   [name], [place] A, MD  mometasone (ELOCON) 0.1 % cream Tiny amount to each ear canal twice weekly as needed.    [provider]  mometasone (NASONEX) 50 MCG/ACT nasal spray PLACE 2 SPRAYS INTO THE NOSE DAILY. 01/01/19   [name], [name], MD  mupirocin ointment (BACTROBAN) 2 % APPLY TO AFFECTED AREA TWICE A DAY 01/01/19   [name], [name], MD  omega-3 acid ethyl esters (LOVAZA) 1 G capsule Take by mouth 2 (two) times daily.    [provider]  Omega-3 Fatty Acids (FISH OIL PO) Take 1 tablet by mouth daily.    [provider]  potassium chloride SA (KLOR-CON M20) 20 MEQ tablet TAKE 2 TABLETS BY MOUTH EVERY DAY 01/02/19   [name], [name], MD   simvastatin (ZOCOR) 20 MG tablet Take 1 tablet (20 mg total) by mouth at bedtime. 12/26/18   [name], [name], MD  traMADol (ULTRAM) 50 MG tablet Take 1 tablet (50 mg total) by mouth every 12 (twelve) hours as needed for moderate pain or severe pain. Caution of sedation/falls and constipation 12/23/18   [name], [name], MD  warfarin (COUMADIN) 5 MG tablet TAKE AS DIRECTED BY THE ANTICOAGULATION CLINIC. 11/18/18   [name], [name], MD    Family History Family History  Problem Relation Age of Onset  . Heart disease Father 70       MI  . Diabetes Brother     Social History Social History   Tobacco Use  . Smoking status: Former Games developer  . Smokeless tobacco: Former Engineer, water Use Topics  .  Alcohol use: No    Alcohol/week: 0.0 standard drinks  . Drug use: No     Allergies   Augmentin [amoxicillin-pot clavulanate] and Pseudoephedrine   Review of Systems Review of Systems  Constitutional: Positive for fatigue.  Respiratory: Positive for shortness of breath.   Cardiovascular: Positive for chest pain.  Genitourinary: Positive for hematuria.  Skin: Positive for wound.  Neurological: Positive for weakness.  Hematological: Bruises/bleeds easily.  All other systems reviewed and are negative.    Physical Exam Updated Vital Signs BP (!) 124/46   Pulse 64   Temp 98.1 F (36.7 C) (Oral)   Resp 14   Ht  (1.651 m)   Wt (!) 173.7 kg   SpO2 100%   BMI 63.73 kg/m   Physical Exam Vitals signs and nursing note reviewed.  Constitutional:      Appearance: She is well-developed.     Comments: Morbidly obese.  Disheveled.  HENT:     Head: Normocephalic and atraumatic.     Nose: Nose normal.  Eyes:     Conjunctiva/sclera: Conjunctivae normal.  Neck:     Musculoskeletal: Normal range of motion.  Cardiovascular:     Rate and Rhythm: Normal rate and regular rhythm.  Pulmonary:     Effort: Pulmonary effort is normal.     Breath sounds: Normal breath sounds.     Comments:  Lungs clear to upper/mid lobes.  Diminished lung sounds to lower lobes.  Difficult exam due to body habitus.  SPO2 greater than 90% on 4 L [place], transiently drops to 75% with head of the bed flat/repositioning and rectal exam. Abdominal:     General: Bowel sounds are normal.     Palpations: Abdomen is soft.     Tenderness: There is no abdominal tenderness.     Comments: No G/R/R. No suprapubic or CVA tenderness.   Genitourinary:    Comments: DRE performed with EMTs and RN.  Stool is light brown.  No melena.  Normal rectal tone. Musculoskeletal: Normal range of motion.  Skin:    General: Skin is warm and dry.     Capillary Refill: Capillary refill takes less than 2 seconds.     Comments: Multiple areas of superficial skin breakdown, erythema to the medial, proximal posterior legs and buttocks.  No significant bleeding.  No odor.  No drainage.  Neurological:     Mental Status: She is alert.  Psychiatric:        Behavior: Behavior normal.      ED Treatments / Results  Labs (all labs ordered are listed, but only abnormal results are displayed) Labs Reviewed  VITAMIN B12 - Abnormal; Notable for the following components:      Result Value   Vitamin B-12 120 (*)    All other components within normal limits  IRON AND TIBC - Abnormal; Notable for the following components:   Iron 13 (*)    TIBC 480 (*)    Saturation Ratios 3 (*)    All other components within normal limits  FERRITIN - Abnormal; Notable for the following components:   Ferritin 4 (*)    All other components within normal limits  RETICULOCYTES - Abnormal; Notable for the following components:   RBC. 2.98 (*)    Immature Retic Fract 23.9 (*)    All other components within normal limits  CBC WITH DIFFERENTIAL/PLATELET - Abnormal; Notable for the following components:   RBC 2.98 (*)    Hemoglobin 5.6 (*)    HCT 22.9 (*)  MCV 76.8 (*)    MCH 18.8 (*)    MCHC 24.5 (*)    RDW 20.8 (*)    Lymphs Abs 0.6 (*)    All other  components within normal limits  COMPREHENSIVE METABOLIC PANEL - Abnormal; Notable for the following components:   Glucose, Bld 103 (*)    Creatinine, Ser 1.14 (*)    Calcium 10.9 (*)    Albumin 3.2 (*)    AST 14 (*)    GFR calc non Af Amer 51 (*)    GFR calc Af Amer 59 (*)    All other components within normal limits  PROTIME-INR - Abnormal; Notable for the following components:   Prothrombin Time 32.0 (*)    INR 3.2 (*)    All other components within normal limits  APTT - Abnormal; Notable for the following components:   aPTT 42 (*)    All other components within normal limits  FOLATE  TROPONIN I  URINALYSIS, ROUTINE W REFLEX MICROSCOPIC  TSH  T4, FREE  POC OCCULT BLOOD, ED  POC OCCULT BLOOD, ED  TYPE AND SCREEN  PREPARE RBC (CROSSMATCH)  ABO/RH    EKG EKG Interpretation  Date/Time:  Thursday January 08 2019 11:58:21 EDT Ventricular Rate:  65 PR Interval:    QRS Duration: 83 QT Interval:  408 QTC Calculation: 425 R Axis:   38 Text Interpretation:  Sinus rhythm Low voltage, precordial leads sinus replacing afib prior 1/11 Confirmed by Meridee Score [phone]) on 01/08/2019 12:02:03 PM Also confirmed by Meridee Score [phone]), editor Barbette Hair [phone])  on 01/08/2019 12:39:28 PM   Radiology No results found.  Procedures .Critical Care Performed by: Liberty Handy, PA-C Authorized by: Liberty Handy, PA-C   Critical care provider statement:    Critical care time (minutes):  45   Critical care was necessary to treat or prevent imminent or life-threatening deterioration of the following conditions: symptomatic anemia.   Critical care was time spent personally by me on the following activities:  Discussions with consultants, evaluation of patient's response to treatment, examination of patient, ordering and performing treatments and interventions, ordering and review of laboratory studies, ordering and review of radiographic studies, pulse oximetry, re-evaluation  of patient's condition, obtaining history from patient or surrogate and review of old charts   I assumed direction of critical care for this patient from another provider in my specialty: no     (including critical care time)  Medications Ordered in ED Medications  0.9 %  sodium chloride infusion (has no administration in time range)     Initial Impression / Assessment and Plan / ED Course  I have reviewed the triage vital signs and the nursing notes.  Pertinent labs & imaging results that were available during my care of the patient were reviewed by me and considered in my medical decision making (see chart for details).  Clinical Course as of Jan 07 1442  Thu Jan 08, 2019  1354 Hemoglobin(!!): 5.6 [CG]  1354 HCT(!): 22.9 [CG]  1354 Iron(!): 13 [CG]  1354 TIBC(!): 480 [CG]  3323 63 year old female with multiple medical problems here with increased shortness of breath and weakness.  Found to be anemic here.  She will need to come in for transfusion and further work-up of her anemia.  She is anticoagulated on Coumadin for A. fib.   [MB]    Clinical Course User Index [CG] Liberty Handy, PA-C [MB] Terrilee Files, MD       Shortness  of breath likely from symptomatic anemia.  Morbid obesity, hypoventilation syndrome likely also contributing.  ACS considered less likely given recently low hemoglobin but she has multiple risk factors so also a possibility.  No fever, cough and recently finished doxycycline making pneumonia, viral URI less likely.  Patient is basically homebound and no exposures to sick contacts/COVID.  Hemoccult with light brown stool, negative.  HD stable.   Will obtain labs, type and screen, EKG, troponin to evaluate for demand ischemia.  She denies obvious sources of hemorrhage but has had intermittent hematuria several weeks ago.  1407: Hemoglobin 5.6.  Undetectable troponin.  EKG without atrial fibrillation, ST elevations, T wave inversions.  Labs otherwise  unremarkable.  Plan to transfuse 2 units here in the ED.  Consult hospitalist for symptomatic anemia requiring transfusion in setting of chronic anticoagulation. No h/o GIB, PUD, NSAID use, ETOH use.  Final Clinical Impressions(s) / ED Diagnoses   1441: Spoke to Jennye MoccasinSarah [facility], aware of patient, no recommendations for now.  Final diagnoses:  Symptomatic anemia    ED Discharge Orders    None       Jerrell MylarGibbons, Claudia J, PA-C 01/08/19 1443    Terrilee FilesButler, Michael C, MD 01/08/19 [phone]

## 2019-01-08 NOTE — Progress Notes (Signed)
ANTICOAGULATION CONSULT NOTE - Initial Consult  Pharmacy Consult for warfarin Indication: atrial fibrillation and pulmonary embolus  Allergies  Allergen Reactions  . Pseudoephedrine Other (See Comments)    REACTION: tightness in chest  . Augmentin [Amoxicillin-Pot Clavulanate] Itching and Rash    Did it involve swelling of the face/tongue/throat, SOB, or low BP? No Did it involve sudden or severe rash/hives, skin peeling, or any reaction on the inside of your mouth or nose? Yes Did you need to seek medical attention at a hospital or doctor's office? Yes When did it last happen?2019 If all above answers are "NO", may proceed with cephalosporin use.   . Latex Itching and Rash    Patient Measurements: Height: 5\' 5"  (165.1 cm) Weight: (!) 383 lb (173.7 kg) IBW/kg (Calculated) : 57  Vital Signs: Temp: 98.1 F (36.7 C) (04/09 1423) Temp Source: Oral (04/09 1423) BP: 128/67 (04/09 1522) Pulse Rate: 63 (04/09 1522)  Labs: Recent Labs    01/08/19 1208  HGB 5.6*  HCT 22.9*  PLT 339  APTT 42*  LABPROT 32.0*  INR 3.2*  CREATININE 1.14*  TROPONINI <0.03    Estimated Creatinine Clearance: 82.7 mL/min (A) (by C-G formula based on SCr of 1.14 mg/dL (H)).   Medical History: Past Medical History:  Diagnosis Date  . Atrial fibrillation (HCC)    with cardioversion success  . CHF (congestive heart failure) (HCC)   . COPD (chronic obstructive pulmonary disease) (HCC)   . Cor pulmonale (HCC)    and obesity hypoventilation syndrome  . Diabetes mellitus without complication (HCC)   . GERD (gastroesophageal reflux disease)   . Hypothyroid   . Morbid obesity (HCC)   . OA (osteoarthritis)   . Pulmonary embolism (HCC)    hx of on coumadin  . Reactive airways dysfunction syndrome (HCC)     Assessment: 45 yof presented to the ED with SOB. She is on warfarin for history of afib and PE. Found to be anemia with Hgb of 5.6. INR is also supratherapeutic at 3.2.   Goal of  Therapy:  INR 2-3 Monitor platelets by anticoagulation protocol: Yes   Plan:  No warfarin today - will follow-up with MD about timing to restart Daily INR  Terin Dierolf, Drake Leach 01/08/2019,3:47 PM

## 2019-01-08 NOTE — Telephone Encounter (Signed)
Called Millie, pt's social worker and advised her of pt's hemoglobin and she will also encourage pt to go to the hospital too

## 2019-01-08 NOTE — Telephone Encounter (Signed)
Per Sharyl Nimrod, patient has not went to ED for low hemoglobin.

## 2019-01-08 NOTE — Telephone Encounter (Signed)
Outbound call to patient. Encouraged patient to seek treatment at ED due to low hemoglobin. Patient stated she was going to contact EMS for transportation and go to [facility] ED in the very near future.

## 2019-01-08 NOTE — Telephone Encounter (Signed)
Spoke with patients husband as she was being prepped to go by EMS.  Due to recent panic labs, I re-enforced imperativeness for patient to go immediately to the hospital and to STOP coumadin until further workup and notice from the hospital.  Husband verbalizes understanding and states they are calling EMS now.

## 2019-01-08 NOTE — Consult Note (Signed)
[facility] Consult: 2:45 PM 01/08/2019  LOS: 0 days    Referring Provider: Dr Charm BargesButler   Primary Care Physician:  [name], Audrie GallusMarne A, MD see the doctor often.   Primary Gastroenterologist:  Gentry FitzUnassigned, none.       Reason for Consultation:  FOBT negative anemia.  Iron deficient   HPI: Amy Bryant is a 63 y.o. female.  PMH Morbid obesity, cor pulmonale, OSA.  Diabetes.  CHF, last echo 2010 EF 55%, no significant valve issues..  A. fib, s/p cardioversion.  Chronic warfarin. DM 2 on oral agents.  Mobility impaired due to obesity and respiratory limitations. No previous colonoscopy, EGDs. Admitted with symptomatic anemia.  Having progressive dyspnea over the last several months.  She can hardly push herself around in her wheelchair.  She has chronic wounds on the back of her thighs bilaterally.  These continuously lose blood for at least a year.  No black or tarry stools, bleeding per rectum.  For a few months having heartburn which is managed prn omeprazole but she has to use it about every day.  No dysphagia.  Chronic Voltaran, refuses to quit using it despite its risk of bleeding.  Aloe medicine visit with Dr. Milinda Antisower on 01/01/2019 Routine lab work collected by home health nurse 2 days ago revealed anemia.  Yesterday they called her at home and advised her to go to the ED so she came in today. Hgb 5.6, MCV 76.8.  It was 11.3 on 12/03/2017.  INR 3.2. Ferritin 4, iron 13. Has a point-of-care INR meter at home.  Recent measurements of 2.4 and 2.3.  No fm hx colon cancer, anemia, GIB, ulcers.   Doesn't drink ETOH or smoke.     Past Medical History:  Diagnosis Date  . Atrial fibrillation (HCC)    with cardioversion success  . CHF (congestive heart failure) (HCC)   . COPD (chronic obstructive pulmonary disease) (HCC)   .  Cor pulmonale (HCC)    and obesity hypoventilation syndrome  . Diabetes mellitus without complication (HCC)   . GERD (gastroesophageal reflux disease)   . Hypothyroid   . Morbid obesity (HCC)   . OA (osteoarthritis)   . Pulmonary embolism (HCC)    hx of on coumadin  . Reactive airways dysfunction syndrome [facility](HCC)     Past Surgical History:  Procedure Laterality Date  . TONSILLECTOMY      Prior to Admission medications   Medication Sig Start Date End Date Taking? Authorizing Provider  albuterol (PROVENTIL) (2.5 MG/3ML) 0.083% nebulizer solution USE 1 VIAL IN NEBULIZER EVERY 4 HOURS AS NEEDED FOR  WHEEZING 10/03/18   [name], Audrie GallusMarne A, MD  budesonide-formoterol (SYMBICORT) 160-4.5 MCG/ACT inhaler INHALE 2 PUFFS INTO LUNGS TWICE A DAY 01/02/19   [name], Audrie GallusMarne A, MD  cholecalciferol (VITAMIN D) 1000 UNITS tablet Take 4,000 Units by mouth daily.    [provider]  ciclopirox (LOPROX) 0.77 % cream APPLY TO AFFECTED AREA TWICE A DAY AS NEEDED 10/16/18   [name], Audrie GallusMarne A, MD  diclofenac (VOLTAREN) 75 MG EC tablet TAKE ONE TABLET  BY MOUTH TWICE DAILY AS NEEDED FOR  PAIN  WITH  FOOD. 12/19/18   [name], [name], MD  diltiazem (CARTIA XT) 120 MG 24 hr capsule Take 1 capsule (120 mg total) by mouth daily. 12/03/17   [name], [name], MD  doxycycline (VIBRA-TABS) 100 MG tablet Take 1 tablet (100 mg total) by mouth 2 (two) times daily. 12/26/18   [name], [name], MD  FLUoxetine (PROZAC) 20 MG tablet Take 1 tablet (20 mg total) by mouth daily. 12/29/18   [name], [name], MD  furosemide (LASIX) 80 MG tablet Take 1 tablet (80 mg total) by mouth 2 (two) times daily. 12/19/18   [name], [name], MD  glipiZIDE (GLUCOTROL XL) 5 MG 24 hr tablet TAKE 1 TABLET BY MOUTH EVERY DAY 12/22/18   [name], [place] A, MD  glucose blood test strip Test blood sugar once daily and as needed for DM 250.0 11/19/14   [name], [place] A, MD  Lancets [facility] ULTRASOFT) lancets Test blood sugar once daily and as needed for DM 250.0     [provider]  levothyroxine (SYNTHROID, LEVOTHROID) 175 MCG tablet TAKE 1 TABLET BY MOUTH EVERY DAY BEFORE BREAKFAST 12/22/18   [name], [name], MD  loratadine (CLARITIN) 10 MG tablet OTC as directed.     [provider]  metFORMIN (GLUCOPHAGE) 1000 MG tablet TAKE 1 TABLET BY MOUTH TWICE DAILY WITH A MEAL 12/19/18   [name], [place] A, MD  mometasone (ELOCON) 0.1 % cream Tiny amount to each ear canal twice weekly as needed.    [provider]  mometasone (NASONEX) 50 MCG/ACT nasal spray PLACE 2 SPRAYS INTO THE NOSE DAILY. 01/01/19   [name], [name], MD  mupirocin ointment (BACTROBAN) 2 % APPLY TO AFFECTED AREA TWICE A DAY 01/01/19   [name], [name], MD  omega-3 acid ethyl esters (LOVAZA) 1 G capsule Take by mouth 2 (two) times daily.    [provider]  Omega-3 Fatty Acids (FISH OIL PO) Take 1 tablet by mouth daily.    [provider]  potassium chloride SA (KLOR-CON M20) 20 MEQ tablet TAKE 2 TABLETS BY MOUTH EVERY DAY 01/02/19   [name], [name], MD  simvastatin (ZOCOR) 20 MG tablet Take 1 tablet (20 mg total) by mouth at bedtime. 12/26/18   [name], [name], MD  traMADol (ULTRAM) 50 MG tablet Take 1 tablet (50 mg total) by mouth every 12 (twelve) hours as needed for moderate pain or severe pain. Caution of sedation/falls and constipation 12/23/18   [name], Idamae Schuller A, MD  warfarin (COUMADIN) 5 MG tablet TAKE AS DIRECTED BY THE ANTICOAGULATION CLINIC. 11/18/18   [name], [name], MD    Scheduled Meds:  Infusions: . sodium chloride     PRN Meds:    Allergies as of 01/08/2019 - Review Complete 01/08/2019  Allergen Reaction Noted  . Augmentin [amoxicillin-pot clavulanate] Other (See Comments) 02/08/2014  . Pseudoephedrine  02/04/2008    Family History  Problem Relation Age of Onset  . Heart disease Father 36       MI  . Diabetes Brother     Social History   Socioeconomic History  . Marital status: Married    Spouse name: Not on file  . Number of children: Not on file   . Years of education: Not on file  . Highest education level: Not on file  Occupational History  . Not on file  Social Needs  . Financial resource strain: Not on file  . Food  insecurity:    Worry: Not on file    Inability: Not on file  . Transportation needs:    Medical: Not on file    Non-medical: Not on file  Tobacco Use  . Smoking status: Former Games developer  . Smokeless tobacco: Former Engineer, water and Sexual Activity  . Alcohol use: No    Alcohol/week: 0.0 standard drinks  . Drug use: No  . Sexual activity: Not on file  Lifestyle  . Physical activity:    Days per week: Not on file    Minutes per session: Not on file  . Stress: Not on file  Relationships  . Social connections:    Talks on phone: Not on file    Gets together: Not on file    Attends religious service: Not on file    Active member of club or organization: Not on file    Attends meetings of clubs or organizations: Not on file    Relationship status: Not on file  . Intimate partner violence:    Fear of current or ex partner: Not on file    Emotionally abused: Not on file    Physically abused: Not on file    Forced sexual activity: Not on file  Other Topics Concern  . Not on file  Social History Narrative  . Not on file    REVIEW OF SYSTEMS: Constitutional: Per HPI.  Very weak and fatigued. ENT:  No nose bleeds Pulm: HPI.  Significant DOE/SOB. CV:  No palpitations, no LE edema.  GU:  No hematuria, no frequency GI:  See HPI.   Heme:  No previous history of iron deficiency or anemia. Transfusions: Prior history of red blood cell transfusions Neuro:  No headaches, no peripheral tingling or numbness.  Syncope. Derm: Neck thigh wounds as per HPI. Endocrine:  No sweats or chills.  No polyuria or dysuria Immunization: Reviewed vaccination history.  She has not had a flu shot for this year.  She has not been able to get up out of the house to go get 1. Travel: None   PHYSICAL EXAM: Vital signs in  last 24 hours: Vitals:   01/08/19 1403 01/08/19 1423  BP: (!) 113/47 (!) 124/46  Pulse: 60 64  Resp: 14 14  Temp: 98.1 F (36.7 C) 98.1 F (36.7 C)  SpO2: 100%    Wt Readings from Last 3 Encounters:  01/08/19 (!) 173.7 kg  11/15/10 (!) 200.5 kg    General: Morbidly obese, unwell appearing, alert, comfortable WF. Head: No facial asymmetry or swelling.  No signs of head trauma. Eyes: No scleral icterus.  Conjunctiva pale.  EOMI. Ears: Not hard of hearing Nose: Discharge, no congestion.  Oval ulceration at bridge of nose, she says it is from her CPAP mask. Mouth: Pharynx moist, pink, clear.  Tongue midline. Neck: Obese, no JVD, no masses. Lungs: Diminished breath sounds bilaterally.  No labored breathing at rest.  No cough. Heart: RRR.  No MRG.  S1, S2 present. Abdomen: Obese, large.  Nontender.  No HSM, bruits, masses, hernias appreciated..   Rectal: Not performed DRE.  Stool submitted to the lab is FOBT negative.  Musc/Skeltl: No joint deformities, warmth or redness. Extremities: Less edema in both lower legs. Neurologic: Oriented x3.  No tremors.  No gross weakness or deficits. Skin: Bullous edema in the lower legs.  Skin breakdown at posterior mid to upper thighs, there is obvious blood in small quantities in this area.  Pressure dressings cover some of this  and pressure dressings on the buttocks as well.  These were not removed for examination. Tattoos: None. Nodes: No cervical adenopathy. Psych: Cooperative, pleasant, somewhat depressed.  Intake/Output from previous day: No intake/output data recorded. Intake/Output this shift: Total I/O In: 315 [Blood:315] Out: -   LAB RESULTS: Recent Labs    01/08/19 1208  WBC 8.0  HGB 5.6*  HCT 22.9*  PLT 339   BMET Lab Results  Component Value Date   NA 142 01/08/2019   NA 140 12/03/2017   NA 140 07/06/2015   K 4.3 01/08/2019   K 4.5 12/03/2017   K 4.3 07/06/2015   CL 103 01/08/2019   CL 101 12/03/2017   CL 103  07/06/2015   CO2 28 01/08/2019   CO2 36 (H) 12/03/2017   CO2 32 07/06/2015   GLUCOSE 103 (H) 01/08/2019   GLUCOSE 95 12/03/2017   GLUCOSE 174 (H) 07/06/2015   BUN 8 01/08/2019   BUN 26 (H) 12/03/2017   BUN 20 07/06/2015   CREATININE 1.14 (H) 01/08/2019   CREATININE 0.88 12/03/2017   CREATININE 0.77 07/06/2015   CALCIUM 10.9 (H) 01/08/2019   CALCIUM 12.2 (H) 12/03/2017   CALCIUM 11.5 (H) 07/06/2015   LFT Recent Labs    01/08/19 1208  PROT 6.9  ALBUMIN 3.2*  AST 14*  ALT 10  ALKPHOS 108  BILITOT 0.4   PT/INR Lab Results  Component Value Date   INR 3.2 (H) 01/08/2019   INR 2.7 01/05/2019   INR 2.8 12/29/2018     RADIOLOGY STUDIES: No results found.   IMPRESSION:   *   Iron deficiency anemia. FOBT negative. 2 U PRBCs ordered Takes NSAIDs at home  *   Heartburn, controlled with Omeprazole.    *   Morbid obesity.  OSA.  CHF.  Pulmonary hypertension.  COPD.  *    Chronic posterior skin breakdown of the thighs.  Constant oozing of blood from this area.  Hard to believe that this could lead to her current severe anemia    PLAN:     *   Would obtain echo since 10 yrs since last done.  Check EF  *   Transfuse as ordered  *    Protonix 40 mg po Q day.  CBC in AM  *  Consider Colonoscopy and EGD no sooner than 4/11   Jennye Moccasin  01/08/2019, 2:45 PM Phone [phone]

## 2019-01-08 NOTE — Telephone Encounter (Signed)
Aware, thanks!

## 2019-01-08 NOTE — ED Notes (Signed)
Dr. Charm Barges notified of low hgb

## 2019-01-08 NOTE — Progress Notes (Signed)
Pt unable to tolerate CPAP. Pt taken off and placed back on 4L [place] by RN. RT will continue to monitor.

## 2019-01-09 ENCOUNTER — Inpatient Hospital Stay (HOSPITAL_COMMUNITY): Payer: Medicare HMO

## 2019-01-09 DIAGNOSIS — G4733 Obstructive sleep apnea (adult) (pediatric): Secondary | ICD-10-CM

## 2019-01-09 DIAGNOSIS — D508 Other iron deficiency anemias: Secondary | ICD-10-CM

## 2019-01-09 DIAGNOSIS — D689 Coagulation defect, unspecified: Secondary | ICD-10-CM | POA: Diagnosis present

## 2019-01-09 DIAGNOSIS — I340 Nonrheumatic mitral (valve) insufficiency: Secondary | ICD-10-CM

## 2019-01-09 DIAGNOSIS — I4891 Unspecified atrial fibrillation: Secondary | ICD-10-CM

## 2019-01-09 DIAGNOSIS — Z7901 Long term (current) use of anticoagulants: Secondary | ICD-10-CM

## 2019-01-09 DIAGNOSIS — E119 Type 2 diabetes mellitus without complications: Secondary | ICD-10-CM

## 2019-01-09 LAB — BASIC METABOLIC PANEL
Anion gap: 8 (ref 5–15)
BUN: 8 mg/dL (ref 8–23)
CO2: 32 mmol/L (ref 22–32)
Calcium: 10.9 mg/dL — ABNORMAL HIGH (ref 8.9–10.3)
Chloride: 100 mmol/L (ref 98–111)
Creatinine, Ser: 1.06 mg/dL — ABNORMAL HIGH (ref 0.44–1.00)
GFR calc Af Amer: >60 mL/min (ref >60)
GFR calc non Af Amer: 56 mL/min — ABNORMAL LOW (ref >60)
Glucose, Bld: 99 mg/dL (ref 70–99)
Potassium: 4.1 mmol/L (ref 3.5–5.1)
Sodium: 140 mmol/L (ref 135–145)

## 2019-01-09 LAB — URINALYSIS, ROUTINE W REFLEX MICROSCOPIC
Bacteria, UA: NONE SEEN
Bilirubin Urine: NEGATIVE
Glucose, UA: NEGATIVE mg/dL
Ketones, ur: NEGATIVE mg/dL
Leukocytes,Ua: NEGATIVE
Nitrite: NEGATIVE
Protein, ur: NEGATIVE mg/dL
Specific Gravity, Urine: 1.01 (ref 1.005–1.030)
pH: 7 (ref 5.0–8.0)

## 2019-01-09 LAB — HIV ANTIBODY (ROUTINE TESTING W REFLEX): HIV Screen 4th Generation wRfx: NONREACTIVE

## 2019-01-09 LAB — CBC
HCT: 27.4 % — ABNORMAL LOW (ref 36.0–46.0)
Hemoglobin: 7.5 g/dL — ABNORMAL LOW (ref 12.0–15.0)
MCH: 20.9 pg — ABNORMAL LOW (ref 26.0–34.0)
MCHC: 27.4 g/dL — ABNORMAL LOW (ref 30.0–36.0)
MCV: 76.5 fL — ABNORMAL LOW (ref 80.0–100.0)
Platelets: 339 10*3/uL (ref 150–400)
RBC: 3.58 MIL/uL — ABNORMAL LOW (ref 3.87–5.11)
RDW: 18.9 % — ABNORMAL HIGH (ref 11.5–15.5)
WBC: 6.5 10*3/uL (ref 4.0–10.5)
nRBC: 0.3 % — ABNORMAL HIGH (ref 0.0–0.2)

## 2019-01-09 LAB — PROTIME-INR
INR: 2.5 — ABNORMAL HIGH (ref 0.8–1.2)
Prothrombin Time: 26.3 seconds — ABNORMAL HIGH (ref 11.4–15.2)

## 2019-01-09 LAB — GLUCOSE, CAPILLARY
Glucose-Capillary: 91 mg/dL (ref 70–99)
Glucose-Capillary: 118 mg/dL — ABNORMAL HIGH (ref 70–99)
Glucose-Capillary: 109 mg/dL — ABNORMAL HIGH (ref 70–99)
Glucose-Capillary: 129 mg/dL — ABNORMAL HIGH (ref 70–99)

## 2019-01-09 LAB — ECHOCARDIOGRAM LIMITED
Height: 64 in
Weight: 5756.65 oz

## 2019-01-09 LAB — PREPARE RBC (CROSSMATCH)

## 2019-01-09 MED ORDER — SODIUM CHLORIDE 0.9% IV SOLUTION
Freq: Once | INTRAVENOUS | Status: AC
  Administered 2019-01-09: 01:00:00 via INTRAVENOUS

## 2019-01-09 MED ORDER — ALBUTEROL SULFATE (2.5 MG/3ML) 0.083% IN NEBU
2.5000 mg | INHALATION_SOLUTION | Freq: Three times a day (TID) | RESPIRATORY_TRACT | Status: DC
  Administered 2019-01-09 – 2019-01-14 (×15): 2.5 mg via RESPIRATORY_TRACT
  Filled 2019-01-09 (×15): qty 3

## 2019-01-09 NOTE — Consult Note (Addendum)
WOC Nurse wound consult note Reason for Consult: Consult requested for bilat posterior thighs.   Wound type: These are NOT pressure injuries; pt admits she spends prolonged periods of time in a recliner prior to admission and has to scoot to the edge to get up.   Measurement: Approx 6X6X.1cm to bilat affected areas of partial thickness skin loss; shaggy, irregular edges slightly raised above skin level, red and moist, small amt yellow drainage, painful when sticking to the sheets.  Pt applies barrier cream and a nonadherent dressing when at home and states she has a latex allergy. Appearance is consistent with a condition referred to as "chronic tissue damage" which is related to spending prolonged periods of time in a recliner and results in patchy areas of partial thickness skin loss and shear injuries.  Dressing procedure/placement/frequency: Foam dressings to protect from further injury and prevent patient from "sticking to sheets." Checked manufacturer's website and Allevyn foam dressings are safe to use with a latex allergy.  Please re-consult if further assistance is needed.  Thank-you,  Cammie Mcgee MSN, RN, CWOCN, [place], CNS [phone]

## 2019-01-09 NOTE — Progress Notes (Signed)
Patient ID: Amy Bryant Gurr, female   DOB: 1955-12-19, 63 y.o.   MRN: [id number]  PROGRESS NOTE    Amy Bryant Spaugh  KGM:[id number] DOB: 1955-12-19 DOA: 01/08/2019 PCP: Judy Pimpleower, Marne A, MD   Brief Narrative:  63 y.o. female with medical history significant of atrial fibrillation on Coumadin, obesity hypoventilation syndrome with CPAP at nighttime, chronic respiratory failure with chronic 4 L of nasal cannula use, morbid obesity, hypothyroidism, chronic pressure ulcer wounds on bilateral posterior thighs presented on 01/08/2019 with shortness of breath.  She was found to have hemoglobin of 5.6; INR was 2.2.  FOBT was negative.  GI was consulted.  She was transfused 3 units of packed red cells.  Assessment & Plan:   Principal Problem:   Symptomatic anemia Active Problems:   Hypothyroidism   Diabetes type 2, controlled (HCC)   Morbid obesity (HCC)   Obesity hypoventilation syndrome (HCC)   Atrial fibrillation (HCC)   OSA (obstructive sleep apnea)   Symptomatic anemia/iron deficiency anemia -Presented with hemoglobin of 5.6.  FOBT was negative.  Patient has not had a colonoscopy yet. -Status post 2 units packed red cell transfusion.  Hemoglobin is 7.5 today. -GI evaluation appreciated.  Patient will need endoscopy/colonoscopy either inpatient or outpatient.  We will follow-up with further GI recommendations.  Continue oral Protonix.  Coumadin on hold.  Paroxysmal atrial fibrillation  -Coumadin has been on hold because of above.  INR was supratherapeutic on presentation.  INR is 2.5 today.  If no plans for inpatient procedure, will have to restart Coumadin.  ContinueDiltiazem.  Rate controlled  Chronic diastolic heart failure -Strict input and output.  Daily weights.  Continue oral Lasix.  Echo pending.  Will need outpatient cardiology evaluation.  Diabetes mellitus type 2 -Glipizide and metformin on hold.  Continue CBGs with sliding scale insulin  Chronic hypoxic respiratory failure  -Questionable cause.  Uses oxygen via nasal cannula at 4 L/min at baseline  Morbid obesity  -outpatient follow-up  OSA-CPAP nightly  Hyperlipidemia -Continue Zocor  Hypothyroidism -Continue Synthroid  Depression -Continue Prozac  Chronic bilateral posterior thigh wounds -Wound care evaluation appreciated.  Wound care as per her recommendations.  These are not pressure ulcers.     DVT prophylaxis: SCDs.  Coumadin on hold Code Status: Full Family Communication: None at bedside Disposition Plan: Home once cleared by GI  Consultants: GI  Procedures: None  Antimicrobials: None   Subjective: Patient seen and examined at bedside.  She denies any overnight fever, nausea, vomiting, black or bloody stools.  No worsening shortness of breath.  Objective: Vitals:   01/09/19 0135 01/09/19 0412 01/09/19 0747 01/09/19 0800  BP: (!) 112/56 (!) 105/46    Pulse: 65 64    Resp: 18 18    Temp: 99.2 F (37.3 C) 99.1 F (37.3 C)    TempSrc: Oral Oral    SpO2: 93% 98% 92% 92%  Weight:      Height:        Intake/Output Summary (Last 24 hours) at 01/09/2019 1018 Last data filed at 01/09/2019 0914 Gross per 24 hour  Intake 1839.47 ml  Output 550 ml  Net 1289.47 ml   Filed Weights   01/08/19 1156 01/08/19 1626  Weight: (!) 173.7 kg (!) 163.2 kg    Examination:  General exam: Appears calm and comfortable.  Looks older than stated age.  Looks chronically ill Respiratory system: Bilateral decreased breath sounds at bases with scattered basilar crackles Cardiovascular system: S1 & S2 heard, Rate controlled Gastrointestinal system:  Abdomen is morbidly obese nondistended, soft and nontender. Normal bowel sounds heard. Extremities: No cyanosis, clubbing; lower extremity edema present  Data Reviewed: I have personally reviewed following labs and imaging studies  CBC: Recent Labs  Lab 01/08/19 1208 01/08/19 2332 01/09/19 0602  WBC 8.0 6.8 6.5  NEUTROABS 6.7  --   --   HGB  5.6* 6.5* 7.5*  HCT 22.9* 24.2* 27.4*  MCV 76.8* 75.2* 76.5*  PLT 339 315 339   Basic Metabolic Panel: Recent Labs  Lab 01/08/19 1208 01/09/19 0602  NA 142 140  K 4.3 4.1  CL 103 100  CO2 28 32  GLUCOSE 103* 99  BUN 8 8  CREATININE 1.14* 1.06*  CALCIUM 10.9* 10.9*   GFR: Estimated Creatinine Clearance: 84.1 mL/min (A) (by C-Bryant formula based on SCr of 1.06 mg/dL (H)). Liver Function Tests: Recent Labs  Lab 01/08/19 1208  AST 14*  ALT 10  ALKPHOS 108  BILITOT 0.4  PROT 6.9  ALBUMIN 3.2*   No results for input(s): LIPASE, AMYLASE in the last 168 hours. No results for input(s): AMMONIA in the last 168 hours. Coagulation Profile: Recent Labs  Lab 01/05/19 01/08/19 1208 01/09/19 0602  INR 2.7 3.2* 2.5*   Cardiac Enzymes: Recent Labs  Lab 01/08/19 1208  TROPONINI <0.03   BNP (last 3 results) No results for input(s): PROBNP in the last 8760 hours. HbA1C: No results for input(s): HGBA1C in the last 72 hours. CBG: Recent Labs  Lab 01/08/19 1640 01/08/19 2126 01/09/19 0733  GLUCAP 79 93 91   Lipid Profile: No results for input(s): CHOL, HDL, LDLCALC, TRIG, CHOLHDL, LDLDIRECT in the last 72 hours. Thyroid Function Tests: Recent Labs    01/08/19 1208 01/08/19 1402  TSH 2.667  --   FREET4  --  1.32   Anemia Panel: Recent Labs    01/08/19 1208  VITAMINB12 120*  FOLATE 10.4  FERRITIN 4*  TIBC 480*  IRON 13*  RETICCTPCT 1.9   Sepsis Labs: No results for input(s): PROCALCITON, LATICACIDVEN in the last 168 hours.  No results found for this or any previous visit (from the past 240 hour(s)).       Radiology Studies: No results found.      Scheduled Meds: . albuterol  2.5 mg Nebulization TID  . atorvastatin  10 mg Oral QHS  . diltiazem  120 mg Oral Daily  . FLUoxetine  20 mg Oral Daily  . furosemide  80 mg Oral BID  . insulin aspart  0-15 Units Subcutaneous TID WC  . levothyroxine  175 mcg Oral Q0600  . loratadine  10 mg Oral Daily  .  mometasone-formoterol  2 puff Inhalation BID  . omega-3 acid ethyl esters  1 Bryant Oral BID  . pantoprazole  40 mg Oral Q0600  . sodium chloride flush  3 mL Intravenous Q12H   Continuous Infusions: . sodium chloride       LOS: 1 day        Glade Lloyd, MD [facility] 01/09/2019, 10:18 AM

## 2019-01-09 NOTE — Progress Notes (Signed)
  Echocardiogram 2D Echocardiogram has been performed.  Celene Skeen 01/09/2019, 11:13 AM

## 2019-01-09 NOTE — Evaluation (Signed)
Physical Therapy Evaluation Patient Details Name: Amy Bryant MRN: [id number] DOB: 08-24-1956 Today's Date: 01/09/2019   History of Present Illness  63 y.o. female with medical history significant of atrial fibrillation on Coumadin, obesity hypoventilation syndrome with CPAP at nighttime, chronic respiratory failure with chronic 4 L of nasal cannula use, morbid obesity, hypothyroidism, chronic pressure ulcer wounds on bilateral posterior thighs presented on 01/08/2019 with shortness of breath.  She was found to have hemoglobin of 5.6; INR was 2.2.  FOBT was negative.  GI was consulted.  She was transfused 3 units of packed red cells.    Clinical Impression  Pt admitted with above diagnosis. Pt currently with functional limitations due to the deficits listed below (see PT Problem List). PTA, pt reports 9 months of deconditioning with feeling out of breath, living with family 24/7 support, working with HHPT walking in house with RW. Today, patient sat independently but fatigued, de sat to 80 on 4L (which is her baseline). Pt feels she has support at home to return.  Pt will benefit from skilled PT to increase their independence and safety with mobility to allow discharge to the venue listed below.       Follow Up Recommendations Home health PT;Supervision/Assistance - 24 hour    Equipment Recommendations  (Tub Bench. Bari 3 IN 1)    Recommendations for Other Services OT consult     Precautions / Restrictions Precautions Precautions: Fall Restrictions Weight Bearing Restrictions: No      Mobility  Bed Mobility Overal bed mobility: Needs Assistance Bed Mobility: Supine to Sit     Supine to sit: Supervision        Transfers                 General transfer comment: deferred due to desat to 80 and pt reporting she feels too fatigued to stand  Ambulation/Gait                Stairs            Wheelchair Mobility    Modified Rankin (Stroke Patients Only)       Balance Overall balance assessment: Needs assistance   Sitting balance-Leahy Scale: Good                                       Pertinent Vitals/Pain Pain Assessment: Faces Faces Pain Scale: Hurts little more Pain Location: hips Pain Descriptors / Indicators: Aching Pain Intervention(s): Limited activity within patient's tolerance    Home Living Family/patient expects to be discharged to:: Private residence Living Arrangements: Spouse/significant other;Children Available Help at Discharge: Family;Available 24 hours/day Type of Home: House Home Access: Ramped entrance     Home Layout: One level Home Equipment: Walker - 2 wheels;Wheelchair - manual      Prior Function Level of Independence: Independent with assistive device(s)         Comments: pt reports she was mod I with ADLs, has caregivers 3x a week, family support 24/7, HHPT     Hand Dominance        Extremity/Trunk Assessment   Upper Extremity Assessment Upper Extremity Assessment: Generalized weakness    Lower Extremity Assessment Lower Extremity Assessment: Generalized weakness       Communication   Communication: No difficulties  Cognition Arousal/Alertness: Awake/alert Behavior During Therapy: WFL for tasks assessed/performed Overall Cognitive Status: Within Functional Limits for tasks assessed  General Comments      Exercises     Assessment/Plan    PT Assessment Patient needs continued PT services  PT Problem List Decreased strength;Decreased range of motion       PT Treatment Interventions DME instruction;Gait training;Stair training;Functional mobility training;Therapeutic activities;Therapeutic exercise;Balance training    PT Goals (Current goals can be found in the Care Plan section)  Acute Rehab PT Goals Patient Stated Goal: go home PT Goal Formulation: With patient Time For Goal Achievement:  01/23/19 Potential to Achieve Goals: Good    Frequency Min 3X/week   Barriers to discharge        Co-evaluation               AM-PAC PT "6 Clicks" Mobility  Outcome Measure Help needed turning from your back to your side while in a flat bed without using bedrails?: None Help needed moving from lying on your back to sitting on the side of a flat bed without using bedrails?: None Help needed moving to and from a bed to a chair (including a wheelchair)?: A Little Help needed standing up from a chair using your arms (e.g., wheelchair or bedside chair)?: A Little Help needed to walk in hospital room?: A Lot Help needed climbing 3-5 steps with a railing? : Total 6 Click Score: 17    End of Session Equipment Utilized During Treatment: Oxygen(4L) Activity Tolerance: Patient limited by fatigue Patient left: in bed Nurse Communication: Mobility status PT Visit Diagnosis: Unsteadiness on feet (R26.81)    Time: 1125-1150 PT Time Calculation (min) (ACUTE ONLY): 25 min   Charges:   PT Evaluation $PT Eval Moderate Complexity: 1 Mod PT Treatments $Therapeutic Activity: 8-22 mins        Etta Grandchild, PT, DPT Acute Rehabilitation Services Pager: [phone] Office: [phone]    Etta Grandchild 01/09/2019, 12:17 PM

## 2019-01-09 NOTE — Progress Notes (Signed)
RN attempted to place dressing on pt's bilateral posterior thigh pressure wounds, but due to pt's dyspnea on exertion, RN was unsuccessful and pt deferred dressing change. RN to report off to oncoming RN, and will retry later. RN to continue to monitor.

## 2019-01-09 NOTE — Progress Notes (Addendum)
Daily Rounding Note  01/09/2019, 12:11 PM  LOS: 1 day   SUBJECTIVE:   Chief complaint: IDA.  FOBT negative  No complaints.  No BM's.  Fells a little better  OBJECTIVE:         Vital signs in last 24 hours:    Temp:  [98.1 F (36.7 C)-99.2 F (37.3 C)] 99.1 F (37.3 C) (04/10 0412) Pulse Rate:  [58-72] 64 (04/10 0412) Resp:  [11-20] 18 (04/10 0412) BP: (98-130)/(37-83) 105/46 (04/10 0412) SpO2:  [90 %-100 %] 92 % (04/10 0800) Weight:  [163.2 kg] 163.2 kg (04/09 1626) Last BM Date: 01/07/19 Filed Weights   01/08/19 1156 01/08/19 1626  Weight: (!) 173.7 kg (!) 163.2 kg   General: obese, looks chronically ill   Heart: RRR Chest: clear bil.  No labored breathing Abdomen: soft, NT, obese, active BS  Extremities: no CCE Neuro/Psych:  Oriented x 3.  Fully alert, oriented  Intake/Output from previous day: 04/09 0701 - 04/10 0700 In: 1839.5 [P.O.:440; I.V.:13; Blood:1269.5; IV Piggyback:117] Out: -   Intake/Output this shift: Total I/O In: -  Out: 550 [Urine:550]  Lab Results: Recent Labs    01/08/19 1208 01/08/19 2332 01/09/19 0602  WBC 8.0 6.8 6.5  HGB 5.6* 6.5* 7.5*  HCT 22.9* 24.2* 27.4*  PLT 339 315 339   BMET Recent Labs    01/08/19 1208 01/09/19 0602  NA 142 140  K 4.3 4.1  CL 103 100  CO2 28 32  GLUCOSE 103* 99  BUN 8 8  CREATININE 1.14* 1.06*  CALCIUM 10.9* 10.9*   LFT Recent Labs    01/08/19 1208  PROT 6.9  ALBUMIN 3.2*  AST 14*  ALT 10  ALKPHOS 108  BILITOT 0.4   PT/INR Recent Labs    01/08/19 1208 01/09/19 0602  LABPROT 32.0* 26.3*  INR 3.2* 2.5*   Hepatitis Panel No results for input(s): HEPBSAG, HCVAB, HEPAIGM, HEPBIGM in the last 72 hours.  Studies/Results: No results found.  ASSESMENT:   *  IDA anemia.  FOBT negative.   Suspect multifactorial anemia with longstanding blood loss from leg wounds. Hgb 5.6 >> 3 U PRBC>>7.5.   Feraheme 4/9.      *   Chronic  coumadin.  On hold. Hx A fib, has been consistently in NSR s/p cardioversion 2010.    *   Morbid obesity.  OSA.  CHF.  Pulmonary hypertension.  COPD. Echo today: EF 60 to 65%, no signif pathology.      *    Chronic posterior skin breakdown of the thighs.  Constant oozing of blood from this area.  Reports soaking through multiple towels daily.     PLAN   *   Wonder if Coumadin can be discontinued since she has not had recurrent A fib since 2010.  However at risk for DVT given her physical state.     *  If when pt undergoes EGD/colonoscopy, would need doing as inpt given would have difficulty prepping at home.  Also would prefer to do procedures off Coumadin with normal INR,    Jennye MoccasinSarah Gribbin  01/09/2019, 12:11 PM Phone [phone]  GI ATTENDING  Case discussed with Dr. Meridee ScoreMansouraty.  Interval history and data reviewed.  Agree with interval progress note.  This patient has multiple significant comorbidities and presents with, amongst other things, profound iron deficiency anemia.  Hemoccult negative stool with no history of GI bleeding.  On chronic anticoagulation therapy in  the form of Coumadin.  Chronic bleeding from skin lesions.  Ideally, would work-up her iron deficiency anemia from a GI perspective to rule out a GI cause with colonoscopy and upper endoscopy to start.  She is at extraordinarily high risk given her body habitus and other comorbidities.  As well, this is not a patient that she would want to bring back again for therapeutic procedure because there were limitations at the time of her initial examination due to coagulopathy.  Thus, we should strongly consider endoscopic evaluation prior to discharge off anticoagulation.  If she requires a heparin window, that is okay.  Please let us know how he would like to handle her anticoagulation and we may proceed accordingly.  Wilhemina Bonito. Eda Keys., M.D. [facility] Division of Gastroenterology

## 2019-01-10 DIAGNOSIS — Z7901 Long term (current) use of anticoagulants: Secondary | ICD-10-CM

## 2019-01-10 DIAGNOSIS — K922 Gastrointestinal hemorrhage, unspecified: Secondary | ICD-10-CM

## 2019-01-10 LAB — BASIC METABOLIC PANEL
Anion gap: 9 (ref 5–15)
BUN: 9 mg/dL (ref 8–23)
CO2: 33 mmol/L — ABNORMAL HIGH (ref 22–32)
Calcium: 11 mg/dL — ABNORMAL HIGH (ref 8.9–10.3)
Chloride: 99 mmol/L (ref 98–111)
Creatinine, Ser: 1.12 mg/dL — ABNORMAL HIGH (ref 0.44–1.00)
GFR calc Af Amer: >60 mL/min (ref >60)
GFR calc non Af Amer: 52 mL/min — ABNORMAL LOW (ref >60)
Glucose, Bld: 103 mg/dL — ABNORMAL HIGH (ref 70–99)
Potassium: 3.6 mmol/L (ref 3.5–5.1)
Sodium: 141 mmol/L (ref 135–145)

## 2019-01-10 LAB — CBC WITH DIFFERENTIAL/PLATELET
Abs Immature Granulocytes: 0.03 10*3/uL (ref 0.00–0.07)
Basophils Absolute: 0 10*3/uL (ref 0.0–0.1)
Basophils Relative: 1 %
Eosinophils Absolute: 0.3 10*3/uL (ref 0.0–0.5)
Eosinophils Relative: 4 %
HCT: 27.7 % — ABNORMAL LOW (ref 36.0–46.0)
Hemoglobin: 7.6 g/dL — ABNORMAL LOW (ref 12.0–15.0)
Immature Granulocytes: 0 %
Lymphocytes Relative: 13 %
Lymphs Abs: 1 10*3/uL (ref 0.7–4.0)
MCH: 21.3 pg — ABNORMAL LOW (ref 26.0–34.0)
MCHC: 27.4 g/dL — ABNORMAL LOW (ref 30.0–36.0)
MCV: 77.8 fL — ABNORMAL LOW (ref 80.0–100.0)
Monocytes Absolute: 0.6 10*3/uL (ref 0.1–1.0)
Monocytes Relative: 7 %
Neutro Abs: 6 10*3/uL (ref 1.7–7.7)
Neutrophils Relative %: 75 %
Platelets: 319 10*3/uL (ref 150–400)
RBC: 3.56 MIL/uL — ABNORMAL LOW (ref 3.87–5.11)
RDW: 19.9 % — ABNORMAL HIGH (ref 11.5–15.5)
WBC: 7.9 10*3/uL (ref 4.0–10.5)
nRBC: 0.4 % — ABNORMAL HIGH (ref 0.0–0.2)

## 2019-01-10 LAB — BPAM RBC
Blood Product Expiration Date: 202004282359
Blood Product Expiration Date: 202004282359
Blood Product Expiration Date: 202004292359
ISSUE DATE / TIME: 202004091349
ISSUE DATE / TIME: 202004091725
ISSUE DATE / TIME: 202004100114
Unit Type and Rh: 7300
Unit Type and Rh: 7300
Unit Type and Rh: 7300

## 2019-01-10 LAB — TYPE AND SCREEN
ABO/RH(D): B POS
Antibody Screen: NEGATIVE
Unit division: 0
Unit division: 0
Unit division: 0

## 2019-01-10 LAB — GLUCOSE, CAPILLARY
Glucose-Capillary: 94 mg/dL (ref 70–99)
Glucose-Capillary: 99 mg/dL (ref 70–99)
Glucose-Capillary: 104 mg/dL — ABNORMAL HIGH (ref 70–99)
Glucose-Capillary: 128 mg/dL — ABNORMAL HIGH (ref 70–99)

## 2019-01-10 LAB — PROTIME-INR
INR: 2 — ABNORMAL HIGH (ref 0.8–1.2)
Prothrombin Time: 22.4 seconds — ABNORMAL HIGH (ref 11.4–15.2)

## 2019-01-10 LAB — MAGNESIUM: Magnesium: 2.1 mg/dL (ref 1.7–2.4)

## 2019-01-10 NOTE — Progress Notes (Signed)
Patient ID: Amy Bryant, female   DOB: 06/05/1956, 63 y.o.   MRN: [id number]  PROGRESS NOTE    Amy Bryant  WUJ:[id number] DOB: 01-22-1956 DOA: 01/08/2019 PCP: Judy Pimple, MD   Brief Narrative:  63 y.o. female with medical history significant of atrial fibrillation on Coumadin, obesity hypoventilation syndrome with CPAP at nighttime, chronic respiratory failure with chronic 4 L of nasal cannula use, morbid obesity, hypothyroidism, chronic pressure ulcer wounds on bilateral posterior thighs presented on 01/08/2019 with shortness of breath.  She was found to have hemoglobin of 5.6; INR was 2.2.  FOBT was negative.  GI was consulted.  She was transfused 3 units of packed red cells.  Assessment & Plan:   Principal Problem:   Absolute anemia Active Problems:   Hypothyroidism   Diabetes type 2, controlled (HCC)   Morbid obesity (HCC)   Obesity hypoventilation syndrome (HCC)   Atrial fibrillation (HCC)   OSA (obstructive sleep apnea)   Coagulopathy (HCC)   Chronic anticoagulation   Symptomatic anemia/iron deficiency anemia -Presented with hemoglobin of 5.6.  FOBT was negative.  Patient has not had a colonoscopy yet. -Status post 2 units packed red cell transfusion.  Hemoglobin is 7.6 today. -GI following: Recommend inpatient endoscopy/colonoscopy.  Patient is not decided yet.  We will continue to hold Coumadin, patient does not need IV heparin bridge for now. Continue oral Protonix.  Coumadin on hold.  Paroxysmal atrial fibrillation  -Coumadin has been on hold because of above.  INR was supratherapeutic on presentation.  INR is 2.0 today.  If patient decides against inpatient procedure, will restart Coumadin.  Encouraged patient to decide what she would want.  Outpatient follow-up with PCP/cardiology. Continue Diltiazem.  Rate controlled  Chronic diastolic heart failure -Strict input and output.  Daily weights.  Continue oral Lasix.  Echo shows EF of 60 to 65%.  Will need outpatient  cardiology evaluation.  Diabetes mellitus type 2 -Glipizide and metformin on hold.  Continue CBGs with sliding scale insulin  Chronic hypoxic respiratory failure -Questionable cause.  Uses oxygen via nasal cannula at 4 L/min at baseline  Morbid obesity  -outpatient follow-up  OSA-CPAP nightly  Hyperlipidemia -Continue Zocor  Hypothyroidism -Continue Synthroid  Depression -Continue Prozac  Chronic bilateral posterior thigh wounds -Wound care evaluation appreciated.  Wound care as per her recommendations.  These are not pressure ulcers.     DVT prophylaxis: SCDs.  Coumadin on hold Code Status: Full Family Communication: None at bedside Disposition Plan: Home once cleared by GI  Consultants: GI  Procedures: None  Antimicrobials: None   Subjective: Patient seen and examined at bedside.  She is a poor historian.  She does not remember having a conversation with me on 01/09/2019 when she had stated that she would not want inpatient GI procedures at this time.  She denies any overnight fever, nausea, vomiting, black or bloody stools.  Objective: Vitals:   01/09/19 2227 01/10/19 0535 01/10/19 0540 01/10/19 0804  BP: (!) 114/47 (!) 124/53    Pulse: 66 66    Resp: 18 18    Temp: 98.7 F (37.1 C) 97.9 F (36.6 C)    TempSrc: Oral Oral    SpO2: (!) 88% 94%  95%  Weight:   (!) 163 kg   Height:        Intake/Output Summary (Last 24 hours) at 01/10/2019 1023 Last data filed at 01/10/2019 0805 Gross per 24 hour  Intake 360 ml  Output 2950 ml  Net -2590 ml  Filed Weights   01/08/19 1156 01/08/19 1626 01/10/19 0540  Weight: (!) 173.7 kg (!) 163.2 kg (!) 163 kg    Examination:  General exam: Appears calm and comfortable.  Looks older than stated age.  Poor historian.  Looks chronically ill.  No distress Respiratory system: Bilateral decreased breath sounds at bases with scattered basilar crackles.  No wheezing Cardiovascular system: S1 & S2 heard, Rate controlled  Gastrointestinal system: Abdomen is morbidly obese nondistended, soft and nontender. Normal bowel sounds heard. Extremities: No cyanosis; lower extremity edema present  Data Reviewed: I have personally reviewed following labs and imaging studies  CBC: Recent Labs  Lab 01/08/19 1208 01/08/19 2332 01/09/19 0602 01/10/19 0346  WBC 8.0 6.8 6.5 7.9  NEUTROABS 6.7  --   --  6.0  HGB 5.6* 6.5* 7.5* 7.6*  HCT 22.9* 24.2* 27.4* 27.7*  MCV 76.8* 75.2* 76.5* 77.8*  PLT 339 315 339 319   Basic Metabolic Panel: Recent Labs  Lab 01/08/19 1208 01/09/19 0602 01/10/19 0346  NA 142 140 141  K 4.3 4.1 3.6  CL 103 100 99  CO2 28 32 33*  GLUCOSE 103* 99 103*  BUN 8 8 9   CREATININE 1.14* 1.06* 1.12*  CALCIUM 10.9* 10.9* 11.0*  MG  --   --  2.1   GFR: Estimated Creatinine Clearance: 79.5 mL/min (A) (by C-G formula based on SCr of 1.12 mg/dL (H)). Liver Function Tests: Recent Labs  Lab 01/08/19 1208  AST 14*  ALT 10  ALKPHOS 108  BILITOT 0.4  PROT 6.9  ALBUMIN 3.2*   No results for input(s): LIPASE, AMYLASE in the last 168 hours. No results for input(s): AMMONIA in the last 168 hours. Coagulation Profile: Recent Labs  Lab 01/05/19 01/08/19 1208 01/09/19 0602 01/10/19 0346  INR 2.7 3.2* 2.5* 2.0*   Cardiac Enzymes: Recent Labs  Lab 01/08/19 1208  TROPONINI <0.03   BNP (last 3 results) No results for input(s): PROBNP in the last 8760 hours. HbA1C: No results for input(s): HGBA1C in the last 72 hours. CBG: Recent Labs  Lab 01/09/19 0733 01/09/19 1151 01/09/19 1721 01/09/19 2225 01/10/19 0755  GLUCAP 91 118* 109* 129* 94   Lipid Profile: No results for input(s): CHOL, HDL, LDLCALC, TRIG, CHOLHDL, LDLDIRECT in the last 72 hours. Thyroid Function Tests: Recent Labs    01/08/19 1208 01/08/19 1402  TSH 2.667  --   FREET4  --  1.32   Anemia Panel: Recent Labs    01/08/19 1208  VITAMINB12 120*  FOLATE 10.4  FERRITIN 4*  TIBC 480*  IRON 13*  RETICCTPCT  1.9   Sepsis Labs: No results for input(s): PROCALCITON, LATICACIDVEN in the last 168 hours.  No results found for this or any previous visit (from the past 240 hour(s)).       Radiology Studies: No results found.      Scheduled Meds: . albuterol  2.5 mg Nebulization TID  . atorvastatin  10 mg Oral QHS  . diltiazem  120 mg Oral Daily  . FLUoxetine  20 mg Oral Daily  . furosemide  80 mg Oral BID  . insulin aspart  0-15 Units Subcutaneous TID WC  . levothyroxine  175 mcg Oral Q0600  . loratadine  10 mg Oral Daily  . mometasone-formoterol  2 puff Inhalation BID  . omega-3 acid ethyl esters  1 g Oral BID  . pantoprazole  40 mg Oral Q0600  . sodium chloride flush  3 mL Intravenous Q12H   Continuous Infusions: .  sodium chloride       LOS: 2 days        Glade LloydKshitiz Loch Lloyd Carmack, MD [facility] 01/10/2019, 10:23 AM

## 2019-01-10 NOTE — Progress Notes (Addendum)
[facility] Progress Note   Chief Complaint:   Iron deficiency anemia   SUBJECTIVE:    no complaints   ASSESSMENT AND PLAN:   Severe IDA, no overt GI bleeding and FOBT negative, on coumadin. Presented with hgb of 5.6, up to only 7.6 after 3 units of PRBC. Reports chronic intermittent bleeding from sores on back of legs ( sometimes "soaks a towel" but seems unlikely to results in this degree of anemia.  -INR is drifting, at 2 today. Plean is for EGD and colonoscopy on Monday. Will see her again in am.   OBJECTIVE:     Vital signs in last 24 hours: Temp:  [97.9 F (36.6 C)-99 F (37.2 C)] 97.9 F (36.6 C) (04/11 0535) Pulse Rate:  [66-68] 66 (04/11 0535) Resp:  [18-19] 18 (04/11 0535) BP: (110-124)/(47-60) 124/53 (04/11 0535) SpO2:  [88 %-95 %] 95 % (04/11 0804) Weight:  [759 kg] 163 kg (04/11 0540) Last BM Date: 01/07/19 General:   Alert, female in NAD EENT:  Normal hearing, non icteric sclera, conjunctive pink.  Heart:  Regular rate and rhythm; + murmur.  No lower extremity edema   Pulm: Normal respiratory effort Abdomen:  Soft, obese,  nontender.  Normal bowel sounds, Neurologic:  Alert and  oriented x4;  grossly normal neurologically. Psych:  Pleasant, cooperative.  Normal mood and affect.   Intake/Output from previous day: 04/10 0701 - 04/11 0700 In: 240 [P.O.:240] Out: 3500 [Urine:3500] Intake/Output this shift: Total I/O In: 120 [P.O.:120] Out: -   Lab Results: Recent Labs    01/08/19 2332 01/09/19 0602 01/10/19 0346  WBC 6.8 6.5 7.9  HGB 6.5* 7.5* 7.6*  HCT 24.2* 27.4* 27.7*  PLT 315 339 319   BMET Recent Labs    01/08/19 1208 01/09/19 0602 01/10/19 0346  NA 142 140 141  K 4.3 4.1 3.6  CL 103 100 99  CO2 28 32 33*  GLUCOSE 103* 99 103*  BUN 8 8 9   CREATININE 1.14* 1.06* 1.12*  CALCIUM 10.9* 10.9* 11.0*   LFT Recent Labs    01/08/19 1208  PROT 6.9  ALBUMIN 3.2*  AST 14*  ALT 10  ALKPHOS 108  BILITOT 0.4    PT/INR Recent Labs    01/09/19 0602 01/10/19 0346  LABPROT 26.3* 22.4*  INR 2.5* 2.0*   Hepatitis Panel No results for input(s): HEPBSAG, HCVAB, HEPAIGM, HEPBIGM in the last 72 hours.  No results found.   Principal Problem:   Absolute anemia Active Problems:   Hypothyroidism   Diabetes type 2, controlled (HCC)   Morbid obesity (HCC)   Obesity hypoventilation syndrome (HCC)   Atrial fibrillation (HCC)   OSA (obstructive sleep apnea)   Coagulopathy (HCC)   Chronic anticoagulation     LOS: 2 days   Willette Cluster ,NP 01/10/2019, 10:31 AM  GI ATTENDING  Interval history and data reviewed.  Patient personally seen and examined.  Agree with interval progress note as outlined above.  The patient is resting comfortably in bed.  No new complaints.  No obvious GI bleeding.  Being evaluated for iron deficiency anemia as noted.  Still with intermittent bleeding from posterior leg wounds.  Coumadin being held.  INR currently 2.  The plan is for colonoscopy and upper endoscopy on Monday.  She is HIGH RISK given her comorbidities, body habitus, and the need to address chronic anticoagulation therapy.The nature of the procedure, as well as the risks, benefits, and alternatives were carefully and thoroughly reviewed with  the patient. Ample time for discussion and questions allowed. The patient understood, was satisfied, and agreed to proceed.  Wilhemina BonitoJohn N. Eda KeysPerry, Jr., M.D. [facility] Division of Gastroenterology

## 2019-01-10 NOTE — Progress Notes (Signed)
Occupational Therapy Evaluation Patient Details Name: Amy Bryant MRN: [id number] DOB: [birth year] Today's Date: 01/10/2019    History of Present Illness 63 y.o. female with medical history significant of atrial fibrillation on Coumadin, obesity hypoventilation syndrome with CPAP at nighttime, chronic respiratory failure with chronic 4 L of nasal cannula use, morbid obesity, hypothyroidism, chronic pressure ulcer wounds on bilateral posterior thighs presented on 01/08/2019 with shortness of breath.  She was found to have hemoglobin of 5.6; INR was 2.2.  FOBT was negative.  GI was consulted.  She was transfused 3 units of packed red cells.   Clinical Impression   PTA, pt was living at home with her husband, child, and grandchildren, pt reports he required some assistance with ADL/IADL and was mod independent with functional mobility at RW level, stating she was able to walk a few feet. Pt was on 4lnc at baseline. Pt currently requires minA-modA for ADL while seated at bedlevel. Pt able to tolerate sit<>stand x1, limited due to increased fatigue and desat to 86% on 4lnc. Due to decline in current level of function, pt would benefit from acute OT to address established goals to facilitate safe D/C to venue listed below. Pt states that she has appropriate support at home. At this time, recommend HHOT follow-up. Will continue to follow acutely.     Follow Up Recommendations  Home health OT;Supervision/Assistance - 24 hour    Equipment Recommendations  3 in 1 bedside commode(requires bariatric)    Recommendations for Other Services PT consult     Precautions / Restrictions Precautions Precautions: Fall Restrictions Weight Bearing Restrictions: No      Mobility Bed Mobility Overal bed mobility: Needs Assistance Bed Mobility: Supine to Sit;Sit to Supine     Supine to sit: Supervision Sit to supine: Mod assist;HOB elevated   General bed mobility comments: modA to retrun BLE onto bed    Transfers Overall transfer level: Needs assistance Equipment used: Rolling walker (2 wheeled) Transfers: Sit to/from Stand Sit to Stand: Mod assist              Balance Overall balance assessment: Needs assistance Sitting-balance support: No upper extremity supported;Feet supported Sitting balance-Leahy Scale: Good     Standing balance support: Bilateral upper extremity supported Standing balance-Leahy Scale: Poor Standing balance comment: unable to stand upright, required heavy lean on BUE support                           ADL either performed or assessed with clinical judgement   ADL Overall ADL's : Needs assistance/impaired Eating/Feeding: Set up;Sitting;Bed level   Grooming: Set up;Sitting Grooming Details (indicate cue type and reason): pt washed face while sitting EOB Upper Body Bathing: Min guard;Sitting   Lower Body Bathing: Moderate assistance Lower Body Bathing Details (indicate cue type and reason): modA for sit<>stand Upper Body Dressing : Set up;Sitting   Lower Body Dressing: Total assistance Lower Body Dressing Details (indicate cue type and reason): totalA to don/doff socks   Toilet Transfer Details (indicate cue type and reason): did not attempt transfer secondary to pt reports of dizziness and desat to 86% with initial stand         Functional mobility during ADLs: Moderate assistance;Maximal assistance;Rolling walker General ADL Comments: pt able to perform sit<>stand 1x, limited in additional participation in session secondary to desat and reports of dizziness      Vision         Perception  Praxis      Pertinent Vitals/Pain Pain Assessment: 0-10 Pain Score: 5  Faces Pain Scale: Hurts little more Pain Location: hips, BLE  Pain Descriptors / Indicators: Aching Pain Intervention(s): Limited activity within patient's tolerance;Monitored during session     Hand Dominance     Extremity/Trunk Assessment Upper Extremity  Assessment Upper Extremity Assessment: Generalized weakness   Lower Extremity Assessment Lower Extremity Assessment: Defer to PT evaluation       Communication Communication Communication: No difficulties   Cognition Arousal/Alertness: Awake/alert Behavior During Therapy: WFL for tasks assessed/performed Overall Cognitive Status: Within Functional Limits for tasks assessed                                     General Comments  4lnc 95%-86% spO2 with movement    Exercises     Shoulder Instructions      Home Living Family/patient expects to be discharged to:: Private residence Living Arrangements: Spouse/significant other;Children Available Help at Discharge: Family;Available 24 hours/day Type of Home: House Home Access: Ramped entrance     Home Layout: One level     Bathroom Shower/Tub: Chief Strategy Officer: Standard     Home Equipment: Environmental consultant - 2 wheels;Wheelchair - manual          Prior Functioning/Environment Level of Independence: Independent with assistive device(s)        Comments: pt reports she was mod I with ADLs, has caregivers 3x a week, family support 24/7, HHPT        OT Problem List: Decreased activity tolerance;Impaired balance (sitting and/or standing);Decreased safety awareness;Decreased knowledge of use of DME or AE;Cardiopulmonary status limiting activity;Obesity;Pain;Decreased strength;Decreased range of motion      OT Treatment/Interventions:      OT Goals(Current goals can be found in the care plan section) Acute Rehab OT Goals Patient Stated Goal: to go home OT Goal Formulation: With patient Time For Goal Achievement: 01/24/19 Potential to Achieve Goals: Good ADL Goals Pt Will Perform Grooming: with modified independence Pt Will Perform Upper Body Bathing: with modified independence Pt Will Perform Lower Body Dressing: with modified independence;with adaptive equipment Pt Will Transfer to Toilet:  with modified independence;ambulating Additional ADL Goal #1: Pt will demonstrate use of 3 energy conservation strategies during ADL completion.  OT Frequency: Min 2X/week   Barriers to D/C: Decreased caregiver support          Co-evaluation              AM-PAC OT "6 Clicks" Daily Activity     Outcome Measure Help from another person eating meals?: None Help from another person taking care of personal grooming?: A Little Help from another person toileting, which includes using toliet, bedpan, or urinal?: A Lot Help from another person bathing (including washing, rinsing, drying)?: A Lot Help from another person to put on and taking off regular upper body clothing?: A Little Help from another person to put on and taking off regular lower body clothing?: Total 6 Click Score: 15   End of Session Equipment Utilized During Treatment: Gait belt;Rolling walker;Oxygen Nurse Communication: Mobility status  Activity Tolerance: Patient limited by fatigue Patient left: in bed;with call bell/phone within reach;with bed alarm set  OT Visit Diagnosis: Unsteadiness on feet (R26.81);Other abnormalities of gait and mobility (R26.89);Muscle weakness (generalized) (M62.81);Pain Pain - Right/Left: Left Pain - part of body: Leg;Hip  Time: [id number] OT Time Calculation (min): 26 min Charges:  OT General Charges $OT Visit: 1 Visit OT Evaluation $OT Eval Moderate Complexity: 1 Mod OT Treatments $Self Care/Home Management : 8-22 mins  Diona Brownereresa Petrice Beedy OTR/L Acute Rehabilitation Services Office: [phone]   Rebeca Alerteresa J Kenesha Moshier 01/10/2019, 10:38 AM

## 2019-01-10 NOTE — Progress Notes (Addendum)
Physical Therapy Treatment Patient Details Name: Amy Bryant MRN: [id number] DOB: Dec 23, 1955 Today's Date: 01/10/2019    History of Present Illness Pt is a 63 y/o female with medical history significant of atrial fibrillation on Coumadin, obesity hypoventilation syndrome with CPAP at nighttime, chronic respiratory failure with chronic 4 L of nasal cannula use, morbid obesity, hypothyroidism, chronic pressure ulcer wounds on bilateral posterior thighs presented on 01/08/2019 with shortness of breath.  She was found to have hemoglobin of 5.6; INR was 2.2.  FOBT was negative.  GI was consulted. She was transfused 3 units of PRBC.    PT Comments    Pt stating "I've already done this today" when therapist arrived. PT explained the importance of OOB mobility more than just once daily. However, pt only agreeable to bed level therex (see below for details). Pt's RT entering room, needing to perform a breathing treatment. Pt would continue to benefit from skilled physical therapy services at this time while admitted and after d/c to address the below listed limitations in order to improve overall safety and independence with functional mobility.  Pt on 4L of O2 throughout with SPO2 maintaining >93%.     Follow Up Recommendations  Supervision/Assistance - 24 hour;Home health PT;Other (comment)(pt refusing SNF, adamant about returning home)     Equipment Recommendations  3in1 (PT);Other (comment)(bari 3-in-1)    Recommendations for Other Services       Precautions / Restrictions Precautions Precautions: Fall Precaution Comments: monitor SPO2 Restrictions Weight Bearing Restrictions: No    Mobility  Bed Mobility               General bed mobility comments: pt only agreeable to bed level therex  Transfers                    Ambulation/Gait                 Stairs             Wheelchair Mobility    Modified Rankin (Stroke Patients Only)       Balance                                             Cognition Arousal/Alertness: Awake/alert Behavior During Therapy: WFL for tasks assessed/performed Overall Cognitive Status: Within Functional Limits for tasks assessed                                        Exercises General Exercises - Lower Extremity Ankle Circles/Pumps: AROM;Both;20 reps;Supine Quad Sets: AROM;Strengthening;Both;10 reps;Supine Heel Slides: AROM;Both;5 reps;Supine Hip ABduction/ADduction: AROM;Strengthening;Both;10 reps;Supine    General Comments        Pertinent Vitals/Pain Pain Assessment: Faces Faces Pain Scale: Hurts little more Pain Location: bilateral LEs Pain Descriptors / Indicators: Aching Pain Intervention(s): Monitored during session    Home Living                      Prior Function            PT Goals (current goals can now be found in the care plan section) Acute Rehab PT Goals PT Goal Formulation: With patient Time For Goal Achievement: 01/23/19 Potential to Achieve Goals: Good Progress towards PT goals: Progressing toward goals  Frequency    Min 3X/week      PT Plan Current plan remains appropriate    Co-evaluation              AM-PAC PT "6 Clicks" Mobility   Outcome Measure  Help needed turning from your back to your side while in a flat bed without using bedrails?: A Little Help needed moving from lying on your back to sitting on the side of a flat bed without using bedrails?: A Little Help needed moving to and from a bed to a chair (including a wheelchair)?: A Little Help needed standing up from a chair using your arms (e.g., wheelchair or bedside chair)?: A Little Help needed to walk in hospital room?: A Lot Help needed climbing 3-5 steps with a railing? : Total 6 Click Score: 15    End of Session Equipment Utilized During Treatment: Oxygen(4 L of O2 via [place]) Activity Tolerance: Patient limited by fatigue;Patient limited  by pain Patient left: in bed;with call bell/phone within reach;with bed alarm set Nurse Communication: Mobility status PT Visit Diagnosis: Unsteadiness on feet (R26.81)     Time: 1248-1300 PT Time Calculation (min) (ACUTE ONLY): 12 min  Charges:  $Therapeutic Exercise: 8-22 mins                     Jennings Chalk, PT, DPT  Acute Rehabilitation Services Pager [phone] Office [phone]     [name] 01/10/2019, 2:20 PM

## 2019-01-11 DIAGNOSIS — D649 Anemia, unspecified: Secondary | ICD-10-CM

## 2019-01-11 LAB — BASIC METABOLIC PANEL
Anion gap: 7 (ref 5–15)
BUN: 9 mg/dL (ref 8–23)
CO2: 36 mmol/L — ABNORMAL HIGH (ref 22–32)
Calcium: 11.3 mg/dL — ABNORMAL HIGH (ref 8.9–10.3)
Chloride: 98 mmol/L (ref 98–111)
Creatinine, Ser: 0.99 mg/dL (ref 0.44–1.00)
GFR calc Af Amer: >60 mL/min (ref >60)
GFR calc non Af Amer: >60 mL/min (ref >60)
Glucose, Bld: 105 mg/dL — ABNORMAL HIGH (ref 70–99)
Potassium: 3.5 mmol/L (ref 3.5–5.1)
Sodium: 141 mmol/L (ref 135–145)

## 2019-01-11 LAB — CBC WITH DIFFERENTIAL/PLATELET
Abs Immature Granulocytes: 0.04 10*3/uL (ref 0.00–0.07)
Basophils Absolute: 0 10*3/uL (ref 0.0–0.1)
Basophils Relative: 0 %
Eosinophils Absolute: 0.4 10*3/uL (ref 0.0–0.5)
Eosinophils Relative: 4 %
HCT: 27.7 % — ABNORMAL LOW (ref 36.0–46.0)
Hemoglobin: 7.6 g/dL — ABNORMAL LOW (ref 12.0–15.0)
Immature Granulocytes: 1 %
Lymphocytes Relative: 10 %
Lymphs Abs: 0.9 10*3/uL (ref 0.7–4.0)
MCH: 21.7 pg — ABNORMAL LOW (ref 26.0–34.0)
MCHC: 27.4 g/dL — ABNORMAL LOW (ref 30.0–36.0)
MCV: 78.9 fL — ABNORMAL LOW (ref 80.0–100.0)
Monocytes Absolute: 0.6 10*3/uL (ref 0.1–1.0)
Monocytes Relative: 7 %
Neutro Abs: 6.9 10*3/uL (ref 1.7–7.7)
Neutrophils Relative %: 78 %
Platelets: 317 10*3/uL (ref 150–400)
RBC: 3.51 MIL/uL — ABNORMAL LOW (ref 3.87–5.11)
RDW: 20.9 % — ABNORMAL HIGH (ref 11.5–15.5)
WBC: 8.8 10*3/uL (ref 4.0–10.5)
nRBC: 0.2 % (ref 0.0–0.2)

## 2019-01-11 LAB — GLUCOSE, CAPILLARY
Glucose-Capillary: 103 mg/dL — ABNORMAL HIGH (ref 70–99)
Glucose-Capillary: 108 mg/dL — ABNORMAL HIGH (ref 70–99)
Glucose-Capillary: 124 mg/dL — ABNORMAL HIGH (ref 70–99)
Glucose-Capillary: 117 mg/dL — ABNORMAL HIGH (ref 70–99)

## 2019-01-11 LAB — PROTIME-INR
INR: 1.5 — ABNORMAL HIGH (ref 0.8–1.2)
Prothrombin Time: 18.1 seconds — ABNORMAL HIGH (ref 11.4–15.2)

## 2019-01-11 LAB — MAGNESIUM: Magnesium: 2.1 mg/dL (ref 1.7–2.4)

## 2019-01-11 MED ORDER — PEG-KCL-NACL-NASULF-NA ASC-C 100 G PO SOLR
0.5000 | Freq: Once | ORAL | Status: AC
  Administered 2019-01-12: 100 g via ORAL
  Filled 2019-01-11: qty 1

## 2019-01-11 MED ORDER — PEG-KCL-NACL-NASULF-NA ASC-C 100 G PO SOLR
0.5000 | Freq: Once | ORAL | Status: DC
  Filled 2019-01-11: qty 1

## 2019-01-11 MED ORDER — PEG-KCL-NACL-NASULF-NA ASC-C 100 G PO SOLR: 1.0000 | Freq: Once | ORAL | Status: DC

## 2019-01-11 MED ORDER — ALPRAZOLAM 0.25 MG PO TABS
0.2500 mg | ORAL_TABLET | Freq: Once | ORAL | Status: AC
  Administered 2019-01-11: 0.25 mg via ORAL
  Filled 2019-01-11: qty 1

## 2019-01-11 MED ORDER — POTASSIUM CHLORIDE CRYS ER 20 MEQ PO TBCR
40.0000 meq | EXTENDED_RELEASE_TABLET | Freq: Once | ORAL | Status: AC
  Administered 2019-01-11: 40 meq via ORAL
  Filled 2019-01-11: qty 2

## 2019-01-11 NOTE — Plan of Care (Signed)
  Problem: Activity: Goal: Risk for activity intolerance will decrease Outcome: Progressing   Problem: Nutrition: Goal: Adequate nutrition will be maintained Outcome: Progressing   Problem: Elimination: Goal: Will not experience complications related to urinary retention Outcome: Progressing   Problem: Pain Managment: Goal: General experience of comfort will improve Outcome: Progressing   Problem: Safety: Goal: Ability to remain free from injury will improve Outcome: Progressing   

## 2019-01-11 NOTE — Progress Notes (Addendum)
     Progress Note      ASSESSMENT AND PLAN:   No complaints   SUBJECTIVE   Cc: anemia  1. 62 yo female with severe IDA / FOBT negative, on coumadin ( on hold). Presenting hgb 5.6. After 3 u PRBC hemoglobin has remained stable at 7.6 -INR 1.5 now -We will need EGD and colonoscopy with general anesthesia tomorrow. The risks and benefits of colonoscopy with possible polypectomy / biopsies were discussed and the patient agrees to proceed.   2.  OSA /CPAP / chronic hypoxic respiratory failure, home O2, 4 L.  3.  PAF, Coumadin on hold  4.  Chronic diastolic failure, EF 60 to 65%, on Lasix.  OBJECTIVE:     Vital signs in last 24 hours: Temp:  [98.3 F (36.8 C)-98.6 F (37 C)] 98.3 F (36.8 C) (04/12 0604) Pulse Rate:  [64] 64 (04/12 0604) Resp:  [16] 16 (04/12 0604) BP: (122-132)/(43-65) 123/48 (04/12 0604) SpO2:  [93 %-98 %] 95 % (04/12 0814) Weight:  [158.6 kg] 158.6 kg (04/12 0604) Last BM Date: 01/08/19 General:   Alert, obese female in NAD EENT:  Normal hearing, non icteric sclera  Heart:  Regular rate and rhythm; + murmur.  No lower extremity edema   Pulm: Normal respiratory effort, lungs CTA bilaterally without wheezes or crackles. Abdomen:  Soft, nondistended, nontender.  Normal bowel sounds Neurologic:  Alert and  oriented x4;  grossly normal neurologically. Psych:  Pleasant, cooperative.  Normal mood and affect.   Intake/Output from previous day: 04/11 0701 - 04/12 0700 In: 360 [P.O.:360] Out: 1000 [Urine:1000] Intake/Output this shift: No intake/output data recorded.  Lab Results: Recent Labs    01/09/19 0602 01/10/19 0346 01/11/19 0302  WBC 6.5 7.9 8.8  HGB 7.5* 7.6* 7.6*  HCT 27.4* 27.7* 27.7*  PLT 339 319 317   BMET Recent Labs    01/09/19 0602 01/10/19 0346 01/11/19 0302  NA 140 141 141  K 4.1 3.6 3.5  CL 100 99 98  CO2 32 33* 36*  GLUCOSE 99 103* 105*  BUN 8 9 9  CREATININE 1.06* 1.12* 0.99  CALCIUM 10.9* 11.0* 11.3*   LFT  Recent Labs    01/08/19 1208  PROT 6.9  ALBUMIN 3.2*  AST 14*  ALT 10  ALKPHOS 108  BILITOT 0.4   PT/INR Recent Labs    01/10/19 0346 01/11/19 0846  LABPROT 22.4* 18.1*  INR 2.0* 1.5*   Hepatitis Panel No results for input(s): HEPBSAG, HCVAB, HEPAIGM, HEPBIGM in the last 72 hours.  No results found.   Principal Problem:   Absolute anemia Active Problems:   Hypothyroidism   Diabetes type 2, controlled (HCC)   Morbid obesity (HCC)   Obesity hypoventilation syndrome (HCC)   Atrial fibrillation (HCC)   OSA (obstructive sleep apnea)   Coagulopathy (HCC)   Chronic anticoagulation     LOS: 3 days   Paula Guenther ,NP 01/11/2019, 10:36 AM  GI ATTENDING  Interval history data reviewed.  Agree with interval progress note.  No changes from yesterday.  The patient is stable.  Her INR is nearing normal and acceptable.  Plans for colonoscopy and upper endoscopy with anesthesiology supervision tomorrow as the patient is at very high risk given her obesity and complications thereof.  Dr. Pyrtle to perform the procedures.  I discussed this with the patient yesterday.  John N. Perry, Jr., M.D. [facility] Division of Gastroenterology         

## 2019-01-11 NOTE — H&P (View-Only) (Signed)
     Progress Note      ASSESSMENT AND PLAN:   No complaints   SUBJECTIVE   Cc: anemia  1. 63 yo female with severe IDA / FOBT negative, on coumadin ( on hold). Presenting hgb 5.6. After 3 u PRBC hemoglobin has remained stable at 7.6 -INR 1.5 now -We will need EGD and colonoscopy with general anesthesia tomorrow. The risks and benefits of colonoscopy with possible polypectomy / biopsies were discussed and the patient agrees to proceed.   2.  OSA /CPAP / chronic hypoxic respiratory failure, home O2, 4 L.  3.  PAF, Coumadin on hold  4.  Chronic diastolic failure, EF 60 to 65%, on Lasix.  OBJECTIVE:     Vital signs in last 24 hours: Temp:  [98.3 F (36.8 C)-98.6 F (37 C)] 98.3 F (36.8 C) (04/12 0604) Pulse Rate:  [64] 64 (04/12 0604) Resp:  [16] 16 (04/12 0604) BP: (122-132)/(43-65) 123/48 (04/12 0604) SpO2:  [93 %-98 %] 95 % (04/12 0814) Weight:  [158.6 kg] 158.6 kg (04/12 0604) Last BM Date: 01/08/19 General:   Alert, obese female in NAD EENT:  Normal hearing, non icteric sclera  Heart:  Regular rate and rhythm; + murmur.  No lower extremity edema   Pulm: Normal respiratory effort, lungs CTA bilaterally without wheezes or crackles. Abdomen:  Soft, nondistended, nontender.  Normal bowel sounds Neurologic:  Alert and  oriented x4;  grossly normal neurologically. Psych:  Pleasant, cooperative.  Normal mood and affect.   Intake/Output from previous day: 04/11 0701 - 04/12 0700 In: 360 [P.O.:360] Out: 1000 [Urine:1000] Intake/Output this shift: No intake/output data recorded.  Lab Results: Recent Labs    01/09/19 0602 01/10/19 0346 01/11/19 0302  WBC 6.5 7.9 8.8  HGB 7.5* 7.6* 7.6*  HCT 27.4* 27.7* 27.7*  PLT 339 319 317   BMET Recent Labs    01/09/19 0602 01/10/19 0346 01/11/19 0302  NA 140 141 141  K 4.1 3.6 3.5  CL 100 99 98  CO2 32 33* 36*  GLUCOSE 99 103* 105*  BUN 8 9 9   CREATININE 1.06* 1.12* 0.99  CALCIUM 10.9* 11.0* 11.3*   LFT  Recent Labs    01/08/19 1208  PROT 6.9  ALBUMIN 3.2*  AST 14*  ALT 10  ALKPHOS 108  BILITOT 0.4   PT/INR Recent Labs    01/10/19 0346 01/11/19 0846  LABPROT 22.4* 18.1*  INR 2.0* 1.5*   Hepatitis Panel No results for input(s): HEPBSAG, HCVAB, HEPAIGM, HEPBIGM in the last 72 hours.  No results found.   Principal Problem:   Absolute anemia Active Problems:   Hypothyroidism   Diabetes type 2, controlled (HCC)   Morbid obesity (HCC)   Obesity hypoventilation syndrome (HCC)   Atrial fibrillation (HCC)   OSA (obstructive sleep apnea)   Coagulopathy (HCC)   Chronic anticoagulation     LOS: 3 days   Willette Cluster ,NP 01/11/2019, 10:36 AM  GI ATTENDING  Interval history data reviewed.  Agree with interval progress note.  No changes from yesterday.  The patient is stable.  Her INR is nearing normal and acceptable.  Plans for colonoscopy and upper endoscopy with anesthesiology supervision tomorrow as the patient is at very high risk given her obesity and complications thereof.  Dr. Rhea Belton to perform the procedures.  I discussed this with the patient yesterday.  Wilhemina Bonito. Eda Keys., M.D. [facility] Division of Gastroenterology

## 2019-01-11 NOTE — Progress Notes (Signed)
Patient ID: Amy Bryant, female   DOB: Sep 22, 1956, 63 y.o.   MRN: [id number]  PROGRESS NOTE    Amy Bryant  MEB:[id number] DOB: 1956/03/26 DOA: 01/08/2019 PCP: Judy Pimple, MD   Brief Narrative:  63 y.o. female with medical history significant of atrial fibrillation on Coumadin, obesity hypoventilation syndrome with CPAP at nighttime, chronic respiratory failure with chronic 4 L of nasal cannula use, morbid obesity, hypothyroidism, chronic pressure ulcer wounds on bilateral posterior thighs presented on 01/08/2019 with shortness of breath.  She was found to have hemoglobin of 5.6; INR was 2.2.  FOBT was negative.  GI was consulted.  She was transfused 3 units of packed red cells.  Assessment & Plan:   Principal Problem:   Absolute anemia Active Problems:   Hypothyroidism   Diabetes type 2, controlled (HCC)   Morbid obesity (HCC)   Obesity hypoventilation syndrome (HCC)   Atrial fibrillation (HCC)   OSA (obstructive sleep apnea)   Coagulopathy (HCC)   Chronic anticoagulation   Symptomatic anemia/iron deficiency anemia -Presented with hemoglobin of 5.6.  FOBT was negative.  Patient has not had a colonoscopy yet. -Status post 2 units packed red cell transfusion.  Hemoglobin is 7.6 today. -GI following: Plan for EGD and colonoscopy on 01/12/2019 by GI.  We will continue to hold Coumadin, patient does not need IV heparin bridge for now. Continue oral Protonix.    Paroxysmal atrial fibrillation  -Coumadin has been on hold because of above.  INR was supratherapeutic on presentation.  Follow INR for today. Outpatient follow-up with PCP/cardiology. Continue Diltiazem.  Rate controlled  Chronic diastolic heart failure -Strict input and output.  Daily weights.  Continue oral Lasix.  Echo shows EF of 60 to 65%.  Will need outpatient cardiology evaluation.  Diabetes mellitus type 2 -Glipizide and metformin on hold.  Continue CBGs with sliding scale insulin  Chronic hypoxic respiratory  failure -Questionable cause.  Uses oxygen via nasal cannula at 4 L/min at baseline  Morbid obesity  -outpatient follow-up  OSA-CPAP nightly  Hyperlipidemia -Continue Zocor  Hypothyroidism -Continue Synthroid  Depression -Continue Prozac  Chronic bilateral posterior thigh wounds -Wound care evaluation appreciated.  Wound care as per her recommendations.  These are not pressure ulcers.     DVT prophylaxis: SCDs.  Coumadin on hold Code Status: Full Family Communication: None at bedside Disposition Plan: Home once cleared by GI  Consultants: GI  Procedures: None  Antimicrobials: None   Subjective: Patient seen and examined at bedside.  She is a poor historian.  Denies any overnight fever, stools or vomiting.   Objective: Vitals:   01/10/19 1928 01/10/19 2050 01/11/19 0604 01/11/19 0814  BP:  (!) 122/43 (!) 123/48   Pulse:  64 64   Resp:  16 16   Temp:  98.6 F (37 C) 98.3 F (36.8 C)   TempSrc:   Oral   SpO2: 98% 93% 94% 95%  Weight:   (!) 158.6 kg   Height:        Intake/Output Summary (Last 24 hours) at 01/11/2019 0920 Last data filed at 01/10/2019 2302 Gross per 24 hour  Intake 240 ml  Output 1000 ml  Net -760 ml   Filed Weights   01/08/19 1626 01/10/19 0540 01/11/19 0604  Weight: (!) 163.2 kg (!) 163 kg (!) 158.6 kg    Examination:  General exam: Appears calm and comfortable.  Looks older than stated age.  Poor historian.  Looks chronically ill.  No acute distress.  Respiratory system: Bilateral decreased breath sounds at bases with scattered basilar crackles.  Cardiovascular system: Rate controlled, S1-S2 heard Gastrointestinal system: Abdomen is morbidly obese nondistended, soft and nontender. Normal bowel sounds heard. Extremities: No cyanosis; lower extremity edema present  Data Reviewed: I have personally reviewed following labs and imaging studies  CBC: Recent Labs  Lab 01/08/19 1208 01/08/19 2332 01/09/19 0602 01/10/19 0346  01/11/19 0302  WBC 8.0 6.8 6.5 7.9 8.8  NEUTROABS 6.7  --   --  6.0 6.9  HGB 5.6* 6.5* 7.5* 7.6* 7.6*  HCT 22.9* 24.2* 27.4* 27.7* 27.7*  MCV 76.8* 75.2* 76.5* 77.8* 78.9*  PLT 339 315 339 319 317   Basic Metabolic Panel: Recent Labs  Lab 01/08/19 1208 01/09/19 0602 01/10/19 0346 01/11/19 0302  NA 142 140 141 141  K 4.3 4.1 3.6 3.5  CL 103 100 99 98  CO2 28 32 33* 36*  GLUCOSE 103* 99 103* 105*  BUN 8 8 9 9   CREATININE 1.14* 1.06* 1.12* 0.99  CALCIUM 10.9* 10.9* 11.0* 11.3*  MG  --   --  2.1 2.1   GFR: Estimated Creatinine Clearance: 88.4 mL/min (by C-G formula based on SCr of 0.99 mg/dL). Liver Function Tests: Recent Labs  Lab 01/08/19 1208  AST 14*  ALT 10  ALKPHOS 108  BILITOT 0.4  PROT 6.9  ALBUMIN 3.2*   No results for input(s): LIPASE, AMYLASE in the last 168 hours. No results for input(s): AMMONIA in the last 168 hours. Coagulation Profile: Recent Labs  Lab 01/05/19 01/08/19 1208 01/09/19 0602 01/10/19 0346  INR 2.7 3.2* 2.5* 2.0*   Cardiac Enzymes: Recent Labs  Lab 01/08/19 1208  TROPONINI <0.03   BNP (last 3 results) No results for input(s): PROBNP in the last 8760 hours. HbA1C: No results for input(s): HGBA1C in the last 72 hours. CBG: Recent Labs  Lab 01/10/19 0755 01/10/19 1217 01/10/19 1733 01/10/19 2222 01/11/19 0740  GLUCAP 94 99 104* 128* 103*   Lipid Profile: No results for input(s): CHOL, HDL, LDLCALC, TRIG, CHOLHDL, LDLDIRECT in the last 72 hours. Thyroid Function Tests: Recent Labs    01/08/19 1208 01/08/19 1402  TSH 2.667  --   FREET4  --  1.32   Anemia Panel: Recent Labs    01/08/19 1208  VITAMINB12 120*  FOLATE 10.4  FERRITIN 4*  TIBC 480*  IRON 13*  RETICCTPCT 1.9   Sepsis Labs: No results for input(s): PROCALCITON, LATICACIDVEN in the last 168 hours.  No results found for this or any previous visit (from the past 240 hour(s)).       Radiology Studies: No results found.      Scheduled  Meds: . albuterol  2.5 mg Nebulization TID  . atorvastatin  10 mg Oral QHS  . diltiazem  120 mg Oral Daily  . FLUoxetine  20 mg Oral Daily  . furosemide  80 mg Oral BID  . insulin aspart  0-15 Units Subcutaneous TID WC  . levothyroxine  175 mcg Oral Q0600  . loratadine  10 mg Oral Daily  . mometasone-formoterol  2 puff Inhalation BID  . omega-3 acid ethyl esters  1 g Oral BID  . pantoprazole  40 mg Oral Q0600  . sodium chloride flush  3 mL Intravenous Q12H   Continuous Infusions: . sodium chloride       LOS: 3 days        Glade LloydKshitiz Jeydi Klingel, MD [facility] 01/11/2019, 9:20 AM

## 2019-01-12 ENCOUNTER — Inpatient Hospital Stay (HOSPITAL_COMMUNITY): Payer: Medicare HMO | Admitting: Anesthesiology

## 2019-01-12 ENCOUNTER — Encounter (HOSPITAL_COMMUNITY)
Admission: EM | Disposition: A | Discharge status: Home Health | Payer: Self-pay | Source: Home / Self Care | Attending: Internal Medicine

## 2019-01-12 ENCOUNTER — Encounter (HOSPITAL_COMMUNITY): Payer: Self-pay

## 2019-01-12 DIAGNOSIS — K3189 Other diseases of stomach and duodenum: Secondary | ICD-10-CM

## 2019-01-12 DIAGNOSIS — K449 Diaphragmatic hernia without obstruction or gangrene: Secondary | ICD-10-CM

## 2019-01-12 DIAGNOSIS — E662 Morbid (severe) obesity with alveolar hypoventilation: Secondary | ICD-10-CM

## 2019-01-12 DIAGNOSIS — K648 Other hemorrhoids: Secondary | ICD-10-CM

## 2019-01-12 DIAGNOSIS — K5730 Diverticulosis of large intestine without perforation or abscess without bleeding: Secondary | ICD-10-CM

## 2019-01-12 HISTORY — PX: ESOPHAGOGASTRODUODENOSCOPY (EGD) WITH PROPOFOL: SHX5813

## 2019-01-12 HISTORY — PX: COLONOSCOPY WITH PROPOFOL: SHX5780

## 2019-01-12 HISTORY — PX: BIOPSY: SHX5522

## 2019-01-12 LAB — BASIC METABOLIC PANEL
Anion gap: 10 (ref 5–15)
BUN: 8 mg/dL (ref 8–23)
CO2: 32 mmol/L (ref 22–32)
Calcium: 11.1 mg/dL — ABNORMAL HIGH (ref 8.9–10.3)
Chloride: 99 mmol/L (ref 98–111)
Creatinine, Ser: 0.96 mg/dL (ref 0.44–1.00)
GFR calc Af Amer: >60 mL/min (ref >60)
GFR calc non Af Amer: >60 mL/min (ref >60)
Glucose, Bld: 120 mg/dL — ABNORMAL HIGH (ref 70–99)
Potassium: 4 mmol/L (ref 3.5–5.1)
Sodium: 141 mmol/L (ref 135–145)

## 2019-01-12 LAB — CBC WITH DIFFERENTIAL/PLATELET
Abs Immature Granulocytes: 0.03 10*3/uL (ref 0.00–0.07)
Basophils Absolute: 0 10*3/uL (ref 0.0–0.1)
Basophils Relative: 1 %
Eosinophils Absolute: 0.4 10*3/uL (ref 0.0–0.5)
Eosinophils Relative: 5 %
HCT: 29.6 % — ABNORMAL LOW (ref 36.0–46.0)
Hemoglobin: 8.1 g/dL — ABNORMAL LOW (ref 12.0–15.0)
Immature Granulocytes: 0 %
Lymphocytes Relative: 9 %
Lymphs Abs: 0.7 10*3/uL (ref 0.7–4.0)
MCH: 21.8 pg — ABNORMAL LOW (ref 26.0–34.0)
MCHC: 27.4 g/dL — ABNORMAL LOW (ref 30.0–36.0)
MCV: 79.6 fL — ABNORMAL LOW (ref 80.0–100.0)
Monocytes Absolute: 0.7 10*3/uL (ref 0.1–1.0)
Monocytes Relative: 9 %
Neutro Abs: 6 10*3/uL (ref 1.7–7.7)
Neutrophils Relative %: 76 %
Platelets: 331 10*3/uL (ref 150–400)
RBC: 3.72 MIL/uL — ABNORMAL LOW (ref 3.87–5.11)
RDW: 22.2 % — ABNORMAL HIGH (ref 11.5–15.5)
WBC: 7.8 10*3/uL (ref 4.0–10.5)
nRBC: 0 % (ref 0.0–0.2)

## 2019-01-12 LAB — GLUCOSE, CAPILLARY
Glucose-Capillary: 117 mg/dL — ABNORMAL HIGH (ref 70–99)
Glucose-Capillary: 131 mg/dL — ABNORMAL HIGH (ref 70–99)
Glucose-Capillary: 160 mg/dL — ABNORMAL HIGH (ref 70–99)
Glucose-Capillary: 125 mg/dL — ABNORMAL HIGH (ref 70–99)

## 2019-01-12 LAB — PROTIME-INR
INR: 1.5 — ABNORMAL HIGH (ref 0.8–1.2)
Prothrombin Time: 17.9 seconds — ABNORMAL HIGH (ref 11.4–15.2)

## 2019-01-12 SURGERY — COLONOSCOPY WITH PROPOFOL
Anesthesia: General

## 2019-01-12 MED ORDER — SUCCINYLCHOLINE CHLORIDE 200 MG/10ML IV SOSY
PREFILLED_SYRINGE | INTRAVENOUS | Status: DC | PRN
  Administered 2019-01-12: 120 mg via INTRAVENOUS

## 2019-01-12 MED ORDER — PROPOFOL 10 MG/ML IV BOLUS
INTRAVENOUS | Status: DC | PRN
  Administered 2019-01-12: 150 mg via INTRAVENOUS

## 2019-01-12 MED ORDER — FENTANYL CITRATE (PF) 100 MCG/2ML IJ SOLN
INTRAMUSCULAR | Status: AC
  Filled 2019-01-12: qty 2

## 2019-01-12 MED ORDER — WARFARIN SODIUM 5 MG PO TABS
5.0000 mg | ORAL_TABLET | Freq: Once | ORAL | Status: AC
  Administered 2019-01-12: 5 mg via ORAL
  Filled 2019-01-12: qty 1

## 2019-01-12 MED ORDER — DEXAMETHASONE SODIUM PHOSPHATE 10 MG/ML IJ SOLN
INTRAMUSCULAR | Status: DC | PRN
  Administered 2019-01-12: 5 mg via INTRAVENOUS

## 2019-01-12 MED ORDER — LACTATED RINGERS IV SOLN
INTRAVENOUS | Status: DC
  Administered 2019-01-12: 10:00:00 via INTRAVENOUS

## 2019-01-12 MED ORDER — EPHEDRINE SULFATE-NACL 50-0.9 MG/10ML-% IV SOSY
PREFILLED_SYRINGE | INTRAVENOUS | Status: DC | PRN
  Administered 2019-01-12 (×3): 10 mg via INTRAVENOUS

## 2019-01-12 MED ORDER — ONDANSETRON HCL 4 MG/2ML IJ SOLN
INTRAMUSCULAR | Status: DC | PRN
  Administered 2019-01-12: 4 mg via INTRAVENOUS

## 2019-01-12 MED ORDER — FENTANYL CITRATE (PF) 250 MCG/5ML IJ SOLN
INTRAMUSCULAR | Status: DC | PRN
  Administered 2019-01-12: 50 ug via INTRAVENOUS

## 2019-01-12 MED ORDER — LIDOCAINE 2% (20 MG/ML) 5 ML SYRINGE
INTRAMUSCULAR | Status: DC | PRN
  Administered 2019-01-12: 60 mg via INTRAVENOUS

## 2019-01-12 MED ORDER — SODIUM CHLORIDE 0.9 % IV SOLN
510.0000 mg | Freq: Once | INTRAVENOUS | Status: AC
  Administered 2019-01-12: 510 mg via INTRAVENOUS
  Filled 2019-01-12: qty 17

## 2019-01-12 MED ORDER — PHENYLEPHRINE 40 MCG/ML (10ML) SYRINGE FOR IV PUSH (FOR BLOOD PRESSURE SUPPORT)
PREFILLED_SYRINGE | INTRAVENOUS | Status: DC | PRN
  Administered 2019-01-12: 120 ug via INTRAVENOUS
  Administered 2019-01-12: 160 ug via INTRAVENOUS

## 2019-01-12 MED ORDER — WARFARIN - PHARMACIST DOSING INPATIENT
Freq: Every day | Status: DC
  Administered 2019-01-12: 18:00:00

## 2019-01-12 SURGICAL SUPPLY — 25 items

## 2019-01-12 NOTE — Anesthesia Preprocedure Evaluation (Addendum)
Anesthesia Evaluation  Patient identified by MRN, date of birth, ID band Patient awake    Reviewed: Allergy & Precautions, H&P , NPO status , Patient's Chart, lab work & pertinent test results  Airway Mallampati: III  TM Distance: >3 FB Neck ROM: Full    Dental no notable dental hx. (+) Teeth Intact, Dental Advisory Given   Pulmonary sleep apnea and Continuous Positive Airway Pressure Ventilation , COPD,  COPD inhaler, former smoker,    Pulmonary exam normal breath sounds clear to auscultation       Cardiovascular +CHF   Rhythm:Regular Rate:Normal     Neuro/Psych Depression negative neurological ROS     GI/Hepatic Neg liver ROS, GERD  ,  Endo/Other  diabetes, Type 2, Oral Hypoglycemic AgentsHypothyroidism Morbid obesity  Renal/GU negative Renal ROS  negative genitourinary   Musculoskeletal  (+) Arthritis , Osteoarthritis,    Abdominal   Peds  Hematology  (+) Blood dyscrasia, anemia ,   Anesthesia Other Findings   Reproductive/Obstetrics negative OB ROS                            Anesthesia Physical Anesthesia Plan  ASA: III  Anesthesia Plan: General   Post-op Pain Management:    Induction: Intravenous  PONV Risk Score and Plan: 3 and Ondansetron and Dexamethasone  Airway Management Planned: Oral ETT  Additional Equipment:   Intra-op Plan:   Post-operative Plan: Extubation in OR  Informed Consent: I have reviewed the patients History and Physical, chart, labs and discussed the procedure including the risks, benefits and alternatives for the proposed anesthesia with the patient or authorized representative who has indicated his/her understanding and acceptance.     Dental advisory given  Plan Discussed with: CRNA  Anesthesia Plan Comments:         Anesthesia Quick Evaluation

## 2019-01-12 NOTE — Progress Notes (Signed)
OT Cancellation Note  Patient Details Name: AGNIESZKA SCIARRA MRN: [id number] DOB: 1956-09-13   Cancelled Treatment:    Reason Eval/Treat Not Completed: Patient at procedure or test/ unavailable(Endoscopy). Will follow.  Evern Bio 01/12/2019, 10:47 AM  Martie Round, OTR/L Acute Rehabilitation Services Pager: [phone] Office: [phone]

## 2019-01-12 NOTE — Progress Notes (Signed)
Focus of session on educating pt in energy conservation strategies and benefits of tub transfer bench. Pt declines need for 3 in 1.    01/12/19 1500  OT Visit Information  Last OT Received On 01/12/19  Assistance Needed +1  History of Present Illness Pt is a 63 y/o female with medical history significant of atrial fibrillation on Coumadin, obesity hypoventilation syndrome with CPAP at nighttime, chronic respiratory failure with chronic 4 L of nasal cannula use, morbid obesity, hypothyroidism, chronic pressure ulcer wounds on bilateral posterior thighs presented on 01/08/2019 with shortness of breath.  She was found to have hemoglobin of 5.6; INR was 2.2.  FOBT was negative.  GI was consulted. She was transfused 3 units of PRBC.  Precautions  Precautions Fall  Precaution Comments pt on 4L 02 at baseline  Pain Assessment  Pain Assessment No/denies pain  Cognition  Arousal/Alertness Awake/alert  Behavior During Therapy WFL for tasks assessed/performed  Overall Cognitive Status Within Functional Limits for tasks assessed  ADL  General ADL Comments Educated pt in energy conservation, reinforced with handout. Educated in benefits of tub transfer bench for home use.  Transfers  General transfer comment deferred OOB, PT to follow OT session  OT - End of Session  Equipment Utilized During Treatment Oxygen (4L)  Activity Tolerance Patient tolerated treatment well  Patient left in bed;with call bell/phone within reach  OT Assessment/Plan  OT Plan Discharge plan remains appropriate  OT Visit Diagnosis Unsteadiness on feet (R26.81);Other abnormalities of gait and mobility (R26.89);Muscle weakness (generalized) (M62.81)  OT Frequency (ACUTE ONLY) Min 2X/week  Follow Up Recommendations Home health OT;Supervision/Assistance - 24 hour  OT Equipment Tub/shower bench (bariatric)  AM-PAC OT "6 Clicks" Daily Activity Outcome Measure (Version 2)  Help from another person eating meals? 4  Help from another  person taking care of personal grooming? 3  Help from another person toileting, which includes using toliet, bedpan, or urinal? 2  Help from another person bathing (including washing, rinsing, drying)? 2  Help from another person to put on and taking off regular upper body clothing? 3  Help from another person to put on and taking off regular lower body clothing? 1  6 Click Score 15  OT Goal Progression  Progress towards OT goals Progressing toward goals  Acute Rehab OT Goals  Patient Stated Goal to go home  OT Goal Formulation With patient  Time For Goal Achievement 01/24/19  Potential to Achieve Goals Good  OT Time Calculation  OT Start Time (ACUTE ONLY) 1445  OT Stop Time (ACUTE ONLY) 1500  OT Time Calculation (min) 15 min  OT General Charges  $OT Visit 1 Visit  OT Treatments  $Self Care/Home Management  8-22 mins  Martie Round, OTR/L Acute Rehabilitation Services Pager: [phone] Office: [phone]

## 2019-01-12 NOTE — Interval H&P Note (Signed)
History and Physical Interval Note: For endo/colon today to evaluate profound IDA. Stools heme neg HIGHER THAN BASELINE RISK.The nature of the procedure, as well as the risks, benefits, and alternatives were carefully and thoroughly reviewed with the patient. Ample time for discussion and questions allowed. The patient understood, was satisfied, and agreed to proceed.  Warfarin on hold and INR 1.5   CBC Latest Ref Rng & Units 01/12/2019 01/11/2019 01/10/2019  WBC 4.0 - 10.5 K/uL 7.8 8.8 7.9  Hemoglobin 12.0 - 15.0 g/dL 8.1(L) 7.6(L) 7.6(L)  Hematocrit 36.0 - 46.0 % 29.6(L) 27.7(L) 27.7(L)  Platelets 150 - 400 K/uL 331 317 319   Lab Results  Component Value Date   INR 1.5 (H) 01/12/2019   INR 1.5 (H) 01/11/2019   INR 2.0 (H) 01/10/2019     01/12/2019 10:23 AM  Amy Bryant  has presented today for surgery, with the diagnosis of anemia, heme positive stool.  The various methods of treatment have been discussed with the patient and family. After consideration of risks, benefits and other options for treatment, the patient has consented to  Procedure(s): COLONOSCOPY WITH PROPOFOL (N/A) ESOPHAGOGASTRODUODENOSCOPY (EGD) WITH PROPOFOL (N/A) as a surgical intervention.  The patient's history has been reviewed, patient examined, no change in status, stable for surgery.  I have reviewed the patient's chart and labs.  Questions were answered to the patient's satisfaction.     Carie Caddy [name]

## 2019-01-12 NOTE — Op Note (Signed)
[facility] Patient Name: Amy Bryant Procedure Date : 01/12/2019 MRN: [id number] Attending MD: Beverley Fiedler , MD Date of Birth: 1956/08/20 CSN: [id number] Age: 63 Admit Type: Inpatient Procedure:                Colonoscopy Indications:              Iron deficiency anemia Providers:                Carie Caddy. Rhea Belton, MD, Harold Barban, RN, Kandice Robinsons, Technician, Matthew Folks, Technician Referring MD:             [facility] Medicines:                General Anesthesia Complications:            No immediate complications. Estimated Blood Loss:     Estimated blood loss: none. Procedure:                Pre-Anesthesia Assessment:                           - Prior to the procedure, a History and Physical                            was performed, and patient medications and                            allergies were reviewed. The patient's tolerance of                            previous anesthesia was also reviewed. The risks                            and benefits of the procedure and the sedation                            options and risks were discussed with the patient.                            All questions were answered, and informed consent                            was obtained. Prior Anticoagulants: The patient has                            taken Coumadin (warfarin), last dose was 5 days                            prior to procedure. ASA Grade Assessment: III - A                            patient with severe systemic disease. After  reviewing the risks and benefits, the patient was                            deemed in satisfactory condition to undergo the                            procedure.                           After obtaining informed consent, the colonoscope                            was passed under direct vision. Throughout the                            procedure, the patient's  blood pressure, pulse, and                            oxygen saturations were monitored continuously. The                            CF-HQ190L ([id number] Olympus colonoscope was                            introduced through the anus and advanced to the                            terminal ileum. The colonoscopy was performed                            without difficulty. The patient tolerated the                            procedure well. The quality of the bowel                            preparation was good. The terminal ileum, ileocecal                            valve, appendiceal orifice, and rectum were                            photographed. Scope In: 10:55:53 AM Scope Out: 11:14:33 AM Scope Withdrawal Time: 0 hours 13 minutes 5 seconds  Total Procedure Duration: 0 hours 18 minutes 40 seconds  Findings:      The digital rectal exam was normal.      The terminal ileum appeared normal.      Multiple small and large-mouthed diverticula were found in the sigmoid       colon and descending colon.      Internal hemorrhoids were found during retroflexion. The hemorrhoids       were small. Impression:               - The examined portion of the ileum was normal.                           -  Diverticulosis in the sigmoid colon and in the                            descending colon.                           - Internal hemorrhoids.                           - No specimens collected. Moderate Sedation:      N/A Recommendation:           - Return patient to hospital ward for ongoing care.                           - Resume previous diet.                           - Continue present medications.                           - Okay to resume warfarin from GI perspective.                           - Repeat Feraheme (2nd dose today given last dose                            was 4 days ago).                           - Close attention to Hgb, iron studies as an                             outpatient and after iron replacement.                           - Given heme negative stools and no overt bleeding,                            I do not recommend video capsule endoscopy at this                            time. If recurrent IDA with heme + stools, then                            capsule endoscopy is the next recommended GI study.                           - Repeat colonoscopy in 10 years for screening                            purposes.                           - GI signing off, call with questions. Procedure Code(s):        --- Professional ---  50093, Colonoscopy, flexible; diagnostic, including                            collection of specimen(s) by brushing or washing,                            when performed (separate procedure) Diagnosis Code(s):        --- Professional ---                           K64.8, Other hemorrhoids                           D50.9, Iron deficiency anemia, unspecified                           K57.30, Diverticulosis of large intestine without                            perforation or abscess without bleeding CPT copyright 2019 American [employer] Association. All rights reserved. The codes documented in this report are preliminary and upon coder review may  be revised to meet current compliance requirements. Beverley Fiedler, MD 01/12/2019 11:27:42 AM This report has been signed electronically. Number of Addenda: 0

## 2019-01-12 NOTE — Transfer of Care (Signed)
Immediate Anesthesia Transfer of Care Note  Patient: Amy Bryant  Procedure(s) Performed: COLONOSCOPY WITH PROPOFOL (N/A ) ESOPHAGOGASTRODUODENOSCOPY (EGD) WITH PROPOFOL (N/A ) BIOPSY  Patient Location: PACU  Anesthesia Type:General  Level of Consciousness: awake, alert  and oriented  Airway & Oxygen Therapy: Patient Spontanous Breathing and Patient connected to face mask oxygen  Post-op Assessment: Report given to RN and Post -op Vital signs reviewed and stable  Post vital signs: Reviewed and stable  Last Vitals:  Vitals Value Taken Time  BP 129/59 01/12/2019 11:25 AM  Temp 36.6 C 01/12/2019 11:25 AM  Pulse 81 01/12/2019 11:27 AM  Resp 17 01/12/2019 11:27 AM  SpO2 97 % 01/12/2019 11:27 AM  Vitals shown include unvalidated device data.  Last Pain:  Vitals:   01/12/19 1125  TempSrc: Axillary  PainSc: 0-No pain      Patients Stated Pain Goal: 2 (01/11/19 1834)  Complications: No apparent anesthesia complications

## 2019-01-12 NOTE — Progress Notes (Signed)
ANTICOAGULATION CONSULT NOTE - Initial Consult  Pharmacy Consult for warfarin Indication: atrial fibrillation and pulmonary embolus  Allergies  Allergen Reactions  . Pseudoephedrine Other (See Comments)    REACTION: tightness in chest  . Augmentin [Amoxicillin-Pot Clavulanate] Itching and Rash    Did it involve swelling of the face/tongue/throat, SOB, or low BP? No Did it involve sudden or severe rash/hives, skin peeling, or any reaction on the inside of your mouth or nose? Yes Did you need to seek medical attention at a hospital or doctor's office? Yes When did it last happen?2019 If all above answers are "NO", may proceed with cephalosporin use.   . Latex Itching and Rash    Patient Measurements: Height: 5\' 4"  (162.6 cm) Weight: (!) 343 lb 14.7 oz (156 kg) IBW/kg (Calculated) : 54.7  Vital Signs: Temp: 97.8 F (36.6 C) (04/13 1125) Temp Source: Axillary (04/13 1125) BP: 126/50 (04/13 1140) Pulse Rate: 77 (04/13 1140)  Labs: Recent Labs    01/10/19 0346 01/11/19 0302 01/11/19 0846 01/12/19 0414  HGB 7.6* 7.6*  --  8.1*  HCT 27.7* 27.7*  --  29.6*  PLT 319 317  --  331  LABPROT 22.4*  --  18.1* 17.9*  INR 2.0*  --  1.5* 1.5*  CREATININE 1.12* 0.99  --  0.96    Estimated Creatinine Clearance: 90.1 mL/min (by C-G formula based on SCr of 0.96 mg/dL).   Medical History: Past Medical History:  Diagnosis Date  . Atrial fibrillation (HCC)    with cardioversion success  . CHF (congestive heart failure) (HCC)   . COPD (chronic obstructive pulmonary disease) (HCC)   . Cor pulmonale (HCC)    and obesity hypoventilation syndrome  . Diabetes mellitus without complication (HCC)   . GERD (gastroesophageal reflux disease)   . Hypothyroid   . Morbid obesity (HCC)   . OA (osteoarthritis)   . Pulmonary embolism (HCC)    hx of on coumadin  . Reactive airways dysfunction syndrome (HCC)     Assessment: 88 yof presented to the ED with SOB. She is on warfarin for  history of afib and PE. Found to be anemia with Hgb of 5.6. INR was also supratherapeutic at 3.2.   Patient now to be restarted on warfarin after being cleared by GI. INR 1.5 today. Hgb 8.1  Goal of Therapy:  INR 2-3 Monitor platelets by anticoagulation protocol: Yes   Plan:  Warfarin 5mg  PO x 1 Daily INR --Repeat dose of feraheme 510mg  IV x 1 today to complete course  Rylan Bernard A. Jeanella Craze, PharmD, BCPS Clinical Pharmacist Page Please utilize Amion for appropriate phone number to reach the unit pharmacist [facility])    01/12/2019,12:12 PM

## 2019-01-12 NOTE — Op Note (Signed)
[facility] Patient Name: Amy Bryant Procedure Date : 01/12/2019 MRN: [id number] Attending MD: Beverley Fiedler , MD Date of Birth: 22-Aug-1956 CSN: [id number] Age: 63 Admit Type: Inpatient Procedure:                Upper GI endoscopy Indications:              Iron deficiency anemia Providers:                Carie Caddy. Rhea Belton, MD, Harold Barban, RN, Kandice Robinsons, Technician, Matthew Folks, Technician Referring MD:             [facility] Medicines:                General Anesthesia Complications:            No immediate complications. Estimated Blood Loss:     Estimated blood loss was minimal. Procedure:                Pre-Anesthesia Assessment:                           - Prior to the procedure, a History and Physical                            was performed, and patient medications and                            allergies were reviewed. The patient's tolerance of                            previous anesthesia was also reviewed. The risks                            and benefits of the procedure and the sedation                            options and risks were discussed with the patient.                            All questions were answered, and informed consent                            was obtained. Prior Anticoagulants: The patient has                            taken Coumadin (warfarin), last dose was 5 days                            prior to procedure. ASA Grade Assessment: III - A                            patient with severe systemic disease. After  reviewing the risks and benefits, the patient was                            deemed in satisfactory condition to undergo the                            procedure.                           After obtaining informed consent, the endoscope was                            passed under direct vision. Throughout the                            procedure, the  patient's blood pressure, pulse, and                            oxygen saturations were monitored continuously. The                            [id number]-H190 ([id number]) Olympus gastroscope was                            introduced through the mouth, and advanced to the                            second part of duodenum. The upper GI endoscopy was                            accomplished without difficulty. The patient                            tolerated the procedure well. Scope In: Scope Out: Findings:      The examined esophagus was normal.      A 1 cm hiatal hernia was present.      The entire examined stomach was normal. Biopsies were taken with a cold       forceps for histology and Helicobacter pylori testing (body, antrum,       incisura).      The examined duodenum was normal. Biopsies for histology were taken with       a cold forceps for evaluation of celiac disease. Impression:               - Normal esophagus.                           - 1 cm hiatal hernia.                           - Normal stomach. Biopsied.                           - Normal examined duodenum. Biopsied. Moderate Sedation:      N/A Recommendation:           - Patient has a contact  number available for                            emergencies. The signs and symptoms of potential                            delayed complications were discussed with the                            patient. Return to normal activities tomorrow.                            Written discharge instructions were provided to the                            patient.                           - Resume previous diet.                           - Continue present medications.                           - Repeat Feraheme IV today (1st dose 4 days ago).                           - Await pathology results.                           - Will need close attention to Hgb and iron studies                            after IV iron replacement.                            - See the other procedure note for documentation of                            additional recommendations. Procedure Code(s):        --- Professional ---                           [phone], Esophagogastroduodenoscopy, flexible,                            transoral; with biopsy, single or multiple Diagnosis Code(s):        --- Professional ---                           K44.9, Diaphragmatic hernia without obstruction or                            gangrene                           D50.9, Iron deficiency anemia, unspecified  CPT copyright 2019 American Medical Association. All rights reserved. The codes documented in this report are preliminary and upon coder review may  be revised to meet current compliance requirements. Beverley Fiedler, MD 01/12/2019 11:23:19 AM This report has been signed electronically. Number of Addenda: 0

## 2019-01-12 NOTE — Progress Notes (Signed)
Physical Therapy Treatment Patient Details Name: Amy ShellingDeborah G Bryant MRN: [id number] DOB: 1956/07/09 Today's Date: 01/12/2019    History of Present Illness Pt is a 63 y/o female with medical history significant of atrial fibrillation on Coumadin, obesity hypoventilation syndrome with CPAP at nighttime, chronic respiratory failure with chronic 4 L of nasal cannula use, morbid obesity, hypothyroidism, chronic pressure ulcer wounds on bilateral posterior thighs presented on 01/08/2019 with shortness of breath.  She was found to have hemoglobin of 5.6; INR was 2.2.  FOBT was negative.  GI was consulted. She was transfused 3 units of PRBC.    PT Comments    Pt making good progress. Approaching her baseline. Ready for return home from PT standpoint.   Follow Up Recommendations  Home health PT;Supervision for mobility/OOB     Equipment Recommendations  3in1 (PT);Other (comment)(bari 3-in-1)    Recommendations for Other Services       Precautions / Restrictions Precautions Precautions: Fall Precaution Comments: pt on 4L 02 at baseline Restrictions Weight Bearing Restrictions: No    Mobility  Bed Mobility Overal bed mobility: Needs Assistance Bed Mobility: Supine to Sit;Sit to Supine     Supine to sit: Supervision;HOB elevated Sit to supine: Min assist   General bed mobility comments: Assist to bring legs back up into bed  Transfers Overall transfer level: Needs assistance Equipment used: Rolling walker (2 wheeled) Transfers: Sit to/from UGI CorporationStand;Stand Pivot Transfers Sit to Stand: Min guard Stand pivot transfers: Min guard       General transfer comment: Assist for safety. Bed to bsc with walker and stand pivot  Ambulation/Gait Ambulation/Gait assistance: Min guard Gait Distance (Feet): 5 Feet(forward and backward) Assistive device: 4-wheeled walker Gait Pattern/deviations: Step-through pattern;Decreased step length - right;Decreased step length - left;Trunk flexed;Wide base of  support Gait velocity: decr Gait velocity interpretation: <1.31 ft/sec, indicative of household ambulator General Gait Details: Assist for safety   Stairs             Wheelchair Mobility    Modified Rankin (Stroke Patients Only)       Balance Overall balance assessment: Needs assistance Sitting-balance support: No upper extremity supported;Feet supported Sitting balance-Leahy Scale: Good     Standing balance support: Bilateral upper extremity supported Standing balance-Leahy Scale: Poor Standing balance comment: walker and supervision for static standing                            Cognition Arousal/Alertness: Awake/alert Behavior During Therapy: WFL for tasks assessed/performed Overall Cognitive Status: Within Functional Limits for tasks assessed                                        Exercises      General Comments        Pertinent Vitals/Pain Pain Assessment: No/denies pain    Home Living                      Prior Function            PT Goals (current goals can now be found in the care plan section) Acute Rehab PT Goals Patient Stated Goal: to go home Progress towards PT goals: Progressing toward goals    Frequency    Min 3X/week      PT Plan Current plan remains appropriate    Co-evaluation  AM-PAC PT "6 Clicks" Mobility   Outcome Measure  Help needed turning from your back to your side while in a flat bed without using bedrails?: A Little Help needed moving from lying on your back to sitting on the side of a flat bed without using bedrails?: A Little Help needed moving to and from a bed to a chair (including a wheelchair)?: A Little Help needed standing up from a chair using your arms (e.g., wheelchair or bedside chair)?: A Little Help needed to walk in hospital room?: A Little Help needed climbing 3-5 steps with a railing? : Total 6 Click Score: 16    End of Session  Equipment Utilized During Treatment: Oxygen(4 L of O2 via [place]) Activity Tolerance: Patient tolerated treatment well Patient left: in bed;with call bell/phone within reach Nurse Communication: Mobility status PT Visit Diagnosis: Unsteadiness on feet (R26.81)     Time: 2878-6767 PT Time Calculation (min) (ACUTE ONLY): 25 min  Charges:  $Gait Training: 23-37 mins                     [facility] PT Acute Rehabilitation Services Pager [phone] Office [phone]    Angelina Ok [facility] 01/12/2019, 3:54 PM

## 2019-01-12 NOTE — Progress Notes (Signed)
Patient ID: Amy Bryant, female   DOB: Jun 21, 1956, 63 y.o.   MRN: [id number]  PROGRESS NOTE    Amy Bryant  TEL:[id number] DOB: [birth year] DOA: 01/08/2019 PCP: Abner Greenspan, MD   Brief Narrative:  63 y.o. female with medical history significant of atrial fibrillation on Coumadin, obesity hypoventilation syndrome with CPAP at nighttime, chronic respiratory failure with chronic 4 L of nasal cannula use, morbid obesity, hypothyroidism, chronic pressure ulcer wounds on bilateral posterior thighs presented on 01/08/2019 with shortness of breath.  She was found to have hemoglobin of 5.6; INR was 2.2.  FOBT was negative.  GI was consulted.  She was transfused 3 units of packed red cells.  Assessment & Plan:   Principal Problem:   Absolute anemia Active Problems:   Hypothyroidism   Diabetes type 2, controlled (HCC)   Morbid obesity (HCC)   Obesity hypoventilation syndrome (HCC)   Atrial fibrillation (HCC)   OSA (obstructive sleep apnea)   Coagulopathy (HCC)   Chronic anticoagulation   Symptomatic anemia   Symptomatic anemia/iron deficiency anemia -Presented with hemoglobin of 5.6.  FOBT was negative.  Patient has not had a colonoscopy yet. -Status post 2 units packed red cell transfusion.  Hemoglobin is 8.1 today. -GI following: Plan for EGD and colonoscopy today.  Continue oral Protonix.  Follow further GI recommendations.  Coumadin on hold.  Paroxysmal atrial fibrillation  -Coumadin has been on hold because of above.  INR was supratherapeutic on presentation.  INR 1.5 today.  Outpatient follow-up with PCP/cardiology. Continue Diltiazem.  Rate controlled.  Will resume Coumadin after procedure today if okay with GI.  Repeat AM INR.  Chronic diastolic heart failure -Strict input and output.  Daily weights.  Continue oral Lasix.  Echo shows EF of 60 to 65%.  Will need outpatient cardiology evaluation.  Diabetes mellitus type 2 -Glipizide and metformin on hold.  Continue CBGs with  sliding scale insulin  Chronic hypoxic respiratory failure -Questionable cause.  Uses oxygen via nasal cannula at 4 L/min at baseline  Morbid obesity  -outpatient follow-up  OSA-CPAP nightly  Hyperlipidemia -Continue Zocor  Hypothyroidism -Continue Synthroid  Depression -Continue Prozac  Chronic bilateral posterior thigh wounds -Wound care evaluation appreciated.  Wound care as per her recommendations.  These are not pressure ulcers.     DVT prophylaxis: SCDs.  Coumadin on hold Code Status: Full Family Communication: None at bedside Disposition Plan: Home once cleared by GI  Consultants: GI  Procedures: None  Antimicrobials: None   Subjective: Patient seen and examined at bedside.  She denies any worsening shortness of breath, fever, nausea, vomiting, black or bloody stools.    Objective: Vitals:   01/11/19 2116 01/12/19 0542 01/12/19 0823 01/12/19 0943  BP: (!) 109/49 (!) 120/54  (!) 130/52  Pulse: 67 68  72  Resp: 15 18  20   Temp: 99.6 F (37.6 C) 98.1 F (36.7 C)  98.3 F (36.8 C)  TempSrc:    Oral  SpO2: 94% 95% 94% 97%  Weight:    (!) 156 kg  Height:    5' 4"  (1.626 m)    Intake/Output Summary (Last 24 hours) at 01/12/2019 1100 Last data filed at 01/11/2019 1659 Gross per 24 hour  Intake 0 ml  Output -  Net 0 ml   Filed Weights   01/10/19 0540 01/11/19 0604 01/12/19 0943  Weight: (!) 163 kg (!) 158.6 kg (!) 156 kg    Examination:  General exam: Appears calm and comfortable.  Looks older  than stated age.  Poor historian.  No distress.  Looks chronically ill. Respiratory system: Bilateral decreased breath sounds at bases with scattered basilar crackles.  No wheezing Cardiovascular system: S1-S2 heard, rate controlled Gastrointestinal system: Abdomen is morbidly obese nondistended, soft and nontender. Normal bowel sounds heard. Extremities: No cyanosis; lower extremity edema present  Data Reviewed: I have personally reviewed following labs  and imaging studies  CBC: Recent Labs  Lab 01/08/19 1208 01/08/19 2332 01/09/19 0602 01/10/19 0346 01/11/19 0302 01/12/19 0414  WBC 8.0 6.8 6.5 7.9 8.8 7.8  NEUTROABS 6.7  --   --  6.0 6.9 6.0  HGB 5.6* 6.5* 7.5* 7.6* 7.6* 8.1*  HCT 22.9* 24.2* 27.4* 27.7* 27.7* 29.6*  MCV 76.8* 75.2* 76.5* 77.8* 78.9* 79.6*  PLT 339 315 339 319 317 224   Basic Metabolic Panel: Recent Labs  Lab 01/08/19 1208 01/09/19 0602 01/10/19 0346 01/11/19 0302 01/12/19 0414  NA 142 140 141 141 141  K 4.3 4.1 3.6 3.5 4.0  CL 103 100 99 98 99  CO2 28 32 33* 36* 32  GLUCOSE 103* 99 103* 105* 120*  BUN 8 8 9 9 8   CREATININE 1.14* 1.06* 1.12* 0.99 0.96  CALCIUM 10.9* 10.9* 11.0* 11.3* 11.1*  MG  --   --  2.1 2.1  --    GFR: Estimated Creatinine Clearance: 90.1 mL/min (by C-G formula based on SCr of 0.96 mg/dL). Liver Function Tests: Recent Labs  Lab 01/08/19 1208  AST 14*  ALT 10  ALKPHOS 108  BILITOT 0.4  PROT 6.9  ALBUMIN 3.2*   No results for input(s): LIPASE, AMYLASE in the last 168 hours. No results for input(s): AMMONIA in the last 168 hours. Coagulation Profile: Recent Labs  Lab 01/08/19 1208 01/09/19 0602 01/10/19 0346 01/11/19 0846 01/12/19 0414  INR 3.2* 2.5* 2.0* 1.5* 1.5*   Cardiac Enzymes: Recent Labs  Lab 01/08/19 1208  TROPONINI <0.03   BNP (last 3 results) No results for input(s): PROBNP in the last 8760 hours. HbA1C: No results for input(s): HGBA1C in the last 72 hours. CBG: Recent Labs  Lab 01/11/19 0740 01/11/19 1207 01/11/19 1624 01/11/19 2118 01/12/19 0751  GLUCAP 103* 108* 124* 117* 117*   Lipid Profile: No results for input(s): CHOL, HDL, LDLCALC, TRIG, CHOLHDL, LDLDIRECT in the last 72 hours. Thyroid Function Tests: No results for input(s): TSH, T4TOTAL, FREET4, T3FREE, THYROIDAB in the last 72 hours. Anemia Panel: No results for input(s): VITAMINB12, FOLATE, FERRITIN, TIBC, IRON, RETICCTPCT in the last 72 hours. Sepsis Labs: No results  for input(s): PROCALCITON, LATICACIDVEN in the last 168 hours.  No results found for this or any previous visit (from the past 240 hour(s)).       Radiology Studies: No results found.      Scheduled Meds: . [MAR Hold] albuterol  2.5 mg Nebulization TID  . [MAR Hold] atorvastatin  10 mg Oral QHS  . [MAR Hold] diltiazem  120 mg Oral Daily  . [MAR Hold] FLUoxetine  20 mg Oral Daily  . [MAR Hold] furosemide  80 mg Oral BID  . [MAR Hold] insulin aspart  0-15 Units Subcutaneous TID WC  . [MAR Hold] levothyroxine  175 mcg Oral Q0600  . [MAR Hold] loratadine  10 mg Oral Daily  . [MAR Hold] mometasone-formoterol  2 puff Inhalation BID  . [MAR Hold] omega-3 acid ethyl esters  1 g Oral BID  . [MAR Hold] pantoprazole  40 mg Oral Q0600  . [MAR Hold] peg 3350 powder  0.5 kit Oral Once  . [MAR Hold] sodium chloride flush  3 mL Intravenous Q12H   Continuous Infusions: . [MAR Hold] sodium chloride    . lactated ringers 10 mL/hr at 01/12/19 1002     LOS: 4 days        Aline August, MD [facility] 01/12/2019, 11:00 AM

## 2019-01-12 NOTE — Anesthesia Postprocedure Evaluation (Signed)
Anesthesia Post Note  Patient: VESTA DELASHMIT  Procedure(s) Performed: COLONOSCOPY WITH PROPOFOL (N/A ) ESOPHAGOGASTRODUODENOSCOPY (EGD) WITH PROPOFOL (N/A ) BIOPSY     Patient location during evaluation: PACU Anesthesia Type: General Level of consciousness: awake and alert Pain management: pain level controlled Vital Signs Assessment: post-procedure vital signs reviewed and stable Respiratory status: spontaneous breathing, nonlabored ventilation and respiratory function stable Cardiovascular status: blood pressure returned to baseline and stable Postop Assessment: no apparent nausea or vomiting Anesthetic complications: no    Last Vitals:  Vitals:   01/12/19 1243 01/12/19 1425  BP: 114/69   Pulse: 72   Resp:    Temp:    SpO2:  98%    Last Pain:  Vitals:   01/12/19 1140  TempSrc:   PainSc: 0-No pain                 Tamalyn Wadsworth,W. EDMOND

## 2019-01-12 NOTE — Anesthesia Procedure Notes (Signed)
Procedure Name: Intubation Date/Time: 01/12/2019 10:32 AM Performed by: Elliot Dally, CRNA Pre-anesthesia Checklist: Patient identified, Emergency Drugs available, Suction available and Patient being monitored Patient Re-evaluated:Patient Re-evaluated prior to induction Oxygen Delivery Method: Circle System Utilized Preoxygenation: Pre-oxygenation with 100% oxygen Induction Type: IV induction Ventilation: Mask ventilation without difficulty Laryngoscope Size: Glidescope and 3 Grade View: Grade I Tube type: Oral Tube size: 7.0 mm Number of attempts: 1 Airway Equipment and Method: Stylet and Oral airway Placement Confirmation: ETT inserted through vocal cords under direct vision,  positive ETCO2 and breath sounds checked- equal and bilateral Secured at: 22 cm Tube secured with: Tape Dental Injury: Teeth and Oropharynx as per pre-operative assessment

## 2019-01-13 LAB — CBC WITH DIFFERENTIAL/PLATELET
Abs Immature Granulocytes: 0 10*3/uL (ref 0.00–0.07)
Basophils Absolute: 0.1 10*3/uL (ref 0.0–0.1)
Basophils Relative: 1 %
Eosinophils Absolute: 0 10*3/uL (ref 0.0–0.5)
Eosinophils Relative: 0 %
HCT: 27 % — ABNORMAL LOW (ref 36.0–46.0)
Hemoglobin: 7.1 g/dL — ABNORMAL LOW (ref 12.0–15.0)
Lymphocytes Relative: 5 %
Lymphs Abs: 0.4 10*3/uL — ABNORMAL LOW (ref 0.7–4.0)
MCH: 21.4 pg — ABNORMAL LOW (ref 26.0–34.0)
MCHC: 26.3 g/dL — ABNORMAL LOW (ref 30.0–36.0)
MCV: 81.3 fL (ref 80.0–100.0)
Monocytes Absolute: 0.3 10*3/uL (ref 0.1–1.0)
Monocytes Relative: 4 %
Neutro Abs: 6.9 10*3/uL (ref 1.7–7.7)
Neutrophils Relative %: 90 %
Platelets: 285 10*3/uL (ref 150–400)
RBC: 3.32 MIL/uL — ABNORMAL LOW (ref 3.87–5.11)
RDW: 22.6 % — ABNORMAL HIGH (ref 11.5–15.5)
WBC: 7.7 10*3/uL (ref 4.0–10.5)
nRBC: 0 % (ref 0.0–0.2)
nRBC: 0 /100 WBC

## 2019-01-13 LAB — GLUCOSE, CAPILLARY
Glucose-Capillary: 106 mg/dL — ABNORMAL HIGH (ref 70–99)
Glucose-Capillary: 136 mg/dL — ABNORMAL HIGH (ref 70–99)
Glucose-Capillary: 103 mg/dL — ABNORMAL HIGH (ref 70–99)
Glucose-Capillary: 120 mg/dL — ABNORMAL HIGH (ref 70–99)

## 2019-01-13 LAB — BASIC METABOLIC PANEL
Anion gap: 8 (ref 5–15)
BUN: 10 mg/dL (ref 8–23)
CO2: 32 mmol/L (ref 22–32)
Calcium: 11.3 mg/dL — ABNORMAL HIGH (ref 8.9–10.3)
Chloride: 98 mmol/L (ref 98–111)
Creatinine, Ser: 0.98 mg/dL (ref 0.44–1.00)
GFR calc Af Amer: >60 mL/min (ref >60)
GFR calc non Af Amer: >60 mL/min (ref >60)
Glucose, Bld: 139 mg/dL — ABNORMAL HIGH (ref 70–99)
Potassium: 4 mmol/L (ref 3.5–5.1)
Sodium: 138 mmol/L (ref 135–145)

## 2019-01-13 LAB — PROTIME-INR
INR: 1.4 — ABNORMAL HIGH (ref 0.8–1.2)
Prothrombin Time: 17.3 seconds — ABNORMAL HIGH (ref 11.4–15.2)

## 2019-01-13 LAB — MAGNESIUM: Magnesium: 2 mg/dL (ref 1.7–2.4)

## 2019-01-13 MED ORDER — WARFARIN SODIUM 5 MG PO TABS
5.0000 mg | ORAL_TABLET | Freq: Once | ORAL | Status: AC
  Administered 2019-01-13: 5 mg via ORAL
  Filled 2019-01-13: qty 1

## 2019-01-13 MED ORDER — PHENOL 1.4 % MT LIQD
1.0000 | OROMUCOSAL | Status: DC | PRN
  Filled 2019-01-13: qty 177

## 2019-01-13 NOTE — Progress Notes (Signed)
Physical Therapy Treatment Patient Details Name: Amy Bryant MRN: [id number] DOB: 04/29/1956 Today's Date: 01/13/2019    History of Present Illness Pt is a 63 y/o female with medical history significant of atrial fibrillation on Coumadin, obesity hypoventilation syndrome with CPAP at nighttime, chronic respiratory failure with chronic 4 L of nasal cannula use, morbid obesity, hypothyroidism, chronic pressure ulcer wounds on bilateral posterior thighs presented on 01/08/2019 with shortness of breath.  She was found to have hemoglobin of 5.6; INR was 2.2.  FOBT was negative.  GI was consulted. She was transfused 3 units of PRBC.    PT Comments    Pt progressing towards physical therapy goals. Was able to perform gait training in room on 4L/min supplemental O2. Pt reports "This is the farthest I've walked since I've been here" and rushing to get back to the bed to sit down. Pt states she has a wheelchair available at home for longer distance, and continue to feel home health would be beneficial to improve overall tolerance for functional activity and return to PLOF. Will continue to follow.     Follow Up Recommendations  Home health PT;Supervision for mobility/OOB     Equipment Recommendations  3in1 (PT);Other (comment)(bari 3-in-1)    Recommendations for Other Services OT consult     Precautions / Restrictions Precautions Precautions: Fall Precaution Comments: pt on 4L 02 at baseline Restrictions Weight Bearing Restrictions: No    Mobility  Bed Mobility Overal bed mobility: Needs Assistance Bed Mobility: Supine to Sit;Sit to Supine     Supine to sit: Supervision;HOB elevated     General bed mobility comments: Pt was able to transition to EOB without assistance. Use of rails and increased time required.   Transfers Overall transfer level: Needs assistance Equipment used: Rolling walker (2 wheeled) Transfers: Sit to/from [employer] Sit to Stand: Min  guard Stand pivot transfers: Min guard       General transfer comment: Assist for safety. Bed to bsc with walker and stand pivot  Ambulation/Gait Ambulation/Gait assistance: Min guard Gait Distance (Feet): 20 Feet Assistive device: Rolling walker (2 wheeled) Gait Pattern/deviations: Step-through pattern;Decreased step length - right;Decreased step length - left;Trunk flexed;Wide base of support Gait velocity: Decreased Gait velocity interpretation: <1.8 ft/sec, indicate of risk for recurrent falls General Gait Details: In room only on 4L/min supplemental O2. Pt reports fatigue by end of gait training and rushing to sit down.    Stairs Stairs: (Deferred - has ramp)           Wheelchair Mobility    Modified Rankin (Stroke Patients Only)       Balance Overall balance assessment: Needs assistance Sitting-balance support: No upper extremity supported;Feet supported Sitting balance-Leahy Scale: Good     Standing balance support: Bilateral upper extremity supported Standing balance-Leahy Scale: Poor Standing balance comment: walker and supervision for static standing                            Cognition Arousal/Alertness: Awake/alert Behavior During Therapy: WFL for tasks assessed/performed Overall Cognitive Status: Within Functional Limits for tasks assessed                                        Exercises General Exercises - Lower Extremity Ankle Circles/Pumps: AROM;Both;20 reps;Supine Long Arc Quad: AROM;10 reps;Seated Heel Raises: Standing;10 reps    General Comments  Pertinent Vitals/Pain Pain Assessment: Faces Faces Pain Scale: Hurts little more Pain Location: bilateral LEs, L worse than R Pain Descriptors / Indicators: Aching Pain Intervention(s): Monitored during session    Home Living                      Prior Function            PT Goals (current goals can now be found in the care plan section)  Acute Rehab PT Goals Patient Stated Goal: to go home PT Goal Formulation: With patient Time For Goal Achievement: 01/23/19 Potential to Achieve Goals: Good Progress towards PT goals: Progressing toward goals    Frequency    Min 3X/week      PT Plan Current plan remains appropriate    Co-evaluation              AM-PAC PT "6 Clicks" Mobility   Outcome Measure  Help needed turning from your back to your side while in a flat bed without using bedrails?: A Little Help needed moving from lying on your back to sitting on the side of a flat bed without using bedrails?: A Little Help needed moving to and from a bed to a chair (including a wheelchair)?: A Little Help needed standing up from a chair using your arms (e.g., wheelchair or bedside chair)?: A Little Help needed to walk in hospital room?: A Little Help needed climbing 3-5 steps with a railing? : Total 6 Click Score: 16    End of Session Equipment Utilized During Treatment: Oxygen(4 L of O2 via [place]) Activity Tolerance: Patient tolerated treatment well Patient left: in bed;with call bell/phone within reach Nurse Communication: Mobility status PT Visit Diagnosis: Unsteadiness on feet (R26.81)     Time: [id number] PT Time Calculation (min) (ACUTE ONLY): 16 min  Charges:  $Gait Training: 8-22 mins                     Conni SlipperLaura Sansa Alkema, PT, DPT Acute Rehabilitation Services Pager: [phone] Office: [phone]    Marylynn PearsonLaura D Laurent Cargile 01/13/2019, 10:08 AM

## 2019-01-13 NOTE — Care Management Important Message (Signed)
Important Message  Patient Details  Name: Amy Bryant MRN: [id number] Date of Birth: 11-06-1955   Medicare Important Message Given:  Yes    Dorena Bodo 01/13/2019, 3:59 PM

## 2019-01-13 NOTE — Progress Notes (Signed)
ANTICOAGULATION CONSULT NOTE - Initial Consult  Pharmacy Consult for warfarin Indication: atrial fibrillation and pulmonary embolus  Allergies  Allergen Reactions  . Pseudoephedrine Other (See Comments)    REACTION: tightness in chest  . Augmentin [Amoxicillin-Pot Clavulanate] Itching and Rash    Did it involve swelling of the face/tongue/throat, SOB, or low BP? No Did it involve sudden or severe rash/hives, skin peeling, or any reaction on the inside of your mouth or nose? Yes Did you need to seek medical attention at a hospital or doctor's office? Yes When did it last happen?2019 If all above answers are "NO", may proceed with cephalosporin use.   . Latex Itching and Rash    Patient Measurements: Height: 5\' 4"  (162.6 cm) Weight: (!) 344 lb 5.7 oz (156.2 kg) IBW/kg (Calculated) : 54.7  Vital Signs: Temp: 98 F (36.7 C) (04/14 0508) Temp Source: Oral (04/14 0508) BP: 103/44 (04/14 0508) Pulse Rate: 75 (04/14 0508)  Labs: Recent Labs    01/11/19 0302 01/11/19 0846 01/12/19 0414 01/13/19 0415  HGB 7.6*  --  8.1* 7.1*  HCT 27.7*  --  29.6* 27.0*  PLT 317  --  331 285  LABPROT  --  18.1* 17.9* 17.3*  INR  --  1.5* 1.5* 1.4*  CREATININE 0.99  --  0.96 0.98    Estimated Creatinine Clearance: 88.4 mL/min (by C-G formula based on SCr of 0.98 mg/dL).   Medical History: Past Medical History:  Diagnosis Date  . Atrial fibrillation (HCC)    with cardioversion success  . CHF (congestive heart failure) (HCC)   . COPD (chronic obstructive pulmonary disease) (HCC)   . Cor pulmonale (HCC)    and obesity hypoventilation syndrome  . Diabetes mellitus without complication (HCC)   . GERD (gastroesophageal reflux disease)   . Hypothyroid   . Morbid obesity (HCC)   . OA (osteoarthritis)   . Pulmonary embolism (HCC)    hx of on coumadin  . Reactive airways dysfunction syndrome (HCC)     Assessment: 14 yof presented to the ED with SOB. She is on warfarin for history  of afib and PE. Found to be anemia with Hgb of 5.6. INR was also supratherapeutic at 3.2.   Patient now to be restarted on warfarin after being cleared by GI. INR 1.4 today. Hgb 8.1>>7.1 this AM. No bleeding noted  Goal of Therapy:  INR 2-3 Monitor platelets by anticoagulation protocol: Yes   Plan:  Warfarin 5mg  PO x 1 Daily INR --Repeat dose of feraheme 510mg  IV x 1 today to complete course  Kamarrion Stfort A. Jeanella Craze, PharmD, BCPS Clinical Pharmacist [place] Please utilize Amion for appropriate phone number to reach the unit pharmacist [facility])    01/13/2019,8:40 AM

## 2019-01-13 NOTE — Progress Notes (Signed)
Patient ID: MARIPAZ MULLAN, female   DOB: [birth year], 63 y.o.   MRN: [id number]  PROGRESS NOTE    KARIAH LOREDO  OFB:[id number] DOB: May 12, 1956 DOA: 01/08/2019 PCP: Abner Greenspan, MD   Brief Narrative:  63 y.o. female with medical history significant of atrial fibrillation on Coumadin, obesity hypoventilation syndrome with CPAP at nighttime, chronic respiratory failure with chronic 4 L of nasal cannula use, morbid obesity, hypothyroidism, chronic pressure ulcer wounds on bilateral posterior thighs presented on 01/08/2019 with shortness of breath.  She was found to have hemoglobin of 5.6; INR was 2.2.  FOBT was negative.  GI was consulted.  She was transfused 3 units of packed red cells.  Assessment & Plan:   Principal Problem:   Absolute anemia Active Problems:   Hypothyroidism   Diabetes type 2, controlled (HCC)   Morbid obesity (HCC)   Obesity hypoventilation syndrome (HCC)   Atrial fibrillation (HCC)   OSA (obstructive sleep apnea)   Coagulopathy (HCC)   Chronic anticoagulation   Symptomatic anemia   Symptomatic anemia/iron deficiency anemia -Presented with hemoglobin of 5.6.  FOBT was negative.  -Status post 2 units packed red cell transfusion.  Hemoglobin is 7.1 today.  Will repeat hemoglobin tomorrow and if it drops below 7, patient might need another transfusion.  No overnight black or bloody stools. -Status post EGD on 01/12/2019 which was normal.  Currently on oral Protonix. -Status post colonoscopy on 01/12/2019 which showed diverticulosis in the sigmoid and descending colon along with internal hemorrhoids.  GI has cleared the patient to be restarted on Coumadin.  Coumadin was restarted on 01/12/2019.  GI signed off.  Paroxysmal atrial fibrillation  -Coumadin plan as above.  INR is 1.4 today.  Repeat INR in a.m.   Outpatient follow-up with PCP/cardiology. Continue Diltiazem.  Rate controlled.    Chronic diastolic heart failure -Strict input and output.  Daily weights.  Continue  oral Lasix.  Echo shows EF of 60 to 65%.  Will need outpatient cardiology evaluation.  Diabetes mellitus type 2 -Glipizide and metformin on hold.  Continue CBGs with sliding scale insulin  Chronic hypoxic respiratory failure -Questionable cause.  Uses oxygen via nasal cannula at 4 L/min at baseline  Morbid obesity  -outpatient follow-up  OSA-CPAP nightly  Hyperlipidemia -Continue Zocor  Hypothyroidism -Continue Synthroid  Depression -Continue Prozac  Chronic bilateral posterior thigh wounds -Wound care evaluation appreciated.  Wound care as per her recommendations.  These are not pressure ulcers.     DVT prophylaxis: SCDs.  Coumadin resumed on 01/12/2019 Code Status: Full Family Communication: None at bedside Disposition Plan: Home tomorrow if hemoglobin remains stable  Consultants: GI  Procedures:  EGD on 01/12/2019 Impression:               - Normal esophagus.                           - 1 cm hiatal hernia.                           - Normal stomach. Biopsied.                           - Normal examined duodenum. Biopsied. Moderate Sedation:      N/A Recommendation:           - Patient has a contact number available for  emergencies. The signs and symptoms of potential                            delayed complications were discussed with the                            patient. Return to normal activities tomorrow.                            Written discharge instructions were provided to the                            patient.                           - Resume previous diet.                           - Continue present medications.                           - Repeat Feraheme IV today (1st dose 4 days ago).                           - Await pathology results.                           - Will need close attention to Hgb and iron studies                            after IV iron replacement.                           - See the other  procedure note for documentation of                            additional recommendations.  Colonoscopy on 01/12/2019 Findings:      The digital rectal exam was normal.      The terminal ileum appeared normal.      Multiple small and large-mouthed diverticula were found in the sigmoid       colon and descending colon.      Internal hemorrhoids were found during retroflexion. The hemorrhoids       were small. Impression:               - The examined portion of the ileum was normal.                           - Diverticulosis in the sigmoid colon and in the                            descending colon.                           - Internal hemorrhoids.                           -  No specimens collected. Moderate Sedation:      N/A Recommendation:           - Return patient to hospital ward for ongoing care.                           - Resume previous diet.                           - Continue present medications.                           - Okay to resume warfarin from GI perspective.                           - Repeat Feraheme (2nd dose today given last dose                            was 4 days ago).                           - Close attention to Hgb, iron studies as an                            outpatient and after iron replacement.                           - Given heme negative stools and no overt bleeding,                            I do not recommend video capsule endoscopy at this                            time. If recurrent IDA with heme + stools, then                            capsule endoscopy is the next recommended GI study.                           - Repeat colonoscopy in 10 years for screening                            purposes.                           - GI signing off, call with questions.  Antimicrobials: None   Subjective: Patient seen and examined at bedside.  She denies any overnight black or bloody stools.  No worsening shortness of breath, nausea or  vomiting.  Objective: Vitals:   01/12/19 2159 01/13/19 0508 01/13/19 0510 01/13/19 0842  BP: (!) 101/37 (!) 103/44    Pulse: 77 75    Resp: 18     Temp: 98.4 F (36.9 C) 98 F (36.7 C)    TempSrc:  Oral    SpO2: 93% 93%  95%  Weight:   (!) 156.2 kg   Height:  Intake/Output Summary (Last 24 hours) at 01/13/2019 0913 Last data filed at 01/12/2019 1728 Gross per 24 hour  Intake 1109.75 ml  Output -  Net 1109.75 ml   Filed Weights   01/11/19 0604 01/12/19 0943 01/13/19 0510  Weight: (!) 158.6 kg (!) 156 kg (!) 156.2 kg    Examination:  General exam: Looks older than stated age.  Poor historian.  No acute distress.  Looks chronically ill. Respiratory system: Bilateral decreased breath sounds at bases with scattered basilar crackles.   Cardiovascular system: S1-S2 heard, rate controlled Gastrointestinal system: Abdomen is morbidly obese nondistended, soft and nontender. Normal bowel sounds heard. Extremities: No cyanosis; lower extremity edema present  Data Reviewed: I have personally reviewed following labs and imaging studies  CBC: Recent Labs  Lab 01/08/19 1208  01/09/19 0602 01/10/19 0346 01/11/19 0302 01/12/19 0414 01/13/19 0415  WBC 8.0   < > 6.5 7.9 8.8 7.8 7.7  NEUTROABS 6.7  --   --  6.0 6.9 6.0 6.9  HGB 5.6*   < > 7.5* 7.6* 7.6* 8.1* 7.1*  HCT 22.9*   < > 27.4* 27.7* 27.7* 29.6* 27.0*  MCV 76.8*   < > 76.5* 77.8* 78.9* 79.6* 81.3  PLT 339   < > 339 319 317 331 285   < > = values in this interval not displayed.   Basic Metabolic Panel: Recent Labs  Lab 01/09/19 0602 01/10/19 0346 01/11/19 0302 01/12/19 0414 01/13/19 0415  NA 140 141 141 141 138  K 4.1 3.6 3.5 4.0 4.0  CL 100 99 98 99 98  CO2 32 33* 36* 32 32  GLUCOSE 99 103* 105* 120* 139*  BUN _0 CREATININE 1.06* 1.12* 0.99 0.96 0.98  CALCIUM 10.9* 11.0* 11.3* 11.1* 11.3*  MG  --  2.1 2.1  --  2.0   GFR: Estimated Creatinine Clearance: 88.4 mL/min (by C-G formula based on SCr  of 0.98 mg/dL). Liver Function Tests: Recent Labs  Lab 01/08/19 1208  AST 14*  ALT 10  ALKPHOS 108  BILITOT 0.4  PROT 6.9  ALBUMIN 3.2*   No results for input(s): LIPASE, AMYLASE in the last 168 hours. No results for input(s): AMMONIA in the last 168 hours. Coagulation Profile: Recent Labs  Lab 01/09/19 0602 01/10/19 0346 01/11/19 0846 01/12/19 0414 01/13/19 0415  INR 2.5* 2.0* 1.5* 1.5* 1.4*   Cardiac Enzymes: Recent Labs  Lab 01/08/19 1208  TROPONINI <0.03   BNP (last 3 results) No results for input(s): PROBNP in the last 8760 hours. HbA1C: No results for input(s): HGBA1C in the last 72 hours. CBG: Recent Labs  Lab 01/12/19 0751 01/12/19 1212 01/12/19 1652 01/12/19 2200 01/13/19 0805  GLUCAP 117* 131* 160* 125* 106*   Lipid Profile: No results for input(s): CHOL, HDL, LDLCALC, TRIG, CHOLHDL, LDLDIRECT in the last 72 hours. Thyroid Function Tests: No results for input(s): TSH, T4TOTAL, FREET4, T3FREE, THYROIDAB in the last 72 hours. Anemia Panel: No results for input(s): VITAMINB12, FOLATE, FERRITIN, TIBC, IRON, RETICCTPCT in the last 72 hours. Sepsis Labs: No results for input(s): PROCALCITON, LATICACIDVEN in the last 168 hours.  No results found for this or any previous visit (from the past 240 hour(s)).       Radiology Studies: No results found.      Scheduled Meds: . albuterol  2.5 mg Nebulization TID  . atorvastatin  10 mg Oral QHS  . diltiazem  120 mg Oral Daily  . FLUoxetine  20 mg Oral Daily  .  furosemide  80 mg Oral BID  . insulin aspart  0-15 Units Subcutaneous TID WC  . levothyroxine  175 mcg Oral Q0600  . loratadine  10 mg Oral Daily  . mometasone-formoterol  2 puff Inhalation BID  . omega-3 acid ethyl esters  1 g Oral BID  . pantoprazole  40 mg Oral Q0600  . peg 3350 powder  0.5 kit Oral Once  . sodium chloride flush  3 mL Intravenous Q12H  . warfarin  5 mg Oral ONCE-1800  . Warfarin - Pharmacist Dosing Inpatient   Does  not apply q1800   Continuous Infusions: . sodium chloride Stopped (01/12/19 1150)     LOS: 5 days        Aline August, MD [facility] 01/13/2019, 9:13 AM

## 2019-01-13 NOTE — Plan of Care (Signed)
  Problem: Health Behavior/Discharge Planning: Goal: Ability to manage health-related needs will improve Outcome: Progressing   Problem: Clinical Measurements: Goal: Will remain free from infection Outcome: Progressing   Problem: Clinical Measurements: Goal: Diagnostic test results will improve Outcome: Progressing   Problem: Activity: Goal: Risk for activity intolerance will decrease Outcome: Progressing   Problem: Nutrition: Goal: Adequate nutrition will be maintained Outcome: Progressing   Problem: Pain Managment: Goal: General experience of comfort will improve Outcome: Progressing   Problem: Elimination: Goal: Will not experience complications related to bowel motility Outcome: Progressing   Problem: Elimination: Goal: Will not experience complications related to urinary retention Outcome: Progressing

## 2019-01-13 NOTE — TOC Initial Note (Signed)
Transition of Care [facility](TOC) - Initial/Assessment Note    Patient Details  Name: Amy Bryant MRN: [id number] Date of Birth: 17-Sep-1956  Transition of Care [facility](TOC) CM/SW Contact:    Amy Bryant, Amy Rodriquez Hudson, RN Phone Number: 01/13/2019, 11:12 AM  Clinical Narrative:  Admitted with SOB/ anemia. Hx of atrial fibrillation on Coumadin, OSA, obesity hypoventilation syndrome, chronic respiratory failure with chronic use of 4 L [place] [employer], diabetes, GERD, morbid obesity, hypothyroidism.From home with spouse. PTA independent with  ADL's.DME: walker , W/C, oxygen. Active with [facility] (RN,PT,OT,SW, NA).Pt states family to provide transportation to home and bring portable oxygen tank for ride to home.   Amy CossRobert Bryant (Spouse)      [phone]      PCP: Roxy MannsMarne [name]  Expected Discharge Plan: Home w Home Health Services Barriers to Discharge: Continued Medical Work up  Pt will need resumption orders for home health services ( [facility]PT,OT, [facility] @ [facility]/c.  Patient Goals and CMS Choice Patient states their goals for this hospitalization and ongoing recovery are:: to feel better and do more walking CMS Medicare.gov Compare Post Acute Care list provided to:: Patient Choice offered to / list presented to : Patient  Expected Discharge Plan and Services Expected Discharge Plan: Home w Home Health Services In-house Referral: NA Discharge Planning Services: CM Consult Post Acute Care Choice: Home Health Living arrangements for the past 2 months: Single Family Home                 DME Arranged: N/A(owns walker, W/C) DME Agency: NA HH Arranged: PT, OT, RN, Nurse's Aide, Social Work [employer] Agency: [facility]  Prior Living Arrangements/Services Living arrangements for the past 2 months: Single Family Home Lives with:: Spouse Patient language and need for interpreter reviewed:: Yes Do you feel safe going back to the place where you live?: Yes      Need for Family Participation in Patient  Care: No (Comment) Care giver support system in place?: Yes (comment) Current home services: Home OT, Home PT, Home RN, Homehealth aide(Active with [facility] ( RN,PT,OT, SW, HHA)) Criminal Activity/Legal Involvement Pertinent to Current Situation/Hospitalization: No - Comment as needed  Activities of Daily Living Home Assistive Devices/Equipment: Environmental consultantWalker (specify type), Wheelchair ADL Screening (condition at time of admission) Patient's cognitive ability adequate to safely complete daily activities?: Yes Is the patient deaf or have difficulty hearing?: Yes Does the patient have difficulty seeing, even when wearing glasses/contacts?: No Does the patient have difficulty concentrating, remembering, or making decisions?: No Patient able to express need for assistance with ADLs?: Yes Does the patient have difficulty dressing or bathing?: No Independently performs ADLs?: No Communication: Independent Dressing (OT): Needs assistance Is this a change from baseline?: Pre-admission baseline Grooming: Needs assistance Is this a change from baseline?: Pre-admission baseline Feeding: Independent Bathing: Needs assistance Is this a change from baseline?: Pre-admission baseline Toileting: Needs assistance Is this a change from baseline?: Pre-admission baseline In/Out Bed: Needs assistance Is this a change from baseline?: Pre-admission baseline Walks in Home: Needs assistance Is this a change from baseline?: Pre-admission baseline Does the patient have difficulty walking or climbing stairs?: Yes Weakness of Legs: Both Weakness of Arms/Hands: Both  Permission Sought/Granted Permission sought to share information with : Case Manager, Family Supports Permission granted to share information with : Yes, Verbal Permission Granted  Share Information with NAME: Amy Cossobert Bryant (Spouse)           Emotional Assessment Appearance:: Appears stated age Attitude/Demeanor/Rapport: Engaged,  Gracious  Affect (typically observed): Accepting Orientation: : Oriented to Self, Oriented to Situation, Oriented to Place, Oriented to  Time Alcohol / Substance Use: Not Applicable Psych Involvement: No (comment)  Admission diagnosis:  Symptomatic anemia [D64.9] Patient Active Problem List   Diagnosis Date Noted  . Symptomatic anemia   . Coagulopathy (HCC)   . Chronic anticoagulation   . Absolute anemia 01/08/2019  . URI (upper respiratory infection) 01/01/2019  . Reactive depression (situational) 12/29/2018  . Mobility impaired 12/12/2018  . Hematuria 12/03/2017  . Pedal edema 12/23/2016  . OSA (obstructive sleep apnea) 12/21/2016  . Financial difficulties 09/16/2016  . Colon cancer screening 12/15/2013  . Encounter for therapeutic drug monitoring 11/05/2013  . Routine general medical examination at a health care facility 05/20/2012  . Atrial fibrillation (HCC) 11/23/2010  . History of pulmonary embolism 11/23/2010  . Vitamin D deficiency 11/15/2010  . DYSURIA, HX OF 08/19/2009  . PURE HYPERCHOLESTEROLEMIA 08/16/2009  . ATRIAL FIBRILLATION, HX OF 12/03/2008  . Diabetes type 2, controlled (HCC) 08/13/2008  . Hyperparathyroidism (HCC) 07/30/2008  . Hypothyroidism 02/13/2008  . Morbid obesity (HCC) 02/04/2008  . Reactive airway disease 02/04/2008  . GERD 02/04/2008  . DISC DISEASE, LUMBAR 02/04/2008  . PULMONARY EMBOLISM, HX OF 02/04/2008  . TOBACCO USE, QUIT 02/04/2008  . Obesity hypoventilation syndrome (HCC) 08/11/2007  . COR PULMONALE 08/11/2007   PCP:  Amy Pimple, MD Pharmacy:   [facility] [phone] [place], [place] - [address] [place] [address] Phone: [phone] Fax: [phone]     Social Determinants of Health (SDOH) Interventions    Readmission Risk Interventions No flowsheet data found.

## 2019-01-14 ENCOUNTER — Other Ambulatory Visit: Payer: Self-pay | Admitting: Family Medicine

## 2019-01-14 ENCOUNTER — Telehealth: Payer: Self-pay | Admitting: Family Medicine

## 2019-01-14 DIAGNOSIS — I4821 Permanent atrial fibrillation: Secondary | ICD-10-CM

## 2019-01-14 DIAGNOSIS — D509 Iron deficiency anemia, unspecified: Principal | ICD-10-CM

## 2019-01-14 DIAGNOSIS — L8990 Pressure ulcer of unspecified site, unspecified stage: Secondary | ICD-10-CM

## 2019-01-14 DIAGNOSIS — Z23 Encounter for immunization: Secondary | ICD-10-CM | POA: Diagnosis not present

## 2019-01-14 LAB — CBC WITH DIFFERENTIAL/PLATELET
Abs Immature Granulocytes: 0.05 10*3/uL (ref 0.00–0.07)
Basophils Absolute: 0 10*3/uL (ref 0.0–0.1)
Basophils Relative: 0 %
Eosinophils Absolute: 0.2 10*3/uL (ref 0.0–0.5)
Eosinophils Relative: 2 %
HCT: 27.3 % — ABNORMAL LOW (ref 36.0–46.0)
Hemoglobin: 7.5 g/dL — ABNORMAL LOW (ref 12.0–15.0)
Immature Granulocytes: 1 %
Lymphocytes Relative: 16 %
Lymphs Abs: 1.1 10*3/uL (ref 0.7–4.0)
MCH: 22.8 pg — ABNORMAL LOW (ref 26.0–34.0)
MCHC: 27.5 g/dL — ABNORMAL LOW (ref 30.0–36.0)
MCV: 83 fL (ref 80.0–100.0)
Monocytes Absolute: 0.5 10*3/uL (ref 0.1–1.0)
Monocytes Relative: 8 %
Neutro Abs: 5 10*3/uL (ref 1.7–7.7)
Neutrophils Relative %: 73 %
Platelets: 275 10*3/uL (ref 150–400)
RBC: 3.29 MIL/uL — ABNORMAL LOW (ref 3.87–5.11)
RDW: 23.8 % — ABNORMAL HIGH (ref 11.5–15.5)
WBC: 6.8 10*3/uL (ref 4.0–10.5)
nRBC: 0 % (ref 0.0–0.2)

## 2019-01-14 LAB — BASIC METABOLIC PANEL
Anion gap: 9 (ref 5–15)
BUN: 13 mg/dL (ref 8–23)
CO2: 34 mmol/L — ABNORMAL HIGH (ref 22–32)
Calcium: 11.1 mg/dL — ABNORMAL HIGH (ref 8.9–10.3)
Chloride: 97 mmol/L — ABNORMAL LOW (ref 98–111)
Creatinine, Ser: 0.96 mg/dL (ref 0.44–1.00)
GFR calc Af Amer: >60 mL/min (ref >60)
GFR calc non Af Amer: >60 mL/min (ref >60)
Glucose, Bld: 108 mg/dL — ABNORMAL HIGH (ref 70–99)
Potassium: 3.3 mmol/L — ABNORMAL LOW (ref 3.5–5.1)
Sodium: 140 mmol/L (ref 135–145)

## 2019-01-14 LAB — MAGNESIUM: Magnesium: 2 mg/dL (ref 1.7–2.4)

## 2019-01-14 LAB — GLUCOSE, CAPILLARY
Glucose-Capillary: 91 mg/dL (ref 70–99)
Glucose-Capillary: 91 mg/dL (ref 70–99)

## 2019-01-14 LAB — PROTIME-INR
INR: 1.4 — ABNORMAL HIGH (ref 0.8–1.2)
Prothrombin Time: 17.3 seconds — ABNORMAL HIGH (ref 11.4–15.2)

## 2019-01-14 MED ORDER — PANTOPRAZOLE SODIUM 40 MG PO TBEC: 40.0000 mg | DELAYED_RELEASE_TABLET | Freq: Every day | ORAL | 0 refills | Status: DC

## 2019-01-14 MED ORDER — POTASSIUM CHLORIDE CRYS ER 20 MEQ PO TBCR
40.0000 meq | EXTENDED_RELEASE_TABLET | Freq: Once | ORAL | Status: AC
  Administered 2019-01-14: 40 meq via ORAL
  Filled 2019-01-14: qty 2

## 2019-01-14 MED ORDER — POLYSACCHARIDE IRON COMPLEX 150 MG PO CAPS: 150.0000 mg | ORAL_CAPSULE | Freq: Every day | ORAL | 0 refills | Status: DC

## 2019-01-14 MED ORDER — WARFARIN SODIUM 5 MG PO TABS: 5.0000 mg | ORAL_TABLET | Freq: Every day | ORAL | 0 refills | Status: DC

## 2019-01-14 MED ORDER — ORAL CARE MOUTH RINSE
15.0000 mL | Freq: Two times a day (BID) | OROMUCOSAL | Status: DC
  Administered 2019-01-14: 15 mL via OROMUCOSAL

## 2019-01-14 MED ORDER — WARFARIN SODIUM 7.5 MG PO TABS: 7.5000 mg | ORAL_TABLET | Freq: Once | ORAL | Status: DC

## 2019-01-14 MED FILL — POLY-IRON 150 MG CAPSULE: 150 | 30 days supply | Qty: 30 | Fill #0

## 2019-01-14 MED FILL — PANTOPRAZOLE SOD DR 40 MG T: 40 | 30 days supply | Qty: 30 | Fill #0

## 2019-01-14 MED FILL — WARFARIN SODIUM 5 MG TABLET: 5 | 28 days supply | Qty: 30 | Fill #0

## 2019-01-14 NOTE — Telephone Encounter (Signed)
Spoke with [facility] nurse, and put in orders for stat INR check on patient for this Friday, 4/17. They will contact me directly in coumadin clinic with results.

## 2019-01-14 NOTE — Progress Notes (Signed)
Physical Therapy Treatment Patient Details Name: Amy Bryant MRN: [id number] DOB: [birth year] Today's Date: 01/14/2019    History of Present Illness Pt is a 63 y/o female with medical history significant of atrial fibrillation on Coumadin, obesity hypoventilation syndrome with CPAP at nighttime, chronic respiratory failure with chronic 4 L of nasal cannula use, morbid obesity, hypothyroidism, chronic pressure ulcer wounds on bilateral posterior thighs presented on 01/08/2019 with shortness of breath.  She was found to have hemoglobin of 5.6; INR was 2.2.  FOBT was negative.  GI was consulted. She was transfused 3 units of PRBC.    PT Comments    Pt progressing towards physical therapy goals. Pt reports she anticipates d/c home today. Pt was educated on safe car transfer, utilization of wheelchair to enter home and energy conservation techniques. Pt states she is looking forward to resuming HHPT services to return to PLOF. Will continue to follow.     Follow Up Recommendations  Home health PT;Supervision for mobility/OOB     Equipment Recommendations  3in1 (PT);Other (comment)(bari 3-in-1)    Recommendations for Other Services OT consult     Precautions / Restrictions Precautions Precautions: Fall Precaution Comments: pt on 4L 02 at baseline Restrictions Weight Bearing Restrictions: No    Mobility  Bed Mobility Overal bed mobility: Needs Assistance Bed Mobility: Supine to Sit;Sit to Supine     Supine to sit: Supervision;HOB elevated Sit to supine: Min assist   General bed mobility comments: Assist for LE elevation back up into bed at end of session. Bed mobility in general effortful.   Transfers Overall transfer level: Needs assistance Equipment used: Rolling walker (2 wheeled) Transfers: Sit to/from [employer] Sit to Stand: Min guard Stand pivot transfers: Min guard       General transfer comment: Close guard for safety but pt able to complete  without assist. Momentum utilized to stand.   Ambulation/Gait Ambulation/Gait assistance: Min guard Gait Distance (Feet): 35 Feet Assistive device: Rolling walker (2 wheeled) Gait Pattern/deviations: Step-through pattern;Decreased step length - right;Decreased step length - left;Trunk flexed;Wide base of support Gait velocity: Decreased Gait velocity interpretation: 1.31 - 2.62 ft/sec, indicative of limited community ambulator General Gait Details: In room only on 4L/min supplemental O2. Pt improved gait distance from 1 lap to 2 this session, and pt appeared to tolerate with less fatigue than yesterday's session.    Stairs             Wheelchair Mobility    Modified Rankin (Stroke Patients Only)       Balance Overall balance assessment: Needs assistance Sitting-balance support: No upper extremity supported;Feet supported Sitting balance-Leahy Scale: Good     Standing balance support: Bilateral upper extremity supported Standing balance-Leahy Scale: Poor Standing balance comment: walker and supervision for static standing                            Cognition Arousal/Alertness: Awake/alert Behavior During Therapy: WFL for tasks assessed/performed Overall Cognitive Status: Within Functional Limits for tasks assessed                                        Exercises      General Comments        Pertinent Vitals/Pain Pain Assessment: Faces Faces Pain Scale: Hurts little more Pain Location: bilateral LEs, L worse than R Pain Descriptors /  Indicators: Aching Pain Intervention(s): Monitored during session    Home Living                      Prior Function            PT Goals (current goals can now be found in the care plan section) Acute Rehab PT Goals Patient Stated Goal: to go home PT Goal Formulation: With patient Time For Goal Achievement: 01/23/19 Potential to Achieve Goals: Good Progress towards PT goals:  Progressing toward goals    Frequency    Min 3X/week      PT Plan Current plan remains appropriate    Co-evaluation              AM-PAC PT "6 Clicks" Mobility   Outcome Measure  Help needed turning from your back to your side while in a flat bed without using bedrails?: A Little Help needed moving from lying on your back to sitting on the side of a flat bed without using bedrails?: A Little Help needed moving to and from a bed to a chair (including a wheelchair)?: A Little Help needed standing up from a chair using your arms (e.g., wheelchair or bedside chair)?: A Little Help needed to walk in hospital room?: A Little Help needed climbing 3-5 steps with a railing? : Total 6 Click Score: 16    End of Session Equipment Utilized During Treatment: Oxygen(4 L of O2 via [place]) Activity Tolerance: Patient tolerated treatment well Patient left: in bed;with call bell/phone within reach Nurse Communication: Mobility status PT Visit Diagnosis: Unsteadiness on feet (R26.81)     Time: [id number] PT Time Calculation (min) (ACUTE ONLY): 12 min  Charges:  $Gait Training: 8-22 mins                     Conni SlipperLaura Tajee Savant, PT, DPT Acute Rehabilitation Services Pager: [phone] Office: [phone]    Marylynn PearsonLaura D Jamaar Howes 01/14/2019, 1:10 PM

## 2019-01-14 NOTE — Discharge Summary (Signed)
Physician Discharge Summary  Amy Bryant XOV:[id number] DOB: 01-Jun-1956 DOA: 01/08/2019  PCP: Judy Pimple, MD  Admit date: 01/08/2019 Discharge date: 01/14/2019  Time spent: 55 minutes  Recommendations for Outpatient Follow-up:  1. Follow-up with [name], [name], MD in 1 to 2 weeks.  On follow-up patient will need a CBC done to follow-up on H&H.  Patient also need a basic metabolic profile done to follow-up on electrolytes and renal function.  Patient has been started on oral iron supplementation. 2. Follow-up with Coumadin clinic.  Patient will need PT/INR checked by home health RN on 01/16/2019 and results called into the Coumadin clinic for further recommendations and instructions.   Discharge Diagnoses:  Principal Problem:   Symptomatic anemia Active Problems:   Iron deficiency anemia: Severe   Hypothyroidism   Diabetes type 2, controlled (HCC)   Morbid obesity (HCC)   Obesity hypoventilation syndrome (HCC)   GERD   Atrial fibrillation (HCC)   OSA (obstructive sleep apnea)   Absolute anemia   Coagulopathy (HCC)   Chronic anticoagulation   Discharge Condition: Stable and improved  Diet recommendation: Heart healthy  Filed Weights   01/12/19 0943 01/13/19 0510 01/14/19 0520  Weight: (!) 156 kg (!) 156.2 kg (!) 157.2 kg    History of present illness:  Per Dr. Lovena Le is a 63 y.o. female with medical history significant of atrial fibrillation on Coumadin, obesity hypoventilation syndrome with CPAP at nighttime, chronic respiratory failure with chronic 4 L of nasal cannula use, morbid obesity, hypothyroidism, chronic pressure ulcer wounds on bilateral posterior thighs.  She presented with a chief complaint of shortness of breath, generalized fatigue and weakness.  She also admitted to shortness of breath.  She was evaluated by PCP 2 days prior to admission, hemoglobin was low around 5 at the time.  She stated that she has had some blood loss from the skin breakdown  of her wounds on the posterior bilateral thighs, but denied any hematemesis, hematochezia, melena or vaginal bleeding.  She stated that she had never had a colonoscopy.  ED Course: Lab obtained which revealed a hemoglobin 5.6, MCV 76.8, iron deficiency with iron 13, ferritin 4 and TIBC 480.  INR 3.2.  FOBT negative.  Patient referred for admission for symptomatic anemia.   Hospital Course:  1 symptomatic anemia/severe iron deficiency anemia Patient had presented with complaints of shortness of breath, generalized fatigue and weakness.  Patient was seen at PCPs office 2 days prior to admission hemoglobin noted to be about 5.  Repeat labs done in the ED showed a hemoglobin of 5.6 with a MCV of 76.8 and iron studies consistent with severe iron deficiency anemia with iron level of 13, ferritin of 4, TIBC of 480.  FOBT done was negative.  INR noted to be at 3.2 as patient noted to be on chronic anticoagulation with Coumadin.  Patient was admitted transfused a total of 3 units packed red blood cells and received 2 doses of IV Feraheme.  Patient was seen in consultation by gastroenterology and patient underwent upper endoscopy on 01/12/2019 which was unremarkable for any source of acute GI bleed.  Biopsies were taken.  Patient subsequently underwent a colonoscopy on 01/12/2019 which was consistent with diverticulosis as well as internal hemorrhoids.  Patient did not have any overt bleeding.  Hemoglobin stabilized at 7.5 by day of discharge.  Patient was discharged home on oral iron supplementation with outpatient follow-up with PCP.  2.  Paroxysmal atrial fibrillation Remained rate  controlled on diltiazem during the hospitalization.  Patient's anticoagulation of Coumadin was held in anticipation of endoscopy and colonoscopy and further evaluation of patient's symptomatic anemia with severe iron deficiency anemia.  Coumadin was resumed per GI recommendations on 01/12/2019.  INR on day of discharge was 1.5.  Patient  was discharged home on Coumadin 7.5 mg on 01/14/2019 and then 5 mg daily with repeat INR on Friday, 01/16/2019 and follow-up with the Coumadin clinic.  Patient was discharged in stable and improved condition.  3.  Diabetes mellitus type 2 Patient's oral hypoglycemic agents were held during the hospitalization.  Patient was maintained on sliding scale insulin.  4.  Chronic hypoxic respiratory failure Remained stable.  Patient was on home O2 of 4 L/min.  5.  Morbid obesity  6.  Obstructive sleep apnea Patient was maintained on CPAP nightly.  7.  Hypothyroidism Patient maintained on home dose Synthroid.  8.  Hyperlipidemia Patient maintained on statin.  9.  Depression Remained stable.  Patient maintained on home regimen of Prozac.  10.  Chronic bilateral posterior thigh wounds, POA Patient was seen by wound care.  Not pressure ulcers.  Outpatient follow-up.  Procedures:  Upper endoscopy 01/12/2019 per Dr. Rhea Belton  Colonoscopy 01/12/2019 per Dr. Rhea Belton  Status post transfusion of 3 units packed red blood cells  Consultations:  Gastroenterology: Dr. Meridee Score 01/08/2019  Wound care: Gwenith Spitz, RN 01/09/2019  Discharge Exam: Vitals:   01/14/19 0823 01/14/19 0945  BP:  (!) 140/56  Pulse:  63  Resp:    Temp:  98.2 F (36.8 C)  SpO2: 95% 98%    General: NAD Cardiovascular: RRR Respiratory: CTAB anterior lung fields.  Discharge Instructions   Discharge Instructions    Diet - low sodium heart healthy   Complete by:  As directed    Increase activity slowly   Complete by:  As directed    Check INR on Friday 01/16/2019 and call results to coumadin clinic of f/u with coumadin clinic.     Allergies as of 01/14/2019      Reactions   Pseudoephedrine Other (See Comments)   REACTION: tightness in chest   Augmentin [amoxicillin-pot Clavulanate] Itching, Rash   Did it involve swelling of the face/tongue/throat, SOB, or low BP? No Did it involve sudden or severe rash/hives,  skin peeling, or any reaction on the inside of your mouth or nose? Yes Did you need to seek medical attention at a hospital or doctor's office? Yes When did it last happen?2019 If all above answers are "NO", may proceed with cephalosporin use.   Latex Itching, Rash      Medication List    TAKE these medications   albuterol (2.5 MG/3ML) 0.083% nebulizer solution Commonly known as:  PROVENTIL USE 1 VIAL IN NEBULIZER EVERY 4 HOURS AS NEEDED FOR  WHEEZING What changed:    how much to take  how to take this  when to take this  reasons to take this  additional instructions   budesonide-formoterol 160-4.5 MCG/ACT inhaler Commonly known as:  Symbicort INHALE 2 PUFFS INTO LUNGS TWICE A DAY What changed:    how much to take  how to take this  when to take this  additional instructions   cholecalciferol 1000 units tablet Commonly known as:  VITAMIN D Take 4,000 Units by mouth daily.   ciclopirox 0.77 % cream Commonly known as:  LOPROX APPLY TO AFFECTED AREA TWICE A DAY AS NEEDED What changed:    how much to take  how  to take this  when to take this  reasons to take this  additional instructions   diclofenac 75 MG EC tablet Commonly known as:  VOLTAREN TAKE ONE TABLET BY MOUTH TWICE DAILY AS NEEDED FOR  PAIN  WITH  FOOD. What changed:    how much to take  how to take this  when to take this  reasons to take this  additional instructions   diltiazem 120 MG 24 hr capsule Commonly known as:  Cartia XT Take 1 capsule (120 mg total) by mouth daily.   Elocon 0.1 % cream Generic drug:  mometasone Apply 1 application topically See admin instructions. Tiny amount to each ear canal twice weekly as needed.   FLUoxetine 20 MG tablet Commonly known as:  PROZAC Take 1 tablet (20 mg total) by mouth daily.   furosemide 80 MG tablet Commonly known as:  LASIX Take 1 tablet (80 mg total) by mouth 2 (two) times daily.   glipiZIDE 5 MG 24 hr  tablet Commonly known as:  GLUCOTROL XL TAKE 1 TABLET BY MOUTH EVERY DAY What changed:  when to take this   glucose blood test strip Test blood sugar once daily and as needed for DM 250.0   iron polysaccharides 150 MG capsule Commonly known as:  Nu-Iron Take 1 capsule (150 mg total) by mouth daily.   levothyroxine 175 MCG tablet Commonly known as:  SYNTHROID, LEVOTHROID Take 1 tablet (175 mcg total) by mouth daily before breakfast. What changed:  See the new instructions.   loratadine 10 MG tablet Commonly known as:  CLARITIN Take 10 mg by mouth daily.   metFORMIN 1000 MG tablet Commonly known as:  GLUCOPHAGE TAKE 1 TABLET BY MOUTH TWICE DAILY WITH A MEAL What changed:    how much to take  how to take this  when to take this  additional instructions   mupirocin ointment 2 % Commonly known as:  BACTROBAN APPLY TO AFFECTED AREA TWICE A DAY What changed:    how much to take  how to take this  when to take this  additional instructions   omega-3 acid ethyl esters 1 g capsule Commonly known as:  LOVAZA Take 1 g by mouth 2 (two) times daily.   onetouch ultrasoft lancets Test blood sugar once daily and as needed for DM 250.0   pantoprazole 40 MG tablet Commonly known as:  PROTONIX Take 1 tablet (40 mg total) by mouth daily at 6 (six) AM. Start taking on:  January 15, 2019   potassium chloride SA 20 MEQ tablet Commonly known as:  Klor-Con M20 TAKE 2 TABLETS BY MOUTH EVERY DAY What changed:    how much to take  how to take this  when to take this  additional instructions   simvastatin 20 MG tablet Commonly known as:  ZOCOR Take 1 tablet (20 mg total) by mouth at bedtime.   traMADol 50 MG tablet Commonly known as:  ULTRAM Take 1 tablet (50 mg total) by mouth every 12 (twelve) hours as needed for moderate pain or severe pain. Caution of sedation/falls and constipation   warfarin 5 MG tablet Commonly known as:  COUMADIN Take as directed. If you are  unsure how to take this medication, talk to your nurse or doctor. Original instructions:  Take 1-1.5 tablets (5-7.5 mg total) by mouth daily at 6 PM. Take 1.5 tablets (7.5mg ) today, then 1 tablet ( ) daily then AS DIRECTED BY THE ANTICOAGULATION CLINIC. What changed:    how much  to take  how to take this  when to take this  additional instructions            Durable Medical Equipment  (From admission, onward)         Start     Ordered   01/14/19 0817  For home use only DME 3 n 1  Once    Comments:  BARIATRIC   01/14/19 0816         Allergies  Allergen Reactions  . Pseudoephedrine Other (See Comments)    REACTION: tightness in chest  . Augmentin [Amoxicillin-Pot Clavulanate] Itching and Rash    Did it involve swelling of the face/tongue/throat, SOB, or low BP? No Did it involve sudden or severe rash/hives, skin peeling, or any reaction on the inside of your mouth or nose? Yes Did you need to seek medical attention at a hospital or doctor's office? Yes When did it last happen?2019 If all above answers are "NO", may proceed with cephalosporin use.   . Latex Itching and Rash   Follow-up Information    [facility].   Specialty:  Home Health Services Why:  home health services arranged Contact information: [address] [place] [address] [phone]        Dr. Roxy Manns. Schedule an appointment as soon as possible for a visit in 1 week(s).   Why:  Home health nurse will draw INR on 01/16/2019 with home visit. Follow up in 1-2 weeks. Contact information: Dr. Colon Flattery [facility] [address] [place], [place] [address] [phone]           The results of significant diagnostics from this hospitalization (including imaging, microbiology, ancillary and laboratory) are listed below for reference.    Significant Diagnostic Studies: No results found.  Microbiology: No results found for this or any  previous visit (from the past 240 hour(s)).   Labs: Basic Metabolic Panel: Recent Labs  Lab 01/10/19 0346 01/11/19 0302 01/12/19 0414 01/13/19 0415 01/14/19 0239  NA 141 141 141 138 140  K 3.6 3.5 4.0 4.0 3.3*  CL 99 98 99 98 97*  CO2 33* 36* 32 32 34*  GLUCOSE 103* 105* 120* 139* 108*  BUN CREATININE 1.12* 0.99 0.96 0.98 0.96  CALCIUM 11.0* 11.3* 11.1* 11.3* 11.1*  MG 2.1 2.1  --  2.0 2.0   Liver Function Tests: Recent Labs  Lab 01/08/19 1208  AST 14*  ALT 10  ALKPHOS 108  BILITOT 0.4  PROT 6.9  ALBUMIN 3.2*   No results for input(s): LIPASE, AMYLASE in the last 168 hours. No results for input(s): AMMONIA in the last 168 hours. CBC: Recent Labs  Lab 01/10/19 0346 01/11/19 0302 01/12/19 0414 01/13/19 0415 01/14/19 0239  WBC 7.9 8.8 7.8 7.7 6.8  NEUTROABS 6.0 6.9 6.0 6.9 5.0  HGB 7.6* 7.6* 8.1* 7.1* 7.5*  HCT 27.7* 27.7* 29.6* 27.0* 27.3*  MCV 77.8* 78.9* 79.6* 81.3 83.0  PLT 319 317 331 285 275   Cardiac Enzymes: Recent Labs  Lab 01/08/19 1208  TROPONINI <0.03   BNP: BNP (last 3 results) No results for input(s): BNP in the last 8760 hours.  ProBNP (last 3 results) No results for input(s): PROBNP in the last 8760 hours.  CBG: Recent Labs  Lab 01/13/19 1224 01/13/19 1617 01/13/19 2117 01/14/19 0750 01/14/19 1153  GLUCAP 136* 103* 120* 91 91       Signed:  Ramiro Harvestaniel Thompson MD.  [facility] 01/14/2019, 12:48 PM

## 2019-01-14 NOTE — Care Management Important Message (Signed)
Important Message  Patient Details  Name: Amy Bryant MRN: [id number] Date of Birth: 02-03-1956   Medicare Important Message Given:  Yes    Dorena Bodo 01/14/2019, 9:52 AM

## 2019-01-14 NOTE — Telephone Encounter (Signed)
Huntley Dec called from hospital. She said pt needs to be set up for INR on Friday. She is aware the [facility] nurse will draw at her home.

## 2019-01-14 NOTE — Progress Notes (Signed)
ANTICOAGULATION CONSULT NOTE - Initial Consult  Pharmacy Consult for warfarin Indication: atrial fibrillation and pulmonary embolus  Allergies  Allergen Reactions  . Pseudoephedrine Other (See Comments)    REACTION: tightness in chest  . Augmentin [Amoxicillin-Pot Clavulanate] Itching and Rash    Did it involve swelling of the face/tongue/throat, SOB, or low BP? No Did it involve sudden or severe rash/hives, skin peeling, or any reaction on the inside of your mouth or nose? Yes Did you need to seek medical attention at a hospital or doctor's office? Yes When did it last happen?2019 If all above answers are "NO", may proceed with cephalosporin use.   . Latex Itching and Rash    Patient Measurements: Height: 5\' 4"  (162.6 cm) Weight: (!) 346 lb 9 oz (157.2 kg) IBW/kg (Calculated) : 54.7  Vital Signs: Temp: 97.7 F (36.5 C) (04/15 0520) Temp Source: Oral (04/15 0520) BP: 149/61 (04/15 0520) Pulse Rate: 66 (04/14 2224)  Labs: Recent Labs    01/12/19 0414 01/13/19 0415 01/14/19 0239  HGB 8.1* 7.1* 7.5*  HCT 29.6* 27.0* 27.3*  PLT 331 285 275  LABPROT 17.9* 17.3* 17.3*  INR 1.5* 1.4* 1.4*  CREATININE 0.96 0.98 0.96    Estimated Creatinine Clearance: 90.6 mL/min (by C-G formula based on SCr of 0.96 mg/dL).   Medical History: Past Medical History:  Diagnosis Date  . Atrial fibrillation (HCC)    with cardioversion success  . CHF (congestive heart failure) (HCC)   . COPD (chronic obstructive pulmonary disease) (HCC)   . Cor pulmonale (HCC)    and obesity hypoventilation syndrome  . Diabetes mellitus without complication (HCC)   . GERD (gastroesophageal reflux disease)   . Hypothyroid   . Morbid obesity (HCC)   . OA (osteoarthritis)   . Pulmonary embolism (HCC)    hx of on coumadin  . Reactive airways dysfunction syndrome (HCC)     Assessment: 18 yof presented to the ED with SOB. She is on warfarin for history of afib and PE. Found to be anemia with Hgb  of 5.6. INR was also supratherapeutic at 3.2.   Patient now to be restarted on warfarin after being cleared by GI. INR 1.4 today. Hgb 8.1>>7.1 > 7.5this AM. No bleeding noted  Goal of Therapy:  INR 2-3 Monitor platelets by anticoagulation protocol: Yes   Plan:  Warfarin 7.5mg  PO x 1 Daily INR   Zaheer Wageman A. Jeanella Craze, PharmD, BCPS Clinical Pharmacist [place] Please utilize Amion for appropriate phone number to reach the unit pharmacist [facility])    01/14/2019,8:42 AM

## 2019-01-14 NOTE — TOC Transition Note (Addendum)
Transition of Care [facility]) - CM/SW Discharge Note   Patient Details  Name: Amy Bryant MRN: [id number] Date of Birth: [birth year]  [facility] CM/SW Contact:  Epifanio Lesches, RN Phone Number: 01/14/2019, 10:53 AM   Clinical Narrative:    Pt to transition to home with home health services. Home health nurse to draw INR with home visit on 4/17, NCM made [facility] liaison aware. Pt has transportation to home.  TOC pharmacy to deliver Rx meds prior to d/c.  Final next level of care: Home w Home Health Services Barriers to Discharge: Barriers Resolved   Patient Goals and CMS Choice Patient states their goals for this hospitalization and ongoing recovery are:: to feel better and do more walking CMS Medicare.gov Compare Post Acute Care list provided to:: Patient Choice offered to / list presented to : Patient  Discharge Placement                       Discharge Plan and Services In-house Referral: NA Discharge Planning Services: CM Consult Post Acute Care Choice: Home Health          DME Arranged: N/A(owns walker, W/C) DME Agency: NA HH Arranged: PT, OT, RN, Nurse's Aide, Social Work [employer] Agency: [facility]   Social Determinants of Health (SDOH) Interventions     Readmission Risk Interventions No flowsheet data found.

## 2019-01-14 NOTE — Telephone Encounter (Signed)
Thanks

## 2019-01-15 ENCOUNTER — Encounter: Payer: Self-pay | Admitting: Internal Medicine

## 2019-01-15 ENCOUNTER — Other Ambulatory Visit: Payer: Self-pay | Admitting: Family Medicine

## 2019-01-15 ENCOUNTER — Telehealth: Payer: Self-pay

## 2019-01-15 NOTE — Telephone Encounter (Signed)
Pt was d/c from hospital so all of the info Tiffany with [facility] needed was faxed to them. Cpap order, Sleep Study order, CMN, and office note

## 2019-01-15 NOTE — Telephone Encounter (Signed)
Per discharge summary,  Recommendations for Outpatient Follow-up:  1. Follow-up with [name], [name], MD in 1 to 2 weeks.  On follow-up patient will need a CBC done to follow-up on H&H.  Patient also need a basic metabolic profile done to follow-up on electrolytes and renal function.  Patient has been started on oral iron supplementation.  Patient has declined in person visit with PCP at this time. Virtual video appointment scheduled 01/20/19 @ 1015. Granddaughters will be present to assist patient with visit.   Note: PCP is aware of lab orders and will request home health nurse obtain labs.

## 2019-01-15 NOTE — Telephone Encounter (Signed)
Aware- I will see her then as planned and order labs at that time

## 2019-01-15 NOTE — Telephone Encounter (Signed)
Discharged on 01/14/2019 from [facility] with dx of symptomatic anemia.   Transitional Care Management Follow-up Telephone Call    Date discharged? 01/14/2019  How have you been since you were released from the hospital? Recovering.   Any patient concerns? None   Items Reviewed:  Medications reviewed: Yes  Allergies reviewed: Yes  Dietary changes reviewed: Yes  Referrals reviewed: Yes,   Functional Questionnaire:  Independent - I Dependent - D    Activities of Daily Living (ADLs):    Personal hygiene - I Dressing - I Eating - I Maintaining continence - I Transferring - walker, wheelchair  Independent Activities of Daily Living (iADLs): Basic communication skills - I Transportation - I Meal preparation  - son and husband Shopping - son Housework - son Managing medications - I  Managing personal finances - I   Confirmed importance and date/time of follow-up visits scheduled YES  Provider Appointment booked with PCP 01/16/19 @ 1045  Confirmed with patient if condition begins to worsen call PCP or go to the ER.  Patient was given the office number and encouraged to call back with question or concerns: YES

## 2019-01-16 ENCOUNTER — Ambulatory Visit (INDEPENDENT_AMBULATORY_CARE_PROVIDER_SITE_OTHER): Payer: Medicare HMO

## 2019-01-16 DIAGNOSIS — Z7984 Long term (current) use of oral hypoglycemic drugs: Secondary | ICD-10-CM | POA: Diagnosis not present

## 2019-01-16 DIAGNOSIS — E119 Type 2 diabetes mellitus without complications: Secondary | ICD-10-CM | POA: Diagnosis not present

## 2019-01-16 DIAGNOSIS — E7800 Pure hypercholesterolemia, unspecified: Secondary | ICD-10-CM | POA: Diagnosis not present

## 2019-01-16 DIAGNOSIS — E039 Hypothyroidism, unspecified: Secondary | ICD-10-CM | POA: Diagnosis not present

## 2019-01-16 DIAGNOSIS — E662 Morbid (severe) obesity with alveolar hypoventilation: Secondary | ICD-10-CM | POA: Diagnosis not present

## 2019-01-16 DIAGNOSIS — L89891 Pressure ulcer of other site, stage 1: Secondary | ICD-10-CM | POA: Diagnosis not present

## 2019-01-16 DIAGNOSIS — Z86718 Personal history of other venous thrombosis and embolism: Secondary | ICD-10-CM

## 2019-01-16 DIAGNOSIS — I4891 Unspecified atrial fibrillation: Secondary | ICD-10-CM | POA: Diagnosis not present

## 2019-01-16 DIAGNOSIS — Z5181 Encounter for therapeutic drug level monitoring: Secondary | ICD-10-CM | POA: Diagnosis not present

## 2019-01-16 DIAGNOSIS — S81801D Unspecified open wound, right lower leg, subsequent encounter: Secondary | ICD-10-CM | POA: Diagnosis not present

## 2019-01-16 DIAGNOSIS — Z86711 Personal history of pulmonary embolism: Secondary | ICD-10-CM

## 2019-01-16 DIAGNOSIS — I4821 Permanent atrial fibrillation: Secondary | ICD-10-CM

## 2019-01-16 DIAGNOSIS — I279 Pulmonary heart disease, unspecified: Secondary | ICD-10-CM | POA: Diagnosis not present

## 2019-01-16 LAB — POCT INR: INR: 2.4 (ref 2.0–3.0)

## 2019-01-16 NOTE — Patient Instructions (Signed)
INR today 2.4  Continue taking prior dosing of 5mg  Sun, Tue, Thurs and 2.5mg  on Mond, Wed, Fri and Saturday recheck in 1 week.  Patient and home health nurse Ruffin Frederick made aware of instructions. Will continue to monitor closely following recent hospitalization.    Patient reports that she is feeling much better at this time and denies any unusual bruising or bleeding.

## 2019-01-19 ENCOUNTER — Telehealth: Payer: Self-pay

## 2019-01-19 DIAGNOSIS — Z7984 Long term (current) use of oral hypoglycemic drugs: Secondary | ICD-10-CM | POA: Diagnosis not present

## 2019-01-19 DIAGNOSIS — E119 Type 2 diabetes mellitus without complications: Secondary | ICD-10-CM | POA: Diagnosis not present

## 2019-01-19 DIAGNOSIS — S81801D Unspecified open wound, right lower leg, subsequent encounter: Secondary | ICD-10-CM | POA: Diagnosis not present

## 2019-01-19 DIAGNOSIS — I279 Pulmonary heart disease, unspecified: Secondary | ICD-10-CM | POA: Diagnosis not present

## 2019-01-19 DIAGNOSIS — E039 Hypothyroidism, unspecified: Secondary | ICD-10-CM | POA: Diagnosis not present

## 2019-01-19 DIAGNOSIS — I4891 Unspecified atrial fibrillation: Secondary | ICD-10-CM | POA: Diagnosis not present

## 2019-01-19 DIAGNOSIS — E7800 Pure hypercholesterolemia, unspecified: Secondary | ICD-10-CM | POA: Diagnosis not present

## 2019-01-19 DIAGNOSIS — E662 Morbid (severe) obesity with alveolar hypoventilation: Secondary | ICD-10-CM | POA: Diagnosis not present

## 2019-01-19 DIAGNOSIS — L89891 Pressure ulcer of other site, stage 1: Secondary | ICD-10-CM | POA: Diagnosis not present

## 2019-01-19 NOTE — Telephone Encounter (Signed)
[facility] nurse called and spoke with Syliva Overman earlier this morning. She would like Dr. [name] to address the following on her virtual visit for tomorrow. (The name and contact is not available to me- I am passing along since [place] had to leave the office in a hurry.) 1.  patient has been experiencing increased anxiety and panic attacks, was given xanax in hospital and may need some prescribed.   2.  hospital physicians wanted Dr. [name] to consider discontinuing patients coumadin  therapy as they feel like the risks outweigh the benefits for her and now may be time to stop therapy.

## 2019-01-19 NOTE — Telephone Encounter (Signed)
Aware, thanks , we will address at her virtual visit tomorrow. We will weigh pros and cons of coumadin

## 2019-01-20 ENCOUNTER — Ambulatory Visit (INDEPENDENT_AMBULATORY_CARE_PROVIDER_SITE_OTHER): Payer: Medicare HMO | Admitting: Family Medicine

## 2019-01-20 ENCOUNTER — Encounter: Payer: Self-pay | Admitting: Family Medicine

## 2019-01-20 DIAGNOSIS — Z86718 Personal history of other venous thrombosis and embolism: Secondary | ICD-10-CM

## 2019-01-20 DIAGNOSIS — E119 Type 2 diabetes mellitus without complications: Secondary | ICD-10-CM | POA: Diagnosis not present

## 2019-01-20 DIAGNOSIS — E662 Morbid (severe) obesity with alveolar hypoventilation: Secondary | ICD-10-CM

## 2019-01-20 DIAGNOSIS — G4733 Obstructive sleep apnea (adult) (pediatric): Secondary | ICD-10-CM | POA: Diagnosis not present

## 2019-01-20 DIAGNOSIS — D649 Anemia, unspecified: Secondary | ICD-10-CM

## 2019-01-20 DIAGNOSIS — J4540 Moderate persistent asthma, uncomplicated: Secondary | ICD-10-CM

## 2019-01-20 DIAGNOSIS — F410 Panic disorder [episodic paroxysmal anxiety] without agoraphobia: Secondary | ICD-10-CM

## 2019-01-20 DIAGNOSIS — Z7409 Other reduced mobility: Secondary | ICD-10-CM | POA: Diagnosis not present

## 2019-01-20 DIAGNOSIS — Z7984 Long term (current) use of oral hypoglycemic drugs: Secondary | ICD-10-CM | POA: Diagnosis not present

## 2019-01-20 DIAGNOSIS — I279 Pulmonary heart disease, unspecified: Secondary | ICD-10-CM

## 2019-01-20 DIAGNOSIS — F329 Major depressive disorder, single episode, unspecified: Secondary | ICD-10-CM

## 2019-01-20 DIAGNOSIS — L89891 Pressure ulcer of other site, stage 1: Secondary | ICD-10-CM | POA: Diagnosis not present

## 2019-01-20 DIAGNOSIS — I4821 Permanent atrial fibrillation: Secondary | ICD-10-CM

## 2019-01-20 DIAGNOSIS — D509 Iron deficiency anemia, unspecified: Secondary | ICD-10-CM | POA: Diagnosis not present

## 2019-01-20 DIAGNOSIS — L89200 Pressure ulcer of unspecified hip, unstageable: Secondary | ICD-10-CM

## 2019-01-20 DIAGNOSIS — E039 Hypothyroidism, unspecified: Secondary | ICD-10-CM | POA: Diagnosis not present

## 2019-01-20 DIAGNOSIS — S81801D Unspecified open wound, right lower leg, subsequent encounter: Secondary | ICD-10-CM | POA: Diagnosis not present

## 2019-01-20 DIAGNOSIS — K219 Gastro-esophageal reflux disease without esophagitis: Secondary | ICD-10-CM

## 2019-01-20 DIAGNOSIS — I4891 Unspecified atrial fibrillation: Secondary | ICD-10-CM | POA: Diagnosis not present

## 2019-01-20 DIAGNOSIS — E213 Hyperparathyroidism, unspecified: Secondary | ICD-10-CM

## 2019-01-20 DIAGNOSIS — E7800 Pure hypercholesterolemia, unspecified: Secondary | ICD-10-CM | POA: Diagnosis not present

## 2019-01-20 MED ORDER — ALPRAZOLAM 0.5 MG PO TABS: 0.5000 mg | ORAL_TABLET | Freq: Two times a day (BID) | ORAL | 0 refills | Status: DC | PRN

## 2019-01-20 MED ORDER — FLUOXETINE HCL 40 MG PO CAPS: 40.0000 mg | ORAL_CAPSULE | Freq: Every day | ORAL | 3 refills | Status: DC

## 2019-01-20 NOTE — Assessment & Plan Note (Signed)
Pt continues to take warfarin for severe PE that caused cor pulmonale in the beginning  She is 02 dependent (also due to obestity hypoventilation syndrome)

## 2019-01-20 NOTE — Assessment & Plan Note (Signed)
Severe iron def anemia with Hb of 5.6 at presentation to the hospital  Reviewed hospital records, lab results and studies in detail  No source of bleeding found except chronic thigh wounds which pt states have bled constantly for months  Those are treated  Pt had 3 u transfusion and 2 doses of IV feraheme  She takes nu  Iron 150 mg daily now  Hb at dc was 7.5 - improving  Her energy level and sob on exertion are also improving  Goal is to tx orally /if unable would consider IV iron  Also tx wounds that are bleeding Would like to stop her coumadin if possible (taking for h/o PE with cor pulmonale in the setting of poor mobility/ morbid obesity)  When ready-would like to consult pulmonary re: this  

## 2019-01-20 NOTE — Assessment & Plan Note (Signed)
Continues protonix Recent severe anemia with clear EGD Pt insists on continuing diclofenac (understands risks)  No c/o of symptoms

## 2019-01-20 NOTE — Assessment & Plan Note (Signed)
Lab Results  Component Value Date   CALCIUM 11.1 (H) 01/14/2019   PHOS 2.5 12/03/2017   Pt has been unable to see surgeon due to financial and mobility issues

## 2019-01-20 NOTE — Assessment & Plan Note (Signed)
Severe iron def anemia with Hb of 5.6 at presentation to the hospital  Reviewed hospital records, lab results and studies in detail  No source of bleeding found except chronic thigh wounds which pt states have bled constantly for months  Those are treated  Pt had 3 u transfusion and 2 doses of IV feraheme  She takes nu  Iron 150 mg daily now  Hb at dc was 7.5 - improving  Her energy level and sob on exertion are also improving  Goal is to tx orally /if unable would consider IV iron  Also tx wounds that are bleeding Would like to stop her coumadin if possible (taking for h/o PE with cor pulmonale in the setting of poor mobility/ morbid obesity)  When ready-would like to consult pulmonary re: this

## 2019-01-20 NOTE — Assessment & Plan Note (Signed)
Stable Lab Results  Component Value Date   HGBA1C 5.7 12/03/2017   Goal to get her well enough/mobile enough to come into the office for reg visits and weight checks

## 2019-01-20 NOTE — Assessment & Plan Note (Signed)
Stress-recent hosp and pandemic scare  Will inc prozac to 40 mg -disc poss side eff  Also to help anx/panic symptoms

## 2019-01-20 NOTE — Assessment & Plan Note (Signed)
Reviewed hospital records, lab results and studies in detail  Now that anemia is improved pt feels able to start working more on mobility  She is using a wheelchair when needed and PT will start back at home next week  Ultimate goal is to be able to leave household to get to DR visits (Millie her social worker is the contact)  Enc wt loss if able as well

## 2019-01-20 NOTE — Assessment & Plan Note (Addendum)
Cor pulmonale From past PE Is on lifelong coumadin (per Dr Shelle Iron in 09- or until she can loose weight)  Now has been found to be severely anemic (? Due to bleeding from thigh wounds)  I would ultimately like to get her off of the warfarin if possible  Goal is to get her well enough to f/u with pulmonary for this and obesity hypoventilation syndrome and reactive airway dz   (02 dep)

## 2019-01-20 NOTE — Progress Notes (Signed)
Virtual Visit via Video Note  I connected with Amy Bryant on 01/20/19 at 10:45 AM EDT by a video enabled telemedicine application and verified that I am speaking with the correct person using two identifiers. The patient is in her home today  I am in my office at [facility]  Video visit attempted and failed unfortunately so this was conducted by phone    I discussed the limitations of evaluation and management by telemedicine and the availability of in person appointments. The patient expressed understanding and agreed to proceed.  History of Present Illness: Here for f/u of hospitalization for severe anemia from 01/08/19 to 01/14/19  For severe anemia (Hb of 5.6) with fatigue and sob as well as ulcers (? Pressure) on bilateral post thighs   In ED: ED Course:Lab obtained which revealed a hemoglobin 5.6, MCV 76.8, iron deficiency with iron 13, ferritin 4 and TIBC 480. INR 3.2. FOBT negative. Patient referred for admission for symptomatic anemia.  Hospital course was as follows: History of present illness:  Per Dr. Signa Kell a 63 y.o.femalewith medical history significant ofatrial fibrillation on Coumadin, obesity hypoventilation syndrome with CPAP at nighttime, chronic respiratory failure with chronic 4 L of nasal cannula use, morbid obesity, hypothyroidism, chronic pressure ulcer wounds on bilateral posterior thighs. She presented with a chief complaint of shortness of breath, generalized fatigue and weakness. She also admitted to shortness of breath. She was evaluated by PCP 2 days prior to admission, hemoglobin was low around 5 at the time. She stated that she has had some blood loss from the skin breakdown of her wounds on the posterior bilateral thighs, but denied any hematemesis, hematochezia, melena or vaginal bleeding. She stated that she had never had a colonoscopy.  ED Course:Lab obtained which revealed a hemoglobin 5.6, MCV 76.8, iron deficiency with  iron 13, ferritin 4 and TIBC 480. INR 3.2. FOBT negative. Patient referred for admission for symptomatic anemia.   Hospital Course:  1 symptomatic anemia/severe iron deficiency anemia Patient had presented with complaints of shortness of breath, generalized fatigue and weakness.  Patient was seen at PCPs office 2 days prior to admission hemoglobin noted to be about 5.  Repeat labs done in the ED showed a hemoglobin of 5.6 with a MCV of 76.8 and iron studies consistent with severe iron deficiency anemia with iron level of 13, ferritin of 4, TIBC of 480.  FOBT done was negative.  INR noted to be at 3.2 as patient noted to be on chronic anticoagulation with Coumadin.  Patient was admitted transfused a total of 3 units packed red blood cells and received 2 doses of IV Feraheme.  Patient was seen in consultation by gastroenterology and patient underwent upper endoscopy on 01/12/2019 which was unremarkable for any source of acute GI bleed.  Biopsies were taken.  Patient subsequently underwent a colonoscopy on 01/12/2019 which was consistent with diverticulosis as well as internal hemorrhoids.  Patient did not have any overt bleeding.  Hemoglobin stabilized at 7.5 by day of discharge.  Patient was discharged home on oral iron supplementation with outpatient follow-up with PCP.  Pt feels much better now after her iron and feraheme  She has had bad wounds =and pt thinks they were the source of bleeding   Still taking nu iron 150 mg daily  Makes her a little constipated - has stool softener   Per pt= her mother had severe anemia - was given iron     2.  Paroxysmal  atrial fibrillation Remained rate controlled on diltiazem during the hospitalization.  Patient's anticoagulation of Coumadin was held in anticipation of endoscopy and colonoscopy and further evaluation of patient's symptomatic anemia with severe iron deficiency anemia.  Coumadin was resumed per GI recommendations on 01/12/2019.  INR on day of  discharge was 1.5.  Patient was discharged home on Coumadin 7.5 mg on 01/14/2019 and then 5 mg daily with repeat INR on Friday, 01/16/2019 and follow-up with the Coumadin clinic.  Patient was discharged in stable and improved condition.  3.  Diabetes mellitus type 2 Patient's oral hypoglycemic agents were held during the hospitalization.  Patient was maintained on sliding scale insulin.  4.  Chronic hypoxic respiratory failure Remained stable.  Patient was on home O2 of 4 L/min. she is much less sob with exertion  Will start OT and PT   5.  Morbid obesity  6.  Obstructive sleep apnea Patient was maintained on CPAP nightly.  7.  Hypothyroidism Patient maintained on home dose Synthroid.  8.  Hyperlipidemia Patient maintained on statin.  9.  Depression Remained stable.  Patient maintained on home regimen of Prozac.  10.  Chronic bilateral posterior thigh wounds, POA Patient was seen by wound care.  Not pressure ulcers.  Outpatient follow-up. pt thinks this was the source of the bleeding  At home she is using some bandages and some silicone pads/ - home care is looking after that which is good   Anxiety-having some bad panic attacks Xanax helped when she had them  She is taking prozac right now  (no side effects on the 20 mg)    Panic attacks last 15-20 minutes         Procedures:  Upper endoscopy 01/12/2019 per Dr. Rhea Belton  Colonoscopy 01/12/2019 per Dr. Rhea Belton  Status post transfusion of 3 units packed red blood cells  Consultations:  Gastroenterology: Dr. Meridee Score 01/08/2019  Wound care: Gwenith Spitz, RN 01/09/2019   labs at d/c Lab Results  Component Value Date   CREATININE 0.96 01/14/2019   BUN 13 01/14/2019   NA 140 01/14/2019   K 3.3 (L) 01/14/2019   CL 97 (L) 01/14/2019   CO2 34 (H) 01/14/2019   Glucose 108 Lab Results  Component Value Date   ALT 10 01/08/2019   AST 14 (L) 01/08/2019   ALKPHOS 108 01/08/2019   BILITOT 0.4 01/08/2019     Lab Results  Component Value Date   CALCIUM 11.1 (H) 01/14/2019   PHOS 2.5 12/03/2017    Lab Results  Component Value Date   WBC 6.8 01/14/2019   HGB 7.5 (L) 01/14/2019   HCT 27.3 (L) 01/14/2019   MCV 83.0 01/14/2019   PLT 275 01/14/2019   Lab Results  Component Value Date   FERRITIN 4 (L) 01/08/2019   Lab Results  Component Value Date   INR 2.4 01/16/2019   INR 1.4 (H) 01/14/2019   INR 1.4 (H) 01/13/2019   Lab Results  Component Value Date   HGBA1C 5.7 12/03/2017   Energy level is better    Review of Systems  Constitutional: Positive for malaise/fatigue. Negative for chills, diaphoresis, fever and weight loss.  HENT: Negative for congestion.   Eyes: Negative for blurred vision and pain.  Respiratory: Positive for shortness of breath. Negative for cough and wheezing.        Sob on exertion is much improved   Cardiovascular: Positive for leg swelling. Negative for chest pain, palpitations, orthopnea, claudication and PND.  Baseline leg swelling   Gastrointestinal: Negative for abdominal pain, blood in stool, diarrhea, heartburn, melena, nausea and vomiting.  Genitourinary: Negative for dysuria and hematuria.  Musculoskeletal: Positive for back pain, joint pain and myalgias.  Neurological: Negative for dizziness and headaches.  Endo/Heme/Allergies: Bruises/bleeds easily.  Psychiatric/Behavioral: Positive for depression. Negative for memory loss. The patient is nervous/anxious.     Patient Active Problem List   Diagnosis Date Noted  . Panic attacks 01/20/2019  . Iron deficiency anemia: Severe 01/14/2019  . Pressure injury of skin 01/14/2019  . Symptomatic anemia   . Coagulopathy (HCC)   . Chronic anticoagulation   . Absolute anemia 01/08/2019  . Reactive depression (situational) 12/29/2018  . Mobility impaired 12/12/2018  . Hematuria 12/03/2017  . Pedal edema 12/23/2016  . OSA (obstructive sleep apnea) 12/21/2016  . Financial difficulties 09/16/2016  .  Colon cancer screening 12/15/2013  . Encounter for therapeutic drug monitoring 11/05/2013  . Routine general medical examination at a health care facility 05/20/2012  . Atrial fibrillation (HCC) 11/23/2010  . History of pulmonary embolism 11/23/2010  . Vitamin D deficiency 11/15/2010  . DYSURIA, HX OF 08/19/2009  . PURE HYPERCHOLESTEROLEMIA 08/16/2009  . ATRIAL FIBRILLATION, HX OF 12/03/2008  . Diabetes type 2, controlled (HCC) 08/13/2008  . Hyperparathyroidism (HCC) 07/30/2008  . Hypothyroidism 02/13/2008  . Morbid obesity (HCC) 02/04/2008  . Reactive airway disease 02/04/2008  . GERD 02/04/2008  . DISC DISEASE, LUMBAR 02/04/2008  . PULMONARY EMBOLISM, HX OF 02/04/2008  . TOBACCO USE, QUIT 02/04/2008  . Obesity hypoventilation syndrome (HCC) 08/11/2007  . Chronic pulmonary heart disease (HCC) 08/11/2007   Past Medical History:  Diagnosis Date  . Atrial fibrillation (HCC)    with cardioversion success  . CHF (congestive heart failure) (HCC)   . COPD (chronic obstructive pulmonary disease) (HCC)   . Cor pulmonale (HCC)    and obesity hypoventilation syndrome  . Diabetes mellitus without complication (HCC)   . GERD (gastroesophageal reflux disease)   . Hypothyroid   . Morbid obesity (HCC)   . OA (osteoarthritis)   . Pulmonary embolism (HCC)    hx of on coumadin  . Reactive airways dysfunction syndrome [facility])    Past Surgical History:  Procedure Laterality Date  . BIOPSY  01/12/2019   Procedure: BIOPSY;  Surgeon: Beverley Fiedler, MD;  Location: [facility] ENDOSCOPY;  Service: Gastroenterology;;  . COLONOSCOPY WITH PROPOFOL N/A 01/12/2019   Procedure: COLONOSCOPY WITH PROPOFOL;  Surgeon: Beverley Fiedler, MD;  Location: [facility] ENDOSCOPY;  Service: Gastroenterology;  Laterality: N/A;  . ESOPHAGOGASTRODUODENOSCOPY (EGD) WITH PROPOFOL N/A 01/12/2019   Procedure: ESOPHAGOGASTRODUODENOSCOPY (EGD) WITH PROPOFOL;  Surgeon: Beverley Fiedler, MD;  Location: [facility] ENDOSCOPY;  Service: Gastroenterology;   Laterality: N/A;  . TONSILLECTOMY     Social History   Tobacco Use  . Smoking status: Former Games developer  . Smokeless tobacco: Former Engineer, water Use Topics  . Alcohol use: No    Alcohol/week: 0.0 standard drinks  . Drug use: No   Family History  Problem Relation Age of Onset  . Heart disease Father 79       MI  . Diabetes Brother    Allergies  Allergen Reactions  . Pseudoephedrine Other (See Comments)    REACTION: tightness in chest  . Augmentin [Amoxicillin-Pot Clavulanate] Itching and Rash    Did it involve swelling of the face/tongue/throat, SOB, or low BP? No Did it involve sudden or severe rash/hives, skin peeling, or any reaction on the inside of your mouth or  nose? Yes Did you need to seek medical attention at a hospital or doctor's office? Yes When did it last happen?2019 If all above answers are "NO", may proceed with cephalosporin use.   . Latex Itching and Rash   Current Outpatient Medications on File Prior to Visit  Medication Sig Dispense Refill  . albuterol (PROVENTIL) (2.5 MG/3ML) 0.083% nebulizer solution USE 1 VIAL IN NEBULIZER EVERY 4 HOURS AS NEEDED FOR  WHEEZING (Patient taking differently: Take 2.5 mg by nebulization every 4 (four) hours as needed for wheezing or shortness of breath. ) 225 vial 1  . budesonide-formoterol (SYMBICORT) 160-4.5 MCG/ACT inhaler INHALE 2 PUFFS INTO LUNGS TWICE A DAY (Patient taking differently: Inhale 2 puffs into the lungs 2 (two) times daily. ) 10.2 Inhaler 5  . cholecalciferol (VITAMIN D) 1000 UNITS tablet Take 4,000 Units by mouth daily.    . ciclopirox (LOPROX) 0.77 % cream APPLY TO AFFECTED AREA TWICE A DAY AS NEEDED (Patient taking differently: Apply 1 application topically 2 (two) times daily as needed (skin care). ) 90 g 0  . diclofenac (VOLTAREN) 75 MG EC tablet TAKE ONE TABLET BY MOUTH TWICE DAILY AS NEEDED FOR  PAIN  WITH  FOOD. (Patient taking differently: Take 75 mg by mouth 2 (two) times daily as needed for  mild pain. ) 180 tablet 0  . diltiazem (CARTIA XT) 120 MG 24 hr capsule Take 1 capsule (120 mg total) by mouth daily. 90 capsule 3  . furosemide (LASIX) 80 MG tablet Take 1 tablet (80 mg total) by mouth 2 (two) times daily. 180 tablet 0  . glipiZIDE (GLUCOTROL XL) 5 MG 24 hr tablet Take 1 tablet (5 mg total) by mouth daily with breakfast. 30 tablet 5  . glucose blood test strip Test blood sugar once daily and as needed for DM 250.0 100 each 3  . iron polysaccharides (NU-IRON) 150 MG capsule Take 1 capsule (150 mg total) by mouth daily. 30 capsule 0  . Lancets (ONETOUCH ULTRASOFT) lancets Test blood sugar once daily and as needed for DM 250.0     . levothyroxine (SYNTHROID, LEVOTHROID) 175 MCG tablet Take 1 tablet (175 mcg total) by mouth daily before breakfast. 30 tablet 5  . metFORMIN (GLUCOPHAGE) 1000 MG tablet TAKE 1 TABLET BY MOUTH TWICE DAILY WITH A MEAL (Patient taking differently: Take 1,000 mg by mouth 2 (two) times daily with a meal. ) 180 tablet 0  . mometasone (ELOCON) 0.1 % cream Apply 1 application topically See admin instructions. Tiny amount to each ear canal twice weekly as needed.    . mupirocin ointment (BACTROBAN) 2 % APPLY TO AFFECTED AREA TWICE A DAY (Patient taking differently: Apply 1 application topically 2 (two) times daily. ) 22 g 4  . pantoprazole (PROTONIX) 40 MG tablet Take 1 tablet (40 mg total) by mouth daily at 6 (six) AM. 30 tablet 0  . potassium chloride SA (KLOR-CON M20) 20 MEQ tablet TAKE 2 TABLETS BY MOUTH EVERY DAY (Patient taking differently: Take 40 mEq by mouth daily. ) 180 tablet 0  . simvastatin (ZOCOR) 20 MG tablet Take 1 tablet (20 mg total) by mouth at bedtime. 90 tablet 0  . traMADol (ULTRAM) 50 MG tablet Take 1 tablet (50 mg total) by mouth every 12 (twelve) hours as needed for moderate pain or severe pain. Caution of sedation/falls and constipation 60 tablet 0  . warfarin (COUMADIN) 5 MG tablet Take 1-1.5 tablets (5-7.5 mg total) by mouth daily at 6 PM.  Take 1.5  tablets (7.5mg ) today, then 1 tablet ( ) daily then AS DIRECTED BY THE ANTICOAGULATION CLINIC. 30 tablet 0   No current facility-administered medications on file prior to visit.     Observations/Objective: Pt sounds like her normal self , more energetic today Good mood and talkative but mildly anxious (voices this) Not distressed or tearful No sob with speech / no wheezing or cough     Assessment and Plan: Problem List Items Addressed This Visit      Cardiovascular and Mediastinum   Atrial fibrillation (HCC) (Chronic)    Pt has been in NSR for some time  No cardiac f/u in years  Rate controlled with diltiazem  Pt takes coumadin for her h/o pulm embolus (not the afib)         Chronic pulmonary heart disease (HCC)    Cor pulmonale From past PE Is on lifelong coumadin (per Dr Shelle Iron in 09- or until she can loose weight)  Now has been found to be severely anemic (? Due to bleeding from thigh wounds)  I would ultimately like to get her off of the warfarin if possible  Goal is to get her well enough to f/u with pulmonary for this and obesity hypoventilation syndrome and reactive airway dz   (02 dep)        Respiratory   Obesity hypoventilation syndrome (HCC)    Wears 02 4L by [place] This was stable in hospital  Less sob since she had transfusion of blood and iron  Understands need to loose weight       Reactive airway disease    Continues neb tx  Goal to return to pulmonary once mobile /well enough      OSA (obstructive sleep apnea)    Working on order for cpap and home sleep study before she was admitted to the hospital  Goal is to get her well enough to f/u with pulmonary        Digestive   GERD    Continues protonix Recent severe anemia with clear EGD Pt insists on continuing diclofenac (understands risks)  No c/o of symptoms        Endocrine   Diabetes type 2, controlled (HCC)    Stable Lab Results  Component Value Date   HGBA1C 5.7 12/03/2017    Goal to get her well enough/mobile enough to come into the office for reg visits and weight checks         Hyperparathyroidism (HCC)    Lab Results  Component Value Date   CALCIUM 11.1 (H) 01/14/2019   PHOS 2.5 12/03/2017   Pt has been unable to see surgeon due to financial and mobility issues         Musculoskeletal and Integument   Pressure injury of skin    Bilateral thigh wounds - ? Cause of her bleeding and profound anemia in morbidly obese pt  These are from friction of scooting in a chair and bleed easily since pt is on coumadin  Per pt since hospitalization and home nurse visits she is much improved with less bleeding  Skilled nursing will continue to dress with colloid dressing and use protection for friction when possible        Other   PULMONARY EMBOLISM, HX OF    Pt continues to take warfarin for severe PE that caused cor pulmonale in the beginning  She is 02 dependent (also due to obestity hypoventilation syndrome)       Mobility impaired    Reviewed  hospital records, lab results and studies in detail  Now that anemia is improved pt feels able to start working more on mobility  She is using a wheelchair when needed and PT will start back at home next week  Ultimate goal is to be able to leave household to get to DR visits (Millie her social worker is the contact)  Enc wt loss if able as well       Reactive depression (situational)    Stress-recent hosp and pandemic scare  Will inc prozac to 40 mg -disc poss side eff  Also to help anx/panic symptoms        Relevant Medications   FLUoxetine (PROZAC) 40 MG capsule   ALPRAZolam (XANAX) 0.5 MG tablet   Absolute anemia - Primary   Symptomatic anemia    Severe iron def anemia with Hb of 5.6 at presentation to the hospital  Reviewed hospital records, lab results and studies in detail  No source of bleeding found except chronic thigh wounds which pt states have bled constantly for months  Those are treated   Pt had 3 u transfusion and 2 doses of IV feraheme  She takes nu  Iron 150 mg daily now  Hb at dc was 7.5 - improving  Her energy level and sob on exertion are also improving  Goal is to tx orally /if unable would consider IV iron  Also tx wounds that are bleeding Would like to stop her coumadin if possible (taking for h/o PE with cor pulmonale in the setting of poor mobility/ morbid obesity)  When ready-would like to consult pulmonary re: this       Panic attacks    In addition to anxiety - since admission to the hospital  Will inc her prozac from 20 to 40 mg (disc side eff)  Also px short course of xanax to use for any panic attacks that last longer that 15 minutes  She has a good support network at home Reviewed stressors/ coping techniques/symptoms/ support sources/ tx options and side effects in detail today       Relevant Medications   FLUoxetine (PROZAC) 40 MG capsule   ALPRAZolam (XANAX) 0.5 MG tablet       Follow Up Instructions: Increase prozac to 40 mg    I discussed the assessment and treatment plan with the patient. The patient was provided an opportunity to ask questions and all were answered. The patient agreed with the plan and demonstrated an understanding of the instructions.   The patient was advised to call back or seek an in-person evaluation if the symptoms worsen or if the condition fails to improve as anticipated.  I provided 34 minutes of non-face-to-face time during this encounter.   Roxy MannsMarne , MD

## 2019-01-20 NOTE — Assessment & Plan Note (Signed)
In addition to anxiety - since admission to the hospital  Will inc her prozac from 20 to 40 mg (disc side eff)  Also px short course of xanax to use for any panic attacks that last longer that 15 minutes  She has a good support network at home Reviewed stressors/ coping techniques/symptoms/ support sources/ tx options and side effects in detail today

## 2019-01-20 NOTE — Assessment & Plan Note (Addendum)
Bilateral thigh wounds - ? Cause of her bleeding and profound anemia in morbidly obese pt  These are from friction of scooting in a chair and bleed easily since pt is on coumadin  Per pt since hospitalization and home nurse visits she is much improved with less bleeding  Skilled nursing will continue to dress with colloid dressing and use protection for friction when possible

## 2019-01-20 NOTE — Assessment & Plan Note (Signed)
Wears 02 4L by [place] This was stable in hospital  Less sob since she had transfusion of blood and iron  Understands need to loose weight

## 2019-01-20 NOTE — Assessment & Plan Note (Signed)
Working on order for cpap and home sleep study before she was admitted to the hospital  Goal is to get her well enough to f/u with pulmonary

## 2019-01-20 NOTE — Assessment & Plan Note (Signed)
Continues neb tx  Goal to return to pulmonary once mobile /well enough

## 2019-01-20 NOTE — Assessment & Plan Note (Signed)
Pt has been in NSR for some time  No cardiac f/u in years  Rate controlled with diltiazem  Pt takes coumadin for her h/o pulm embolus (not the afib)

## 2019-01-21 ENCOUNTER — Telehealth: Payer: Self-pay | Admitting: *Deleted

## 2019-01-21 DIAGNOSIS — E119 Type 2 diabetes mellitus without complications: Secondary | ICD-10-CM | POA: Diagnosis not present

## 2019-01-21 DIAGNOSIS — I279 Pulmonary heart disease, unspecified: Secondary | ICD-10-CM | POA: Diagnosis not present

## 2019-01-21 DIAGNOSIS — E7800 Pure hypercholesterolemia, unspecified: Secondary | ICD-10-CM | POA: Diagnosis not present

## 2019-01-21 DIAGNOSIS — S81801D Unspecified open wound, right lower leg, subsequent encounter: Secondary | ICD-10-CM | POA: Diagnosis not present

## 2019-01-21 DIAGNOSIS — E039 Hypothyroidism, unspecified: Secondary | ICD-10-CM | POA: Diagnosis not present

## 2019-01-21 DIAGNOSIS — L89891 Pressure ulcer of other site, stage 1: Secondary | ICD-10-CM | POA: Diagnosis not present

## 2019-01-21 DIAGNOSIS — I4891 Unspecified atrial fibrillation: Secondary | ICD-10-CM | POA: Diagnosis not present

## 2019-01-21 DIAGNOSIS — Z7984 Long term (current) use of oral hypoglycemic drugs: Secondary | ICD-10-CM | POA: Diagnosis not present

## 2019-01-21 DIAGNOSIS — E662 Morbid (severe) obesity with alveolar hypoventilation: Secondary | ICD-10-CM | POA: Diagnosis not present

## 2019-01-21 NOTE — Telephone Encounter (Signed)
Please ok that verbal order  

## 2019-01-21 NOTE — Telephone Encounter (Signed)
Will left a voicemail stating that they completed the OT evaluation on the patient. Will is requesting orders for OT for two times a week for two weeks. [phone]

## 2019-01-22 ENCOUNTER — Other Ambulatory Visit: Payer: Self-pay | Admitting: Family Medicine

## 2019-01-22 DIAGNOSIS — E662 Morbid (severe) obesity with alveolar hypoventilation: Secondary | ICD-10-CM | POA: Diagnosis not present

## 2019-01-22 DIAGNOSIS — E119 Type 2 diabetes mellitus without complications: Secondary | ICD-10-CM | POA: Diagnosis not present

## 2019-01-22 DIAGNOSIS — S81801D Unspecified open wound, right lower leg, subsequent encounter: Secondary | ICD-10-CM | POA: Diagnosis not present

## 2019-01-22 DIAGNOSIS — E039 Hypothyroidism, unspecified: Secondary | ICD-10-CM | POA: Diagnosis not present

## 2019-01-22 DIAGNOSIS — Z7984 Long term (current) use of oral hypoglycemic drugs: Secondary | ICD-10-CM | POA: Diagnosis not present

## 2019-01-22 DIAGNOSIS — I279 Pulmonary heart disease, unspecified: Secondary | ICD-10-CM | POA: Diagnosis not present

## 2019-01-22 DIAGNOSIS — I4891 Unspecified atrial fibrillation: Secondary | ICD-10-CM | POA: Diagnosis not present

## 2019-01-22 DIAGNOSIS — L89891 Pressure ulcer of other site, stage 1: Secondary | ICD-10-CM | POA: Diagnosis not present

## 2019-01-22 DIAGNOSIS — E7800 Pure hypercholesterolemia, unspecified: Secondary | ICD-10-CM | POA: Diagnosis not present

## 2019-01-22 NOTE — Telephone Encounter (Signed)
Gave the approval for the verbal order. 

## 2019-01-23 ENCOUNTER — Ambulatory Visit (INDEPENDENT_AMBULATORY_CARE_PROVIDER_SITE_OTHER): Payer: Medicare HMO

## 2019-01-23 ENCOUNTER — Telehealth: Payer: Self-pay | Admitting: Family Medicine

## 2019-01-23 DIAGNOSIS — E039 Hypothyroidism, unspecified: Secondary | ICD-10-CM | POA: Diagnosis not present

## 2019-01-23 DIAGNOSIS — L89891 Pressure ulcer of other site, stage 1: Secondary | ICD-10-CM | POA: Diagnosis not present

## 2019-01-23 DIAGNOSIS — Z86711 Personal history of pulmonary embolism: Secondary | ICD-10-CM

## 2019-01-23 DIAGNOSIS — Z5181 Encounter for therapeutic drug level monitoring: Secondary | ICD-10-CM

## 2019-01-23 DIAGNOSIS — E662 Morbid (severe) obesity with alveolar hypoventilation: Secondary | ICD-10-CM | POA: Diagnosis not present

## 2019-01-23 DIAGNOSIS — E119 Type 2 diabetes mellitus without complications: Secondary | ICD-10-CM | POA: Diagnosis not present

## 2019-01-23 DIAGNOSIS — I4891 Unspecified atrial fibrillation: Secondary | ICD-10-CM | POA: Diagnosis not present

## 2019-01-23 DIAGNOSIS — Z7984 Long term (current) use of oral hypoglycemic drugs: Secondary | ICD-10-CM | POA: Diagnosis not present

## 2019-01-23 DIAGNOSIS — D509 Iron deficiency anemia, unspecified: Secondary | ICD-10-CM | POA: Diagnosis not present

## 2019-01-23 DIAGNOSIS — I4821 Permanent atrial fibrillation: Secondary | ICD-10-CM

## 2019-01-23 DIAGNOSIS — E213 Hyperparathyroidism, unspecified: Secondary | ICD-10-CM | POA: Diagnosis not present

## 2019-01-23 DIAGNOSIS — I279 Pulmonary heart disease, unspecified: Secondary | ICD-10-CM | POA: Diagnosis not present

## 2019-01-23 DIAGNOSIS — S81801D Unspecified open wound, right lower leg, subsequent encounter: Secondary | ICD-10-CM | POA: Diagnosis not present

## 2019-01-23 DIAGNOSIS — Z86718 Personal history of other venous thrombosis and embolism: Secondary | ICD-10-CM

## 2019-01-23 DIAGNOSIS — E7800 Pure hypercholesterolemia, unspecified: Secondary | ICD-10-CM | POA: Diagnosis not present

## 2019-01-23 LAB — POCT INR: INR: 2.1 (ref 2.0–3.0)

## 2019-01-23 NOTE — Telephone Encounter (Signed)
Excellent! Thanks [employer]

## 2019-01-23 NOTE — Telephone Encounter (Signed)
I spoke to [facility] and they did receive Dr.[name]'s orders to draw blood.  They will be doing it today.

## 2019-01-23 NOTE — Patient Instructions (Signed)
INR today 2.1 Continue taking prior dosing of 5mg  Sun, Tue, Thurs and 2.5mg  on Mond, Wed, Fri and Saturday recheck in 2 weeks.  Patient and home health nurse Ruffin Frederick made aware of instructions. Will continue to monitor closely following recent hospitalization.    Patient reports that she is feeling much better at this time and denies any unusual bruising or bleeding.

## 2019-01-23 NOTE — Telephone Encounter (Signed)
Name of Medication: Tramadol  Name of Pharmacy: [facility] [address]  Last Fill or Written Date and Quantity: 12/23/18 Amy Bryant  Last Office Visit and Type: Virtual visit 01/20/19 Next Office Visit and Type: None scheduled at this time Last Controlled Substance Agreement Date: Not on file Last UDS:Not on file

## 2019-01-26 ENCOUNTER — Telehealth: Payer: Self-pay | Admitting: Family Medicine

## 2019-01-26 DIAGNOSIS — Z7984 Long term (current) use of oral hypoglycemic drugs: Secondary | ICD-10-CM | POA: Diagnosis not present

## 2019-01-26 DIAGNOSIS — S81801D Unspecified open wound, right lower leg, subsequent encounter: Secondary | ICD-10-CM | POA: Diagnosis not present

## 2019-01-26 DIAGNOSIS — L89891 Pressure ulcer of other site, stage 1: Secondary | ICD-10-CM | POA: Diagnosis not present

## 2019-01-26 DIAGNOSIS — I4891 Unspecified atrial fibrillation: Secondary | ICD-10-CM | POA: Diagnosis not present

## 2019-01-26 DIAGNOSIS — I279 Pulmonary heart disease, unspecified: Secondary | ICD-10-CM | POA: Diagnosis not present

## 2019-01-26 DIAGNOSIS — E7800 Pure hypercholesterolemia, unspecified: Secondary | ICD-10-CM | POA: Diagnosis not present

## 2019-01-26 DIAGNOSIS — E119 Type 2 diabetes mellitus without complications: Secondary | ICD-10-CM | POA: Diagnosis not present

## 2019-01-26 DIAGNOSIS — E662 Morbid (severe) obesity with alveolar hypoventilation: Secondary | ICD-10-CM | POA: Diagnosis not present

## 2019-01-26 DIAGNOSIS — E039 Hypothyroidism, unspecified: Secondary | ICD-10-CM | POA: Diagnosis not present

## 2019-01-26 NOTE — Telephone Encounter (Signed)
Reviewed [facility] (draw from home care)  Chemistry levels/kidney function is stable  K is in the normal range  Calcium level remains mildly high (as is her baseline)   Her Hb (anemia) is much improved at 9.1 (up from 7.5 at d/c from hospital) Continue the iron please  I hope she continues to feel better -please update me  I would like to re check cbc in 2 weeks (lab order is in IN box for home care) Thanks

## 2019-01-27 DIAGNOSIS — E7800 Pure hypercholesterolemia, unspecified: Secondary | ICD-10-CM | POA: Diagnosis not present

## 2019-01-27 DIAGNOSIS — E039 Hypothyroidism, unspecified: Secondary | ICD-10-CM | POA: Diagnosis not present

## 2019-01-27 DIAGNOSIS — E662 Morbid (severe) obesity with alveolar hypoventilation: Secondary | ICD-10-CM | POA: Diagnosis not present

## 2019-01-27 DIAGNOSIS — I279 Pulmonary heart disease, unspecified: Secondary | ICD-10-CM | POA: Diagnosis not present

## 2019-01-27 DIAGNOSIS — I4891 Unspecified atrial fibrillation: Secondary | ICD-10-CM | POA: Diagnosis not present

## 2019-01-27 DIAGNOSIS — S81801D Unspecified open wound, right lower leg, subsequent encounter: Secondary | ICD-10-CM | POA: Diagnosis not present

## 2019-01-27 DIAGNOSIS — Z7984 Long term (current) use of oral hypoglycemic drugs: Secondary | ICD-10-CM | POA: Diagnosis not present

## 2019-01-27 DIAGNOSIS — L89891 Pressure ulcer of other site, stage 1: Secondary | ICD-10-CM | POA: Diagnosis not present

## 2019-01-27 DIAGNOSIS — E119 Type 2 diabetes mellitus without complications: Secondary | ICD-10-CM | POA: Diagnosis not present

## 2019-01-27 NOTE — Telephone Encounter (Signed)
Spoke to pt. She is feeling better. 

## 2019-01-28 DIAGNOSIS — Z7984 Long term (current) use of oral hypoglycemic drugs: Secondary | ICD-10-CM | POA: Diagnosis not present

## 2019-01-28 DIAGNOSIS — L89891 Pressure ulcer of other site, stage 1: Secondary | ICD-10-CM | POA: Diagnosis not present

## 2019-01-28 DIAGNOSIS — E039 Hypothyroidism, unspecified: Secondary | ICD-10-CM | POA: Diagnosis not present

## 2019-01-28 DIAGNOSIS — S81801D Unspecified open wound, right lower leg, subsequent encounter: Secondary | ICD-10-CM | POA: Diagnosis not present

## 2019-01-28 DIAGNOSIS — I279 Pulmonary heart disease, unspecified: Secondary | ICD-10-CM | POA: Diagnosis not present

## 2019-01-28 DIAGNOSIS — E119 Type 2 diabetes mellitus without complications: Secondary | ICD-10-CM | POA: Diagnosis not present

## 2019-01-28 DIAGNOSIS — I4891 Unspecified atrial fibrillation: Secondary | ICD-10-CM | POA: Diagnosis not present

## 2019-01-28 DIAGNOSIS — E662 Morbid (severe) obesity with alveolar hypoventilation: Secondary | ICD-10-CM | POA: Diagnosis not present

## 2019-01-28 DIAGNOSIS — E7800 Pure hypercholesterolemia, unspecified: Secondary | ICD-10-CM | POA: Diagnosis not present

## 2019-01-30 ENCOUNTER — Telehealth: Payer: Self-pay | Admitting: Family Medicine

## 2019-01-30 ENCOUNTER — Other Ambulatory Visit: Payer: Self-pay | Admitting: Family Medicine

## 2019-01-30 DIAGNOSIS — E662 Morbid (severe) obesity with alveolar hypoventilation: Secondary | ICD-10-CM | POA: Diagnosis not present

## 2019-01-30 DIAGNOSIS — I480 Paroxysmal atrial fibrillation: Secondary | ICD-10-CM | POA: Diagnosis not present

## 2019-01-30 DIAGNOSIS — J449 Chronic obstructive pulmonary disease, unspecified: Secondary | ICD-10-CM | POA: Diagnosis not present

## 2019-01-30 DIAGNOSIS — E039 Hypothyroidism, unspecified: Secondary | ICD-10-CM | POA: Diagnosis not present

## 2019-01-30 DIAGNOSIS — D509 Iron deficiency anemia, unspecified: Secondary | ICD-10-CM | POA: Diagnosis not present

## 2019-01-30 DIAGNOSIS — L89891 Pressure ulcer of other site, stage 1: Secondary | ICD-10-CM | POA: Diagnosis not present

## 2019-01-30 DIAGNOSIS — I5032 Chronic diastolic (congestive) heart failure: Secondary | ICD-10-CM | POA: Diagnosis not present

## 2019-01-30 DIAGNOSIS — I279 Pulmonary heart disease, unspecified: Secondary | ICD-10-CM | POA: Diagnosis not present

## 2019-01-30 DIAGNOSIS — J9611 Chronic respiratory failure with hypoxia: Secondary | ICD-10-CM | POA: Diagnosis not present

## 2019-01-30 NOTE — Telephone Encounter (Signed)
error 

## 2019-01-30 NOTE — Telephone Encounter (Signed)
Last OV 01/20/2019  Last refilled  90 capsule 3 12/03/2017     Next OV none scheduled   Sent to PCP for approval

## 2019-02-02 DIAGNOSIS — L89891 Pressure ulcer of other site, stage 1: Secondary | ICD-10-CM | POA: Diagnosis not present

## 2019-02-02 DIAGNOSIS — D509 Iron deficiency anemia, unspecified: Secondary | ICD-10-CM | POA: Diagnosis not present

## 2019-02-02 DIAGNOSIS — J449 Chronic obstructive pulmonary disease, unspecified: Secondary | ICD-10-CM | POA: Diagnosis not present

## 2019-02-02 DIAGNOSIS — J9611 Chronic respiratory failure with hypoxia: Secondary | ICD-10-CM | POA: Diagnosis not present

## 2019-02-02 DIAGNOSIS — I279 Pulmonary heart disease, unspecified: Secondary | ICD-10-CM | POA: Diagnosis not present

## 2019-02-02 DIAGNOSIS — E662 Morbid (severe) obesity with alveolar hypoventilation: Secondary | ICD-10-CM | POA: Diagnosis not present

## 2019-02-02 DIAGNOSIS — I480 Paroxysmal atrial fibrillation: Secondary | ICD-10-CM | POA: Diagnosis not present

## 2019-02-02 DIAGNOSIS — I5032 Chronic diastolic (congestive) heart failure: Secondary | ICD-10-CM | POA: Diagnosis not present

## 2019-02-02 DIAGNOSIS — E039 Hypothyroidism, unspecified: Secondary | ICD-10-CM | POA: Diagnosis not present

## 2019-02-03 DIAGNOSIS — I279 Pulmonary heart disease, unspecified: Secondary | ICD-10-CM | POA: Diagnosis not present

## 2019-02-03 DIAGNOSIS — L89891 Pressure ulcer of other site, stage 1: Secondary | ICD-10-CM | POA: Diagnosis not present

## 2019-02-03 DIAGNOSIS — E662 Morbid (severe) obesity with alveolar hypoventilation: Secondary | ICD-10-CM | POA: Diagnosis not present

## 2019-02-03 DIAGNOSIS — J9611 Chronic respiratory failure with hypoxia: Secondary | ICD-10-CM | POA: Diagnosis not present

## 2019-02-03 DIAGNOSIS — E039 Hypothyroidism, unspecified: Secondary | ICD-10-CM | POA: Diagnosis not present

## 2019-02-03 DIAGNOSIS — I5032 Chronic diastolic (congestive) heart failure: Secondary | ICD-10-CM | POA: Diagnosis not present

## 2019-02-03 DIAGNOSIS — J449 Chronic obstructive pulmonary disease, unspecified: Secondary | ICD-10-CM | POA: Diagnosis not present

## 2019-02-03 DIAGNOSIS — D509 Iron deficiency anemia, unspecified: Secondary | ICD-10-CM | POA: Diagnosis not present

## 2019-02-03 DIAGNOSIS — I480 Paroxysmal atrial fibrillation: Secondary | ICD-10-CM | POA: Diagnosis not present

## 2019-02-04 ENCOUNTER — Telehealth: Payer: Self-pay

## 2019-02-04 DIAGNOSIS — E662 Morbid (severe) obesity with alveolar hypoventilation: Secondary | ICD-10-CM | POA: Diagnosis not present

## 2019-02-04 DIAGNOSIS — J449 Chronic obstructive pulmonary disease, unspecified: Secondary | ICD-10-CM | POA: Diagnosis not present

## 2019-02-04 DIAGNOSIS — I5032 Chronic diastolic (congestive) heart failure: Secondary | ICD-10-CM | POA: Diagnosis not present

## 2019-02-04 DIAGNOSIS — D509 Iron deficiency anemia, unspecified: Secondary | ICD-10-CM | POA: Diagnosis not present

## 2019-02-04 DIAGNOSIS — L89891 Pressure ulcer of other site, stage 1: Secondary | ICD-10-CM | POA: Diagnosis not present

## 2019-02-04 DIAGNOSIS — J9611 Chronic respiratory failure with hypoxia: Secondary | ICD-10-CM | POA: Diagnosis not present

## 2019-02-04 DIAGNOSIS — E039 Hypothyroidism, unspecified: Secondary | ICD-10-CM | POA: Diagnosis not present

## 2019-02-04 DIAGNOSIS — I279 Pulmonary heart disease, unspecified: Secondary | ICD-10-CM | POA: Diagnosis not present

## 2019-02-04 DIAGNOSIS — I480 Paroxysmal atrial fibrillation: Secondary | ICD-10-CM | POA: Diagnosis not present

## 2019-02-04 NOTE — Telephone Encounter (Signed)
Please ok those verbal orders  

## 2019-02-04 NOTE — Telephone Encounter (Signed)
Gave the approval for the verbal orders 

## 2019-02-04 NOTE — Telephone Encounter (Signed)
Will Schaller OT with [facility] left v/m requesting to extend [facility] OT after recertification completed 2 x a wk for 4 wks.

## 2019-02-05 DIAGNOSIS — I480 Paroxysmal atrial fibrillation: Secondary | ICD-10-CM | POA: Diagnosis not present

## 2019-02-05 DIAGNOSIS — I5032 Chronic diastolic (congestive) heart failure: Secondary | ICD-10-CM | POA: Diagnosis not present

## 2019-02-05 DIAGNOSIS — D509 Iron deficiency anemia, unspecified: Secondary | ICD-10-CM | POA: Diagnosis not present

## 2019-02-05 DIAGNOSIS — L89891 Pressure ulcer of other site, stage 1: Secondary | ICD-10-CM | POA: Diagnosis not present

## 2019-02-05 DIAGNOSIS — I279 Pulmonary heart disease, unspecified: Secondary | ICD-10-CM | POA: Diagnosis not present

## 2019-02-05 DIAGNOSIS — J449 Chronic obstructive pulmonary disease, unspecified: Secondary | ICD-10-CM | POA: Diagnosis not present

## 2019-02-05 DIAGNOSIS — J9611 Chronic respiratory failure with hypoxia: Secondary | ICD-10-CM | POA: Diagnosis not present

## 2019-02-05 DIAGNOSIS — E039 Hypothyroidism, unspecified: Secondary | ICD-10-CM | POA: Diagnosis not present

## 2019-02-05 DIAGNOSIS — E662 Morbid (severe) obesity with alveolar hypoventilation: Secondary | ICD-10-CM | POA: Diagnosis not present

## 2019-02-06 ENCOUNTER — Other Ambulatory Visit: Payer: Self-pay | Admitting: Family Medicine

## 2019-02-06 ENCOUNTER — Telehealth: Payer: Self-pay

## 2019-02-06 ENCOUNTER — Ambulatory Visit (INDEPENDENT_AMBULATORY_CARE_PROVIDER_SITE_OTHER): Payer: Medicare HMO

## 2019-02-06 ENCOUNTER — Telehealth: Payer: Self-pay | Admitting: *Deleted

## 2019-02-06 DIAGNOSIS — Z86718 Personal history of other venous thrombosis and embolism: Secondary | ICD-10-CM

## 2019-02-06 DIAGNOSIS — Z86711 Personal history of pulmonary embolism: Secondary | ICD-10-CM

## 2019-02-06 DIAGNOSIS — Z5181 Encounter for therapeutic drug level monitoring: Secondary | ICD-10-CM | POA: Diagnosis not present

## 2019-02-06 DIAGNOSIS — I279 Pulmonary heart disease, unspecified: Secondary | ICD-10-CM | POA: Diagnosis not present

## 2019-02-06 DIAGNOSIS — J449 Chronic obstructive pulmonary disease, unspecified: Secondary | ICD-10-CM | POA: Diagnosis not present

## 2019-02-06 DIAGNOSIS — E662 Morbid (severe) obesity with alveolar hypoventilation: Secondary | ICD-10-CM | POA: Diagnosis not present

## 2019-02-06 DIAGNOSIS — I4821 Permanent atrial fibrillation: Secondary | ICD-10-CM

## 2019-02-06 DIAGNOSIS — D509 Iron deficiency anemia, unspecified: Secondary | ICD-10-CM | POA: Diagnosis not present

## 2019-02-06 DIAGNOSIS — L89891 Pressure ulcer of other site, stage 1: Secondary | ICD-10-CM | POA: Diagnosis not present

## 2019-02-06 DIAGNOSIS — I5032 Chronic diastolic (congestive) heart failure: Secondary | ICD-10-CM | POA: Diagnosis not present

## 2019-02-06 DIAGNOSIS — J9611 Chronic respiratory failure with hypoxia: Secondary | ICD-10-CM | POA: Diagnosis not present

## 2019-02-06 DIAGNOSIS — E039 Hypothyroidism, unspecified: Secondary | ICD-10-CM | POA: Diagnosis not present

## 2019-02-06 DIAGNOSIS — I480 Paroxysmal atrial fibrillation: Secondary | ICD-10-CM | POA: Diagnosis not present

## 2019-02-06 LAB — POCT INR: INR: 1.9 — AB (ref 2.0–3.0)

## 2019-02-06 MED ORDER — PANTOPRAZOLE SODIUM 40 MG PO TBEC: 40.0000 mg | DELAYED_RELEASE_TABLET | Freq: Every day | ORAL | 3 refills | Status: DC

## 2019-02-06 MED ORDER — POLYSACCHARIDE IRON COMPLEX 150 MG PO CAPS: 150.0000 mg | ORAL_CAPSULE | Freq: Every day | ORAL | 3 refills | Status: DC

## 2019-02-06 NOTE — Telephone Encounter (Signed)
Will route to Dr. [name] for approval since she has never filled medications before, they were given at last hospital stay, please advise

## 2019-02-06 NOTE — Telephone Encounter (Signed)
Copied from CRM [id number]. Topic: Quick Communication - See Telephone Encounter >> Feb 05, 2019  4:50 PM Aretta Nip wrote: CRM for notification. See Telephone encounter for: 02/05/19. After hours  [facility] called Amy Bryant) regarding a Home Sleep test for pt. You may speak to anyone but they need a call back regarding the testing conditions. The reference # is [id number]. The number to reach out is [phone]

## 2019-02-06 NOTE — Telephone Encounter (Signed)
Amy Bryant with [facility] left a voicemail advising that she went out and checked patient's PT/INR today INR 1.9 PT 31.3 Patient is taking 5 mg Coumadin three days a week 2 1/2 coumadin 4 days a week

## 2019-02-06 NOTE — Patient Instructions (Signed)
INR today 1.9 Take 5mg  today (02/06/19) and then Continue taking prior dosing of 5mg  Sun, Tue, Thurs and 2.5mg  on Mond, Wed, Fri and Saturday recheck in 2 weeks.  Patient and home health nurse Victorino Dike made aware of instructions. Will continue to monitor closely following recent hospitalization.    Patient reports that she is feeling much better at this time and denies any unusual bruising or bleeding.

## 2019-02-06 NOTE — Telephone Encounter (Signed)
Notified patient and home health nurse Victorino Dike of instructions. Please refer to coag encounter note for full details.

## 2019-02-06 NOTE — Telephone Encounter (Signed)
Best number [phone]  Pt needs refills on  Poly iron 150mg  capsule take once daily  Pantoprazole sod dr 40mg  take one daily  [facility]  Pt a few pills left

## 2019-02-06 NOTE — Telephone Encounter (Signed)
Called [facility] and they wanted to make sure that it was okay for them to do the sleep study with pt's oxygen still on, they said it wasn't ordered stating that they want her to keep the O2 on so they just needed verbal okay to let her do it with the O2. I did give them the verbal okay due to pt's O2 stats dropping really low when she isn't on O2, I advise them that it could be dangerous to pt to have her sleep without the O2 all night. They were okay with changing the order and they will contact pt to set up the sleep study with her keeping her O2 on all night. Nothing further needs to be done.  FYI to Dr. [name]

## 2019-02-06 NOTE — Telephone Encounter (Signed)
Yes-I want her to leave 02 on  Thanks

## 2019-02-10 DIAGNOSIS — I279 Pulmonary heart disease, unspecified: Secondary | ICD-10-CM | POA: Diagnosis not present

## 2019-02-10 DIAGNOSIS — I480 Paroxysmal atrial fibrillation: Secondary | ICD-10-CM | POA: Diagnosis not present

## 2019-02-10 DIAGNOSIS — J9611 Chronic respiratory failure with hypoxia: Secondary | ICD-10-CM | POA: Diagnosis not present

## 2019-02-10 DIAGNOSIS — L89891 Pressure ulcer of other site, stage 1: Secondary | ICD-10-CM | POA: Diagnosis not present

## 2019-02-10 DIAGNOSIS — R6889 Other general symptoms and signs: Secondary | ICD-10-CM | POA: Diagnosis not present

## 2019-02-10 DIAGNOSIS — E662 Morbid (severe) obesity with alveolar hypoventilation: Secondary | ICD-10-CM | POA: Diagnosis not present

## 2019-02-10 DIAGNOSIS — D509 Iron deficiency anemia, unspecified: Secondary | ICD-10-CM | POA: Diagnosis not present

## 2019-02-10 DIAGNOSIS — E039 Hypothyroidism, unspecified: Secondary | ICD-10-CM | POA: Diagnosis not present

## 2019-02-10 DIAGNOSIS — I5032 Chronic diastolic (congestive) heart failure: Secondary | ICD-10-CM | POA: Diagnosis not present

## 2019-02-10 DIAGNOSIS — J449 Chronic obstructive pulmonary disease, unspecified: Secondary | ICD-10-CM | POA: Diagnosis not present

## 2019-02-11 ENCOUNTER — Telehealth: Payer: Self-pay | Admitting: Family Medicine

## 2019-02-11 DIAGNOSIS — M6281 Muscle weakness (generalized): Secondary | ICD-10-CM

## 2019-02-11 DIAGNOSIS — Z6841 Body Mass Index (BMI) 40.0 and over, adult: Secondary | ICD-10-CM

## 2019-02-11 DIAGNOSIS — I279 Pulmonary heart disease, unspecified: Secondary | ICD-10-CM | POA: Diagnosis not present

## 2019-02-11 DIAGNOSIS — R262 Difficulty in walking, not elsewhere classified: Secondary | ICD-10-CM

## 2019-02-11 DIAGNOSIS — E7800 Pure hypercholesterolemia, unspecified: Secondary | ICD-10-CM

## 2019-02-11 DIAGNOSIS — Z5181 Encounter for therapeutic drug level monitoring: Secondary | ICD-10-CM

## 2019-02-11 DIAGNOSIS — J449 Chronic obstructive pulmonary disease, unspecified: Secondary | ICD-10-CM | POA: Diagnosis not present

## 2019-02-11 DIAGNOSIS — J9611 Chronic respiratory failure with hypoxia: Secondary | ICD-10-CM | POA: Diagnosis not present

## 2019-02-11 DIAGNOSIS — E039 Hypothyroidism, unspecified: Secondary | ICD-10-CM | POA: Diagnosis not present

## 2019-02-11 DIAGNOSIS — Z7901 Long term (current) use of anticoagulants: Secondary | ICD-10-CM

## 2019-02-11 DIAGNOSIS — E119 Type 2 diabetes mellitus without complications: Secondary | ICD-10-CM

## 2019-02-11 DIAGNOSIS — E662 Morbid (severe) obesity with alveolar hypoventilation: Secondary | ICD-10-CM | POA: Diagnosis not present

## 2019-02-11 DIAGNOSIS — Z7984 Long term (current) use of oral hypoglycemic drugs: Secondary | ICD-10-CM

## 2019-02-11 DIAGNOSIS — I5032 Chronic diastolic (congestive) heart failure: Secondary | ICD-10-CM | POA: Diagnosis not present

## 2019-02-11 DIAGNOSIS — L89891 Pressure ulcer of other site, stage 1: Secondary | ICD-10-CM | POA: Diagnosis not present

## 2019-02-11 DIAGNOSIS — Z9981 Dependence on supplemental oxygen: Secondary | ICD-10-CM

## 2019-02-11 DIAGNOSIS — I480 Paroxysmal atrial fibrillation: Secondary | ICD-10-CM | POA: Diagnosis not present

## 2019-02-11 DIAGNOSIS — D509 Iron deficiency anemia, unspecified: Secondary | ICD-10-CM | POA: Diagnosis not present

## 2019-02-11 DIAGNOSIS — M150 Primary generalized (osteo)arthritis: Secondary | ICD-10-CM

## 2019-02-11 NOTE — Telephone Encounter (Signed)
Reviewing labs from home care draw 02/10/19 Much improvement   Hb is up to 10.6 (from 7.5 at hosp d/c)  Wbc is normal as are platelets  Ferritin (iron stores) up from 4 to 21  Excellent   I recommend she continue iron as she is taking it  I would like to repeat these labs in 1 month (cbc and ferritin)   Please ask how her wounds are- any more bleeding?  Also how she is feeling in general (fatigue and sob)  Please let me know   Order for 1 mo labs to fax is in IN box

## 2019-02-12 DIAGNOSIS — J9611 Chronic respiratory failure with hypoxia: Secondary | ICD-10-CM | POA: Diagnosis not present

## 2019-02-12 DIAGNOSIS — I279 Pulmonary heart disease, unspecified: Secondary | ICD-10-CM | POA: Diagnosis not present

## 2019-02-12 DIAGNOSIS — E039 Hypothyroidism, unspecified: Secondary | ICD-10-CM | POA: Diagnosis not present

## 2019-02-12 DIAGNOSIS — I5032 Chronic diastolic (congestive) heart failure: Secondary | ICD-10-CM | POA: Diagnosis not present

## 2019-02-12 DIAGNOSIS — J449 Chronic obstructive pulmonary disease, unspecified: Secondary | ICD-10-CM | POA: Diagnosis not present

## 2019-02-12 DIAGNOSIS — L89891 Pressure ulcer of other site, stage 1: Secondary | ICD-10-CM | POA: Diagnosis not present

## 2019-02-12 DIAGNOSIS — E662 Morbid (severe) obesity with alveolar hypoventilation: Secondary | ICD-10-CM | POA: Diagnosis not present

## 2019-02-12 DIAGNOSIS — D509 Iron deficiency anemia, unspecified: Secondary | ICD-10-CM | POA: Diagnosis not present

## 2019-02-12 DIAGNOSIS — I480 Paroxysmal atrial fibrillation: Secondary | ICD-10-CM | POA: Diagnosis not present

## 2019-02-12 NOTE — Telephone Encounter (Signed)
Called and pt was sleeping, she will call once she wakes up, if not I will try back later

## 2019-02-13 DIAGNOSIS — I279 Pulmonary heart disease, unspecified: Secondary | ICD-10-CM | POA: Diagnosis not present

## 2019-02-13 DIAGNOSIS — J9611 Chronic respiratory failure with hypoxia: Secondary | ICD-10-CM | POA: Diagnosis not present

## 2019-02-13 DIAGNOSIS — I5032 Chronic diastolic (congestive) heart failure: Secondary | ICD-10-CM | POA: Diagnosis not present

## 2019-02-13 DIAGNOSIS — E039 Hypothyroidism, unspecified: Secondary | ICD-10-CM | POA: Diagnosis not present

## 2019-02-13 DIAGNOSIS — E662 Morbid (severe) obesity with alveolar hypoventilation: Secondary | ICD-10-CM | POA: Diagnosis not present

## 2019-02-13 DIAGNOSIS — I480 Paroxysmal atrial fibrillation: Secondary | ICD-10-CM | POA: Diagnosis not present

## 2019-02-13 DIAGNOSIS — D509 Iron deficiency anemia, unspecified: Secondary | ICD-10-CM | POA: Diagnosis not present

## 2019-02-13 DIAGNOSIS — L89891 Pressure ulcer of other site, stage 1: Secondary | ICD-10-CM | POA: Diagnosis not present

## 2019-02-13 DIAGNOSIS — J449 Chronic obstructive pulmonary disease, unspecified: Secondary | ICD-10-CM | POA: Diagnosis not present

## 2019-02-13 NOTE — Telephone Encounter (Signed)
Pt notified of lab results and Dr. Royden Purl comments.   Pt said she is still having some bleeding from her wounds but the [facility]. nurse is keeping an eye on them and rebandage them up today and they stopped bleeding. Pt said since being d/c from the hospital she feels 90% better she said the fatigue is a lot better and the SOB is back to baseline. Overall pt feels very good!  Orders faxed to home health agency so they can recheck everything in one month.

## 2019-02-13 NOTE — Telephone Encounter (Signed)
Great! Thanks for the update.

## 2019-02-16 DIAGNOSIS — J449 Chronic obstructive pulmonary disease, unspecified: Secondary | ICD-10-CM | POA: Diagnosis not present

## 2019-02-16 DIAGNOSIS — E662 Morbid (severe) obesity with alveolar hypoventilation: Secondary | ICD-10-CM | POA: Diagnosis not present

## 2019-02-16 DIAGNOSIS — I480 Paroxysmal atrial fibrillation: Secondary | ICD-10-CM | POA: Diagnosis not present

## 2019-02-16 DIAGNOSIS — I5032 Chronic diastolic (congestive) heart failure: Secondary | ICD-10-CM | POA: Diagnosis not present

## 2019-02-16 DIAGNOSIS — L89891 Pressure ulcer of other site, stage 1: Secondary | ICD-10-CM | POA: Diagnosis not present

## 2019-02-16 DIAGNOSIS — I279 Pulmonary heart disease, unspecified: Secondary | ICD-10-CM | POA: Diagnosis not present

## 2019-02-16 DIAGNOSIS — E039 Hypothyroidism, unspecified: Secondary | ICD-10-CM | POA: Diagnosis not present

## 2019-02-16 DIAGNOSIS — J9611 Chronic respiratory failure with hypoxia: Secondary | ICD-10-CM | POA: Diagnosis not present

## 2019-02-16 DIAGNOSIS — D509 Iron deficiency anemia, unspecified: Secondary | ICD-10-CM | POA: Diagnosis not present

## 2019-02-17 ENCOUNTER — Telehealth: Payer: Self-pay | Admitting: Family Medicine

## 2019-02-17 DIAGNOSIS — J449 Chronic obstructive pulmonary disease, unspecified: Secondary | ICD-10-CM | POA: Diagnosis not present

## 2019-02-17 DIAGNOSIS — I480 Paroxysmal atrial fibrillation: Secondary | ICD-10-CM | POA: Diagnosis not present

## 2019-02-17 DIAGNOSIS — I5032 Chronic diastolic (congestive) heart failure: Secondary | ICD-10-CM | POA: Diagnosis not present

## 2019-02-17 DIAGNOSIS — I279 Pulmonary heart disease, unspecified: Secondary | ICD-10-CM | POA: Diagnosis not present

## 2019-02-17 DIAGNOSIS — D509 Iron deficiency anemia, unspecified: Secondary | ICD-10-CM | POA: Diagnosis not present

## 2019-02-17 DIAGNOSIS — L89891 Pressure ulcer of other site, stage 1: Secondary | ICD-10-CM | POA: Diagnosis not present

## 2019-02-17 DIAGNOSIS — E662 Morbid (severe) obesity with alveolar hypoventilation: Secondary | ICD-10-CM | POA: Diagnosis not present

## 2019-02-17 DIAGNOSIS — J9611 Chronic respiratory failure with hypoxia: Secondary | ICD-10-CM | POA: Diagnosis not present

## 2019-02-17 DIAGNOSIS — E039 Hypothyroidism, unspecified: Secondary | ICD-10-CM | POA: Diagnosis not present

## 2019-02-17 NOTE — Telephone Encounter (Signed)
Aware-thanks for the heads up  If she develops pain or other problems please let me know

## 2019-02-17 NOTE — Telephone Encounter (Signed)
Best number [phone] Loraine Leriche @ [facility]  PT  Reporting fall Slide out of chair Saturday ems came to help her up No injuries no pain

## 2019-02-18 DIAGNOSIS — D509 Iron deficiency anemia, unspecified: Secondary | ICD-10-CM | POA: Diagnosis not present

## 2019-02-18 DIAGNOSIS — E039 Hypothyroidism, unspecified: Secondary | ICD-10-CM | POA: Diagnosis not present

## 2019-02-18 DIAGNOSIS — I279 Pulmonary heart disease, unspecified: Secondary | ICD-10-CM | POA: Diagnosis not present

## 2019-02-18 DIAGNOSIS — J9611 Chronic respiratory failure with hypoxia: Secondary | ICD-10-CM | POA: Diagnosis not present

## 2019-02-18 DIAGNOSIS — I480 Paroxysmal atrial fibrillation: Secondary | ICD-10-CM | POA: Diagnosis not present

## 2019-02-18 DIAGNOSIS — E662 Morbid (severe) obesity with alveolar hypoventilation: Secondary | ICD-10-CM | POA: Diagnosis not present

## 2019-02-18 DIAGNOSIS — L89891 Pressure ulcer of other site, stage 1: Secondary | ICD-10-CM | POA: Diagnosis not present

## 2019-02-18 DIAGNOSIS — I5032 Chronic diastolic (congestive) heart failure: Secondary | ICD-10-CM | POA: Diagnosis not present

## 2019-02-18 DIAGNOSIS — J449 Chronic obstructive pulmonary disease, unspecified: Secondary | ICD-10-CM | POA: Diagnosis not present

## 2019-02-19 NOTE — Telephone Encounter (Signed)
ERRONEOUS ENCOUNTER ENTERED IN ERROR

## 2019-02-20 ENCOUNTER — Telehealth: Payer: Self-pay | Admitting: *Deleted

## 2019-02-20 ENCOUNTER — Ambulatory Visit (INDEPENDENT_AMBULATORY_CARE_PROVIDER_SITE_OTHER): Payer: Medicare HMO

## 2019-02-20 DIAGNOSIS — I4821 Permanent atrial fibrillation: Secondary | ICD-10-CM

## 2019-02-20 DIAGNOSIS — Z5181 Encounter for therapeutic drug level monitoring: Secondary | ICD-10-CM | POA: Diagnosis not present

## 2019-02-20 DIAGNOSIS — Z86718 Personal history of other venous thrombosis and embolism: Secondary | ICD-10-CM

## 2019-02-20 DIAGNOSIS — Z86711 Personal history of pulmonary embolism: Secondary | ICD-10-CM

## 2019-02-20 DIAGNOSIS — E662 Morbid (severe) obesity with alveolar hypoventilation: Secondary | ICD-10-CM | POA: Diagnosis not present

## 2019-02-20 DIAGNOSIS — L89891 Pressure ulcer of other site, stage 1: Secondary | ICD-10-CM | POA: Diagnosis not present

## 2019-02-20 DIAGNOSIS — E039 Hypothyroidism, unspecified: Secondary | ICD-10-CM | POA: Diagnosis not present

## 2019-02-20 DIAGNOSIS — J449 Chronic obstructive pulmonary disease, unspecified: Secondary | ICD-10-CM | POA: Diagnosis not present

## 2019-02-20 DIAGNOSIS — J9611 Chronic respiratory failure with hypoxia: Secondary | ICD-10-CM | POA: Diagnosis not present

## 2019-02-20 DIAGNOSIS — D509 Iron deficiency anemia, unspecified: Secondary | ICD-10-CM | POA: Diagnosis not present

## 2019-02-20 DIAGNOSIS — I480 Paroxysmal atrial fibrillation: Secondary | ICD-10-CM | POA: Diagnosis not present

## 2019-02-20 DIAGNOSIS — I5032 Chronic diastolic (congestive) heart failure: Secondary | ICD-10-CM | POA: Diagnosis not present

## 2019-02-20 DIAGNOSIS — I279 Pulmonary heart disease, unspecified: Secondary | ICD-10-CM | POA: Diagnosis not present

## 2019-02-20 LAB — POCT INR: INR: 1.6 — AB (ref 2.0–3.0)

## 2019-02-20 NOTE — Patient Instructions (Signed)
INR today 1.6  Take 5mg  today (02/20/19) and tomorrow (02/21/19) and then increase dose taking 5mg  daily EXCEPT for 2.5mg  on Mon, Wed ,Friday recheck in 2 weeks.  Patient and home health nurse made aware of instructions.   Patient reports that her general health has improved greatly since her hospitalization. She denies missing any doses.

## 2019-02-20 NOTE — Telephone Encounter (Signed)
Spoke with Selena Batten (home health nurse [facility]) LM on VM that I have already contacted patient with dosing instructions.  She is to take warfarin 5mg  today and tomorrow and then increase her weekly dose to 5mg  daily EXCEPT for 2.5mg  on Mon, Wed and Friday.  Recheck in 2 weeks.

## 2019-02-20 NOTE — Telephone Encounter (Signed)
Kim nurse with [facility] left a voicemail stating that she was calling to leave results PT/INR INR 1.6 PT 19.7  done today at 11:15.

## 2019-02-24 DIAGNOSIS — J449 Chronic obstructive pulmonary disease, unspecified: Secondary | ICD-10-CM | POA: Diagnosis not present

## 2019-02-24 DIAGNOSIS — E662 Morbid (severe) obesity with alveolar hypoventilation: Secondary | ICD-10-CM | POA: Diagnosis not present

## 2019-02-24 DIAGNOSIS — I279 Pulmonary heart disease, unspecified: Secondary | ICD-10-CM | POA: Diagnosis not present

## 2019-02-24 DIAGNOSIS — L89891 Pressure ulcer of other site, stage 1: Secondary | ICD-10-CM | POA: Diagnosis not present

## 2019-02-24 DIAGNOSIS — I480 Paroxysmal atrial fibrillation: Secondary | ICD-10-CM | POA: Diagnosis not present

## 2019-02-24 DIAGNOSIS — E039 Hypothyroidism, unspecified: Secondary | ICD-10-CM | POA: Diagnosis not present

## 2019-02-24 DIAGNOSIS — D509 Iron deficiency anemia, unspecified: Secondary | ICD-10-CM | POA: Diagnosis not present

## 2019-02-24 DIAGNOSIS — I5032 Chronic diastolic (congestive) heart failure: Secondary | ICD-10-CM | POA: Diagnosis not present

## 2019-02-24 DIAGNOSIS — J9611 Chronic respiratory failure with hypoxia: Secondary | ICD-10-CM | POA: Diagnosis not present

## 2019-02-25 DIAGNOSIS — L89891 Pressure ulcer of other site, stage 1: Secondary | ICD-10-CM | POA: Diagnosis not present

## 2019-02-25 DIAGNOSIS — E039 Hypothyroidism, unspecified: Secondary | ICD-10-CM | POA: Diagnosis not present

## 2019-02-25 DIAGNOSIS — I480 Paroxysmal atrial fibrillation: Secondary | ICD-10-CM | POA: Diagnosis not present

## 2019-02-25 DIAGNOSIS — I5032 Chronic diastolic (congestive) heart failure: Secondary | ICD-10-CM | POA: Diagnosis not present

## 2019-02-25 DIAGNOSIS — J9611 Chronic respiratory failure with hypoxia: Secondary | ICD-10-CM | POA: Diagnosis not present

## 2019-02-25 DIAGNOSIS — J449 Chronic obstructive pulmonary disease, unspecified: Secondary | ICD-10-CM | POA: Diagnosis not present

## 2019-02-25 DIAGNOSIS — E662 Morbid (severe) obesity with alveolar hypoventilation: Secondary | ICD-10-CM | POA: Diagnosis not present

## 2019-02-25 DIAGNOSIS — D509 Iron deficiency anemia, unspecified: Secondary | ICD-10-CM | POA: Diagnosis not present

## 2019-02-25 DIAGNOSIS — I279 Pulmonary heart disease, unspecified: Secondary | ICD-10-CM | POA: Diagnosis not present

## 2019-02-27 DIAGNOSIS — E662 Morbid (severe) obesity with alveolar hypoventilation: Secondary | ICD-10-CM | POA: Diagnosis not present

## 2019-02-27 DIAGNOSIS — I480 Paroxysmal atrial fibrillation: Secondary | ICD-10-CM | POA: Diagnosis not present

## 2019-02-27 DIAGNOSIS — I279 Pulmonary heart disease, unspecified: Secondary | ICD-10-CM | POA: Diagnosis not present

## 2019-02-27 DIAGNOSIS — J9611 Chronic respiratory failure with hypoxia: Secondary | ICD-10-CM | POA: Diagnosis not present

## 2019-02-27 DIAGNOSIS — I5032 Chronic diastolic (congestive) heart failure: Secondary | ICD-10-CM | POA: Diagnosis not present

## 2019-02-27 DIAGNOSIS — J449 Chronic obstructive pulmonary disease, unspecified: Secondary | ICD-10-CM | POA: Diagnosis not present

## 2019-02-27 DIAGNOSIS — E039 Hypothyroidism, unspecified: Secondary | ICD-10-CM | POA: Diagnosis not present

## 2019-02-27 DIAGNOSIS — D509 Iron deficiency anemia, unspecified: Secondary | ICD-10-CM | POA: Diagnosis not present

## 2019-02-27 DIAGNOSIS — L89891 Pressure ulcer of other site, stage 1: Secondary | ICD-10-CM | POA: Diagnosis not present

## 2019-03-02 ENCOUNTER — Telehealth: Payer: Self-pay | Admitting: *Deleted

## 2019-03-02 DIAGNOSIS — E039 Hypothyroidism, unspecified: Secondary | ICD-10-CM | POA: Diagnosis not present

## 2019-03-02 DIAGNOSIS — E662 Morbid (severe) obesity with alveolar hypoventilation: Secondary | ICD-10-CM | POA: Diagnosis not present

## 2019-03-02 DIAGNOSIS — J9611 Chronic respiratory failure with hypoxia: Secondary | ICD-10-CM | POA: Diagnosis not present

## 2019-03-02 DIAGNOSIS — J449 Chronic obstructive pulmonary disease, unspecified: Secondary | ICD-10-CM | POA: Diagnosis not present

## 2019-03-02 DIAGNOSIS — L89891 Pressure ulcer of other site, stage 1: Secondary | ICD-10-CM | POA: Diagnosis not present

## 2019-03-02 DIAGNOSIS — I480 Paroxysmal atrial fibrillation: Secondary | ICD-10-CM | POA: Diagnosis not present

## 2019-03-02 DIAGNOSIS — I279 Pulmonary heart disease, unspecified: Secondary | ICD-10-CM | POA: Diagnosis not present

## 2019-03-02 DIAGNOSIS — D509 Iron deficiency anemia, unspecified: Secondary | ICD-10-CM | POA: Diagnosis not present

## 2019-03-02 DIAGNOSIS — I5032 Chronic diastolic (congestive) heart failure: Secondary | ICD-10-CM | POA: Diagnosis not present

## 2019-03-02 NOTE — Telephone Encounter (Signed)
Tiffany with [facility] called stating that they are cancelling the order for a sleep study because patient is refusing to do the home study.

## 2019-03-02 NOTE — Telephone Encounter (Signed)
Aware, thanks!

## 2019-03-03 DIAGNOSIS — I279 Pulmonary heart disease, unspecified: Secondary | ICD-10-CM | POA: Diagnosis not present

## 2019-03-03 DIAGNOSIS — I5032 Chronic diastolic (congestive) heart failure: Secondary | ICD-10-CM | POA: Diagnosis not present

## 2019-03-03 DIAGNOSIS — J449 Chronic obstructive pulmonary disease, unspecified: Secondary | ICD-10-CM | POA: Diagnosis not present

## 2019-03-03 DIAGNOSIS — E662 Morbid (severe) obesity with alveolar hypoventilation: Secondary | ICD-10-CM | POA: Diagnosis not present

## 2019-03-03 DIAGNOSIS — J9611 Chronic respiratory failure with hypoxia: Secondary | ICD-10-CM | POA: Diagnosis not present

## 2019-03-03 DIAGNOSIS — E039 Hypothyroidism, unspecified: Secondary | ICD-10-CM | POA: Diagnosis not present

## 2019-03-03 DIAGNOSIS — I480 Paroxysmal atrial fibrillation: Secondary | ICD-10-CM | POA: Diagnosis not present

## 2019-03-03 DIAGNOSIS — L89891 Pressure ulcer of other site, stage 1: Secondary | ICD-10-CM | POA: Diagnosis not present

## 2019-03-03 DIAGNOSIS — D509 Iron deficiency anemia, unspecified: Secondary | ICD-10-CM | POA: Diagnosis not present

## 2019-03-04 DIAGNOSIS — I480 Paroxysmal atrial fibrillation: Secondary | ICD-10-CM | POA: Diagnosis not present

## 2019-03-04 DIAGNOSIS — I5032 Chronic diastolic (congestive) heart failure: Secondary | ICD-10-CM | POA: Diagnosis not present

## 2019-03-04 DIAGNOSIS — D509 Iron deficiency anemia, unspecified: Secondary | ICD-10-CM | POA: Diagnosis not present

## 2019-03-04 DIAGNOSIS — E039 Hypothyroidism, unspecified: Secondary | ICD-10-CM | POA: Diagnosis not present

## 2019-03-04 DIAGNOSIS — E662 Morbid (severe) obesity with alveolar hypoventilation: Secondary | ICD-10-CM | POA: Diagnosis not present

## 2019-03-04 DIAGNOSIS — L89891 Pressure ulcer of other site, stage 1: Secondary | ICD-10-CM | POA: Diagnosis not present

## 2019-03-04 DIAGNOSIS — I279 Pulmonary heart disease, unspecified: Secondary | ICD-10-CM | POA: Diagnosis not present

## 2019-03-04 DIAGNOSIS — J449 Chronic obstructive pulmonary disease, unspecified: Secondary | ICD-10-CM | POA: Diagnosis not present

## 2019-03-04 DIAGNOSIS — J9611 Chronic respiratory failure with hypoxia: Secondary | ICD-10-CM | POA: Diagnosis not present

## 2019-03-05 ENCOUNTER — Telehealth: Payer: Self-pay

## 2019-03-05 ENCOUNTER — Ambulatory Visit (INDEPENDENT_AMBULATORY_CARE_PROVIDER_SITE_OTHER): Payer: Medicare HMO

## 2019-03-05 DIAGNOSIS — J449 Chronic obstructive pulmonary disease, unspecified: Secondary | ICD-10-CM | POA: Diagnosis not present

## 2019-03-05 DIAGNOSIS — E662 Morbid (severe) obesity with alveolar hypoventilation: Secondary | ICD-10-CM | POA: Diagnosis not present

## 2019-03-05 DIAGNOSIS — Z86711 Personal history of pulmonary embolism: Secondary | ICD-10-CM

## 2019-03-05 DIAGNOSIS — I279 Pulmonary heart disease, unspecified: Secondary | ICD-10-CM | POA: Diagnosis not present

## 2019-03-05 DIAGNOSIS — D509 Iron deficiency anemia, unspecified: Secondary | ICD-10-CM | POA: Diagnosis not present

## 2019-03-05 DIAGNOSIS — E039 Hypothyroidism, unspecified: Secondary | ICD-10-CM | POA: Diagnosis not present

## 2019-03-05 DIAGNOSIS — Z86718 Personal history of other venous thrombosis and embolism: Secondary | ICD-10-CM

## 2019-03-05 DIAGNOSIS — Z5181 Encounter for therapeutic drug level monitoring: Secondary | ICD-10-CM

## 2019-03-05 DIAGNOSIS — I5032 Chronic diastolic (congestive) heart failure: Secondary | ICD-10-CM | POA: Diagnosis not present

## 2019-03-05 DIAGNOSIS — J9611 Chronic respiratory failure with hypoxia: Secondary | ICD-10-CM | POA: Diagnosis not present

## 2019-03-05 DIAGNOSIS — I480 Paroxysmal atrial fibrillation: Secondary | ICD-10-CM | POA: Diagnosis not present

## 2019-03-05 DIAGNOSIS — L89891 Pressure ulcer of other site, stage 1: Secondary | ICD-10-CM | POA: Diagnosis not present

## 2019-03-05 DIAGNOSIS — I4821 Permanent atrial fibrillation: Secondary | ICD-10-CM

## 2019-03-05 LAB — POCT INR: INR: 2 (ref 2.0–3.0)

## 2019-03-05 NOTE — Telephone Encounter (Signed)
Erroneous encounter

## 2019-03-05 NOTE — Patient Instructions (Signed)
INR today 2.0  Continue on same dose of 5mg  daily EXCEPT for 2.5 mg on Mon, Wed and Fri recheck in 2 weeks. Notified patient and lm for home health nurse.

## 2019-03-05 NOTE — Telephone Encounter (Signed)
Spoke with [facility] health nurse for patient to continue same dose of coumadin and recheck in 2 weeks.   I have also made patient aware of dosing.  See coag encounter for details.

## 2019-03-05 NOTE — Telephone Encounter (Signed)
French Ana nurse with [facility] left v/m with called report INR 2.0

## 2019-03-08 ENCOUNTER — Encounter: Payer: Self-pay | Admitting: Family Medicine

## 2019-03-09 DIAGNOSIS — E039 Hypothyroidism, unspecified: Secondary | ICD-10-CM | POA: Diagnosis not present

## 2019-03-09 DIAGNOSIS — E662 Morbid (severe) obesity with alveolar hypoventilation: Secondary | ICD-10-CM | POA: Diagnosis not present

## 2019-03-09 DIAGNOSIS — J9611 Chronic respiratory failure with hypoxia: Secondary | ICD-10-CM | POA: Diagnosis not present

## 2019-03-09 DIAGNOSIS — I5032 Chronic diastolic (congestive) heart failure: Secondary | ICD-10-CM | POA: Diagnosis not present

## 2019-03-09 DIAGNOSIS — D509 Iron deficiency anemia, unspecified: Secondary | ICD-10-CM | POA: Diagnosis not present

## 2019-03-09 DIAGNOSIS — I480 Paroxysmal atrial fibrillation: Secondary | ICD-10-CM | POA: Diagnosis not present

## 2019-03-09 DIAGNOSIS — L89891 Pressure ulcer of other site, stage 1: Secondary | ICD-10-CM | POA: Diagnosis not present

## 2019-03-09 DIAGNOSIS — J449 Chronic obstructive pulmonary disease, unspecified: Secondary | ICD-10-CM | POA: Diagnosis not present

## 2019-03-09 DIAGNOSIS — I279 Pulmonary heart disease, unspecified: Secondary | ICD-10-CM | POA: Diagnosis not present

## 2019-03-10 ENCOUNTER — Other Ambulatory Visit: Payer: Self-pay | Admitting: Family Medicine

## 2019-03-10 DIAGNOSIS — E039 Hypothyroidism, unspecified: Secondary | ICD-10-CM | POA: Diagnosis not present

## 2019-03-10 DIAGNOSIS — I279 Pulmonary heart disease, unspecified: Secondary | ICD-10-CM | POA: Diagnosis not present

## 2019-03-10 DIAGNOSIS — I5032 Chronic diastolic (congestive) heart failure: Secondary | ICD-10-CM | POA: Diagnosis not present

## 2019-03-10 DIAGNOSIS — E662 Morbid (severe) obesity with alveolar hypoventilation: Secondary | ICD-10-CM | POA: Diagnosis not present

## 2019-03-10 DIAGNOSIS — L89891 Pressure ulcer of other site, stage 1: Secondary | ICD-10-CM | POA: Diagnosis not present

## 2019-03-10 DIAGNOSIS — I480 Paroxysmal atrial fibrillation: Secondary | ICD-10-CM | POA: Diagnosis not present

## 2019-03-10 DIAGNOSIS — D509 Iron deficiency anemia, unspecified: Secondary | ICD-10-CM | POA: Diagnosis not present

## 2019-03-10 DIAGNOSIS — J9611 Chronic respiratory failure with hypoxia: Secondary | ICD-10-CM | POA: Diagnosis not present

## 2019-03-10 DIAGNOSIS — J449 Chronic obstructive pulmonary disease, unspecified: Secondary | ICD-10-CM | POA: Diagnosis not present

## 2019-03-11 DIAGNOSIS — L89891 Pressure ulcer of other site, stage 1: Secondary | ICD-10-CM | POA: Diagnosis not present

## 2019-03-11 DIAGNOSIS — J449 Chronic obstructive pulmonary disease, unspecified: Secondary | ICD-10-CM | POA: Diagnosis not present

## 2019-03-11 DIAGNOSIS — I279 Pulmonary heart disease, unspecified: Secondary | ICD-10-CM | POA: Diagnosis not present

## 2019-03-11 DIAGNOSIS — D509 Iron deficiency anemia, unspecified: Secondary | ICD-10-CM | POA: Diagnosis not present

## 2019-03-11 DIAGNOSIS — J9611 Chronic respiratory failure with hypoxia: Secondary | ICD-10-CM | POA: Diagnosis not present

## 2019-03-11 DIAGNOSIS — I5032 Chronic diastolic (congestive) heart failure: Secondary | ICD-10-CM | POA: Diagnosis not present

## 2019-03-11 DIAGNOSIS — E662 Morbid (severe) obesity with alveolar hypoventilation: Secondary | ICD-10-CM | POA: Diagnosis not present

## 2019-03-11 DIAGNOSIS — I480 Paroxysmal atrial fibrillation: Secondary | ICD-10-CM | POA: Diagnosis not present

## 2019-03-11 DIAGNOSIS — E039 Hypothyroidism, unspecified: Secondary | ICD-10-CM | POA: Diagnosis not present

## 2019-03-12 DIAGNOSIS — D509 Iron deficiency anemia, unspecified: Secondary | ICD-10-CM | POA: Diagnosis not present

## 2019-03-12 DIAGNOSIS — L89891 Pressure ulcer of other site, stage 1: Secondary | ICD-10-CM | POA: Diagnosis not present

## 2019-03-12 DIAGNOSIS — J9611 Chronic respiratory failure with hypoxia: Secondary | ICD-10-CM | POA: Diagnosis not present

## 2019-03-12 DIAGNOSIS — R6889 Other general symptoms and signs: Secondary | ICD-10-CM | POA: Diagnosis not present

## 2019-03-12 DIAGNOSIS — I279 Pulmonary heart disease, unspecified: Secondary | ICD-10-CM | POA: Diagnosis not present

## 2019-03-12 DIAGNOSIS — I5032 Chronic diastolic (congestive) heart failure: Secondary | ICD-10-CM | POA: Diagnosis not present

## 2019-03-12 DIAGNOSIS — E039 Hypothyroidism, unspecified: Secondary | ICD-10-CM | POA: Diagnosis not present

## 2019-03-12 DIAGNOSIS — E662 Morbid (severe) obesity with alveolar hypoventilation: Secondary | ICD-10-CM | POA: Diagnosis not present

## 2019-03-12 DIAGNOSIS — J449 Chronic obstructive pulmonary disease, unspecified: Secondary | ICD-10-CM | POA: Diagnosis not present

## 2019-03-12 DIAGNOSIS — I480 Paroxysmal atrial fibrillation: Secondary | ICD-10-CM | POA: Diagnosis not present

## 2019-03-13 DIAGNOSIS — G4733 Obstructive sleep apnea (adult) (pediatric): Secondary | ICD-10-CM | POA: Diagnosis not present

## 2019-03-16 DIAGNOSIS — I279 Pulmonary heart disease, unspecified: Secondary | ICD-10-CM | POA: Diagnosis not present

## 2019-03-16 DIAGNOSIS — L89891 Pressure ulcer of other site, stage 1: Secondary | ICD-10-CM | POA: Diagnosis not present

## 2019-03-16 DIAGNOSIS — I480 Paroxysmal atrial fibrillation: Secondary | ICD-10-CM | POA: Diagnosis not present

## 2019-03-16 DIAGNOSIS — D509 Iron deficiency anemia, unspecified: Secondary | ICD-10-CM | POA: Diagnosis not present

## 2019-03-16 DIAGNOSIS — I5032 Chronic diastolic (congestive) heart failure: Secondary | ICD-10-CM | POA: Diagnosis not present

## 2019-03-16 DIAGNOSIS — J9611 Chronic respiratory failure with hypoxia: Secondary | ICD-10-CM | POA: Diagnosis not present

## 2019-03-16 DIAGNOSIS — E039 Hypothyroidism, unspecified: Secondary | ICD-10-CM | POA: Diagnosis not present

## 2019-03-16 DIAGNOSIS — J449 Chronic obstructive pulmonary disease, unspecified: Secondary | ICD-10-CM | POA: Diagnosis not present

## 2019-03-16 DIAGNOSIS — E662 Morbid (severe) obesity with alveolar hypoventilation: Secondary | ICD-10-CM | POA: Diagnosis not present

## 2019-03-17 ENCOUNTER — Other Ambulatory Visit: Payer: Self-pay | Admitting: Family Medicine

## 2019-03-17 DIAGNOSIS — I5032 Chronic diastolic (congestive) heart failure: Secondary | ICD-10-CM | POA: Diagnosis not present

## 2019-03-17 DIAGNOSIS — E662 Morbid (severe) obesity with alveolar hypoventilation: Secondary | ICD-10-CM | POA: Diagnosis not present

## 2019-03-17 DIAGNOSIS — E039 Hypothyroidism, unspecified: Secondary | ICD-10-CM | POA: Diagnosis not present

## 2019-03-17 DIAGNOSIS — I279 Pulmonary heart disease, unspecified: Secondary | ICD-10-CM | POA: Diagnosis not present

## 2019-03-17 DIAGNOSIS — L89891 Pressure ulcer of other site, stage 1: Secondary | ICD-10-CM | POA: Diagnosis not present

## 2019-03-17 DIAGNOSIS — D509 Iron deficiency anemia, unspecified: Secondary | ICD-10-CM | POA: Diagnosis not present

## 2019-03-17 DIAGNOSIS — J9611 Chronic respiratory failure with hypoxia: Secondary | ICD-10-CM | POA: Diagnosis not present

## 2019-03-17 DIAGNOSIS — I480 Paroxysmal atrial fibrillation: Secondary | ICD-10-CM | POA: Diagnosis not present

## 2019-03-17 DIAGNOSIS — J449 Chronic obstructive pulmonary disease, unspecified: Secondary | ICD-10-CM | POA: Diagnosis not present

## 2019-03-19 ENCOUNTER — Other Ambulatory Visit: Payer: Self-pay | Admitting: Family Medicine

## 2019-03-19 DIAGNOSIS — J449 Chronic obstructive pulmonary disease, unspecified: Secondary | ICD-10-CM | POA: Diagnosis not present

## 2019-03-19 DIAGNOSIS — I279 Pulmonary heart disease, unspecified: Secondary | ICD-10-CM | POA: Diagnosis not present

## 2019-03-19 DIAGNOSIS — I5032 Chronic diastolic (congestive) heart failure: Secondary | ICD-10-CM | POA: Diagnosis not present

## 2019-03-19 DIAGNOSIS — L89891 Pressure ulcer of other site, stage 1: Secondary | ICD-10-CM | POA: Diagnosis not present

## 2019-03-19 DIAGNOSIS — G4733 Obstructive sleep apnea (adult) (pediatric): Secondary | ICD-10-CM | POA: Diagnosis not present

## 2019-03-19 DIAGNOSIS — D509 Iron deficiency anemia, unspecified: Secondary | ICD-10-CM | POA: Diagnosis not present

## 2019-03-19 DIAGNOSIS — E662 Morbid (severe) obesity with alveolar hypoventilation: Secondary | ICD-10-CM | POA: Diagnosis not present

## 2019-03-19 DIAGNOSIS — E039 Hypothyroidism, unspecified: Secondary | ICD-10-CM | POA: Diagnosis not present

## 2019-03-19 DIAGNOSIS — J9611 Chronic respiratory failure with hypoxia: Secondary | ICD-10-CM | POA: Diagnosis not present

## 2019-03-19 DIAGNOSIS — I480 Paroxysmal atrial fibrillation: Secondary | ICD-10-CM | POA: Diagnosis not present

## 2019-03-19 MED ORDER — POLYSACCHARIDE IRON COMPLEX 150 MG PO CAPS: 150.0000 mg | ORAL_CAPSULE | Freq: Two times a day (BID) | ORAL | 1 refills | Status: DC

## 2019-03-19 NOTE — Telephone Encounter (Signed)
Due to labs iron Rx increased to BID, pt aware

## 2019-03-20 DIAGNOSIS — J449 Chronic obstructive pulmonary disease, unspecified: Secondary | ICD-10-CM | POA: Diagnosis not present

## 2019-03-20 DIAGNOSIS — J9611 Chronic respiratory failure with hypoxia: Secondary | ICD-10-CM | POA: Diagnosis not present

## 2019-03-20 DIAGNOSIS — D509 Iron deficiency anemia, unspecified: Secondary | ICD-10-CM | POA: Diagnosis not present

## 2019-03-20 DIAGNOSIS — I480 Paroxysmal atrial fibrillation: Secondary | ICD-10-CM | POA: Diagnosis not present

## 2019-03-20 DIAGNOSIS — L89891 Pressure ulcer of other site, stage 1: Secondary | ICD-10-CM | POA: Diagnosis not present

## 2019-03-20 DIAGNOSIS — I279 Pulmonary heart disease, unspecified: Secondary | ICD-10-CM | POA: Diagnosis not present

## 2019-03-20 DIAGNOSIS — I5032 Chronic diastolic (congestive) heart failure: Secondary | ICD-10-CM | POA: Diagnosis not present

## 2019-03-20 DIAGNOSIS — E039 Hypothyroidism, unspecified: Secondary | ICD-10-CM | POA: Diagnosis not present

## 2019-03-20 DIAGNOSIS — E662 Morbid (severe) obesity with alveolar hypoventilation: Secondary | ICD-10-CM | POA: Diagnosis not present

## 2019-03-20 LAB — POCT INR: INR: 2.6 (ref 2.0–3.0)

## 2019-03-23 ENCOUNTER — Ambulatory Visit (INDEPENDENT_AMBULATORY_CARE_PROVIDER_SITE_OTHER): Payer: Medicare HMO

## 2019-03-23 DIAGNOSIS — Z5181 Encounter for therapeutic drug level monitoring: Secondary | ICD-10-CM | POA: Diagnosis not present

## 2019-03-23 DIAGNOSIS — I4821 Permanent atrial fibrillation: Secondary | ICD-10-CM

## 2019-03-23 DIAGNOSIS — Z86718 Personal history of other venous thrombosis and embolism: Secondary | ICD-10-CM

## 2019-03-23 DIAGNOSIS — Z86711 Personal history of pulmonary embolism: Secondary | ICD-10-CM

## 2019-03-23 NOTE — Patient Instructions (Signed)
INR today 2.6  Continue on same dose of 5mg  daily EXCEPT for 2.5 mg on Mon, Wed and Fri recheck in 3 weeks. Notified patient and lm for home health nurse.

## 2019-03-24 DIAGNOSIS — L89891 Pressure ulcer of other site, stage 1: Secondary | ICD-10-CM | POA: Diagnosis not present

## 2019-03-24 DIAGNOSIS — J9611 Chronic respiratory failure with hypoxia: Secondary | ICD-10-CM | POA: Diagnosis not present

## 2019-03-24 DIAGNOSIS — E039 Hypothyroidism, unspecified: Secondary | ICD-10-CM | POA: Diagnosis not present

## 2019-03-24 DIAGNOSIS — E662 Morbid (severe) obesity with alveolar hypoventilation: Secondary | ICD-10-CM | POA: Diagnosis not present

## 2019-03-24 DIAGNOSIS — I5032 Chronic diastolic (congestive) heart failure: Secondary | ICD-10-CM | POA: Diagnosis not present

## 2019-03-24 DIAGNOSIS — D509 Iron deficiency anemia, unspecified: Secondary | ICD-10-CM | POA: Diagnosis not present

## 2019-03-24 DIAGNOSIS — I480 Paroxysmal atrial fibrillation: Secondary | ICD-10-CM | POA: Diagnosis not present

## 2019-03-24 DIAGNOSIS — I279 Pulmonary heart disease, unspecified: Secondary | ICD-10-CM | POA: Diagnosis not present

## 2019-03-24 DIAGNOSIS — J449 Chronic obstructive pulmonary disease, unspecified: Secondary | ICD-10-CM | POA: Diagnosis not present

## 2019-03-25 ENCOUNTER — Telehealth: Payer: Self-pay | Admitting: *Deleted

## 2019-03-25 DIAGNOSIS — L89891 Pressure ulcer of other site, stage 1: Secondary | ICD-10-CM | POA: Diagnosis not present

## 2019-03-25 DIAGNOSIS — D509 Iron deficiency anemia, unspecified: Secondary | ICD-10-CM | POA: Diagnosis not present

## 2019-03-25 DIAGNOSIS — Z7901 Long term (current) use of anticoagulants: Secondary | ICD-10-CM | POA: Diagnosis not present

## 2019-03-25 DIAGNOSIS — R6889 Other general symptoms and signs: Secondary | ICD-10-CM | POA: Diagnosis not present

## 2019-03-25 DIAGNOSIS — I5032 Chronic diastolic (congestive) heart failure: Secondary | ICD-10-CM | POA: Diagnosis not present

## 2019-03-25 DIAGNOSIS — J9611 Chronic respiratory failure with hypoxia: Secondary | ICD-10-CM | POA: Diagnosis not present

## 2019-03-25 DIAGNOSIS — J449 Chronic obstructive pulmonary disease, unspecified: Secondary | ICD-10-CM | POA: Diagnosis not present

## 2019-03-25 DIAGNOSIS — I279 Pulmonary heart disease, unspecified: Secondary | ICD-10-CM | POA: Diagnosis not present

## 2019-03-25 DIAGNOSIS — E662 Morbid (severe) obesity with alveolar hypoventilation: Secondary | ICD-10-CM | POA: Diagnosis not present

## 2019-03-25 DIAGNOSIS — I480 Paroxysmal atrial fibrillation: Secondary | ICD-10-CM | POA: Diagnosis not present

## 2019-03-25 DIAGNOSIS — E039 Hypothyroidism, unspecified: Secondary | ICD-10-CM | POA: Diagnosis not present

## 2019-03-25 NOTE — Telephone Encounter (Signed)
Amy Bryant notified as instructed by telephone and verbalized understanding. Amy Bryant stated that the patient is not having any other symptoms as we discussed. Amy Bryant stated that she will check and see if she has the supplies to draw a CBC and if not she will get the supplies and go back in the morning to draw the blood work.

## 2019-03-25 NOTE — Telephone Encounter (Signed)
ER eval is reasonable for lightheaded dizzy and change in status noted by [facility] RN. If pt refusing, can we check CBC in h/o severe anemia 12/2018 (latest Hgb up to 10 last month). Are thigh wounds healed well? Thanks.  Ensure no other symptoms like unilateral weakness, slurred speech, HA, chest pain, worsening dyspnea or cough/fever.

## 2019-03-25 NOTE — Telephone Encounter (Signed)
Olivia Mackie nurse with [facility] called stating that she is with patient now and she is complaining of being light headed and dizzy. Olivia Mackie stated that patient told her that she has had spells like this before. Olivia Mackie stated that her blood pressure is 126/60,  blood sugar 120, oxygen level 91% on 4 liters of oxygen. Olivia Mackie stated that she has been coming out for several months and has not seen her like this before. Olivia Mackie stated that her heart rate is running between 100-102. Olivia Mackie stated that she had blood work recently and her hemoglobin was low and she was advised to take an iron pill. Olivia Mackie the nurse wants to know what is recommended because patient will not go to the hospital.

## 2019-03-27 ENCOUNTER — Telehealth: Payer: Self-pay | Admitting: *Deleted

## 2019-03-27 DIAGNOSIS — J449 Chronic obstructive pulmonary disease, unspecified: Secondary | ICD-10-CM | POA: Diagnosis not present

## 2019-03-27 DIAGNOSIS — J9611 Chronic respiratory failure with hypoxia: Secondary | ICD-10-CM | POA: Diagnosis not present

## 2019-03-27 DIAGNOSIS — E039 Hypothyroidism, unspecified: Secondary | ICD-10-CM | POA: Diagnosis not present

## 2019-03-27 DIAGNOSIS — L89891 Pressure ulcer of other site, stage 1: Secondary | ICD-10-CM | POA: Diagnosis not present

## 2019-03-27 DIAGNOSIS — E662 Morbid (severe) obesity with alveolar hypoventilation: Secondary | ICD-10-CM | POA: Diagnosis not present

## 2019-03-27 DIAGNOSIS — I5032 Chronic diastolic (congestive) heart failure: Secondary | ICD-10-CM | POA: Diagnosis not present

## 2019-03-27 DIAGNOSIS — D509 Iron deficiency anemia, unspecified: Secondary | ICD-10-CM | POA: Diagnosis not present

## 2019-03-27 DIAGNOSIS — I480 Paroxysmal atrial fibrillation: Secondary | ICD-10-CM | POA: Diagnosis not present

## 2019-03-27 DIAGNOSIS — I279 Pulmonary heart disease, unspecified: Secondary | ICD-10-CM | POA: Diagnosis not present

## 2019-03-27 NOTE — Telephone Encounter (Signed)
WBC elevated to 15k Mild anemia Hgb 10.7 which shouldn't be causing symptoms.  Concern for infection with elevated white count. Recommend evaluation today - I spoke with Olivia Mackie [facility] RN who will get her evaluated today at ER.

## 2019-03-27 NOTE — Telephone Encounter (Signed)
Olivia Mackie nurse with [facility] called stating that patient had a CBC.done and results were faxed to the office. Olivia Mackie stated that she saw the patient about an hour ago and she is complaining of  dizziness, nausea and not eating. Olivia Mackie stated that her vital signs are good, blood sugar 120 and lungs are clear. Olivia Mackie stated that she told the patient if she continues to get worse she needs to go to the ER and her husband told her that he will make sure that she does. CBC results given to Dr. Danise Mina.

## 2019-03-27 NOTE — Telephone Encounter (Signed)
Thanks- agree with that advisement

## 2019-03-28 ENCOUNTER — Inpatient Hospital Stay (HOSPITAL_COMMUNITY)
Admission: EM | Admit: 2019-03-28 | Discharge: 2019-04-11 | DRG: 871 | Disposition: A | Discharge status: Skilled Nursing Facility | Payer: Medicare HMO | Attending: Internal Medicine | Admitting: Internal Medicine

## 2019-03-28 ENCOUNTER — Encounter (HOSPITAL_COMMUNITY): Payer: Self-pay | Admitting: *Deleted

## 2019-03-28 ENCOUNTER — Emergency Department (HOSPITAL_COMMUNITY): Payer: Medicare HMO

## 2019-03-28 ENCOUNTER — Inpatient Hospital Stay (HOSPITAL_COMMUNITY): Payer: Medicare HMO

## 2019-03-28 ENCOUNTER — Other Ambulatory Visit: Payer: Self-pay

## 2019-03-28 DIAGNOSIS — M19071 Primary osteoarthritis, right ankle and foot: Secondary | ICD-10-CM | POA: Diagnosis not present

## 2019-03-28 DIAGNOSIS — E039 Hypothyroidism, unspecified: Secondary | ICD-10-CM | POA: Diagnosis not present

## 2019-03-28 DIAGNOSIS — E861 Hypovolemia: Secondary | ICD-10-CM | POA: Diagnosis present

## 2019-03-28 DIAGNOSIS — J690 Pneumonitis due to inhalation of food and vomit: Secondary | ICD-10-CM | POA: Diagnosis present

## 2019-03-28 DIAGNOSIS — N179 Acute kidney failure, unspecified: Secondary | ICD-10-CM | POA: Diagnosis present

## 2019-03-28 DIAGNOSIS — Z888 Allergy status to other drugs, medicaments and biological substances status: Secondary | ICD-10-CM | POA: Diagnosis not present

## 2019-03-28 DIAGNOSIS — Z79890 Hormone replacement therapy (postmenopausal): Secondary | ICD-10-CM

## 2019-03-28 DIAGNOSIS — R410 Disorientation, unspecified: Secondary | ICD-10-CM | POA: Diagnosis not present

## 2019-03-28 DIAGNOSIS — A419 Sepsis, unspecified organism: Principal | ICD-10-CM

## 2019-03-28 DIAGNOSIS — J189 Pneumonia, unspecified organism: Secondary | ICD-10-CM

## 2019-03-28 DIAGNOSIS — F419 Anxiety disorder, unspecified: Secondary | ICD-10-CM | POA: Diagnosis present

## 2019-03-28 DIAGNOSIS — Z79899 Other long term (current) drug therapy: Secondary | ICD-10-CM

## 2019-03-28 DIAGNOSIS — R488 Other symbolic dysfunctions: Secondary | ICD-10-CM | POA: Diagnosis not present

## 2019-03-28 DIAGNOSIS — K219 Gastro-esophageal reflux disease without esophagitis: Secondary | ICD-10-CM | POA: Diagnosis present

## 2019-03-28 DIAGNOSIS — E878 Other disorders of electrolyte and fluid balance, not elsewhere classified: Secondary | ICD-10-CM | POA: Diagnosis present

## 2019-03-28 DIAGNOSIS — Z1159 Encounter for screening for other viral diseases: Secondary | ICD-10-CM

## 2019-03-28 DIAGNOSIS — E662 Morbid (severe) obesity with alveolar hypoventilation: Secondary | ICD-10-CM | POA: Diagnosis present

## 2019-03-28 DIAGNOSIS — D649 Anemia, unspecified: Secondary | ICD-10-CM | POA: Diagnosis present

## 2019-03-28 DIAGNOSIS — Z87891 Personal history of nicotine dependence: Secondary | ICD-10-CM

## 2019-03-28 DIAGNOSIS — Z91040 Latex allergy status: Secondary | ICD-10-CM | POA: Diagnosis not present

## 2019-03-28 DIAGNOSIS — J9621 Acute and chronic respiratory failure with hypoxia: Secondary | ICD-10-CM | POA: Diagnosis present

## 2019-03-28 DIAGNOSIS — J9811 Atelectasis: Secondary | ICD-10-CM | POA: Diagnosis not present

## 2019-03-28 DIAGNOSIS — M85871 Other specified disorders of bone density and structure, right ankle and foot: Secondary | ICD-10-CM | POA: Diagnosis not present

## 2019-03-28 DIAGNOSIS — G4733 Obstructive sleep apnea (adult) (pediatric): Secondary | ICD-10-CM | POA: Diagnosis present

## 2019-03-28 DIAGNOSIS — G9349 Other encephalopathy: Secondary | ICD-10-CM | POA: Diagnosis not present

## 2019-03-28 DIAGNOSIS — I5032 Chronic diastolic (congestive) heart failure: Secondary | ICD-10-CM | POA: Diagnosis not present

## 2019-03-28 DIAGNOSIS — L304 Erythema intertrigo: Secondary | ICD-10-CM | POA: Diagnosis present

## 2019-03-28 DIAGNOSIS — I5033 Acute on chronic diastolic (congestive) heart failure: Secondary | ICD-10-CM | POA: Diagnosis present

## 2019-03-28 DIAGNOSIS — I493 Ventricular premature depolarization: Secondary | ICD-10-CM | POA: Diagnosis not present

## 2019-03-28 DIAGNOSIS — J9600 Acute respiratory failure, unspecified whether with hypoxia or hypercapnia: Secondary | ICD-10-CM | POA: Diagnosis not present

## 2019-03-28 DIAGNOSIS — N200 Calculus of kidney: Secondary | ICD-10-CM | POA: Diagnosis not present

## 2019-03-28 DIAGNOSIS — Z9119 Patient's noncompliance with other medical treatment and regimen: Secondary | ICD-10-CM

## 2019-03-28 DIAGNOSIS — J449 Chronic obstructive pulmonary disease, unspecified: Secondary | ICD-10-CM | POA: Diagnosis present

## 2019-03-28 DIAGNOSIS — R6520 Severe sepsis without septic shock: Secondary | ICD-10-CM | POA: Diagnosis not present

## 2019-03-28 DIAGNOSIS — Z7901 Long term (current) use of anticoagulants: Secondary | ICD-10-CM

## 2019-03-28 DIAGNOSIS — Z7401 Bed confinement status: Secondary | ICD-10-CM | POA: Diagnosis not present

## 2019-03-28 DIAGNOSIS — Z833 Family history of diabetes mellitus: Secondary | ICD-10-CM

## 2019-03-28 DIAGNOSIS — E1121 Type 2 diabetes mellitus with diabetic nephropathy: Secondary | ICD-10-CM | POA: Diagnosis not present

## 2019-03-28 DIAGNOSIS — I959 Hypotension, unspecified: Secondary | ICD-10-CM | POA: Diagnosis not present

## 2019-03-28 DIAGNOSIS — E119 Type 2 diabetes mellitus without complications: Secondary | ICD-10-CM | POA: Diagnosis not present

## 2019-03-28 DIAGNOSIS — J9611 Chronic respiratory failure with hypoxia: Secondary | ICD-10-CM | POA: Diagnosis not present

## 2019-03-28 DIAGNOSIS — Z881 Allergy status to other antibiotic agents status: Secondary | ICD-10-CM | POA: Diagnosis not present

## 2019-03-28 DIAGNOSIS — R2689 Other abnormalities of gait and mobility: Secondary | ICD-10-CM | POA: Diagnosis not present

## 2019-03-28 DIAGNOSIS — Z6841 Body Mass Index (BMI) 40.0 and over, adult: Secondary | ICD-10-CM

## 2019-03-28 DIAGNOSIS — E871 Hypo-osmolality and hyponatremia: Secondary | ICD-10-CM | POA: Diagnosis present

## 2019-03-28 DIAGNOSIS — F319 Bipolar disorder, unspecified: Secondary | ICD-10-CM | POA: Diagnosis present

## 2019-03-28 DIAGNOSIS — B999 Unspecified infectious disease: Secondary | ICD-10-CM

## 2019-03-28 DIAGNOSIS — R791 Abnormal coagulation profile: Secondary | ICD-10-CM | POA: Diagnosis present

## 2019-03-28 DIAGNOSIS — I4891 Unspecified atrial fibrillation: Secondary | ICD-10-CM | POA: Diagnosis present

## 2019-03-28 DIAGNOSIS — R339 Retention of urine, unspecified: Secondary | ICD-10-CM | POA: Diagnosis not present

## 2019-03-28 DIAGNOSIS — E161 Other hypoglycemia: Secondary | ICD-10-CM | POA: Diagnosis not present

## 2019-03-28 DIAGNOSIS — I469 Cardiac arrest, cause unspecified: Secondary | ICD-10-CM | POA: Diagnosis not present

## 2019-03-28 DIAGNOSIS — E876 Hypokalemia: Secondary | ICD-10-CM | POA: Diagnosis present

## 2019-03-28 DIAGNOSIS — I4821 Permanent atrial fibrillation: Secondary | ICD-10-CM | POA: Diagnosis not present

## 2019-03-28 DIAGNOSIS — T17908A Unspecified foreign body in respiratory tract, part unspecified causing other injury, initial encounter: Secondary | ICD-10-CM

## 2019-03-28 DIAGNOSIS — Z7984 Long term (current) use of oral hypoglycemic drugs: Secondary | ICD-10-CM

## 2019-03-28 DIAGNOSIS — I2781 Cor pulmonale (chronic): Secondary | ICD-10-CM | POA: Diagnosis present

## 2019-03-28 DIAGNOSIS — R0902 Hypoxemia: Secondary | ICD-10-CM | POA: Diagnosis not present

## 2019-03-28 DIAGNOSIS — Z79891 Long term (current) use of opiate analgesic: Secondary | ICD-10-CM

## 2019-03-28 DIAGNOSIS — I480 Paroxysmal atrial fibrillation: Secondary | ICD-10-CM | POA: Diagnosis not present

## 2019-03-28 DIAGNOSIS — M2550 Pain in unspecified joint: Secondary | ICD-10-CM | POA: Diagnosis not present

## 2019-03-28 DIAGNOSIS — Z7951 Long term (current) use of inhaled steroids: Secondary | ICD-10-CM

## 2019-03-28 DIAGNOSIS — Z9989 Dependence on other enabling machines and devices: Secondary | ICD-10-CM | POA: Diagnosis not present

## 2019-03-28 DIAGNOSIS — M6281 Muscle weakness (generalized): Secondary | ICD-10-CM | POA: Diagnosis not present

## 2019-03-28 DIAGNOSIS — E162 Hypoglycemia, unspecified: Secondary | ICD-10-CM | POA: Diagnosis not present

## 2019-03-28 DIAGNOSIS — E43 Unspecified severe protein-calorie malnutrition: Secondary | ICD-10-CM | POA: Diagnosis not present

## 2019-03-28 DIAGNOSIS — Z86711 Personal history of pulmonary embolism: Secondary | ICD-10-CM

## 2019-03-28 DIAGNOSIS — I482 Chronic atrial fibrillation, unspecified: Secondary | ICD-10-CM | POA: Diagnosis not present

## 2019-03-28 DIAGNOSIS — F329 Major depressive disorder, single episode, unspecified: Secondary | ICD-10-CM | POA: Diagnosis not present

## 2019-03-28 DIAGNOSIS — R0989 Other specified symptoms and signs involving the circulatory and respiratory systems: Secondary | ICD-10-CM | POA: Diagnosis not present

## 2019-03-28 DIAGNOSIS — D509 Iron deficiency anemia, unspecified: Secondary | ICD-10-CM | POA: Diagnosis not present

## 2019-03-28 DIAGNOSIS — J9620 Acute and chronic respiratory failure, unspecified whether with hypoxia or hypercapnia: Secondary | ICD-10-CM | POA: Diagnosis present

## 2019-03-28 DIAGNOSIS — L03115 Cellulitis of right lower limb: Secondary | ICD-10-CM | POA: Diagnosis not present

## 2019-03-28 DIAGNOSIS — K8020 Calculus of gallbladder without cholecystitis without obstruction: Secondary | ICD-10-CM | POA: Diagnosis not present

## 2019-03-28 DIAGNOSIS — R918 Other nonspecific abnormal finding of lung field: Secondary | ICD-10-CM | POA: Diagnosis not present

## 2019-03-28 DIAGNOSIS — Z9981 Dependence on supplemental oxygen: Secondary | ICD-10-CM

## 2019-03-28 DIAGNOSIS — R52 Pain, unspecified: Secondary | ICD-10-CM | POA: Diagnosis not present

## 2019-03-28 DIAGNOSIS — Z8249 Family history of ischemic heart disease and other diseases of the circulatory system: Secondary | ICD-10-CM

## 2019-03-28 LAB — CBC WITH DIFFERENTIAL/PLATELET
Abs Immature Granulocytes: 0 10*3/uL (ref 0.00–0.07)
Basophils Absolute: 0 10*3/uL (ref 0.0–0.1)
Basophils Relative: 0 %
Eosinophils Absolute: 0 10*3/uL (ref 0.0–0.5)
Eosinophils Relative: 0 %
HCT: 33.2 % — ABNORMAL LOW (ref 36.0–46.0)
Hemoglobin: 10.5 g/dL — ABNORMAL LOW (ref 12.0–15.0)
Lymphocytes Relative: 2 %
Lymphs Abs: 0.4 10*3/uL — ABNORMAL LOW (ref 0.7–4.0)
MCH: 28.9 pg (ref 26.0–34.0)
MCHC: 31.6 g/dL (ref 30.0–36.0)
MCV: 91.5 fL (ref 80.0–100.0)
Monocytes Absolute: 0.2 10*3/uL (ref 0.1–1.0)
Monocytes Relative: 1 %
Neutro Abs: 20.2 10*3/uL — ABNORMAL HIGH (ref 1.7–7.7)
Neutrophils Relative %: 97 %
Platelets: 194 10*3/uL (ref 150–400)
RBC: 3.63 MIL/uL — ABNORMAL LOW (ref 3.87–5.11)
RDW: 18.6 % — ABNORMAL HIGH (ref 11.5–15.5)
WBC: 20.8 10*3/uL — ABNORMAL HIGH (ref 4.0–10.5)
nRBC: 0 % (ref 0.0–0.2)

## 2019-03-28 LAB — COMPREHENSIVE METABOLIC PANEL
ALT: 34 U/L (ref 0–44)
AST: 55 U/L — ABNORMAL HIGH (ref 15–41)
Albumin: 2.6 g/dL — ABNORMAL LOW (ref 3.5–5.0)
Alkaline Phosphatase: 118 U/L (ref 38–126)
Anion gap: 12 (ref 5–15)
BUN: 56 mg/dL — ABNORMAL HIGH (ref 8–23)
CO2: 25 mmol/L (ref 22–32)
Calcium: 8.9 mg/dL (ref 8.9–10.3)
Chloride: 90 mmol/L — ABNORMAL LOW (ref 98–111)
Creatinine, Ser: 2.25 mg/dL — ABNORMAL HIGH (ref 0.44–1.00)
GFR calc Af Amer: 26 mL/min — ABNORMAL LOW (ref >60)
GFR calc non Af Amer: 22 mL/min — ABNORMAL LOW (ref >60)
Glucose, Bld: 103 mg/dL — ABNORMAL HIGH (ref 70–99)
Potassium: 4.4 mmol/L (ref 3.5–5.1)
Sodium: 127 mmol/L — ABNORMAL LOW (ref 135–145)
Total Bilirubin: 1.7 mg/dL — ABNORMAL HIGH (ref 0.3–1.2)
Total Protein: 6.8 g/dL (ref 6.5–8.1)

## 2019-03-28 LAB — HEMOGLOBIN A1C
Hgb A1c MFr Bld: 5.2 % (ref 4.8–5.6)
Mean Plasma Glucose: 102.54 mg/dL

## 2019-03-28 LAB — GLUCOSE, CAPILLARY: Glucose-Capillary: 106 mg/dL — ABNORMAL HIGH (ref 70–99)

## 2019-03-28 LAB — TSH: TSH: 4.678 u[IU]/mL — ABNORMAL HIGH (ref 0.350–4.500)

## 2019-03-28 LAB — PROTIME-INR
INR: 3.4 — ABNORMAL HIGH (ref 0.8–1.2)
Prothrombin Time: 33.7 seconds — ABNORMAL HIGH (ref 11.4–15.2)

## 2019-03-28 LAB — SARS CORONAVIRUS 2 BY RT PCR (HOSPITAL ORDER, PERFORMED IN ~~LOC~~ HOSPITAL LAB): SARS Coronavirus 2: NEGATIVE

## 2019-03-28 LAB — LACTIC ACID, PLASMA
Lactic Acid, Venous: 1.5 mmol/L (ref 0.5–1.9)
Lactic Acid, Venous: 1.5 mmol/L (ref 0.5–1.9)

## 2019-03-28 LAB — MRSA PCR SCREENING: MRSA by PCR: NEGATIVE

## 2019-03-28 MED ORDER — SIMVASTATIN 20 MG PO TABS
20.0000 mg | ORAL_TABLET | Freq: Every day | ORAL | Status: DC
  Administered 2019-03-28 – 2019-04-10 (×14): 20 mg via ORAL
  Filled 2019-03-28 (×14): qty 1

## 2019-03-28 MED ORDER — ONDANSETRON HCL 4 MG PO TABS: 4.0000 mg | ORAL_TABLET | Freq: Four times a day (QID) | ORAL | Status: DC | PRN

## 2019-03-28 MED ORDER — CEFAZOLIN SODIUM-DEXTROSE 1-4 GM/50ML-% IV SOLN: 1.0000 g | Freq: Once | INTRAVENOUS | Status: DC

## 2019-03-28 MED ORDER — LEVOTHYROXINE SODIUM 75 MCG PO TABS
175.0000 ug | ORAL_TABLET | Freq: Every day | ORAL | Status: DC
  Administered 2019-03-29 – 2019-04-11 (×13): 175 ug via ORAL
  Filled 2019-03-28 (×13): qty 1

## 2019-03-28 MED ORDER — WARFARIN - PHARMACIST DOSING INPATIENT
Freq: Every day | Status: DC
  Administered 2019-04-10: 1

## 2019-03-28 MED ORDER — MOMETASONE FURO-FORMOTEROL FUM 200-5 MCG/ACT IN AERO
2.0000 | INHALATION_SPRAY | Freq: Two times a day (BID) | RESPIRATORY_TRACT | Status: DC
  Administered 2019-03-28 – 2019-04-11 (×27): 2 via RESPIRATORY_TRACT
  Filled 2019-03-28: qty 8.8

## 2019-03-28 MED ORDER — INSULIN ASPART 100 UNIT/ML ~~LOC~~ SOLN
0.0000 [IU] | Freq: Every day | SUBCUTANEOUS | Status: DC
  Administered 2019-03-29: 3 [IU] via SUBCUTANEOUS

## 2019-03-28 MED ORDER — ALPRAZOLAM 0.5 MG PO TABS
0.5000 mg | ORAL_TABLET | Freq: Two times a day (BID) | ORAL | Status: DC | PRN
  Administered 2019-03-28 – 2019-04-07 (×4): 0.5 mg via ORAL
  Filled 2019-03-28 (×4): qty 1

## 2019-03-28 MED ORDER — INSULIN ASPART 100 UNIT/ML ~~LOC~~ SOLN
0.0000 [IU] | Freq: Three times a day (TID) | SUBCUTANEOUS | Status: DC
  Administered 2019-04-01 – 2019-04-09 (×3): 1 [IU] via SUBCUTANEOUS

## 2019-03-28 MED ORDER — CEFAZOLIN SODIUM-DEXTROSE 2-4 GM/100ML-% IV SOLN
2.0000 g | Freq: Two times a day (BID) | INTRAVENOUS | Status: DC
  Administered 2019-03-29: 2 g via INTRAVENOUS
  Filled 2019-03-28 (×2): qty 100

## 2019-03-28 MED ORDER — NYSTATIN 100000 UNIT/GM EX POWD
Freq: Two times a day (BID) | CUTANEOUS | Status: DC
  Administered 2019-03-28: 22:00:00 via TOPICAL
  Filled 2019-03-28 (×2): qty 15

## 2019-03-28 MED ORDER — FLUOXETINE HCL 20 MG PO CAPS
40.0000 mg | ORAL_CAPSULE | Freq: Every day | ORAL | Status: DC
  Administered 2019-03-29 – 2019-04-11 (×14): 40 mg via ORAL
  Filled 2019-03-28 (×14): qty 2

## 2019-03-28 MED ORDER — SODIUM CHLORIDE 0.9 % IV SOLN
INTRAVENOUS | Status: DC
  Administered 2019-03-28 – 2019-04-03 (×8): via INTRAVENOUS

## 2019-03-28 MED ORDER — CEFAZOLIN SODIUM-DEXTROSE 2-4 GM/100ML-% IV SOLN
2.0000 g | Freq: Once | INTRAVENOUS | Status: AC
  Administered 2019-03-28: 2 g via INTRAVENOUS
  Filled 2019-03-28: qty 100

## 2019-03-28 MED ORDER — SODIUM CHLORIDE 0.9% FLUSH
3.0000 mL | Freq: Two times a day (BID) | INTRAVENOUS | Status: DC
  Administered 2019-03-28 – 2019-04-11 (×23): 3 mL via INTRAVENOUS

## 2019-03-28 MED ORDER — HYDROCODONE-ACETAMINOPHEN 5-325 MG PO TABS
1.0000 | ORAL_TABLET | Freq: Four times a day (QID) | ORAL | Status: DC | PRN
  Administered 2019-03-28 – 2019-04-02 (×7): 1 via ORAL
  Filled 2019-03-28 (×7): qty 1

## 2019-03-28 MED ORDER — SODIUM CHLORIDE 0.9 % IV BOLUS (SEPSIS)
800.0000 mL | Freq: Once | INTRAVENOUS | Status: AC
  Administered 2019-03-28: 800 mL via INTRAVENOUS

## 2019-03-28 MED ORDER — HYDROCODONE-ACETAMINOPHEN 5-325 MG PO TABS
1.0000 | ORAL_TABLET | Freq: Once | ORAL | Status: AC
  Administered 2019-03-28: 1 via ORAL
  Filled 2019-03-28: qty 1

## 2019-03-28 MED ORDER — ACETAMINOPHEN 650 MG RE SUPP: 650.0000 mg | Freq: Four times a day (QID) | RECTAL | Status: DC | PRN

## 2019-03-28 MED ORDER — ALBUTEROL SULFATE (2.5 MG/3ML) 0.083% IN NEBU: 2.5000 mg | INHALATION_SOLUTION | RESPIRATORY_TRACT | Status: DC | PRN

## 2019-03-28 MED ORDER — ONDANSETRON HCL 4 MG/2ML IJ SOLN: 4.0000 mg | Freq: Four times a day (QID) | INTRAMUSCULAR | Status: DC | PRN

## 2019-03-28 MED ORDER — ACETAMINOPHEN 325 MG PO TABS
650.0000 mg | ORAL_TABLET | Freq: Four times a day (QID) | ORAL | Status: DC | PRN
  Administered 2019-03-29 – 2019-04-07 (×5): 650 mg via ORAL
  Filled 2019-03-28 (×4): qty 2

## 2019-03-28 MED ORDER — SODIUM CHLORIDE 0.9 % IV BOLUS (SEPSIS)
1000.0000 mL | Freq: Once | INTRAVENOUS | Status: AC
  Administered 2019-03-28: 1000 mL via INTRAVENOUS

## 2019-03-28 NOTE — Progress Notes (Signed)
Notified bedside nurse of need to order fluid bolus. Had conversation with bedside nurse to have provider order additional fluids for protocol.

## 2019-03-28 NOTE — Progress Notes (Signed)
Two RNs, Camilah Spillman and Erlene Quan,  had long conversation w/patient re: life at home and present condition.  RNs expressed grave concern for patient's well-being at home d/t presenting condition. Patient reports her hair has not been brushed for at least a year - and appearance of hair looks like that is the truth - it is in a matted, compressed, hard consistency from the posterior crown of her neck to the forehead and ear to ear.  Patient states she "exists" at home by living in her recliner.  She sits in the recliner all day and sleeps in it at night. Patient's toenails are at least 3/4 inch in length  Patient states her house is not clean.  She lives with her husband who smokes regularly and has emphysema; one 77yo son, Antony Haste, and his girlfriend, Lorriane Shire, 57 - neither one of them work.  There are also two grandchildren in the home.  Patient states the son and girlfriend bring her food but patient tries to do everything else by herself.  She states her only activity includes walking to the bathroom and back.  Patient reports a PT and RN come twice per week from [facility].

## 2019-03-28 NOTE — ED Triage Notes (Signed)
Pt reports 2 weeks of Bil leg swelling . Skin to Rt LE red . Pt  Sleeps in a recliner with feet on the floor.

## 2019-03-28 NOTE — Progress Notes (Signed)
Patient states that she cannot tolerate the CPAP machine that we use at the hospital and will not be using it. Informed patient that should she change her mind we will bring one to her room.

## 2019-03-28 NOTE — ED Notes (Signed)
ED TO INPATIENT HANDOFF REPORT  ED Nurse Name and Phone #: Magnus IvanLouie RN [phone]  S Name/Age/Gender Amy Bryant 63 y.o. female Room/Bed: 025C/025C  Code Status   Code Status: Prior  Home/SNF/Other Home Patient oriented to: situation Is this baseline? Yes      Chief Complaint Edema  Triage Note Pt reports 2 weeks of Bil leg swelling . Skin to Rt LE red . Pt  Sleeps in a recliner with feet on the floor.    Allergies Allergies  Allergen Reactions  . Pseudoephedrine Other (See Comments)    REACTION: tightness in chest  . Augmentin [Amoxicillin-Pot Clavulanate] Itching and Rash    Did it involve swelling of the face/tongue/throat, SOB, or low BP? No Did it involve sudden or severe rash/hives, skin peeling, or any reaction on the inside of your mouth or nose? Yes Did you need to seek medical attention at a hospital or doctor's office? Yes When did it last happen?2019 If all above answers are "NO", may proceed with cephalosporin use.   . Latex Itching and Rash    Level of Care/Admitting Diagnosis ED Disposition    ED Disposition Condition Comment   Admit  The patient appears reasonably stabilized for admission considering the current resources, flow, and capabilities available in the ED at this time, and I doubt any other [facility] requiring further screening and/or treatment in the ED prior to admission is  present.       B Medical/Surgery History Past Medical History:  Diagnosis Date  . Atrial fibrillation (HCC)    with cardioversion success  . CHF (congestive heart failure) (HCC)   . COPD (chronic obstructive pulmonary disease) (HCC)   . Cor pulmonale (HCC)    and obesity hypoventilation syndrome  . Diabetes mellitus without complication (HCC)   . GERD (gastroesophageal reflux disease)   . Hypothyroid   . Morbid obesity (HCC)   . OA (osteoarthritis)   . Pulmonary embolism (HCC)    hx of on coumadin  . Reactive airways dysfunction syndrome [facility](HCC)    Past  Surgical History:  Procedure Laterality Date  . BIOPSY  01/12/2019   Procedure: BIOPSY;  Surgeon: Beverley FiedlerPyrtle, Jay M, MD;  [facility];  Service: Gastroenterology;;  . COLONOSCOPY WITH PROPOFOL N/A 01/12/2019   Procedure: COLONOSCOPY WITH PROPOFOL;  Surgeon: Beverley FiedlerPyrtle, Jay M, MD;  [facility];  Service: Gastroenterology;  Laterality: N/A;  . ESOPHAGOGASTRODUODENOSCOPY (EGD) WITH PROPOFOL N/A 01/12/2019   Procedure: ESOPHAGOGASTRODUODENOSCOPY (EGD) WITH PROPOFOL;  Surgeon: Beverley FiedlerPyrtle, Jay M, MD;  [facility];  Service: Gastroenterology;  Laterality: N/A;  . TONSILLECTOMY       A IV Location/Drains/Wounds Patient Lines/Drains/Airways Status   Active Line/Drains/Airways    Name:   Placement date:   Placement time:   Site:   Days:   Peripheral IV 03/28/19 Right Antecubital   03/28/19    1411    Antecubital   less than 1   Pressure Injury 01/08/19 Stage II -  Partial thickness loss of dermis presenting as a shallow open ulcer with a red, pink wound bed without slough.   01/08/19    1600     79          Intake/Output Last 24 hours No intake or output data in the 24 hours ending 03/28/19 1559  Labs/Imaging Results for orders placed or performed during the hospital encounter of 03/28/19 (from the past 48 hour(s))  Lactic acid, plasma     Status: None   Collection Time:  03/28/19  1:55 PM  Result Value Ref Range   Lactic Acid, Venous 1.5 0.5 - 1.9 mmol/L    Comment: Performed at [facility] [address] [place], [place] [address]  Comprehensive metabolic panel     Status: Abnormal   Collection Time: 03/28/19  1:55 PM  Result Value Ref Range   Sodium 127 (L) 135 - 145 mmol/L   Potassium 4.4 3.5 - 5.1 mmol/L   Chloride 90 (L) 98 - 111 mmol/L   CO2 25 22 - 32 mmol/L   Glucose, Bld 103 (H) 70 - 99 mg/dL   BUN 56 (H) 8 - 23 mg/dL   Creatinine, Ser 2.25 (H) 0.44 - 1.00 mg/dL   Calcium 8.9 8.9 - 10.3 mg/dL   Total Protein 6.8 6.5 - 8.1 g/dL   Albumin 2.6 (L)  3.5 - 5.0 g/dL   AST 55 (H) 15 - 41 U/L   ALT 34 0 - 44 U/L   Alkaline Phosphatase 118 38 - 126 U/L   Total Bilirubin 1.7 (H) 0.3 - 1.2 mg/dL   GFR calc non Af Amer 22 (L) >60 mL/min   GFR calc Af Amer 26 (L) >60 mL/min   Anion gap 12 5 - 15    Comment: Performed at [facility], [place] [address], [place], [place] [address]  CBC WITH DIFFERENTIAL     Status: Abnormal   Collection Time: 03/28/19  1:55 PM  Result Value Ref Range   WBC 20.8 (H) 4.0 - 10.5 K/uL   RBC 3.63 (L) 3.87 - 5.11 MIL/uL   Hemoglobin 10.5 (L) 12.0 - 15.0 g/dL   HCT 33.2 (L) 36.0 - 46.0 %   MCV 91.5 80.0 - 100.0 fL   MCH 28.9 26.0 - 34.0 pg   MCHC 31.6 30.0 - 36.0 g/dL   RDW 18.6 (H) 11.5 - 15.5 %   Platelets 194 150 - 400 K/uL   nRBC 0.0 0.0 - 0.2 %   Neutrophils Relative % 97 %   Neutro Abs 20.2 (H) 1.7 - 7.7 K/uL   Lymphocytes Relative 2 %   Lymphs Abs 0.4 (L) 0.7 - 4.0 K/uL   Monocytes Relative 1 %   Monocytes Absolute 0.2 0.1 - 1.0 K/uL   Eosinophils Relative 0 %   Eosinophils Absolute 0.0 0.0 - 0.5 K/uL   Basophils Relative 0 %   Basophils Absolute 0.0 0.0 - 0.1 K/uL   WBC Morphology TOXIC GRANULATION    Abs Immature Granulocytes 0.00 0.00 - 0.07 K/uL   Polychromasia PRESENT     Comment: Performed at [facility], [place] [address], [place], [place] [address]  Blood Culture (routine x 2)     Status: None (Preliminary result)   Collection Time: 03/28/19  1:55 PM   Specimen: BLOOD  Result Value Ref Range   Specimen Description BLOOD SITE NOT SPECIFIED    Special Requests      BOTTLES DRAWN AEROBIC AND ANAEROBIC Blood Culture results may not be optimal due to an inadequate volume of blood received in culture bottles   Culture      NO GROWTH <12 HOURS Performed at [facility], [place] [address], [place], [place] [address]    Report Status PENDING   Protime-INR     Status: Abnormal   Collection Time: 03/28/19  1:55 PM  Result Value Ref Range   Prothrombin Time 33.7 (H) 11.4 - 15.2  seconds   INR 3.4 (H) 0.8 - 1.2  Comment: (NOTE) INR goal varies based on device and disease states. Performed at [facility], [address] [place], [place] [address]   Blood Culture (routine x 2)     Status: None (Preliminary result)   Collection Time: 03/28/19  2:36 PM   Specimen: BLOOD  Result Value Ref Range   Specimen Description BLOOD LEFT ANTECUBITAL    Special Requests      BOTTLES DRAWN AEROBIC AND ANAEROBIC Blood Culture adequate volume   Culture      NO GROWTH <12 HOURS Performed at [facility], [address] [place], [place] [address]    Report Status PENDING    Dg Chest Portable 1 View  Result Date: 03/28/2019 CLINICAL DATA:  Lower extremity edema EXAM: PORTABLE CHEST 1 VIEW COMPARISON:  October 04, 2009 FINDINGS: There is mild cardiomegaly with pulmonary vascularity within normal limits. There is atelectatic change in the left base. There is no frank consolidation or edema. There is aortic atherosclerosis. No adenopathy. No bone lesions. IMPRESSION: Mild cardiomegaly. Atelectasis left base without edema or consolidation. Aortic Atherosclerosis (ICD10-I70.0). Electronically Signed   By: Bretta BangWilliam  Woodruff III M.D.   On: 03/28/2019 14:07    Pending Labs Unresulted Labs (From admission, onward)    Start     Ordered   03/28/19 1403  SARS Coronavirus 2 (CEPHEID - Performed in [facility] lab), Hosp Order  (Asymptomatic Patients Labs)  Once,   STAT    Question:  Rule Out  Answer:  Yes   03/28/19 1402   03/28/19 1348  Lactic acid, plasma  Now then every 2 hours,   STAT     03/28/19 1347   03/28/19 1348  Urinalysis, Routine w reflex microscopic  ONCE - STAT,   STAT     03/28/19 1347          Vitals/Pain Today's Vitals   03/28/19 1331 03/28/19 1345 03/28/19 1400 03/28/19 1537  BP:  (!) 90/55 (!) 105/36 99/77  Pulse:    96  Resp:  (!) 21 (!) 30 19  Temp:      TempSrc:      SpO2:    100%  Weight: (!) 147.4 kg     Height:      PainSc:         Isolation Precautions No active isolations  Medications Medications  nystatin (MYCOSTATIN/NYSTOP) topical powder (has no administration in time range)  sodium chloride 0.9 % bolus 1,000 mL (0 mLs Intravenous Stopped 03/28/19 1500)    And  sodium chloride 0.9 % bolus 800 mL (800 mLs Intravenous New Bag/Given 03/28/19 1531)  ceFAZolin (ANCEF) IVPB 2g/100 mL premix (0 g Intravenous Stopped 03/28/19 1442)  HYDROcodone-acetaminophen (NORCO/VICODIN) 5-325 MG per tablet 1 tablet (1 tablet Oral Given 03/28/19 1413)    Mobility non-ambulatory High fall risk   Focused Assessments Cardiac Assessment Handoff:    Lab Results  Component Value Date   CKTOTAL 423 (H) 09/30/2009   CKMB 8.1 (H) 09/30/2009   TROPONINI <0.03 01/08/2019   No results found for: DDIMER Does the Patient currently have chest pain? No     R Recommendations: See Admitting Provider Note  Report given to:   Additional Notes: Here for lower leg edema; + redness. Lactic 1.5; WBC 20+; BC x2 done; IVF given + ABX. 20g RAC.

## 2019-03-28 NOTE — ED Provider Notes (Signed)
[facility] Provider Note   CSN: [id number] Arrival date & time: 03/28/19  1306     History   Chief Complaint Chief Complaint  Patient presents with  . Leg Swelling    HPI Amy Bryant is a 63 y.o. female who presents with leg swelling. PMH significant for morbid obesity, history of PE on Coumadin, CHF, COPD, DM, hx of A.fib, hx of anemia.  Patient states that for the past 2 weeks she has had swelling of her bilateral lower extremities.  She takes Lasix daily for this is on chronic oxygen therapy (4L via [place]).  Over the past several days she has been getting worsening pain in the right lower extremity with associated redness.  The redness is going up to her thigh.  She has several wounds on the leg which are malodorous and weeping.  She missed a couple days of her Lasix and has been sleeping on a recliner which has been worsening the swelling.  She denies any fever, chills, shortness of breath, chest pain or cough.  She has been lightheaded and has a home health nurse who takes care of her and they advised her to come to the emergency department.  She does not have a history of low blood counts and required blood transfusions and iron transfusion during her last admission.  Her home health nurse was able to draw a CBC which showed a leukocytosis of 15 and hemoglobin of 10.7.     HPI  Past Medical History:  Diagnosis Date  . Atrial fibrillation (HCC)    with cardioversion success  . CHF (congestive heart failure) (HCC)   . COPD (chronic obstructive pulmonary disease) (HCC)   . Cor pulmonale (HCC)    and obesity hypoventilation syndrome  . Diabetes mellitus without complication (HCC)   . GERD (gastroesophageal reflux disease)   . Hypothyroid   . Morbid obesity (HCC)   . OA (osteoarthritis)   . Pulmonary embolism (HCC)    hx of on coumadin  . Reactive airways dysfunction syndrome [facility](HCC)     Patient Active Problem List   Diagnosis Date Noted   . Panic attacks 01/20/2019  . Iron deficiency anemia: Severe 01/14/2019  . Pressure injury of skin 01/14/2019  . Symptomatic anemia   . Coagulopathy (HCC)   . Chronic anticoagulation   . Absolute anemia 01/08/2019  . Reactive depression (situational) 12/29/2018  . Mobility impaired 12/12/2018  . Hematuria 12/03/2017  . Pedal edema 12/23/2016  . OSA (obstructive sleep apnea) 12/21/2016  . Financial difficulties 09/16/2016  . Colon cancer screening 12/15/2013  . Encounter for therapeutic drug monitoring 11/05/2013  . Routine general medical examination at a health care facility 05/20/2012  . Atrial fibrillation (HCC) 11/23/2010  . History of pulmonary embolism 11/23/2010  . Vitamin D deficiency 11/15/2010  . DYSURIA, HX OF 08/19/2009  . PURE HYPERCHOLESTEROLEMIA 08/16/2009  . ATRIAL FIBRILLATION, HX OF 12/03/2008  . Diabetes type 2, controlled (HCC) 08/13/2008  . Hyperparathyroidism (HCC) 07/30/2008  . Hypothyroidism 02/13/2008  . Morbid obesity (HCC) 02/04/2008  . Reactive airway disease 02/04/2008  . GERD 02/04/2008  . DISC DISEASE, LUMBAR 02/04/2008  . PULMONARY EMBOLISM, HX OF 02/04/2008  . TOBACCO USE, QUIT 02/04/2008  . Obesity hypoventilation syndrome (HCC) 08/11/2007  . Chronic pulmonary heart disease (HCC) 08/11/2007    Past Surgical History:  Procedure Laterality Date  . BIOPSY  01/12/2019   Procedure: BIOPSY;  Surgeon: Beverley FiedlerPyrtle, Jay M, MD;  [facility];  Service:  Gastroenterology;;  . COLONOSCOPY WITH PROPOFOL N/A 01/12/2019   Procedure: COLONOSCOPY WITH PROPOFOL;  Surgeon: Beverley FiedlerPyrtle, Jay M, MD;  [facility];  Service: Gastroenterology;  Laterality: N/A;  . ESOPHAGOGASTRODUODENOSCOPY (EGD) WITH PROPOFOL N/A 01/12/2019   Procedure: ESOPHAGOGASTRODUODENOSCOPY (EGD) WITH PROPOFOL;  Surgeon: Beverley FiedlerPyrtle, Jay M, MD;  [facility];  Service: Gastroenterology;  Laterality: N/A;  . TONSILLECTOMY       OB History   No obstetric history on file.       Home Medications    Prior to Admission medications   Medication Sig Start Date End Date Taking? Authorizing Provider  albuterol (PROVENTIL) (2.5 MG/3ML) 0.083% nebulizer solution INHALE 1 VIAL VIA NEBULIZER EVERY 4 HOURS AS NEEDED FOR WHEEZING 03/10/19   [name], Audrie GallusMarne A, MD  ALPRAZolam Prudy Feeler(XANAX) 0.5 MG tablet Take 1 tablet (0.5 mg total) by mouth 2 (two) times daily as needed for anxiety (panic attack). 01/20/19   [name], Audrie GallusMarne A, MD  budesonide-formoterol (SYMBICORT) 160-4.5 MCG/ACT inhaler INHALE 2 PUFFS INTO LUNGS TWICE A DAY Patient taking differently: Inhale 2 puffs into the lungs 2 (two) times daily.  01/02/19   [name], Audrie GallusMarne A, MD  cholecalciferol (VITAMIN D) 1000 UNITS tablet Take 4,000 Units by mouth daily.    [provider]  ciclopirox (LOPROX) 0.77 % cream APPLY TO AFFECTED AREA TWICE A DAY AS NEEDED Patient taking differently: Apply 1 application topically 2 (two) times daily as needed (skin care).  10/16/18   [name], Audrie GallusMarne A, MD  diclofenac (VOLTAREN) 75 MG EC tablet TAKE ONE TABLET BY MOUTH TWICE DAILY AS NEEDED FOR PAIN WITH FOOD. 03/18/19   [name], Audrie GallusMarne A, MD  diltiazem (CARTIA XT) 120 MG 24 hr capsule Take 1 capsule (120 mg total) by mouth daily. 12/03/17   [name], Audrie GallusMarne A, MD  diltiazem (TIAZAC) 120 MG 24 hr capsule TAKE 1 CAPSULE BY MOUTH EVERY DAY 01/30/19   [name], Audrie GallusMarne A, MD  FLUoxetine (PROZAC) 40 MG capsule Take 1 capsule (40 mg total) by mouth daily. 01/20/19   [name], Audrie GallusMarne A, MD  furosemide (LASIX) 80 MG tablet TAKE 1 TABLET (80 MG TOTAL) BY MOUTH 2 (TWO) TIMES DAILY. 03/18/19   [name], Audrie GallusMarne A, MD  glipiZIDE (GLUCOTROL XL) 5 MG 24 hr tablet Take 1 tablet (5 mg total) by mouth daily with breakfast. 01/15/19   [name], Audrie GallusMarne A, MD  glucose blood test strip Test blood sugar once daily and as needed for DM 250.0 11/19/14   [name], Audrie GallusMarne A, MD  iron polysaccharides (NU-IRON) 150 MG capsule Take 1 capsule (150 mg total) by mouth 2 (two) times daily. 03/19/19   [name], Audrie GallusMarne A, MD  Lancets  [facility](ONETOUCH ULTRASOFT) lancets Test blood sugar once daily and as needed for DM 250.0     [provider]  levothyroxine (SYNTHROID, LEVOTHROID) 175 MCG tablet Take 1 tablet (175 mcg total) by mouth daily before breakfast. 01/14/19   [name], Audrie GallusMarne A, MD  metFORMIN (GLUCOPHAGE) 1000 MG tablet TAKE 1 TABLET BY MOUTH TWICE DAILY WITH A MEAL 03/18/19   [name], Marne A, MD  mometasone (ELOCON) 0.1 % cream Apply 1 application topically See admin instructions. Tiny amount to each ear canal twice weekly as needed.    [provider]  mupirocin ointment (BACTROBAN) 2 % APPLY TO AFFECTED AREA TWICE A DAY Patient taking differently: Apply 1 application topically 2 (two) times daily.  01/01/19   [name], Audrie GallusMarne A, MD  pantoprazole (PROTONIX) 40 MG tablet Take 1 tablet (40 mg total) by mouth daily  at 6 (six) AM. 02/06/19   [name], Audrie GallusMarne A, MD  potassium chloride SA (KLOR-CON M20) 20 MEQ tablet TAKE 2 TABLETS BY MOUTH EVERY DAY Patient taking differently: Take 40 mEq by mouth daily.  01/02/19   [name], Audrie GallusMarne A, MD  simvastatin (ZOCOR) 20 MG tablet TAKE 1 TABLET BY MOUTH EVERYDAY AT BEDTIME 03/19/19   [name], FordyceMarne A, MD  traMADol (ULTRAM) 50 MG tablet 1TAB BY MOUTH EVERY 12HRS AS NEEDED FOR MODERATE/SEVERE PAIN. CAUTION OF SEDATION/FALLS/CONSTIPATION 01/23/19   [name], Audrie GallusMarne A, MD  warfarin (COUMADIN) 5 MG tablet Take 1-1.5 tablets (5-7.5 mg total) by mouth daily at 6 PM. Take 1.5 tablets (7.5mg ) today, then 1 tablet (5mg ) daily then AS DIRECTED BY THE ANTICOAGULATION CLINIC. 01/14/19   Rodolph Bonghompson, Daniel V, MD    Family History Family History  Problem Relation Age of Onset  . Heart disease Father 1951       MI  . Diabetes Brother     Social History Social History   Tobacco Use  . Smoking status: Former Games developermoker  . Smokeless tobacco: Former Engineer, waterUser  Substance Use Topics  . Alcohol use: No    Alcohol/week: 0.0 standard drinks  . Drug use: No     Allergies   Pseudoephedrine, Augmentin [amoxicillin-pot  clavulanate], and Latex   Review of Systems Review of Systems  Constitutional: Positive for appetite change. Negative for fever.  Respiratory: Negative for cough and shortness of breath.   Cardiovascular: Positive for leg swelling. Negative for chest pain.  Gastrointestinal: Positive for nausea. Negative for abdominal pain, diarrhea and vomiting.  Musculoskeletal: Positive for arthralgias and myalgias.  Skin: Positive for color change and wound.  Neurological: Positive for light-headedness. Negative for syncope.  All other systems reviewed and are negative.    Physical Exam Updated Vital Signs Pulse 99   Temp 99.4 F (37.4 C) (Oral)   Resp 19   Ht 5\' 5"  (1.651 m)   Wt (!) 147.4 kg   SpO2 99% Comment: 6 liters nasal O2  BMI 54.08 kg/m   Physical Exam Vitals signs and nursing note reviewed.  Constitutional:      General: She is not in acute distress.    Appearance: She is well-developed. She is obese. She is ill-appearing.     Comments: Obese, chronically ill-appearing disheveled female in mild distress  HENT:     Head: Normocephalic and atraumatic.  Eyes:     General: No scleral icterus.       Right eye: No discharge.        Left eye: No discharge.     Conjunctiva/sclera: Conjunctivae normal.     Pupils: Pupils are equal, round, and reactive to light.  Neck:     Musculoskeletal: Normal range of motion.  Cardiovascular:     Rate and Rhythm: Normal rate and regular rhythm.  Pulmonary:     Effort: Pulmonary effort is normal. No respiratory distress.     Breath sounds: Normal breath sounds.  Abdominal:     General: There is no distension.     Palpations: Abdomen is soft.     Tenderness: There is no abdominal tenderness.  Genitourinary:    Comments: Intertrigo of the groin area Musculoskeletal:     Comments: Diffuse erythema and swelling of the RLE. There are several weeping wounds. The leg is warm and tender to touch. No obvious fluctuant abscess. There is wet,  macerated skin in the skin folds of the knee and thigh  Skin:    General:  Skin is warm and dry.  Neurological:     Mental Status: She is alert and oriented to person, place, and time.  Psychiatric:        Behavior: Behavior normal.        ED Treatments / Results  Labs (all labs ordered are listed, but only abnormal results are displayed) Labs Reviewed  CBC WITH DIFFERENTIAL/PLATELET - Abnormal; Notable for the following components:      Result Value   WBC 20.8 (*)    RBC 3.63 (*)    Hemoglobin 10.5 (*)    HCT 33.2 (*)    RDW 18.6 (*)    All other components within normal limits  CULTURE, BLOOD (ROUTINE X 2)  CULTURE, BLOOD (ROUTINE X 2)  SARS CORONAVIRUS 2 (HOSPITAL ORDER, PERFORMED IN [facility])  LACTIC ACID, PLASMA  LACTIC ACID, PLASMA  COMPREHENSIVE METABOLIC PANEL  URINALYSIS, ROUTINE W REFLEX MICROSCOPIC  PROTIME-INR    EKG None  Radiology Dg Chest Portable 1 View  Result Date: 03/28/2019 CLINICAL DATA:  Lower extremity edema EXAM: PORTABLE CHEST 1 VIEW COMPARISON:  October 04, 2009 FINDINGS: There is mild cardiomegaly with pulmonary vascularity within normal limits. There is atelectatic change in the left base. There is no frank consolidation or edema. There is aortic atherosclerosis. No adenopathy. No bone lesions. IMPRESSION: Mild cardiomegaly. Atelectasis left base without edema or consolidation. Aortic Atherosclerosis (ICD10-I70.0). Electronically Signed   By: Bretta Bang III M.D.   On: 03/28/2019 14:07    Procedures Procedures (including critical care time)  Medications Ordered in ED Medications  sodium chloride 0.9 % bolus 1,000 mL (1,000 mLs Intravenous New Bag/Given 03/28/19 1415)    And  sodium chloride 0.9 % bolus 800 mL (800 mLs Intravenous New Bag/Given 03/28/19 1531)  nystatin (MYCOSTATIN/NYSTOP) topical powder (has no administration in time range)  ceFAZolin (ANCEF) IVPB 2g/100 mL premix (2 g Intravenous New Bag/Given 03/28/19  1412)  HYDROcodone-acetaminophen (NORCO/VICODIN) 5-325 MG per tablet 1 tablet (1 tablet Oral Given 03/28/19 1413)     Initial Impression / Assessment and Plan / ED Course  I have reviewed the triage vital signs and the nursing notes.  Pertinent labs & imaging results that were available during my care of the patient were reviewed by me and considered in my medical decision making (see chart for details).  63 year old chronically ill female presents with worsening leg swelling, redness and pain of the right lower extremity.  She is hypotensive and mildly tachypneic.  She is also hypoxic and O2 was increased to 6 L/min.  She is afebrile here and borderline tachycardic.  In the setting of obvious skin infection and abnormal vital signs code sepsis was initiated.  Order set was utilized.  Anticipate admission therefore COVID test was ordered as well.  30 cc/kg bolus and antibiotics ordered.  Shared visit with Dr. Pilar Plate.  CBC is remarkable for significant leukocytosis of 20.8. CMP is remarkable for hyponatremia (127), hypochloremia (90), AKI (BUN 56 and Cr 2.25), slight elevation of bilirubin (1.7). Lactate is normal. CXR is negative. EKG shows rate controlled A.fib. Will admit to medicine.  Discussed with Dr. Katrinka Blazing with [facility] who will admit.  Final Clinical Impressions(s) / ED Diagnoses   Final diagnoses:  Sepsis with acute renal failure, due to unspecified organism, unspecified acute renal failure type, unspecified whether septic shock present [facility])  Cellulitis of right lower extremity    ED Discharge Orders    None       Bethel Born,  PA-C 03/28/19 1554    Maudie Flakes, MD 04/01/19 [phone]

## 2019-03-28 NOTE — H&P (Signed)
History and Physical    Amy ShellingDeborah G Scales ZOX:[id number] DOB: 06-Jun-1956 DOA: 03/28/2019  Referring MD/NP/PA: Terance HartKelly Gekas, PA-C PCP: Judy Pimpleower, Marne A, MD  Patient coming from: Home  Chief Complaint: Leg pain  I have personally briefly reviewed patient's [facility]   HPI: Amy Bryant is a 63 y.o. female with medical history significant of A. fib on Eliquis, CHF, COPD, chronic respiratory failure on 4 L, morbid obesity, and hypothyroidism; who presents with complaints swelling and pain in her bilateral lower extremities.  Over the last 4 days she has had increased redness and erythema spreading up her right leg.  She reports that she has several wounds on the leg that have been weeping foul-smelling fluid.  She reports missing a couple days of her Lasix and has been sleeping in a recliner without elevating her legs.  Other associated symptoms include nausea with decreased food intake, sore throat, burning with urination, and productive cough with greenish sputum production.  Denies having any vomiting, fever, diarrhea  ED Course: Upon admission to the emergency department patient was seen to have a temperature of 99.4 F, pulse 96-99, respiration 19-30, blood pressure 90 over 55-105/36, and O2 saturations maintained on room air.  Labs significant for WBC 20.8, hemoglobin 10.5, BUN 56, creatinine 2.25, INR 3.4, and initial lactic acid 1.5.  Chest x-ray showing left-sided atelectasis with cardiomegaly without signs of consolidation or edema.  Sepsis protocol initiated with patient given 1.8 L of normal saline IV fluids and empiric antibiotics of Ancef.  Review of Systems  Constitutional: Positive for malaise/fatigue. Negative for fever.  HENT: Positive for sore throat. Negative for hearing loss.   Eyes: Negative for pain.  Respiratory: Positive for cough and sputum production.   Cardiovascular: Positive for leg swelling. Negative for chest pain.  Gastrointestinal:  Positive for nausea. Negative for abdominal pain, diarrhea and vomiting.  Genitourinary: Positive for dysuria and flank pain.  Musculoskeletal: Positive for back pain and myalgias.  Skin: Positive for rash.  Neurological: Negative for focal weakness and loss of consciousness.  Psychiatric/Behavioral: Negative for memory loss and substance abuse.    Past Medical History:  Diagnosis Date  . Atrial fibrillation (HCC)    with cardioversion success  . CHF (congestive heart failure) (HCC)   . COPD (chronic obstructive pulmonary disease) (HCC)   . Cor pulmonale (HCC)    and obesity hypoventilation syndrome  . Diabetes mellitus without complication (HCC)   . GERD (gastroesophageal reflux disease)   . Hypothyroid   . Morbid obesity (HCC)   . OA (osteoarthritis)   . Pulmonary embolism (HCC)    hx of on coumadin  . Reactive airways dysfunction syndrome [facility](HCC)     Past Surgical History:  Procedure Laterality Date  . BIOPSY  01/12/2019   Procedure: BIOPSY;  Surgeon: Beverley FiedlerPyrtle, Jay M, MD;  [facility];  Service: Gastroenterology;;  . COLONOSCOPY WITH PROPOFOL N/A 01/12/2019   Procedure: COLONOSCOPY WITH PROPOFOL;  Surgeon: Beverley FiedlerPyrtle, Jay M, MD;  [facility];  Service: Gastroenterology;  Laterality: N/A;  . ESOPHAGOGASTRODUODENOSCOPY (EGD) WITH PROPOFOL N/A 01/12/2019   Procedure: ESOPHAGOGASTRODUODENOSCOPY (EGD) WITH PROPOFOL;  Surgeon: Beverley FiedlerPyrtle, Jay M, MD;  [facility];  Service: Gastroenterology;  Laterality: N/A;  . TONSILLECTOMY       reports that she has quit smoking. She has quit using smokeless tobacco. She reports that she does not drink alcohol or use drugs.  Allergies  Allergen Reactions  . Pseudoephedrine Other (See Comments)  REACTION: tightness in chest  . Augmentin [Amoxicillin-Pot Clavulanate] Itching and Rash    Did it involve swelling of the face/tongue/throat, SOB, or low BP? No Did it involve sudden or severe rash/hives, skin peeling, or any  reaction on the inside of your mouth or nose? Yes Did you need to seek medical attention at a hospital or doctor's office? Yes When did it last happen?2019 If all above answers are "NO", may proceed with cephalosporin use.   . Latex Itching and Rash    Family History  Problem Relation Age of Onset  . Heart disease Father [id number]       MI  . Diabetes Brother     Prior to Admission medications   Medication Sig Start Date End Date Taking? Authorizing Provider  albuterol (PROVENTIL) (2.5 MG/3ML) 0.083% nebulizer solution INHALE 1 VIAL VIA NEBULIZER EVERY 4 HOURS AS NEEDED FOR WHEEZING 03/10/19  Yes [name], Audrie GallusMarne A, MD  ALPRAZolam (XANAX) 0.5 MG tablet Take 1 tablet (0.5 mg total) by mouth 2 (two) times daily as needed for anxiety (panic attack). 01/20/19  Yes [name], Audrie GallusMarne A, MD  budesonide-formoterol (SYMBICORT) 160-4.5 MCG/ACT inhaler INHALE 2 PUFFS INTO LUNGS TWICE A DAY Patient taking differently: Inhale 2 puffs into the lungs 2 (two) times daily.  01/02/19  Yes [name], Audrie GallusMarne A, MD  cholecalciferol (VITAMIN D) 1000 UNITS tablet Take 4,000 Units by mouth daily.   Yes [provider]  ciclopirox (LOPROX) 0.77 % cream APPLY TO AFFECTED AREA TWICE A DAY AS NEEDED Patient taking differently: Apply 1 application topically 2 (two) times daily as needed (skin care under breast).  10/16/18  Yes [name], Audrie GallusMarne A, MD  diclofenac (VOLTAREN) 75 MG EC tablet TAKE ONE TABLET BY MOUTH TWICE DAILY AS NEEDED FOR PAIN WITH FOOD. Patient taking differently: Take 75 mg by mouth 2 (two) times daily.  03/18/19  Yes [name], Audrie GallusMarne A, MD  diltiazem (CARTIA XT) 120 MG 24 hr capsule Take 1 capsule (120 mg total) by mouth daily. 12/03/17  Yes [name], Audrie GallusMarne A, MD  FLUoxetine (PROZAC) 40 MG capsule Take 1 capsule (40 mg total) by mouth daily. 01/20/19  Yes [name], Audrie GallusMarne A, MD  furosemide (LASIX) 80 MG tablet TAKE 1 TABLET (80 MG TOTAL) BY MOUTH 2 (TWO) TIMES DAILY. 03/18/19  Yes [name], Audrie GallusMarne A, MD  glipiZIDE (GLUCOTROL XL) 5  MG 24 hr tablet Take 1 tablet (5 mg total) by mouth daily with breakfast. 01/15/19  Yes [name], Audrie GallusMarne A, MD  iron polysaccharides (NU-IRON) 150 MG capsule Take 1 capsule (150 mg total) by mouth 2 (two) times daily. 03/19/19  Yes [name], Audrie GallusMarne A, MD  levothyroxine (SYNTHROID, LEVOTHROID) 175 MCG tablet Take 1 tablet (175 mcg total) by mouth daily before breakfast. 01/14/19  Yes [name], Audrie GallusMarne A, MD  metFORMIN (GLUCOPHAGE) 1000 MG tablet TAKE 1 TABLET BY MOUTH TWICE DAILY WITH A MEAL Patient taking differently: Take 1,000 mg by mouth 2 (two) times daily with a meal.  03/18/19  Yes [name], Marne A, MD  mometasone (ELOCON) 0.1 % cream Apply 1 application topically See admin instructions. Tiny amount to each ear canal twice weekly as needed.   Yes [provider]  mupirocin ointment (BACTROBAN) 2 % APPLY TO AFFECTED AREA TWICE A DAY Patient taking differently: Apply 1 application topically as needed (skin).  01/01/19  Yes [name], Audrie GallusMarne A, MD  pantoprazole (PROTONIX) 40 MG tablet Take 1 tablet (40 mg total) by mouth daily at 6 (six) AM. 02/06/19  Yes [name], [employer]	[place] A,  MD  potassium chloride SA (KLOR-CON M20) 20 MEQ tablet TAKE 2 TABLETS BY MOUTH EVERY DAY Patient taking differently: Take 40 mEq by mouth daily.  01/02/19  Yes [name], Audrie GallusMarne A, MD  simvastatin (ZOCOR) 20 MG tablet TAKE 1 TABLET BY MOUTH EVERYDAY AT BEDTIME Patient taking differently: Take 20 mg by mouth daily at 6 PM.  03/19/19  Yes [name], Audrie GallusMarne A, MD  traMADol (ULTRAM) 50 MG tablet 1TAB BY MOUTH EVERY 12HRS AS NEEDED FOR MODERATE/SEVERE PAIN. CAUTION OF SEDATION/FALLS/CONSTIPATION Patient taking differently: Take 50-100 mg by mouth every 6 (six) hours as needed for moderate pain.  01/23/19  Yes [name], Audrie GallusMarne A, MD  warfarin (COUMADIN) 5 MG tablet Take 1-1.5 tablets (5-7.5 mg total) by mouth daily at 6 PM. Take 1.5 tablets (7.5mg ) today, then 1 tablet (5mg ) daily then AS DIRECTED BY THE ANTICOAGULATION CLINIC. Patient taking differently: Take 2.5-5 mg by  mouth See admin instructions. 2.5 mg on Mon. Wed and Friday 5 mg on Tues, Thurs, Saturday and Sunday in the eveining 01/14/19  Yes Rodolph Bonghompson, Daniel V, MD  diltiazem [facility](TIAZAC) 120 MG 24 hr capsule TAKE 1 CAPSULE BY MOUTH EVERY DAY Patient not taking: Reported on 03/28/2019 01/30/19   Roxy Mannsower, Marne A, MD  glucose blood test strip Test blood sugar once daily and as needed for DM 250.0 11/19/14   [name], Audrie GallusMarne A, MD  Lancets [facility](ONETOUCH ULTRASOFT) lancets Test blood sugar once daily and as needed for DM 250.0     [provider]    Physical Exam:  Constitutional: Obese female who appears to be acutely ill Vitals:   03/28/19 1331 03/28/19 1345 03/28/19 1400 03/28/19 1537  BP:  (!) 90/55 (!) 105/36 99/77  Pulse:    96  Resp:  (!) 21 (!) 30 19  Temp:      TempSrc:      SpO2:    100%  Weight: (!) 147.4 kg     Height:       Eyes: PERRL, lids and conjunctivae normal ENMT: Mucous membranes are moist. Posterior pharynx clear of any exudate or lesions. Neck: normal, supple, no masses, no thyromegaly Respiratory: Decreased overall aeration along with tachypneic.  Patient on 5 L nasal cannula oxygen currently. Cardiovascular: Regular rate and rhythm, no murmurs / rubs / gallops. No extremity edema. 2+ pedal pulses. No carotid bruits.  Abdomen: no tenderness, no masses palpated. No hepatosplenomegaly. Bowel sounds positive.  Musculoskeletal: no clubbing / cyanosis. No joint deformity upper and lower extremities. Good ROM, no contractures. Normal muscle tone.  Skin: Erythema tracking up the right thigh with multiple wounds noted Neurologic: CN 2-12 grossly intact. Sensation intact, DTR normal. Strength 5/5 in all 4.  Psychiatric: Normal judgment and insight. Alert and oriented x 3. Normal mood.     Labs on Admission: I have personally reviewed following labs and imaging studies  CBC: Recent Labs  Lab 03/28/19 1355  WBC 20.8*  NEUTROABS 20.2*  HGB 10.5*  HCT 33.2*  MCV 91.5  PLT 194    Basic Metabolic Panel: Recent Labs  Lab 03/28/19 1355  NA 127*  K 4.4  CL 90*  CO2 25  GLUCOSE 103*  BUN 56*  CREATININE 2.25*  CALCIUM 8.9   GFR: Estimated Creatinine Clearance: 37.7 mL/min (A) (by C-G formula based on SCr of 2.25 mg/dL (H)). Liver Function Tests: Recent Labs  Lab 03/28/19 1355  AST 55*  ALT 34  ALKPHOS 118  BILITOT 1.7*  PROT 6.8  ALBUMIN 2.6*   No  results for input(s): LIPASE, AMYLASE in the last 168 hours. No results for input(s): AMMONIA in the last 168 hours. Coagulation Profile: Recent Labs  Lab 03/28/19 1355  INR 3.4*   Cardiac Enzymes: No results for input(s): CKTOTAL, CKMB, CKMBINDEX, TROPONINI in the last 168 hours. BNP (last 3 results) No results for input(s): PROBNP in the last 8760 hours. HbA1C: No results for input(s): HGBA1C in the last 72 hours. CBG: No results for input(s): GLUCAP in the last 168 hours. Lipid Profile: No results for input(s): CHOL, HDL, LDLCALC, TRIG, CHOLHDL, LDLDIRECT in the last 72 hours. Thyroid Function Tests: No results for input(s): TSH, T4TOTAL, FREET4, T3FREE, THYROIDAB in the last 72 hours. Anemia Panel: No results for input(s): VITAMINB12, FOLATE, FERRITIN, TIBC, IRON, RETICCTPCT in the last 72 hours. Urine analysis:    Component Value Date/Time   COLORURINE YELLOW 01/09/2019 1024   APPEARANCEUR CLEAR 01/09/2019 1024   LABSPEC 1.010 01/09/2019 1024   PHURINE 7.0 01/09/2019 1024   GLUCOSEU NEGATIVE 01/09/2019 1024   GLUCOSEU NEGATIVE 02/21/2007 1652   HGBUR SMALL (A) 01/09/2019 1024   HGBUR negative 04/21/2010 1548   BILIRUBINUR NEGATIVE 01/09/2019 1024   BILIRUBINUR Negative 12/03/2017 1443   KETONESUR NEGATIVE 01/09/2019 1024   PROTEINUR NEGATIVE 01/09/2019 1024   UROBILINOGEN 0.2 12/03/2017 1443   UROBILINOGEN 0.2 04/21/2010 1548   NITRITE NEGATIVE 01/09/2019 1024   LEUKOCYTESUR NEGATIVE 01/09/2019 1024   Sepsis Labs: Recent Results (from the past 240 hour(s))  Blood Culture  (routine x 2)     Status: None (Preliminary result)   Collection Time: 03/28/19  1:55 PM   Specimen: BLOOD  Result Value Ref Range Status   Specimen Description BLOOD SITE NOT SPECIFIED  Final   Special Requests   Final    BOTTLES DRAWN AEROBIC AND ANAEROBIC Blood Culture results may not be optimal due to an inadequate volume of blood received in culture bottles   Culture   Final    NO GROWTH <12 HOURS Performed at [facility], [place] [address] [place], [place] [address]    Report Status PENDING  Incomplete  Blood Culture (routine x 2)     Status: None (Preliminary result)   Collection Time: 03/28/19  2:36 PM   Specimen: BLOOD  Result Value Ref Range Status   Specimen Description BLOOD LEFT ANTECUBITAL  Final   Special Requests   Final    BOTTLES DRAWN AEROBIC AND ANAEROBIC Blood Culture adequate volume   Culture   Final    NO GROWTH <12 HOURS Performed at [facility], [place] [address] [place], [place] [address]    Report Status PENDING  Incomplete     Radiological Exams on Admission: Dg Chest Portable 1 View  Result Date: 03/28/2019 CLINICAL DATA:  Lower extremity edema EXAM: PORTABLE CHEST 1 VIEW COMPARISON:  October 04, 2009 FINDINGS: There is mild cardiomegaly with pulmonary vascularity within normal limits. There is atelectatic change in the left base. There is no frank consolidation or edema. There is aortic atherosclerosis. No adenopathy. No bone lesions. IMPRESSION: Mild cardiomegaly. Atelectasis left base without edema or consolidation. Aortic Atherosclerosis (ICD10-I70.0). Electronically Signed   By: Lowella Grip III M.D.   On: 03/28/2019 14:07    EKG: Independently reviewed.  Atrial fibrillation at 98 bpm  Assessment/Plan Sepsis secondary to cellulitis: Patient presents with complaints of erythema and increased warmth of the right leg progressing up her thigh.  Patient was found to be tachypneic with low normal blood pressures and WBC  elevated at 20.8.   Lactic acid was noted to be within normal limits at 1.5.  Suspect right lower extremity cellulitis as cause of symptoms.  However, may be multifactorial given patient's other symptoms of cough and reports of dysuria. -Admit to a progressive bed -Follow-up blood, urine, sputum cultures/studies -Ancef IV  -Nystatin cream  -Wound care -Elevate lower extremities  Acute renal failure: Patient's baseline creatinine previously noted be within normal limits, but patient presents with creatinine 2.25 and BUN 56.  Elevated BUN to creatinine ratio suggest prerenal cause of symptoms. -Follow-up urinalysis once available. -Check CT scan of the abdomen and pelvis -Continue IV fluids as tolerated -Hold nephrotoxic agent including furosemide, Voltaren  Hypotension: acute. BP initally 90/55.  - IV fluids to maintain MAP greater than 65.  Paroxysmal atrial fibrillation, supratherapeutic INR: Patient currently rate controlled INR elevated at 3.4. CHA2DS2-VASc score = at least 3. -Restart diltiazem in a.m. if blood pressure stable in a.m. -Coumadin per pharmacy  Chronic respiratory failure with hypoxia, COPD: Patient notes some mild shortness of breath cough.  No significant wheezing appreciated on physical exam. -Continuous pulse oximetry with nasal cannula oxygen -Pharmacy substitution for Symbicort -Albuterol nebs as needed for shortness of breath/wheezing  Diastolic CHF: Last EF noted to be 60 to 65% on echocardiogram from 01/09/2019. -Strict intake and output -Daily weights  Diabetes mellitus type 2: Glucose 103 on admission.  Patient reports decreased appetite. -Check hemoglobin A1c -Hypoglycemic protocol -Hold metformin and glipizide -CBGs q. before meals and at bedtime with sensitive SSI  Hyponatremia: Acute. Sodium 127 on his admission. Suspect hypovolemic hyponatremia. -Recheck sodium in a.m.  Anxiety -Continue Prozac and Xanax as needed for  Hypothyroidism: Last TSH 2.667 on 01/08/2019  -Check TSH -Continue levothyroxine  Morbid obesity: BMI 55.4 kg/m  DVT prophylaxis: Coumadin Code Status: Full Family Communication: No family present at bedside Disposition Plan: Possible discharge home in 2 to 3 days Consults called: None Admission status: Inpatient  Clydie Braun MD [facility] Pager [phone]   If 7PM-7AM, please contact night-coverage [email or web address] Password [facility]  03/28/2019, 4:08 PM

## 2019-03-28 NOTE — Plan of Care (Signed)
Just started patient on General Care plan and will continue to monitor and evaluate her progress.

## 2019-03-28 NOTE — Progress Notes (Signed)
ANTICOAGULATION CONSULT NOTE - Initial Consult  Pharmacy Consult for warfarin Indication: atrial fibrillation and pulmonary embolus  Allergies  Allergen Reactions  . Pseudoephedrine Other (See Comments)    REACTION: tightness in chest  . Augmentin [Amoxicillin-Pot Clavulanate] Itching and Rash    Did it involve swelling of the face/tongue/throat, SOB, or low BP? No Did it involve sudden or severe rash/hives, skin peeling, or any reaction on the inside of your mouth or nose? Yes Did you need to seek medical attention at a hospital or doctor's office? Yes When did it last happen?2019 If all above answers are "NO", may proceed with cephalosporin use.   . Latex Itching and Rash    Patient Measurements: Height: 5\' 5"  (165.1 cm) Weight: (!) 325 lb (147.4 kg) IBW/kg (Calculated) : 57  Vital Signs: Temp: 99.4 F (37.4 C) (06/27 1311) Temp Source: Oral (06/27 1311) BP: 99/77 (06/27 1537) Pulse Rate: 96 (06/27 1537)  Labs: Recent Labs    03/28/19 1355  HGB 10.5*  HCT 33.2*  PLT 194  LABPROT 33.7*  INR 3.4*  CREATININE 2.25*    Estimated Creatinine Clearance: 37.7 mL/min (A) (by C-G formula based on SCr of 2.25 mg/dL (H)).   Medical History: Past Medical History:  Diagnosis Date  . Atrial fibrillation ([place])    with cardioversion success  . CHF (congestive heart failure) ([place])   . COPD (chronic obstructive pulmonary disease) ([place])   . Cor pulmonale (HCC)    and obesity hypoventilation syndrome  . Diabetes mellitus without complication ([place])   . GERD (gastroesophageal reflux disease)   . Hypothyroid   . Morbid obesity ([place])   . OA (osteoarthritis)   . Pulmonary embolism (HCC)    hx of on coumadin  . Reactive airways dysfunction syndrome (HCC)    Assessment: 68 yof presented to the ED with leg swelling. She is on chronic warfarin for history of afib and PE. INR is above goal at 3.4 today. Hgb is slightly low and platelets are WNL. No bleeding noted.  PTA  dose 5mg  MWF, 2.5mg  all other days  Goal of Therapy:  INR 2-3 Monitor platelets by anticoagulation protocol: Yes   Plan:  No warfarin today Daily INR  Michelangelo Rindfleisch, Rande Lawman 03/28/2019,4:41 PM

## 2019-03-29 DIAGNOSIS — R6520 Severe sepsis without septic shock: Secondary | ICD-10-CM

## 2019-03-29 DIAGNOSIS — N179 Acute kidney failure, unspecified: Secondary | ICD-10-CM

## 2019-03-29 LAB — CBC
HCT: 29 % — ABNORMAL LOW (ref 36.0–46.0)
Hemoglobin: 9.3 g/dL — ABNORMAL LOW (ref 12.0–15.0)
MCH: 29.2 pg (ref 26.0–34.0)
MCHC: 32.1 g/dL (ref 30.0–36.0)
MCV: 90.9 fL (ref 80.0–100.0)
Platelets: 187 10*3/uL (ref 150–400)
RBC: 3.19 MIL/uL — ABNORMAL LOW (ref 3.87–5.11)
RDW: 18.3 % — ABNORMAL HIGH (ref 11.5–15.5)
WBC: 19.2 10*3/uL — ABNORMAL HIGH (ref 4.0–10.5)
nRBC: 0 % (ref 0.0–0.2)

## 2019-03-29 LAB — GLUCOSE, CAPILLARY
Glucose-Capillary: 92 mg/dL (ref 70–99)
Glucose-Capillary: 90 mg/dL (ref 70–99)
Glucose-Capillary: 98 mg/dL (ref 70–99)
Glucose-Capillary: 258 mg/dL — ABNORMAL HIGH (ref 70–99)

## 2019-03-29 LAB — URINALYSIS, ROUTINE W REFLEX MICROSCOPIC
Bacteria, UA: NONE SEEN
Bilirubin Urine: NEGATIVE
Glucose, UA: 50 mg/dL — AB
Ketones, ur: NEGATIVE mg/dL
Nitrite: NEGATIVE
Protein, ur: 100 mg/dL — AB
RBC / HPF: >50 RBC/hpf — ABNORMAL HIGH (ref 0–5)
Specific Gravity, Urine: 1.01 (ref 1.005–1.030)
WBC, UA: >50 WBC/hpf — ABNORMAL HIGH (ref 0–5)
pH: 5 (ref 5.0–8.0)

## 2019-03-29 LAB — BASIC METABOLIC PANEL
Anion gap: 12 (ref 5–15)
BUN: 58 mg/dL — ABNORMAL HIGH (ref 8–23)
CO2: 23 mmol/L (ref 22–32)
Calcium: 8 mg/dL — ABNORMAL LOW (ref 8.9–10.3)
Chloride: 94 mmol/L — ABNORMAL LOW (ref 98–111)
Creatinine, Ser: 1.97 mg/dL — ABNORMAL HIGH (ref 0.44–1.00)
GFR calc Af Amer: 31 mL/min — ABNORMAL LOW (ref >60)
GFR calc non Af Amer: 26 mL/min — ABNORMAL LOW (ref >60)
Glucose, Bld: 86 mg/dL (ref 70–99)
Potassium: 3.9 mmol/L (ref 3.5–5.1)
Sodium: 129 mmol/L — ABNORMAL LOW (ref 135–145)

## 2019-03-29 LAB — PROTIME-INR
INR: 3.7 — ABNORMAL HIGH (ref 0.8–1.2)
Prothrombin Time: 36.2 seconds — ABNORMAL HIGH (ref 11.4–15.2)

## 2019-03-29 MED ORDER — CEFAZOLIN SODIUM-DEXTROSE 2-4 GM/100ML-% IV SOLN
2.0000 g | Freq: Three times a day (TID) | INTRAVENOUS | Status: DC
  Administered 2019-03-29 – 2019-03-31 (×6): 2 g via INTRAVENOUS
  Filled 2019-03-29 (×7): qty 100

## 2019-03-29 NOTE — Plan of Care (Signed)
Patient is progressing with her care plan. Wound consult completed today.

## 2019-03-29 NOTE — Progress Notes (Signed)
[facility] PROGRESS NOTE  Cephus ShellingDeborah G Diclemente ZOX:[id number] DOB: 02-16-1956 DOA: 03/28/2019 PCP: Judy Pimpleower, Marne A, MD  A/P Sepsis likely secondary to cellulitis Continue antibiotics-narrow as indicated based on blood cultures-monitor for worsening and marked out area of the wound with skin marking pencil to ensure that redness seems to improve Patient tells me she has had this issue before-need to keep legs elevated, areas dry, would recommend also consideration for Unna boots in the outpatient setting once area starts healing Wound nurse to come by and give an opinion later on today Blood pressure is not accurate given the size of the cuff and area where it was taken and I appreciate nursing rechecking with a larger sized cuff HFpEF EF 60-65% 01/09/2019 Atrial fibrillation permanent [place] score >3/Eliquis Home dose Lasix 80 twice daily held at this time Cardizem on hold given hypotension-recheck pressures later today--resume Coumadin per pharmacy recheck a.m. DM TY 2 on oral meds Hypothyroidism Holding metformin 1000 twice daily at this time, holding glipizide XL SSI coverage-monitor trends Supplement with 175 mcg every morning OSA on CPAP COPD Chronic respiratory failure/oxygen 4 L at home Patient has bruise to the bridge of nose secondary to chronic CPAP use-may need to be followed up outpatient with regards to change of mask Continue Symbicort 2 puff twice daily, albuterol/alternate in hospital-she has no wheeze at this time Recent symptomatic anemia hospitalization4/9-4/15 Baseline hemoglobin on discharge 7.5 currently 10.5 Monitor trends in house Acute urinary retention Unclear if secondary to infection/pain meds-obvious source of his leg-would not get UA at this Time Instead Just Place, Foley will need clamping trial a.m. BMI 56 Life-threatening-recommend outpatient discussion regarding nutrition, feasibility GOP/gastric sleeve?  Synopsis 62 ? Afib CHad2Vasc2 score~4  /elliquis HFpEF ef 60-65% 4.10.20 COPD/Chr Resp Failure + OSA on 4lpm O2 DM ty II Bipolar BMI 55.4 hypothryoid Chr LE wounds  Last admit with UGIB Hb 5.6-FOBT was neg-int hemorrhoids noted-diverticulosis-hiatal hernia  Came to ED with Tmax 99.4- wbc 20.8-bun/creat 56/2.2 up from baseline Sodium down 140--->127 CXR=L sided Atelectasis--Given Saline 1.8 L in ED   DVT coumadin   Code Status: full   Communication: none   Disposition Plan: Garen GramsIP    Lekeith Wulf, MD  [facility] [employer] app OR [email or web address] 7PM-7AM contact night coverage as above 03/29/2019, 7:21 AM  LOS: 1 day    Antimicrobials:  Cefazolin  Interval history/Subjective: Awake alert coherent no distress Feels better than yesterday Tells me she was dizzy No chest pain no cough no blurred vision no double vision Nursing informs me she is retaining over 700 cc of urine Also patient informs me she has not really been eating much over the past 3 days but instead has been drinking more than eating-she says that this is because of nausea   Objective:  Vitals:  Vitals:   03/29/19 0400 03/29/19 0714  BP: (!) 103/53   Pulse:    Resp: (!) 25 (!) 22  Temp:    SpO2:  93%    Exam:  Alert oriented, Mallampati 4 Cannot appreciate JVD, cannot appreciate carotids S1-S2 slightly tachycardic on monitors telemetry shows sinus tach with PVCs Abdomen obese unable to screen for organomegaly no tenderness however See pictures for wound exam from 6/27-areas of marking on right lower extremity however are decreased compared to the picture    I have personally reviewed the following:   DATA INR 3.4 Labs from this a.m. are still pending-TSH A1c INR BMP CBC   Scheduled Meds: . FLUoxetine  40 mg Oral  Daily  . insulin aspart  0-5 Units Subcutaneous QHS  . insulin aspart  0-9 Units Subcutaneous TID WC  . levothyroxine  175 mcg Oral QAC breakfast  . mometasone-formoterol  2 puff Inhalation BID  . nystatin    Topical BID  . simvastatin  20 mg Oral q1800  . sodium chloride flush  3 mL Intravenous Q12H  . Warfarin - Pharmacist Dosing Inpatient   Does not apply q1800   Continuous Infusions: . sodium chloride 100 mL/hr at 03/29/19 0600  .  ceFAZolin (ANCEF) IV 2 g (03/29/19 0138)    Active Problems:   Hypothyroidism   Diabetes type 2, controlled ([place])   Morbid obesity (HCC)   Atrial fibrillation (HCC)   OSA (obstructive sleep apnea)   Sepsis ([place])   Acute on chronic respiratory failure with hypoxia ([place])   LOS: 1 day

## 2019-03-29 NOTE — Consult Note (Signed)
[facility] Nurse wound consult note Reason for Consult: RLE cellulits; pannus, breast  Wound type: Cellulitis RLE Intertriginous dermatitis (ITD) inframammary and pannicular  Moisture associated skin damage (MASD) buttocks and posterior thighs (reported by NT that assessed with cleaning) Pressure Injury POA: NA Measurement: Weeping area on the right lateral calf 8cm x 5cm  Intact bulla right medial foot 2cm x 6cm x 0cm  Intact bulla right dorsal foot 3cm x 4cm x 0cm  Wound bed: Bulla are intact, weeping area is macerated from drainage; intense redness noted under her pannus and breast Drainage (amount, consistency, odor) serous from the RLE, yeast like thick drainage under the pannus (unclear if creams have been placed in this area) Periwound: RLE cellulitis  Dressing procedure/placement/frequency: 1. Add antimicrobial wicking fabric under the breast and pannus; making sure to clean away all creams and powder prior to application 2. Add single layer of xeroform to the weeping areas of the RLE 3. Monitor bulla, if ruptures cover with single layer of xeroform also 4. Moisture barrier cream to the buttocks and posterior thighs  Discussed POC with patient and bedside nurse.  Re consult if needed, will not follow at this time. Thanks  Astraea Gaughran [employer], RN,CWOCN, CNS, [place] [phone])
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
## 2019-03-29 NOTE — Progress Notes (Signed)
ANTICOAGULATION CONSULT NOTE - Initial Consult  Pharmacy Consult for warfarin Indication: atrial fibrillation and pulmonary embolus  Allergies  Allergen Reactions  . Pseudoephedrine Other (See Comments)    REACTION: tightness in chest  . Augmentin [Amoxicillin-Pot Clavulanate] Itching and Rash    Did it involve swelling of the face/tongue/throat, SOB, or low BP? No Did it involve sudden or severe rash/hives, skin peeling, or any reaction on the inside of your mouth or nose? Yes Did you need to seek medical attention at a hospital or doctor's office? Yes When did it last happen?2019 If all above answers are "NO", may proceed with cephalosporin use.   . Latex Itching and Rash    Patient Measurements: Height: 5\' 5"  (165.1 cm) Weight: (!) 336 lb 10.3 oz (152.7 kg) IBW/kg (Calculated) : 57  Vital Signs: Temp: 99.7 F (37.6 C) (06/28 1110) Temp Source: Axillary (06/28 1110) BP: 85/42 (06/28 1110) Pulse Rate: 111 (06/28 0321)  Labs: Recent Labs    03/28/19 1355 03/29/19 0743  HGB 10.5* 9.3*  HCT 33.2* 29.0*  PLT 194 187  LABPROT 33.7* 36.2*  INR 3.4* 3.7*  CREATININE 2.25* 1.97*    Estimated Creatinine Clearance: 44 mL/min (A) (by C-G formula based on SCr of 1.97 mg/dL (H)).   Medical History: Past Medical History:  Diagnosis Date  . Atrial fibrillation ([place])    with cardioversion success  . CHF (congestive heart failure) ([place])   . COPD (chronic obstructive pulmonary disease) ([place])   . Cor pulmonale (HCC)    and obesity hypoventilation syndrome  . Diabetes mellitus without complication ([place])   . GERD (gastroesophageal reflux disease)   . Hypothyroid   . Morbid obesity ([place])   . OA (osteoarthritis)   . Pulmonary embolism (HCC)    hx of on coumadin  . Reactive airways dysfunction syndrome (HCC)    Assessment: 9 yof presented to the ED with leg swelling. She is on chronic warfarin for history of afib and PE.  INR is still above goal at 3.7 today. Hgb  is trending down and platelets are WNL. No bleeding noted.  PTA dose 5mg  MWF, 2.5mg  all other days  Goal of Therapy:  INR 2-3 Monitor platelets by anticoagulation protocol: Yes   Plan:  No warfarin today Daily INR  Erin Hearing PharmD., BCPS Clinical Pharmacist 03/29/2019 11:36 AM

## 2019-03-29 NOTE — Progress Notes (Signed)
Patient refused the use of cpap for the evening. Will continue to monitor patient.  

## 2019-03-30 ENCOUNTER — Inpatient Hospital Stay (HOSPITAL_COMMUNITY): Payer: Medicare HMO

## 2019-03-30 LAB — COMPREHENSIVE METABOLIC PANEL
ALT: 16 U/L (ref 0–44)
AST: 54 U/L — ABNORMAL HIGH (ref 15–41)
Albumin: 1.8 g/dL — ABNORMAL LOW (ref 3.5–5.0)
Alkaline Phosphatase: 144 U/L — ABNORMAL HIGH (ref 38–126)
Anion gap: 9 (ref 5–15)
BUN: 45 mg/dL — ABNORMAL HIGH (ref 8–23)
CO2: 24 mmol/L (ref 22–32)
Calcium: 8.6 mg/dL — ABNORMAL LOW (ref 8.9–10.3)
Chloride: 100 mmol/L (ref 98–111)
Creatinine, Ser: 1.38 mg/dL — ABNORMAL HIGH (ref 0.44–1.00)
GFR calc Af Amer: 47 mL/min — ABNORMAL LOW (ref >60)
GFR calc non Af Amer: 41 mL/min — ABNORMAL LOW (ref >60)
Glucose, Bld: 111 mg/dL — ABNORMAL HIGH (ref 70–99)
Potassium: 3.4 mmol/L — ABNORMAL LOW (ref 3.5–5.1)
Sodium: 133 mmol/L — ABNORMAL LOW (ref 135–145)
Total Bilirubin: 0.8 mg/dL (ref 0.3–1.2)
Total Protein: 5.6 g/dL — ABNORMAL LOW (ref 6.5–8.1)

## 2019-03-30 LAB — PROTIME-INR
INR: 4 — ABNORMAL HIGH (ref 0.8–1.2)
Prothrombin Time: 38.7 seconds — ABNORMAL HIGH (ref 11.4–15.2)

## 2019-03-30 LAB — GLUCOSE, CAPILLARY
Glucose-Capillary: 80 mg/dL (ref 70–99)
Glucose-Capillary: 72 mg/dL (ref 70–99)
Glucose-Capillary: 92 mg/dL (ref 70–99)
Glucose-Capillary: 86 mg/dL (ref 70–99)
Glucose-Capillary: 109 mg/dL — ABNORMAL HIGH (ref 70–99)
Glucose-Capillary: 96 mg/dL (ref 70–99)

## 2019-03-30 NOTE — Plan of Care (Signed)

## 2019-03-30 NOTE — Progress Notes (Signed)
[facility] HOSPITALIST PROGRESS NOTE  Amy Bryant YBO:[id number] DOB: 05-08-1956 DOA: 03/28/2019 PCP: Abner Greenspan, MD  A/P Sepsis 2/2 cellulitis--hopefully not abcess? Pain not really improved--bleb on interior of R foot-she walks aound without shoes, has charcot foot-- R foot Xray shows no overt abcessCT foot now rule out abscess Continue cefazolin-see data Cont saline--slow rate from 100->50 cc/h Appreciate Wound nurse input 6/28 compensated chrHFpEF EF 60-65% 01/09/2019 Atrial fibrillation permanent [place] score >3/Eliquis Home dose Lasix 80 twice daily held at this time Cardizem on hold given hypotension-is in NSR now with pvc--resume Coumadin per pharmacy recheck a.m. AKI on admit Hypovolemic Hyponatremia Creat improved from admit-possibly 2/2 sepsis in setting of continued diuretics Rpt labs now and in am Cont saline as above DM TY 2 on oral meds Hypothyroidism Holding metformin 1000 twice daily at this time, holding glipizide XL SSI--72-200's Supplement synthroid 175 mcg every morning OSA on CPAP COPD Chronic respiratory failure/oxygen 4 L at home Patient has bruise to the bridge of nose secondary to chronic CPAP use- followed up outpatient with regards to change of mask Continue Symbicort 2 puff twice daily, albuterol/alternate in hospital-she has no wheeze at this time Recent symptomatic anemia hospitalization 4/9-4/15 Stable currently Monitor trends in house Acute urinary retention,reatained ? 700 cc 6/28 Unclear if secondary to infection/pain meds-placed indwelling foley Clamp catheter for sensation to void BMI 56 Life-threatening-recommend outpatient discussion regarding nutrition, feasibility GOP/gastric sleeve?  Synopsis 62 ? Afib CHad2Vasc2 score~4 /elliquis HFpEF ef 60-65% 4.10.20 COPD/Chr Resp Failure + OSA on 4lpm O2 DM ty II Bipolar BMI 55.4 hypothryoid Chr LE wounds  Last admit with UGIB Hb 5.6-FOBT was neg-int hemorrhoids noted-diverticulosis-hiatal  hernia  Came to ED with Tmax 99.4- wbc 20.8-bun/creat 56/2.2 up from baseline Sodium down 140--->127 CXR=L sided Atelectasis--Given Saline 1.8 L in ED   DVT coumadin   Code Status: full   Communication: none   Disposition Plan: Wende Bushy, MD  [facility] Via [email or web address] 7PM-7AM contact night coverage as above 03/30/2019, 12:53 PM  LOS: 2 days    Antimicrobials:  Cefazolin  Interval history/Subjective:  Pain ++ in both feet Not improvement Nursing reporting swelling --changed acc to [place] nurse No fever, no chills no cough no cold  Objective:  Vitals:  Vitals:   03/30/19 0800 03/30/19 1112  BP: 124/71 (!) 115/56  Pulse: 87 85  Resp: (!) 31 15  Temp:  (!) 97.5 F (36.4 C)  SpO2: 96%     Exam:  Mallampati 4, in some pain--not improved Cannot appreciate JVD, cannot appreciate carotids S1-S2 HSM ~ 3/6 sinus tach with PVCs Abdomen obese unable to screen for organomegaly no tenderness however See pictures for wound exam from 6/29 Her R heel is very tender--she has blebs-she is tender  With an indureted area in sole of R foot On L side she has excoriation          I have personally reviewed the following:   DATA INR 3.4    Scheduled Meds: . FLUoxetine  40 mg Oral Daily  . insulin aspart  0-5 Units Subcutaneous QHS  . insulin aspart  0-9 Units Subcutaneous TID WC  . levothyroxine  175 mcg Oral QAC breakfast  . mometasone-formoterol  2 puff Inhalation BID  . simvastatin  20 mg Oral q1800  . sodium chloride flush  3 mL Intravenous Q12H  . Warfarin - Pharmacist Dosing Inpatient   Does not apply q1800   Continuous Infusions: . sodium chloride  100 mL/hr at 03/30/19 0824  .  ceFAZolin (ANCEF) IV 2 g (03/30/19 [id number])    Active Problems:   Hypothyroidism   Diabetes type 2, controlled (HCC)   Morbid obesity (HCC)   Atrial fibrillation (HCC)   OSA (obstructive sleep apnea)   Sepsis (HCC)   Acute on chronic respiratory  failure with hypoxia (HCC)   LOS: 2 days

## 2019-03-30 NOTE — Progress Notes (Signed)
APS report successfully made.   [place], [place]

## 2019-03-30 NOTE — Plan of Care (Signed)
  Problem: Education: Goal: Knowledge of General Education information will improve Description: Including pain rating scale, medication(s)/side effects and non-pharmacologic comfort measures Outcome: Progressing   Problem: Clinical Measurements: Goal: Ability to maintain clinical measurements within normal limits will improve Outcome: Progressing Goal: Diagnostic test results will improve Outcome: Progressing Goal: Respiratory complications will improve Outcome: Progressing Goal: Cardiovascular complication will be avoided Outcome: Progressing   Problem: Nutrition: Goal: Adequate nutrition will be maintained Outcome: Progressing   Problem: Coping: Goal: Level of anxiety will decrease Outcome: Progressing   Problem: Pain Managment: Goal: General experience of comfort will improve Outcome: Progressing   Problem: Safety: Goal: Ability to remain free from injury will improve Outcome: Progressing   

## 2019-03-30 NOTE — Progress Notes (Signed)
[place] for warfarin Indication: atrial fibrillation and pulmonary embolus  Allergies  Allergen Reactions  . Pseudoephedrine Other (See Comments)    REACTION: tightness in chest  . Augmentin [Amoxicillin-Pot Clavulanate] Itching and Rash    Did it involve swelling of the face/tongue/throat, SOB, or low BP? No Did it involve sudden or severe rash/hives, skin peeling, or any reaction on the inside of your mouth or nose? Yes Did you need to seek medical attention at a hospital or doctor's office? Yes When did it last happen?2019 If all above answers are "NO", may proceed with cephalosporin use.   . Latex Itching and Rash    Patient Measurements: Height: 5\' 5"  (165.1 cm) Weight: (!) 336 lb 13.8 oz (152.8 kg) IBW/kg (Calculated) : 57  Vital Signs: Temp: 98.8 F (37.1 C) (06/29 0728) Temp Source: Oral (06/29 0728) BP: 120/61 (06/29 0728) Pulse Rate: 85 (06/29 0400)  Labs: Recent Labs    03/28/19 1355 03/29/19 0743 03/30/19 0703  HGB 10.5* 9.3*  --   HCT 33.2* 29.0*  --   PLT 194 187  --   LABPROT 33.7* 36.2* 38.7*  INR 3.4* 3.7* 4.0*  CREATININE 2.25* 1.97*  --     Estimated Creatinine Clearance: 44 mL/min (A) (by C-G formula based on SCr of 1.97 mg/dL (H)).   Medical History: Past Medical History:  Diagnosis Date  . Atrial fibrillation ([place])    with cardioversion success  . CHF (congestive heart failure) ([place])   . COPD (chronic obstructive pulmonary disease) ([place])   . Cor pulmonale (HCC)    and obesity hypoventilation syndrome  . Diabetes mellitus without complication ([place])   . GERD (gastroesophageal reflux disease)   . Hypothyroid   . Morbid obesity ([place])   . OA (osteoarthritis)   . Pulmonary embolism (HCC)    hx of on coumadin  . Reactive airways dysfunction syndrome (HCC)    Assessment: 17 yof presented to the ED with leg swelling. She is on chronic warfarin for history of afib and PE.  INR is still  trending up today at 4.0. Hgb was trending down and platelets are WNL, not checked today. No bleeding noted.  PTA dose 5mg  MWF, 2.5mg  all other days  Goal of Therapy:  INR 2-3 Monitor platelets by anticoagulation protocol: Yes   Plan:  Continue to hold warfarin today Daily INR  Erin Hearing PharmD., BCPS Clinical Pharmacist 03/30/2019 8:38 AM

## 2019-03-31 DIAGNOSIS — J9621 Acute and chronic respiratory failure with hypoxia: Secondary | ICD-10-CM

## 2019-03-31 LAB — COMPREHENSIVE METABOLIC PANEL
ALT: 12 U/L (ref 0–44)
AST: 47 U/L — ABNORMAL HIGH (ref 15–41)
Albumin: 1.7 g/dL — ABNORMAL LOW (ref 3.5–5.0)
Alkaline Phosphatase: 183 U/L — ABNORMAL HIGH (ref 38–126)
Anion gap: 7 (ref 5–15)
BUN: 39 mg/dL — ABNORMAL HIGH (ref 8–23)
CO2: 25 mmol/L (ref 22–32)
Calcium: 8.7 mg/dL — ABNORMAL LOW (ref 8.9–10.3)
Chloride: 101 mmol/L (ref 98–111)
Creatinine, Ser: 1.27 mg/dL — ABNORMAL HIGH (ref 0.44–1.00)
GFR calc Af Amer: 52 mL/min — ABNORMAL LOW (ref >60)
GFR calc non Af Amer: 45 mL/min — ABNORMAL LOW (ref >60)
Glucose, Bld: 96 mg/dL (ref 70–99)
Potassium: 3.3 mmol/L — ABNORMAL LOW (ref 3.5–5.1)
Sodium: 133 mmol/L — ABNORMAL LOW (ref 135–145)
Total Bilirubin: 1.1 mg/dL (ref 0.3–1.2)
Total Protein: 5.8 g/dL — ABNORMAL LOW (ref 6.5–8.1)

## 2019-03-31 LAB — GLUCOSE, CAPILLARY
Glucose-Capillary: 94 mg/dL (ref 70–99)
Glucose-Capillary: 83 mg/dL (ref 70–99)
Glucose-Capillary: 99 mg/dL (ref 70–99)
Glucose-Capillary: 124 mg/dL — ABNORMAL HIGH (ref 70–99)

## 2019-03-31 LAB — CBC WITH DIFFERENTIAL/PLATELET
Abs Immature Granulocytes: 0 10*3/uL (ref 0.00–0.07)
Basophils Absolute: 0 10*3/uL (ref 0.0–0.1)
Basophils Relative: 0 %
Eosinophils Absolute: 0 10*3/uL (ref 0.0–0.5)
Eosinophils Relative: 0 %
HCT: 27.8 % — ABNORMAL LOW (ref 36.0–46.0)
Hemoglobin: 8.6 g/dL — ABNORMAL LOW (ref 12.0–15.0)
Lymphocytes Relative: 3 %
Lymphs Abs: 0.6 10*3/uL — ABNORMAL LOW (ref 0.7–4.0)
MCH: 29 pg (ref 26.0–34.0)
MCHC: 30.9 g/dL (ref 30.0–36.0)
MCV: 93.6 fL (ref 80.0–100.0)
Monocytes Absolute: 0.6 10*3/uL (ref 0.1–1.0)
Monocytes Relative: 3 %
Neutro Abs: 19.6 10*3/uL — ABNORMAL HIGH (ref 1.7–7.7)
Neutrophils Relative %: 94 %
Platelets: 267 10*3/uL (ref 150–400)
RBC: 2.97 MIL/uL — ABNORMAL LOW (ref 3.87–5.11)
RDW: 18.2 % — ABNORMAL HIGH (ref 11.5–15.5)
WBC: 20.9 10*3/uL — ABNORMAL HIGH (ref 4.0–10.5)
nRBC: 0 % (ref 0.0–0.2)
nRBC: 0 /100 WBC

## 2019-03-31 LAB — URINE CULTURE: Culture: 20000 — AB

## 2019-03-31 LAB — PROTIME-INR
INR: 3.8 — ABNORMAL HIGH (ref 0.8–1.2)
Prothrombin Time: 36.7 seconds — ABNORMAL HIGH (ref 11.4–15.2)

## 2019-03-31 LAB — MAGNESIUM: Magnesium: 1.9 mg/dL (ref 1.7–2.4)

## 2019-03-31 MED ORDER — POTASSIUM CHLORIDE CRYS ER 20 MEQ PO TBCR
40.0000 meq | EXTENDED_RELEASE_TABLET | Freq: Every day | ORAL | Status: DC
  Administered 2019-03-31 – 2019-04-11 (×12): 40 meq via ORAL
  Filled 2019-03-31 (×12): qty 2

## 2019-03-31 MED ORDER — VANCOMYCIN HCL 10 G IV SOLR
1500.0000 mg | INTRAVENOUS | Status: DC
  Administered 2019-03-31 – 2019-04-02 (×3): 1500 mg via INTRAVENOUS
  Filled 2019-03-31 (×3): qty 1500

## 2019-03-31 MED ORDER — FLUCONAZOLE 200 MG PO TABS
200.0000 mg | ORAL_TABLET | Freq: Once | ORAL | Status: AC
  Administered 2019-03-31: 200 mg via ORAL
  Filled 2019-03-31: qty 1

## 2019-03-31 MED ORDER — VANCOMYCIN HCL IN DEXTROSE 1-5 GM/200ML-% IV SOLN
1000.0000 mg | Freq: Once | INTRAVENOUS | Status: DC
  Filled 2019-03-31: qty 200

## 2019-03-31 NOTE — Progress Notes (Signed)
[place] for warfarin Indication: atrial fibrillation and pulmonary embolus  Allergies  Allergen Reactions  . Pseudoephedrine Other (See Comments)    REACTION: tightness in chest  . Augmentin [Amoxicillin-Pot Clavulanate] Itching and Rash    Did it involve swelling of the face/tongue/throat, SOB, or low BP? No Did it involve sudden or severe rash/hives, skin peeling, or any reaction on the inside of your mouth or nose? Yes Did you need to seek medical attention at a hospital or doctor's office? Yes When did it last happen?2019 If all above answers are "NO", may proceed with cephalosporin use.   . Latex Itching and Rash    Patient Measurements: Height: 5\' 5"  (165.1 cm) Weight: (!) 334 lb 10.5 oz (151.8 kg) IBW/kg (Calculated) : 57  Vital Signs: Temp: 98.4 F (36.9 C) (06/30 0723) Temp Source: Oral (06/30 0723) BP: 111/70 (06/30 0723) Pulse Rate: 84 (06/30 0346)  Labs: Recent Labs    03/28/19 1355 03/29/19 0743 03/30/19 0703 03/30/19 1524 03/31/19 0246  HGB 10.5* 9.3*  --   --  8.6*  HCT 33.2* 29.0*  --   --  27.8*  PLT 194 187  --   --  267  LABPROT 33.7* 36.2* 38.7*  --  36.7*  INR 3.4* 3.7* 4.0*  --  3.8*  CREATININE 2.25* 1.97*  --  1.38* 1.27*    Estimated Creatinine Clearance: 67.9 mL/min (A) (by C-G formula based on SCr of 1.27 mg/dL (H)).   Medical History: Past Medical History:  Diagnosis Date  . Atrial fibrillation ([place])    with cardioversion success  . CHF (congestive heart failure) ([place])   . COPD (chronic obstructive pulmonary disease) ([place])   . Cor pulmonale (HCC)    and obesity hypoventilation syndrome  . Diabetes mellitus without complication ([place])   . GERD (gastroesophageal reflux disease)   . Hypothyroid   . Morbid obesity ([place])   . OA (osteoarthritis)   . Pulmonary embolism (HCC)    hx of on coumadin  . Reactive airways dysfunction syndrome (HCC)    Assessment: 71 yof presented to the ED  with leg swelling. She is on chronic warfarin for history of afib and PE. INR above goal on admit and remains supratherapeutic despite holding warfarin - 3.8 today, H/H drifting down.  PTA dose 5mg  MWF, 2.5mg  all other days  Goal of Therapy:  INR 2-3 Monitor platelets by anticoagulation protocol: Yes   Plan:  Continue to hold warfarin today Daily INR   Arrie Senate, PharmD, BCPS Clinical Pharmacist [phone] Please check AMION for all [place] numbers 03/31/2019

## 2019-03-31 NOTE — TOC Progression Note (Addendum)
Transition of Care [facility]) - Progression Note    Patient Details  Name: Amy Bryant MRN: [id number] Date of Birth: 1956/08/21  Transition of Care [facility]) CM/SW [place], [place] Phone Number: 03/31/2019, 11:46 AM  Clinical Narrative:      CSW received call from [place] [phone]. CSW advised , she will be informed when patient is discharging and if she needs to come and assess the patient to let the CSW know so visitation that arrangements can be made.   Thurmond Butts, MSW, [facility] Clinical Social Worker [phone]       Expected Discharge Plan and Services           Expected Discharge Date: 04/04/19                                     Social Determinants of Health (SDOH) Interventions    Readmission Risk Interventions No flowsheet data found.
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
## 2019-03-31 NOTE — Plan of Care (Signed)
  Problem: Education: Goal: Knowledge of General Education information will improve Description: Including pain rating scale, medication(s)/side effects and non-pharmacologic comfort measures Outcome: Progressing   Problem: Clinical Measurements: Goal: Ability to maintain clinical measurements within normal limits will improve Outcome: Progressing Goal: Diagnostic test results will improve Outcome: Progressing Goal: Respiratory complications will improve Outcome: Progressing Goal: Cardiovascular complication will be avoided Outcome: Progressing   Problem: Nutrition: Goal: Adequate nutrition will be maintained Outcome: Progressing   Problem: Coping: Goal: Level of anxiety will decrease Outcome: Progressing   

## 2019-03-31 NOTE — Progress Notes (Signed)
Pharmacy Antibiotic Note  Amy Bryant is a 63 y.o. female admitted on 03/28/2019 with cellulitis.  Pharmacy has been consulted for vancomycin dosing. Pt initially started on cefazolin but has not improved. AKI on admission noted with improvement to SCr 1.27 mg/dl today. Initial blood cultures negative, urine culture growing 20k E. Coli.   Plan: Stop Ancef and switch to vancomycin 1500mg  IV q24h - est AUC 519 Follow SCr, cultures, LOT  Height: 5\' 5"  (165.1 cm) Weight: (!) 334 lb 10.5 oz (151.8 kg) IBW/kg (Calculated) : 57  Temp (24hrs), Avg:98.3 F (36.8 C), Min:97.4 F (36.3 C), Max:99.4 F (37.4 C)  Recent Labs  Lab 03/28/19 1355 03/28/19 1823 03/29/19 0743 03/30/19 1524 03/31/19 0246  WBC 20.8*  --  19.2*  --  20.9*  CREATININE 2.25*  --  1.97* 1.38* 1.27*  LATICACIDVEN 1.5 1.5  --   --   --     Estimated Creatinine Clearance: 67.9 mL/min (A) (by C-G formula based on SCr of 1.27 mg/dL (H)).    Allergies  Allergen Reactions  . Pseudoephedrine Other (See Comments)    REACTION: tightness in chest  . Augmentin [Amoxicillin-Pot Clavulanate] Itching and Rash    Did it involve swelling of the face/tongue/throat, SOB, or low BP? No Did it involve sudden or severe rash/hives, skin peeling, or any reaction on the inside of your mouth or nose? Yes Did you need to seek medical attention at a hospital or doctor's office? Yes When did it last happen?2019 If all above answers are "NO", may proceed with cephalosporin use.   . Latex Itching and Rash    Antimicrobials this admission: Cefazolin 6/27 >> 6/30 Vancomycin 6/30 >>   Dose adjustments this admission: none  Microbiology results: 6/28 UCx: 20k E. Coli 6/27 MRSA PCR: neg 6/27 BCx: NGTD  Thank you for allowing pharmacy to be a part of this patient's care.   Arrie Senate, PharmD, BCPS Clinical Pharmacist [phone] Please check AMION for all [place] numbers 03/31/2019

## 2019-04-01 LAB — CBC WITH DIFFERENTIAL/PLATELET
Basophils Absolute: 0.1 10*3/uL (ref 0.0–0.1)
Basophils Relative: 0 %
Eosinophils Absolute: 0.2 10*3/uL (ref 0.0–0.5)
Eosinophils Relative: 1 %
HCT: 28.5 % — ABNORMAL LOW (ref 36.0–46.0)
Hemoglobin: 8.9 g/dL — ABNORMAL LOW (ref 12.0–15.0)
Lymphocytes Relative: 5 %
Lymphs Abs: 1.2 10*3/uL (ref 0.7–4.0)
MCH: 29.7 pg (ref 26.0–34.0)
MCHC: 31.2 g/dL (ref 30.0–36.0)
MCV: 95 fL (ref 80.0–100.0)
Monocytes Absolute: 0.8 10*3/uL (ref 0.1–1.0)
Monocytes Relative: 4 %
Neutro Abs: 18.4 10*3/uL — ABNORMAL HIGH (ref 1.7–7.7)
Neutrophils Relative %: 91 %
Platelets: 355 10*3/uL (ref 150–400)
RBC: 3 MIL/uL — ABNORMAL LOW (ref 3.87–5.11)
RDW: 17.5 % — ABNORMAL HIGH (ref 11.5–15.5)
WBC: 21.8 10*3/uL — ABNORMAL HIGH (ref 4.0–10.5)
nRBC: 0 % (ref 0.0–0.2)
nRBC: 0 /100 WBC

## 2019-04-01 LAB — COMPREHENSIVE METABOLIC PANEL
ALT: 10 U/L (ref 0–44)
AST: 55 U/L — ABNORMAL HIGH (ref 15–41)
Albumin: 1.7 g/dL — ABNORMAL LOW (ref 3.5–5.0)
Alkaline Phosphatase: 192 U/L — ABNORMAL HIGH (ref 38–126)
Anion gap: 10 (ref 5–15)
BUN: 27 mg/dL — ABNORMAL HIGH (ref 8–23)
CO2: 24 mmol/L (ref 22–32)
Calcium: 9.7 mg/dL (ref 8.9–10.3)
Chloride: 102 mmol/L (ref 98–111)
Creatinine, Ser: 1.02 mg/dL — ABNORMAL HIGH (ref 0.44–1.00)
GFR calc Af Amer: >60 mL/min (ref >60)
GFR calc non Af Amer: 58 mL/min — ABNORMAL LOW (ref >60)
Glucose, Bld: 103 mg/dL — ABNORMAL HIGH (ref 70–99)
Potassium: 3.4 mmol/L — ABNORMAL LOW (ref 3.5–5.1)
Sodium: 136 mmol/L (ref 135–145)
Total Bilirubin: 0.7 mg/dL (ref 0.3–1.2)
Total Protein: 5.9 g/dL — ABNORMAL LOW (ref 6.5–8.1)

## 2019-04-01 LAB — GLUCOSE, CAPILLARY
Glucose-Capillary: 93 mg/dL (ref 70–99)
Glucose-Capillary: 102 mg/dL — ABNORMAL HIGH (ref 70–99)
Glucose-Capillary: 145 mg/dL — ABNORMAL HIGH (ref 70–99)
Glucose-Capillary: 104 mg/dL — ABNORMAL HIGH (ref 70–99)

## 2019-04-01 LAB — PROTIME-INR
INR: 3.6 — ABNORMAL HIGH (ref 0.8–1.2)
Prothrombin Time: 35.5 seconds — ABNORMAL HIGH (ref 11.4–15.2)

## 2019-04-01 MED ORDER — SODIUM CHLORIDE 0.9 % IV SOLN
2.0000 g | Freq: Two times a day (BID) | INTRAVENOUS | Status: DC
  Administered 2019-04-01 – 2019-04-02 (×3): 2 g via INTRAVENOUS
  Filled 2019-04-01 (×4): qty 2

## 2019-04-01 MED ORDER — DILTIAZEM HCL 60 MG PO TABS
30.0000 mg | ORAL_TABLET | Freq: Three times a day (TID) | ORAL | Status: DC
  Administered 2019-04-01 – 2019-04-03 (×5): 30 mg via ORAL
  Filled 2019-04-01 (×5): qty 1

## 2019-04-01 MED ORDER — ALBUMIN HUMAN 25 % IV SOLN
50.0000 g | Freq: Once | INTRAVENOUS | Status: AC
  Administered 2019-04-01: 50 g via INTRAVENOUS
  Filled 2019-04-01: qty 200

## 2019-04-01 MED ORDER — METRONIDAZOLE IN NACL 5-0.79 MG/ML-% IV SOLN
500.0000 mg | Freq: Three times a day (TID) | INTRAVENOUS | Status: DC
  Administered 2019-04-01 – 2019-04-02 (×3): 500 mg via INTRAVENOUS
  Filled 2019-04-01 (×4): qty 100

## 2019-04-01 MED ORDER — SODIUM CHLORIDE 0.9 % IV SOLN
2.0000 g | Freq: Two times a day (BID) | INTRAVENOUS | Status: DC
  Filled 2019-04-01 (×2): qty 2

## 2019-04-01 MED ORDER — DILTIAZEM HCL 60 MG PO TABS
30.0000 mg | ORAL_TABLET | Freq: Two times a day (BID) | ORAL | Status: DC
  Administered 2019-04-01: 30 mg via ORAL
  Filled 2019-04-01: qty 1

## 2019-04-01 NOTE — Progress Notes (Signed)
Patient refusing CPAP.  No distress noted. 

## 2019-04-01 NOTE — Progress Notes (Signed)
[place] for warfarin Indication: atrial fibrillation and pulmonary embolus  Allergies  Allergen Reactions  . Pseudoephedrine Other (See Comments)    REACTION: tightness in chest  . Augmentin [Amoxicillin-Pot Clavulanate] Itching and Rash    Did it involve swelling of the face/tongue/throat, SOB, or low BP? No Did it involve sudden or severe rash/hives, skin peeling, or any reaction on the inside of your mouth or nose? Yes Did you need to seek medical attention at a hospital or doctor's office? Yes When did it last happen?2019 If all above answers are "NO", may proceed with cephalosporin use.   . Latex Itching and Rash    Patient Measurements: Height: 5\' 5"  (165.1 cm) Weight: (!) 337 lb 1.3 oz (152.9 kg) IBW/kg (Calculated) : 57  Vital Signs: Temp: 97.7 F (36.5 C) (07/01 0734) Temp Source: Oral (07/01 0734) BP: 128/69 (07/01 0734) Pulse Rate: 83 (07/01 0734)  Labs: Recent Labs    03/30/19 0703 03/30/19 1524 03/31/19 0246 04/01/19 0256  HGB  --   --  8.6* 8.9*  HCT  --   --  27.8* 28.5*  PLT  --   --  267 355  LABPROT 38.7*  --  36.7* 35.5*  INR 4.0*  --  3.8* 3.6*  CREATININE  --  1.38* 1.27* 1.02*    Estimated Creatinine Clearance: 85 mL/min (A) (by C-G formula based on SCr of 1.02 mg/dL (H)).   Medical History: Past Medical History:  Diagnosis Date  . Atrial fibrillation ([place])    with cardioversion success  . CHF (congestive heart failure) ([place])   . COPD (chronic obstructive pulmonary disease) ([place])   . Cor pulmonale (HCC)    and obesity hypoventilation syndrome  . Diabetes mellitus without complication ([place])   . GERD (gastroesophageal reflux disease)   . Hypothyroid   . Morbid obesity ([place])   . OA (osteoarthritis)   . Pulmonary embolism (HCC)    hx of on coumadin  . Reactive airways dysfunction syndrome (HCC)    Assessment: 14 yof presented to the ED with leg swelling. She is on chronic warfarin for  history of afib and PE. INR above goal on admit and remains supratherapeutic despite holding warfarin - 3.6 today, H/H low but remains stable this morning, will hold warfarin again tonight.  PTA dose 5mg  MWF, 2.5mg  all other days  Goal of Therapy:  INR 2-3 Monitor platelets by anticoagulation protocol: Yes   Plan:  Continue to hold warfarin today Daily INR   Arrie Senate, PharmD, BCPS Clinical Pharmacist [phone] Please check AMION for all [place] numbers 04/01/2019

## 2019-04-01 NOTE — Evaluation (Signed)
Occupational Therapy Evaluation Patient Details Name: Amy Bryant MRN: [id number] DOB: 02-May-1956 Today's Date: 04/01/2019    History of Present Illness Pt is a 63 y/o female with medical history significant of atrial fibrillation on Coumadin, obesity hypoventilation syndrome with CPAP at nighttime, chronic respiratory failure with chronic 4 L of nasal cannula use, morbid obesity, hypothyroidism, chronic pressure ulcer wounds on bilateral posterior thighs presented with sepsis, cellulitis, afib.     Clinical Impression   Patient admitted for above and limited by problem list below, including pain in R LE, impaired balance, decreased activity tolerance, generalized weakness and impaired cognition. Patient reports using RW for mobility with assist for transfers and ADLs without assist PTA, but inconsistent and unreliable history.  Patient currently requires +2 max -total assist for bed mobility, min-total assist +2 for self care, and unable to complete transfers today.  Cognitively assessed with Short Blessed Test with reveals significant impairments in memory, attention, and sequencing (scoring 13/28). She will require continued OT services while admitted and after dc at SNF level rehab in order to maximize independence in ADLs.     Follow Up Recommendations  SNF;Supervision/Assistance - 24 hour    Equipment Recommendations  Other (comment)(TBD at next venue of care)    Recommendations for Other Services       Precautions / Restrictions Precautions Precautions: Fall Restrictions Weight Bearing Restrictions: No      Mobility Bed Mobility Overal bed mobility: Needs Assistance Bed Mobility: Supine to Sit;Sit to Supine     Supine to sit: Max assist;+2 for physical assistance;HOB elevated Sit to supine: Total assist;+2 for physical assistance   General bed mobility comments: Pt with difficulty with come to EOB and to get back into bed.  Blood on sheets and changed the linens while pt  sitting up on side of bed.  Transfers Overall transfer level: Needs assistance Equipment used: Rolling walker (2 wheeled) Transfers: Sit to/from Stand;Lateral/Scoot Transfers Sit to Stand: Total assist;+2 physical assistance;From elevated surface        Lateral/Scoot Transfers: Total assist;+2 physical assistance General transfer comment: 3 attempts to stand and pt could not clear bottom off bed.  Pt then tried to scoot to left to drop arm recliner and pt unable.  limited by body habitus and pain in R LE     Balance Overall balance assessment: Needs assistance Sitting-balance support: No upper extremity supported;Feet supported Sitting balance-Leahy Scale: Fair Sitting balance - Comments: sat EOB 15 min with min guard assist.  static sitting only   Standing balance support: Bilateral upper extremity supported;During functional activity Standing balance-Leahy Scale: Zero Standing balance comment: unable to clear bottom off bed.                            ADL either performed or assessed with clinical judgement   ADL Overall ADL's : Needs assistance/impaired     Grooming: Minimal assistance;Sitting   Upper Body Bathing: Moderate assistance;Sitting   Lower Body Bathing: Total assistance;+2 for physical assistance;Bed level   Upper Body Dressing : Moderate assistance;Sitting   Lower Body Dressing: Total assistance;+2 for physical assistance;Bed level     Toilet Transfer Details (indicate cue type and reason): deferred          Functional mobility during ADLs: Maximal assistance;Total assistance;+2 for physical assistance General ADL Comments: pt limited by pain in R LE, generalized weakness, decreased activity tolerance, cognition     Vision Baseline Vision/History: Wears glasses Wears  Glasses: Reading only Patient Visual Report: No change from baseline Vision Assessment?: No apparent visual deficits     Perception     Praxis      Pertinent  Vitals/Pain Pain Assessment: Faces Faces Pain Scale: Hurts whole lot Pain Location: right LE Pain Descriptors / Indicators: Aching;Grimacing;Guarding;Discomfort Pain Intervention(s): Limited activity within patient's tolerance;Repositioned;Premedicated before session     Hand Dominance Right   Extremity/Trunk Assessment Upper Extremity Assessment Upper Extremity Assessment: Generalized weakness(grossly 3-/5 B UEs)   Lower Extremity Assessment Lower Extremity Assessment: Defer to PT evaluation RLE Deficits / Details: grossly 2/5 LLE Deficits / Details: grossly 2/5   Cervical / Trunk Assessment Cervical / Trunk Assessment: Normal   Communication Communication Communication: No difficulties   Cognition Arousal/Alertness: Awake/alert Behavior During Therapy: Flat affect Overall Cognitive Status: Impaired/Different from baseline Area of Impairment: Memory;Safety/judgement;Awareness;Problem solving;Following commands;Attention                   Current Attention Level: Sustained Memory: Decreased short-term memory Following Commands: Follows one step commands with increased time Safety/Judgement: Decreased awareness of safety;Decreased awareness of deficits Awareness: Emergent Problem Solving: Slow processing;Decreased initiation;Requires verbal cues;Difficulty sequencing;Requires tactile cues General Comments: Short blessed test completed: pt scoring 13/28 (impairment consistent with dementia) with significant errors in attention, time of day, memory and sequencing    General Comments  B LE edema, R LE wrapped with noted bleeding (RN had just changed dressings)     Exercises Exercises: General Lower Extremity General Exercises - Lower Extremity Heel Slides: AAROM;Both;5 reps;Supine   Shoulder Instructions      Home Living Family/patient expects to be discharged to:: Private residence Living Arrangements: Spouse/significant other;Children(son) Available Help at  Discharge: Family;Available 24 hours/day Type of Home: House Home Access: Ramped entrance     Home Layout: One level     Bathroom Shower/Tub: Chief Strategy OfficerTub/shower unit   Bathroom Toilet: Standard     Home Equipment: Environmental consultantWalker - 2 wheels;Wheelchair Writer- manual;Other (comment)(home O2)          Prior Functioning/Environment Level of Independence: Needs assistance  Gait / Transfers Assistance Needed: reports requires assist for transfers from spouse and son, stand pivots to wc using RW, short distance ambulation with RW  ADL's / Homemaking Assistance Needed: reports independent toileting, sponge bathing at sink, and dressing without assist    Comments: on 4L of oxgyen PTA, sleeps in recliner; HH PT comes out 2x/week   --noted poor historian though, inconsistenty history        OT Problem List: Decreased strength;Impaired balance (sitting and/or standing);Decreased activity tolerance;Decreased cognition;Decreased safety awareness;Decreased knowledge of use of DME or AE;Decreased knowledge of precautions;Cardiopulmonary status limiting activity;Obesity;Pain;Increased edema      OT Treatment/Interventions: Self-care/ADL training;Therapeutic exercise;DME and/or AE instruction;Therapeutic activities;Cognitive remediation/compensation;Patient/family education;Balance training    OT Goals(Current goals can be found in the care plan section) Acute Rehab OT Goals Patient Stated Goal: to go home OT Goal Formulation: With patient Time For Goal Achievement: 04/15/19 Potential to Achieve Goals: Fair  OT Frequency: Min 2X/week   Barriers to D/C:            Co-evaluation PT/OT/SLP Co-Evaluation/Treatment: Yes Reason for Co-Treatment: Complexity of the patient's impairments (multi-system involvement);Necessary to address cognition/behavior during functional activity;For patient/therapist safety;To address functional/ADL transfers PT goals addressed during session: Mobility/safety with mobility OT goals  addressed during session: ADL's and self-care      AM-PAC OT "6 Clicks" Daily Activity     Outcome Measure Help from another person eating meals?: A  Little Help from another person taking care of personal grooming?: A Little Help from another person toileting, which includes using toliet, bedpan, or urinal?: Total Help from another person bathing (including washing, rinsing, drying)?: A Lot Help from another person to put on and taking off regular upper body clothing?: A Lot Help from another person to put on and taking off regular lower body clothing?: Total 6 Click Score: 12   End of Session Equipment Utilized During Treatment: Gait belt;Rolling walker;Oxygen Nurse Communication: Mobility status;Need for lift equipment  Activity Tolerance: Patient limited by pain Patient left: in bed;with call bell/phone within reach;with nursing/sitter in room;with bed alarm set  OT Visit Diagnosis: Other abnormalities of gait and mobility (R26.89);Muscle weakness (generalized) (M62.81);Other symptoms and signs involving cognitive function;Pain Pain - Right/Left: Right Pain - part of body: Leg                Time: 1024-1105 OT Time Calculation (min): 41 min Charges:  OT General Charges $OT Visit: 1 Visit OT Evaluation $OT Eval Moderate Complexity: [place], OT Acute Rehabilitation Services Pager [phone] Office [phone]   Delight Stare 04/01/2019, 12:32 PM

## 2019-04-01 NOTE — Evaluation (Signed)
Physical Therapy Evaluation Patient Details Name: Amy ShellingDeborah G Armetta MRN: [id number] DOB: [birth year] Today's Date: 04/01/2019   History of Present Illness  Pt is a 63 y/o female with medical history significant of atrial fibrillation on Coumadin, obesity hypoventilation syndrome with CPAP at nighttime, chronic respiratory failure with chronic 4 L of nasal cannula use, morbid obesity, hypothyroidism, chronic pressure ulcer wounds on bilateral posterior thighs presented with sepsis, cellulitis, afib.    Clinical Impression  Pt admitted with above diagnosis. Pt currently with functional limitations due to the deficits listed below (see PT Problem List). Pt was able to sit EOB with min guard assist for15 min.  Pt needs+2 max to total assist for mobility currently. Will need SNF.  Pt very deconditioned and weak.    Pt will benefit from skilled PT to increase their independence and safety with mobility to allow discharge to the venue listed below.    Follow Up Recommendations SNF;Supervision/Assistance - 24 hour    Equipment Recommendations  Other (comment)(TBA)    Recommendations for Other Services       Precautions / Restrictions Precautions Precautions: Fall Restrictions Weight Bearing Restrictions: No      Mobility  Bed Mobility Overal bed mobility: Needs Assistance Bed Mobility: Supine to Sit;Sit to Supine     Supine to sit: Max assist;+2 for physical assistance;HOB elevated Sit to supine: Total assist;+2 for physical assistance   General bed mobility comments: Pt with difficulty with come to EOB and to get back into bed.  Blood on sheets and changed the linens while pt sitting up on side of bed.  Transfers Overall transfer level: Needs assistance Equipment used: Rolling walker (2 wheeled) Transfers: Sit to/from Stand;Lateral/Scoot Transfers Sit to Stand: Total assist;+2 physical assistance;From elevated surface        Lateral/Scoot Transfers: Total assist;+2 physical  assistance General transfer comment: 3 attempts to stand and pt could not clear bottom off bed.  Pt then tried to scoot to left to drop arm recliner and pt unable.  Will continue to follow.    Ambulation/Gait                Stairs            Wheelchair Mobility    Modified Rankin (Stroke Patients Only)       Balance Overall balance assessment: Needs assistance Sitting-balance support: No upper extremity supported;Feet supported Sitting balance-Leahy Scale: Fair Sitting balance - Comments: sat EOB 15 min with min guard assist.  ABle to move UEs some.     Standing balance support: Bilateral upper extremity supported;During functional activity Standing balance-Leahy Scale: Zero Standing balance comment: unable to clear bottom off bed.                              Pertinent Vitals/Pain Pain Assessment: Faces Faces Pain Scale: Hurts whole lot Pain Location: right LE Pain Descriptors / Indicators: Aching;Grimacing;Guarding;Discomfort Pain Intervention(s): Limited activity within patient's tolerance;Monitored during session;Repositioned    Home Living Family/patient expects to be discharged to:: Private residence Living Arrangements: Spouse/significant other;Children(son ) Available Help at Discharge: Family;Available 24 hours/day Type of Home: House Home Access: Ramped entrance     Home Layout: One level Home Equipment: Walker - 2 wheels;Wheelchair - Careers advisermanual;Other (comment)(has home O2 )      Prior Function Level of Independence: Needs assistance   Gait / Transfers Assistance Needed: reports requires assist for transfers from spouse and son, stand pivots to wc  using RW, short distance ambulation with RW   ADL's / Homemaking Assistance Needed: reports independent toileting, sponge bathing at sink, and dressing without assist   Comments: on 4L of oxgyen PTA, sleeps in recliner; [facility] PT comes out 2x/week        Hand Dominance   Dominant Hand:  Right    Extremity/Trunk Assessment   Upper Extremity Assessment Upper Extremity Assessment: Defer to OT evaluation    Lower Extremity Assessment Lower Extremity Assessment: RLE deficits/detail;LLE deficits/detail RLE Deficits / Details: grossly 2/5 LLE Deficits / Details: grossly 2/5    Cervical / Trunk Assessment Cervical / Trunk Assessment: Normal  Communication   Communication: No difficulties  Cognition Arousal/Alertness: Awake/alert Behavior During Therapy: Flat affect Overall Cognitive Status: Impaired/Different from baseline Area of Impairment: Memory;Safety/judgement;Awareness;Problem solving                     Memory: Decreased short-term memory   Safety/Judgement: Decreased awareness of safety;Decreased awareness of deficits   Problem Solving: Slow processing;Decreased initiation;Requires verbal cues;Difficulty sequencing;Requires tactile cues General Comments: See OT note regarding cognitive screen      General Comments General comments (skin integrity, edema, etc.): Pt with edema bil LEs.  Pt also with bleeding noted in right LE.  Nurse had just changed dressings.     Exercises General Exercises - Lower Extremity Heel Slides: AAROM;Both;5 reps;Supine   Assessment/Plan    PT Assessment Patient needs continued PT services  PT Problem List Decreased activity tolerance;Decreased balance;Decreased cognition;Decreased mobility;Decreased knowledge of use of DME;Decreased safety awareness;Decreased knowledge of precautions;Pain;Decreased skin integrity;Decreased strength;Decreased range of motion       PT Treatment Interventions DME instruction;Gait training;Functional mobility training;Therapeutic activities;Therapeutic exercise;Balance training;Patient/family education;Cognitive remediation    PT Goals (Current goals can be found in the Care Plan section)  Acute Rehab PT Goals Patient Stated Goal: to go home PT Goal Formulation: With patient Time  For Goal Achievement: 04/15/19 Potential to Achieve Goals: Good    Frequency Min 2X/week   Barriers to discharge        Co-evaluation PT/OT/SLP Co-Evaluation/Treatment: Yes Reason for Co-Treatment: Complexity of the patient's impairments (multi-system involvement);For patient/therapist safety;Necessary to address cognition/behavior during functional activity PT goals addressed during session: Mobility/safety with mobility         AM-PAC PT "6 Clicks" Mobility  Outcome Measure Help needed turning from your back to your side while in a flat bed without using bedrails?: Total Help needed moving from lying on your back to sitting on the side of a flat bed without using bedrails?: Total Help needed moving to and from a bed to a chair (including a wheelchair)?: Total Help needed standing up from a chair using your arms (e.g., wheelchair or bedside chair)?: Total Help needed to walk in hospital room?: Total Help needed climbing 3-5 steps with a railing? : Total 6 Click Score: 6    End of Session Equipment Utilized During Treatment: Gait belt;Oxygen Activity Tolerance: Patient limited by fatigue;Patient limited by pain Patient left: in bed;with call bell/phone within reach;with bed alarm set Nurse Communication: Mobility status;Need for lift equipment PT Visit Diagnosis: Muscle weakness (generalized) (M62.81);Pain Pain - Right/Left: Right Pain - part of body: Leg    Time: 1023-1106 PT Time Calculation (min) (ACUTE ONLY): 43 min   Charges:   PT Evaluation $PT Eval Moderate Complexity: 1 Mod PT Treatments $Therapeutic Activity: 8-22 mins        [place] Pager:  [phone]  Office:  [phone]  Amadeo GarnetDawn F Ebubechukwu Jedlicka 04/01/2019, 11:41 AM

## 2019-04-01 NOTE — Consult Note (Signed)
[place] Nurse wound follow up Reconsulted to consider compression.  We have no vascular studies and patient with chronic respiratory failure on 4L oxygen at home, but will add orders for modified light compression.  Wound type: cellulitis  Measurement:Right posterior leg:  10 cm x 5 cm x 0.1 cm ruptured bulla Right dorsal foot:  8 cm x 6 cm intact serum filled blister Right great toe:  3 cm x 4 cm x 0.1 cm ruptued bulla Right medial foot:  11 cm x 14 cm ruptured bulla Wound ZJI:RCVE and moist Drainage (amount, consistency, odor) moderate oozing and weeping  No odor Periwound:edema to lower legs  Chronic respiratory failure Dressing procedure/placement/frequency:Cleanse bilateral legs and feet with soap and water  Apply Xeroform to bullae on legs and feet/toes.  Wrap legs with kerlix and secure with ace wraps.  Change M/W/F or when visibly soiled.  Will not follow at this time.  Please re-consult if needed.  Domenic Moras MSN, RN, FNP-BC CWON Wound, Ostomy, Continence Nurse Pager [phone]

## 2019-04-01 NOTE — Care Management Important Message (Signed)
Important Message  Patient Details  Name: Amy Bryant MRN: [id number] Date of Birth: May 18, 1956   Medicare Important Message Given:  Yes     Memory Argue 04/01/2019, 3:48 PM

## 2019-04-01 NOTE — Plan of Care (Signed)

## 2019-04-01 NOTE — Progress Notes (Signed)
Measurements of wound Rt lateral calf wound has extended, blister not intact open oozing serosanguineous drainage 10cm x 5cm. Top of rt foot blister intact 8cm x6cm Big toe MD lance blister, measurements 3cm x 4cm Rt medial and extends to rt dorsal foot measures 11.5cm by 14cm. Blister on Rt dorsal lanced by MD.

## 2019-04-01 NOTE — Progress Notes (Signed)
[facility] PROGRESS NOTE  Amy Bryant QIW:[id number] DOB: [birth year] DOA: 03/28/2019 PCP: Abner Greenspan, MD  A/P Sepsis 2/2 cellulitis-- not abcess Pain not really improved--bleb on interior of R foot-she walks aound without shoes, has charcot foot--imaging not suggestive of abscess Antibiotics started on admission-added Flagyl, cefepime 7/1 Cont saline 50 cc/h We will give albumin 50 today IV Appreciate Wound nurse input 6/28--I lanced the blebs on her lower extremities 7/1--sterile fluid on visual exam compensated chrHFpEF EF 60-65% 01/09/2019 Atrial fibrillation permanent [place] score >3/coumadin Home dose Lasix 80 twice daily held at this time Resume Cardizem low-dose 30 mg tid daily given trigeminy on monitors Pvc--Get am mag --Coumadin per pharmacy-on hold at this time secondary to high INR AKI on admit secondary to sepsis, Lasix etc. Hypovolemic Hyponatremia  Mild hypokalemia Peak creatinine 2.25-->1.27-->and continues to improve Replacing potassium DM TY 2 on oral meds Hypothyroidism Holding metformin 1000 twice daily at this time, holding glipizide XL SSI--80s to 120s Supplement synthroid 175 mcg every morning OSA on CPAP COPD Chronic respiratory failure/oxygen 4 L at home Patient has bruise to the bridge of nose secondary to chronic CPAP use- Asking respiratory therapist to get nasal pillows see if we can give her needed treatments Continue Symbicort 2 puff twice daily, albuterol/alternate in hospital-she has no wheeze at this time Recent symptomatic anemia hospitalization 4/9-4/15 Stable currently Monitor trends in house Acute urinary retention,reatained ? 700 cc 6/28 Unclear if secondary to infection/pain meds-keep foley--clamp in several days and recheck BMI 56 Life-threatening-recommend outpatient discussion regarding nutrition, feasibility GOP/gastric sleeve?  Synopsis 62 ? Afib CHad2Vasc2 score~4 /elliquis HFpEF ef 60-65% 4.10.20 COPD/Chr Resp Failure  + OSA on 4lpm O2 DM ty II Bipolar BMI 55.4 hypothryoid Chr LE wounds  Last admit with UGIB Hb 5.6-FOBT was neg-int hemorrhoids noted-diverticulosis-hiatal hernia  Came to ED with Tmax 99.4-she wbc 20.8-bun/creat 56/2.2 up from baseline Sodium down 140--->127 CXR=L sided Atelectasis--Given Saline 1.8 L in ED   DVT coumadin   Code Status: full   Communication: fully updated husband 6/30 Disposition Plan: Unclear-needs several more days of antibiotics and improvement prior to consideration of transition to oral meds-had to escalate antibiotics 7/1   Amy Arambula, MD  [facility] Via amion app OR [email or web address] 7PM-7AM contact night coverage as above 04/01/2019, 2:06 PM  LOS: 4 days    Antimicrobials:  Cefazolin  Interval history/Subjective:  Continues to feel better Note cognitive issues however when PT was seeing patient She can tell me where she has-pain is severe and posterior lateral calves both lower extremities with excoriated areas of skin from the blebs I lanced a couple of the blebs this morning with sterile technique She is afebrile she is eating somewhat she has no chest pain her blood pressures are somewhat low  Objective:  Vitals:  Vitals:   04/01/19 0746 04/01/19 1106  BP:  122/63  Pulse:  86  Resp:  14  Temp:  97.7 F (36.5 C)  SpO2: 97% 93%    Exam:  Mallampati 4, bridge on nose is ok-thick neck Cannot appreciate JVD, cannot appreciate carotids S1-S2 HSM ~ 3/6 sinus tach bigeminy and trigeminy throwing frequently Abdomen obese unable to screen for organomegaly no tenderness however Excoriation on posterior aspect of right thigh slightly painful erythema seems to spread to some degree   I have personally reviewed the following:   DATA K3.9-->3.3-->3.4 BUNs/creatinine 56/2.2 on admission-->39/1.2-->27/1.0 Albumin 1.7 Total protein 5.8 Hemoglobin down from 10.5-8.6-->8.9   Scheduled Meds: . diltiazem  30 mg  Oral Q8H  . FLUoxetine  40  mg Oral Daily  . insulin aspart  0-5 Units Subcutaneous QHS  . insulin aspart  0-9 Units Subcutaneous TID WC  . levothyroxine  175 mcg Oral QAC breakfast  . mometasone-formoterol  2 puff Inhalation BID  . potassium chloride  40 mEq Oral Daily  . simvastatin  20 mg Oral q1800  . sodium chloride flush  3 mL Intravenous Q12H  . Warfarin - Pharmacist Dosing Inpatient   Does not apply q1800   Continuous Infusions: . sodium chloride 50 mL/hr (03/31/19 1118)  . ceFEPime (MAXIPIME) IV 2 g (04/01/19 1035)  . metronidazole 500 mg (04/01/19 1115)  . vancomycin 1,500 mg (04/01/19 0748)    Active Problems:   Hypothyroidism   Diabetes type 2, controlled (HCC)   Morbid obesity (HCC)   Atrial fibrillation (HCC)   OSA (obstructive sleep apnea)   Sepsis (HCC)   Acute on chronic respiratory failure with hypoxia (HCC)   LOS: 4 days

## 2019-04-01 NOTE — Progress Notes (Signed)
Pharmacy Antibiotic Note  Amy Bryant is a 63 y.o. female admitted on 03/28/2019 with cellulitis and R foot wound. Pharmacy has been consulted for vancomycin dosing. Pt initially started on cefazolin but has not improved, switched to vancomycin yesterday. MD requesting broader coverage today - adding cefepime and metronidazole. CT negative for abscess.  Plan: -Continue vancomycin 1500mg  IV q24h -Add cefepime 2g IV q12h -Add metronidazole 500mg  IV q8h   Height: 5\' 5"  (165.1 cm) Weight: (!) 337 lb 1.3 oz (152.9 kg) IBW/kg (Calculated) : 57  Temp (24hrs), Avg:98.6 F (37 C), Min:97.7 F (36.5 C), Max:99.1 F (37.3 C)  Recent Labs  Lab 03/28/19 1355 03/28/19 1823 03/29/19 0743 03/30/19 1524 03/31/19 0246 04/01/19 0256  WBC 20.8*  --  19.2*  --  20.9* 21.8*  CREATININE 2.25*  --  1.97* 1.38* 1.27* 1.02*  LATICACIDVEN 1.5 1.5  --   --   --   --     Estimated Creatinine Clearance: 85 mL/min (A) (by C-G formula based on SCr of 1.02 mg/dL (H)).    Allergies  Allergen Reactions  . Pseudoephedrine Other (See Comments)    REACTION: tightness in chest  . Augmentin [Amoxicillin-Pot Clavulanate] Itching and Rash    Did it involve swelling of the face/tongue/throat, SOB, or low BP? No Did it involve sudden or severe rash/hives, skin peeling, or any reaction on the inside of your mouth or nose? Yes Did you need to seek medical attention at a hospital or doctor's office? Yes When did it last happen?2019 If all above answers are "NO", may proceed with cephalosporin use.   . Latex Itching and Rash    Antimicrobials this admission: Cefazolin 6/27 >> 6/30 Vancomycin 6/30 >>  Metronidazole 7/1 >> Cefepime 7/1 >>  Dose adjustments this admission: none  Microbiology results: 6/28 UCx: 20k E. Coli 6/27 MRSA PCR: neg 6/27 BCx: NGTD  Thank you for allowing pharmacy to be a part of this patient's care.   Arrie Senate, PharmD, BCPS Clinical Pharmacist [phone] Please  check AMION for all [place] numbers 04/01/2019

## 2019-04-02 ENCOUNTER — Inpatient Hospital Stay (HOSPITAL_COMMUNITY): Payer: Medicare HMO

## 2019-04-02 DIAGNOSIS — E119 Type 2 diabetes mellitus without complications: Secondary | ICD-10-CM

## 2019-04-02 DIAGNOSIS — L03115 Cellulitis of right lower limb: Secondary | ICD-10-CM

## 2019-04-02 DIAGNOSIS — G4733 Obstructive sleep apnea (adult) (pediatric): Secondary | ICD-10-CM

## 2019-04-02 DIAGNOSIS — Z888 Allergy status to other drugs, medicaments and biological substances status: Secondary | ICD-10-CM

## 2019-04-02 DIAGNOSIS — Z91040 Latex allergy status: Secondary | ICD-10-CM

## 2019-04-02 DIAGNOSIS — Z881 Allergy status to other antibiotic agents status: Secondary | ICD-10-CM

## 2019-04-02 DIAGNOSIS — Z87891 Personal history of nicotine dependence: Secondary | ICD-10-CM

## 2019-04-02 LAB — COMPREHENSIVE METABOLIC PANEL
ALT: 15 U/L (ref 0–44)
ALT: 17 U/L (ref 0–44)
AST: 71 U/L — ABNORMAL HIGH (ref 15–41)
AST: 66 U/L — ABNORMAL HIGH (ref 15–41)
Albumin: 2 g/dL — ABNORMAL LOW (ref 3.5–5.0)
Albumin: 1.8 g/dL — ABNORMAL LOW (ref 3.5–5.0)
Alkaline Phosphatase: 166 U/L — ABNORMAL HIGH (ref 38–126)
Alkaline Phosphatase: 177 U/L — ABNORMAL HIGH (ref 38–126)
Anion gap: 7 (ref 5–15)
Anion gap: 7 (ref 5–15)
BUN: 17 mg/dL (ref 8–23)
BUN: 16 mg/dL (ref 8–23)
CO2: 23 mmol/L (ref 22–32)
CO2: 24 mmol/L (ref 22–32)
Calcium: 10.1 mg/dL (ref 8.9–10.3)
Calcium: 10.5 mg/dL — ABNORMAL HIGH (ref 8.9–10.3)
Chloride: 105 mmol/L (ref 98–111)
Chloride: 105 mmol/L (ref 98–111)
Creatinine, Ser: 0.82 mg/dL (ref 0.44–1.00)
Creatinine, Ser: 0.78 mg/dL (ref 0.44–1.00)
GFR calc Af Amer: >60 mL/min (ref >60)
GFR calc Af Amer: >60 mL/min (ref >60)
GFR calc non Af Amer: >60 mL/min (ref >60)
GFR calc non Af Amer: >60 mL/min (ref >60)
Glucose, Bld: 107 mg/dL — ABNORMAL HIGH (ref 70–99)
Glucose, Bld: 111 mg/dL — ABNORMAL HIGH (ref 70–99)
Potassium: 3.5 mmol/L (ref 3.5–5.1)
Potassium: 4 mmol/L (ref 3.5–5.1)
Sodium: 135 mmol/L (ref 135–145)
Sodium: 136 mmol/L (ref 135–145)
Total Bilirubin: 1.2 mg/dL (ref 0.3–1.2)
Total Bilirubin: 1 mg/dL (ref 0.3–1.2)
Total Protein: 5.6 g/dL — ABNORMAL LOW (ref 6.5–8.1)
Total Protein: 5.9 g/dL — ABNORMAL LOW (ref 6.5–8.1)

## 2019-04-02 LAB — CBC WITH DIFFERENTIAL/PLATELET
Abs Immature Granulocytes: 1 10*3/uL — ABNORMAL HIGH (ref 0.00–0.07)
Abs Immature Granulocytes: 0 10*3/uL (ref 0.00–0.07)
Band Neutrophils: 4 %
Basophils Absolute: 0 10*3/uL (ref 0.0–0.1)
Basophils Absolute: 0 10*3/uL (ref 0.0–0.1)
Basophils Relative: 0 %
Basophils Relative: 0 %
Eosinophils Absolute: 0 10*3/uL (ref 0.0–0.5)
Eosinophils Absolute: 0.4 10*3/uL (ref 0.0–0.5)
Eosinophils Relative: 0 %
Eosinophils Relative: 2 %
HCT: 27.4 % — ABNORMAL LOW (ref 36.0–46.0)
HCT: 28.5 % — ABNORMAL LOW (ref 36.0–46.0)
Hemoglobin: 8.4 g/dL — ABNORMAL LOW (ref 12.0–15.0)
Hemoglobin: 8.6 g/dL — ABNORMAL LOW (ref 12.0–15.0)
Lymphocytes Relative: 2 %
Lymphocytes Relative: 7 %
Lymphs Abs: 0.4 10*3/uL — ABNORMAL LOW (ref 0.7–4.0)
Lymphs Abs: 1.4 10*3/uL (ref 0.7–4.0)
MCH: 29.6 pg (ref 26.0–34.0)
MCH: 29.6 pg (ref 26.0–34.0)
MCHC: 30.7 g/dL (ref 30.0–36.0)
MCHC: 30.2 g/dL (ref 30.0–36.0)
MCV: 96.5 fL (ref 80.0–100.0)
MCV: 97.9 fL (ref 80.0–100.0)
Metamyelocytes Relative: 3 %
Monocytes Absolute: 0.4 10*3/uL (ref 0.1–1.0)
Monocytes Absolute: 0.4 10*3/uL (ref 0.1–1.0)
Monocytes Relative: 2 %
Monocytes Relative: 2 %
Myelocytes: 2 %
Neutro Abs: 17.7 10*3/uL — ABNORMAL HIGH (ref 1.7–7.7)
Neutro Abs: 17.9 10*3/uL — ABNORMAL HIGH (ref 1.7–7.7)
Neutrophils Relative %: 87 %
Neutrophils Relative %: 89 %
Platelets: 374 10*3/uL (ref 150–400)
Platelets: 427 10*3/uL — ABNORMAL HIGH (ref 150–400)
RBC: 2.84 MIL/uL — ABNORMAL LOW (ref 3.87–5.11)
RBC: 2.91 MIL/uL — ABNORMAL LOW (ref 3.87–5.11)
RDW: 16.8 % — ABNORMAL HIGH (ref 11.5–15.5)
RDW: 16.8 % — ABNORMAL HIGH (ref 11.5–15.5)
WBC: 19.5 10*3/uL — ABNORMAL HIGH (ref 4.0–10.5)
WBC: 20.1 10*3/uL — ABNORMAL HIGH (ref 4.0–10.5)
nRBC: 0.1 % (ref 0.0–0.2)
nRBC: 1 /100 WBC — ABNORMAL HIGH
nRBC: 0 /100 WBC
nRBC: 0.1 % (ref 0.0–0.2)

## 2019-04-02 LAB — URINALYSIS, ROUTINE W REFLEX MICROSCOPIC
Bilirubin Urine: NEGATIVE
Glucose, UA: NEGATIVE mg/dL
Hgb urine dipstick: NEGATIVE
Ketones, ur: NEGATIVE mg/dL
Nitrite: NEGATIVE
Protein, ur: 30 mg/dL — AB
Specific Gravity, Urine: 1.012 (ref 1.005–1.030)
pH: 6 (ref 5.0–8.0)

## 2019-04-02 LAB — CULTURE, BLOOD (ROUTINE X 2)
Culture: NO GROWTH
Culture: NO GROWTH
Special Requests: ADEQUATE

## 2019-04-02 LAB — GLUCOSE, CAPILLARY
Glucose-Capillary: 86 mg/dL (ref 70–99)
Glucose-Capillary: 104 mg/dL — ABNORMAL HIGH (ref 70–99)
Glucose-Capillary: 102 mg/dL — ABNORMAL HIGH (ref 70–99)
Glucose-Capillary: 141 mg/dL — ABNORMAL HIGH (ref 70–99)

## 2019-04-02 LAB — PROTIME-INR
INR: 3.6 — ABNORMAL HIGH (ref 0.8–1.2)
Prothrombin Time: 35.1 seconds — ABNORMAL HIGH (ref 11.4–15.2)

## 2019-04-02 LAB — MAGNESIUM: Magnesium: 1.8 mg/dL (ref 1.7–2.4)

## 2019-04-02 LAB — AMMONIA: Ammonia: 35 umol/L (ref 9–35)

## 2019-04-02 LAB — TSH: TSH: 4.914 u[IU]/mL — ABNORMAL HIGH (ref 0.350–4.500)

## 2019-04-02 MED ORDER — DOXYCYCLINE HYCLATE 100 MG PO TABS: 100.0000 mg | ORAL_TABLET | Freq: Two times a day (BID) | ORAL | Status: DC

## 2019-04-02 MED ORDER — DOXYCYCLINE HYCLATE 100 MG PO TABS
100.0000 mg | ORAL_TABLET | Freq: Two times a day (BID) | ORAL | Status: DC
  Administered 2019-04-02: 100 mg via ORAL
  Filled 2019-04-02: qty 1

## 2019-04-02 MED ORDER — CEPHALEXIN 500 MG PO CAPS: 500.0000 mg | ORAL_CAPSULE | Freq: Three times a day (TID) | ORAL | Status: DC

## 2019-04-02 MED ORDER — SODIUM CHLORIDE 0.9 % IV SOLN
3.0000 g | Freq: Four times a day (QID) | INTRAVENOUS | Status: DC
  Administered 2019-04-02 – 2019-04-06 (×16): 3 g via INTRAVENOUS
  Filled 2019-04-02 (×2): qty 3
  Filled 2019-04-02: qty 8
  Filled 2019-04-02: qty 3
  Filled 2019-04-02 (×2): qty 8
  Filled 2019-04-02 (×2): qty 3
  Filled 2019-04-02: qty 8
  Filled 2019-04-02 (×2): qty 3
  Filled 2019-04-02: qty 8
  Filled 2019-04-02 (×4): qty 3
  Filled 2019-04-02: qty 8
  Filled 2019-04-02 (×2): qty 3
  Filled 2019-04-02: qty 8

## 2019-04-02 MED ORDER — CEPHALEXIN 250 MG PO CAPS
250.0000 mg | ORAL_CAPSULE | Freq: Three times a day (TID) | ORAL | Status: DC
  Administered 2019-04-02 – 2019-04-03 (×3): 250 mg via ORAL
  Filled 2019-04-02 (×4): qty 1

## 2019-04-02 MED ORDER — FUROSEMIDE 10 MG/ML IJ SOLN
60.0000 mg | Freq: Once | INTRAMUSCULAR | Status: AC
  Administered 2019-04-02: 60 mg via INTRAVENOUS
  Filled 2019-04-02: qty 6

## 2019-04-02 NOTE — Plan of Care (Signed)

## 2019-04-02 NOTE — Progress Notes (Signed)
[facility] PROGRESS NOTE  Amy Bryant UMP:[id number] DOB: [birth year] DOA: 03/28/2019 PCP: Abner Greenspan, MD  A/P Sepsis 2/2 cellulitis-- not abcess Pain Better, appetite is better--labs over feet-no abscess on imaging ID recommends oral antibiotics and transition off of IV from bank cefepime Flagyl-->Doxy, Keflex Stopping IV fluids in a.m. Appreciate Wound nurse input 6/28--I lanced the blebs on her lower extremities 7/1--sterile fluid on visual exam compensated chrHFpEF EF 60-65% 01/09/2019 Atrial fibrillation permanent [place] score >3/coumadin Home dose Lasix 80 twice daily held at this time Resume Cardizem low-dose 30 mg tid daily given trigeminy on monitors Pvc--magnesium okay --Coumadin per pharmacy-on hold secondary to malnutrition AKI on admit secondary to sepsis, Lasix etc. Hypovolemic Hyponatremia from infection Mild hypokalemia Peak creatinine 2.25-->1.27--> 17/0.8-potassium good today DM TY 2 on oral meds Hypothyroidism Holding metformin 1000 twice daily at this time, holding glipizide XL SSI--86-1 04 Supplement synthroid 175 mcg every morning OSA on CPAP COPD Chronic respiratory failure/oxygen 4 L at home Patient has bruise to the bridge of nose secondary to chronic CPAP use- Asking respiratory therapist to get nasal pillows see if we can give her needed treatments Continue Symbicort 2 puff twice daily, albuterol/alternate in hospital-she has no wheeze at this time Recent symptomatic anemia hospitalization 4/9-4/15 Stable currently Monitor trends in house Acute urinary retention,reatained ? 700 cc 6/28 Unclear if secondary to infection/pain meds-keep foley--clamp in several days and recheck BMI 56 Life-threatening-recommend outpatient discussion regarding nutrition, feasibility GOP/gastric sleeve?  Synopsis 62 ? Afib CHad2Vasc2 score~4 /elliquis HFpEF ef 60-65% 4.10.20 COPD/Chr Resp Failure + OSA on 4lpm O2 DM ty II Bipolar BMI 55.4 hypothryoid Chr LE  wounds  Last admit with UGIB Hb 5.6-FOBT was neg-int hemorrhoids noted-diverticulosis-hiatal hernia  Came to ED with Tmax 99.4-she wbc 20.8-bun/creat 56/2.2 up from baseline Sodium down 140--->127 CXR=L sided Atelectasis--Given Saline 1.8 L in ED   DVT coumadin   Code Status: full   Communication: fully updated husband Disposition Plan: social work to look into APS case she will need skilled facility per PT OT this will probably happen within the next several days   Verlon Au, MD  [facility] Via [employer] app OR [email or web address] 7PM-7AM contact night coverage as above 04/02/2019, 10:28 AM  LOS: 5 days    Antimicrobials:  Cefazolin + Vanco + Flagyl-->Doxy and Keflex  Interval history/Subjective:  Pain is severe in lower extremities She has new blebs Erythema however is improved She did receive Diflucan 2 days ago and now repeated dose She needs to turn every 2 hours Her appetite is improved from prior Overall she feels somewhat better but the pain is pretty severe  Objective:  Vitals:  Vitals:   04/02/19 0756 04/02/19 0832  BP: 122/68 (!) 117/47  Pulse: 85 84  Resp: 12 14  Temp: 97.7 F (36.5 C)   SpO2: 94% 92%    Exam:  Mallampati 4, bridge on nose is ok-thick neck Cannot appreciate JVD, cannot appreciate carotids S1-S2 HSM ~ 3/6 sinus tach bigeminy and trigeminy throwing frequently Abdomen obese unable to screen for organomegaly no tenderness however MASD lower back   I have personally reviewed the following:   DATA K3.9-->3.3-->3.4->3.5 BUNs/creatinine 56/2.2 on admission-->39/1.2-->27/1.0-->17/0.8 Albumin 1.7 Total protein 5.8 Hemoglobin down from 10.5-8.6-->8.9-->8.4 WBC 21.8-->19   Scheduled Meds: . diltiazem  30 mg Oral Q8H  . FLUoxetine  40 mg Oral Daily  . insulin aspart  0-5 Units Subcutaneous QHS  . insulin aspart  0-9 Units Subcutaneous TID WC  . levothyroxine  175 mcg  Oral QAC breakfast  . mometasone-formoterol  2 puff Inhalation  BID  . potassium chloride  40 mEq Oral Daily  . simvastatin  20 mg Oral q1800  . sodium chloride flush  3 mL Intravenous Q12H  . Warfarin - Pharmacist Dosing Inpatient   Does not apply q1800   Continuous Infusions: . sodium chloride 50 mL/hr at 04/01/19 1733  . ceFEPime (MAXIPIME) IV 2 g (04/02/19 1000)  . metronidazole 500 mg (04/02/19 0625)  . vancomycin 1,500 mg (04/02/19 0741)    Principal Problem:   Cellulitis of right leg Active Problems:   Hypothyroidism   Diabetes type 2, controlled (HCC)   Morbid obesity (HCC)   Atrial fibrillation (HCC)   OSA (obstructive sleep apnea)   Acute on chronic respiratory failure with hypoxia (HCC)   LOS: 5 days

## 2019-04-02 NOTE — Consult Note (Signed)
[facility]    Date of Admission:  03/28/2019   Total days of antibiotics 6        Day 3 vancomycin        Day 3 cefepime        Day 3 metronidazole       Reason for Consult: Evolving right leg cellulitis    Referring Provider: Dr. Pleas KochJai Samtani  Assessment: She is exhibiting the natural evolution of severe cellulitis that has been treated appropriately.  Frequently using analogy of cellulitis being like a bad thermal burn.  The causative agent of cellulitis goes away but the skin damage worsens for a period of time before slowly healing with desquamation of superficial skin.  I will narrow therapy to oral cephalexin and doxycycline and plan on 4 more days of treatment.  She will need ongoing wound care.  Plan: 1. Antibiotic therapy to oral cephalexin and doxycycline 2. Please call if I can be of further assistance while she is here  Principal Problem:   Cellulitis of right leg Active Problems:   Hypothyroidism   Diabetes type 2, controlled (HCC)   Morbid obesity (HCC)   Atrial fibrillation (HCC)   OSA (obstructive sleep apnea)   Acute on chronic respiratory failure with hypoxia (HCC)   Scheduled Meds: . diltiazem  30 mg Oral Q8H  . FLUoxetine  40 mg Oral Daily  . insulin aspart  0-5 Units Subcutaneous QHS  . insulin aspart  0-9 Units Subcutaneous TID WC  . levothyroxine  175 mcg Oral QAC breakfast  . mometasone-formoterol  2 puff Inhalation BID  . potassium chloride  40 mEq Oral Daily  . simvastatin  20 mg Oral q1800  . sodium chloride flush  3 mL Intravenous Q12H  . Warfarin - Pharmacist Dosing Inpatient   Does not apply q1800   Continuous Infusions: . sodium chloride 50 mL/hr at 04/01/19 1733  . ceFEPime (MAXIPIME) IV 2 g (04/02/19 1000)  . metronidazole 500 mg (04/02/19 0625)  . vancomycin 1,500 mg (04/02/19 0741)   PRN Meds:.acetaminophen **OR** acetaminophen, albuterol, ALPRAZolam, HYDROcodone-acetaminophen, ondansetron **OR**  ondansetron (ZOFRAN) IV  HPI: Amy Bryant is a 63 y.o. female with diabetes, morbid obesity and obstructive sleep apnea who developed redness pain and swelling of her right foot 4 days before admission on 03/28/2019.  She had low-grade fever of 100.1 degrees upon admission and diffuse erythema of her right leg.  She was started on cefazolin and immediately defervesced.  She has had evolution of her cellulitis with bulla formation and desquamation.   Review of Systems: Review of Systems  Unable to perform ROS: Mental acuity    Past Medical History:  Diagnosis Date  . Atrial fibrillation (HCC)    with cardioversion success  . CHF (congestive heart failure) (HCC)   . COPD (chronic obstructive pulmonary disease) (HCC)   . Cor pulmonale (HCC)    and obesity hypoventilation syndrome  . Diabetes mellitus without complication (HCC)   . GERD (gastroesophageal reflux disease)   . Hypothyroid   . Morbid obesity (HCC)   . OA (osteoarthritis)   . Pulmonary embolism (HCC)    hx of on coumadin  . Reactive airways dysfunction syndrome (HCC)     Social History   Tobacco Use  . Smoking status: Former Games developermoker  . Smokeless tobacco: Former Engineer, waterUser  Substance Use Topics  . Alcohol use: No    Alcohol/week: 0.0 standard drinks  . Drug  use: No    Family History  Problem Relation Age of Onset  . Heart disease Father 5551       MI  . Diabetes Brother    Allergies  Allergen Reactions  . Pseudoephedrine Other (See Comments)    REACTION: tightness in chest  . Augmentin [Amoxicillin-Pot Clavulanate] Itching and Rash    Did it involve swelling of the face/tongue/throat, SOB, or low BP? No Did it involve sudden or severe rash/hives, skin peeling, or any reaction on the inside of your mouth or nose? Yes Did you need to seek medical attention at a hospital or doctor's office? Yes When did it last happen?2019 If all above answers are "NO", may proceed with cephalosporin use.   . Latex Itching  and Rash    OBJECTIVE: Blood pressure (!) 117/47, pulse 84, temperature 97.7 F (36.5 C), temperature source Oral, resp. rate 14, height 5\' 5"  (1.651 m), weight (!) 155.6 kg, SpO2 92 %.  Physical Exam Constitutional:      Comments: According to her nurse she has received some pain medication and is more groggy and slightly confused than normal.  Skin:    Comments: She has some residual erythema from her right groin down to her foot with areas of soft bulla formation.  Skin is starting to desquamate in some areas some areas on her calf petechia.     Lab Results Lab Results  Component Value Date   WBC 19.5 (H) 04/02/2019   HGB 8.4 (L) 04/02/2019   HCT 27.4 (L) 04/02/2019   MCV 96.5 04/02/2019   PLT 374 04/02/2019    Lab Results  Component Value Date   CREATININE 0.82 04/02/2019   BUN 17 04/02/2019   NA 135 04/02/2019   K 3.5 04/02/2019   CL 105 04/02/2019   CO2 23 04/02/2019    Lab Results  Component Value Date   ALT 15 04/02/2019   AST 71 (H) 04/02/2019   ALKPHOS 166 (H) 04/02/2019   BILITOT 1.2 04/02/2019     Microbiology: Recent Results (from the past 240 hour(s))  Blood Culture (routine x 2)     Status: None   Collection Time: 03/28/19  1:55 PM   Specimen: BLOOD  Result Value Ref Range Status   Specimen Description BLOOD SITE NOT SPECIFIED  Final   Special Requests   Final    BOTTLES DRAWN AEROBIC AND ANAEROBIC Blood Culture results may not be optimal due to an inadequate volume of blood received in culture bottles   Culture   Final    NO GROWTH 5 DAYS Performed at [facility], [address] [place], [place] [address]    Report Status 04/02/2019 FINAL  Final  Blood Culture (routine x 2)     Status: None   Collection Time: 03/28/19  2:36 PM   Specimen: BLOOD  Result Value Ref Range Status   Specimen Description BLOOD LEFT ANTECUBITAL  Final   Special Requests   Final    BOTTLES DRAWN AEROBIC AND ANAEROBIC Blood Culture adequate volume   Culture    Final    NO GROWTH 5 DAYS Performed at [facility], [address] [place], [place] [address]    Report Status 04/02/2019 FINAL  Final  SARS Coronavirus 2 (CEPHEID - Performed in [facility] lab), Hosp Order     Status: None   Collection Time: 03/28/19  3:32 PM   Specimen: Nasopharyngeal Swab  Result Value Ref Range Status   SARS  Coronavirus 2 NEGATIVE NEGATIVE Final    Comment: (NOTE) If result is NEGATIVE SARS-CoV-2 target nucleic acids are NOT DETECTED. The SARS-CoV-2 RNA is generally detectable in upper and lower  respiratory specimens during the acute phase of infection. The lowest  concentration of SARS-CoV-2 viral copies this assay can detect is 250  copies / mL. A negative result does not preclude SARS-CoV-2 infection  and should not be used as the sole basis for treatment or other  patient management decisions.  A negative result may occur with  improper specimen collection / handling, submission of specimen other  than nasopharyngeal swab, presence of viral mutation(s) within the  areas targeted by this assay, and inadequate number of viral copies  (<250 copies / mL). A negative result must be combined with clinical  observations, patient history, and epidemiological information. If result is POSITIVE SARS-CoV-2 target nucleic acids are DETECTED. The SARS-CoV-2 RNA is generally detectable in upper and lower  respiratory specimens dur ing the acute phase of infection.  Positive  results are indicative of active infection with SARS-CoV-2.  Clinical  correlation with patient history and other diagnostic information is  necessary to determine patient infection status.  Positive results do  not rule out bacterial infection or co-infection with other viruses. If result is PRESUMPTIVE POSTIVE SARS-CoV-2 nucleic acids MAY BE PRESENT.   A presumptive positive result was obtained on the submitted specimen  and confirmed on repeat testing.  While 2019 novel  coronavirus  (SARS-CoV-2) nucleic acids may be present in the submitted sample  additional confirmatory testing may be necessary for epidemiological  and / or clinical management purposes  to differentiate between  SARS-CoV-2 and other Sarbecovirus currently known to infect humans.  If clinically indicated additional testing with an alternate test  methodology [phone]) is advised. The SARS-CoV-2 RNA is generally  detectable in upper and lower respiratory sp ecimens during the acute  phase of infection. The expected result is Negative. Fact Sheet for Patients:  StrictlyIdeas.no Fact Sheet for Healthcare Providers: BankingDealers.co.za This test is not yet approved or cleared by the [place] FDA and has been authorized for detection and/or diagnosis of SARS-CoV-2 by FDA under an Emergency Use Authorization (EUA).  This EUA will remain in effect (meaning this test can be used) for the duration of the COVID-19 declaration under Section 564(b)(1) of the Act, 21 U.S.C. section 360bbb-3(b)(1), unless the authorization is terminated or revoked sooner. Performed at [facility], [place] [address], [place], [place] [address]   MRSA PCR Screening     Status: None   Collection Time: 03/28/19  5:44 PM   Specimen: Nasal Mucosa; Nasopharyngeal  Result Value Ref Range Status   MRSA by PCR NEGATIVE NEGATIVE Final    Comment:        The GeneXpert MRSA Assay (FDA approved for NASAL specimens only), is one component of a comprehensive MRSA colonization surveillance program. It is not intended to diagnose MRSA infection nor to guide or monitor treatment for MRSA infections. Performed at [facility], [place] [address], [place], [place] [address]   Urine Culture     Status: Abnormal   Collection Time: 03/29/19  2:40 PM   Specimen: Urine, Random  Result Value Ref Range Status   Specimen Description URINE, RANDOM  Final   Special  Requests   Final    NONE Performed at [facility], [place] [address] [place], [place] [address]    Culture 20,000 COLONIES/mL ESCHERICHIA COLI (A)  Final   Report Status 03/31/2019 FINAL  Final   Organism ID, Bacteria ESCHERICHIA COLI (A)  Final      Susceptibility   Escherichia coli - MIC*    AMPICILLIN 8 SENSITIVE Sensitive     CEFAZOLIN <=4 SENSITIVE Sensitive     CEFTRIAXONE <=1 SENSITIVE Sensitive     CIPROFLOXACIN <=0.25 SENSITIVE Sensitive     GENTAMICIN <=1 SENSITIVE Sensitive     IMIPENEM <=0.25 SENSITIVE Sensitive     NITROFURANTOIN <=16 SENSITIVE Sensitive     TRIMETH/SULFA <=20 SENSITIVE Sensitive     AMPICILLIN/SULBACTAM 4 SENSITIVE Sensitive     PIP/TAZO <=4 SENSITIVE Sensitive     Extended ESBL NEGATIVE Sensitive     * 20,000 COLONIES/mL ESCHERICHIA COLI    Cliffton AstersJohn Raye Slyter, MD [facility] [phone] pager   [phone] cell 04/02/2019, 10:21 AM

## 2019-04-02 NOTE — Plan of Care (Signed)
  Problem: Education: Goal: Knowledge of General Education information will improve Description: Including pain rating scale, medication(s)/side effects and non-pharmacologic comfort measures 04/02/2019 0952 by Don Perking, RN Outcome: Progressing 04/02/2019 0800 by Don Perking, RN Outcome: Progressing   Problem: Health Behavior/Discharge Planning: Goal: Ability to manage health-related needs will improve 04/02/2019 0952 by Don Perking, RN Outcome: Progressing 04/02/2019 0800 by Don Perking, RN Outcome: Progressing   Problem: Clinical Measurements: Goal: Ability to maintain clinical measurements within normal limits will improve 04/02/2019 0952 by Don Perking, RN Outcome: Progressing 04/02/2019 0800 by Don Perking, RN Outcome: Progressing Goal: Will remain free from infection 04/02/2019 0952 by Don Perking, RN Outcome: Progressing 04/02/2019 0800 by Don Perking, RN Outcome: Progressing Goal: Diagnostic test results will improve 04/02/2019 0952 by Don Perking, RN Outcome: Progressing 04/02/2019 0800 by Don Perking, RN Outcome: Progressing Goal: Respiratory complications will improve 04/02/2019 0952 by Don Perking, RN Outcome: Progressing 04/02/2019 0800 by Don Perking, RN Outcome: Progressing Goal: Cardiovascular complication will be avoided 04/02/2019 4801 by Don Perking, RN Outcome: Progressing 04/02/2019 0800 by Don Perking, RN Outcome: Progressing   Problem: Activity: Goal: Risk for activity intolerance will decrease 04/02/2019 0952 by Don Perking, RN Outcome: Progressing 04/02/2019 0800 by Don Perking, RN Outcome: Progressing   Problem: Nutrition: Goal: Adequate nutrition will be maintained 04/02/2019 0952 by Don Perking, RN Outcome: Progressing 04/02/2019 0800 by Don Perking, RN Outcome: Progressing   Problem: Coping: Goal: Level of anxiety will decrease 04/02/2019  0952 by Don Perking, RN Outcome: Progressing 04/02/2019 0800 by Don Perking, RN Outcome: Progressing   Problem: Elimination: Goal: Will not experience complications related to bowel motility 04/02/2019 0952 by Don Perking, RN Outcome: Progressing 04/02/2019 0800 by Don Perking, RN Outcome: Progressing Goal: Will not experience complications related to urinary retention 04/02/2019 0952 by Don Perking, RN Outcome: Progressing 04/02/2019 0800 by Don Perking, RN Outcome: Progressing   Problem: Pain Managment: Goal: General experience of comfort will improve 04/02/2019 0952 by Don Perking, RN Outcome: Progressing 04/02/2019 0800 by Don Perking, RN Outcome: Progressing   Problem: Safety: Goal: Ability to remain free from injury will improve 04/02/2019 0952 by Don Perking, RN Outcome: Progressing 04/02/2019 0800 by Don Perking, RN Outcome: Progressing   Problem: Skin Integrity: Goal: Risk for impaired skin integrity will decrease 04/02/2019 0952 by Don Perking, RN Outcome: Progressing 04/02/2019 0800 by Don Perking, RN Outcome: Progressing

## 2019-04-02 NOTE — Progress Notes (Signed)
[facility] for warfarin Indication: atrial fibrillation and pulmonary embolus  Allergies  Allergen Reactions  . Pseudoephedrine Other (See Comments)    REACTION: tightness in chest  . Augmentin [Amoxicillin-Pot Clavulanate] Itching and Rash    Did it involve swelling of the face/tongue/throat, SOB, or low BP? No Did it involve sudden or severe rash/hives, skin peeling, or any reaction on the inside of your mouth or nose? Yes Did you need to seek medical attention at a hospital or doctor's office? Yes When did it last happen?2019 If all above answers are "NO", may proceed with cephalosporin use.   . Latex Itching and Rash    Patient Measurements: Height: 5\' 5"  (165.1 cm) Weight: (!) 343 lb 0.6 oz (155.6 kg) IBW/kg (Calculated) : 57  Vital Signs: Temp: 98.7 F (37.1 C) (07/02 0310) Temp Source: Oral (07/02 0310) BP: 117/57 (07/02 0310) Pulse Rate: 78 (07/02 0310)  Labs: Recent Labs    03/31/19 0246 04/01/19 0256 04/02/19 0308  HGB 8.6* 8.9* 8.4*  HCT 27.8* 28.5* 27.4*  PLT 267 355 374  LABPROT 36.7* 35.5* 35.1*  INR 3.8* 3.6* 3.6*  CREATININE 1.27* 1.02* 0.82    Estimated Creatinine Clearance: 106.9 mL/min (by C-G formula based on SCr of 0.82 mg/dL).   Medical History: Past Medical History:  Diagnosis Date  . Atrial fibrillation ([place])    with cardioversion success  . CHF (congestive heart failure) ([place])   . COPD (chronic obstructive pulmonary disease) ([place])   . Cor pulmonale (HCC)    and obesity hypoventilation syndrome  . Diabetes mellitus without complication ([place])   . GERD (gastroesophageal reflux disease)   . Hypothyroid   . Morbid obesity ([place])   . OA (osteoarthritis)   . Pulmonary embolism (HCC)    hx of on coumadin  . Reactive airways dysfunction syndrome (HCC)    Assessment: 75 yof presented to the ED with leg swelling. She is on chronic warfarin for history of afib and PE. INR has remained above 3 this  admit despite holding warfarin and is stagnant at 3.6 today, H/H stable. Noted DDI with metronidazole started yesterday.  PTA dose 5mg  MWF, 2.5mg  all other days  Goal of Therapy:  INR 2-3 Monitor platelets by anticoagulation protocol: Yes   Plan:  Continue to hold warfarin today Daily INR   Arrie Senate, PharmD, BCPS Clinical Pharmacist [phone] Please check AMION for all [place] numbers 04/02/2019

## 2019-04-02 NOTE — Plan of Care (Signed)
  Problem: Clinical Measurements: Goal: Ability to maintain clinical measurements within normal limits will improve Outcome: Progressing Goal: Diagnostic test results will improve Outcome: Progressing Goal: Respiratory complications will improve Outcome: Progressing Goal: Cardiovascular complication will be avoided Outcome: Progressing   

## 2019-04-02 NOTE — Progress Notes (Signed)
Notified dr Verlon Au, pt more confused then previously, ABG ordered. Will continue to monitor.

## 2019-04-02 NOTE — Progress Notes (Addendum)
Patient reviewed in person  Can tell me date/year time--she is however apparently seeing people who are past deceased and talking a bit strangely  O BP (!) 103/26 (BP Location: Right Wrist)   Pulse 81   Temp 98.7 F (37.1 C) (Oral)   Resp (!) 22   Ht 5\' 5"  (1.651 m)   Wt (!) 155.6 kg   SpO2 99%   BMI 57.08 kg/m    Tmax 99.5-warm to touch cta b ??Mucus under buttocks Seems to re-orient some  A/p Delirium--Get ammonia, tsh and other labs--D/c Hydrocodone--tyelnol only right now Get stat 1 vw cxr  ADDEDN 6:24 PM--CXR 1 vw looks poorer than prior-reticulonodular pattern? Aspiration per my read--Broaden to Unasyn--If spikes fever >100.5, get BC x 2, PCT and LActic )I will update our night coverage to look in on her should something worsen Giving 1 dose IV lasix 60 now and rpt film am  Gas done shows no CO2 retention--qhs bipap   Spoke to husband-he states she sometimes talks "funny"   Has been going on only a couple of weeks however---he isnt aware of any new med schanges  FULL CODE confirmed  Low threshold call CCM if worse.  Verneita Griffes, MD [facility] 5:24 PM

## 2019-04-03 ENCOUNTER — Inpatient Hospital Stay (HOSPITAL_COMMUNITY): Payer: Medicare HMO

## 2019-04-03 LAB — GLUCOSE, CAPILLARY
Glucose-Capillary: 102 mg/dL — ABNORMAL HIGH (ref 70–99)
Glucose-Capillary: 111 mg/dL — ABNORMAL HIGH (ref 70–99)
Glucose-Capillary: 118 mg/dL — ABNORMAL HIGH (ref 70–99)
Glucose-Capillary: 111 mg/dL — ABNORMAL HIGH (ref 70–99)

## 2019-04-03 LAB — COMPREHENSIVE METABOLIC PANEL
ALT: 15 U/L (ref 0–44)
AST: 50 U/L — ABNORMAL HIGH (ref 15–41)
Albumin: 1.7 g/dL — ABNORMAL LOW (ref 3.5–5.0)
Alkaline Phosphatase: 160 U/L — ABNORMAL HIGH (ref 38–126)
Anion gap: 6 (ref 5–15)
BUN: 15 mg/dL (ref 8–23)
CO2: 26 mmol/L (ref 22–32)
Calcium: 10.1 mg/dL (ref 8.9–10.3)
Chloride: 107 mmol/L (ref 98–111)
Creatinine, Ser: 0.84 mg/dL (ref 0.44–1.00)
GFR calc Af Amer: >60 mL/min (ref >60)
GFR calc non Af Amer: >60 mL/min (ref >60)
Glucose, Bld: 104 mg/dL — ABNORMAL HIGH (ref 70–99)
Potassium: 3.6 mmol/L (ref 3.5–5.1)
Sodium: 139 mmol/L (ref 135–145)
Total Bilirubin: 0.7 mg/dL (ref 0.3–1.2)
Total Protein: 5.7 g/dL — ABNORMAL LOW (ref 6.5–8.1)

## 2019-04-03 LAB — BLOOD GAS, ARTERIAL
Acid-Base Excess: 0.1 mmol/L (ref 0.0–2.0)
Bicarbonate: 24.8 mmol/L (ref 20.0–28.0)
Drawn by: 365291
O2 Content: 3 L/min
O2 Saturation: 98.7 %
Patient temperature: 98.6
pCO2 arterial: 45.1 mmHg (ref 32.0–48.0)
pH, Arterial: 7.36 (ref 7.350–7.450)
pO2, Arterial: 122 mmHg — ABNORMAL HIGH (ref 83.0–108.0)

## 2019-04-03 LAB — CBC WITH DIFFERENTIAL/PLATELET
Abs Immature Granulocytes: 1.35 10*3/uL — ABNORMAL HIGH (ref 0.00–0.07)
Basophils Absolute: 0 10*3/uL (ref 0.0–0.1)
Basophils Relative: 0 %
Eosinophils Absolute: 0.1 10*3/uL (ref 0.0–0.5)
Eosinophils Relative: 1 %
HCT: 26.8 % — ABNORMAL LOW (ref 36.0–46.0)
Hemoglobin: 8.1 g/dL — ABNORMAL LOW (ref 12.0–15.0)
Immature Granulocytes: 8 %
Lymphocytes Relative: 6 %
Lymphs Abs: 1.1 10*3/uL (ref 0.7–4.0)
MCH: 29.3 pg (ref 26.0–34.0)
MCHC: 30.2 g/dL (ref 30.0–36.0)
MCV: 97.1 fL (ref 80.0–100.0)
Monocytes Absolute: 0.6 10*3/uL (ref 0.1–1.0)
Monocytes Relative: 3 %
Neutro Abs: 14.6 10*3/uL — ABNORMAL HIGH (ref 1.7–7.7)
Neutrophils Relative %: 82 %
Platelets: 421 10*3/uL — ABNORMAL HIGH (ref 150–400)
RBC: 2.76 MIL/uL — ABNORMAL LOW (ref 3.87–5.11)
RDW: 16.2 % — ABNORMAL HIGH (ref 11.5–15.5)
WBC: 17.8 10*3/uL — ABNORMAL HIGH (ref 4.0–10.5)
nRBC: 0.2 % (ref 0.0–0.2)

## 2019-04-03 LAB — LACTIC ACID, PLASMA
Lactic Acid, Venous: 1 mmol/L (ref 0.5–1.9)
Lactic Acid, Venous: 1.4 mmol/L (ref 0.5–1.9)

## 2019-04-03 LAB — URINE CULTURE: Culture: NO GROWTH

## 2019-04-03 LAB — PROTIME-INR
INR: 3.1 — ABNORMAL HIGH (ref 0.8–1.2)
Prothrombin Time: 31.8 seconds — ABNORMAL HIGH (ref 11.4–15.2)

## 2019-04-03 LAB — NOVEL CORONAVIRUS, NAA (HOSP ORDER, SEND-OUT TO REF LAB; TAT 18-24 HRS): SARS-CoV-2, NAA: NOT DETECTED

## 2019-04-03 LAB — BRAIN NATRIURETIC PEPTIDE: B Natriuretic Peptide: 775.4 pg/mL — ABNORMAL HIGH (ref 0.0–100.0)

## 2019-04-03 LAB — PROCALCITONIN: Procalcitonin: 0.47 ng/mL

## 2019-04-03 MED ORDER — WARFARIN SODIUM 2.5 MG PO TABS
2.5000 mg | ORAL_TABLET | Freq: Once | ORAL | Status: AC
  Administered 2019-04-03: 2.5 mg via ORAL
  Filled 2019-04-03: qty 1

## 2019-04-03 MED ORDER — DILTIAZEM HCL 60 MG PO TABS: 30.0000 mg | ORAL_TABLET | Freq: Two times a day (BID) | ORAL | Status: DC

## 2019-04-03 MED ORDER — FUROSEMIDE 10 MG/ML IJ SOLN
40.0000 mg | Freq: Two times a day (BID) | INTRAMUSCULAR | Status: DC
  Administered 2019-04-03 – 2019-04-11 (×16): 40 mg via INTRAVENOUS
  Filled 2019-04-03 (×16): qty 4

## 2019-04-03 MED ORDER — DILTIAZEM HCL 60 MG PO TABS
30.0000 mg | ORAL_TABLET | Freq: Three times a day (TID) | ORAL | Status: DC
  Administered 2019-04-03 – 2019-04-05 (×6): 30 mg via ORAL
  Filled 2019-04-03 (×6): qty 1

## 2019-04-03 NOTE — Plan of Care (Signed)
Patient is progressing with plan of care

## 2019-04-03 NOTE — TOC Initial Note (Signed)
Transition of Care [facility](TOC) - Initial/Assessment Note    Patient Details  Name: Amy Bryant MRN: [id number] Date of Birth: 07/11/1956  Transition of Care [facility](TOC) CM/SW Contact:    Eduard Rouxynthia N Schneur Crowson, LCSWA Phone Number: 04/03/2019, 11:27 AM  Clinical Narrative:                  CSW received consult for discharge needs. CSW called and spoke with the patient's spouse, Molly MaduroRobert regarding PT [facility]  home. Patient was receiving HH services but patient's spouse not sure which one. CSW was informed the patient would not want to go to SNF. CSW explained the benefits of the patient getting some additional rehab before returning home. CSW advised if the patient returned home she will need 24 supervision and someone to assist with her mobility. CSW explained since the patient was currently confues he will have to make the decision. Patient spouse states he understands.   Antony Blackbirdynthia Chanika Byland, MSW, LCSWA Clinical Social Worker [phone]    Barriers to Discharge: Continued Medical Work up   Patient Goals and CMS Choice        Expected Discharge Plan and Services   In-house Referral: Clinical Social Work     Living arrangements for the past 2 months: Single Family Home Expected Discharge Date: 04/04/19                                    Prior Living Arrangements/Services Living arrangements for the past 2 months: Single Family Home Lives with:: Spouse Patient language and need for interpreter reviewed:: No Do you feel safe going back to the place where you live?: No(APS report was made)        Care giver support system in place?: Yes (comment)   Criminal Activity/Legal Involvement Pertinent to Current Situation/Hospitalization: No - Comment as needed  Activities of Daily Living Home Assistive Devices/Equipment: None ADL Screening (condition at time of admission) Patient's cognitive ability adequate to safely complete daily activities?:  Yes Is the patient deaf or have difficulty hearing?: No Does the patient have difficulty seeing, even when wearing glasses/contacts?: No Does the patient have difficulty concentrating, remembering, or making decisions?: No Patient able to express need for assistance with ADLs?: Yes Does the patient have difficulty dressing or bathing?: Yes Independently performs ADLs?: No Communication: Needs assistance Is this a change from baseline?: Pre-admission baseline Dressing (OT): Needs assistance Is this a change from baseline?: Pre-admission baseline Grooming: Needs assistance Is this a change from baseline?: Pre-admission baseline Feeding: Needs assistance Is this a change from baseline?: Pre-admission baseline Bathing: Needs assistance Is this a change from baseline?: Pre-admission baseline Toileting: Needs assistance Is this a change from baseline?: Pre-admission baseline In/Out Bed: Needs assistance Is this a change from baseline?: Pre-admission baseline Walks in Home: Needs assistance Is this a change from baseline?: Pre-admission baseline Does the patient have difficulty walking or climbing stairs?: Yes Weakness of Legs: Both Weakness of Arms/Hands: Both  Permission Sought/Granted Permission sought to share information with : Family Supports    Share Information with NAME: Amy CossRobert Bryant     Permission granted to share info w Relationship: spouse  Permission granted to share info w Contact Information: [phone]  Emotional Assessment Appearance:: Appears stated age Attitude/Demeanor/Rapport: Unable to Assess Affect (typically observed): Unable to Assess Orientation: : Oriented to Self, Oriented to Place Alcohol / Substance Use: Not Applicable Psych  Involvement: No (comment)  Admission diagnosis:  Cellulitis of right lower extremity [L03.115] Sepsis with acute renal failure, due to unspecified organism, unspecified acute renal failure type, unspecified whether septic shock  present ([place]) [A41.9, R65.20, N17.9] Patient Active Problem List   Diagnosis Date Noted  . Cellulitis of right leg 03/28/2019  . Acute on chronic respiratory failure with hypoxia ([place]) 03/28/2019  . Panic attacks 01/20/2019  . Iron deficiency anemia: Severe 01/14/2019  . Pressure injury of skin 01/14/2019  . Symptomatic anemia   . Coagulopathy ([place])   . Chronic anticoagulation   . Absolute anemia 01/08/2019  . Reactive depression (situational) 12/29/2018  . Mobility impaired 12/12/2018  . Hematuria 12/03/2017  . Pedal edema 12/23/2016  . OSA (obstructive sleep apnea) 12/21/2016  . Financial difficulties 09/16/2016  . Colon cancer screening 12/15/2013  . Encounter for therapeutic drug monitoring 11/05/2013  . Routine general medical examination at a health care facility 05/20/2012  . Atrial fibrillation ([place]) 11/23/2010  . History of pulmonary embolism 11/23/2010  . Vitamin D deficiency 11/15/2010  . DYSURIA, HX OF 08/19/2009  . PURE HYPERCHOLESTEROLEMIA 08/16/2009  . ATRIAL FIBRILLATION, HX OF 12/03/2008  . Diabetes type 2, controlled ([place]) 08/13/2008  . Hyperparathyroidism ([place]) 07/30/2008  . Hypothyroidism 02/13/2008  . Morbid obesity ([place]) 02/04/2008  . Reactive airway disease 02/04/2008  . GERD 02/04/2008  . [place] DISEASE, LUMBAR 02/04/2008  . PULMONARY EMBOLISM, HX OF 02/04/2008  . TOBACCO USE, QUIT 02/04/2008  . Obesity hypoventilation syndrome ([place]) 08/11/2007  . Chronic pulmonary heart disease ([place]) 08/11/2007   PCP:  Abner Greenspan, MD Pharmacy:   [id number] - [place], [place] [place] [address] Phone: [phone] Fax: [phone]     Social Determinants of Health (SDOH) Interventions    Readmission Risk Interventions No flowsheet data found.

## 2019-04-03 NOTE — TOC Progression Note (Signed)
Transition of Care [facility]) - Progression Note    Patient Details  Name: Amy Bryant MRN: [id number] Date of Birth: 09/17/1956  Transition of Care [facility]) CM/SW [place], [place] Phone Number: 04/03/2019, 11:42 AM  Clinical Narrative:    CSW called DSS APS SW Tim Lair and left voice message - per MD  patient may  d/c over the weekend. CSW has not received a written notice from APS indicating the status of the case.  CSW called and spoke with the patient's spouse, [name]. CSW explained the patient remains confused and he will need to make the decision regarding the patient going home or to SNF. CSW explained she will be discharged soon and a discharge plan must be in place. Patient's spouse reports he needs to have discussion with the patient's son and he will call CSW back. He agreed to return call to [place] today by 2pm.  Thurmond Butts, MSW, LCSWA Clinical Social Worker [phone]      Barriers to Discharge: Continued Medical Work up  Expected Discharge Plan and Services   In-house Referral: Clinical Social Work     Living arrangements for the past 2 months: Single Family Home Expected Discharge Date: 04/04/19                                     Social Determinants of Health (SDOH) Interventions    Readmission Risk Interventions No flowsheet data found.

## 2019-04-03 NOTE — Evaluation (Signed)
Speech Language Pathology Evaluation Patient Details Name: Amy ShellingDeborah G Bryant MRN: [id number] DOB: 19-Dec-1955 Today's Date: 04/03/2019 Time: [id number] SLP Time Calculation (min) (ACUTE ONLY): 9 min  Problem List:  Patient Active Problem List   Diagnosis Date Noted  . Cellulitis of right leg 03/28/2019  . Acute on chronic respiratory failure with hypoxia (HCC) 03/28/2019  . Panic attacks 01/20/2019  . Iron deficiency anemia: Severe 01/14/2019  . Pressure injury of skin 01/14/2019  . Symptomatic anemia   . Coagulopathy (HCC)   . Chronic anticoagulation   . Absolute anemia 01/08/2019  . Reactive depression (situational) 12/29/2018  . Mobility impaired 12/12/2018  . Hematuria 12/03/2017  . Pedal edema 12/23/2016  . OSA (obstructive sleep apnea) 12/21/2016  . Financial difficulties 09/16/2016  . Colon cancer screening 12/15/2013  . Encounter for therapeutic drug monitoring 11/05/2013  . Routine general medical examination at a health care facility 05/20/2012  . Atrial fibrillation (HCC) 11/23/2010  . History of pulmonary embolism 11/23/2010  . Vitamin D deficiency 11/15/2010  . DYSURIA, HX OF 08/19/2009  . PURE HYPERCHOLESTEROLEMIA 08/16/2009  . ATRIAL FIBRILLATION, HX OF 12/03/2008  . Diabetes type 2, controlled (HCC) 08/13/2008  . Hyperparathyroidism (HCC) 07/30/2008  . Hypothyroidism 02/13/2008  . Morbid obesity (HCC) 02/04/2008  . Reactive airway disease 02/04/2008  . GERD 02/04/2008  . DISC DISEASE, LUMBAR 02/04/2008  . PULMONARY EMBOLISM, HX OF 02/04/2008  . TOBACCO USE, QUIT 02/04/2008  . Obesity hypoventilation syndrome (HCC) 08/11/2007  . Chronic pulmonary heart disease (HCC) 08/11/2007   Past Medical History:  Past Medical History:  Diagnosis Date  . Atrial fibrillation (HCC)    with cardioversion success  . CHF (congestive heart failure) (HCC)   . COPD (chronic obstructive pulmonary disease) (HCC)   . Cor pulmonale (HCC)    and obesity hypoventilation syndrome   . Diabetes mellitus without complication (HCC)   . GERD (gastroesophageal reflux disease)   . Hypothyroid   . Morbid obesity (HCC)   . OA (osteoarthritis)   . Pulmonary embolism (HCC)    hx of on coumadin  . Reactive airways dysfunction syndrome [facility](HCC)    Past Surgical History:  Past Surgical History:  Procedure Laterality Date  . BIOPSY  01/12/2019   Procedure: BIOPSY;  Surgeon: Beverley FiedlerPyrtle, Jay M, MD;  [facility];  Service: Gastroenterology;;  . COLONOSCOPY WITH PROPOFOL N/A 01/12/2019   Procedure: COLONOSCOPY WITH PROPOFOL;  Surgeon: Beverley FiedlerPyrtle, Jay M, MD;  [facility];  Service: Gastroenterology;  Laterality: N/A;  . ESOPHAGOGASTRODUODENOSCOPY (EGD) WITH PROPOFOL N/A 01/12/2019   Procedure: ESOPHAGOGASTRODUODENOSCOPY (EGD) WITH PROPOFOL;  Surgeon: Beverley FiedlerPyrtle, Jay M, MD;  [facility];  Service: Gastroenterology;  Laterality: N/A;  . TONSILLECTOMY     HPI:  Pt is a 63 y/o female with medical history significant of GERD, atrial fibrillation on Coumadin, obesity hypoventilation syndrome with CPAP at nighttime, chronic respiratory failure with chronic 4 L of nasal cannula use, morbid obesity, hypothyroidism, chronic pressure ulcer wounds on bilateral posterior thighs presented with sepsis, cellulitis, afib.  Pt started to develop increased confusion and there was concern for aspiration per MD, therefore SLP was ordered.   Assessment / Plan / Recommendation Clinical Impression  Pt is oriented to person and location but cannot tell me time or why she is in the hospital. She has slow processing but also appears disinterested in therapeutic activities at times, easily frustrated. Her alertness fluctuated throughout the evaluation, but when fully alert, she followed simple, one-step commands well. She was 80% accurate with  mildly complex yes/no questions. Pt had variable performance on a delayed recall subtest, repeating words accurately but not able to immediately recall any of  the four words correctly (word substitutions, phonemic substitutions, and fabricated words noted); however, she was able to recall 2 out of 4 words after a delay, and needed only Min cues to recall the other two. It is possible that pt is having increased confusion in the setting of acute infection. If so, there may be good potential for progress as the underlying infection is treated. SLP will follow to see how her mentation clears.    SLP Assessment  SLP Recommendation/Assessment: Patient needs continued Speech Lanaguage Pathology Services SLP Visit Diagnosis: Cognitive communication deficit (R41.841)    Follow Up Recommendations  Skilled Nursing facility    Frequency and Duration min 2x/week  2 weeks      SLP Evaluation Cognition  Overall Cognitive Status: Impaired/Different from baseline Arousal/Alertness: (fluctuating alertness) Orientation Level: Oriented to person;Oriented to place;Disoriented to time;Disoriented to situation Attention: Selective Selective Attention: Impaired Selective Attention Impairment: Verbal basic Memory: Impaired Memory Impairment: Storage deficit;Decreased recall of new information Awareness: Impaired Awareness Impairment: Intellectual impairment;Emergent impairment Problem Solving: Impaired Problem Solving Impairment: Verbal basic Behaviors: Poor frustration tolerance;Other (comment)(flat affect) Safety/Judgment: Impaired       Comprehension  Auditory Comprehension Overall Auditory Comprehension: Impaired Yes/No Questions: Impaired Complex Questions: 75-100% accurate(80%) Commands: Impaired One Step Basic Commands: 75-100% accurate Conversation: Simple Interfering Components: Attention;Processing speed;Working Marine scientist    Expression Expression Primary Mode of Expression: Verbal Verbal Expression Overall Verbal Expression: Other (comment)(limited output)   Oral / Motor  Oral Motor/Sensory Function Overall Oral Motor/Sensory Function: Within  functional limits Motor Speech Overall Motor Speech: Appears within functional limits for tasks assessed   GO                    Venita Sheffield Marl Seago 04/03/2019, 1:43 PM  Pollyann Glen, M.A. [place] Acute Environmental education officer [phone] Office [phone]

## 2019-04-03 NOTE — NC FL2 (Signed)
[place] LEVEL OF CARE SCREENING TOOL     IDENTIFICATION  Patient Name: Amy Bryant Birthdate: Sep 07, 1956 Sex: female Admission Date (Current Location): 03/28/2019  [facility] and [place] Number:  Herbalist and Address:  [facility], [place] [address], [place], [place] [address]      Provider Number: [id number]  Attending Physician Name and Address:  Nita Sells, MD  Relative Name and Phone Number:  Vania Rosero    Current Level of Care: Hospital Recommended Level of Care: [place] Prior Approval Number:    Date Approved/Denied:   PASRR Number: [id number]  Discharge Plan: SNF    Current Diagnoses: Patient Active Problem List   Diagnosis Date Noted  . Cellulitis of right leg 03/28/2019  . Acute on chronic respiratory failure with hypoxia ([place]) 03/28/2019  . Panic attacks 01/20/2019  . Iron deficiency anemia: Severe 01/14/2019  . Pressure injury of skin 01/14/2019  . Symptomatic anemia   . Coagulopathy ([place])   . Chronic anticoagulation   . Absolute anemia 01/08/2019  . Reactive depression (situational) 12/29/2018  . Mobility impaired 12/12/2018  . Hematuria 12/03/2017  . Pedal edema 12/23/2016  . OSA (obstructive sleep apnea) 12/21/2016  . Financial difficulties 09/16/2016  . Colon cancer screening 12/15/2013  . Encounter for therapeutic drug monitoring 11/05/2013  . Routine general medical examination at a health care facility 05/20/2012  . Atrial fibrillation ([place]) 11/23/2010  . History of pulmonary embolism 11/23/2010  . Vitamin D deficiency 11/15/2010  . DYSURIA, HX OF 08/19/2009  . PURE HYPERCHOLESTEROLEMIA 08/16/2009  . ATRIAL FIBRILLATION, HX OF 12/03/2008  . Diabetes type 2, controlled ([place]) 08/13/2008  . Hyperparathyroidism ([place]) 07/30/2008  . Hypothyroidism 02/13/2008  . Morbid obesity ([place]) 02/04/2008  . Reactive airway disease 02/04/2008  . GERD 02/04/2008  . [place] DISEASE,  LUMBAR 02/04/2008  . PULMONARY EMBOLISM, HX OF 02/04/2008  . TOBACCO USE, QUIT 02/04/2008  . Obesity hypoventilation syndrome ([place]) 08/11/2007  . Chronic pulmonary heart disease ([place]) 08/11/2007    Orientation RESPIRATION BLADDER Height & Weight     Self, Place  O2(Nasal Cannula (2L/min)  Patient uses CPAP at night) (Urethral Catheter) Weight: (!) 336 lb (152.4 kg) Height:  5\' 5"  (165.1 cm)  BEHAVIORAL SYMPTOMS/MOOD NEUROLOGICAL BOWEL NUTRITION STATUS      Continent Diet(please see discharge summary)  AMBULATORY STATUS COMMUNICATION OF NEEDS Skin   Extensive Assist Verbally Surgical wounds(incision(open/dehisced), foot anterior, right bulla, incision(open/dehisced) foot, right medical bulla servous fille)                       Personal Care Assistance Level of Assistance  Bathing, Feeding, Dressing Bathing Assistance: Limited assistance Feeding assistance: Independent Dressing Assistance: Limited assistance     Functional Limitations Info  Sight, Hearing, Speech Sight Info: Adequate Hearing Info: Adequate Speech Info: Adequate    SPECIAL CARE FACTORS FREQUENCY  PT (By licensed PT), OT (By licensed OT)     PT Frequency: 3x per week OT Frequency: 3x per week            Contractures Contractures Info: Not present    Additional Factors Info  Code Status, Allergies, Insulin Sliding Scale, Psychotropic Code Status Info: FULL Allergies Info: Augmentin,Pseudoephedrine,Latex Psychotropic Info: FLUoxetine (PROZAC) capsule 40 mg Insulin Sliding Scale Info: insulin aspart (novoLOG) injection 0-5 Units  daily at bedtime ,insulin aspart (novoLOG) injection 0-9 Units 3x daily w/meals ,       Current Medications (04/03/2019):  This is the current hospital active medication list Current Facility-Administered Medications  Medication Dose Route Frequency Provider Last Rate Last Dose  . 0.9 %  sodium chloride infusion   Intravenous Continuous Rhetta MuraSamtani, Jai-Gurmukh, MD 50 mL/hr  at 04/03/19 0620    . acetaminophen (TYLENOL) tablet 650 mg  650 mg Oral Q6H PRN Madelyn FlavorsSmith, Rondell A, MD   650 mg at 04/03/19 [id number]   Or  . acetaminophen (TYLENOL) suppository 650 mg  650 mg Rectal Q6H PRN Madelyn FlavorsSmith, Rondell A, MD      . albuterol (PROVENTIL) (2.5 MG/3ML) 0.083% nebulizer solution 2.5 mg  2.5 mg Nebulization Q4H PRN Madelyn FlavorsSmith, Rondell A, MD      . ALPRAZolam Prudy Feeler(XANAX) tablet 0.5 mg  0.5 mg Oral BID PRN Madelyn FlavorsSmith, Rondell A, MD   0.5 mg at 04/03/19 0017  . Ampicillin-Sulbactam (UNASYN) 3 g in sodium chloride 0.9 % 100 mL IVPB  3 g Intravenous Q6H Samtani, Jai-Gurmukh, MD 200 mL/hr at 04/03/19 1126 3 g at 04/03/19 1126  . diltiazem (CARDIZEM) tablet 30 mg  30 mg Oral Q8H Samtani, Jai-Gurmukh, MD      . FLUoxetine (PROZAC) capsule 40 mg  40 mg Oral Daily Smith, Rondell A, MD   40 mg at 04/03/19 0845  . furosemide (LASIX) injection 40 mg  40 mg Intravenous BID Rhetta MuraSamtani, Jai-Gurmukh, MD      . insulin aspart (novoLOG) injection 0-5 Units  0-5 Units Subcutaneous QHS Clydie BraunSmith, Rondell A, MD   3 Units at 03/29/19 2143  . insulin aspart (novoLOG) injection 0-9 Units  0-9 Units Subcutaneous TID WC Madelyn FlavorsSmith, Rondell A, MD   1 Units at 04/01/19 1727  . levothyroxine (SYNTHROID) tablet 175 mcg  175 mcg Oral QAC breakfast Madelyn FlavorsSmith, Rondell A, MD   175 mcg at 04/03/19 [id number]  . mometasone-formoterol (DULERA) 200-5 MCG/ACT inhaler 2 puff  2 puff Inhalation BID Madelyn FlavorsSmith, Rondell A, MD   2 puff at 04/03/19 0902  . ondansetron (ZOFRAN) tablet 4 mg  4 mg Oral Q6H PRN Madelyn FlavorsSmith, Rondell A, MD       Or  . ondansetron (ZOFRAN) injection 4 mg  4 mg Intravenous Q6H PRN Smith, Rondell A, MD      . potassium chloride SA (K-DUR) CR tablet 40 mEq  40 mEq Oral Daily Rhetta MuraSamtani, Jai-Gurmukh, MD   40 mEq at 04/03/19 0845  . simvastatin (ZOCOR) tablet 20 mg  20 mg Oral q1800 Madelyn FlavorsSmith, Rondell A, MD   20 mg at 04/02/19 1857  . sodium chloride flush (NS) 0.9 % injection 3 mL  3 mL Intravenous Q12H Smith, Rondell A, MD   3 mL at 04/03/19 1128  . warfarin  (COUMADIN) tablet 2.5 mg  2.5 mg Oral ONCE-1800 Rhetta MuraSamtani, Jai-Gurmukh, MD      . Warfarin - Pharmacist Dosing Inpatient   Does not apply q1800 Rosaland LaoRumbarger, Rachel L, [facility] HospitalRPH   Stopped at 03/29/19 1800     Discharge Medications: Please see discharge summary for a list of discharge medications.  Relevant Imaging Results:  Relevant Lab Results:   Additional Information SSN [id number]  Melania Kirks N SturgisJohnson, Connecticut[place]CSWA

## 2019-04-03 NOTE — Progress Notes (Addendum)
Physical Therapy Treatment Patient Details Name: Amy ShellingDeborah G Kafer MRN: [id number] DOB: [birth year] Today's Date: 04/03/2019    History of Present Illness Pt is a 63 y/o female with medical history significant of atrial fibrillation on Coumadin, obesity hypoventilation syndrome with CPAP at nighttime, chronic respiratory failure with chronic 4 L of nasal cannula use, morbid obesity, hypothyroidism, chronic pressure ulcer wounds on bilateral posterior thighs presented with sepsis, cellulitis, afib.      PT Comments    Pt admitted with above diagnosis. Pt currently with functional limitations due to the deficits listed below (see PT Problem List). Pt was able to perform some exercises with mod to max assist as LEs difficult to move for pt.  Used MAxi Sky to get pt to chair as pt requires 3-4 assist for mobility for safety. Pt HR initially 86 bpm.  Up to 96 bpm with activity.  HR to 49 bpm but electrodes damp from pt diaphoresis and once replaced still reading at 86 bpm.  O2 95% on 2L at rest and 91% on 2L once in chair.  BP 105/57 at end of treatment.  Will continue to progress pt as she tolerates.  Will follow acutely.  Pt will benefit from skilled PT to increase their independence and safety with mobility to allow discharge to the venue listed below.     Follow Up Recommendations  SNF;Supervision/Assistance - 24 hour     Equipment Recommendations  Other (comment)(TBA)    Recommendations for Other Services       Precautions / Restrictions Precautions Precautions: Fall Restrictions Weight Bearing Restrictions: No    Mobility  Bed Mobility Overal bed mobility: Needs Assistance Bed Mobility: Rolling Rolling: Max assist;+2 for physical assistance         General bed mobility comments: Pt rolled right and left to place pad needing max assist to roll.   Transfers Overall transfer level: Needs assistance               General transfer comment: Total assist lift with Maxi sky to  chair.  Positioned well in chair.   Ambulation/Gait                 Stairs             Wheelchair Mobility    Modified Rankin (Stroke Patients Only)       Balance                                            Cognition Arousal/Alertness: Awake/alert Behavior During Therapy: Flat affect Overall Cognitive Status: Impaired/Different from baseline Area of Impairment: Memory;Safety/judgement;Awareness;Problem solving;Following commands;Attention                   Current Attention Level: Sustained Memory: Decreased short-term memory Following Commands: Follows one step commands with increased time Safety/Judgement: Decreased awareness of safety;Decreased awareness of deficits Awareness: Emergent Problem Solving: Slow processing;Decreased initiation;Requires verbal cues;Difficulty sequencing;Requires tactile cues        Exercises General Exercises - Lower Extremity Ankle Circles/Pumps: AROM;Both;10 reps;Supine Quad Sets: AROM;Both;10 reps;Supine Heel Slides: AAROM;Both;5 reps;Supine    General Comments General comments (skin integrity, edema, etc.): B LE edema      Pertinent Vitals/Pain Pain Assessment: Faces Faces Pain Scale: Hurts whole lot Pain Location: right LE and left LE with R LE more painful than left Pain Descriptors / Indicators: Aching;Grimacing;Guarding;Discomfort Pain Intervention(s):  Limited activity within patient's tolerance;Monitored during session;Repositioned    Home Living                      Prior Function            PT Goals (current goals can now be found in the care plan section) Acute Rehab PT Goals Patient Stated Goal: to go home Progress towards PT goals: Progressing toward goals    Frequency    Min 2X/week      PT Plan Current plan remains appropriate    Co-evaluation              AM-PAC PT "6 Clicks" Mobility   Outcome Measure  Help needed turning from your back to  your side while in a flat bed without using bedrails?: Total Help needed moving from lying on your back to sitting on the side of a flat bed without using bedrails?: Total Help needed moving to and from a bed to a chair (including a wheelchair)?: Total Help needed standing up from a chair using your arms (e.g., wheelchair or bedside chair)?: Total Help needed to walk in hospital room?: Total Help needed climbing 3-5 steps with a railing? : Total 6 Click Score: 6    End of Session Equipment Utilized During Treatment: Gait belt;Oxygen Activity Tolerance: Patient limited by fatigue;Patient limited by pain Patient left: with call bell/phone within reach;in chair;with chair alarm set Nurse Communication: Mobility status;Need for lift equipment PT Visit Diagnosis: Muscle weakness (generalized) (M62.81);Pain Pain - Right/Left: Right Pain - part of body: Leg     Time: 2336-1224 PT Time Calculation (min) (ACUTE ONLY): 27 min  Charges:  $Therapeutic Exercise: 8-22 mins $Therapeutic Activity: 8-22 mins                     [place] Pager:  [phone]  Office:  [place] 04/03/2019, 10:09 AM

## 2019-04-03 NOTE — Progress Notes (Signed)
[facility] PROGRESS NOTE  Amy ShellingDeborah G Altemose [id number] DOB: 09/26/1956 DOA: 03/28/2019 PCP: Judy Pimpleower, Marne A, MD  A/P ID Aspiration pneumonia likely Started Unasyn 7/2 white count slightly better Appreciate speech therapy input follow recommendations for diet Sepsis 2/2 cellulitis-- not abcess Pain Better, appetite is better--labs over feet-no abscess on imaging ID recommends oral antibiotics and transition off of IV from bank cefepime Flagyl-->Doxy, Keflex Stopping IV fluids in a.m. Appreciate Wound nurse input 6/28--I lanced the blebs on her lower extremities 7/1--sterile fluid on visual exam Cardiorespiratory Acute hypoxic respiratory failure OSA on CPAPCOPD Chronic respiratory failure/oxygen 4 L at home Decompensated compensated chrHFpEF EF 60-65% 01/09/2019 Atrial fibrillation permanent [place] score >3/coumadin---------Resume Cardizem low-dose 30 mg tid daily given trigeminy on monitors Pvc Given one-time dose Lasix IV 7/2 with good effect, continue Lasix 40 twice daily IV-strict I's/O-monitor on stepdown and ask when he patient is asleep for RT to put patient on machine CXR done shows combination fluid, infection-see above discussion-cycle BNP, lactic acid, PCT --Coumadin per pharmacy-on hold secondary to malnutrition  Continue Symbicort 2 puff twice daily, albuterol/alternate in hospital-she has no wheeze at this time Renal and electrolytes AKI on admit secondary to sepsis, Lasix etc. Hypovolemic Hyponatremia from infection-resolved Mild hypokalemia-resolved Peak creatinine 2.25-see above-improved Endocrine DM TY 2 on oral meds Hypothyroidism Holding metformin 1000 twice daily at this time, holding glipizide XL SSI--100 range eating 20% of meals only Supplement synthroid 175 mcg every morning Heme Recent symptomatic anemia hospitalization 4/9-4/15 Stable currently Monitor trends in house GU Acute urinary retention,reatained ? 700 cc 6/28 Unclear if secondary to  infection/pain meds-keep foley--clamp in several days and recheck BMI 56 Life-threatening-recommend outpatient discussion regarding nutrition, feasibility GOP/gastric sleeve?  Synopsis 62 ? Afib CHad2Vasc2 score~4 /elliquis HFpEF ef 60-65% 4.10.20 COPD/Chr Resp Failure + OSA on 4lpm O2 DM ty II Bipolar BMI 55.4 hypothryoid Chr LE wounds  Last admit with UGIB Hb 5.6-FOBT was neg-int hemorrhoids noted-diverticulosis-hiatal hernia  Patient essentially came in with a bad cellulitis was treated for several days with IV antibiotics which were broadened-ID was consulted and they were narrowed however she is continued to have severe pain in the lower extremities On the afternoon of 7/2 she developed encephalopathy likely secondary to aspiration pneumonia which is slightly improved now, antibiotics were broadened again cultures were obtained She remains in stepdown in critical but stable condition at this time  Came to ED with Tmax 99.4-she wbc 20.8-bun/creat 56/2.2 up from baseline Sodium down 140--->127 CXR=L sided Atelectasis--Given Saline 1.8 L in ED   DVT coumadin   Code Status: full   Communication: fully updated husband--I had a long chat with her husband on 7/3 regarding overall planning and decline since admission He at first declines wishing to take or send the patient to a skilled facility however when I tell him she is max assist and does need a left he seems to understand that this may be harder for him to manage at home She has multiple severe comorbidities including pneumonia at this time wounds on her back and in her legs and I do not think she will do well at home and I explained this to him Disposition Plan: social work to look into APS case --it appears that patient will be here at least over the weekend and I have asked patient's family to reconsider her going to skilled facility first   Mahala MenghiniSamtani, MD  [facility] [employer] app [place] [email or web address] 7PM-7AM contact  night coverage as above 04/03/2019, 3:29 PM  LOS: 6 days    Antimicrobials:  Cefazolin + Vanco + Flagyl-->Doxy and Keflex  Interval history/Subjective:  Events overnight noted Patient is more coherent however not back at the baseline that she was at 3 to 4 days ago She continues to be sleepy throughout my exam She was able to be moved to the chair by therapy but is not really interactive today and is not eating much    Objective:  Vitals:  Vitals:   04/03/19 0902 04/03/19 1102  BP:  (!) 104/58  Pulse:  79  Resp:  (!) 21  Temp:  98 F (36.7 C)  SpO2: 96% 92%    Exam:  Mallampati 4, bridge on nose is ok-thick neck Cannot appreciate JVD, cannot appreciate carotids S1-S2 HSM ~ 3/6 sinus tach bigeminy and trigeminy throwing frequently Abdomen obese unable to screen for organomegaly no tenderness however MASD lower back   I have personally reviewed the following:   DATA K3.9-->3.3-->3.4->3.5-->3.6 BUNs/creatinine 56/2.2 on admission-->39/1.2-->27/1.0-->17/0.8-->15/0.8 bnp 775 lACTIC 1.0-->1.4 Pct 0.47 Hemoglobin down from 10.5-8.6-->8.9-->8.4--->8.1 WBC 21.8-->117.8 plt 421   Scheduled Meds: . diltiazem  30 mg Oral Q8H  . FLUoxetine  40 mg Oral Daily  . furosemide  40 mg Intravenous BID  . insulin aspart  0-5 Units Subcutaneous QHS  . insulin aspart  0-9 Units Subcutaneous TID WC  . levothyroxine  175 mcg Oral QAC breakfast  . mometasone-formoterol  2 puff Inhalation BID  . potassium chloride  40 mEq Oral Daily  . simvastatin  20 mg Oral q1800  . sodium chloride flush  3 mL Intravenous Q12H  . warfarin  2.5 mg Oral ONCE-1800  . Warfarin - Pharmacist Dosing Inpatient   Does not apply q1800   Continuous Infusions: . sodium chloride 50 mL/hr at 04/03/19 0620  . ampicillin-sulbactam (UNASYN) IV 3 g (04/03/19 1126)    Principal Problem:   Cellulitis of right leg Active Problems:   Hypothyroidism   Diabetes type 2, controlled (HCC)   Morbid obesity  (HCC)   Atrial fibrillation (HCC)   OSA (obstructive sleep apnea)   Acute on chronic respiratory failure with hypoxia (HCC)   LOS: 6 days

## 2019-04-03 NOTE — Evaluation (Signed)
Clinical/Bedside Swallow Evaluation Patient Details  Name: Amy Bryant MRN: [id number] Date of Birth: 25-May-1956  Today's Date: 04/03/2019 Time: SLP Start Time (ACUTE ONLY): 1250 SLP Stop Time (ACUTE ONLY): 1259 SLP Time Calculation (min) (ACUTE ONLY): 9 min  Past Medical History:  Past Medical History:  Diagnosis Date  . Atrial fibrillation ([place])    with cardioversion success  . CHF (congestive heart failure) ([place])   . COPD (chronic obstructive pulmonary disease) ([place])   . Cor pulmonale (HCC)    and obesity hypoventilation syndrome  . Diabetes mellitus without complication ([place])   . GERD (gastroesophageal reflux disease)   . Hypothyroid   . Morbid obesity ([place])   . OA (osteoarthritis)   . Pulmonary embolism (HCC)    hx of on coumadin  . Reactive airways dysfunction syndrome [facility])    Past Surgical History:  Past Surgical History:  Procedure Laterality Date  . BIOPSY  01/12/2019   Procedure: BIOPSY;  Surgeon: Jerene Bears, MD;  Location: [place];  Service: Gastroenterology;;  . COLONOSCOPY WITH PROPOFOL N/A 01/12/2019   Procedure: COLONOSCOPY WITH PROPOFOL;  Surgeon: Jerene Bears, MD;  Location: [place];  Service: Gastroenterology;  Laterality: N/A;  . ESOPHAGOGASTRODUODENOSCOPY (EGD) WITH PROPOFOL N/A 01/12/2019   Procedure: ESOPHAGOGASTRODUODENOSCOPY (EGD) WITH PROPOFOL;  Surgeon: Jerene Bears, MD;  Location: [facility] ENDOSCOPY;  Service: Gastroenterology;  Laterality: N/A;  . TONSILLECTOMY     HPI:  Amy Bryant is a 63 y/o female with medical history significant of GERD, atrial fibrillation on Coumadin, obesity hypoventilation syndrome with CPAP at nighttime, chronic respiratory failure with chronic 4 L of nasal cannula use, morbid obesity, hypothyroidism, chronic pressure ulcer wounds on bilateral posterior thighs presented with sepsis, cellulitis, afib.  Amy Bryant started to develop increased confusion and there was concern for aspiration per MD, therefore SLP was ordered.    Assessment / Plan / Recommendation Clinical Impression  Amy Bryant had a single delayed cough with no other overt signs of aspiration during PO trials. She does however have fluctuating alertness, which could put her at intermittent risk. Recommend continuing with current diet for now, but SLP f/u is warranted to evaluate for ongoing tolerance given potential for increased difficulty if mentation were to decline any further. Would also recommend set-up assist for meals to ensure that Amy Bryant is in the safest position and fully alert prior to initiating meals.  SLP Visit Diagnosis: Dysphagia, unspecified (R13.10)    Aspiration Risk  Mild aspiration risk    Diet Recommendation Regular;Thin liquid   Liquid Administration via: Cup;Straw Medication Administration: Whole meds with liquid Supervision: Patient able to self feed;Intermittent supervision to cue for compensatory strategies;Comment(set-up assist) Compensations: Slow rate;Small sips/bites Postural Changes: Seated upright at 90 degrees    Other  Recommendations Oral Care Recommendations: Oral care BID   Follow up Recommendations Skilled Nursing facility      Frequency and Duration min 2x/week  2 weeks       Prognosis Prognosis for Safe Diet Advancement: Good Barriers to Reach Goals: Cognitive deficits      Swallow Study   General HPI: Amy Bryant is a 63 y/o female with medical history significant of GERD, atrial fibrillation on Coumadin, obesity hypoventilation syndrome with CPAP at nighttime, chronic respiratory failure with chronic 4 L of nasal cannula use, morbid obesity, hypothyroidism, chronic pressure ulcer wounds on bilateral posterior thighs presented with sepsis, cellulitis, afib.  Amy Bryant started to develop increased confusion and there was concern for aspiration per MD, therefore SLP was ordered. Type of  Study: Bedside Swallow Evaluation Previous Swallow Assessment: none in chart Diet Prior to this Study: Regular;Thin liquids Temperature  Spikes Noted: Yes(100) Respiratory Status: Nasal cannula History of Recent Intubation: No Behavior/Cognition: Confused;Requires cueing;Other (Comment)(fluctuating alertness) Oral Cavity Assessment: (difficult to see) Oral Care Completed by SLP: No Oral Cavity - Dentition: (appear adequate - need better view ) Vision: Functional for self-feeding Self-Feeding Abilities: Able to feed self Patient Positioning: Upright in chair Baseline Vocal Quality: Normal Volitional Swallow: Able to elicit    Oral/Motor/Sensory Function Overall Oral Motor/Sensory Function: Within functional limits   Ice Chips Ice chips: Not tested   Thin Liquid Thin Liquid: Impaired Presentation: Cup;Self Fed;Straw Pharyngeal  Phase Impairments: Cough - Delayed    Nectar Thick Nectar Thick Liquid: Not tested   Honey Thick Honey Thick Liquid: Not tested   Puree Puree: Impaired Presentation: Self Fed;Spoon Pharyngeal Phase Impairments: Cough - Delayed   Solid     Solid: Impaired Presentation: Self Fed Pharyngeal Phase Impairments: Cough - Delayed      Virl AxeLaura P Anik Wesch 04/03/2019,1:33 PM  Ivar DrapeLaura Brodee Mauritz, M.A. CCC-SLP Acute Herbalistehabilitation Services Pager [phone] Office [phone]

## 2019-04-03 NOTE — Progress Notes (Addendum)
[place] for warfarin Indication: atrial fibrillation and pulmonary embolus  Allergies  Allergen Reactions  . Pseudoephedrine Other (See Comments)    REACTION: tightness in chest  . Augmentin [Amoxicillin-Pot Clavulanate] Itching and Rash    Did it involve swelling of the face/tongue/throat, SOB, or low BP? No Did it involve sudden or severe rash/hives, skin peeling, or any reaction on the inside of your mouth or nose? Yes Did you need to seek medical attention at a hospital or doctor's office? Yes When did it last happen?2019 If all above answers are "NO", may proceed with cephalosporin use.   . Latex Itching and Rash    Patient Measurements: Height: 5\' 5"  (165.1 cm) Weight: (!) 336 lb (152.4 kg) IBW/kg (Calculated) : 57  Vital Signs: Temp: 98.8 F (37.1 C) (07/03 0822) Temp Source: Oral (07/03 0822) BP: 103/46 (07/03 0822) Pulse Rate: 83 (07/03 0822)  Labs: Recent Labs    04/01/19 0256 04/02/19 0308 04/02/19 1829 04/03/19 0352  HGB 8.9* 8.4* 8.6* 8.1*  HCT 28.5* 27.4* 28.5* 26.8*  PLT 355 374 427* 421*  LABPROT 35.5* 35.1*  --  31.8*  INR 3.6* 3.6*  --  3.1*  CREATININE 1.02* 0.82 0.78 0.84    Estimated Creatinine Clearance: 103 mL/min (by C-G formula based on SCr of 0.84 mg/dL).   Medical History: Past Medical History:  Diagnosis Date  . Atrial fibrillation ([place])    with cardioversion success  . CHF (congestive heart failure) ([place])   . COPD (chronic obstructive pulmonary disease) ([place])   . Cor pulmonale (HCC)    and obesity hypoventilation syndrome  . Diabetes mellitus without complication ([place])   . GERD (gastroesophageal reflux disease)   . Hypothyroid   . Morbid obesity ([place])   . OA (osteoarthritis)   . Pulmonary embolism (HCC)    hx of on coumadin  . Reactive airways dysfunction syndrome (HCC)    Assessment: 74 yof presented to the ED with leg swelling. She is on chronic warfarin for history of afib  and PE. INR increased to 4 but dropped to 3.1 today after holding 6 doses since admission. H/H slowly trending down. No bleeding noted. No current DDI noted, status post Flagyl.  PTA dose 2.5 mg MWF, 5 mg TTSS  Goal of Therapy:  INR 2-3 Monitor platelets by anticoagulation protocol: Yes   Plan:  Start warfarin 2.5 mg today Daily INR  Berenice Bouton, PharmD PGY1 Pharmacy Resident Please check AMION for all [facility] Pharmacy numbers 04/03/2019

## 2019-04-03 NOTE — TOC Progression Note (Addendum)
Transition of Care [facility]) - Progression Note    Patient Details  Name: Amy Bryant MRN: [id number] Date of Birth: 1956-03-06  Transition of Care [facility]) CM/SW [place], [place] Phone Number: 04/03/2019, 2:33 PM  Clinical Narrative:     Patient's spouse, [name], and he has agreed to possible SNF placement for patient. He again, expressed she would not want to be in a nursing home. CSW explained it was short term rehab to help increase her independence and safety with mobility.    CSW explained the SNF process. Family had no preference for SNF. CSW will continue to follow and assist with the discharge planning. He expressed no other questions or concerns at this time.   Thurmond Butts, MSW, LCSWA Clinical Social Worker [phone]     Barriers to Discharge: Continued Medical Work up  Expected Discharge Plan and Services   In-house Referral: Clinical Social Work     Living arrangements for the past 2 months: Single Family Home Expected Discharge Date: 04/04/19                                     Social Determinants of Health (SDOH) Interventions    Readmission Risk Interventions No flowsheet data found.

## 2019-04-04 LAB — CBC WITH DIFFERENTIAL/PLATELET
Abs Immature Granulocytes: 1.01 10*3/uL — ABNORMAL HIGH (ref 0.00–0.07)
Basophils Absolute: 0 10*3/uL (ref 0.0–0.1)
Basophils Relative: 0 %
Eosinophils Absolute: 0.2 10*3/uL (ref 0.0–0.5)
Eosinophils Relative: 1 %
HCT: 27.3 % — ABNORMAL LOW (ref 36.0–46.0)
Hemoglobin: 8.3 g/dL — ABNORMAL LOW (ref 12.0–15.0)
Immature Granulocytes: 7 %
Lymphocytes Relative: 6 %
Lymphs Abs: 1 10*3/uL (ref 0.7–4.0)
MCH: 29.7 pg (ref 26.0–34.0)
MCHC: 30.4 g/dL (ref 30.0–36.0)
MCV: 97.8 fL (ref 80.0–100.0)
Monocytes Absolute: 0.5 10*3/uL (ref 0.1–1.0)
Monocytes Relative: 3 %
Neutro Abs: 12.3 10*3/uL — ABNORMAL HIGH (ref 1.7–7.7)
Neutrophils Relative %: 83 %
Platelets: 440 10*3/uL — ABNORMAL HIGH (ref 150–400)
RBC: 2.79 MIL/uL — ABNORMAL LOW (ref 3.87–5.11)
RDW: 16.3 % — ABNORMAL HIGH (ref 11.5–15.5)
WBC: 14.9 10*3/uL — ABNORMAL HIGH (ref 4.0–10.5)
nRBC: 0.2 % (ref 0.0–0.2)

## 2019-04-04 LAB — COMPREHENSIVE METABOLIC PANEL
ALT: 12 U/L (ref 0–44)
AST: 38 U/L (ref 15–41)
Albumin: 1.7 g/dL — ABNORMAL LOW (ref 3.5–5.0)
Alkaline Phosphatase: 145 U/L — ABNORMAL HIGH (ref 38–126)
Anion gap: 5 (ref 5–15)
BUN: 14 mg/dL (ref 8–23)
CO2: 29 mmol/L (ref 22–32)
Calcium: 9.8 mg/dL (ref 8.9–10.3)
Chloride: 104 mmol/L (ref 98–111)
Creatinine, Ser: 0.76 mg/dL (ref 0.44–1.00)
GFR calc Af Amer: >60 mL/min (ref >60)
GFR calc non Af Amer: >60 mL/min (ref >60)
Glucose, Bld: 94 mg/dL (ref 70–99)
Potassium: 4.1 mmol/L (ref 3.5–5.1)
Sodium: 138 mmol/L (ref 135–145)
Total Bilirubin: 0.8 mg/dL (ref 0.3–1.2)
Total Protein: 6 g/dL — ABNORMAL LOW (ref 6.5–8.1)

## 2019-04-04 LAB — GLUCOSE, CAPILLARY
Glucose-Capillary: 85 mg/dL (ref 70–99)
Glucose-Capillary: 102 mg/dL — ABNORMAL HIGH (ref 70–99)
Glucose-Capillary: 121 mg/dL — ABNORMAL HIGH (ref 70–99)

## 2019-04-04 LAB — PROTIME-INR
INR: 2.5 — ABNORMAL HIGH (ref 0.8–1.2)
Prothrombin Time: 26.9 seconds — ABNORMAL HIGH (ref 11.4–15.2)

## 2019-04-04 LAB — PROCALCITONIN: Procalcitonin: 0.42 ng/mL

## 2019-04-04 MED ORDER — WARFARIN SODIUM 5 MG PO TABS
5.0000 mg | ORAL_TABLET | Freq: Once | ORAL | Status: AC
  Administered 2019-04-04: 5 mg via ORAL
  Filled 2019-04-04: qty 1

## 2019-04-04 MED ORDER — POLYETHYLENE GLYCOL 3350 17 G PO PACK
17.0000 g | PACK | Freq: Every day | ORAL | Status: DC | PRN
  Administered 2019-04-04 – 2019-04-05 (×2): 17 g via ORAL
  Filled 2019-04-04 (×2): qty 1

## 2019-04-04 NOTE — Progress Notes (Signed)
[place] for warfarin Indication: atrial fibrillation and pulmonary embolus  Allergies  Allergen Reactions  . Pseudoephedrine Other (See Comments)    REACTION: tightness in chest  . Augmentin [Amoxicillin-Pot Clavulanate] Itching and Rash    Did it involve swelling of the face/tongue/throat, SOB, or low BP? No Did it involve sudden or severe rash/hives, skin peeling, or any reaction on the inside of your mouth or nose? Yes Did you need to seek medical attention at a hospital or doctor's office? Yes When did it last happen?2019 If all above answers are "NO", may proceed with cephalosporin use.   . Latex Itching and Rash    Patient Measurements: Height: 5\' 5"  (165.1 cm) Weight: (unable to weigh bed scale broke. Pt is unable to stand.) IBW/kg (Calculated) : 57  Vital Signs: Temp: 98 F (36.7 C) (07/04 1120) Temp Source: Oral (07/04 1120) BP: 115/59 (07/04 1120) Pulse Rate: 84 (07/04 1120)  Labs: Recent Labs    04/02/19 0308 04/02/19 1829 04/03/19 0352 04/04/19 0136  HGB 8.4* 8.6* 8.1* 8.3*  HCT 27.4* 28.5* 26.8* 27.3*  PLT 374 427* 421* 440*  LABPROT 35.1*  --  31.8* 26.9*  INR 3.6*  --  3.1* 2.5*  CREATININE 0.82 0.78 0.84 0.76    Estimated Creatinine Clearance: 108.2 mL/min (by C-G formula based on SCr of 0.76 mg/dL).   Medical History: Past Medical History:  Diagnosis Date  . Atrial fibrillation ([place])    with cardioversion success  . CHF (congestive heart failure) ([place])   . COPD (chronic obstructive pulmonary disease) ([place])   . Cor pulmonale (HCC)    and obesity hypoventilation syndrome  . Diabetes mellitus without complication ([place])   . GERD (gastroesophageal reflux disease)   . Hypothyroid   . Morbid obesity ([place])   . OA (osteoarthritis)   . Pulmonary embolism (HCC)    hx of on coumadin  . Reactive airways dysfunction syndrome (HCC)    Assessment: 56 yof presented to the ED with leg swelling. She is on  chronic warfarin for history of afib and PE. INR increased to 4 but dropped to 3.1 after holding warfarin  doses since admission. Warfarin restarted 7/3 INR today 2.5 - continues to drop.  H/H slowly trended down now low stable. No bleeding noted. No current DDI noted, status post Flagyl.  PTA dose 2.5 mg MWF, 5 mg TTSS  Goal of Therapy:  INR 2-3 Monitor platelets by anticoagulation protocol: Yes   Plan:  warfarin 5 mg today Daily INR  [name] Pharm.D. CPP, BCPS Clinical Pharmacist [phone] 04/04/2019 2:24 PM

## 2019-04-04 NOTE — Progress Notes (Addendum)
[facility] PROGRESS NOTE  Amy Bryant JIR:[id number] DOB: January 13, 1956 DOA: 03/28/2019 PCP: Abner Greenspan, MD  A/P ID Aspiration pneumonia  Started Unasyn 7/2 white count slightly better SLP recs Reg diet-supervision 7/3 Sepsis 2/2 cellulitis-- not abcess Pain Better, appetite is better--labs over feet-no abscess on imaging ID recommends oral antibiotics and transition off of IV from bank cefepime Flagyl-->Doxy, Keflex Stopping IV fluids  Appreciate Wound nurse input 6/28--I lanced the blebs on her lower extremities 7/1--sterile fluid on visual exam Dressings as per [facility] nurse Cardiorespiratory Acute hypoxic respiratory failure OSA on CPAPCOPD Chronic respiratory failure/oxygen 4 L at home Decompensated compensated chrHFpEF EF 60-65% 01/09/2019  Atrial fibrillation permanent [place] score >3/coumadin-----continuing Cardizem low-dose 30 mg tid daily given trigeminy on monitors Pvc Given one-time dose Lasix IV 7/2 with good effect, continue Lasix 40 twice daily-saline stopped IV-strict I's/O-monitor on stepdown and ask when he patient is asleep for RT to put patient on machine CXR done shows combination fluid, infection-improved overall --Coumadin per pharmacy Continue Symbicort 2 puff twice daily, albuterol/alternate in hospital-she has no wheeze at this time Renal and electrolytes AKI on admit secondary to sepsis, Lasix etc. Hypovolemic Hyponatremia from infection-resolved Mild hypokalemia-resolved Peak creatinine 2.25-see above-improved Endocrine DM TY 2 on oral meds Hypothyroidism Holding metformin 1000 twice daily at this time, holding glipizide XL SSI--100 range eating 75-100%% of meals  Supplement synthroid 175 mcg every morning Heme Recent symptomatic anemia hospitalization 4/9-4/15 Stable currently Monitor trends in house GU Acute urinary retention,reatained ? 700 cc 6/28 Unclear if secondary to infection/pain meds-keep foley BMI 56 Life-threatening-recommend  outpatient discussion regarding nutrition, feasibility GOP/gastric sleeve?  Synopsis 63 ? Afib CHad2Vasc2 score~4 /elliquis HFpEF ef 60-65% 4.10.20 COPD/Chr Resp Failure + OSA on 4lpm O2 DM ty II Bipolar BMI 55.4 hypothryoid Chr LE wounds  Last admit with UGIB Hb 5.6-FOBT was neg-int hemorrhoids noted-diverticulosis-hiatal hernia  Patient essentially came in with a bad cellulitis was treated for several days with IV antibiotics which were broadened-ID was consulted and they were narrowed however -continued to have severe pain in the lower extremities On the afternoon of 7/2 she developed encephalopathy likely secondary to aspiration pneumonia which is slightly improved now, antibiotics were broadened again cultures were obtained She remains in stepdown in critical but stable condition at this time  Came to ED with Tmax 99.4-she wbc 20.8-bun/creat 56/2.2 up from baseline Sodium down 140--->127 CXR=L sided Atelectasis--Given Saline 1.8 L in ED   DVT coumadin   Code Status: full   Communication: fully updated husband--I had a long chat with her husband on 7/3 regarding overall planning.  I am strongly recommending she go to SNF--recommended she discuss with her family  Disposition Plan: social work to look into APS case --it appears that patient will be here at least over the weekend and I have asked patient's family to reconsider her going to skilled facility first   Verlon Au, MD  [facility] [place] [email or web address] 7PM-7AM contact night coverage as above 04/04/2019, 11:23 AM  LOS: 7 days    Antimicrobials:  Cefazolin + Vanco + Flagyl-->Doxy and Keflex  Interval  Overall improved--much more coherent Feels better eating some--doesn't like food No cp, sputum,fever, chills Pain in LE continues to be signif To turn her to see wounds, needed 4+ people Objective:  Vitals:  Vitals:   04/04/19 0757 04/04/19 1120  BP: (!) 113/51 (!) 115/59  Pulse: 85 84  Resp:  19 19  Temp: 98 F (36.7 C) 98 F (36.7  C)  SpO2: 94% 97%   Exam: Mallampati 4, bridge on nose is ok-thick neck Cannot appreciate JVD, cannot appreciate carotids S1-S2 HSM ~ 3/6 sinus tach bigeminy at times Abdomen obese unable to screen for organomegaly no tenderness however MASD lower back  Wound exam---RLE significantly less red                  I have personally reviewed the following:   DATA K3.9-->3.3-->3.4->3.5-->3.6-->4.1 BUNs/creatinine 56/2.2 on admission-->39/1.2-->27/1.0-->17/0.8-->15/0.8-->14/0.76 bnp 775 lACTIC 1.0-->1.4 Pct 0.47-->0.42 Hemoglobin down from 10.5-8.6-->8.9-->8.4--->8.3 WBC 21.8-->17.8--->14.9  I/o -8.5 liters  Scheduled Meds: . diltiazem  30 mg Oral Q8H  . FLUoxetine  40 mg Oral Daily  . furosemide  40 mg Intravenous BID  . insulin aspart  0-5 Units Subcutaneous QHS  . insulin aspart  0-9 Units Subcutaneous TID WC  . levothyroxine  175 mcg Oral QAC breakfast  . mometasone-formoterol  2 puff Inhalation BID  . potassium chloride  40 mEq Oral Daily  . simvastatin  20 mg Oral q1800  . sodium chloride flush  3 mL Intravenous Q12H  . Warfarin - Pharmacist Dosing Inpatient   Does not apply q1800   Continuous Infusions: . ampicillin-sulbactam (UNASYN) IV 3 g (04/04/19 0555)    Principal Problem:   Cellulitis of right leg Active Problems:   Hypothyroidism   Diabetes type 2, controlled (HCC)   Morbid obesity (HCC)   Atrial fibrillation (HCC)   OSA (obstructive sleep apnea)   Acute on chronic respiratory failure with hypoxia (HCC)   LOS: 7 days

## 2019-04-04 NOTE — Plan of Care (Signed)
Slow process patient is improving with plan of care.

## 2019-04-05 LAB — COMPREHENSIVE METABOLIC PANEL
ALT: 15 U/L (ref 0–44)
AST: 36 U/L (ref 15–41)
Albumin: 1.7 g/dL — ABNORMAL LOW (ref 3.5–5.0)
Alkaline Phosphatase: 137 U/L — ABNORMAL HIGH (ref 38–126)
Anion gap: 7 (ref 5–15)
BUN: 11 mg/dL (ref 8–23)
CO2: 29 mmol/L (ref 22–32)
Calcium: 9.7 mg/dL (ref 8.9–10.3)
Chloride: 101 mmol/L (ref 98–111)
Creatinine, Ser: 0.78 mg/dL (ref 0.44–1.00)
GFR calc Af Amer: >60 mL/min (ref >60)
GFR calc non Af Amer: >60 mL/min (ref >60)
Glucose, Bld: 109 mg/dL — ABNORMAL HIGH (ref 70–99)
Potassium: 3.7 mmol/L (ref 3.5–5.1)
Sodium: 137 mmol/L (ref 135–145)
Total Bilirubin: 0.5 mg/dL (ref 0.3–1.2)
Total Protein: 6.4 g/dL — ABNORMAL LOW (ref 6.5–8.1)

## 2019-04-05 LAB — CBC WITH DIFFERENTIAL/PLATELET
Abs Immature Granulocytes: 0.55 10*3/uL — ABNORMAL HIGH (ref 0.00–0.07)
Basophils Absolute: 0 10*3/uL (ref 0.0–0.1)
Basophils Relative: 0 %
Eosinophils Absolute: 0.1 10*3/uL (ref 0.0–0.5)
Eosinophils Relative: 1 %
HCT: 29.1 % — ABNORMAL LOW (ref 36.0–46.0)
Hemoglobin: 8.8 g/dL — ABNORMAL LOW (ref 12.0–15.0)
Immature Granulocytes: 4 %
Lymphocytes Relative: 7 %
Lymphs Abs: 1.1 10*3/uL (ref 0.7–4.0)
MCH: 29.8 pg (ref 26.0–34.0)
MCHC: 30.2 g/dL (ref 30.0–36.0)
MCV: 98.6 fL (ref 80.0–100.0)
Monocytes Absolute: 0.5 10*3/uL (ref 0.1–1.0)
Monocytes Relative: 3 %
Neutro Abs: 12.1 10*3/uL — ABNORMAL HIGH (ref 1.7–7.7)
Neutrophils Relative %: 85 %
Platelets: 455 10*3/uL — ABNORMAL HIGH (ref 150–400)
RBC: 2.95 MIL/uL — ABNORMAL LOW (ref 3.87–5.11)
RDW: 16 % — ABNORMAL HIGH (ref 11.5–15.5)
WBC: 14.4 10*3/uL — ABNORMAL HIGH (ref 4.0–10.5)
nRBC: 0 % (ref 0.0–0.2)

## 2019-04-05 LAB — GLUCOSE, CAPILLARY
Glucose-Capillary: 95 mg/dL (ref 70–99)
Glucose-Capillary: 104 mg/dL — ABNORMAL HIGH (ref 70–99)
Glucose-Capillary: 120 mg/dL — ABNORMAL HIGH (ref 70–99)
Glucose-Capillary: 117 mg/dL — ABNORMAL HIGH (ref 70–99)

## 2019-04-05 LAB — PROCALCITONIN: Procalcitonin: 0.25 ng/mL

## 2019-04-05 LAB — PROTIME-INR
INR: 2.9 — ABNORMAL HIGH (ref 0.8–1.2)
Prothrombin Time: 29.6 seconds — ABNORMAL HIGH (ref 11.4–15.2)

## 2019-04-05 MED ORDER — WARFARIN SODIUM 2.5 MG PO TABS
2.5000 mg | ORAL_TABLET | Freq: Once | ORAL | Status: AC
  Administered 2019-04-05: 2.5 mg via ORAL
  Filled 2019-04-05: qty 1

## 2019-04-05 MED ORDER — DILTIAZEM HCL ER 60 MG PO CP12
60.0000 mg | ORAL_CAPSULE | Freq: Two times a day (BID) | ORAL | Status: DC
  Administered 2019-04-05 – 2019-04-11 (×13): 60 mg via ORAL
  Filled 2019-04-05 (×14): qty 1

## 2019-04-05 NOTE — Progress Notes (Signed)
Pt tolerated OOB in chair for 5 hours. Eating 50-75% meals. No bowel movement since 6/27. Miralax given 7/4 & 7/5. Will continue to watch for results. Wound care M.W.F. MD will coordinate with day RN to view wounds at dressing change.

## 2019-04-05 NOTE — Progress Notes (Signed)
[place] for warfarin Indication: atrial fibrillation and pulmonary embolus  Allergies  Allergen Reactions  . Pseudoephedrine Other (See Comments)    REACTION: tightness in chest  . Augmentin [Amoxicillin-Pot Clavulanate] Itching and Rash    Did it involve swelling of the face/tongue/throat, SOB, or low BP? No Did it involve sudden or severe rash/hives, skin peeling, or any reaction on the inside of your mouth or nose? Yes Did you need to seek medical attention at a hospital or doctor's office? Yes When did it last happen?2019 If all above answers are "NO", may proceed with cephalosporin use.   . Latex Itching and Rash    Patient Measurements: Height: 5\' 5"  (165.1 cm) Weight: (unable to weigh bed scale broke. Pt is unable to stand.) IBW/kg (Calculated) : 57  Vital Signs: Temp: 98.2 F (36.8 C) (07/05 1148) Temp Source: Oral (07/05 1148) BP: 135/69 (07/05 1148) Pulse Rate: 84 (07/05 1148)  Labs: Recent Labs    04/03/19 0352 04/04/19 0136 04/05/19 0304  HGB 8.1* 8.3* 8.8*  HCT 26.8* 27.3* 29.1*  PLT 421* 440* 455*  LABPROT 31.8* 26.9* 29.6*  INR 3.1* 2.5* 2.9*  CREATININE 0.84 0.76 0.78    Estimated Creatinine Clearance: 108.2 mL/min (by C-G formula based on SCr of 0.78 mg/dL).   Medical History: Past Medical History:  Diagnosis Date  . Atrial fibrillation ([place])    with cardioversion success  . CHF (congestive heart failure) ([place])   . COPD (chronic obstructive pulmonary disease) ([place])   . Cor pulmonale (HCC)    and obesity hypoventilation syndrome  . Diabetes mellitus without complication ([place])   . GERD (gastroesophageal reflux disease)   . Hypothyroid   . Morbid obesity ([place])   . OA (osteoarthritis)   . Pulmonary embolism (HCC)    hx of on coumadin  . Reactive airways dysfunction syndrome (HCC)    Assessment: 83 yof presented to the ED with leg swelling. She is on chronic warfarin for history of afib and PE.  INR increased to 4 but dropped to 3.1 after holding warfarin  doses since admission. Warfarin restarted 7/3 INR today 2.9 .  H/H slowly trended down now low stable. No bleeding noted. No current DDI noted, status post Flagyl.  PTA dose 2.5 mg MWF, 5 mg TTSS  Goal of Therapy:  INR 2-3 Monitor platelets by anticoagulation protocol: Yes   Plan:  warfarin 2.5 mg today -  Watch INR trend  - may need lower than previous home dose  Daily INR  [name] Pharm.D. CPP, BCPS Clinical Pharmacist [phone] 04/05/2019 2:03 PM

## 2019-04-05 NOTE — Plan of Care (Signed)
Patient is progressing to reach care plan goals.  

## 2019-04-05 NOTE — Progress Notes (Signed)
[facility] H[facility]ITAL[facility] PROGRESS NOTE  TRINIDI TOPPINS VPX:[id number] DOB: August 04, 1956 DOA: 03/28/2019 PCP: Abner Greenspan, MD  A/P ID Aspiration pneumonia  cont Unasyn 7/2--:I?l de-escalate in am to orals SLP recs Reg diet-supervision 7/3 Sepsis 2/2 cellulitis-- not abcess Pain Better, appetite is better--labs over feet-no abscess on imaging ID recommends oral antibiotics and transition off of IV from bank cefepime Flagyl-->Doxy, Keflex Stopping IV fluids  Appreciate Wound nurse input 6/28--I lanced the blebs on her lower extremities 7/1--sterile fluid on visual exam Dressings as per [facility] nurse Cardiorespiratory Acute hypoxic respiratory failure OSA on CPAPCOPD Chronic respiratory failure/oxygen 4 L at home Decompensated compensated chrHFpEF EF 60-65% 01/09/2019  Atrial fibrillation permanent [place] score >3/coumadin-----continuing Cardizem low-dose 30 mg tid daily given trigeminy on monitors Pvc---consolidate to Cardizem 60 bid CD 7/5  Given one-time dose Lasix IV 7/2 with good effect, continue Lasix 40 iv twice daily- IV-strict I's/O CXR done shows combination fluid, infection-improved overall --Coumadin per pharmacy Continue Symbicort 2 puff twice daily, albuterol/alternate in hospital-she has no wheeze at this time Renal and electrolytes AKI on admit secondary to sepsis, Lasix etc. Hypovolemic Hyponatremia from infection-resolved Mild hypokalemia-resolved Peak creatinine 2.25-see above-improved Endocrine DM TY 2 on oral meds Hypothyroidism Holding metformin 1000 twice daily at this time, holding glipizide XL SSI--95--109 range eating 75-100%% of meals  Supplement synthroid 175 mcg every morning Heme Recent symptomatic anemia hospitalization 4/9-4/15 Stable currently Monitor trends in house GU Acute urinary retention,reatained ? 700 cc 6/28 Unclear if secondary to infection/pain meds-clamp foley in am BMI 56 Life-threatening-recommend outpatient discussion regarding nutrition,  feasibility GOP/gastric sleeve?  Synopsis 63 ? Afib CHad2Vasc2 score~4 /elliquis HFpEF ef 60-65% 4.10.20 COPD/Chr Resp Failure + OSA on 4lpm O2 DM ty II Bipolar BMI 55.4 hypothryoid Chr LE wounds  Last admit with UGIB Hb 5.6-FOBT was neg-int hemorrhoids noted-diverticulosis-hiatal hernia  Patient essentially came in with a bad cellulitis was treated for several days with IV antibiotics which were broadened-ID was consulted and they were narrowed however -continued to have severe pain in the lower extremities On the afternoon of 7/2 she developed encephalopathy likely secondary to aspiration pneumonia which is slightly improved now, antibiotics were broadened again cultures were obtained She remains in stepdown but has improved gretly  Came to ED with Tmax 99.4-she wbc 20.8-bun/creat 56/2.2 up from baseline Sodium down 140--->127 CXR=L sided Atelectasis--Given Saline 1.8 L in ED   DVT coumadin   Code Status: full   Communication: fully updated husband--I had a long chat with her husband on 7/3 regarding overall planning.  I am strongly recommending she go to SNF--recommended she discuss with her family  Disposition Plan: social work to look into APS case --it appears that patient will be here at least over the weekend.   Verlon Au, MD  [facility] [email or web address] 7PM-7AM contact night coverage as above 04/05/2019, 2:19 PM  LOS: 8 days    Antimicrobials:  Cefazolin + Vanco + Flagyl-->Doxy and Keflex  Broadened antibiotics and started Unasyn as above  Interval  Much better overall No fever Sitting in chair Needed max assist and left to get her to the chair Wants to go home but understands possible need for skilled rehab No chest pain, cough, chills, fever, Reiger  Objective:  Vitals:  Vitals:   04/05/19 0742 04/05/19 1148  BP: 128/67 135/69  Pulse: 80 84  Resp: 20 15  Temp: 98 F (36.7 C) 98.2 F (36.8 C)  SpO2: 97% 94%    Exam: Mallampati 4,  bridge on nose is ok-thick neck Cannot appreciate JVD, cannot appreciate carotids S1-S2 HSM ~ 3/6 sinus tach bigeminy at times Abdomen obese unable to screen for organomegaly no tenderness however MASD lower back  Wound exam---RLE significantly less red  I have personally reviewed the following:   DATA K3.9-->3.3-->3.4->3.5-->3.6-->4.1-->3.7 BUNs/creatinine 56/2.2 on admission-->39/1.2-->27/1.0-->17/0.8-->15/0.8-->14/0.76--11/0.78 bnp 775 lACTIC 1.0-->1.4 Pct 0.47-->0.42-->0.25 Hemoglobin down from 10.5-8.6-->8.9-->8.4--->8.3 WBC 21.8-->17.8--->14.9-->14.4  I/o -8.5 liters  Scheduled Meds: . diltiazem  60 mg Oral Q12H  . FLUoxetine  40 mg Oral Daily  . furosemide  40 mg Intravenous BID  . insulin aspart  0-5 Units Subcutaneous QHS  . insulin aspart  0-9 Units Subcutaneous TID WC  . levothyroxine  175 mcg Oral QAC breakfast  . mometasone-formoterol  2 puff Inhalation BID  . potassium chloride  40 mEq Oral Daily  . simvastatin  20 mg Oral q1800  . sodium chloride flush  3 mL Intravenous Q12H  . warfarin  2.5 mg Oral ONCE-1800  . Warfarin - Pharmacist Dosing Inpatient   Does not apply q1800   Continuous Infusions: . ampicillin-sulbactam (UNASYN) IV 3 g (04/05/19 1230)    Principal Problem:   Cellulitis of right leg Active Problems:   Hypothyroidism   Diabetes type 2, controlled (HCC)   Morbid obesity (HCC)   Atrial fibrillation (HCC)   OSA (obstructive sleep apnea)   Acute on chronic respiratory failure with hypoxia (HCC)   LOS: 8 days

## 2019-04-06 LAB — CBC WITH DIFFERENTIAL/PLATELET
Abs Immature Granulocytes: 0.25 10*3/uL — ABNORMAL HIGH (ref 0.00–0.07)
Basophils Absolute: 0 10*3/uL (ref 0.0–0.1)
Basophils Relative: 0 %
Eosinophils Absolute: 0.2 10*3/uL (ref 0.0–0.5)
Eosinophils Relative: 2 %
HCT: 28.5 % — ABNORMAL LOW (ref 36.0–46.0)
Hemoglobin: 8.5 g/dL — ABNORMAL LOW (ref 12.0–15.0)
Immature Granulocytes: 3 %
Lymphocytes Relative: 9 %
Lymphs Abs: 0.9 10*3/uL (ref 0.7–4.0)
MCH: 29.5 pg (ref 26.0–34.0)
MCHC: 29.8 g/dL — ABNORMAL LOW (ref 30.0–36.0)
MCV: 99 fL (ref 80.0–100.0)
Monocytes Absolute: 0.4 10*3/uL (ref 0.1–1.0)
Monocytes Relative: 4 %
Neutro Abs: 8.1 10*3/uL — ABNORMAL HIGH (ref 1.7–7.7)
Neutrophils Relative %: 82 %
Platelets: 454 10*3/uL — ABNORMAL HIGH (ref 150–400)
RBC: 2.88 MIL/uL — ABNORMAL LOW (ref 3.87–5.11)
RDW: 15.9 % — ABNORMAL HIGH (ref 11.5–15.5)
WBC: 9.8 10*3/uL (ref 4.0–10.5)
nRBC: 0 % (ref 0.0–0.2)

## 2019-04-06 LAB — GLUCOSE, CAPILLARY
Glucose-Capillary: 101 mg/dL — ABNORMAL HIGH (ref 70–99)
Glucose-Capillary: 99 mg/dL (ref 70–99)
Glucose-Capillary: 93 mg/dL (ref 70–99)
Glucose-Capillary: 97 mg/dL (ref 70–99)
Glucose-Capillary: 120 mg/dL — ABNORMAL HIGH (ref 70–99)

## 2019-04-06 LAB — PROTIME-INR
INR: 3.5 — ABNORMAL HIGH (ref 0.8–1.2)
Prothrombin Time: 34.9 seconds — ABNORMAL HIGH (ref 11.4–15.2)

## 2019-04-06 LAB — COMPREHENSIVE METABOLIC PANEL
ALT: 16 U/L (ref 0–44)
AST: 39 U/L (ref 15–41)
Albumin: 1.7 g/dL — ABNORMAL LOW (ref 3.5–5.0)
Alkaline Phosphatase: 126 U/L (ref 38–126)
Anion gap: 7 (ref 5–15)
BUN: 12 mg/dL (ref 8–23)
CO2: 31 mmol/L (ref 22–32)
Calcium: 9.7 mg/dL (ref 8.9–10.3)
Chloride: 102 mmol/L (ref 98–111)
Creatinine, Ser: 0.8 mg/dL (ref 0.44–1.00)
GFR calc Af Amer: >60 mL/min (ref >60)
GFR calc non Af Amer: >60 mL/min (ref >60)
Glucose, Bld: 105 mg/dL — ABNORMAL HIGH (ref 70–99)
Potassium: 4.1 mmol/L (ref 3.5–5.1)
Sodium: 140 mmol/L (ref 135–145)
Total Bilirubin: 0.6 mg/dL (ref 0.3–1.2)
Total Protein: 6.3 g/dL — ABNORMAL LOW (ref 6.5–8.1)

## 2019-04-06 MED ORDER — SORBITOL 70 % SOLN
20.0000 mL | Freq: Every day | Status: DC
  Administered 2019-04-06: 20 mL via ORAL
  Filled 2019-04-06 (×5): qty 30

## 2019-04-06 MED ORDER — AMOXICILLIN-POT CLAVULANATE 875-125 MG PO TABS
1.0000 | ORAL_TABLET | Freq: Two times a day (BID) | ORAL | Status: DC
  Administered 2019-04-06 – 2019-04-11 (×11): 1 via ORAL
  Filled 2019-04-06 (×12): qty 1

## 2019-04-06 MED ORDER — SENNOSIDES-DOCUSATE SODIUM 8.6-50 MG PO TABS
1.0000 | ORAL_TABLET | Freq: Every day | ORAL | Status: DC
  Administered 2019-04-06 – 2019-04-09 (×4): 1 via ORAL
  Filled 2019-04-06 (×4): qty 1

## 2019-04-06 NOTE — Progress Notes (Signed)
[facility] PROGRESS NOTE  Amy Bryant FTD:[id number] DOB: [birth year] DOA: 03/28/2019 PCP: Abner Greenspan, MD  A/P ID Aspiration pneumonia  cont Unasyn 7/2--:I?l de-escalate in am to orals SLP recs Reg diet-supervision 7/3 Sepsis 2/2 cellulitis-- not abcess Pain Better, appetite is better-no abscess on imaging ID saw 7/2-rec's cefepime Flagyl-->Doxy, Keflex Stopping IV fluids  Appreciate Wound nurse input 6/28--I lanced the blebs on her lower extremities 7/1--sterile fluid on visual exam Dressings as per [place] nurse Cardiorespiratory Acute hypoxic respiratory failure OSA on CPAPCOPD Chronic respiratory failure/oxygen 4 L at home Decompensated compensated chrHFpEF EF 60-65% 01/09/2019  Atrial fibrillation permanent Mali score >3/coumadin-----continuing Cardizem--Pvc---consolidate to Cardizem 60 bid CD 7/5  Given one-time dose Lasix IV 7/2 with good effect, continue Lasix 40 iv twice daily- CXR done shows combination fluid, infection-improved overall --Coumadin per pharmacy Continue Symbicort 2 puff twice daily, albuterol/alternate in hospital-she has no wheeze at this time Renal and electrolytes AKI on admit secondary to sepsis, Lasix etc. Hypovolemic Hyponatremia from infection-resolved Mild hypokalemia-resolved Peak creatinine 2.25-see above-improved Endocrine DM TY 2 on oral meds Hypothyroidism Holding metformin 1000 twice daily at this time, holding glipizide XL SSI--93-105 range eating 75%  meals  Supplement synthroid 175 mcg every morning Heme Recent symptomatic anemia hospitalization 4/9-4/15 Stable currently Monitor trends in house GU Acute urinary retention,reatained ? 700 cc 6/28 Unclear if secondary to infection/pain meds-d/c foley 7/6 BMI 56 Life-threatening-recommend outpatient discussion regarding nutrition, feasibility GOP/gastric sleeve?  Synopsis 63 ? Afib CHad2Vasc2 score~4 /elliquis HFpEF ef 60-65% 4.10.20 COPD/Chr Resp Failure + OSA on 4lpm  O2 DM ty II Bipolar BMI 55.4 hypothryoid Chr LE wounds  Last admit with UGIB Hb 5.6-FOBT was neg-int hemorrhoids noted-diverticulosis-hiatal hernia  Patient essentially came in with a bad cellulitis was treated for several days with IV antibiotics which were broadened-ID was consulted and they were narrowed however -continued to have severe pain in the lower extremities On the afternoon of 7/2 she developed encephalopathy likely secondary to aspiration pneumonia which is slightly improved now, antibiotics were broadened again cultures were obtained She remains in stepdown but has improved gretly  Came to ED with Tmax 99.4-she wbc 20.8-bun/creat 56/2.2 up from baseline Sodium down 140--->127 CXR=L sided Atelectasis--Given Saline 1.8 L in ED   DVT coumadin   Code Status: full   Communication: fully updated husband--I had a long chat with her husband on 7/3 called and discussed with him again 7/6/202 Disposition Plan: unclear--nearing ability to d/c-patient family doesn't wish SNf placement despite my discussions--will arrange for [place], MD  [facility] [employer] app [place] [email or web address] 7PM-7AM contact night coverage as above 04/06/2019, 2:01 PM  LOS: 9 days    Antimicrobials:  Cefazolin + Vanco + Flagyl-->Doxy and Keflex  Broadened antibiotics and started Unasyn as above  Interval  Much better overall No fever Sitting in chair Needed max assist and left to get her to the chair Wants to go home but understands possible need for skilled rehab No chest pain, cough, chills, fever, Reiger  Objective:  Vitals:  Vitals:   04/06/19 0823 04/06/19 1106  BP:  126/65  Pulse:  72  Resp:  15  Temp:  97.8 F (36.6 C)  SpO2: 100% 98%   Exam: Mallampati 4, bridge on nose is ok-thick neck Cannot appreciate JVD, cannot appreciate carotids--telemetry--some PVC's S1-S2 HSM ~ 3/6  Abdomen obese unable to screen for organomegaly no tenderness however MASD  lower back  Wound exam---RLE significantly less red  I  have personally reviewed the following:   DATA K--3.9-->3.3-->3.4->3.5-->3.6-->4.1 BUNs/creatinine 56/2.2 on admission-->39/1.2-->27/1.0-->17/0.8-->15/0.8-->14/0.76--11/0.78--->12/0.8 bnp 775 lACTIC 1.0-->1.4 Pct 0.47-->0.42-->0.25 Hemoglobin down from 10.5-8.6-->8.9-->8.4--->8.3 WBC 21.8-->17.8--->14.9-->14.4--->9.8  I/o -11.96 liters  Scheduled Meds: . diltiazem  60 mg Oral Q12H  . FLUoxetine  40 mg Oral Daily  . furosemide  40 mg Intravenous BID  . insulin aspart  0-5 Units Subcutaneous QHS  . insulin aspart  0-9 Units Subcutaneous TID WC  . levothyroxine  175 mcg Oral QAC breakfast  . mometasone-formoterol  2 puff Inhalation BID  . potassium chloride  40 mEq Oral Daily  . senna-docusate  1 tablet Oral QHS  . simvastatin  20 mg Oral q1800  . sodium chloride flush  3 mL Intravenous Q12H  . sorbitol  20 mL Oral Daily  . Warfarin - Pharmacist Dosing Inpatient   Does not apply q1800   Continuous Infusions: . ampicillin-sulbactam (UNASYN) IV 3 g (04/06/19 1245)    Principal Problem:   Cellulitis of right leg Active Problems:   Hypothyroidism   Diabetes type 2, controlled (HCC)   Morbid obesity (HCC)   Atrial fibrillation (HCC)   OSA (obstructive sleep apnea)   Acute on chronic respiratory failure with hypoxia (HCC)   LOS: 9 days

## 2019-04-06 NOTE — Progress Notes (Signed)
[employer] for warfarin Indication: atrial fibrillation and pulmonary embolus  Allergies  Allergen Reactions  . Pseudoephedrine Other (See Comments)    REACTION: tightness in chest  . Augmentin [Amoxicillin-Pot Clavulanate] Itching and Rash    Did it involve swelling of the face/tongue/throat, SOB, or low BP? No Did it involve sudden or severe rash/hives, skin peeling, or any reaction on the inside of your mouth or nose? Yes Did you need to seek medical attention at a hospital or doctor's office? Yes When did it last happen?2019 If all above answers are "NO", may proceed with cephalosporin use.   . Latex Itching and Rash    Patient Measurements: Height: 5\' 5"  (165.1 cm) Weight: (unable to weigh bed scale broke. Pt is unable to stand.) IBW/kg (Calculated) : 57  Vital Signs: Temp: 97.8 F (36.6 C) (07/06 1106) Temp Source: Oral (07/06 1106) BP: 126/65 (07/06 1106) Pulse Rate: 72 (07/06 1106)  Labs: Recent Labs    04/04/19 0136 04/05/19 0304 04/06/19 0247  HGB 8.3* 8.8* 8.5*  HCT 27.3* 29.1* 28.5*  PLT 440* 455* 454*  LABPROT 26.9* 29.6* 34.9*  INR 2.5* 2.9* 3.5*  CREATININE 0.76 0.78 0.80    Estimated Creatinine Clearance: 108.2 mL/min (by C-G formula based on SCr of 0.8 mg/dL).   Medical History: Past Medical History:  Diagnosis Date  . Atrial fibrillation ([place])    with cardioversion success  . CHF (congestive heart failure) ([place])   . COPD (chronic obstructive pulmonary disease) ([place])   . Cor pulmonale (HCC)    and obesity hypoventilation syndrome  . Diabetes mellitus without complication ([place])   . GERD (gastroesophageal reflux disease)   . Hypothyroid   . Morbid obesity ([place])   . OA (osteoarthritis)   . Pulmonary embolism (HCC)    hx of on coumadin  . Reactive airways dysfunction syndrome (HCC)    Assessment: 67 yof presented to the ED with leg swelling. She is on chronic warfarin for history of afib and PE.  INR increased to 4 but dropped to 3.1 after holding warfarin  doses since admission. Warfarin restarted 7/3 INR today up to 3.5   H/H slowly trended down now low stable. No bleeding noted. No current DDI noted, status post Flagyl.  PTA dose 2.5 mg MWF, 5 mg TTSS  Goal of Therapy:  INR 2-3 Monitor platelets by anticoagulation protocol: Yes   Plan:  Hold warfarin today  Watch INR trend  - may need lower than previous home dose  Daily INR  [name] Pharm.D. CPP, BCPS Clinical Pharmacist [phone] 04/06/2019 12:55 PM

## 2019-04-06 NOTE — Plan of Care (Signed)

## 2019-04-06 NOTE — Plan of Care (Signed)
  Problem: Education: Goal: Knowledge of General Education information will improve Description: Including pain rating scale, medication(s)/side effects and non-pharmacologic comfort measures Outcome: Progressing   Problem: Clinical Measurements: Goal: Ability to maintain clinical measurements within normal limits will improve Outcome: Progressing Goal: Diagnostic test results will improve Outcome: Progressing Goal: Respiratory complications will improve Outcome: Progressing Goal: Cardiovascular complication will be avoided Outcome: Progressing   Problem: Nutrition: Goal: Adequate nutrition will be maintained Outcome: Progressing   Problem: Coping: Goal: Level of anxiety will decrease Outcome: Progressing   Problem: Elimination: Goal: Will not experience complications related to bowel motility Outcome: Progressing Goal: Will not experience complications related to urinary retention Outcome: Progressing   Problem: Pain Managment: Goal: General experience of comfort will improve Outcome: Progressing   Problem: Safety: Goal: Ability to remain free from injury will improve Outcome: Progressing   Problem: Skin Integrity: Goal: Risk for impaired skin integrity will decrease Outcome: Progressing

## 2019-04-06 NOTE — Progress Notes (Signed)
Patient informed RT that she spoke with MD about CPAP and she is not wearing it.  Patient in no distress at this time, talking clearly.

## 2019-04-06 NOTE — Care Management Important Message (Signed)
Important Message  Patient Details  Name: Amy Bryant MRN: [id number] Date of Birth: November 02, 1955   Medicare Important Message Given:  Yes     Memory Argue 04/06/2019, 1:48 PM

## 2019-04-06 NOTE — TOC Progression Note (Signed)
Transition of Care [facility]) - Progression Note    Patient Details  Name: Amy Bryant MRN: [id number] Date of Birth: Feb 11, 1956  Transition of Care [facility]) CM/SW [place], [place] Phone Number: 04/06/2019, 3:28 PM  Clinical Narrative:    Spoke with pt on room phone, pt had several questions regarding coverage under [facility] for SNF. We discussed pt has three offers, if she agrees to SNF she would need to select one, her [facility] authorization would be obtained by SNF and would cover 20 days, so long as the pt is medically needing to be there, we discussed sometimes folks do not get coverage for a full 20 days if medically they are safe to return home. We also discussed that without supplemental insurance after 20 days there would be an associated copay. Pt states understanding, declines to make decision between [facility] and SNF at this time and states she needs to ask her husband. Encouraged her to make a decision early enough to allow [place] time to either find [facility] or get insurance auth for her.    TOC team member will f/u with pt.      Barriers to Discharge: Continued Medical Work up  Expected Discharge Plan and Services In-house Referral: Clinical Social Work Living arrangements for the past 2 months: Single Family Home Expected Discharge Date: 04/04/19                 Social Determinants of Health (SDOH) Interventions    Readmission Risk Interventions No flowsheet data found.

## 2019-04-06 NOTE — Progress Notes (Signed)
Pt refusing BIPAP tonight.

## 2019-04-07 LAB — PROTIME-INR
INR: 3.3 — ABNORMAL HIGH (ref 0.8–1.2)
Prothrombin Time: 33.3 seconds — ABNORMAL HIGH (ref 11.4–15.2)

## 2019-04-07 LAB — GLUCOSE, CAPILLARY
Glucose-Capillary: 96 mg/dL (ref 70–99)
Glucose-Capillary: 112 mg/dL — ABNORMAL HIGH (ref 70–99)
Glucose-Capillary: 93 mg/dL (ref 70–99)
Glucose-Capillary: 107 mg/dL — ABNORMAL HIGH (ref 70–99)

## 2019-04-07 MED ORDER — OXYCODONE HCL 5 MG PO TABS
10.0000 mg | ORAL_TABLET | Freq: Once | ORAL | Status: AC
  Administered 2019-04-07: 10 mg via ORAL
  Filled 2019-04-07: qty 2

## 2019-04-07 MED ORDER — AMOXICILLIN-POT CLAVULANATE 875-125 MG PO TABS: 1.0000 | ORAL_TABLET | Freq: Two times a day (BID) | ORAL | 0 refills | Status: DC

## 2019-04-07 MED ORDER — WARFARIN SODIUM 1 MG PO TABS
1.5000 mg | ORAL_TABLET | Freq: Once | ORAL | Status: AC
  Administered 2019-04-07: 1.5 mg via ORAL
  Filled 2019-04-07: qty 1

## 2019-04-07 NOTE — Progress Notes (Signed)
[place] for warfarin Indication: atrial fibrillation and pulmonary embolus  Allergies  Allergen Reactions  . Pseudoephedrine Other (See Comments)    REACTION: tightness in chest  . Augmentin [Amoxicillin-Pot Clavulanate] Itching and Rash    Did it involve swelling of the face/tongue/throat, SOB, or low BP? No Did it involve sudden or severe rash/hives, skin peeling, or any reaction on the inside of your mouth or nose? Yes Did you need to seek medical attention at a hospital or doctor's office? Yes When did it last happen?2019 If all above answers are "NO", may proceed with cephalosporin use.   . Latex Itching and Rash    Patient Measurements: Height: 5\' 5"  (165.1 cm) Weight: (unable to weigh bed scale broke. Pt is unable to stand.) IBW/kg (Calculated) : 57  Vital Signs: Temp: 97.8 F (36.6 C) (07/07 0739) Temp Source: Oral (07/07 0739) BP: 116/61 (07/07 0906) Pulse Rate: 76 (07/07 0812)  Labs: Recent Labs    04/05/19 0304 04/06/19 0247 04/07/19 0231  HGB 8.8* 8.5*  --   HCT 29.1* 28.5*  --   PLT 455* 454*  --   LABPROT 29.6* 34.9* 33.3*  INR 2.9* 3.5* 3.3*  CREATININE 0.78 0.80  --     Estimated Creatinine Clearance: 108.2 mL/min (by C-G formula based on SCr of 0.8 mg/dL).   Medical History: Past Medical History:  Diagnosis Date  . Atrial fibrillation ([place])    with cardioversion success  . CHF (congestive heart failure) ([place])   . COPD (chronic obstructive pulmonary disease) ([place])   . Cor pulmonale (HCC)    and obesity hypoventilation syndrome  . Diabetes mellitus without complication ([place])   . GERD (gastroesophageal reflux disease)   . Hypothyroid   . Morbid obesity ([place])   . OA (osteoarthritis)   . Pulmonary embolism (HCC)    hx of on coumadin  . Reactive airways dysfunction syndrome (HCC)    Assessment: 13 yof presented to the ED with leg swelling. She is on chronic warfarin for history of afib and PE.  Warfarin restarted 7/3, after holding for a few days d/t to supratherapeutic INR, trended up to 3.5 held 7/6, INR 7/7 3.3. Restart 1.5 mg 7/7. H/H slowly trended down now low stable. No bleeding noted. No current DDI noted, status post Flagyl.  PTA dose 2.5 mg MWF, 5 mg TTSS  Goal of Therapy:  INR 2-3 Monitor platelets by anticoagulation protocol: Yes   Plan:  Warfarin 1.5 mg x 1 tonight Watch INR trend  - may need lower than previous home dose  Daily INR  Daniyah Fohl L. Devin Going, PharmD, [place] PGY1 Pharmacy Resident [employer]: [phone] 04/07/2019 9:34 AM

## 2019-04-07 NOTE — Progress Notes (Addendum)
Physical Therapy Treatment Patient Details Name: Amy ShellingDeborah G Bryant MRN: [id number] DOB: April 04, 1956 Today's Date: 04/07/2019    History of Present Illness Pt is a 63 y/o female with medical history significant of atrial fibrillation on Coumadin, obesity hypoventilation syndrome with CPAP at nighttime, chronic respiratory failure with chronic 4 L of nasal cannula use, morbid obesity, hypothyroidism, chronic pressure ulcer wounds on bilateral posterior thighs presented with sepsis, cellulitis, afib.      PT Comments    Pt admitted with above diagnosis. Pt currently with functional limitations due to balance and endurance deficits. Spoke with Dr. Mahala MenghiniSamtani regarding pt discharge possibly today as pt had been refusing SNF.  Upon arrival, pt had wet the bed as purewick leaked.  Informed pt that if she is going home, pt had to get OOB today and show PT she can mobilize.  Pt was able to come to EOB with mod assist.  Pt stood to RW with mod assist of 2 for safety, needed total assist to clean urine,  and pivoted to chair with RW with min assist and cues.  Once in chair, had discussion with pt regarding need for SNF as she would need to be able to have the stamina to get in and out of car and get into house.  Pt called husband and informed him she doesn't think she can come home today and that she does in fact need SNF.  This PT notified Dr. Mahala MenghiniSamtani that pt is agreeable to SNF.  Pt will benefit from skilled PT to increase their independence and safety with mobility to allow discharge to the venue listed below.     Follow Up Recommendations  SNF;Supervision/Assistance - 24 hour     Equipment Recommendations  If home, pt will need hospital bed and O2 (patient states she has a walker and wheelchair)   Recommendations for Other Services       Precautions / Restrictions Precautions Precautions: Fall Restrictions Weight Bearing Restrictions: No    Mobility  Bed Mobility Overal bed mobility: Needs  Assistance Bed Mobility: Rolling;Sidelying to Sit Rolling: +2 for physical assistance;Min assist Sidelying to sit: Mod assist;+2 for physical assistance       General bed mobility comments: Pt states she needed to use the purewick.  Encouraged to do so prior to getting OOB. After pt began urinating, bed was wet as purewick suction not hooked up properly.  Prior to entry to room, MD notified PT that pt was going home today.  Encouraged pt to get to EOB as she hasnt  stood with PT and has been being lifted each day.  Pt states she is going to see what she could do.  She was able to come to EOB with min to mod assist and incr time to scoot to EOB.   Transfers Overall transfer level: Needs assistance Equipment used: Rolling walker (2 wheeled) Transfers: Sit to/from [employer] CorporationStand;Stand Pivot Transfers Sit to Stand: Mod assist;Min assist;+2 physical assistance;From elevated surface Stand pivot transfers: Min assist;+2 safety/equipment       General transfer comment: Pt was able to stand to RW with mod assist and cues with pt pulling up on RW.  Once up, pt leanining onto RW quite a bit but was able to take pivotal steps to chair.  Uncontrolled descent into chair.  Pt rested and then stoood from chair again with mod assist to position better in center of chair.   Ambulation/Gait  Stairs             Wheelchair Mobility    Modified Rankin (Stroke Patients Only)       Balance Overall balance assessment: Needs assistance Sitting-balance support: No upper extremity supported;Feet supported Sitting balance-Leahy Scale: Fair Sitting balance - Comments: sat EOB 15 min with min guard assist.  static sitting only   Standing balance support: Bilateral upper extremity supported;During functional activity Standing balance-Leahy Scale: Poor Standing balance comment: pt relies on UEs for support.  LEans on RW for balance and needs external support for safety. Amy Bryant Arousal/Alertness: Awake/alert Behavior During Therapy: Flat affect Overall Cognitive Status: Impaired/Different from baseline Area of Impairment: Memory;Safety/judgement;Awareness;Problem solving;Following commands;Attention                   Current Attention Level: Sustained Memory: Decreased short-term memory Following Commands: Follows one step commands with increased time Safety/Judgement: Decreased awareness of safety;Decreased awareness of deficits Awareness: Emergent Problem Solving: Slow processing;Decreased initiation;Requires verbal cues;Difficulty sequencing;Requires tactile cues        Exercises General Exercises - Lower Extremity Ankle Circles/Pumps: AROM;Both;10 reps;Supine Quad Sets: AROM;Both;10 reps;Supine Heel Slides: AAROM;Both;5 reps;Supine    General Comments General comments (skin integrity, edema, etc.): Bil LE edema      Pertinent Vitals/Pain Pain Assessment: No/denies pain    Home Living                      Prior Function            PT Goals (current goals can now be found in the care plan section) Acute Rehab PT Goals Patient Stated Goal: to go home Progress towards PT goals: Progressing toward goals    Frequency    Min 2X/week      PT Plan Current plan remains appropriate    Co-evaluation              AM-PAC PT "6 Clicks" Mobility   Outcome Measure  Help needed turning from your back to your side while in a flat bed without using bedrails?: A Lot Help needed moving from lying on your back to sitting on the side of a flat bed without using bedrails?: A Lot Help needed moving to and from a bed to a chair (including a wheelchair)?: A Lot Help needed standing up from a chair using your arms (e.g., wheelchair or bedside chair)?: A Lot Help needed to walk in hospital room?: Total Help needed climbing 3-5 steps with a railing? : Total 6 Click Score: 10    End of Session  Equipment Utilized During Treatment: Gait belt;Oxygen Activity Tolerance: Patient limited by fatigue;Patient limited by pain Patient left: with call bell/phone within reach;in chair;with chair alarm set Nurse Communication: Mobility status;Need for lift equipment PT Visit Diagnosis: Muscle weakness (generalized) (M62.81);Pain Pain - Right/Left: Right Pain - part of body: Leg     Time: 1031-1056 PT Time Calculation (min) (ACUTE ONLY): 25 min  Charges:  $Therapeutic Activity: 23-37 mins                     [place] Pager:  [phone]  Office:  [place] 04/07/2019, 11:31 AM

## 2019-04-07 NOTE — Discharge Summary (Addendum)
Physician Discharge Summary  Amy ShellingDeborah G Bryant ZOX:[id number] DOB: 1956/04/06 DOA: 03/28/2019  PCP: Judy Pimpleower, Marne A, MD  Admit date: 03/28/2019 Discharge date: 04/07/2019  Time spent: 55 minutes  Recommendations for Outpatient Follow-up:  1. Patient should complete Augmentin in the outpatient setting for dual diagnosis of cellulitis, pneumonia 2. Going to SNF for further care 3. Needs Chem-12, TSH about 1 week from now, needs periodic CBC 4. Recommend repeat 2 view chest x-ray in about 3 weeks to see if pneumonia has cleared 5. Careful use in outpatient setting metformin-may need to use a different agent-resumed glipizide on discharge 6. Needs outpatient follow-up with gastroenterology as previously arranged for symptomatic anemia 7. Given young age and comorbidities please consider referral to a bariatric surgeon-her super obesity is her most life-threatening issue 8. Please refer to pulmonology for reinitiation of CPAP-she has not been wearing it because of bruise to bridge of nose which may need to be worked up  Discharge Diagnoses:  Principal Problem:   Cellulitis of right leg Active Problems:   Hypothyroidism   Diabetes type 2, controlled (HCC)   Morbid obesity (HCC)   Atrial fibrillation (HCC)   OSA (obstructive sleep apnea)   Acute on chronic respiratory failure with hypoxia [facility]HCC)   Discharge Condition: Guarded  Diet recommendation: Heart healthy low-salt  Filed Weights   04/01/19 0404 04/02/19 0310 04/03/19 0500  Weight: (!) 152.9 kg (!) 155.6 kg (!) 152.4 kg    History of present illness:  2962 ? Afib CHad2Vasc2 score~4 /elliquis HFpEF ef 60-65% 4.10.20 COPD/Chr Resp Failure + OSA on 4lpm O2 DM ty II Bipolar BMI 55.4 hypothryoid Chr LE wounds  Last admit with UGIB Hb 5.6-FOBT was neg-int hemorrhoids noted-diverticulosis-hiatal hernia  Came to ED with Tmax 99.4-she wbc 20.8-bun/creat 56/2.2 up from baseline Sodium down 140--->127 CXR=L sided Atelectasis--Given  Saline 1.8 L in ED  Found to have a bad cellulitis was treated for several days with IV antibiotics which were broadened-ID was consulted and they were narrowed however -continued to have severe pain in the lower extremities On the afternoon of 7/2 she developed encephalopathy likely secondary to aspiration pneumonia which is slightly improved now, antibiotics were broadened again cultures were obtained She remains in stepdown but has improved gretly    Hospital Course:  ID Aspiration pneumonia  Started on Unasyn 7/2-de-escalated to Augmentin which she will continue on complete treatment for SLP recs Reg diet-supervision 7/3 Sepsis 2/2 cellulitis-- not abcess Pain Better, appetite is better-no abscess on imaging ID saw 7/2-rec's cefepime Flagyl-->Doxy, Keflex-this was consolidated to Augmentin as above Stopping IV fluids  Appreciate Wound nurse input 6/28--I lanced the blebs on her lower extremities 7/1--sterile fluid on visual exam Dressings as per WOC nurse Cardiorespiratory Acute hypoxic respiratory failure OSA on CPAPCOPD Chronic respiratory failure/oxygen 4 L at home Decompensated compensated chrHFpEF EF 60-65% 01/09/2019  Atrial fibrillation permanent [place] score >3/coumadin-----continuing Cardizem--Pvc---consolidate to Cardizem 60 bid CD 7/5 and on discharge patient was placed on her home dose of Cardizem 120 CD Given one-time dose Lasix IV 7/2 with good effect, continue Lasix 40 iv twice daily--de-escalate to 80 twice daily with oral dosing on discharge This admission patient was -15 L of fluid Will need 2 view x-ray on follow-up with PCP --Coumadin per pharmacy Continue Symbicort 2 puff twice daily, albuterol/alternate in hospital-she has no wheeze at this time Renal and electrolytes AKI on admit secondary to sepsis, Lasix etc. Hypovolemic Hyponatremia from infection-resolved Mild hypokalemia-resolved Peak creatinine 2.25-see above-improved Endocrine DM TY 2 on  oral  meds Hypothyroidism Resumed on discharge glipizide XL Suggest cautious reinitiation of metformin because she came in with mild azotemia on admission Supplement synthroid 175 mcg every morning-needs TSH within a week or 2 when she is stable Heme Recent symptomatic anemia hospitalization 4/9-4/15 Hemoglobin stabilized in the 8 range will need labs in a week GU Acute urinary retention,reatained ? 700 cc 6/28 Needed to have Foley catheter which was discontinued on 7/6 BMI 56 Life-threatening-recommend outpatient discussion regarding nutrition, feasibility GOP/gastric sleeve?    Consultations:  Infectious disease regarding lower extremity severe cellulitis  Discharge Exam: Vitals:   04/07/19 0812 04/07/19 0906  BP:  116/61  Pulse: 76   Resp: 18   Temp:    SpO2: 99%     Mallampati 4, bridge on nose is ok-thick neck Cannot appreciate JVD, cannot appreciate carotids--telemetry--some PVC's S1-S2 HSM ~ 3/6  Abdomen obese unable to screen for organomegaly no tenderness however I reviewed the wounds on her lower extremities the day prior to discharge which is significantly less red less swollen she has less moisture associated dermatitis under the folds of her pannus her back is less wet and overall she has made great improvements with max nursing's care    Discharge Instructions   Discharge Instructions    Diet - low sodium heart healthy   Complete by: As directed    For home use only DME Hospital bed   Complete by: As directed    Length of Need: Lifetime   The above medical condition requires: Patient requires the ability to reposition frequently   Head must be elevated greater than: 45 degrees   Bed type: Heavy-duty, semi-electric (for patients >350 lbs.)   Hoyer Lift: Yes   Trapeze Bar: Yes   Support Surface: Gel Overlay   Increase activity slowly   Complete by: As directed      Allergies as of 04/07/2019      Reactions   Pseudoephedrine Other (See Comments)    REACTION: tightness in chest   Augmentin [amoxicillin-pot Clavulanate] Itching, Rash   Did it involve swelling of the face/tongue/throat, SOB, or low BP? No Did it involve sudden or severe rash/hives, skin peeling, or any reaction on the inside of your mouth or nose? Yes Did you need to seek medical attention at a hospital or doctor's office? Yes When did it last happen?2019 If all above answers are "NO", may proceed with cephalosporin use.   Latex Itching, Rash      Medication List    STOP taking these medications   diclofenac 75 MG EC tablet Commonly known as: VOLTAREN   diltiazem 120 MG 24 hr capsule Commonly known as: TIAZAC   traMADol 50 MG tablet Commonly known as: ULTRAM     TAKE these medications   albuterol (2.5 MG/3ML) 0.083% nebulizer solution Commonly known as: PROVENTIL INHALE 1 VIAL VIA NEBULIZER EVERY 4 HOURS AS NEEDED FOR WHEEZING   ALPRAZolam 0.5 MG tablet Commonly known as: Xanax Take 1 tablet (0.5 mg total) by mouth 2 (two) times daily as needed for anxiety (panic attack).   amoxicillin-clavulanate 875-125 MG tablet Commonly known as: AUGMENTIN Take 1 tablet by mouth every 12 (twelve) hours for 6 days.   budesonide-formoterol 160-4.5 MCG/ACT inhaler Commonly known as: Symbicort INHALE 2 PUFFS INTO LUNGS TWICE A DAY What changed:   how much to take  how to take this  when to take this  additional instructions   cholecalciferol 1000 units tablet Commonly known as: VITAMIN D Take  4,000 Units by mouth daily.   ciclopirox 0.77 % cream Commonly known as: LOPROX APPLY TO AFFECTED AREA TWICE A DAY AS NEEDED What changed:   how much to take  how to take this  when to take this  reasons to take this  additional instructions   diltiazem 120 MG 24 hr capsule Commonly known as: Cartia XT Take 1 capsule (120 mg total) by mouth daily.   Elocon 0.1 % cream Generic drug: mometasone Apply 1 application topically See admin instructions.  Tiny amount to each ear canal twice weekly as needed.   FLUoxetine 40 MG capsule Commonly known as: PROZAC Take 1 capsule (40 mg total) by mouth daily.   furosemide 80 MG tablet Commonly known as: LASIX TAKE 1 TABLET (80 MG TOTAL) BY MOUTH 2 (TWO) TIMES DAILY.   glipiZIDE 5 MG 24 hr tablet Commonly known as: GLUCOTROL XL Take 1 tablet (5 mg total) by mouth daily with breakfast.   glucose blood test strip Test blood sugar once daily and as needed for DM 250.0   iron polysaccharides 150 MG capsule Commonly known as: Nu-Iron Take 1 capsule (150 mg total) by mouth 2 (two) times daily.   levothyroxine 175 MCG tablet Commonly known as: SYNTHROID Take 1 tablet (175 mcg total) by mouth daily before breakfast.   metFORMIN 1000 MG tablet Commonly known as: GLUCOPHAGE TAKE 1 TABLET BY MOUTH TWICE DAILY WITH A MEAL What changed: See the new instructions.   mupirocin ointment 2 % Commonly known as: BACTROBAN APPLY TO AFFECTED AREA TWICE A DAY What changed:   how much to take  how to take this  when to take this  reasons to take this  additional instructions   onetouch ultrasoft lancets Test blood sugar once daily and as needed for DM 250.0   pantoprazole 40 MG tablet Commonly known as: PROTONIX Take 1 tablet (40 mg total) by mouth daily at 6 (six) AM.   potassium chloride SA 20 MEQ tablet Commonly known as: Klor-Con M20 TAKE 2 TABLETS BY MOUTH EVERY DAY What changed:   how much to take  how to take this  when to take this  additional instructions   simvastatin 20 MG tablet Commonly known as: ZOCOR TAKE 1 TABLET BY MOUTH EVERYDAY AT BEDTIME What changed: See the new instructions.   warfarin 5 MG tablet Commonly known as: COUMADIN Take as directed. If you are unsure how to take this medication, talk to your nurse or doctor. Original instructions: Take 1-1.5 tablets (5-7.5 mg total) by mouth daily at 6 PM. Take 1.5 tablets (7.5mg ) today, then 1 tablet (5mg )  daily then AS DIRECTED BY THE ANTICOAGULATION CLINIC. What changed:   how much to take  when to take this  additional instructions            Durable Medical Equipment  (From admission, onward)         Start     Ordered   04/07/19 1047  DME Oxygen  Once    Question Answer Comment  Length of Need Lifetime   Mode or (Route) Nasal cannula   Liters per Minute 2   Frequency Continuous (stationary and portable oxygen unit needed)   Oxygen delivery system Gas      04/07/19 1047   04/07/19 0000  For home use only DME Hospital bed    Question Answer Comment  Length of Need Lifetime   The above medical condition requires: Patient requires the ability to reposition frequently  Head must be elevated greater than: 45 degrees   Bed type Heavy-duty, semi-electric (for patients >350 lbs.)   Psychologist, counselling Yes   Support Surface: Gel Overlay      04/07/19 1047         Allergies  Allergen Reactions  . Pseudoephedrine Other (See Comments)    REACTION: tightness in chest  . Augmentin [Amoxicillin-Pot Clavulanate] Itching and Rash    Did it involve swelling of the face/tongue/throat, SOB, or low BP? No Did it involve sudden or severe rash/hives, skin peeling, or any reaction on the inside of your mouth or nose? Yes Did you need to seek medical attention at a hospital or doctor's office? Yes When did it last happen?2019 If all above answers are "NO", may proceed with cephalosporin use.   . Latex Itching and Rash      The results of significant diagnostics from this hospitalization (including imaging, microbiology, ancillary and laboratory) are listed below for reference.    Significant Diagnostic Studies: Ct Foot Right Wo Contrast  Result Date: 03/30/2019 CLINICAL DATA:  Right foot cellulitis.  Evaluate for abscess. EXAM: CT OF THE RIGHT FOOT WITHOUT CONTRAST TECHNIQUE: Multidetector CT imaging of the right foot was performed according to the standard  protocol. Multiplanar CT image reconstructions were also generated. COMPARISON:  Right foot x-rays from same day. FINDINGS: Bones/Joint/Cartilage No acute fracture or dislocation. No cortical destruction or osteolysis. 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 Mild midfoot and tibiotalar osteoarthritis. Large well corticated ossific density dorsal to the talar head could be degenerative root due to old trauma. No joint effusion. Osteopenia. Small plantar enthesophyte. Ligaments Suboptimally assessed by CT. Muscles and Tendons Grossly intact.  Severe fatty atrophy of the intrinsic foot muscles. Soft tissues Diffuse soft tissue swelling and skin thickening. Large skin blister involving the medial hindfoot. Additional skin blisters involving the dorsal forefoot and great toe. No fluid collection. IMPRESSION: 1. Diffuse soft tissue swelling and skin thickening of the foot, consistent with reported history of cellulitis. Multiple skin blisters. No abscess. 2.  No acute osseous abnormality. 3. 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 Electronically Signed   By: Obie Dredge M.D.   On: 03/30/2019 17:13   Dg Chest Port 1 View  Result Date: 04/03/2019 CLINICAL DATA:  Aspiration. EXAM: PORTABLE CHEST 1 VIEW COMPARISON:  Chest x-ray from yesterday. FINDINGS: The patient is slightly rotated to the right. Stable cardiomediastinal silhouette. Improved pulmonary vascular congestion. No focal consolidation, pleural effusion, or pneumothorax. No acute osseous abnormality. IMPRESSION: 1. Improved pulmonary vascular congestion. Electronically Signed   By: Obie Dredge M.D.   On: 04/03/2019 06:41   Dg Chest Port 1 View  Result Date: 04/02/2019 CLINICAL DATA:  63 year old female with right foot cellulitis. EXAM: PORTABLE CHEST 1 VIEW COMPARISON:  Portable chest 03/28/2019 and earlier. FINDINGS: Portable AP semi upright view at 1800 hours. Lower lung volumes. Stable cardiac size at the upper limits of normal.  Other mediastinal contours are within normal limits. Visualized tracheal air column is within normal limits. No pneumothorax, pleural effusion or confluent pulmonary opacity. Increased pulmonary vascularity, no overt edema. Bulky humeral head spurring. IMPRESSION: Lower lung volumes with increased pulmonary vascularity but no overt edema. Electronically Signed   By: Odessa Fleming M.D.   On: 04/02/2019 18:26   Dg Chest Portable 1 View  Result Date: 03/28/2019 CLINICAL DATA:  Lower extremity edema EXAM: PORTABLE CHEST 1 VIEW COMPARISON:  October 04, 2009 FINDINGS: There is mild  cardiomegaly with pulmonary vascularity within normal limits. There is atelectatic change in the left base. There is no frank consolidation or edema. There is aortic atherosclerosis. No adenopathy. No bone lesions. IMPRESSION: Mild cardiomegaly. Atelectasis left base without edema or consolidation. Aortic Atherosclerosis (ICD10-I70.0). Electronically Signed   By: Bretta Bang III M.D.   On: 03/28/2019 14:07   Dg Foot Complete Right  Result Date: 03/30/2019 CLINICAL DATA:  Foot infection pain for 2-3 days. EXAM: RIGHT FOOT COMPLETE - 3+ VIEW COMPARISON:  None. FINDINGS: Osteopenia. Hallux valgus with uncovering of the medial first metatarsal head and advanced degenerative changes at the first metatarsophalangeal joint. Soft tissue wound is seen medial to the first metatarsal head/neck. No definite osseous erosion to indicate osteomyelitis. Extensive soft tissue swelling throughout the foot. IMPRESSION: 1. No definitive evidence of osteomyelitis. 2. Severe hallux valgus with advanced first metatarsophalangeal joint osteoarthritis. 3. Osteopenia. Electronically Signed   By: Leanna Battles M.D.   On: 03/30/2019 14:10   Ct Renal Stone Study  Result Date: 03/28/2019 CLINICAL DATA:  Left-sided flank pain. EXAM: CT ABDOMEN AND PELVIS WITHOUT CONTRAST TECHNIQUE: Multidetector CT imaging of the abdomen and pelvis was performed following the  standard protocol without IV contrast. COMPARISON:  None. FINDINGS: Lower chest: There is atelectasis in the lingula. There is no visualized pneumothorax at the left lung base. No significant pleural effusion. Hepatobiliary: There is hepatomegaly with hepatic steatosis. There is cholelithiasis without secondary signs of acute cholecystitis. Pancreas: Unremarkable. No pancreatic ductal dilatation or surrounding inflammatory changes. Spleen: The spleen is borderline enlarged measuring approximately 13.1 cm craniocaudad. Adrenals/Urinary Tract: There is a small right-sided adrenal myelolipoma measuring approximately 1.6 cm. There is a punctate nonobstructing 1-2 mm stone in the lower pole of the right kidney. There is a nonobstructing 6 mm stone in lower pole of the left kidney. There is no hydronephrosis. The bladder is unremarkable but is somewhat distended. Stomach/Bowel: There is sigmoid diverticulosis without CT evidence of diverticulitis. There is no CT evidence for acute appendicitis or a small bowel obstruction. Vascular/Lymphatic: Atherosclerotic changes are noted of the abdominal aorta without evidence of an abdominal aortic aneurysm. There is a possible enlarged left periaortic lymph node measuring approximately 1.4 cm. There are additional mildly enlarged retroperitoneal lymph nodes. There is a 1.7 cm lymph node along the right external iliac chain. There is a 1.7 cm lymph node along the right pelvic sidewall. There are enlarged right inguinal lymph nodes. Reproductive: Uterus and bilateral adnexa are unremarkable. Other: No abdominal wall hernia or abnormality. No abdominopelvic ascites. Musculoskeletal: There are multilevel degenerative changes throughout the visualized thoracolumbar spine. IMPRESSION: 1. No acute abnormality. No finding to explain the patient's left flank pain. 2. Bilateral nephrolithiasis.  No hydronephrosis. 3. There is cholelithiasis without secondary signs of acute cholecystitis. 4.  Hepatomegaly with hepatic steatosis. 5. Borderline splenomegaly. 6. Enlarged retroperitoneal, pelvic, and inguinal lymph nodes. This could be secondary to a lower extremity infectious process and should be correlated clinically. 7.  Aortic Atherosclerosis (ICD10-I70.0). Electronically Signed   By: Katherine Mantle M.D.   On: 03/28/2019 20:15    Microbiology: Recent Results (from the past 240 hour(s))  Blood Culture (routine x 2)     Status: None   Collection Time: 03/28/19  1:55 PM   Specimen: BLOOD  Result Value Ref Range Status   Specimen Description BLOOD SITE NOT SPECIFIED  Final   Special Requests   Final    BOTTLES DRAWN AEROBIC AND ANAEROBIC Blood Culture results may not be  optimal due to an inadequate volume of blood received in culture bottles   Culture   Final    NO GROWTH 5 DAYS Performed at [facility], [place] [address] [place], [place] [address]    Report Status 04/02/2019 FINAL  Final  Blood Culture (routine x 2)     Status: None   Collection Time: 03/28/19  2:36 PM   Specimen: BLOOD  Result Value Ref Range Status   Specimen Description BLOOD LEFT ANTECUBITAL  Final   Special Requests   Final    BOTTLES DRAWN AEROBIC AND ANAEROBIC Blood Culture adequate volume   Culture   Final    NO GROWTH 5 DAYS Performed at [facility], [place] [address], [place],  [address]    Report Status 04/02/2019 FINAL  Final  SARS Coronavirus 2 (CEPHEID - Performed in [place] hospital lab), Hosp Order     Status: None   Collection Time: 03/28/19  3:32 PM   Specimen: Nasopharyngeal Swab  Result Value Ref Range Status   SARS Coronavirus 2 NEGATIVE NEGATIVE Final    Comment: (NOTE) If result is NEGATIVE SARS-CoV-2 target nucleic acids are NOT DETECTED. The SARS-CoV-2 RNA is generally detectable in upper and lower  respiratory specimens during the acute phase of infection. The lowest  concentration of SARS-CoV-2 viral copies this assay can detect is 250  copies / mL.  A negative result does not preclude SARS-CoV-2 infection  and should not be used as the sole basis for treatment or other  patient management decisions.  A negative result may occur with  improper specimen collection / handling, submission of specimen other  than nasopharyngeal swab, presence of viral mutation(s) within the  areas targeted by this assay, and inadequate number of viral copies  (<250 copies / mL). A negative result must be combined with clinical  observations, patient history, and epidemiological information. If result is POSITIVE SARS-CoV-2 target nucleic acids are DETECTED. The SARS-CoV-2 RNA is generally detectable in upper and lower  respiratory specimens dur ing the acute phase of infection.  Positive  results are indicative of active infection with SARS-CoV-2.  Clinical  correlation with patient history and other diagnostic information is  necessary to determine patient infection status.  Positive results do  not rule out bacterial infection or co-infection with other viruses. If result is PRESUMPTIVE POSTIVE SARS-CoV-2 nucleic acids MAY BE PRESENT.   A presumptive positive result was obtained on the submitted specimen  and confirmed on repeat testing.  While 2019 novel coronavirus  (SARS-CoV-2) nucleic acids may be present in the submitted sample  additional confirmatory testing may be necessary for epidemiological  and / or clinical management purposes  to differentiate between  SARS-CoV-2 and other Sarbecovirus currently known to infect humans.  If clinically indicated additional testing with an alternate test  methodology [phone]) is advised. The SARS-CoV-2 RNA is generally  detectable in upper and lower respiratory sp ecimens during the acute  phase of infection. The expected result is Negative. Fact Sheet for Patients:  StrictlyIdeas.no Fact Sheet for Healthcare Providers: BankingDealers.co.za This test is not  yet approved or cleared by the [place] FDA and has been authorized for detection and/or diagnosis of SARS-CoV-2 by FDA under an Emergency Use Authorization (EUA).  This EUA will remain in effect (meaning this test can be used) for the duration of the COVID-19 declaration under Section 564(b)(1) of the Act, 21 U.S.C. section 360bbb-3(b)(1), unless the authorization is terminated or revoked sooner. Performed  at [facility], [address] [place], [place] [address]   MRSA PCR Screening     Status: None   Collection Time: 03/28/19  5:44 PM   Specimen: Nasal Mucosa; Nasopharyngeal  Result Value Ref Range Status   MRSA by PCR NEGATIVE NEGATIVE Final    Comment:        The GeneXpert MRSA Assay (FDA approved for NASAL specimens only), is one component of a comprehensive MRSA colonization surveillance program. It is not intended to diagnose MRSA infection nor to guide or monitor treatment for MRSA infections. Performed at [facility], [address], [place], [place] [address]   Urine Culture     Status: Abnormal   Collection Time: 03/29/19  2:40 PM   Specimen: Urine, Random  Result Value Ref Range Status   Specimen Description URINE, RANDOM  Final   Special Requests   Final    NONE Performed at [facility], [address], [place], [place] [address]    Culture 20,000 COLONIES/mL ESCHERICHIA COLI (A)  Final   Report Status 03/31/2019 FINAL  Final   Organism ID, Bacteria ESCHERICHIA COLI (A)  Final      Susceptibility   Escherichia coli - MIC*    AMPICILLIN 8 SENSITIVE Sensitive     CEFAZOLIN <=4 SENSITIVE Sensitive     CEFTRIAXONE <=1 SENSITIVE Sensitive     CIPROFLOXACIN <=0.25 SENSITIVE Sensitive     GENTAMICIN <=1 SENSITIVE Sensitive     IMIPENEM <=0.25 SENSITIVE Sensitive     NITROFURANTOIN <=16 SENSITIVE Sensitive     TRIMETH/SULFA <=20 SENSITIVE Sensitive     AMPICILLIN/SULBACTAM 4 SENSITIVE Sensitive     PIP/TAZO <=4 SENSITIVE Sensitive      Extended ESBL NEGATIVE Sensitive     * 20,000 COLONIES/mL ESCHERICHIA COLI  Culture, Urine     Status: None   Collection Time: 04/02/19 10:08 AM   Specimen: Urine, Clean Catch  Result Value Ref Range Status   Specimen Description URINE, CLEAN CATCH  Final   Special Requests NONE  Final   Culture   Final    NO GROWTH Performed at [facility], [address], [place], [place] [address]    Report Status 04/03/2019 FINAL  Final  Novel Coronavirus, NAA (hospital order; send-out to ref lab)     Status: None   Collection Time: 04/02/19 12:32 PM   Specimen: Nasopharyngeal Swab; Respiratory  Result Value Ref Range Status   SARS-CoV-2, NAA NOT DETECTED NOT DETECTED Final    Comment: (NOTE) This test was developed and its performance characteristics determined by [employer]. This test has not been FDA cleared or approved. This test has been authorized by FDA under an Emergency Use Authorization (EUA). This test is only authorized for the duration of time the declaration that circumstances exist justifying the authorization of the emergency use of in vitro diagnostic tests for detection of SARS-CoV-2 virus and/or diagnosis of COVID-19 infection under section 564(b)(1) of the Act, 21 U.S.C. 643PIR-5(J)(8), unless the authorization is terminated or revoked sooner. When diagnostic testing is negative, the possibility of a false negative result should be considered in the context of a patient's recent exposures and the presence of clinical signs and symptoms consistent with COVID-19. An individual without symptoms of COVID-19 and who is not shedding SARS-CoV-2 virus would expect to have a negative (not detected) result in this assay. Performed  At: [facility] [address] [place], [place] [id number] Jolene Schimke MD ZS:[id number]  Coronavirus Source NASOPHARYNGEAL  Final    Comment: Performed at [facility], [address] [place], [place] [address]      Labs: Basic Metabolic Panel: Recent Labs  Lab 04/02/19 0308 04/02/19 1829 04/03/19 0352 04/04/19 0136 04/05/19 0304 04/06/19 0247  NA 135 136 139 138 137 140  K 3.5 4.0 3.6 4.1 3.7 4.1  CL 105 105 107 104 101 102  CO2 23 24 26 29 29 31   GLUCOSE 107* 111* 104* 94 109* 105*  BUN 17 16 15 14 11 12   CREATININE 0.82 0.78 0.84 0.76 0.78 0.80  CALCIUM 10.1 10.5* 10.1 9.8 9.7 9.7  MG 1.8  --   --   --   --   --    Liver Function Tests: Recent Labs  Lab 04/02/19 1829 04/03/19 0352 04/04/19 0136 04/05/19 0304 04/06/19 0247  AST 66* 50* 38 36 39  ALT 17 15 12 15 16   ALKPHOS 177* 160* 145* 137* 126  BILITOT 1.0 0.7 0.8 0.5 0.6  PROT 5.9* 5.7* 6.0* 6.4* 6.3*  ALBUMIN 1.8* 1.7* 1.7* 1.7* 1.7*   No results for input(s): LIPASE, AMYLASE in the last 168 hours. Recent Labs  Lab 04/02/19 1829  AMMONIA 35   CBC: Recent Labs  Lab 04/02/19 1829 04/03/19 0352 04/04/19 0136 04/05/19 0304 04/06/19 0247  WBC 20.1* 17.8* 14.9* 14.4* 9.8  NEUTROABS 17.9* 14.6* 12.3* 12.1* 8.1*  HGB 8.6* 8.1* 8.3* 8.8* 8.5*  HCT 28.5* 26.8* 27.3* 29.1* 28.5*  MCV 97.9 97.1 97.8 98.6 99.0  PLT 427* 421* 440* 455* 454*   Cardiac Enzymes: No results for input(s): CKTOTAL, CKMB, CKMBINDEX, TROPONINI in the last 168 hours. BNP: BNP (last 3 results) Recent Labs    04/03/19 1150  BNP 775.4*    ProBNP (last 3 results) No results for input(s): PROBNP in the last 8760 hours.  CBG: Recent Labs  Lab 04/06/19 0620 04/06/19 1108 04/06/19 1556 04/06/19 2103 04/07/19 0610  GLUCAP 99 93 97 120* 96       Signed:  Rhetta Mura MD   [facility] 04/07/2019, 10:48 AM

## 2019-04-07 NOTE — Progress Notes (Signed)
CSW consulted with patient's husband and son regarding bed offers and choice, they report choosing [facility]   CSW informed Miquel Dunn of this and requested they start insurance authorization with [employer]. CSW explained insurance authorization to family.   [place]

## 2019-04-07 NOTE — Plan of Care (Signed)

## 2019-04-07 NOTE — Progress Notes (Signed)
  Speech Language Pathology Treatment: Dysphagia  Patient Details Name: Amy Bryant MRN: [id number] DOB: 06/18/1956 Today's Date: 04/07/2019 Time: 2202-5427 SLP Time Calculation (min) (ACUTE ONLY): 11 min  Assessment / Plan / Recommendation Clinical Impression  Pt is more alert and interactive compared to initial SLP evaluation. She is now able to tell me more about her history, sharing that she used to have coughing associated with meals that has improved since she had an endoscopy (although she denies having her esophagus stretched at that time). She self-fed water and graham crackers with no overt signs of aspiration. No cueing was needed for safety, although she did need assist with getting her into a more upright position. Recommend continuing regular solids and thin liquids with set-up assist.    HPI HPI: Pt is a 63 y/o female with medical history significant of GERD, atrial fibrillation on Coumadin, obesity hypoventilation syndrome with CPAP at nighttime, chronic respiratory failure with chronic 4 L of nasal cannula use, morbid obesity, hypothyroidism, chronic pressure ulcer wounds on bilateral posterior thighs presented with sepsis, cellulitis, afib.  Pt started to develop increased confusion and there was concern for aspiration per MD, therefore SLP was ordered.      SLP Plan  Continue with current plan of care       Recommendations  Diet recommendations: Regular;Thin liquid Liquids provided via: Cup;Straw Medication Administration: Whole meds with liquid Supervision: Patient able to self feed;Intermittent supervision to cue for compensatory strategies Compensations: Slow rate;Small sips/bites Postural Changes and/or Swallow Maneuvers: Seated upright 90 degrees;Upright 30-60 min after meal                Oral Care Recommendations: Oral care BID Follow up Recommendations: Skilled Nursing facility SLP Visit Diagnosis: Dysphagia, unspecified (R13.10) Plan: Continue with  current plan of care       GO                Amy Bryant Amy Bryant 04/07/2019, 1:37 PM   Amy Bryant, M.A. [place] Acute Environmental education officer [phone] Office [phone]

## 2019-04-08 DIAGNOSIS — Z9989 Dependence on other enabling machines and devices: Secondary | ICD-10-CM

## 2019-04-08 DIAGNOSIS — E662 Morbid (severe) obesity with alveolar hypoventilation: Secondary | ICD-10-CM

## 2019-04-08 DIAGNOSIS — J690 Pneumonitis due to inhalation of food and vomit: Secondary | ICD-10-CM

## 2019-04-08 LAB — GLUCOSE, CAPILLARY
Glucose-Capillary: 83 mg/dL (ref 70–99)
Glucose-Capillary: 128 mg/dL — ABNORMAL HIGH (ref 70–99)
Glucose-Capillary: 98 mg/dL (ref 70–99)
Glucose-Capillary: 131 mg/dL — ABNORMAL HIGH (ref 70–99)
Glucose-Capillary: 148 mg/dL — ABNORMAL HIGH (ref 70–99)
Glucose-Capillary: 107 mg/dL — ABNORMAL HIGH (ref 70–99)

## 2019-04-08 LAB — CBC WITH DIFFERENTIAL/PLATELET
Abs Immature Granulocytes: 0.07 10*3/uL (ref 0.00–0.07)
Basophils Absolute: 0 10*3/uL (ref 0.0–0.1)
Basophils Relative: 0 %
Eosinophils Absolute: 0.1 10*3/uL (ref 0.0–0.5)
Eosinophils Relative: 1 %
HCT: 29.1 % — ABNORMAL LOW (ref 36.0–46.0)
Hemoglobin: 8.7 g/dL — ABNORMAL LOW (ref 12.0–15.0)
Immature Granulocytes: 1 %
Lymphocytes Relative: 12 %
Lymphs Abs: 1.1 10*3/uL (ref 0.7–4.0)
MCH: 29.6 pg (ref 26.0–34.0)
MCHC: 29.9 g/dL — ABNORMAL LOW (ref 30.0–36.0)
MCV: 99 fL (ref 80.0–100.0)
Monocytes Absolute: 0.4 10*3/uL (ref 0.1–1.0)
Monocytes Relative: 5 %
Neutro Abs: 7.5 10*3/uL (ref 1.7–7.7)
Neutrophils Relative %: 81 %
Platelets: 414 10*3/uL — ABNORMAL HIGH (ref 150–400)
RBC: 2.94 MIL/uL — ABNORMAL LOW (ref 3.87–5.11)
RDW: 15.6 % — ABNORMAL HIGH (ref 11.5–15.5)
WBC: 9.2 10*3/uL (ref 4.0–10.5)
nRBC: 0 % (ref 0.0–0.2)

## 2019-04-08 LAB — BASIC METABOLIC PANEL
Anion gap: 6 (ref 5–15)
BUN: 15 mg/dL (ref 8–23)
CO2: 33 mmol/L — ABNORMAL HIGH (ref 22–32)
Calcium: 10 mg/dL (ref 8.9–10.3)
Chloride: 95 mmol/L — ABNORMAL LOW (ref 98–111)
Creatinine, Ser: 1.05 mg/dL — ABNORMAL HIGH (ref 0.44–1.00)
GFR calc Af Amer: >60 mL/min (ref >60)
GFR calc non Af Amer: 56 mL/min — ABNORMAL LOW (ref >60)
Glucose, Bld: 99 mg/dL (ref 70–99)
Potassium: 4.2 mmol/L (ref 3.5–5.1)
Sodium: 134 mmol/L — ABNORMAL LOW (ref 135–145)

## 2019-04-08 LAB — PROTIME-INR
INR: 2.9 — ABNORMAL HIGH (ref 0.8–1.2)
Prothrombin Time: 30 seconds — ABNORMAL HIGH (ref 11.4–15.2)

## 2019-04-08 MED ORDER — MORPHINE SULFATE (PF) 2 MG/ML IV SOLN
2.0000 mg | INTRAVENOUS | Status: DC | PRN
  Administered 2019-04-08: 2 mg via INTRAVENOUS
  Filled 2019-04-08: qty 1

## 2019-04-08 MED ORDER — GERHARDT'S BUTT CREAM
TOPICAL_CREAM | Freq: Three times a day (TID) | CUTANEOUS | Status: DC | PRN
  Filled 2019-04-08 (×2): qty 1

## 2019-04-08 MED ORDER — WARFARIN SODIUM 1 MG PO TABS
1.5000 mg | ORAL_TABLET | Freq: Once | ORAL | Status: AC
  Administered 2019-04-08: 1.5 mg via ORAL
  Filled 2019-04-08: qty 1

## 2019-04-08 NOTE — Progress Notes (Signed)
Patient refused the CPAP machine for the evening. Will continue to monitor patient.

## 2019-04-08 NOTE — Progress Notes (Signed)
Per Dr. Sherral Hammers place order for 2 mg of morphine PRN for pain. Prior to each bandage change.  Lucile Crater, RN

## 2019-04-08 NOTE — Care Management (Signed)
Per CSW insurance auth not yet obtained by SNF.  CM will continue to follow

## 2019-04-08 NOTE — Progress Notes (Addendum)
PROGRESS NOTE    Amy Bryant  OZH:[id number] DOB: 1956/03/14 DOA: 03/28/2019 PCP: Judy Pimple, MD   Brief Narrative:  63 yo WF PMHx bipolar, diabetes type II uncontrolled with complication, COPD, OSA/OHS, chronic respiratory failure on 4 L home O2, chronic diastolic CHF (EF 60 to 65% January 09, 2019, atrial fibrillation CHaD2Vasc2 score=4 on Eliquis.  MORBID OBESITY, hypothyroidism, chronic LEFT lower extremity wounds, upper GI bleed  Presents with complaints swelling and pain in her bilateral lower extremities.  Over the last 4 days she has had increased redness and erythema spreading up her right leg.  She reports that she has several wounds on the leg that have been weeping foul-smelling fluid.  She reports missing a couple days of her Lasix and has been sleeping in a recliner without elevating her legs.  Other associated symptoms include nausea with decreased food intake, sore throat, burning with urination, and productive cough with greenish sputum production.  Denies having any vomiting, fever, diarrhea  Subjective: A/O x4, negative CP, negative S OB, negative abdominal pain.  Negative RIGHT lower extremity pain.  States at home uses rolling walker.    Assessment & Plan:   Principal Problem:   Cellulitis of right leg Active Problems:   Hypothyroidism   Diabetes type 2, controlled (HCC)   Morbid obesity (HCC)   Atrial fibrillation (HCC)   OSA (obstructive sleep apnea)   Acute on chronic respiratory failure with hypoxia (HCC)   Aspiration pneumonia - Complete course antibiotics - Albuterol nebs as needed - Dulera 200-5 mcg/Act   Acute on chronic respiratory failure with hypoxia -Multifactorial pneumonia, noncompliance with CPAP, COPD, decompensated CHF, A. fib - Treat underlying causes - . OSA/OHS - CPAP per respiratory, per RN notes patient refusing CPAP.  COPD  Chronic diastolic CHF - EF 60 to 65% on January 09, 2019 --Strict in and out -Daily weight -  Cardizem 60 mg twice daily - Lasix 40 mg twice daily - Warfarin per pharmacy  Chronic atrial fibrillation -See CHF Recent Labs  Lab 04/05/19 0304 04/06/19 0247 04/07/19 0231 04/08/19 0344 04/09/19 0321  INR 2.9* 3.5* 3.3* 2.9* 2.4*  -Therapeutic on Coumadin  Sepsis - Secondary to cellulitis not an abscess - Completed course of IV antibiotics, current p.o. antibiotics should give additional coverage. - Wound care lanced blebs on her RIGHT lower extremity 6/28.  Dressing change per [facility] nurse  AKI Recent Labs  Lab 04/04/19 0136 04/05/19 0304 04/06/19 0247 04/08/19 0344 04/09/19 0321  CREATININE 0.76 0.78 0.80 1.05* 0.94  -Resolved  Diabetes type 2 controlled with complication -6/27 hemoglobin A1c= 5.2 - Sensitive SSI  Anemia - Anemia panel pending  Morbid obesity - Place patient on 2000 -calorie/day diet -Consult for help with appropriate diet.       DVT prophylaxis: Coumadin Code Status: Full Family Communication: None Disposition Plan: SNF   Consultants:    Procedures/Significant Events:     I have personally reviewed and interpreted all radiology studies and my findings are as above.  VENTILATOR SETTINGS:    Cultures   Antimicrobials: Anti-infectives (From admission, onward)   Start     Stop   04/07/19 0000  amoxicillin-clavulanate (AUGMENTIN) 875-125 MG tablet     04/13/19 2359   04/06/19 1430  amoxicillin-clavulanate (AUGMENTIN) 875-125 MG per tablet 1 tablet         04/02/19 1830  Ampicillin-Sulbactam (UNASYN) 3 g in sodium chloride 0.9 % 100 mL IVPB  Status:  Discontinued     04/06/19 1424  04/02/19 1400  cephALEXin (KEFLEX) capsule 250 mg  Status:  Discontinued     04/03/19 1104   04/02/19 1400  cephALEXin (KEFLEX) capsule 500 mg  Status:  Discontinued     04/02/19 1051   04/02/19 1045  doxycycline (VIBRA-TABS) tablet 100 mg  Status:  Discontinued     04/02/19 1051   04/02/19 1030  doxycycline (VIBRA-TABS) tablet 100 mg   Status:  Discontinued     04/02/19 1823   04/02/19 1030  doxycycline (VIBRA-TABS) tablet 100 mg  Status:  Discontinued     04/02/19 1035   04/01/19 1000  ceFEPIme (MAXIPIME) 2 g in sodium chloride 0.9 % 100 mL IVPB  Status:  Discontinued     04/01/19 0912   04/01/19 1000  ceFEPIme (MAXIPIME) 2 g in sodium chloride 0.9 % 100 mL IVPB  Status:  Discontinued     04/02/19 1029   04/01/19 0915  metroNIDAZOLE (FLAGYL) IVPB 500 mg  Status:  Discontinued     04/02/19 1029   03/31/19 1400  fluconazole (DIFLUCAN) tablet 200 mg     03/31/19 1438   03/31/19 0830  vancomycin (VANCOCIN) 1,500 mg in sodium chloride 0.9 % 500 mL IVPB  Status:  Discontinued     04/02/19 1029   03/31/19 0730  vancomycin (VANCOCIN) IVPB 1000 mg/200 mL premix  Status:  Discontinued     03/31/19 0720   03/29/19 1400  ceFAZolin (ANCEF) IVPB 2g/100 mL premix  Status:  Discontinued     03/31/19 0720   03/29/19 0200  ceFAZolin (ANCEF) IVPB 2g/100 mL premix  Status:  Discontinued     03/29/19 1314   03/28/19 1400  ceFAZolin (ANCEF) IVPB 1 g/50 mL premix  Status:  Discontinued     03/28/19 1353   03/28/19 1400  ceFAZolin (ANCEF) IVPB 2g/100 mL premix     03/28/19 1442       Devices    LINES / TUBES:      Continuous Infusions:   Objective: Vitals:   04/08/19 0300 04/08/19 0713 04/08/19 0905 04/08/19 1056  BP: (!) 92/59  (!) 87/65 (!) 119/59  Pulse: 66 76  79  Resp: 17 16    Temp: 97.7 F (36.5 C)   98.8 F (37.1 C)  TempSrc: Oral   Oral  SpO2: (!) 62% 97%  98%  Weight:      Height:        Intake/Output Summary (Last 24 hours) at 04/08/2019 1407 Last data filed at 04/08/2019 1234 Gross per 24 hour  Intake 460 ml  Output -  Net 460 ml   Filed Weights   04/01/19 0404 04/02/19 0310 04/03/19 0500  Weight: (!) 152.9 kg (!) 155.6 kg (!) 152.4 kg    Examination:  General: A/O x4, positive chronic acute respiratory distress (on 4 L O2 which is her home dose) Eyes: negative scleral hemorrhage, negative  anisocoria, negative icterus ENT: Negative Runny nose, negative gingival bleeding, Neck:  Negative scars, masses, torticollis, lymphadenopathy, JVD Lungs: Although difficult to auscultate secondary to body habitus appears clear to auscultation bilaterally without wheezes or crackles Cardiovascular: Regular rate and rhythm without murmur gallop or rub normal S1 and S2 Abdomen: MORBIDLY OBESE, negative abdominal pain, nondistended, positive soft, bowel sounds, no rebound, no ascites, no appreciable mass Extremities: Positive significant bilateral cyanosis, negative clubbing, RIGHT lower extremity new dressing did not take down to view PT/DP pulse +1 Skin: Bilateral acanthosis nigricans  Psychiatric:  Negative depression, negative anxiety, negative fatigue, negative mania  Central  nervous system:  Cranial nerves II through XII intact, tongue/uvula midline, all extremities muscle strength 5/5, sensation intact throughout, finger nose finger bilateral within normal limits, quick finger touch bilateral within normal limits, negative Romberg sign, heel to shin bilateral within normal limits, standing on 1 foot bilateral within normal limits, walking on tiptoes within normal limits, walking on heels within normal limits, negative dysarthria, negative expressive aphasia, negative receptive aphasia.  .     Data Reviewed: Care during the described time interval was provided by me .  I have reviewed this patient's available data, including medical history, events of note, physical examination, and all test results as part of my evaluation.   CBC: Recent Labs  Lab 04/03/19 0352 04/04/19 0136 04/05/19 0304 04/06/19 0247 04/08/19 0344  WBC 17.8* 14.9* 14.4* 9.8 9.2  NEUTROABS 14.6* 12.3* 12.1* 8.1* 7.5  HGB 8.1* 8.3* 8.8* 8.5* 8.7*  HCT 26.8* 27.3* 29.1* 28.5* 29.1*  MCV 97.1 97.8 98.6 99.0 99.0  PLT 421* 440* 455* 454* 034*   Basic Metabolic Panel: Recent Labs  Lab 04/02/19 0308  04/03/19 0352  04/04/19 0136 04/05/19 0304 04/06/19 0247 04/08/19 0344  NA 135   < > 139 138 137 140 134*  K 3.5   < > 3.6 4.1 3.7 4.1 4.2  CL 105   < > 107 104 101 102 95*  CO2 23   < > 26 29 29 31  33*  GLUCOSE 107*   < > 104* 94 109* 105* 99  BUN 17   < > 15 14 11 12 15   CREATININE 0.82   < > 0.84 0.76 0.78 0.80 1.05*  CALCIUM 10.1   < > 10.1 9.8 9.7 9.7 10.0  MG 1.8  --   --   --   --   --   --    < > = values in this interval not displayed.   GFR: Estimated Creatinine Clearance: 82.4 mL/min (A) (by C-G formula based on SCr of 1.05 mg/dL (H)). Liver Function Tests: Recent Labs  Lab 04/02/19 1829 04/03/19 0352 04/04/19 0136 04/05/19 0304 04/06/19 0247  AST 66* 50* 38 36 39  ALT 17 15 12 15 16   ALKPHOS 177* 160* 145* 137* 126  BILITOT 1.0 0.7 0.8 0.5 0.6  PROT 5.9* 5.7* 6.0* 6.4* 6.3*  ALBUMIN 1.8* 1.7* 1.7* 1.7* 1.7*   No results for input(s): LIPASE, AMYLASE in the last 168 hours. Recent Labs  Lab 04/02/19 1829  AMMONIA 35   Coagulation Profile: Recent Labs  Lab 04/04/19 0136 04/05/19 0304 04/06/19 0247 04/07/19 0231 04/08/19 0344  INR 2.5* 2.9* 3.5* 3.3* 2.9*   Cardiac Enzymes: No results for input(s): CKTOTAL, CKMB, CKMBINDEX, TROPONINI in the last 168 hours. BNP (last 3 results) No results for input(s): PROBNP in the last 8760 hours. HbA1C: No results for input(s): HGBA1C in the last 72 hours. CBG: Recent Labs  Lab 04/07/19 1607 04/07/19 2118 04/08/19 0601 04/08/19 0902 04/08/19 1127  GLUCAP 93 107* 83 128* 98   Lipid Profile: No results for input(s): CHOL, HDL, LDLCALC, TRIG, CHOLHDL, LDLDIRECT in the last 72 hours. Thyroid Function Tests: No results for input(s): TSH, T4TOTAL, FREET4, T3FREE, THYROIDAB in the last 72 hours. Anemia Panel: No results for input(s): VITAMINB12, FOLATE, FERRITIN, TIBC, IRON, RETICCTPCT in the last 72 hours. Urine analysis:    Component Value Date/Time   COLORURINE YELLOW 04/02/2019 1008   APPEARANCEUR HAZY (A) 04/02/2019  1008   LABSPEC 1.012 04/02/2019 1008   PHURINE 6.0 04/02/2019  1008   GLUCOSEU NEGATIVE 04/02/2019 1008   GLUCOSEU NEGATIVE 02/21/2007 1652   HGBUR NEGATIVE 04/02/2019 1008   HGBUR negative 04/21/2010 1548   BILIRUBINUR NEGATIVE 04/02/2019 1008   BILIRUBINUR Negative 12/03/2017 1443   KETONESUR NEGATIVE 04/02/2019 1008   PROTEINUR 30 (A) 04/02/2019 1008   UROBILINOGEN 0.2 12/03/2017 1443   UROBILINOGEN 0.2 04/21/2010 1548   NITRITE NEGATIVE 04/02/2019 1008   LEUKOCYTESUR TRACE (A) 04/02/2019 1008   Sepsis Labs: @LABRCNTIP (procalcitonin:4,lacticidven:4)  ) Recent Results (from the past 240 hour(s))  Urine Culture     Status: Abnormal   Collection Time: 03/29/19  2:40 PM   Specimen: Urine, Random  Result Value Ref Range Status   Specimen Description URINE, RANDOM  Final   Special Requests   Final    NONE Performed at [facility], [address] [place], [place] [address]    Culture 20,000 COLONIES/mL ESCHERICHIA COLI (A)  Final   Report Status 03/31/2019 FINAL  Final   Organism ID, Bacteria ESCHERICHIA COLI (A)  Final      Susceptibility   Escherichia coli - MIC*    AMPICILLIN 8 SENSITIVE Sensitive     CEFAZOLIN <=4 SENSITIVE Sensitive     CEFTRIAXONE <=1 SENSITIVE Sensitive     CIPROFLOXACIN <=0.25 SENSITIVE Sensitive     GENTAMICIN <=1 SENSITIVE Sensitive     IMIPENEM <=0.25 SENSITIVE Sensitive     NITROFURANTOIN <=16 SENSITIVE Sensitive     TRIMETH/SULFA <=20 SENSITIVE Sensitive     AMPICILLIN/SULBACTAM 4 SENSITIVE Sensitive     PIP/TAZO <=4 SENSITIVE Sensitive     Extended ESBL NEGATIVE Sensitive     * 20,000 COLONIES/mL ESCHERICHIA COLI  Culture, Urine     Status: None   Collection Time: 04/02/19 10:08 AM   Specimen: Urine, Clean Catch  Result Value Ref Range Status   Specimen Description URINE, CLEAN CATCH  Final   Special Requests NONE  Final   Culture   Final    NO GROWTH Performed at [facility], [address] [place], [place] [address]     Report Status 04/03/2019 FINAL  Final  Novel Coronavirus, NAA (hospital order; send-out to ref lab)     Status: None   Collection Time: 04/02/19 12:32 PM   Specimen: Nasopharyngeal Swab; Respiratory  Result Value Ref Range Status   SARS-CoV-2, NAA NOT DETECTED NOT DETECTED Final    Comment: (NOTE) This test was developed and its performance characteristics determined by [facility]. This test has not been FDA cleared or approved. This test has been authorized by FDA under an Emergency Use Authorization (EUA). This test is only authorized for the duration of time the declaration that circumstances exist justifying the authorization of the emergency use of in vitro diagnostic tests for detection of SARS-CoV-2 virus and/or diagnosis of COVID-19 infection under section 564(b)(1) of the Act, 21 U.S.C. 098JXB-1(Y)(7360bbb-3(b)(1), unless the authorization is terminated or revoked sooner. When diagnostic testing is negative, the possibility of a false negative result should be considered in the context of a patient's recent exposures and the presence of clinical signs and symptoms consistent with COVID-19. An individual without symptoms of COVID-19 and who is not shedding SARS-CoV-2 virus would expect to have a negative (not detected) result in this assay. Performed  At: [facility] [address] [place], [place] [id number] Jolene SchimkeNagendra Sanjai MD QM:[id number]:[phone]    Coronavirus Source NASOPHARYNGEAL  Final    Comment: Performed at [facility], [address] [place], [place]  [address]         Radiology Studies: No results found.      Scheduled Meds: . amoxicillin-clavulanate  1 tablet Oral Q12H  . diltiazem  60 mg Oral Q12H  . FLUoxetine  40 mg Oral Daily  . furosemide  40 mg Intravenous BID  . insulin aspart  0-5 Units Subcutaneous QHS  . insulin aspart  0-9 Units Subcutaneous TID WC  . levothyroxine  175 mcg Oral QAC breakfast  . mometasone-formoterol  2 puff  Inhalation BID  . potassium chloride  40 mEq Oral Daily  . senna-docusate  1 tablet Oral QHS  . simvastatin  20 mg Oral q1800  . sodium chloride flush  3 mL Intravenous Q12H  . sorbitol  20 mL Oral Daily  . warfarin  1.5 mg Oral ONCE-1800  . Warfarin - Pharmacist Dosing Inpatient   Does not apply q1800   Continuous Infusions:   LOS: 11 days   The patient is critically ill with multiple organ systems failure and requires high complexity decision making for assessment and support, frequent evaluation and titration of therapies, application of advanced monitoring technologies and extensive interpretation of multiple databases. Critical Care Time devoted to patient care services described in this note  Time spent: 40 minutes     Reily Ilic, Roselind MessierURTIS J, MD [facility] Pager [phone]  If 7PM-7AM, please contact night-coverage [email or web address] Password [email or web address] 04/08/2019, 2:07 PM

## 2019-04-08 NOTE — Progress Notes (Signed)
Physical Therapy Treatment Patient Details Name: Amy ShellingDeborah G Bryant MRN: [id number] DOB: 1956-04-06 Today's Date: 04/08/2019    History of Present Illness Pt is a 63 y/o female with medical history significant of atrial fibrillation on Coumadin, obesity hypoventilation syndrome with CPAP at nighttime, chronic respiratory failure with chronic 4 L of nasal cannula use, morbid obesity, hypothyroidism, chronic pressure ulcer wounds on bilateral posterior thighs presented with sepsis, cellulitis, afib.      PT Comments    Pt admitted with above diagnosis. Pt currently with functional limitations due to balance and endurance deficits. Pt was able to sit to EOB and perform ADLs.  Pt sat a total of 20 min with min guard assist. One standing attempt but limited due to right LE pain. Pt more motivated and progressing daily.  Pt will benefit from skilled PT to increase their independence and safety with mobility to allow discharge to the venue listed below.     Follow Up Recommendations  SNF;Supervision/Assistance - 24 hour     Equipment Recommendations  Other (comment)(TBA)    Recommendations for Other Services       Precautions / Restrictions Precautions Precautions: Fall Restrictions Weight Bearing Restrictions: No    Mobility  Bed Mobility Overal bed mobility: Needs Assistance Bed Mobility: Supine to Sit;Sit to Supine     Supine to sit: Min assist;+2 for safety/equipment Sit to supine: Mod assist;+2 for physical assistance;+2 for safety/equipment   General bed mobility comments: transitioned to EOB with increased time and effort, able to manage B LEs but requires min assist for trunk support; returned to supine with B LE support and [facility]   Transfers Overall transfer level: Needs assistance Equipment used: Rolling walker (2 wheeled) Transfers: Sit to/from Stand Sit to Stand: Min assist;+2 physical assistance;+2 safety/equipment;From elevated surface          General transfer comment: Pt able to stand with min assist +2 from elevated bed, pulling on RW given cueing for foward lean and blocking R LE.  Limited tolerance due to pain in R LE today.   Ambulation/Gait                 Stairs             Wheelchair Mobility    Modified Rankin (Stroke Patients Only)       Balance Overall balance assessment: Needs assistance Sitting-balance support: No upper extremity supported;Feet supported Sitting balance-Leahy Scale: Fair Sitting balance - Comments: EOB with supervision. Performed several ADLs, washed pts back and did shower cap on pt.    Standing balance support: Bilateral upper extremity supported;During functional activity Standing balance-Leahy Scale: Poor Standing balance comment: reliant on B UE using RW                            Cognition Arousal/Alertness: Awake/alert Behavior During Therapy: WFL for tasks assessed/performed Overall Cognitive Status: Impaired/Different from baseline Area of Impairment: Memory;Safety/judgement;Awareness                     Memory: Decreased short-term memory   Safety/Judgement: Decreased awareness of safety;Decreased awareness of deficits Awareness: Emergent   General Comments: pt with improved cognition today, following multiple step commands, problem solving through needs, and awareness to deficits; continue to assess as needed      Exercises General Exercises - Lower Extremity Ankle Circles/Pumps: AROM;Both;10 reps;Supine Quad Sets: AROM;Both;10 reps;Supine Heel Slides: AAROM;Both;5 reps;Supine  General Comments General comments (skin integrity, edema, etc.): Bil LE edema      Pertinent Vitals/Pain Pain Assessment: Faces Faces Pain Scale: Hurts even more Pain Location: R LE  Pain Descriptors / Indicators: Aching;Discomfort;Guarding;Grimacing;Pressure Pain Intervention(s): Limited activity within patient's tolerance;Monitored during  session;Repositioned    Home Living                      Prior Function            PT Goals (current goals can now be found in the care plan section) Acute Rehab PT Goals Patient Stated Goal: to go home Progress towards PT goals: Progressing toward goals    Frequency    Min 2X/week      PT Plan Current plan remains appropriate    Co-evaluation PT/OT/SLP Co-Evaluation/Treatment: Yes Reason for Co-Treatment: Complexity of the patient's impairments (multi-system involvement) PT goals addressed during session: Mobility/safety with mobility OT goals addressed during session: ADL's and self-care      AM-PAC PT "6 Clicks" Mobility   Outcome Measure  Help needed turning from your back to your side while in a flat bed without using bedrails?: A Lot Help needed moving from lying on your back to sitting on the side of a flat bed without using bedrails?: A Little Help needed moving to and from a bed to a chair (including a wheelchair)?: A Lot Help needed standing up from a chair using your arms (e.g., wheelchair or bedside chair)?: A Lot Help needed to walk in hospital room?: Total Help needed climbing 3-5 steps with a railing? : Total 6 Click Score: 11    End of Session Equipment Utilized During Treatment: Gait belt;Oxygen Activity Tolerance: Patient limited by fatigue;Patient limited by pain Patient left: with call bell/phone within reach;in bed;with bed alarm set Nurse Communication: Mobility status PT Visit Diagnosis: Muscle weakness (generalized) (M62.81);Pain Pain - Right/Left: Right Pain - part of body: Leg     Time: 1055-1120 PT Time Calculation (min) (ACUTE ONLY): 25 min  Charges:  $Therapeutic Activity: 8-22 mins                     [place] Pager:  [phone]  Office:  [place] 04/08/2019, 1:17 PM

## 2019-04-08 NOTE — Care Management Important Message (Signed)
Important Message  Patient Details  Name: Amy Bryant MRN: [id number] Date of Birth: [birth year]   Medicare Important Message Given:  Yes     Memory Argue 04/08/2019, 1:27 PM

## 2019-04-08 NOTE — Progress Notes (Signed)
Occupational Therapy Treatment Patient Details Name: Amy Bryant MRN: [id number] DOB: 1956/01/15 Today's Date: 04/08/2019    History of present illness Pt is a 63 y/o female with medical history significant of atrial fibrillation on Coumadin, obesity hypoventilation syndrome with CPAP at nighttime, chronic respiratory failure with chronic 4 L of nasal cannula use, morbid obesity, hypothyroidism, chronic pressure ulcer wounds on bilateral posterior thighs presented with sepsis, cellulitis, afib.     OT comments  Patient seen in conjunction with PT.  Patient requires min-mod assist +2 for bed mobility, able to engage in grooming tasks at EOB with setup assist.  Agreeable to attempt sit to stand from EOB, elevated bed with min assist +2 with pulling on RW--but patient with limited tolerance due to R LE pain.  Cognition improving, still limited by [facility] and awareness.  Agreeable to SNF rehab.  Will follow acutely.    Follow Up Recommendations  SNF;Supervision/Assistance - 24 hour    Equipment Recommendations  Other (comment)(TBD at next venue of care)    Recommendations for Other Services      Precautions / Restrictions Precautions Precautions: Fall Restrictions Weight Bearing Restrictions: No       Mobility Bed Mobility Overal bed mobility: Needs Assistance Bed Mobility: Supine to Sit;Sit to Supine     Supine to sit: Min assist;+2 for safety/equipment Sit to supine: Mod assist;+2 for physical assistance;+2 for safety/equipment   General bed mobility comments: transitioned to EOB with increased time and effort, able to manage B LEs but requires min assist for trunk support; returned to supine with B LE support and +2 to [facility]   Transfers Overall transfer level: Needs assistance Equipment used: Rolling walker (2 wheeled) Transfers: Sit to/from Stand Sit to Stand: Min assist;+2 physical assistance;+2 safety/equipment;From elevated surface         General transfer  comment: Pt able to stand with min assist +2 from elevated bed, pulling on RW given cueing for foward lean and blocking R LE.  Limited tolerance due to pain in R LE today.     Balance Overall balance assessment: Needs assistance Sitting-balance support: No upper extremity supported;Feet supported Sitting balance-Leahy Scale: Fair Sitting balance - Comments: EOB with supervision   Standing balance support: Bilateral upper extremity supported;During functional activity Standing balance-Leahy Scale: Poor Standing balance comment: reliant on B UE using RW                           ADL either performed or assessed with clinical judgement   ADL Overall ADL's : Needs assistance/impaired     Grooming: Supervision/safety;Sitting Grooming Details (indicate cue type and reason): EOB to wash and comb hair, wash face--no phyiscal assist required             Lower Body Dressing: Total assistance;+2 for physical assistance;+2 for safety/equipment;Sit to/from Market researcherstand     Toilet Transfer Details (indicate cue type and reason): deferred due to pain in R LE          Functional mobility during ADLs: Minimal assistance;Moderate assistance;+2 for physical assistance;+2 for safety/equipment General ADL Comments: pt limited by pain in R LE, generalized weakness, decreased activity tolerance     Vision       Perception     Praxis      Cognition Arousal/Alertness: Awake/alert Behavior During Therapy: WFL for tasks assessed/performed Overall Cognitive Status: Impaired/Different from baseline Area of Impairment: Memory;Safety/judgement;Awareness  Memory: Decreased short-term memory   Safety/Judgement: Decreased awareness of safety;Decreased awareness of deficits Awareness: Emergent   General Comments: pt with improved cognition today, following multiple step commands, problem solving through needs, and awareness to deficits; continue to assess as  needed        Exercises     Shoulder Instructions       General Comments      Pertinent Vitals/ Pain       Pain Assessment: Faces Faces Pain Scale: Hurts even more Pain Location: R LE  Pain Descriptors / Indicators: Aching;Discomfort;Guarding;Grimacing;Pressure Pain Intervention(s): Limited activity within patient's tolerance;Repositioned;Monitored during session  Home Living                                          Prior Functioning/Environment              Frequency  Min 2X/week        Progress Toward Goals  OT Goals(current goals can now be found in the care plan section)  Progress towards OT goals: Progressing toward goals  Acute Rehab OT Goals Patient Stated Goal: to go home OT Goal Formulation: With patient  Plan Discharge plan remains appropriate;Frequency remains appropriate    Co-evaluation    PT/OT/SLP Co-Evaluation/Treatment: Yes Reason for Co-Treatment: Complexity of the patient's impairments (multi-system involvement);For patient/therapist safety;To address functional/ADL transfers   OT goals addressed during session: ADL's and self-care      AM-PAC OT "6 Clicks" Daily Activity     Outcome Measure   Help from another person eating meals?: None Help from another person taking care of personal grooming?: None Help from another person toileting, which includes using toliet, bedpan, or urinal?: Total Help from another person bathing (including washing, rinsing, drying)?: A Lot Help from another person to put on and taking off regular upper body clothing?: A Little Help from another person to put on and taking off regular lower body clothing?: Total 6 Click Score: 15    End of Session Equipment Utilized During Treatment: Rolling walker;Oxygen  OT Visit Diagnosis: Other abnormalities of gait and mobility (R26.89);Muscle weakness (generalized) (M62.81);Other symptoms and signs involving cognitive function;Pain Pain -  Right/Left: Right Pain - part of body: Leg   Activity Tolerance Patient tolerated treatment well   Patient Left in bed;with call bell/phone within reach   Nurse Communication Mobility status        Time: 0569-7948 OT Time Calculation (min): 25 min  Charges: OT General Charges $OT Visit: 1 Visit OT Treatments $Self Care/Home Management : 8-22 mins  Delight Stare, [place] Pager [phone] Office [phone]    Delight Stare 04/08/2019, 12:52 PM

## 2019-04-08 NOTE — Progress Notes (Signed)
[place] for warfarin Indication: atrial fibrillation and pulmonary embolus  Allergies  Allergen Reactions  . Pseudoephedrine Other (See Comments)    REACTION: tightness in chest  . Augmentin [Amoxicillin-Pot Clavulanate] Itching and Rash    Did it involve swelling of the face/tongue/throat, SOB, or low BP? No Did it involve sudden or severe rash/hives, skin peeling, or any reaction on the inside of your mouth or nose? Yes Did you need to seek medical attention at a hospital or doctor's office? Yes When did it last happen?2019 If all above answers are "NO", may proceed with cephalosporin use.   . Latex Itching and Rash    Patient Measurements: Height: 5\' 5"  (165.1 cm) Weight: (unable to weigh bed scale broke. Pt is unable to stand.) IBW/kg (Calculated) : 57  Vital Signs: Temp: 97.7 F (36.5 C) (07/08 0300) Temp Source: Oral (07/08 0300) BP: 87/65 (07/08 0905) Pulse Rate: 76 (07/08 0713)  Labs: Recent Labs    04/06/19 0247 04/07/19 0231 04/08/19 0344  HGB 8.5*  --  8.7*  HCT 28.5*  --  29.1*  PLT 454*  --  414*  LABPROT 34.9* 33.3* 30.0*  INR 3.5* 3.3* 2.9*  CREATININE 0.80  --  1.05*    Estimated Creatinine Clearance: 82.4 mL/min (A) (by C-G formula based on SCr of 1.05 mg/dL (H)).   Medical History: Past Medical History:  Diagnosis Date  . Atrial fibrillation ([place])    with cardioversion success  . CHF (congestive heart failure) ([place])   . COPD (chronic obstructive pulmonary disease) ([place])   . Cor pulmonale ([facility])    and obesity hypoventilation syndrome  . Diabetes mellitus without complication ([place])   . GERD (gastroesophageal reflux disease)   . Hypothyroid   . Morbid obesity ([place])   . OA (osteoarthritis)   . Pulmonary embolism ([facility])    hx of on coumadin  . Reactive airways dysfunction syndrome ([facility])    Assessment: 47 yof presented to the ED with leg swelling. She is on chronic warfarin for history of afib  and PE. Warfarin restarted 7/3, after holding for a few days d/t to supratherapeutic INR, trended up to 3.5 held 7/6, INR 7/7 3.3. Restart 1.5 mg 7/7, INR 7/8 2.9. H/H improving. No bleeding noted. No current DDI noted, status post Flagyl.  PTA dose 2.5 mg MWF, 5 mg TTSS   Goal of Therapy:  INR 2-3 Monitor platelets by anticoagulation protocol: Yes   Plan:  Warfarin 1.5 mg x 1 tonight Daily INR  Nikkol Pai L. Devin Going, PharmD, [place] PGY1 Pharmacy Resident [employer]: [phone] 04/08/19 9:37 AM  Please check AMION for all [place] phone numbers After 10:00 PM, call the [place] [phone]

## 2019-04-09 DIAGNOSIS — I5032 Chronic diastolic (congestive) heart failure: Secondary | ICD-10-CM

## 2019-04-09 DIAGNOSIS — I480 Paroxysmal atrial fibrillation: Secondary | ICD-10-CM

## 2019-04-09 DIAGNOSIS — E1121 Type 2 diabetes mellitus with diabetic nephropathy: Secondary | ICD-10-CM

## 2019-04-09 LAB — GLUCOSE, CAPILLARY
Glucose-Capillary: 92 mg/dL (ref 70–99)
Glucose-Capillary: 105 mg/dL — ABNORMAL HIGH (ref 70–99)
Glucose-Capillary: 133 mg/dL — ABNORMAL HIGH (ref 70–99)
Glucose-Capillary: 114 mg/dL — ABNORMAL HIGH (ref 70–99)

## 2019-04-09 LAB — BASIC METABOLIC PANEL
Anion gap: 9 (ref 5–15)
BUN: 14 mg/dL (ref 8–23)
CO2: 30 mmol/L (ref 22–32)
Calcium: 10.1 mg/dL (ref 8.9–10.3)
Chloride: 97 mmol/L — ABNORMAL LOW (ref 98–111)
Creatinine, Ser: 0.94 mg/dL (ref 0.44–1.00)
GFR calc Af Amer: >60 mL/min (ref >60)
GFR calc non Af Amer: >60 mL/min (ref >60)
Glucose, Bld: 96 mg/dL (ref 70–99)
Potassium: 4.3 mmol/L (ref 3.5–5.1)
Sodium: 136 mmol/L (ref 135–145)

## 2019-04-09 LAB — CBC
HCT: 28.4 % — ABNORMAL LOW (ref 36.0–46.0)
Hemoglobin: 8.6 g/dL — ABNORMAL LOW (ref 12.0–15.0)
MCH: 30 pg (ref 26.0–34.0)
MCHC: 30.3 g/dL (ref 30.0–36.0)
MCV: 99 fL (ref 80.0–100.0)
Platelets: 398 10*3/uL (ref 150–400)
RBC: 2.87 MIL/uL — ABNORMAL LOW (ref 3.87–5.11)
RDW: 15.5 % (ref 11.5–15.5)
WBC: 7.7 10*3/uL (ref 4.0–10.5)
nRBC: 0 % (ref 0.0–0.2)

## 2019-04-09 LAB — VITAMIN B12: Vitamin B-12: 254 pg/mL (ref 180–914)

## 2019-04-09 LAB — NOVEL CORONAVIRUS, NAA (HOSP ORDER, SEND-OUT TO REF LAB; TAT 18-24 HRS): SARS-CoV-2, NAA: NOT DETECTED

## 2019-04-09 LAB — RETICULOCYTES
Immature Retic Fract: 16.7 % — ABNORMAL HIGH (ref 2.3–15.9)
RBC.: 2.87 MIL/uL — ABNORMAL LOW (ref 3.87–5.11)
Retic Count, Absolute: 60.6 10*3/uL (ref 19.0–186.0)
Retic Ct Pct: 2.1 % (ref 0.4–3.1)

## 2019-04-09 LAB — IRON AND TIBC
Iron: 38 ug/dL (ref 28–170)
Saturation Ratios: 18 % (ref 10.4–31.8)
TIBC: 207 ug/dL — ABNORMAL LOW (ref 250–450)
UIBC: 169 ug/dL

## 2019-04-09 LAB — PROTIME-INR
INR: 2.4 — ABNORMAL HIGH (ref 0.8–1.2)
Prothrombin Time: 25.6 seconds — ABNORMAL HIGH (ref 11.4–15.2)

## 2019-04-09 LAB — FERRITIN: Ferritin: 82 ng/mL (ref 11–307)

## 2019-04-09 LAB — FOLATE: Folate: 8.5 ng/mL (ref >5.9)

## 2019-04-09 LAB — MAGNESIUM: Magnesium: 1.8 mg/dL (ref 1.7–2.4)

## 2019-04-09 MED ORDER — WARFARIN SODIUM 2.5 MG PO TABS
2.5000 mg | ORAL_TABLET | Freq: Once | ORAL | Status: AC
  Administered 2019-04-09: 2.5 mg via ORAL
  Filled 2019-04-09: qty 1

## 2019-04-09 MED ORDER — MAGNESIUM SULFATE IN D5W 1-5 GM/100ML-% IV SOLN
1.0000 g | Freq: Once | INTRAVENOUS | Status: AC
  Administered 2019-04-09: 1 g via INTRAVENOUS
  Filled 2019-04-09: qty 100

## 2019-04-09 NOTE — Progress Notes (Signed)
ANTICOAGULATION CONSULT NOTE - Follow Up Consult  Pharmacy Consult for warfarin Indication: atrial fibrillation and pulmonary embolus  Allergies  Allergen Reactions  . Pseudoephedrine Other (See Comments)    REACTION: tightness in chest  . Augmentin [Amoxicillin-Pot Clavulanate] Itching and Rash    Did it involve swelling of the face/tongue/throat, SOB, or low BP? No Did it involve sudden or severe rash/hives, skin peeling, or any reaction on the inside of your mouth or nose? Yes Did you need to seek medical attention at a hospital or doctor's office? Yes When did it last happen?2019 If all above answers are "NO", may proceed with cephalosporin use.   . Latex Itching and Rash    Patient Measurements: Height: 5\' 5"  (165.1 cm) Weight: (could not get weight bed scale is broken RN notified) IBW/kg (Calculated) : 57  Vital Signs: Temp: 98.7 F (37.1 C) (07/09 1056) Temp Source: Oral (07/09 1056) BP: 100/62 (07/09 1056) Pulse Rate: 78 (07/09 0853)  Labs: Recent Labs    04/07/19 0231 04/08/19 0344 04/09/19 0321  HGB  --  8.7* 8.6*  HCT  --  29.1* 28.4*  PLT  --  414* 398  LABPROT 33.3* 30.0* 25.6*  INR 3.3* 2.9* 2.4*  CREATININE  --  1.05* 0.94    Estimated Creatinine Clearance: 92.1 mL/min (by C-G formula based on SCr of 0.94 mg/dL).   Medications:  Scheduled:  . amoxicillin-clavulanate  1 tablet Oral Q12H  . diltiazem  60 mg Oral Q12H  . FLUoxetine  40 mg Oral Daily  . furosemide  40 mg Intravenous BID  . insulin aspart  0-5 Units Subcutaneous QHS  . insulin aspart  0-9 Units Subcutaneous TID WC  . levothyroxine  175 mcg Oral QAC breakfast  . mometasone-formoterol  2 puff Inhalation BID  . potassium chloride  40 mEq Oral Daily  . senna-docusate  1 tablet Oral QHS  . simvastatin  20 mg Oral q1800  . sodium chloride flush  3 mL Intravenous Q12H  . sorbitol  20 mL Oral Daily  . warfarin  2.5 mg Oral ONCE-1800  . Warfarin - Pharmacist Dosing Inpatient    Does not apply q1800    Assessment: 52 yof presented to the ED with leg swelling. She is on chronic warfarin for history of afib and PE. PTA dose is 2.5mg  MWF, 5mg  TTSS.  Warfarin held d/t to supratherapeutic INR and restarted on 7/3. INR continued to trend up and warfarin was held again on 7/6 d/t INR of 3.5.   INR today is 2.4, CBC is stable with hgb 8.6, hct 28.4, plts 398. Per nursing no signs/symptoms of bleeding. No drug interactions currently affecting INR.        Goal of Therapy:  INR 2-3 Monitor platelets by anticoagulation protocol: Yes   Plan:  Warfarin 2.5 mg x 1  Check daily INR Continue to monitor CBC and s/sx of bleeding     Thank you,   Eddie Candle, PharmD PGY-1 Pharmacy Resident  Phone: [phone]

## 2019-04-09 NOTE — Plan of Care (Signed)

## 2019-04-09 NOTE — Progress Notes (Signed)
Patient refused the use of cpap for the evening. Will continue to monitor patient.  

## 2019-04-09 NOTE — Progress Notes (Signed)
  Speech Language Pathology Treatment: Dysphagia  Patient Details Name: Amy Bryant MRN: [id number] DOB: Oct 26, 1955 Today's Date: 04/09/2019 Time: 0850-0900 SLP Time Calculation (min) (ACUTE ONLY): 10 min  Assessment / Plan / Recommendation Clinical Impression  Pt was seen during breakfast meal, only wanting to eat her fruit cup. Min cues were provided during set-up for safety and repositioning to be more upright. Pt says she usually eats OOB at home, and that it is more challenging to be upright in the bed. Mild, delayed coughs were noted after she finished her fruit, and she used a thin liquid wash with Mod I that appeared to alleviate any further coughing. This appears more consistent with her baseline esophageal issues. Education about aspiration and esophageal precautions were reinforced. Will f/u briefly if she remains inpatient, but otherwise f/u can be done at [facility].   HPI HPI: Pt is a 63 y/o female with medical history significant of GERD, atrial fibrillation on Coumadin, obesity hypoventilation syndrome with CPAP at nighttime, chronic respiratory failure with chronic 4 L of nasal cannula use, morbid obesity, hypothyroidism, chronic pressure ulcer wounds on bilateral posterior thighs presented with sepsis, cellulitis, afib.  Pt started to develop increased confusion and there was concern for aspiration per MD, therefore SLP was ordered.      SLP Plan  Continue with current plan of care       Recommendations  Diet recommendations: Regular;Thin liquid Liquids provided via: Cup;Straw Medication Administration: Whole meds with liquid Supervision: Patient able to self feed;Intermittent supervision to cue for compensatory strategies Compensations: Slow rate;Small sips/bites Postural Changes and/or Swallow Maneuvers: Seated upright 90 degrees;Upright 30-60 min after meal                Oral Care Recommendations: Oral care BID Follow up Recommendations: Skilled Nursing facility SLP  Visit Diagnosis: Dysphagia, unspecified (R13.10) Plan: Continue with current plan of care       GO                Venita Sheffield Hugo Lybrand 04/09/2019, 9:13 AM  Pollyann Glen, M.A. [place] Acute Environmental education officer [phone] Office [phone]

## 2019-04-09 NOTE — Progress Notes (Signed)
PROGRESS NOTE    Amy Bryant  ZOX:[id number] DOB: 04-Aug-1956 DOA: 03/28/2019 PCP: Judy Pimpleower, Marne A, MD   Brief Narrative:  63 yo WF PMHx bipolar, diabetes type II uncontrolled with complication, COPD, OSA/OHS, chronic respiratory failure on 4 L home O2, chronic diastolic CHF (EF 60 to 65% January 09, 2019, atrial fibrillation CHaD2Vasc2 score=4 on Eliquis.  MORBID OBESITY, hypothyroidism, chronic LEFT lower extremity wounds, upper GI bleed  Presents with complaints swelling and pain in her bilateral lower extremities.  Over the last 4 days she has had increased redness and erythema spreading up her right leg.  She reports that she has several wounds on the leg that have been weeping foul-smelling fluid.  She reports missing a couple days of her Lasix and has been sleeping in a recliner without elevating her legs.  Other associated symptoms include nausea with decreased food intake, sore throat, burning with urination, and productive cough with greenish sputum production.  Denies having any vomiting, fever, diarrhea  Subjective: 7/9 A/O x4, negative CP, negative S OB, negative abdominal pain.   Assessment & Plan:   Principal Problem:   Cellulitis of right leg Active Problems:   Hypothyroidism   Diabetes type 2, controlled (HCC)   Morbid obesity (HCC)   Atrial fibrillation (HCC)   OSA (obstructive sleep apnea)   Acute on chronic respiratory failure with hypoxia (HCC)   Aspiration pneumonia - Complete course antibiotics - Albuterol nebs as needed - Dulera 200-5 mcg/Act   Acute on chronic respiratory failure with hypoxia -Multifactorial pneumonia, noncompliance with CPAP, COPD, decompensated CHF, A. fib - Treat underlying causes  Sepsis/RIGHT lower extremity cellulitis - Secondary to cellulitis not an abscess - Completed course of IV antibiotics, current p.o. antibiotics for cellulitis should give additional coverage. - Wound care lanced blebs on her RIGHT lower extremity 6/28.   [facility] . OSA/OHS - CPAP per respiratory, per RN notes patient refusing CPAP.  COPD -Albuterol PRN -Dulera 200-5 mcg/ACT  Chronic diastolic CHF - EF 60 to 65% on January 09, 2019 --Strict in and out -17.6 L -Daily weight Filed Weights   04/02/19 0310 04/03/19 0500  Weight: (!) 155.6 kg (!) 152.4 kg  - Cardizem 60 mg twice daily - Lasix 40 mg twice daily - Warfarin per pharmacy  Chronic atrial fibrillation -See CHF Recent Labs  Lab 04/05/19 0304 04/06/19 0247 04/07/19 0231 04/08/19 0344 04/09/19 0321  INR 2.9* 3.5* 3.3* 2.9* 2.4*  -Therapeutic on Coumadin  Hypomagnesmia - Magnesium goal> 2 - Magnesium IV 1 g  AKI Recent Labs  Lab 04/04/19 0136 04/05/19 0304 04/06/19 0247 04/08/19 0344 04/09/19 0321  CREATININE 0.76 0.78 0.80 1.05* 0.94  -Resolved  Diabetes type 2 controlled with complication -6/27 hemoglobin A1c= 5.2 - Sensitive SSI  Normocytic anemia - Anemia panel consistent with normocytic anemia.  Morbid obesity - Place patient on 2000 -calorie/day diet -Consult for help with appropriate diet.      DVT prophylaxis: Coumadin Code Status: Full Family Communication: None Disposition Plan: SNF   Consultants:    Procedures/Significant Events:     I have personally reviewed and interpreted all radiology studies and my findings are as above.  VENTILATOR SETTINGS:    Cultures   Antimicrobials: Anti-infectives (From admission, onward)   Start     Stop   04/07/19 0000  amoxicillin-clavulanate (AUGMENTIN) 875-125 MG tablet     04/13/19 2359   04/06/19 1430  amoxicillin-clavulanate (AUGMENTIN) 875-125 MG per tablet 1 tablet  04/02/19 1830  Ampicillin-Sulbactam (UNASYN) 3 g in sodium chloride 0.9 % 100 mL IVPB  Status:  Discontinued     04/06/19 1424   04/02/19 1400  cephALEXin (KEFLEX) capsule 250 mg  Status:  Discontinued     04/03/19 1104   04/02/19 1400  cephALEXin (KEFLEX) capsule 500 mg  Status:   Discontinued     04/02/19 1051   04/02/19 1045  doxycycline (VIBRA-TABS) tablet 100 mg  Status:  Discontinued     04/02/19 1051   04/02/19 1030  doxycycline (VIBRA-TABS) tablet 100 mg  Status:  Discontinued     04/02/19 1823   04/02/19 1030  doxycycline (VIBRA-TABS) tablet 100 mg  Status:  Discontinued     04/02/19 1035   04/01/19 1000  ceFEPIme (MAXIPIME) 2 g in sodium chloride 0.9 % 100 mL IVPB  Status:  Discontinued     04/01/19 0912   04/01/19 1000  ceFEPIme (MAXIPIME) 2 g in sodium chloride 0.9 % 100 mL IVPB  Status:  Discontinued     04/02/19 1029   04/01/19 0915  metroNIDAZOLE (FLAGYL) IVPB 500 mg  Status:  Discontinued     04/02/19 1029   03/31/19 1400  fluconazole (DIFLUCAN) tablet 200 mg     03/31/19 1438   03/31/19 0830  vancomycin (VANCOCIN) 1,500 mg in sodium chloride 0.9 % 500 mL IVPB  Status:  Discontinued     04/02/19 1029   03/31/19 0730  vancomycin (VANCOCIN) IVPB 1000 mg/200 mL premix  Status:  Discontinued     03/31/19 0720   03/29/19 1400  ceFAZolin (ANCEF) IVPB 2g/100 mL premix  Status:  Discontinued     03/31/19 0720   03/29/19 0200  ceFAZolin (ANCEF) IVPB 2g/100 mL premix  Status:  Discontinued     03/29/19 1314   03/28/19 1400  ceFAZolin (ANCEF) IVPB 1 g/50 mL premix  Status:  Discontinued     03/28/19 1353   03/28/19 1400  ceFAZolin (ANCEF) IVPB 2g/100 mL premix     03/28/19 1442       Devices    LINES / TUBES:      Continuous Infusions:   Objective: Vitals:   04/08/19 2330 04/09/19 0339 04/09/19 0600 04/09/19 0723  BP: 110/62 115/60 129/66   Pulse: 72 67 71   Resp: 19 16 17    Temp: 98.6 F (37 C) 98.6 F (37 C)  97.7 F (36.5 C)  TempSrc: Oral Oral  Oral  SpO2:  96% 95%   Weight:      Height:        Intake/Output Summary (Last 24 hours) at 04/09/2019 0735 Last data filed at 04/08/2019 2331 Gross per 24 hour  Intake 460 ml  Output 2050 ml  Net -1590 ml   Filed Weights   04/02/19 0310 04/03/19 0500  Weight: (!) 155.6 kg (!)  152.4 kg   Physical Exam:  General: A/O x4, positive chronic respiratory distress (on 4 L O2 ) Eyes: negative scleral hemorrhage, negative anisocoria, negative icterus ENT: Negative Runny nose, negative gingival bleeding, Neck:  Negative scars, masses, torticollis, lymphadenopathy, JVD Lungs: Although difficult to auscultate secondary to body habitus appears clear to auscultation bilaterally without wheezes or crackles.   Cardiovascular: Regular rate and rhythm without murmur gallop or rub normal S1 and S2 Abdomen: MORBIDLY OBESE negative abdominal pain, nondistended, positive soft, bowel sounds, no rebound, no ascites, no appreciable mass Extremities: No significant cyanosis, clubbing, or edema bilateral lower extremities Skin: Negative rashes, lesions, ulcers Psychiatric:  Negative depression,  negative anxiety, negative fatigue, negative mania  Central nervous system:  Cranial nerves II through XII intact, tongue/uvula midline, all extremities muscle strength 5/5, sensation intact throughout, negative dysarthria, negative expressive aphasia, negative receptive aphasia.  .     Data Reviewed: Care during the described time interval was provided by me .  I have reviewed this patient's available data, including medical history, events of note, physical examination, and all test results as part of my evaluation.   CBC: Recent Labs  Lab 04/03/19 0352 04/04/19 0136 04/05/19 0304 04/06/19 0247 04/08/19 0344 04/09/19 0321  WBC 17.8* 14.9* 14.4* 9.8 9.2 7.7  NEUTROABS 14.6* 12.3* 12.1* 8.1* 7.5  --   HGB 8.1* 8.3* 8.8* 8.5* 8.7* 8.6*  HCT 26.8* 27.3* 29.1* 28.5* 29.1* 28.4*  MCV 97.1 97.8 98.6 99.0 99.0 99.0  PLT 421* 440* 455* 454* 414* 398   Basic Metabolic Panel: Recent Labs  Lab 04/04/19 0136 04/05/19 0304 04/06/19 0247 04/08/19 0344 04/09/19 0321  NA 138 137 140 134* 136  K 4.1 3.7 4.1 4.2 4.3  CL 104 101 102 95* 97*  CO2 29 29 31  33* 30  GLUCOSE 94 109* 105* 99 96   BUN 14 11 12 15 14   CREATININE 0.76 0.78 0.80 1.05* 0.94  CALCIUM 9.8 9.7 9.7 10.0 10.1  MG  --   --   --   --  1.8   GFR: Estimated Creatinine Clearance: 92.1 mL/min (by C-G formula based on SCr of 0.94 mg/dL). Liver Function Tests: Recent Labs  Lab 04/02/19 1829 04/03/19 0352 04/04/19 0136 04/05/19 0304 04/06/19 0247  AST 66* 50* 38 36 39  ALT 17 15 12 15 16   ALKPHOS 177* 160* 145* 137* 126  BILITOT 1.0 0.7 0.8 0.5 0.6  PROT 5.9* 5.7* 6.0* 6.4* 6.3*  ALBUMIN 1.8* 1.7* 1.7* 1.7* 1.7*   No results for input(s): LIPASE, AMYLASE in the last 168 hours. Recent Labs  Lab 04/02/19 1829  AMMONIA 35   Coagulation Profile: Recent Labs  Lab 04/05/19 0304 04/06/19 0247 04/07/19 0231 04/08/19 0344 04/09/19 0321  INR 2.9* 3.5* 3.3* 2.9* 2.4*   Cardiac Enzymes: No results for input(s): CKTOTAL, CKMB, CKMBINDEX, TROPONINI in the last 168 hours. BNP (last 3 results) No results for input(s): PROBNP in the last 8760 hours. HbA1C: No results for input(s): HGBA1C in the last 72 hours. CBG: Recent Labs  Lab 04/08/19 1127 04/08/19 1604 04/08/19 2015 04/08/19 2220 04/09/19 0618  GLUCAP 98 131* 148* 107* 92   Lipid Profile: No results for input(s): CHOL, HDL, LDLCALC, TRIG, CHOLHDL, LDLDIRECT in the last 72 hours. Thyroid Function Tests: No results for input(s): TSH, T4TOTAL, FREET4, T3FREE, THYROIDAB in the last 72 hours. Anemia Panel: Recent Labs    04/09/19 0321  VITAMINB12 254  FOLATE 8.5  FERRITIN 82  TIBC 207*  IRON 38  RETICCTPCT 2.1   Urine analysis:    Component Value Date/Time   COLORURINE YELLOW 04/02/2019 1008   APPEARANCEUR HAZY (A) 04/02/2019 1008   LABSPEC 1.012 04/02/2019 1008   PHURINE 6.0 04/02/2019 1008   GLUCOSEU NEGATIVE 04/02/2019 1008   GLUCOSEU NEGATIVE 02/21/2007 1652   HGBUR NEGATIVE 04/02/2019 1008   HGBUR negative 04/21/2010 1548   BILIRUBINUR NEGATIVE 04/02/2019 1008   BILIRUBINUR Negative 12/03/2017 1443   KETONESUR NEGATIVE  04/02/2019 1008   PROTEINUR 30 (A) 04/02/2019 1008   UROBILINOGEN 0.2 12/03/2017 1443   UROBILINOGEN 0.2 04/21/2010 1548   NITRITE NEGATIVE 04/02/2019 1008   LEUKOCYTESUR TRACE (A) 04/02/2019 1008  Sepsis Labs: @LABRCNTIP (procalcitonin:4,lacticidven:4)  ) Recent Results (from the past 240 hour(s))  Culture, Urine     Status: None   Collection Time: 04/02/19 10:08 AM   Specimen: Urine, Clean Catch  Result Value Ref Range Status   Specimen Description URINE, CLEAN CATCH  Final   Special Requests NONE  Final   Culture   Final    NO GROWTH Performed at [facility], [address] [place], [place] [address]    Report Status 04/03/2019 FINAL  Final  Novel Coronavirus, NAA (hospital order; send-out to ref lab)     Status: None   Collection Time: 04/02/19 12:32 PM   Specimen: Nasopharyngeal Swab; Respiratory  Result Value Ref Range Status   SARS-CoV-2, NAA NOT DETECTED NOT DETECTED Final    Comment: (NOTE) This test was developed and its performance characteristics determined by [employer]. This test has not been FDA cleared or approved. This test has been authorized by FDA under an Emergency Use Authorization (EUA). This test is only authorized for the duration of time the declaration that circumstances exist justifying the authorization of the emergency use of in vitro diagnostic tests for detection of SARS-CoV-2 virus and/or diagnosis of COVID-19 infection under section 564(b)(1) of the Act, 21 U.S.C. 606TKZ-6(W)(1), unless the authorization is terminated or revoked sooner. When diagnostic testing is negative, the possibility of a false negative result should be considered in the context of a patient's recent exposures and the presence of clinical signs and symptoms consistent with COVID-19. An individual without symptoms of COVID-19 and who is not shedding SARS-CoV-2 virus would expect to have a negative (not detected) result in this assay. Performed  At:  [facility] [address] [place], [place] [id number] Rush Farmer MD UK:[id number]    [place]  Final    Comment: Performed at [facility], [place] [address], [place], [place] [address]         Radiology Studies: No results found.      Scheduled Meds: . amoxicillin-clavulanate  1 tablet Oral Q12H  . diltiazem  60 mg Oral Q12H  . FLUoxetine  40 mg Oral Daily  . furosemide  40 mg Intravenous BID  . insulin aspart  0-5 Units Subcutaneous QHS  . insulin aspart  0-9 Units Subcutaneous TID WC  . levothyroxine  175 mcg Oral QAC breakfast  . mometasone-formoterol  2 puff Inhalation BID  . potassium chloride  40 mEq Oral Daily  . senna-docusate  1 tablet Oral QHS  . simvastatin  20 mg Oral q1800  . sodium chloride flush  3 mL Intravenous Q12H  . sorbitol  20 mL Oral Daily  . Warfarin - Pharmacist Dosing Inpatient   Does not apply q1800   Continuous Infusions:   LOS: 12 days   The patient is critically ill with multiple organ systems failure and requires high complexity decision making for assessment and support, frequent evaluation and titration of therapies, application of advanced monitoring technologies and extensive interpretation of multiple databases. Critical Care Time devoted to patient care services described in this note  Time spent: 40 minutes     Alyona Romack, Geraldo Docker, MD [facility] Pager [phone]  If 7PM-7AM, please contact night-coverage [email or web address] Password [facility] 04/09/2019, 7:35 AM

## 2019-04-10 ENCOUNTER — Ambulatory Visit: Payer: Medicare HMO | Admitting: Family Medicine

## 2019-04-10 DIAGNOSIS — N179 Acute kidney failure, unspecified: Secondary | ICD-10-CM

## 2019-04-10 DIAGNOSIS — I482 Chronic atrial fibrillation, unspecified: Secondary | ICD-10-CM

## 2019-04-10 DIAGNOSIS — A419 Sepsis, unspecified organism: Secondary | ICD-10-CM

## 2019-04-10 LAB — CBC
HCT: 28.9 % — ABNORMAL LOW (ref 36.0–46.0)
Hemoglobin: 8.5 g/dL — ABNORMAL LOW (ref 12.0–15.0)
MCH: 29.3 pg (ref 26.0–34.0)
MCHC: 29.4 g/dL — ABNORMAL LOW (ref 30.0–36.0)
MCV: 99.7 fL (ref 80.0–100.0)
Platelets: 417 10*3/uL — ABNORMAL HIGH (ref 150–400)
RBC: 2.9 MIL/uL — ABNORMAL LOW (ref 3.87–5.11)
RDW: 15.5 % (ref 11.5–15.5)
WBC: 9.3 10*3/uL (ref 4.0–10.5)
nRBC: 0 % (ref 0.0–0.2)

## 2019-04-10 LAB — GLUCOSE, CAPILLARY
Glucose-Capillary: 93 mg/dL (ref 70–99)
Glucose-Capillary: 95 mg/dL (ref 70–99)
Glucose-Capillary: 103 mg/dL — ABNORMAL HIGH (ref 70–99)
Glucose-Capillary: 102 mg/dL — ABNORMAL HIGH (ref 70–99)

## 2019-04-10 LAB — BASIC METABOLIC PANEL
Anion gap: 7 (ref 5–15)
BUN: 14 mg/dL (ref 8–23)
CO2: 31 mmol/L (ref 22–32)
Calcium: 10.5 mg/dL — ABNORMAL HIGH (ref 8.9–10.3)
Chloride: 96 mmol/L — ABNORMAL LOW (ref 98–111)
Creatinine, Ser: 0.9 mg/dL (ref 0.44–1.00)
GFR calc Af Amer: >60 mL/min (ref >60)
GFR calc non Af Amer: >60 mL/min (ref >60)
Glucose, Bld: 101 mg/dL — ABNORMAL HIGH (ref 70–99)
Potassium: 4.1 mmol/L (ref 3.5–5.1)
Sodium: 134 mmol/L — ABNORMAL LOW (ref 135–145)

## 2019-04-10 LAB — PROTIME-INR
INR: 1.9 — ABNORMAL HIGH (ref 0.8–1.2)
Prothrombin Time: 21.2 seconds — ABNORMAL HIGH (ref 11.4–15.2)

## 2019-04-10 LAB — MAGNESIUM: Magnesium: 2.1 mg/dL (ref 1.7–2.4)

## 2019-04-10 MED ORDER — OCUVITE-LUTEIN PO CAPS
1.0000 | ORAL_CAPSULE | Freq: Every day | ORAL | Status: DC
  Filled 2019-04-10 (×2): qty 1

## 2019-04-10 MED ORDER — WARFARIN SODIUM 2.5 MG PO TABS
2.5000 mg | ORAL_TABLET | Freq: Once | ORAL | Status: AC
  Administered 2019-04-10: 2.5 mg via ORAL
  Filled 2019-04-10: qty 1

## 2019-04-10 MED ORDER — PROSIGHT PO TABS
1.0000 | ORAL_TABLET | Freq: Every day | ORAL | Status: DC
  Administered 2019-04-10 – 2019-04-11 (×2): 1 via ORAL
  Filled 2019-04-10 (×2): qty 1

## 2019-04-10 MED ORDER — TRAMADOL HCL 50 MG PO TABS
50.0000 mg | ORAL_TABLET | Freq: Four times a day (QID) | ORAL | Status: DC | PRN
  Administered 2019-04-10 (×2): 50 mg via ORAL
  Filled 2019-04-10 (×2): qty 1

## 2019-04-10 MED ORDER — ENSURE ENLIVE PO LIQD
237.0000 mL | Freq: Two times a day (BID) | ORAL | Status: DC
  Administered 2019-04-11: 09:00:00 237 mL via ORAL

## 2019-04-10 MED ORDER — PANTOPRAZOLE SODIUM 40 MG PO TBEC
40.0000 mg | DELAYED_RELEASE_TABLET | Freq: Every day | ORAL | Status: DC
  Administered 2019-04-11: 40 mg via ORAL
  Filled 2019-04-10: qty 1

## 2019-04-10 MED ORDER — PRO-STAT SUGAR FREE PO LIQD
30.0000 mL | Freq: Two times a day (BID) | ORAL | Status: DC
  Administered 2019-04-10 – 2019-04-11 (×2): 30 mL via ORAL
  Filled 2019-04-10 (×2): qty 30

## 2019-04-10 MED ORDER — POLYSACCHARIDE IRON COMPLEX 150 MG PO CAPS
150.0000 mg | ORAL_CAPSULE | Freq: Two times a day (BID) | ORAL | Status: DC
  Administered 2019-04-10 – 2019-04-11 (×2): 150 mg via ORAL
  Filled 2019-04-10 (×4): qty 1

## 2019-04-10 MED ORDER — CICLOPIROX OLAMINE 0.77 % EX CREA: 1.0000 "application " | TOPICAL_CREAM | Freq: Two times a day (BID) | CUTANEOUS | Status: DC | PRN

## 2019-04-10 MED ORDER — SENNOSIDES-DOCUSATE SODIUM 8.6-50 MG PO TABS
1.0000 | ORAL_TABLET | Freq: Two times a day (BID) | ORAL | Status: DC
  Filled 2019-04-10 (×2): qty 1

## 2019-04-10 NOTE — Progress Notes (Signed)
Initial Nutrition Assessment RD working remotely.  DOCUMENTATION CODES:   Morbid obesity  INTERVENTION:  Ensure Enlive po BID, each supplement provides 350 kcal and 20 grams of protein Prostat po BID, each supplement provides 100 kcal and 15 grams of protein MVI   NUTRITION DIAGNOSIS:   Increased nutrient needs related to wound healing(cellulitis; open weeping wounds to BLE) as evidenced by estimated needs(HH/CHO diet order does not meet estimated protein needs).   GOAL:   Patient will meet greater than or equal to 90% of their needs   MONITOR:   PO intake, Supplement acceptance, Weight trends, Skin  REASON FOR ASSESSMENT:   Consult Assessment of nutrition requirement/status  ASSESSMENT:  63 year old female with medical history significant of A. Fib, CHF, COPD, chronic respiratory failure on 4L  O2 at home, morbid obesity, hypothyroidism, uncontrolled T2DM, chronic LLE wounds presented to ED with swelling, weeping wounds, and pain to BLE, x 4 days.   RD consulted for assessment of nutritional status. Per MD consult, patient is morbidly obese and not to exceed 2000 kcal/day. Pt diet order of HH/CHO mod provides 1647 kcal and 86 grams of protein daily.  Patient noted to have eaten 57.5% of 8 recorded meals - providing an average daily intake <1000 kcals and 49 grams of protein.   Patient would benefit from ONS to meet estimated kcal and protein needs. Current diet order does not meet increased protein needs estimated for wound healing.   RD unable to contact patient via phone to obtain home dietary intake/wt history.  Weights reviewed - 7/3: 152.4kg Edema present on admission  7/10:Patient with moderate pitting BUE, deep pitting BLE, mild pitting perineal;sacral edema    Medications reviewed and include: amoxicillin, Lasix, SSI, synthroid, potassium chloride 7mEq daily  Labs: Na 134 (L) Glucose 101 Lab Results  Component Value Date   HGBA1C 5.2 03/28/2019    Previous A1c values WDL  12/03/17: HGBA1c 5.7 11/13/16 HGBA1c 6.3  NUTRITION - FOCUSED PHYSICAL EXAM: Unable to access at this time  Diet Order:   Diet Order            Diet heart healthy/carb modified Room service appropriate? Yes; Fluid consistency: Thin; Fluid restriction: 1500 mL Fluid  Diet effective now        Diet - low sodium heart healthy              EDUCATION NEEDS:   Not appropriate for education at this time  Skin:  Skin Assessment: Reviewed RN Assessment(BLE; cellulitis; weeping, open wounds; rt foot; medial; anterior)  Last BM:  7/9 (type 6; type 7)  Height:   Ht Readings from Last 1 Encounters:  03/28/19 5\' 5"  (1.651 m)    Weight:   Wt Readings from Last 1 Encounters:  01/14/19 (!) 157.2 kg    Ideal Body Weight:  56.8 kg  BMI:  Body mass index is 55.91 kg/m.  Estimated Nutritional Needs: Based on IBW d/t presence of edema  Kcal:  1600-1800 (MSJ 1.4-1.6)  Protein:  102-114 (1.8-2.0g/kg/IBW)  Fluid:  1522mL per MD   Lajuan Lines, RD, LDN  After Hours/Weekend Pager: [phone]

## 2019-04-10 NOTE — Plan of Care (Signed)

## 2019-04-10 NOTE — Progress Notes (Addendum)
ANTICOAGULATION CONSULT NOTE - Follow Up Consult  Pharmacy Consult for warfarin Indication: atrial fibrillation and pulmonary embolus  Allergies  Allergen Reactions  . Pseudoephedrine Other (See Comments)    REACTION: tightness in chest  . Augmentin [Amoxicillin-Pot Clavulanate] Itching and Rash    Did it involve swelling of the face/tongue/throat, SOB, or low BP? No Did it involve sudden or severe rash/hives, skin peeling, or any reaction on the inside of your mouth or nose? Yes Did you need to seek medical attention at a hospital or doctor's office? Yes When did it last happen?2019 If all above answers are "NO", may proceed with cephalosporin use.   . Latex Itching and Rash    Patient Measurements: Height: 5\' 5"  (165.1 cm) Weight: (bed scale broken.  RN aware) IBW/kg (Calculated) : 57  Vital Signs: Temp: 98.8 F (37.1 C) (07/10 1113) Temp Source: Oral (07/10 1113) BP: 131/60 (07/10 0734) Pulse Rate: 74 (07/10 0734)  Labs: Recent Labs    04/08/19 0344 04/09/19 0321 04/10/19 0202  HGB 8.7* 8.6* 8.5*  HCT 29.1* 28.4* 28.9*  PLT 414* 398 417*  LABPROT 30.0* 25.6* 21.2*  INR 2.9* 2.4* 1.9*  CREATININE 1.05* 0.94 0.90    Estimated Creatinine Clearance: 96.2 mL/min (by C-G formula based on SCr of 0.9 mg/dL).   Medications:  Scheduled:  . amoxicillin-clavulanate  1 tablet Oral Q12H  . diltiazem  60 mg Oral Q12H  . FLUoxetine  40 mg Oral Daily  . furosemide  40 mg Intravenous BID  . insulin aspart  0-5 Units Subcutaneous QHS  . insulin aspart  0-9 Units Subcutaneous TID WC  . levothyroxine  175 mcg Oral QAC breakfast  . mometasone-formoterol  2 puff Inhalation BID  . potassium chloride  40 mEq Oral Daily  . senna-docusate  1 tablet Oral QHS  . simvastatin  20 mg Oral q1800  . sodium chloride flush  3 mL Intravenous Q12H  . sorbitol  20 mL Oral Daily  . Warfarin - Pharmacist Dosing Inpatient   Does not apply q1800    Assessment: 22 yof presented to the  ED with leg swelling. She is on chronic warfarin for history of afib and PE. PTA dose is 2.5mg  MWF, 5mg  TTSS.  Warfarin held d/t to supratherapeutic INR and restarted on 7/3. INR continued to trend up and warfarin was held again on 7/6 d/t INR of 3.5.   INR today is slightly sub-therapeutic at 1.9, CBC is stable with hgb 8.5, hct 28.9, plts 417. Per nursing no signs/symptoms of bleeding. No drug interactions currently affecting INR.        Goal of Therapy:  INR 2-3 Monitor platelets by anticoagulation protocol: Yes   Plan:  Warfarin 2.5 mg x 1  Check daily INR Continue to monitor CBC and s/sx of bleeding   Thank you,   Eddie Candle, PharmD PGY-1 Pharmacy Resident  Phone: [phone]

## 2019-04-10 NOTE — Progress Notes (Signed)
Patient refused use of CPAP for the evening 

## 2019-04-10 NOTE — Care Management (Signed)
Per CSW ED - pt still does not have insurance auth.  CSW following as agency will contact SW directly.

## 2019-04-10 NOTE — Progress Notes (Cosign Needed)
  Speech Language Pathology Treatment: Dysphagia  Patient Details Name: Amy Bryant MRN: [id number] DOB: 02/14/1956 Today's Date: 04/10/2019 Time: 3474-2595 SLP Time Calculation (min) (ACUTE ONLY): 8 min  Assessment / Plan / Recommendation Clinical Impression  Followed up for diet tolerance. Pt and RN report good tolerance of current diet and no difficulty with PO medicines. Reinforced safe swallowing strategies to maximize swallow safety including adequate positioning, smaller bites, sips. Pt agreeable to thin liquids only this date stating appetite is decreased in the am. No overt difficulty noted with thin liquids via straw sip. No further ST needs identified.    HPI HPI: Pt is a 63 y/o female with medical history significant of GERD, atrial fibrillation on Coumadin, obesity hypoventilation syndrome with CPAP at nighttime, chronic respiratory failure with chronic 4 L of nasal cannula use, morbid obesity, hypothyroidism, chronic pressure ulcer wounds on bilateral posterior thighs presented with sepsis, cellulitis, afib.  Pt started to develop increased confusion and there was concern for aspiration per MD, therefore SLP was ordered.      SLP Plan  Discharge SLP treatment due to (comment)(no further ST needs identified )       Recommendations  Diet recommendations: Regular;Thin liquid Liquids provided via: Cup;Straw Medication Administration: Whole meds with liquid Supervision: Patient able to self feed;Intermittent supervision to cue for compensatory strategies Compensations: Slow rate;Small sips/bites Postural Changes and/or Swallow Maneuvers: Seated upright 90 degrees;Upright 30-60 min after meal                Oral Care Recommendations: Oral care BID Follow up Recommendations: Skilled Nursing facility SLP Visit Diagnosis: Dysphagia, unspecified (R13.10) Plan: Discharge SLP treatment due to (comment)(no further ST needs identified )       GO                [name]  E Ethyl Vila MA, CCC-SLP Acute Rehabilitation Services 04/10/2019, 9:41 AM

## 2019-04-10 NOTE — Progress Notes (Signed)
PROGRESS NOTE    Amy Bryant  PPJ:[id number] DOB: Jan 06, 1956 DOA: 03/28/2019 PCP: Abner Greenspan, MD   Brief Narrative:  63 yo WF PMHx bipolar, diabetes type II uncontrolled with complication, COPD, OSA/OHS, chronic respiratory failure on 4 L home O2, chronic diastolic CHF (EF 60 to 58% January 09, 2019, atrial fibrillation CHaD2Vasc2 score=4 on Eliquis.  MORBID OBESITY, hypothyroidism, chronic LEFT lower extremity wounds, upper GI bleed  Presents with complaints swelling and pain in herbilateral lower extremities. Over the last 4 days she has had increased redness and erythema spreading up her right leg. She reports that she has several wounds on the leg that have been weeping foul-smelling fluid. She reports missing a couple days of her Lasix and has been sleeping in a recliner without elevating her legs. Other associated symptoms include nausea with decreased food intake, sore throat, burning with urination, and productive cough with greenish sputum production. Denies having any vomiting, fever, diarrhea   Assessment & Plan:   Principal Problem:   Acute on chronic respiratory failure with hypoxia (HCC) Active Problems:   Hypothyroidism   Diabetes type 2, controlled (HCC)   Morbid obesity (HCC)   Atrial fibrillation (HCC)   OSA (obstructive sleep apnea)   Cellulitis of right leg  1 acute on chronic respiratory failure with hypoxia secondary to aspiration pneumonia and acute on chronic diastolic heart failure/noncompliance with CPAP/COPD/A. fib Patient improving clinically.  Patient has been treated with antibiotics for aspiration pneumonia.  Currently on Dulera and albuterol nebs as needed.  Improving clinically.  Patient status post treatment for aspiration pneumonia.  Patient with a urine output of 2.675 L over the past 24 hours.  Patient is -19.366 L during this hospitalization.  Continue IV Lasix and likely transition to oral Lasix tomorrow.  2.  Aspiration pneumonia Status  post antibiotic treatment.  Improved clinically.  Continue albuterol and Dulera.  3.  Acute on chronic diastolic heart failure Improving clinically.  Patient with a urine output of 2.675 L over the past 24 hours.  Patient is -19.366 L during this hospitalization.  Continue IV Lasix and likely transition to oral Lasix tomorrow.  Continue Cardizem.  Follow.  4.  Sepsis/right lower extremity cellulitis Cellulitis improving significantly.  Patient has completed course of IV antibiotics currently on oral antibiotics for cellulitis for additional coverage.  Continue current wound care.  Status post lanced blebs by [facility] nurse of the right lower extremity on 03/29/2019.  Continue current dressing changes as recommended by wound care nurse.  5.  Obstructive sleep apnea/OHS CPAP nightly.  Per RN notes is noted that patient has been refusing CPAP.  6.  COPD Stable.  Continue Dulera and albuterol as needed.  7.  Chronic atrial fibrillation Currently rate controlled on Cardizem 60 mg twice daily.  INR at 1.9.  Coumadin per pharmacy.  8.  Hypomagnesemia Magnesium at 2.1.  Follow.  Cute kidney injury Resolved.  10.  Diabetes mellitus type 2 Hemoglobin A1c 5.2 on 03/28/2019.  CBG of 93 this morning.  Sliding scale insulin.  11.  Hypothyroidism Continue Synthroid.  12.  Normocytic anemia H&H stable.  Follow.  13.  Morbid obesity   DVT prophylaxis: Coumadin Code Status: Full Family Communication: Updated patient.  No family at bedside. Disposition Plan: To skilled nursing facility when clinically improved hopefully by Monday, 04/13/2019   Consultants:   Infectious disease Dr. Megan Salon 04/02/2019  Wound care  Procedures:   CT right foot 03/30/2019  CTs renal stone protocol 03/28/2019  Chest x-ray 03/28/2019, 04/02/2019, 04/03/2019  Plain films of the right foot 03/30/2019  Antimicrobials:   Augmentin 04/06/2019>>>>> 04/13/2019  IV Unasyn 04/02/2019>>>>> 04/06/2019  Keflex 04/02/2019>>>> 04/03/2019   Doxycycline 04/02/2019>>>> 04/02/2019  IV cefepime 04/01/2019>>>> 04/02/2019  IV Flagyl 04/01/2019>>>> 04/02/2019  IV vancomycin 03/31/2019>>>> 04/02/2019  IV Ancef 03/28/2019>>>> 03/31/2019   Subjective: Patient feeling better.  States improving shortness of breath.  Improving lower extremity cellulitis.  Denies any chest pain.  Objective: Vitals:   04/09/19 2345 04/10/19 0356 04/10/19 0734 04/10/19 0815  BP: 120/61 (!) 115/45 131/60   Pulse: 79 76 74   Resp: 19 17 17    Temp: 98.6 F (37 C) 98.2 F (36.8 C) 98.2 F (36.8 C)   TempSrc: Oral Oral Oral   SpO2: 96% 99% 96% 96%  Weight:      Height:        Intake/Output Summary (Last 24 hours) at 04/10/2019 1053 Last data filed at 04/10/2019 0734 Gross per 24 hour  Intake 354 ml  Output 2075 ml  Net -1721 ml   Filed Weights   04/03/19 0500  Weight: (!) 152.4 kg    Examination:  General exam: Appears calm and comfortable  Respiratory system: Clear to auscultation. Respiratory effort normal. Cardiovascular system: S1 & S2 heard, RRR. No JVD, murmurs, rubs, gallops or clicks. No pedal edema. Gastrointestinal system: Abdomen is nondistended, soft and nontender. No organomegaly or masses felt. Normal bowel sounds heard. Central nervous system: Alert and oriented. No focal neurological deficits. Extremities: Symmetric 5 x 5 power. Skin: Bilateral lower extremities with improvement with erythema.  Right lower extremity wrapped.  Psychiatry: Judgement and insight appear normal. Mood & affect appropriate.     Data Reviewed: I have personally reviewed following labs and imaging studies  CBC: Recent Labs  Lab 04/04/19 0136 04/05/19 0304 04/06/19 0247 04/08/19 0344 04/09/19 0321 04/10/19 0202  WBC 14.9* 14.4* 9.8 9.2 7.7 9.3  NEUTROABS 12.3* 12.1* 8.1* 7.5  --   --   HGB 8.3* 8.8* 8.5* 8.7* 8.6* 8.5*  HCT 27.3* 29.1* 28.5* 29.1* 28.4* 28.9*  MCV 97.8 98.6 99.0 99.0 99.0 99.7  PLT 440* 455* 454* 414* 398 417*   Basic Metabolic  Panel: Recent Labs  Lab 04/05/19 0304 04/06/19 0247 04/08/19 0344 04/09/19 0321 04/10/19 0202  NA 137 140 134* 136 134*  K 3.7 4.1 4.2 4.3 4.1  CL 101 102 95* 97* 96*  CO2 29 31 33* 30 31  GLUCOSE 109* 105* 99 96 101*  BUN 11 12 15 14 14   CREATININE 0.78 0.80 1.05* 0.94 0.90  CALCIUM 9.7 9.7 10.0 10.1 10.5*  MG  --   --   --  1.8 2.1   GFR: Estimated Creatinine Clearance: 96.2 mL/min (by C-G formula based on SCr of 0.9 mg/dL). Liver Function Tests: Recent Labs  Lab 04/04/19 0136 04/05/19 0304 04/06/19 0247  AST 38 36 39  ALT 12 15 16   ALKPHOS 145* 137* 126  BILITOT 0.8 0.5 0.6  PROT 6.0* 6.4* 6.3*  ALBUMIN 1.7* 1.7* 1.7*   No results for input(s): LIPASE, AMYLASE in the last 168 hours. No results for input(s): AMMONIA in the last 168 hours. Coagulation Profile: Recent Labs  Lab 04/06/19 0247 04/07/19 0231 04/08/19 0344 04/09/19 0321 04/10/19 0202  INR 3.5* 3.3* 2.9* 2.4* 1.9*   Cardiac Enzymes: No results for input(s): CKTOTAL, CKMB, CKMBINDEX, TROPONINI in the last 168 hours. BNP (last 3 results) No results for input(s): PROBNP in the last 8760 hours. HbA1C: No  results for input(s): HGBA1C in the last 72 hours. CBG: Recent Labs  Lab 04/09/19 0618 04/09/19 1052 04/09/19 1659 04/09/19 2135 04/10/19 0643  GLUCAP 92 105* 133* 114* 93   Lipid Profile: No results for input(s): CHOL, HDL, LDLCALC, TRIG, CHOLHDL, LDLDIRECT in the last 72 hours. Thyroid Function Tests: No results for input(s): TSH, T4TOTAL, FREET4, T3FREE, THYROIDAB in the last 72 hours. Anemia Panel: Recent Labs    04/09/19 0321  VITAMINB12 254  FOLATE 8.5  FERRITIN 82  TIBC 207*  IRON 38  RETICCTPCT 2.1   Sepsis Labs: Recent Labs  Lab 04/03/19 1150 04/03/19 1332 04/04/19 0136 04/05/19 0304  PROCALCITON 0.47  --  0.42 0.25  LATICACIDVEN 1.0 1.4  --   --     Recent Results (from the past 240 hour(s))  Culture, Urine     Status: None   Collection Time: 04/02/19 10:08 AM    Specimen: Urine, Clean Catch  Result Value Ref Range Status   Specimen Description URINE, CLEAN CATCH  Final   Special Requests NONE  Final   Culture   Final    NO GROWTH Performed at [facility], [address] [place], [place] [address]    Report Status 04/03/2019 FINAL  Final  Novel Coronavirus, NAA (hospital order; send-out to ref lab)     Status: None   Collection Time: 04/02/19 12:32 PM   Specimen: Nasopharyngeal Swab; Respiratory  Result Value Ref Range Status   SARS-CoV-2, NAA NOT DETECTED NOT DETECTED Final    Comment: (NOTE) This test was developed and its performance characteristics determined by [facility]. This test has not been FDA cleared or approved. This test has been authorized by FDA under an Emergency Use Authorization (EUA). This test is only authorized for the duration of time the declaration that circumstances exist justifying the authorization of the emergency use of in vitro diagnostic tests for detection of SARS-CoV-2 virus and/or diagnosis of COVID-19 infection under section 564(b)(1) of the Act, 21 U.S.C. 213YQM-5(H)(8360bbb-3(b)(1), unless the authorization is terminated or revoked sooner. When diagnostic testing is negative, the possibility of a false negative result should be considered in the context of a patient's recent exposures and the presence of clinical signs and symptoms consistent with COVID-19. An individual without symptoms of COVID-19 and who is not shedding SARS-CoV-2 virus would expect to have a negative (not detected) result in this assay. Performed  At: [facility] [address] [place], [place] [id number] Jolene SchimkeNagendra Sanjai MD UX:[id number]:[phone]    Coronavirus Source NASOPHARYNGEAL  Final    Comment: Performed at [facility], [address] [place], [place] [address]  Novel Coronavirus,NAA,(SEND-OUT TO REF LAB - TAT 24-48 hrs); Hosp Order     Status: None   Collection Time: 04/08/19  6:44 PM   Specimen:  Nasopharyngeal Swab; Respiratory  Result Value Ref Range Status   SARS-CoV-2, NAA NOT DETECTED NOT DETECTED Final    Comment: (NOTE) This test was developed and its performance characteristics determined by [facility]. This test has not been FDA cleared or approved. This test has been authorized by FDA under an Emergency Use Authorization (EUA). This test is only authorized for the duration of time the declaration that circumstances exist justifying the authorization of the emergency use of in vitro diagnostic tests for detection of SARS-CoV-2 virus and/or diagnosis of COVID-19 infection under section 564(b)(1) of the Act, 21 U.S.C. 403KVQ-2(V)(9360bbb-3(b)(1), unless the authorization is terminated or revoked sooner. When diagnostic testing is negative, the  possibility of a false negative result should be considered in the context of a patient's recent exposures and the presence of clinical signs and symptoms consistent with COVID-19. An individual without symptoms of COVID-19 and who is not shedding SARS-CoV-2 virus would expect to have a negative (not detected) result in this assay. Performed  At: [facility] [address] [place], [place] [id number] Jolene SchimkeNagendra Sanjai MD JX:[id number]:[phone]    Coronavirus Source NASOPHARYNGEAL  Final    Comment: Performed at [facility], [address] [place], [place] [address]         Radiology Studies: No results found.      Scheduled Meds: . amoxicillin-clavulanate  1 tablet Oral Q12H  . diltiazem  60 mg Oral Q12H  . FLUoxetine  40 mg Oral Daily  . furosemide  40 mg Intravenous BID  . insulin aspart  0-5 Units Subcutaneous QHS  . insulin aspart  0-9 Units Subcutaneous TID WC  . levothyroxine  175 mcg Oral QAC breakfast  . mometasone-formoterol  2 puff Inhalation BID  . potassium chloride  40 mEq Oral Daily  . senna-docusate  1 tablet Oral QHS  . simvastatin  20 mg Oral q1800  . sodium chloride flush  3 mL Intravenous  Q12H  . sorbitol  20 mL Oral Daily  . Warfarin - Pharmacist Dosing Inpatient   Does not apply q1800   Continuous Infusions:   LOS: 13 days    Time spent: 40 minutes    Ramiro Harvestaniel Thompson, MD [facility]  If 7PM-7AM, please contact night-coverage [email or web address] 04/10/2019, 10:53 AM

## 2019-04-11 DIAGNOSIS — Z7901 Long term (current) use of anticoagulants: Secondary | ICD-10-CM | POA: Diagnosis not present

## 2019-04-11 DIAGNOSIS — E43 Unspecified severe protein-calorie malnutrition: Secondary | ICD-10-CM | POA: Diagnosis not present

## 2019-04-11 DIAGNOSIS — R0902 Hypoxemia: Secondary | ICD-10-CM | POA: Diagnosis not present

## 2019-04-11 DIAGNOSIS — M6281 Muscle weakness (generalized): Secondary | ICD-10-CM | POA: Diagnosis not present

## 2019-04-11 DIAGNOSIS — Z79899 Other long term (current) drug therapy: Secondary | ICD-10-CM | POA: Diagnosis not present

## 2019-04-11 DIAGNOSIS — F329 Major depressive disorder, single episode, unspecified: Secondary | ICD-10-CM | POA: Diagnosis not present

## 2019-04-11 DIAGNOSIS — L03115 Cellulitis of right lower limb: Secondary | ICD-10-CM | POA: Diagnosis not present

## 2019-04-11 DIAGNOSIS — A419 Sepsis, unspecified organism: Secondary | ICD-10-CM | POA: Diagnosis not present

## 2019-04-11 DIAGNOSIS — E039 Hypothyroidism, unspecified: Secondary | ICD-10-CM | POA: Diagnosis not present

## 2019-04-11 DIAGNOSIS — R2689 Other abnormalities of gait and mobility: Secondary | ICD-10-CM | POA: Diagnosis not present

## 2019-04-11 DIAGNOSIS — J690 Pneumonitis due to inhalation of food and vomit: Secondary | ICD-10-CM | POA: Diagnosis present

## 2019-04-11 DIAGNOSIS — N179 Acute kidney failure, unspecified: Secondary | ICD-10-CM | POA: Diagnosis not present

## 2019-04-11 DIAGNOSIS — I5033 Acute on chronic diastolic (congestive) heart failure: Secondary | ICD-10-CM

## 2019-04-11 DIAGNOSIS — I2699 Other pulmonary embolism without acute cor pulmonale: Secondary | ICD-10-CM | POA: Diagnosis not present

## 2019-04-11 DIAGNOSIS — D649 Anemia, unspecified: Secondary | ICD-10-CM | POA: Diagnosis not present

## 2019-04-11 DIAGNOSIS — M2550 Pain in unspecified joint: Secondary | ICD-10-CM | POA: Diagnosis not present

## 2019-04-11 DIAGNOSIS — I482 Chronic atrial fibrillation, unspecified: Secondary | ICD-10-CM | POA: Diagnosis not present

## 2019-04-11 DIAGNOSIS — J9621 Acute and chronic respiratory failure with hypoxia: Secondary | ICD-10-CM | POA: Diagnosis not present

## 2019-04-11 DIAGNOSIS — I469 Cardiac arrest, cause unspecified: Secondary | ICD-10-CM | POA: Diagnosis not present

## 2019-04-11 DIAGNOSIS — D509 Iron deficiency anemia, unspecified: Secondary | ICD-10-CM | POA: Diagnosis not present

## 2019-04-11 DIAGNOSIS — R42 Dizziness and giddiness: Secondary | ICD-10-CM | POA: Diagnosis not present

## 2019-04-11 DIAGNOSIS — R300 Dysuria: Secondary | ICD-10-CM | POA: Diagnosis not present

## 2019-04-11 DIAGNOSIS — R488 Other symbolic dysfunctions: Secondary | ICD-10-CM | POA: Diagnosis not present

## 2019-04-11 DIAGNOSIS — Z7401 Bed confinement status: Secondary | ICD-10-CM | POA: Diagnosis not present

## 2019-04-11 DIAGNOSIS — J9620 Acute and chronic respiratory failure, unspecified whether with hypoxia or hypercapnia: Secondary | ICD-10-CM | POA: Diagnosis not present

## 2019-04-11 DIAGNOSIS — K219 Gastro-esophageal reflux disease without esophagitis: Secondary | ICD-10-CM | POA: Diagnosis not present

## 2019-04-11 DIAGNOSIS — E1121 Type 2 diabetes mellitus with diabetic nephropathy: Secondary | ICD-10-CM | POA: Diagnosis not present

## 2019-04-11 DIAGNOSIS — J9600 Acute respiratory failure, unspecified whether with hypoxia or hypercapnia: Secondary | ICD-10-CM | POA: Diagnosis not present

## 2019-04-11 DIAGNOSIS — T7840XA Allergy, unspecified, initial encounter: Secondary | ICD-10-CM | POA: Diagnosis not present

## 2019-04-11 DIAGNOSIS — R110 Nausea: Secondary | ICD-10-CM | POA: Diagnosis not present

## 2019-04-11 DIAGNOSIS — G4733 Obstructive sleep apnea (adult) (pediatric): Secondary | ICD-10-CM | POA: Diagnosis not present

## 2019-04-11 LAB — CBC
HCT: 31.4 % — ABNORMAL LOW (ref 36.0–46.0)
Hemoglobin: 9.2 g/dL — ABNORMAL LOW (ref 12.0–15.0)
MCH: 29.9 pg (ref 26.0–34.0)
MCHC: 29.3 g/dL — ABNORMAL LOW (ref 30.0–36.0)
MCV: 101.9 fL — ABNORMAL HIGH (ref 80.0–100.0)
Platelets: 459 10*3/uL — ABNORMAL HIGH (ref 150–400)
RBC: 3.08 MIL/uL — ABNORMAL LOW (ref 3.87–5.11)
RDW: 15.5 % (ref 11.5–15.5)
WBC: 8.9 10*3/uL (ref 4.0–10.5)
nRBC: 0 % (ref 0.0–0.2)

## 2019-04-11 LAB — GLUCOSE, CAPILLARY
Glucose-Capillary: 93 mg/dL (ref 70–99)
Glucose-Capillary: 100 mg/dL — ABNORMAL HIGH (ref 70–99)

## 2019-04-11 LAB — BASIC METABOLIC PANEL
Anion gap: 11 (ref 5–15)
BUN: 18 mg/dL (ref 8–23)
CO2: 26 mmol/L (ref 22–32)
Calcium: 11.1 mg/dL — ABNORMAL HIGH (ref 8.9–10.3)
Chloride: 96 mmol/L — ABNORMAL LOW (ref 98–111)
Creatinine, Ser: 0.99 mg/dL (ref 0.44–1.00)
GFR calc Af Amer: >60 mL/min (ref >60)
GFR calc non Af Amer: >60 mL/min (ref >60)
Glucose, Bld: 98 mg/dL (ref 70–99)
Potassium: 4.3 mmol/L (ref 3.5–5.1)
Sodium: 133 mmol/L — ABNORMAL LOW (ref 135–145)

## 2019-04-11 LAB — PROTIME-INR
INR: 1.7 — ABNORMAL HIGH (ref 0.8–1.2)
Prothrombin Time: 19.8 seconds — ABNORMAL HIGH (ref 11.4–15.2)

## 2019-04-11 LAB — MAGNESIUM: Magnesium: 2.1 mg/dL (ref 1.7–2.4)

## 2019-04-11 MED ORDER — TRAMADOL HCL 50 MG PO TABS: 50.0000 mg | ORAL_TABLET | Freq: Four times a day (QID) | ORAL | 0 refills | Status: DC | PRN

## 2019-04-11 MED ORDER — ENSURE ENLIVE PO LIQD: 237.0000 mL | Freq: Two times a day (BID) | ORAL | 0 refills | Status: DC

## 2019-04-11 MED ORDER — SENNOSIDES-DOCUSATE SODIUM 8.6-50 MG PO TABS: 1.0000 | ORAL_TABLET | Freq: Two times a day (BID) | ORAL | Status: AC

## 2019-04-11 MED ORDER — PROSIGHT PO TABS: 1.0000 | ORAL_TABLET | Freq: Every day | ORAL | 0 refills | Status: DC

## 2019-04-11 MED ORDER — PRO-STAT SUGAR FREE PO LIQD: 30.0000 mL | Freq: Two times a day (BID) | ORAL | 0 refills | Status: DC

## 2019-04-11 MED ORDER — GERHARDT'S BUTT CREAM: 1.0000 "application " | TOPICAL_CREAM | Freq: Three times a day (TID) | CUTANEOUS | 0 refills | Status: AC | PRN

## 2019-04-11 MED ORDER — WARFARIN SODIUM 5 MG PO TABS: 5.0000 mg | ORAL_TABLET | Freq: Once | ORAL | Status: DC

## 2019-04-11 MED ORDER — WARFARIN SODIUM 5 MG PO TABS: 2.5000 mg | ORAL_TABLET | Freq: Every day | ORAL | 0 refills | Status: DC

## 2019-04-11 MED ORDER — AMOXICILLIN-POT CLAVULANATE 875-125 MG PO TABS: 1.0000 | ORAL_TABLET | Freq: Two times a day (BID) | ORAL | 0 refills | Status: AC

## 2019-04-11 NOTE — Progress Notes (Signed)
ANTICOAGULATION CONSULT NOTE - Follow Up Consult  Pharmacy Consult for warfarin Indication: atrial fibrillation and pulmonary embolus  Allergies  Allergen Reactions  . Pseudoephedrine Other (See Comments)    REACTION: tightness in chest  . Augmentin [Amoxicillin-Pot Clavulanate] Itching and Rash    Did it involve swelling of the face/tongue/throat, SOB, or low BP? No Did it involve sudden or severe rash/hives, skin peeling, or any reaction on the inside of your mouth or nose? Yes Did you need to seek medical attention at a hospital or doctor's office? Yes When did it last happen?2019 If all above answers are "NO", may proceed with cephalosporin use.   . Latex Itching and Rash    Patient Measurements: Height: 5\' 5"  (165.1 cm) Weight: (!) 400 lb (181.4 kg) IBW/kg (Calculated) : 57  Vital Signs: Temp: 97.8 F (36.6 C) (07/11 1117) Temp Source: Oral (07/11 1117) BP: 122/61 (07/11 1117) Pulse Rate: 24 (07/11 1117)  Labs: Recent Labs    04/09/19 0321 04/10/19 0202 04/11/19 0220  HGB 8.6* 8.5* 9.2*  HCT 28.4* 28.9* 31.4*  PLT 398 417* 459*  LABPROT 25.6* 21.2* 19.8*  INR 2.4* 1.9* 1.7*  CREATININE 0.94 0.90 0.99    Estimated Creatinine Clearance: 98.1 mL/min (by C-G formula based on SCr of 0.99 mg/dL).   Medications:  Scheduled:  . amoxicillin-clavulanate  1 tablet Oral Q12H  . diltiazem  60 mg Oral Q12H  . feeding supplement (ENSURE ENLIVE)  237 mL Oral BID BM  . feeding supplement (PRO-STAT SUGAR FREE 64)  30 mL Oral BID  . FLUoxetine  40 mg Oral Daily  . furosemide  40 mg Intravenous BID  . insulin aspart  0-5 Units Subcutaneous QHS  . insulin aspart  0-9 Units Subcutaneous TID WC  . iron polysaccharides  150 mg Oral BID  . levothyroxine  175 mcg Oral QAC breakfast  . mometasone-formoterol  2 puff Inhalation BID  . multivitamin  1 tablet Oral Daily  . pantoprazole  40 mg Oral Q0600  . potassium chloride  40 mEq Oral Daily  . senna-docusate  1 tablet  Oral BID  . simvastatin  20 mg Oral q1800  . sodium chloride flush  3 mL Intravenous Q12H  . sorbitol  20 mL Oral Daily  . Warfarin - Pharmacist Dosing Inpatient   Does not apply q1800    Assessment: 56 yof presented to the ED with leg swelling. She is on chronic warfarin for history of afib and PE. PTA dose is 2.5mg  MWF, 5mg  TTSS.  Warfarin held d/t to supratherapeutic INR and restarted on 7/3. INR continued to trend up and warfarin was held again on 7/6 d/t INR of 3.5.   INR today is slightly sub-therapeutic at 1.7, CBC is stable with hgb 9.2, plts 459.   Goal of Therapy:  INR 2-3 Monitor platelets by anticoagulation protocol: Yes   Plan:  Warfarin 5 mg x 1  Check daily INR Continue to monitor CBC and s/sx of bleeding   **If discharged today, recommend 5 mg x 1 tonight, then 2.5 mg tomorrow, and check INR on Monday morning at facility  Vertis Kelch, PharmD PGY2 Cardiology Pharmacy Resident Phone [phone] 04/11/2019       1:46 PM  Please check [email or web address] for unit-specific pharmacist phone numbers

## 2019-04-11 NOTE — Discharge Summary (Signed)
Physician Discharge Summary  Amy Bryant ZOX:[id number] DOB: 20-Nov-1955 DOA: 03/28/2019  PCP: Judy Pimple, MD  Admit date: 03/28/2019 Discharge date: 04/11/2019  Time spent: 60 minutes  Recommendations for Outpatient Follow-up:  1. Follow-up with MD at skilled nursing facility.  Patient will need a PT/INR checked on Monday, 04/13/2019 and further recommendations will need to be made for patient's Coumadin dosing.  Patient will need a basic metabolic profile done in 1 week to follow-up on electrolytes and renal function.   Discharge Diagnoses:  Principal Problem:   Acute on chronic respiratory failure with hypoxia (HCC) Active Problems:   Hypothyroidism   Diabetes type 2, controlled (HCC)   Morbid obesity (HCC)   Atrial fibrillation (HCC)   OSA (obstructive sleep apnea)   Cellulitis of right leg   Sepsis with acute renal failure (HCC)   Aspiration pneumonia (HCC)   Acute on chronic diastolic CHF (congestive heart failure) (HCC)   Cellulitis of right lower extremity   Discharge Condition: Stable and improved  Diet recommendation: Heart healthy  Filed Weights   04/10/19 1423  Weight: (!) 181.4 kg    History of present illness:  HPI per Dr. Debbe Mounts is a 63 y.o. female with medical history significant of A. fib on Eliquis, CHF, COPD, chronic respiratory failure on 4 L, morbid obesity, and hypothyroidism; who presents with complaints swelling and pain in her bilateral lower extremities.  Over the last 4 days she has had increased redness and erythema spreading up her right leg.  She reports that she has several wounds on the leg that have been weeping foul-smelling fluid.  She reports missing a couple days of her Lasix and has been sleeping in a recliner without elevating her legs.  Other associated symptoms include nausea with decreased food intake, sore throat, burning with urination, and productive cough with greenish sputum production.  Denies having any vomiting,  fever, diarrhea  ED Course: Upon admission to the emergency department patient was seen to have a temperature of 99.4 F, pulse 96-99, respiration 19-30, blood pressure 90 over 55-105/36, and O2 saturations maintained on room air.  Labs significant for WBC 20.8, hemoglobin 10.5, BUN 56, creatinine 2.25, INR 3.4, and initial lactic acid 1.5.  Chest x-ray showing left-sided atelectasis with cardiomegaly without signs of consolidation or edema.  Sepsis protocol initiated with patient given 1.8 L of normal saline IV fluids and empiric antibiotics of Ancef.  Hospital Course:  1 acute on chronic respiratory failure with hypoxia secondary to aspiration pneumonia and acute on chronic diastolic heart failure/noncompliance with CPAP/COPD/A. fib Patient improving clinically.  Patient has been treated with antibiotics for aspiration pneumonia.  Patient was subsequently placed [facility] and albuterol nebs as needed.  Patient status post treatment for aspiration pneumonia.  Patient was also diuresed with IV Lasix with good urine output.  Patient was -20.486 L during this hospitalization.  Patient improved clinically.  IV Lasix transition back to home dose oral Lasix on discharge.  Patient be discharged with 2 more days of oral antibiotics.  Patient will be discharged to skilled nursing facility in stable and improved condition.   2.  Aspiration pneumonia Status post antibiotic treatment.  Improved clinically.  Was maintained on Dulera and albuterol.   3.  Acute on chronic diastolic heart failure Improving clinically.    Patient was placed on IV Lasix with good urine output during the hospitalization and clinical improvement.  Patient was -20.486 L during this hospitalization.  Patient will be transition  back to home dose oral Lasix on discharge.  Patient also maintained on Cardizem during the hospitalization.  Outpatient follow-up.    4.  Sepsis/right lower extremity cellulitis Cellulitis improved significantly  during the hospitalization.  Patient has completed course of IV antibiotics currently on oral antibiotics for cellulitis for additional coverage.  Continue current wound care.  Status [facility] right lower extremity on 03/29/2019.  Patient was maintained on dressing changes as recommended by wound care nurse.  5.  Obstructive sleep apnea/OHS CPAP nightly.  Per RN notes is noted that patient has been refusing CPAP.  6.  COPD Patient maintained on Dulera and albuterol as needed during the hospitalization.  7.  Chronic atrial fibrillation Remained controlled on Cardizem 60 mg twice daily.  Coumadin per pharmacy.  Patient will be discharged on Coumadin 5 mg on 04/11/2019, then 2.5 mg daily thereafter until PT/INR is checked on Monday, 04/13/2019 and further recommendations given.  8.  Hypomagnesemia Magnesium at 2.1.  Follow.  9.  Acute kidney injury Resolved.  10.  Diabetes mellitus type 2 Hemoglobin A1c 5.2 on 03/28/2019.    Patient's oral hypoglycemic agents were held during the hospitalization and patient maintained on sliding scale insulin.  Oral hypoglycemic agents will be resumed on discharge.   11.  Hypothyroidism Patient was maintained on home regimen of Synthroid.  12.  Normocytic anemia H&H stable.  Follow.  13.  Morbid obesity   Procedures:  CT right foot 03/30/2019  CTs renal stone protocol 03/28/2019  Chest x-ray 03/28/2019, 04/02/2019, 04/03/2019  Plain films of the right foot 03/30/2019   Consultations:  Infectious disease Dr. Orvan Falconerampbell 04/02/2019  Wound care   Discharge Exam: Vitals:   04/11/19 0921 04/11/19 1117  BP: (!) 122/55 122/61  Pulse:  (!) 24  Resp:  18  Temp:  97.8 F (36.6 C)  SpO2:      General: NAD Cardiovascular: RRR Respiratory: CTAB  Discharge Instructions   Discharge Instructions    Diet - low sodium heart healthy   Complete by: As directed    For home use only DME Hospital bed   Complete by: As  directed    Length of Need: Lifetime   The above medical condition requires: Patient requires the ability to reposition frequently   Head must be elevated greater than: 45 degrees   Bed type: Heavy-duty, semi-electric (for patients >350 lbs.)   Hoyer Lift: Yes   Trapeze Bar: Yes   Support Surface: Gel Overlay   Increase activity slowly   Complete by: As directed    Increase activity slowly   Complete by: As directed      Allergies as of 04/11/2019      Reactions   Pseudoephedrine Other (See Comments)   REACTION: tightness in chest   Augmentin [amoxicillin-pot Clavulanate] Itching, Rash   Did it involve swelling of the face/tongue/throat, SOB, or low BP? No Did it involve sudden or severe rash/hives, skin peeling, or any reaction on the inside of your mouth or nose? Yes Did you need to seek medical attention at a hospital or doctor's office? Yes When did it last happen?2019 If all above answers are "NO", may proceed with cephalosporin use.   Latex Itching, Rash      Medication List    STOP taking these medications   diclofenac 75 MG EC tablet Commonly known as: VOLTAREN   diltiazem 120 MG 24 hr capsule Commonly known as: TIAZAC     TAKE these  medications   albuterol (2.5 MG/3ML) 0.083% nebulizer solution Commonly known as: PROVENTIL INHALE 1 VIAL VIA NEBULIZER EVERY 4 HOURS AS NEEDED FOR WHEEZING   ALPRAZolam 0.5 MG tablet Commonly known as: Xanax Take 1 tablet (0.5 mg total) by mouth 2 (two) times daily as needed for anxiety (panic attack).   amoxicillin-clavulanate 875-125 MG tablet Commonly known as: AUGMENTIN Take 1 tablet by mouth every 12 (twelve) hours for 2 days.   budesonide-formoterol 160-4.5 MCG/ACT inhaler Commonly known as: Symbicort INHALE 2 PUFFS INTO LUNGS TWICE A DAY What changed:   how much to take  how to take this  when to take this  additional instructions   cholecalciferol 1000 units tablet Commonly known as: VITAMIN D Take  4,000 Units by mouth daily.   ciclopirox 0.77 % cream Commonly known as: LOPROX APPLY TO AFFECTED AREA TWICE A DAY AS NEEDED What changed:   how much to take  how to take this  when to take this  reasons to take this  additional instructions   diltiazem 120 MG 24 hr capsule Commonly known as: Cartia XT Take 1 capsule (120 mg total) by mouth daily.   Elocon 0.1 % cream Generic drug: mometasone Apply 1 application topically See admin instructions. Tiny amount to each ear canal twice weekly as needed.   feeding supplement (ENSURE ENLIVE) Liqd Take 237 mLs by mouth 2 (two) times daily between meals.   feeding supplement (PRO-STAT SUGAR FREE 64) Liqd Take 30 mLs by mouth 2 (two) times daily.   FLUoxetine 40 MG capsule Commonly known as: PROZAC Take 1 capsule (40 mg total) by mouth daily.   furosemide 80 MG tablet Commonly known as: LASIX TAKE 1 TABLET (80 MG TOTAL) BY MOUTH 2 (TWO) TIMES DAILY.   Gerhardt's butt cream Crea Apply 1 application topically 3 (three) times daily as needed for irritation.   glipiZIDE 5 MG 24 hr tablet Commonly known as: GLUCOTROL XL Take 1 tablet (5 mg total) by mouth daily with breakfast.   glucose blood test strip Test blood sugar once daily and as needed for DM 250.0   iron polysaccharides 150 MG capsule Commonly known as: Nu-Iron Take 1 capsule (150 mg total) by mouth 2 (two) times daily.   levothyroxine 175 MCG tablet Commonly known as: SYNTHROID Take 1 tablet (175 mcg total) by mouth daily before breakfast.   metFORMIN 1000 MG tablet Commonly known as: GLUCOPHAGE TAKE 1 TABLET BY MOUTH TWICE DAILY WITH A MEAL What changed: See the new instructions.   multivitamin Tabs tablet Take 1 tablet by mouth daily. Start taking on: April 12, 2019   mupirocin ointment 2 % Commonly known as: BACTROBAN APPLY TO AFFECTED AREA TWICE A DAY What changed:   how much to take  how to take this  when to take this  reasons to take  this  additional instructions   onetouch ultrasoft lancets Test blood sugar once daily and as needed for DM 250.0   pantoprazole 40 MG tablet Commonly known as: PROTONIX Take 1 tablet (40 mg total) by mouth daily at 6 (six) AM.   potassium chloride SA 20 MEQ tablet Commonly known as: Klor-Con M20 TAKE 2 TABLETS BY MOUTH EVERY DAY What changed:   how much to take  how to take this  when to take this  additional instructions   senna-docusate 8.6-50 MG tablet Commonly known as: Senokot-S Take 1 tablet by mouth 2 (two) times daily.   simvastatin 20 MG tablet Commonly known as:  ZOCOR TAKE 1 TABLET BY MOUTH EVERYDAY AT BEDTIME What changed: See the new instructions.   traMADol 50 MG tablet Commonly known as: ULTRAM Take 1 tablet (50 mg total) by mouth every 6 (six) hours as needed for moderate pain or severe pain. What changed: See the new instructions.   warfarin 5 MG tablet Commonly known as: COUMADIN Take as directed. If you are unsure how to take this medication, talk to your nurse or doctor. Original instructions: Take 0.5-1 tablets (2.5-5 mg total) by mouth daily at 6 PM. Take 5 mg today 04/11/2019, then 2.5 mg daily until INR is checked on Monday, 04/13/2019 and further recommendations given. What changed:   how much to take  additional instructions            Durable Medical Equipment  (From admission, onward)         Start     Ordered   04/07/19 1047  DME Oxygen  Once    Question Answer Comment  Length of Need Lifetime   Mode or (Route) Nasal cannula   Liters per Minute 2   Frequency Continuous (stationary and portable oxygen unit needed)   Oxygen delivery system Gas      04/07/19 1047   04/07/19 0000  For home use only DME Hospital bed    Question Answer Comment  Length of Need Lifetime   The above medical condition requires: Patient requires the ability to reposition frequently   Head must be elevated greater than: 45 degrees   Bed type  Heavy-duty, semi-electric (for patients >350 lbs.)   Investment banker, corporate Yes   Support Surface: Gel Overlay      04/07/19 1047         Allergies  Allergen Reactions  . Pseudoephedrine Other (See Comments)    REACTION: tightness in chest  . Augmentin [Amoxicillin-Pot Clavulanate] Itching and Rash    Did it involve swelling of the face/tongue/throat, SOB, or low BP? No Did it involve sudden or severe rash/hives, skin peeling, or any reaction on the inside of your mouth or nose? Yes Did you need to seek medical attention at a hospital or doctor's office? Yes When did it last happen?2019 If all above answers are "NO", may proceed with cephalosporin use.   . Latex Itching and Rash    Contact information for follow-up providers    MD at skilled nursing facility Follow up.   Why: Patient will need INR checked on Monday, 04/13/2019           Contact information for after-discharge care    Destination    [facility] .   Service: Skilled Nursing Contact information: [address] [place] [phone]                   The results of significant diagnostics from this hospitalization (including imaging, microbiology, ancillary and laboratory) are listed below for reference.    Significant Diagnostic Studies: Ct Foot Right Wo Contrast  Result Date: 03/30/2019 CLINICAL DATA:  Right foot cellulitis.  Evaluate for abscess. EXAM: CT OF THE RIGHT FOOT WITHOUT CONTRAST TECHNIQUE: Multidetector CT imaging of the right foot was performed according to the standard protocol. Multiplanar CT image reconstructions were also generated. COMPARISON:  Right foot x-rays from same day. FINDINGS: Bones/Joint/Cartilage No acute fracture or dislocation. No cortical destruction or osteolysis. 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 Mild midfoot and tibiotalar osteoarthritis. Large well corticated ossific density  dorsal to  the talar head could be degenerative root due to old trauma. No joint effusion. Osteopenia. Small plantar enthesophyte. Ligaments Suboptimally assessed by CT. Muscles and Tendons Grossly intact.  Severe fatty atrophy of the intrinsic foot muscles. Soft tissues Diffuse soft tissue swelling and skin thickening. Large skin blister involving the medial hindfoot. Additional skin blisters involving the dorsal forefoot and great toe. No fluid collection. IMPRESSION: 1. Diffuse soft tissue swelling and skin thickening of the foot, consistent with reported history of cellulitis. Multiple skin blisters. No abscess. 2.  No acute osseous abnormality. 3. 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 Electronically Signed   By: Obie Dredge M.D.   On: 03/30/2019 17:13   Dg Chest Port 1 View  Result Date: 04/03/2019 CLINICAL DATA:  Aspiration. EXAM: PORTABLE CHEST 1 VIEW COMPARISON:  Chest x-ray from yesterday. FINDINGS: The patient is slightly rotated to the right. Stable cardiomediastinal silhouette. Improved pulmonary vascular congestion. No focal consolidation, pleural effusion, or pneumothorax. No acute osseous abnormality. IMPRESSION: 1. Improved pulmonary vascular congestion. Electronically Signed   By: Obie Dredge M.D.   On: 04/03/2019 06:41   Dg Chest Port 1 View  Result Date: 04/02/2019 CLINICAL DATA:  63 year old female with right foot cellulitis. EXAM: PORTABLE CHEST 1 VIEW COMPARISON:  Portable chest 03/28/2019 and earlier. FINDINGS: Portable AP semi upright view at 1800 hours. Lower lung volumes. Stable cardiac size at the upper limits of normal. Other mediastinal contours are within normal limits. Visualized tracheal air column is within normal limits. No pneumothorax, pleural effusion or confluent pulmonary opacity. Increased pulmonary vascularity, no overt edema. Bulky humeral head spurring. IMPRESSION: Lower lung volumes with increased pulmonary vascularity but no overt edema. Electronically Signed    By: Odessa Fleming M.D.   On: 04/02/2019 18:26   Dg Chest Portable 1 View  Result Date: 03/28/2019 CLINICAL DATA:  Lower extremity edema EXAM: PORTABLE CHEST 1 VIEW COMPARISON:  October 04, 2009 FINDINGS: There is mild cardiomegaly with pulmonary vascularity within normal limits. There is atelectatic change in the left base. There is no frank consolidation or edema. There is aortic atherosclerosis. No adenopathy. No bone lesions. IMPRESSION: Mild cardiomegaly. Atelectasis left base without edema or consolidation. Aortic Atherosclerosis (ICD10-I70.0). Electronically Signed   By: Bretta Bang III M.D.   On: 03/28/2019 14:07   Dg Foot Complete Right  Result Date: 03/30/2019 CLINICAL DATA:  Foot infection pain for 2-3 days. EXAM: RIGHT FOOT COMPLETE - 3+ VIEW COMPARISON:  None. FINDINGS: Osteopenia. Hallux valgus with uncovering of the medial first metatarsal head and advanced degenerative changes at the first metatarsophalangeal joint. Soft tissue wound is seen medial to the first metatarsal head/neck. No definite osseous erosion to indicate osteomyelitis. Extensive soft tissue swelling throughout the foot. IMPRESSION: 1. No definitive evidence of osteomyelitis. 2. Severe hallux valgus with advanced first metatarsophalangeal joint osteoarthritis. 3. Osteopenia. Electronically Signed   By: Leanna Battles M.D.   On: 03/30/2019 14:10   Ct Renal Stone Study  Result Date: 03/28/2019 CLINICAL DATA:  Left-sided flank pain. EXAM: CT ABDOMEN AND PELVIS WITHOUT CONTRAST TECHNIQUE: Multidetector CT imaging of the abdomen and pelvis was performed following the standard protocol without IV contrast. COMPARISON:  None. FINDINGS: Lower chest: There is atelectasis in the lingula. There is no visualized pneumothorax at the left lung base. No significant pleural effusion. Hepatobiliary: There is hepatomegaly with hepatic steatosis. There is cholelithiasis without secondary signs of acute cholecystitis. Pancreas:  Unremarkable. No pancreatic ductal dilatation or surrounding inflammatory changes. Spleen: The  spleen is borderline enlarged measuring approximately 13.1 cm craniocaudad. Adrenals/Urinary Tract: There is a small right-sided adrenal myelolipoma measuring approximately 1.6 cm. There is a punctate nonobstructing 1-2 mm stone in the lower pole of the right kidney. There is a nonobstructing 6 mm stone in lower pole of the left kidney. There is no hydronephrosis. The bladder is unremarkable but is somewhat distended. Stomach/Bowel: There is sigmoid diverticulosis without CT evidence of diverticulitis. There is no CT evidence for acute appendicitis or a small bowel obstruction. Vascular/Lymphatic: Atherosclerotic changes are noted of the abdominal aorta without evidence of an abdominal aortic aneurysm. There is a possible enlarged left periaortic lymph node measuring approximately 1.4 cm. There are additional mildly enlarged retroperitoneal lymph nodes. There is a 1.7 cm lymph node along the right external iliac chain. There is a 1.7 cm lymph node along the right pelvic sidewall. There are enlarged right inguinal lymph nodes. Reproductive: Uterus and bilateral adnexa are unremarkable. Other: No abdominal wall hernia or abnormality. No abdominopelvic ascites. Musculoskeletal: There are multilevel degenerative changes throughout the visualized thoracolumbar spine. IMPRESSION: 1. No acute abnormality. No finding to explain the patient's left flank pain. 2. Bilateral nephrolithiasis.  No hydronephrosis. 3. There is cholelithiasis without secondary signs of acute cholecystitis. 4. Hepatomegaly with hepatic steatosis. 5. Borderline splenomegaly. 6. Enlarged retroperitoneal, pelvic, and inguinal lymph nodes. This could be secondary to a lower extremity infectious process and should be correlated clinically. 7.  Aortic Atherosclerosis (ICD10-I70.0). Electronically Signed   By: Katherine Mantle M.D.   On: 03/28/2019 20:15     Microbiology: Recent Results (from the past 240 hour(s))  Culture, Urine     Status: None   Collection Time: 04/02/19 10:08 AM   Specimen: Urine, Clean Catch  Result Value Ref Range Status   Specimen Description URINE, CLEAN CATCH  Final   Special Requests NONE  Final   Culture   Final    NO GROWTH Performed at [facility], [address] [place], [place] [address]    Report Status 04/03/2019 FINAL  Final  Novel Coronavirus, NAA (hospital order; send-out to ref lab)     Status: None   Collection Time: 04/02/19 12:32 PM   Specimen: Nasopharyngeal Swab; Respiratory  Result Value Ref Range Status   SARS-CoV-2, NAA NOT DETECTED NOT DETECTED Final    Comment: (NOTE) This test was developed and its performance characteristics determined by [employer] [facility]. This test has not been FDA cleared or approved. This test has been authorized by FDA under an Emergency Use Authorization (EUA). This test is only authorized for the duration of time the declaration that circumstances exist justifying the authorization of the emergency use of in vitro diagnostic tests for detection of SARS-CoV-2 virus and/or diagnosis of COVID-19 infection under section 564(b)(1) of the Act, 21 U.S.C. 191YNW-2(N)(5), unless the authorization is terminated or revoked sooner. When diagnostic testing is negative, the possibility of a false negative result should be considered in the context of a patient's recent exposures and the presence of clinical signs and symptoms consistent with COVID-19. An individual without symptoms of COVID-19 and who is not shedding SARS-CoV-2 virus would expect to have a negative (not detected) result in this assay. Performed  At: [facility] [address] [place], [place] [id number] Jolene Schimke MD QI:[id number]    Coronavirus Source NASOPHARYNGEAL  Final    Comment: Performed at [facility], [address], [place], [place] [address]  Novel  Coronavirus,NAA,(SEND-OUT TO REF LAB - TAT 24-48  hrs); Hosp Order     Status: None   Collection Time: 04/08/19  6:44 PM   Specimen: Nasopharyngeal Swab; Respiratory  Result Value Ref Range Status   SARS-CoV-2, NAA NOT DETECTED NOT DETECTED Final    Comment: (NOTE) This test was developed and its performance characteristics determined by [facility]. This test has not been FDA cleared or approved. This test has been authorized by FDA under an Emergency Use Authorization (EUA). This test is only authorized for the duration of time the declaration that circumstances exist justifying the authorization of the emergency use of in vitro diagnostic tests for detection of SARS-CoV-2 virus and/or diagnosis of COVID-19 infection under section 564(b)(1) of the Act, 21 U.S.C. 161WRU-0(A)(5360bbb-3(b)(1), unless the authorization is terminated or revoked sooner. When diagnostic testing is negative, the possibility of a false negative result should be considered in the context of a patient's recent exposures and the presence of clinical signs and symptoms consistent with COVID-19. An individual without symptoms of COVID-19 and who is not shedding SARS-CoV-2 virus would expect to have a negative (not detected) result in this assay. Performed  At: [facility] [address] [place], [place] [id number] Jolene SchimkeNagendra Sanjai MD NW:[id number]    Coronavirus Source NASOPHARYNGEAL  Final    Comment: Performed at [facility], [address] [place], [place] [id number]     Labs: Basic Metabolic Panel: Recent Labs  Lab 04/06/19 0247 04/08/19 0344 04/09/19 0321 04/10/19 0202 04/11/19 0220  NA 140 134* 136 134* 133*  K 4.1 4.2 4.3 4.1 4.3  CL 102 95* 97* 96* 96*  CO2 31 33* 30 31 26   GLUCOSE 105* 99 96 101* 98  BUN 12 15 14 14 18   CREATININE 0.80 1.05* 0.94 0.90 0.99  CALCIUM 9.7 10.0 10.1 10.5* 11.1*  MG  --   --  1.8 2.1 2.1   Liver Function Tests: Recent Labs  Lab 04/05/19 0304  04/06/19 0247  AST 36 39  ALT 15 16  ALKPHOS 137* 126  BILITOT 0.5 0.6  PROT 6.4* 6.3*  ALBUMIN 1.7* 1.7*   No results for input(s): LIPASE, AMYLASE in the last 168 hours. No results for input(s): AMMONIA in the last 168 hours. CBC: Recent Labs  Lab 04/05/19 0304 04/06/19 0247 04/08/19 0344 04/09/19 0321 04/10/19 0202 04/11/19 0220  WBC 14.4* 9.8 9.2 7.7 9.3 8.9  NEUTROABS 12.1* 8.1* 7.5  --   --   --   HGB 8.8* 8.5* 8.7* 8.6* 8.5* 9.2*  HCT 29.1* 28.5* 29.1* 28.4* 28.9* 31.4*  MCV 98.6 99.0 99.0 99.0 99.7 101.9*  PLT 455* 454* 414* 398 417* 459*   Cardiac Enzymes: No results for input(s): CKTOTAL, CKMB, CKMBINDEX, TROPONINI in the last 168 hours. BNP: BNP (last 3 results) Recent Labs    04/03/19 1150  BNP 775.4*    ProBNP (last 3 results) No results for input(s): PROBNP in the last 8760 hours.  CBG: Recent Labs  Lab 04/10/19 1111 04/10/19 1602 04/10/19 2059 04/11/19 0627 04/11/19 1116  GLUCAP 95 103* 102* 93 100*       Signed:  Ramiro Harvestaniel Johnie Makki MD.  [facility] 04/11/2019, 11:38 AM

## 2019-04-11 NOTE — TOC Transition Note (Signed)
Transition of Care [facility]) - CM/SW Discharge Note   Patient Details  Name: Amy Bryant MRN: [id number] Date of Birth: December 10, 1955  [facility]) CM/SW Contact:  Gabrielle Dare Phone Number: 04/11/2019, 12:22 PM   Clinical Narrative:   Patient will Discharge To: [facility] Anticipated DC Date:04/11/2019 Family Notified: yes, left vm on spouse's phone Kyren Vaux,  [phone] Transport By: Corey Harold   Per MD patient ready for DC to [facility] . RN, patient, patient's family, and facility notified of DC. Assessment, Fl2/Pasrr, and Discharge Summary sent to facility. RN given number for report [phone], Room 905). DC packet on chart. Ambulance transport requested for patient.     CSW signing off.  Reed Breech LCSWA [phone]      Final next level of care: Skilled Nursing Facility Barriers to Discharge: No Barriers Identified   Patient Goals and CMS Choice        Discharge Placement              Patient chooses bed at: [facility] Patient to be transferred to facility by: [facility] Name of family member notified: Alvy Beal Patient and family notified of of transfer: 04/11/19  Discharge Plan and Services In-house Referral: Clinical Social Work                                   Social Determinants of Health (SDOH) Interventions     Readmission Risk Interventions No flowsheet data found.

## 2019-04-11 NOTE — Discharge Instructions (Signed)

## 2019-04-14 ENCOUNTER — Telehealth: Payer: Self-pay | Admitting: *Deleted

## 2019-04-14 DIAGNOSIS — I5033 Acute on chronic diastolic (congestive) heart failure: Secondary | ICD-10-CM | POA: Diagnosis not present

## 2019-04-14 DIAGNOSIS — L03115 Cellulitis of right lower limb: Secondary | ICD-10-CM | POA: Diagnosis not present

## 2019-04-14 DIAGNOSIS — N179 Acute kidney failure, unspecified: Secondary | ICD-10-CM | POA: Diagnosis not present

## 2019-04-14 DIAGNOSIS — J9620 Acute and chronic respiratory failure, unspecified whether with hypoxia or hypercapnia: Secondary | ICD-10-CM | POA: Diagnosis not present

## 2019-04-14 NOTE — Care Management (Signed)
Pt discharge to [facility] 04/11/19.  CM contacted by APS worker Ms Tim Lair on 04/14/19 [phone]).  Ms Tim Lair informed CM that pt has an open case with them.  Ms Tim Lair informed CM that she has been in contact with both pt and husband - pt informed her that she was discharged to [facility].  Ms Tim Lair denied concerns with pts disposition.  APS to follow up with pt directly/facility if needed.

## 2019-04-15 DIAGNOSIS — L03115 Cellulitis of right lower limb: Secondary | ICD-10-CM | POA: Diagnosis not present

## 2019-04-15 DIAGNOSIS — T7840XA Allergy, unspecified, initial encounter: Secondary | ICD-10-CM | POA: Diagnosis not present

## 2019-04-15 DIAGNOSIS — Z79899 Other long term (current) drug therapy: Secondary | ICD-10-CM | POA: Diagnosis not present

## 2019-04-15 DIAGNOSIS — Z7901 Long term (current) use of anticoagulants: Secondary | ICD-10-CM | POA: Diagnosis not present

## 2019-04-17 DIAGNOSIS — Z7901 Long term (current) use of anticoagulants: Secondary | ICD-10-CM | POA: Diagnosis not present

## 2019-04-17 DIAGNOSIS — D649 Anemia, unspecified: Secondary | ICD-10-CM | POA: Diagnosis not present

## 2019-04-17 DIAGNOSIS — T7840XA Allergy, unspecified, initial encounter: Secondary | ICD-10-CM | POA: Diagnosis not present

## 2019-04-17 DIAGNOSIS — Z79899 Other long term (current) drug therapy: Secondary | ICD-10-CM | POA: Diagnosis not present

## 2019-04-21 DIAGNOSIS — R110 Nausea: Secondary | ICD-10-CM | POA: Diagnosis not present

## 2019-04-21 DIAGNOSIS — K219 Gastro-esophageal reflux disease without esophagitis: Secondary | ICD-10-CM | POA: Diagnosis not present

## 2019-04-22 DIAGNOSIS — Z7901 Long term (current) use of anticoagulants: Secondary | ICD-10-CM | POA: Diagnosis not present

## 2019-04-22 DIAGNOSIS — Z79899 Other long term (current) drug therapy: Secondary | ICD-10-CM | POA: Diagnosis not present

## 2019-04-22 DIAGNOSIS — K219 Gastro-esophageal reflux disease without esophagitis: Secondary | ICD-10-CM | POA: Diagnosis not present

## 2019-04-22 DIAGNOSIS — R110 Nausea: Secondary | ICD-10-CM | POA: Diagnosis not present

## 2019-04-24 DIAGNOSIS — Z79899 Other long term (current) drug therapy: Secondary | ICD-10-CM | POA: Diagnosis not present

## 2019-04-24 DIAGNOSIS — Z7901 Long term (current) use of anticoagulants: Secondary | ICD-10-CM | POA: Diagnosis not present

## 2019-04-24 DIAGNOSIS — R42 Dizziness and giddiness: Secondary | ICD-10-CM | POA: Diagnosis not present

## 2019-04-24 DIAGNOSIS — R300 Dysuria: Secondary | ICD-10-CM | POA: Diagnosis not present

## 2019-04-28 DIAGNOSIS — Z79899 Other long term (current) drug therapy: Secondary | ICD-10-CM | POA: Diagnosis not present

## 2019-04-28 DIAGNOSIS — R300 Dysuria: Secondary | ICD-10-CM | POA: Diagnosis not present

## 2019-04-28 DIAGNOSIS — Z7901 Long term (current) use of anticoagulants: Secondary | ICD-10-CM | POA: Diagnosis not present

## 2019-05-01 DIAGNOSIS — T7840XA Allergy, unspecified, initial encounter: Secondary | ICD-10-CM | POA: Diagnosis not present

## 2019-05-01 DIAGNOSIS — I2699 Other pulmonary embolism without acute cor pulmonale: Secondary | ICD-10-CM | POA: Diagnosis not present

## 2019-05-01 DIAGNOSIS — L03115 Cellulitis of right lower limb: Secondary | ICD-10-CM | POA: Diagnosis not present

## 2019-05-01 DIAGNOSIS — R42 Dizziness and giddiness: Secondary | ICD-10-CM | POA: Diagnosis not present

## 2019-05-02 DIAGNOSIS — L03115 Cellulitis of right lower limb: Secondary | ICD-10-CM | POA: Diagnosis not present

## 2019-05-02 DIAGNOSIS — J449 Chronic obstructive pulmonary disease, unspecified: Secondary | ICD-10-CM | POA: Diagnosis not present

## 2019-05-02 DIAGNOSIS — E119 Type 2 diabetes mellitus without complications: Secondary | ICD-10-CM | POA: Diagnosis not present

## 2019-05-02 DIAGNOSIS — I4891 Unspecified atrial fibrillation: Secondary | ICD-10-CM | POA: Diagnosis not present

## 2019-05-02 DIAGNOSIS — D509 Iron deficiency anemia, unspecified: Secondary | ICD-10-CM | POA: Diagnosis not present

## 2019-05-02 DIAGNOSIS — I279 Pulmonary heart disease, unspecified: Secondary | ICD-10-CM | POA: Diagnosis not present

## 2019-05-02 DIAGNOSIS — J9621 Acute and chronic respiratory failure with hypoxia: Secondary | ICD-10-CM | POA: Diagnosis not present

## 2019-05-02 DIAGNOSIS — I5033 Acute on chronic diastolic (congestive) heart failure: Secondary | ICD-10-CM | POA: Diagnosis not present

## 2019-05-05 ENCOUNTER — Telehealth: Payer: Self-pay | Admitting: *Deleted

## 2019-05-05 DIAGNOSIS — I279 Pulmonary heart disease, unspecified: Secondary | ICD-10-CM | POA: Diagnosis not present

## 2019-05-05 DIAGNOSIS — E119 Type 2 diabetes mellitus without complications: Secondary | ICD-10-CM | POA: Diagnosis not present

## 2019-05-05 DIAGNOSIS — I5033 Acute on chronic diastolic (congestive) heart failure: Secondary | ICD-10-CM | POA: Diagnosis not present

## 2019-05-05 DIAGNOSIS — J449 Chronic obstructive pulmonary disease, unspecified: Secondary | ICD-10-CM | POA: Diagnosis not present

## 2019-05-05 DIAGNOSIS — L03115 Cellulitis of right lower limb: Secondary | ICD-10-CM | POA: Diagnosis not present

## 2019-05-05 DIAGNOSIS — I4891 Unspecified atrial fibrillation: Secondary | ICD-10-CM | POA: Diagnosis not present

## 2019-05-05 DIAGNOSIS — D509 Iron deficiency anemia, unspecified: Secondary | ICD-10-CM | POA: Diagnosis not present

## 2019-05-05 DIAGNOSIS — J9621 Acute and chronic respiratory failure with hypoxia: Secondary | ICD-10-CM | POA: Diagnosis not present

## 2019-05-05 NOTE — Telephone Encounter (Signed)
Olivia Mackie nurse with [facility] left a voicemail stating that she was out today to see the patient for wound care. Olivia Mackie stated that the patient complained of burning with urination. Olivia Mackie stated that patient saved her some urine in a cup and it was cloudy and had an odor. Olivia Mackie stated that patient does not have a fever and no blood noted in urine. Olivia Mackie stated that she was calling to let Dr. Glori Bickers know.

## 2019-05-05 NOTE — Telephone Encounter (Signed)
Verbal order given to Olivia Mackie she will get urine sample and do a UA/ Cx at her next appt with pt

## 2019-05-05 NOTE — Telephone Encounter (Signed)
Can they run a ua and culture? If not can someone bring in the sample?

## 2019-05-06 ENCOUNTER — Telehealth: Payer: Self-pay | Admitting: *Deleted

## 2019-05-06 NOTE — Telephone Encounter (Signed)
Will with  OT left a voicemail stating that an order was placed for patient. Will is calling stating that patient is refusing the occupational therapy evaluation.

## 2019-05-06 NOTE — Telephone Encounter (Signed)
Aware-thanks for letting me know  

## 2019-05-06 NOTE — Telephone Encounter (Signed)
Amy Bryant advised on his secure voicemail

## 2019-05-07 ENCOUNTER — Telehealth: Payer: Self-pay | Admitting: Family Medicine

## 2019-05-07 ENCOUNTER — Ambulatory Visit (INDEPENDENT_AMBULATORY_CARE_PROVIDER_SITE_OTHER): Payer: Medicare HMO

## 2019-05-07 DIAGNOSIS — Z86711 Personal history of pulmonary embolism: Secondary | ICD-10-CM

## 2019-05-07 DIAGNOSIS — J9621 Acute and chronic respiratory failure with hypoxia: Secondary | ICD-10-CM | POA: Diagnosis not present

## 2019-05-07 DIAGNOSIS — I4891 Unspecified atrial fibrillation: Secondary | ICD-10-CM | POA: Diagnosis not present

## 2019-05-07 DIAGNOSIS — N390 Urinary tract infection, site not specified: Secondary | ICD-10-CM | POA: Diagnosis not present

## 2019-05-07 DIAGNOSIS — I279 Pulmonary heart disease, unspecified: Secondary | ICD-10-CM | POA: Diagnosis not present

## 2019-05-07 DIAGNOSIS — E119 Type 2 diabetes mellitus without complications: Secondary | ICD-10-CM | POA: Diagnosis not present

## 2019-05-07 DIAGNOSIS — I5033 Acute on chronic diastolic (congestive) heart failure: Secondary | ICD-10-CM | POA: Diagnosis not present

## 2019-05-07 DIAGNOSIS — Z5181 Encounter for therapeutic drug level monitoring: Secondary | ICD-10-CM | POA: Diagnosis not present

## 2019-05-07 DIAGNOSIS — J449 Chronic obstructive pulmonary disease, unspecified: Secondary | ICD-10-CM | POA: Diagnosis not present

## 2019-05-07 DIAGNOSIS — Z86718 Personal history of other venous thrombosis and embolism: Secondary | ICD-10-CM

## 2019-05-07 DIAGNOSIS — L03115 Cellulitis of right lower limb: Secondary | ICD-10-CM | POA: Diagnosis not present

## 2019-05-07 DIAGNOSIS — D509 Iron deficiency anemia, unspecified: Secondary | ICD-10-CM | POA: Diagnosis not present

## 2019-05-07 DIAGNOSIS — I482 Chronic atrial fibrillation, unspecified: Secondary | ICD-10-CM

## 2019-05-07 LAB — POCT INR: INR: 4.3 — AB (ref 2.0–3.0)

## 2019-05-07 NOTE — Telephone Encounter (Signed)
INR today 4.3  Patient states she has been taking 2.5mg  1/2 pill on M, W, Fri and 1 pill the rest while in nursing facility and since d/c'ing home last Saturday.   Will have her hold her Coumadin today and tomorrow (8/7) then restart a decreased new dosing taking 2.5mg  S, T, thur,Sat and 5mg  on M, W, Fri.  Patient verbalizes understanding and was able to repeat back to me.  Also, notified [facility] health nurse with [facility].  They will recheck her INR in 1 week. She denies any signs of bleeding or bruising and will go to the ER if any concerns develop.    Notified Olivia Mackie patient's nurse with [facility].

## 2019-05-07 NOTE — Patient Instructions (Signed)
INR today 4.3  Patient states she has been taking 2.5mg  1/2 pill on M, W, Fri and 1 pill the rest while in nursing facility and since d/c'ing home last Saturday.   Will have her hold her Coumadin today and tomorrow (8/7) then restart a decreased new dosing taking 2.5mg  S, T, thur,Sat and 5mg  on M, W, Fri.  Patient verbalizes understanding and was able to repeat back to me.  Also, notified Home health nurse with [facility].  They will recheck her INR in 1 week. She denies any signs of bleeding or bruising and will go to the ER if any concerns develop.

## 2019-05-07 NOTE — Telephone Encounter (Signed)
Patient  Called today and stated her [facility] nurse was with her and her INR was 4.3.   She would like a call back with instructions on what she should do   C/B # [phone]

## 2019-05-08 ENCOUNTER — Ambulatory Visit (INDEPENDENT_AMBULATORY_CARE_PROVIDER_SITE_OTHER): Payer: Medicare HMO | Admitting: Family Medicine

## 2019-05-08 DIAGNOSIS — I482 Chronic atrial fibrillation, unspecified: Secondary | ICD-10-CM

## 2019-05-08 DIAGNOSIS — E662 Morbid (severe) obesity with alveolar hypoventilation: Secondary | ICD-10-CM | POA: Diagnosis not present

## 2019-05-08 DIAGNOSIS — L03115 Cellulitis of right lower limb: Secondary | ICD-10-CM | POA: Diagnosis not present

## 2019-05-08 DIAGNOSIS — D689 Coagulation defect, unspecified: Secondary | ICD-10-CM

## 2019-05-08 DIAGNOSIS — J690 Pneumonitis due to inhalation of food and vomit: Secondary | ICD-10-CM

## 2019-05-08 DIAGNOSIS — I279 Pulmonary heart disease, unspecified: Secondary | ICD-10-CM

## 2019-05-08 DIAGNOSIS — J9621 Acute and chronic respiratory failure with hypoxia: Secondary | ICD-10-CM

## 2019-05-08 NOTE — Patient Instructions (Addendum)
Let's get labs in 1 month with home care

## 2019-05-08 NOTE — Progress Notes (Signed)
Virtual Visit via Video Note  I connected with Amy Bryant on 05/08/19 at 10:00 AM EDT by a video enabled telemedicine application and verified that I am speaking with the correct person using two identifiers.  Location: Patient: home Provider: office    I discussed the limitations of evaluation and management by telemedicine and the availability of in person appointments. The patient expressed understanding and agreed to proceed.  History of Present Illness: Pt presents for f/u of rehab   hosp d/c summary Hospital Course:  1 acute on chronic respiratory failure with hypoxia secondary to aspiration pneumonia and acute on chronic diastolic heart failure/noncompliance with CPAP/COPD/A. fib Patient improving clinically. Patient has been treated with antibiotics for aspiration pneumonia.  [facility] as needed. Patient status post treatment for aspiration pneumonia. Patient was also diuresed with IV Lasix with good urine output.  Patient was -20.486 L during this hospitalization.  Patient improved clinically.  IV Lasix transition back to home dose oral Lasix on discharge.  Patient be discharged with 2 more days of oral antibiotics.  Patient will be discharged to skilled nursing facility in stable and improved condition.   2. Aspiration pneumonia Status post antibiotic treatment. Improved clinically.  Was maintained on Dulera and albuterol.  3. Acute on chronic diastolic heart failure Improvingclinically.   Patient was placed on IV Lasix with good urine output during the hospitalization and clinical improvement.  Patient was -20.486 L during this hospitalization.  Patient will be transition back to home dose oral Lasix on discharge.  Patient also maintained on Cardizem during the hospitalization.  Outpatient follow-up.    4. Sepsis/right lower extremity cellulitis Cellulitis improved significantly during the hospitalization. Patient  has completed course of IV antibiotics currently on oral antibiotics for cellulitis for additional coverage. Continue current wound care. Status post [facility] blebs [facility] right lower extremity on 03/29/2019.  Patient was maintained on dressing changes as recommended by wound care nurse.  5. Obstructive sleep apnea/OHS CPAP nightly. Per RN notes is noted that patient has been refusing CPAP.  6. COPD Patient maintained on Dulera and albuterol as needed during the hospitalization.  7. Chronic atrial fibrillation Remained controlled on Cardizem 60 mg twice daily. Coumadin per pharmacy.  Patient will be discharged on Coumadin 5 mg on 04/11/2019, then 2.5 mg daily thereafter until PT/INR is checked on Monday, 04/13/2019 and further recommendations given.  8. Hypomagnesemia Magnesium at 2.1. Follow.  9.  Acute kidney injury Resolved.  10. Diabetes mellitus type 2 Hemoglobin A1c 5.2 on 03/28/2019.   Patient's oral hypoglycemic agents were held during the hospitalization and patient maintained on sliding scale insulin.  Oral hypoglycemic agents will be resumed on discharge.   11. Hypothyroidism Patient was maintained on home regimen of Synthroid.  12.Normocytic anemia H&H stable. Follow.  13. Morbid obesity    Chemistry      Component Value Date/Time   NA 133 (L) 04/11/2019 0220   K 4.3 04/11/2019 0220   CL 96 (L) 04/11/2019 0220   CO2 26 04/11/2019 0220   BUN 18 04/11/2019 0220   CREATININE 0.99 04/11/2019 0220      Component Value Date/Time   CALCIUM 11.1 (H) 04/11/2019 0220   CALCIUM 11.7 (H) 11/02/2010 2223   ALKPHOS 126 04/06/2019 0247   AST 39 04/06/2019 0247   ALT 16 04/06/2019 0247   BILITOT 0.6 04/06/2019 0247     Lab Results  Component Value Date   WBC 8.9  04/11/2019   HGB 9.2 (L) 04/11/2019   HCT 31.4 (L) 04/11/2019   MCV 101.9 (H) 04/11/2019   PLT 459 (H) 04/11/2019  neg urine Neg covid   Still taking one iron daily   She  is still tired  Came home last Friday  Slept for 3 days Still on 02  30 days to try her cpap machine   Breathing is ok -just gets tired easily  Has had folks taking care of her  PT is coming Monday - has done well before   Leg is still a little red/not hot  Little to no bleeding  Had 2 sores from lying in bed - they are better now  Also moving more now and sitting instead of lying   Done with abx  Pain control is fair - 3/10 on good days   (arthritis)   Weighs at home  Lost 12 lb in [employer] -she avoided some of the food because it was too much salt  285 last wt at home Thinks her legs are less swollen now   bp has been stable   Lab Results  Component Value Date   INR 4.3 (A) 05/07/2019   INR 1.7 (H) 04/11/2019   INR 1.9 (H) 04/10/2019   dose was changed INR is Thursday (home care)     Lab Results  Component Value Date   HGBA1C 5.2 03/28/2019   Review of Systems  Constitutional: Positive for malaise/fatigue and weight loss. Negative for chills and fever.  HENT: Negative for sinus pain and sore throat.   Eyes: Negative for blurred vision, discharge and redness.  Respiratory: Positive for cough, shortness of breath and wheezing.        Covid neg Reviewed hospital records, lab results and studies in detail   tx for aspiration pneumonia and CHF Much clinical improvement   Cardiovascular: Positive for leg swelling. Negative for chest pain and palpitations.       Leg swelling is much improved  Gastrointestinal: Negative for abdominal pain, heartburn, nausea and vomiting.  Musculoskeletal: Positive for joint pain.  Skin: Negative for rash.  Neurological: Negative for dizziness and headaches.  Psychiatric/Behavioral: Negative.  Negative for memory loss. The patient is not nervous/anxious.     Patient Active Problem List   Diagnosis Date Noted  . Aspiration pneumonia ([place]) 04/11/2019  . Acute on chronic diastolic CHF (congestive heart failure) ([place]) 04/11/2019   . Cellulitis of right lower extremity   . Cellulitis of right leg 03/28/2019  . Acute on chronic respiratory failure with hypoxia ([place]) 03/28/2019  . Panic attacks 01/20/2019  . Iron deficiency anemia: Severe 01/14/2019  . Pressure injury of skin 01/14/2019  . Symptomatic anemia   . Coagulopathy ([place])   . Chronic anticoagulation   . Absolute anemia 01/08/2019  . Reactive depression (situational) 12/29/2018  . Mobility impaired 12/12/2018  . Hematuria 12/03/2017  . Pedal edema 12/23/2016  . OSA (obstructive sleep apnea) 12/21/2016  . Financial difficulties 09/16/2016  . Colon cancer screening 12/15/2013  . Encounter for therapeutic drug monitoring 11/05/2013  . Routine general medical examination at a health care facility 05/20/2012  . Atrial fibrillation ([place]) 11/23/2010  . History of pulmonary embolism 11/23/2010  . Vitamin D deficiency 11/15/2010  . DYSURIA, HX OF 08/19/2009  . PURE HYPERCHOLESTEROLEMIA 08/16/2009  . ATRIAL FIBRILLATION, HX OF 12/03/2008  . Diabetes type 2, controlled ([place]) 08/13/2008  . Hyperparathyroidism ([place]) 07/30/2008  . Hypothyroidism 02/13/2008  . Morbid obesity ([place]) 02/04/2008  . Reactive  airway disease 02/04/2008  . GERD 02/04/2008  . DISC DISEASE, LUMBAR 02/04/2008  . PULMONARY EMBOLISM, HX OF 02/04/2008  . TOBACCO USE, QUIT 02/04/2008  . Obesity hypoventilation syndrome (HCC) 08/11/2007  . Chronic pulmonary heart disease (HCC) 08/11/2007   Past Medical History:  Diagnosis Date  . Atrial fibrillation (HCC)    with cardioversion success  . CHF (congestive heart failure) (HCC)   . COPD (chronic obstructive pulmonary disease) (HCC)   . Cor pulmonale (HCC)    and obesity hypoventilation syndrome  . Diabetes mellitus without complication (HCC)   . GERD (gastroesophageal reflux disease)   . Hypothyroid   . Morbid obesity (HCC)   . OA (osteoarthritis)   . Pulmonary embolism (HCC)    hx of on coumadin  . Reactive airways dysfunction  syndrome [facility](HCC)    Past Surgical History:  Procedure Laterality Date  . BIOPSY  01/12/2019   Procedure: BIOPSY;  Surgeon: Beverley FiedlerPyrtle, Jay M, MD;  [facility];  Service: Gastroenterology;;  . COLONOSCOPY WITH PROPOFOL N/A 01/12/2019   Procedure: COLONOSCOPY WITH PROPOFOL;  Surgeon: Beverley FiedlerPyrtle, Jay M, MD;  [facility];  Service: Gastroenterology;  Laterality: N/A;  . ESOPHAGOGASTRODUODENOSCOPY (EGD) WITH PROPOFOL N/A 01/12/2019   Procedure: ESOPHAGOGASTRODUODENOSCOPY (EGD) WITH PROPOFOL;  Surgeon: Beverley FiedlerPyrtle, Jay M, MD;  [facility];  Service: Gastroenterology;  Laterality: N/A;  . TONSILLECTOMY     Social History   Tobacco Use  . Smoking status: Former Games developermoker  . Smokeless tobacco: Former Engineer, waterUser  Substance Use Topics  . Alcohol use: No    Alcohol/week: 0.0 standard drinks  . Drug use: No   Family History  Problem Relation Age of Onset  . Heart disease Father 6651       MI  . Diabetes Brother    Allergies  Allergen Reactions  . Pseudoephedrine Other (See Comments)    REACTION: tightness in chest  . Augmentin [Amoxicillin-Pot Clavulanate] Itching and Rash    Did it involve swelling of the face/tongue/throat, SOB, or low BP? No Did it involve sudden or severe rash/hives, skin peeling, or any reaction on the inside of your mouth or nose? Yes Did you need to seek medical attention at a hospital or doctor's office? Yes When did it last happen?2019 If all above answers are "NO", may proceed with cephalosporin use.   . Latex Itching and Rash   Current Outpatient Medications on File Prior to Visit  Medication Sig Dispense Refill  . albuterol (PROVENTIL) (2.5 MG/3ML) 0.083% nebulizer solution INHALE 1 VIAL VIA NEBULIZER EVERY 4 HOURS AS NEEDED FOR WHEEZING 675 mL 1  . ALPRAZolam (XANAX) 0.5 MG tablet Take 1 tablet (0.5 mg total) by mouth 2 (two) times daily as needed for anxiety (panic attack). 30 tablet 0  . Amino Acids-Protein Hydrolys (FEEDING SUPPLEMENT,  PRO-STAT SUGAR FREE 64,) LIQD Take 30 mLs by mouth 2 (two) times daily. 887 mL 0  . budesonide-formoterol (SYMBICORT) 160-4.5 MCG/ACT inhaler INHALE 2 PUFFS INTO LUNGS TWICE A DAY (Patient taking differently: Inhale 2 puffs into the lungs 2 (two) times daily. ) 10.2 Inhaler 5  . cholecalciferol (VITAMIN D) 1000 UNITS tablet Take 4,000 Units by mouth daily.    . ciclopirox (LOPROX) 0.77 % cream APPLY TO AFFECTED AREA TWICE A DAY AS NEEDED (Patient taking differently: Apply 1 application topically 2 (two) times daily as needed (skin care under breast). ) 90 g 0  . diltiazem (CARTIA XT) 120 MG 24 hr capsule Take 1 capsule (120 mg total) by mouth  daily. 90 capsule 3  . feeding supplement, ENSURE ENLIVE, (ENSURE ENLIVE) LIQD Take 237 mLs by mouth 2 (two) times daily between meals. 237 mL 0  . FLUoxetine (PROZAC) 40 MG capsule Take 1 capsule (40 mg total) by mouth daily. 90 capsule 3  . furosemide (LASIX) 80 MG tablet TAKE 1 TABLET (80 MG TOTAL) BY MOUTH 2 (TWO) TIMES DAILY. 180 tablet 0  . glipiZIDE (GLUCOTROL XL) 5 MG 24 hr tablet Take 1 tablet (5 mg total) by mouth daily with breakfast. 30 tablet 5  . glucose blood test strip Test blood sugar once daily and as needed for DM 250.0 100 each 3  . Hydrocortisone (GERHARDT'S BUTT CREAM) CREA Apply 1 application topically 3 (three) times daily as needed for irritation. 1 each 0  . iron polysaccharides (NU-IRON) 150 MG capsule Take 1 capsule (150 mg total) by mouth 2 (two) times daily. 180 capsule 1  . Lancets (ONETOUCH ULTRASOFT) lancets Test blood sugar once daily and as needed for DM 250.0     . levothyroxine (SYNTHROID, LEVOTHROID) 175 MCG tablet Take 1 tablet (175 mcg total) by mouth daily before breakfast. 30 tablet 5  . metFORMIN (GLUCOPHAGE) 1000 MG tablet TAKE 1 TABLET BY MOUTH TWICE DAILY WITH A MEAL (Patient taking differently: Take 1,000 mg by mouth 2 (two) times daily with a meal. ) 180 tablet 0  . mometasone (ELOCON) 0.1 % cream Apply 1  application topically See admin instructions. Tiny amount to each ear canal twice weekly as needed.    . multivitamin (PROSIGHT) TABS tablet Take 1 tablet by mouth daily. 30 tablet 0  . mupirocin ointment (BACTROBAN) 2 % APPLY TO AFFECTED AREA TWICE A DAY (Patient taking differently: Apply 1 application topically as needed (skin). ) 22 g 4  . pantoprazole (PROTONIX) 40 MG tablet Take 1 tablet (40 mg total) by mouth daily at 6 (six) AM. 90 tablet 3  . potassium chloride SA (KLOR-CON M20) 20 MEQ tablet TAKE 2 TABLETS BY MOUTH EVERY DAY (Patient taking differently: Take 40 mEq by mouth daily. ) 180 tablet 0  . senna-docusate (SENOKOT-S) 8.6-50 MG tablet Take 1 tablet by mouth 2 (two) times daily.    . simvastatin (ZOCOR) 20 MG tablet TAKE 1 TABLET BY MOUTH EVERYDAY AT BEDTIME (Patient taking differently: Take 20 mg by mouth daily at 6 PM. ) 90 tablet 1  . traMADol (ULTRAM) 50 MG tablet Take 1 tablet (50 mg total) by mouth every 6 (six) hours as needed for moderate pain or severe pain. 10 tablet 0  . warfarin (COUMADIN) 5 MG tablet Take 0.5-1 tablets (2.5-5 mg total) by mouth daily at 6 PM. Take 5 mg today 04/11/2019, then 2.5 mg daily until INR is checked on Monday, 04/13/2019 and further recommendations given. 30 tablet 0   No current facility-administered medications on file prior to visit.     Observations/Objective:  Patient appears well, in no distress Weight is baseline (morbidly obese)  No facial swelling or asymmetry Normal voice-not hoarse and no slurred speech No obvious tremor or mobility impairment Moving neck and UEs normally Able to hear the call well  No cough or shortness of breath during interview (she clears throat intermittently) Talkative and mentally sharp with no cognitive changes No skin changes on face or neck , no rash or pallor (fair complexion baseline) Affect is normal    Assessment and Plan: Problem List Items Addressed This Visit      Cardiovascular and  Mediastinum   Atrial  fibrillation (HCC) (Chronic)    Continues warfarin Lab Results  Component Value Date   INR 4.3 (A) 05/07/2019   INR 1.7 (H) 04/11/2019   INR 1.9 (H) 04/10/2019  home care is re checking this on Thursday  She is now eating better        Chronic pulmonary heart disease (HCC)    Recent hosp and diuresed for CHF (diastolic) Much clinical improvement         Respiratory   Obesity hypoventilation syndrome (HCC)    Continues 02 by [place]        Acute on chronic respiratory failure with hypoxia [facility](HCC)    Reviewed hospital records, lab results and studies in detail  Pt had CHF and aspiration pneumonia  Much clinical improvement      Aspiration pneumonia (HCC) - Primary    Reviewed hospital records, lab results and studies in detail   Clinically resolved  Wears 02 baseline  Had PT in rehab- getting energy level back         Hematopoietic and Hemostatic   Coagulopathy (HCC)    Pt continues warfarin        Other   Morbid obesity (HCC)    Enc strongly to work on better food choices and more activity  Getting PT      Cellulitis of right leg    Much improvement after hosp /wound care and abx  Getting home nursing care for wounds now  Reviewed hospital records, lab results and studies in detail            Follow Up Instructions: Glad you are feeling better  Continue current medicines Also home nursing and PT  Avoid prolonged lying or sitting to minimize ulcers Consider working on weight loss Let's check labs in a month  INR next week as planned    I discussed the assessment and treatment plan with the patient. The patient was provided an opportunity to ask questions and all were answered. The patient agreed with the plan and demonstrated an understanding of the instructions.   The patient was advised to call back or seek an in-person evaluation if the symptoms worsen or if the condition fails to improve as anticipated.    Roxy MannsMarne Aubrey Voong,  MD

## 2019-05-10 NOTE — Assessment & Plan Note (Signed)
Continues 02 by [place]

## 2019-05-10 NOTE — Assessment & Plan Note (Signed)
Reviewed hospital records, lab results and studies in detail   Clinically resolved  Wears 02 baseline  Had PT in rehab- getting energy level back

## 2019-05-10 NOTE — Assessment & Plan Note (Signed)
Much improvement after hosp /wound care and abx  Getting home nursing care for wounds now  Reviewed hospital records, lab results and studies in detail

## 2019-05-10 NOTE — Assessment & Plan Note (Signed)
Reviewed hospital records, lab results and studies in detail  Pt had CHF and aspiration pneumonia  Much clinical improvement

## 2019-05-10 NOTE — Assessment & Plan Note (Signed)
Enc strongly to work on better food choices and more activity  Getting PT

## 2019-05-10 NOTE — Assessment & Plan Note (Signed)
Recent hosp and diuresed for CHF (diastolic) Much clinical improvement

## 2019-05-10 NOTE — Assessment & Plan Note (Signed)
Continues warfarin Lab Results  Component Value Date   INR 4.3 (A) 05/07/2019   INR 1.7 (H) 04/11/2019   INR 1.9 (H) 04/10/2019  home care is re checking this on Thursday  She is now eating better

## 2019-05-10 NOTE — Assessment & Plan Note (Signed)
Pt continues warfarin

## 2019-05-11 ENCOUNTER — Telehealth: Payer: Self-pay | Admitting: *Deleted

## 2019-05-11 DIAGNOSIS — J9621 Acute and chronic respiratory failure with hypoxia: Secondary | ICD-10-CM

## 2019-05-11 DIAGNOSIS — I5033 Acute on chronic diastolic (congestive) heart failure: Secondary | ICD-10-CM | POA: Diagnosis not present

## 2019-05-11 DIAGNOSIS — I279 Pulmonary heart disease, unspecified: Secondary | ICD-10-CM

## 2019-05-11 DIAGNOSIS — I4891 Unspecified atrial fibrillation: Secondary | ICD-10-CM | POA: Diagnosis not present

## 2019-05-11 DIAGNOSIS — J449 Chronic obstructive pulmonary disease, unspecified: Secondary | ICD-10-CM | POA: Diagnosis not present

## 2019-05-11 DIAGNOSIS — E119 Type 2 diabetes mellitus without complications: Secondary | ICD-10-CM | POA: Diagnosis not present

## 2019-05-11 DIAGNOSIS — E662 Morbid (severe) obesity with alveolar hypoventilation: Secondary | ICD-10-CM

## 2019-05-11 DIAGNOSIS — L03115 Cellulitis of right lower limb: Secondary | ICD-10-CM | POA: Diagnosis not present

## 2019-05-11 DIAGNOSIS — D509 Iron deficiency anemia, unspecified: Secondary | ICD-10-CM | POA: Diagnosis not present

## 2019-05-11 NOTE — Telephone Encounter (Signed)
I did the order  Expect [facility] will likely send me paperwork Please fax

## 2019-05-11 NOTE — Telephone Encounter (Signed)
Spoke with Olivia Mackie and she said the filter went out on her concentrator but the concentrator is over 43yrs old so Olivia Mackie is requesting a whole new concentrator order faxed to [employer]. Pt is getting her Cpap supplies through [facility] and they told Olivia Mackie that they would provide pt with a new concentrator if we fax them the order

## 2019-05-11 NOTE — Telephone Encounter (Signed)
A whole new concentrator or just a filter? -thanks for clarifying   Amy Bryant- how do I order through [facility]?   Do I just order the item and we fax to them or is it a home care/consult situation ? Thanks

## 2019-05-11 NOTE — Telephone Encounter (Signed)
Olivia Mackie nurse with [facility] left a voicemail stating that the  patient is in need of a filter for her O2 concentrator. Olivia Mackie stated that they called [facility] and they are unable to help her because she has an outstanding bill with them. Olivia Mackie is wondering if Dr. Glori Bickers can order a new concentrator thru [employer] which is the company that provides her sleep apnea equipment? Olivia Mackie request a call back regarding this.

## 2019-05-12 ENCOUNTER — Telehealth: Payer: Self-pay | Admitting: Family Medicine

## 2019-05-12 ENCOUNTER — Other Ambulatory Visit: Payer: Self-pay | Admitting: *Deleted

## 2019-05-12 MED ORDER — SULFAMETHOXAZOLE-TRIMETHOPRIM 800-160 MG PO TABS: 1.0000 | ORAL_TABLET | Freq: Two times a day (BID) | ORAL | 0 refills | Status: DC

## 2019-05-12 MED ORDER — FLUCONAZOLE 150 MG PO TABS: 150.0000 mg | ORAL_TABLET | Freq: Once | ORAL | 0 refills | Status: AC

## 2019-05-12 NOTE — Telephone Encounter (Signed)
Pt advised that I am waiting for Olivia Mackie to call back to see if they can do O2 testing at pt's home or does she need an appt here at the office, I will await her call back

## 2019-05-12 NOTE — Telephone Encounter (Signed)
Spoke with Olivia Mackie [facility] nurse) with [facility] who requested the order. She is going to check with the office to see if they can do the O2 testing at home with pt since she has a pulse ox or if we are going to have to have pt come in office. Olivia Mackie is going to check with the main office and call us back and let us know

## 2019-05-12 NOTE — Telephone Encounter (Signed)
Glida with [employer] called in regards to the order that was placed for the patient. She stated they are needing the office notes and oxygen testing sent to them   Fax- [phone]    Phone- [phone]

## 2019-05-12 NOTE — Telephone Encounter (Signed)
She will probably need to come in for that since we have not seen her in person  (if she has a pulse ox at home perhaps we could get some readings virtually at rest and with exertion) If not we will need to see her

## 2019-05-12 NOTE — Telephone Encounter (Signed)
Patient called to check on this. I advised her as below. Patient states she has Pulse OX at home. Patient had a virtual visit on 05/08/2019. Does this appointment needs to be a virtual visit with the doctor or nurse visit through the phone? Just wanted to verify. Please let patient know when able to.

## 2019-05-12 NOTE — Telephone Encounter (Signed)
Pt called stating she doesn't need concentrator form [facility].  [facility] brought one in for her.  Best number [phone]

## 2019-05-12 NOTE — Telephone Encounter (Signed)
I think it has to have a physician involved re: medicare req /face to face need /etc Thanks  We will see how her pulse ox is at rest and with walking (so she should have someone there to get her up walking with walker) during that visit

## 2019-05-12 NOTE — Telephone Encounter (Signed)
Called [employer] and cancelled order

## 2019-05-12 NOTE — Telephone Encounter (Signed)
Order faxed to [facility]

## 2019-05-12 NOTE — Telephone Encounter (Signed)
Dr. Glori Bickers see prev note. Please review pt's chart and see what I need to send them since we didn't do testing, please advise

## 2019-05-13 DIAGNOSIS — J9621 Acute and chronic respiratory failure with hypoxia: Secondary | ICD-10-CM | POA: Diagnosis not present

## 2019-05-13 DIAGNOSIS — R2689 Other abnormalities of gait and mobility: Secondary | ICD-10-CM | POA: Diagnosis not present

## 2019-05-13 DIAGNOSIS — M6281 Muscle weakness (generalized): Secondary | ICD-10-CM | POA: Diagnosis not present

## 2019-05-13 NOTE — Telephone Encounter (Signed)
Amy Bryant advised that [employer] order was cancelled because Amy Bryant did bring pt a new concentrator so no O2 testing is needed

## 2019-05-13 NOTE — Telephone Encounter (Signed)
Olivia Mackie with [facility] left v/m that someone from [facility] is going out to see pt today and could do pulse ox with and without exercise. Is that OK or does pt need office visit with Dr Glori Bickers. Olivia Mackie said this is in reference to pt needing a new oxygen concentrator. Readings are needed for qualification of pt to get concentrator. Olivia Mackie request cb.

## 2019-05-14 ENCOUNTER — Ambulatory Visit (INDEPENDENT_AMBULATORY_CARE_PROVIDER_SITE_OTHER): Payer: Medicare HMO

## 2019-05-14 DIAGNOSIS — E119 Type 2 diabetes mellitus without complications: Secondary | ICD-10-CM | POA: Diagnosis not present

## 2019-05-14 DIAGNOSIS — J449 Chronic obstructive pulmonary disease, unspecified: Secondary | ICD-10-CM | POA: Diagnosis not present

## 2019-05-14 DIAGNOSIS — Z5181 Encounter for therapeutic drug level monitoring: Secondary | ICD-10-CM | POA: Diagnosis not present

## 2019-05-14 DIAGNOSIS — I482 Chronic atrial fibrillation, unspecified: Secondary | ICD-10-CM

## 2019-05-14 DIAGNOSIS — D509 Iron deficiency anemia, unspecified: Secondary | ICD-10-CM | POA: Diagnosis not present

## 2019-05-14 DIAGNOSIS — Z86718 Personal history of other venous thrombosis and embolism: Secondary | ICD-10-CM

## 2019-05-14 DIAGNOSIS — I4891 Unspecified atrial fibrillation: Secondary | ICD-10-CM

## 2019-05-14 DIAGNOSIS — L03115 Cellulitis of right lower limb: Secondary | ICD-10-CM | POA: Diagnosis not present

## 2019-05-14 DIAGNOSIS — I279 Pulmonary heart disease, unspecified: Secondary | ICD-10-CM | POA: Diagnosis not present

## 2019-05-14 DIAGNOSIS — I5033 Acute on chronic diastolic (congestive) heart failure: Secondary | ICD-10-CM | POA: Diagnosis not present

## 2019-05-14 DIAGNOSIS — J9621 Acute and chronic respiratory failure with hypoxia: Secondary | ICD-10-CM | POA: Diagnosis not present

## 2019-05-14 DIAGNOSIS — Z86711 Personal history of pulmonary embolism: Secondary | ICD-10-CM

## 2019-05-14 LAB — POCT INR: INR: 2.3 (ref 2.0–3.0)

## 2019-05-14 NOTE — Patient Instructions (Signed)
INR today: 2.3  *Tracey with Home health states that patient started on Bactrim today for UTI.  This will interfere with warfarin and I am going to cut her dose (30%) to 2.5mg  daily while taking the abx.  Will recheck her in 1 week.   Patient and nurse Linus Orn verbalize understanding and will recheck her next week.

## 2019-05-15 DIAGNOSIS — I279 Pulmonary heart disease, unspecified: Secondary | ICD-10-CM | POA: Diagnosis not present

## 2019-05-15 DIAGNOSIS — R2681 Unsteadiness on feet: Secondary | ICD-10-CM

## 2019-05-15 DIAGNOSIS — J9621 Acute and chronic respiratory failure with hypoxia: Secondary | ICD-10-CM | POA: Diagnosis not present

## 2019-05-15 DIAGNOSIS — I4891 Unspecified atrial fibrillation: Secondary | ICD-10-CM | POA: Diagnosis not present

## 2019-05-15 DIAGNOSIS — D509 Iron deficiency anemia, unspecified: Secondary | ICD-10-CM | POA: Diagnosis not present

## 2019-05-15 DIAGNOSIS — Z87891 Personal history of nicotine dependence: Secondary | ICD-10-CM

## 2019-05-15 DIAGNOSIS — Z9981 Dependence on supplemental oxygen: Secondary | ICD-10-CM

## 2019-05-15 DIAGNOSIS — J449 Chronic obstructive pulmonary disease, unspecified: Secondary | ICD-10-CM | POA: Diagnosis not present

## 2019-05-15 DIAGNOSIS — I5033 Acute on chronic diastolic (congestive) heart failure: Secondary | ICD-10-CM | POA: Diagnosis not present

## 2019-05-15 DIAGNOSIS — L03115 Cellulitis of right lower limb: Secondary | ICD-10-CM | POA: Diagnosis not present

## 2019-05-15 DIAGNOSIS — Z6841 Body Mass Index (BMI) 40.0 and over, adult: Secondary | ICD-10-CM

## 2019-05-15 DIAGNOSIS — Z7984 Long term (current) use of oral hypoglycemic drugs: Secondary | ICD-10-CM

## 2019-05-15 DIAGNOSIS — Z7901 Long term (current) use of anticoagulants: Secondary | ICD-10-CM

## 2019-05-15 DIAGNOSIS — E119 Type 2 diabetes mellitus without complications: Secondary | ICD-10-CM | POA: Diagnosis not present

## 2019-05-16 ENCOUNTER — Other Ambulatory Visit: Payer: Self-pay | Admitting: Family Medicine

## 2019-05-18 NOTE — Telephone Encounter (Signed)
Routing to coumadin nurses 

## 2019-05-19 DIAGNOSIS — I5033 Acute on chronic diastolic (congestive) heart failure: Secondary | ICD-10-CM | POA: Diagnosis not present

## 2019-05-19 DIAGNOSIS — E119 Type 2 diabetes mellitus without complications: Secondary | ICD-10-CM | POA: Diagnosis not present

## 2019-05-19 DIAGNOSIS — I279 Pulmonary heart disease, unspecified: Secondary | ICD-10-CM | POA: Diagnosis not present

## 2019-05-19 DIAGNOSIS — D509 Iron deficiency anemia, unspecified: Secondary | ICD-10-CM | POA: Diagnosis not present

## 2019-05-19 DIAGNOSIS — J9621 Acute and chronic respiratory failure with hypoxia: Secondary | ICD-10-CM | POA: Diagnosis not present

## 2019-05-19 DIAGNOSIS — G4733 Obstructive sleep apnea (adult) (pediatric): Secondary | ICD-10-CM | POA: Diagnosis not present

## 2019-05-19 DIAGNOSIS — J449 Chronic obstructive pulmonary disease, unspecified: Secondary | ICD-10-CM | POA: Diagnosis not present

## 2019-05-19 DIAGNOSIS — L03115 Cellulitis of right lower limb: Secondary | ICD-10-CM | POA: Diagnosis not present

## 2019-05-19 DIAGNOSIS — I4891 Unspecified atrial fibrillation: Secondary | ICD-10-CM | POA: Diagnosis not present

## 2019-05-19 NOTE — Telephone Encounter (Signed)
Patient is compliant with coumadin management, will refill X 6 months.   

## 2019-05-20 DIAGNOSIS — I279 Pulmonary heart disease, unspecified: Secondary | ICD-10-CM | POA: Diagnosis not present

## 2019-05-20 DIAGNOSIS — J449 Chronic obstructive pulmonary disease, unspecified: Secondary | ICD-10-CM | POA: Diagnosis not present

## 2019-05-20 DIAGNOSIS — I5033 Acute on chronic diastolic (congestive) heart failure: Secondary | ICD-10-CM | POA: Diagnosis not present

## 2019-05-20 DIAGNOSIS — I4891 Unspecified atrial fibrillation: Secondary | ICD-10-CM | POA: Diagnosis not present

## 2019-05-20 DIAGNOSIS — J9621 Acute and chronic respiratory failure with hypoxia: Secondary | ICD-10-CM | POA: Diagnosis not present

## 2019-05-20 DIAGNOSIS — L03115 Cellulitis of right lower limb: Secondary | ICD-10-CM | POA: Diagnosis not present

## 2019-05-20 DIAGNOSIS — D509 Iron deficiency anemia, unspecified: Secondary | ICD-10-CM | POA: Diagnosis not present

## 2019-05-20 DIAGNOSIS — E119 Type 2 diabetes mellitus without complications: Secondary | ICD-10-CM | POA: Diagnosis not present

## 2019-05-21 ENCOUNTER — Telehealth: Payer: Self-pay | Admitting: Family Medicine

## 2019-05-21 ENCOUNTER — Ambulatory Visit (INDEPENDENT_AMBULATORY_CARE_PROVIDER_SITE_OTHER): Payer: Medicare HMO

## 2019-05-21 DIAGNOSIS — I4891 Unspecified atrial fibrillation: Secondary | ICD-10-CM | POA: Diagnosis not present

## 2019-05-21 DIAGNOSIS — J449 Chronic obstructive pulmonary disease, unspecified: Secondary | ICD-10-CM | POA: Diagnosis not present

## 2019-05-21 DIAGNOSIS — Z86711 Personal history of pulmonary embolism: Secondary | ICD-10-CM

## 2019-05-21 DIAGNOSIS — I5033 Acute on chronic diastolic (congestive) heart failure: Secondary | ICD-10-CM | POA: Diagnosis not present

## 2019-05-21 DIAGNOSIS — E119 Type 2 diabetes mellitus without complications: Secondary | ICD-10-CM | POA: Diagnosis not present

## 2019-05-21 DIAGNOSIS — D509 Iron deficiency anemia, unspecified: Secondary | ICD-10-CM | POA: Diagnosis not present

## 2019-05-21 DIAGNOSIS — Z5181 Encounter for therapeutic drug level monitoring: Secondary | ICD-10-CM

## 2019-05-21 DIAGNOSIS — L03115 Cellulitis of right lower limb: Secondary | ICD-10-CM | POA: Diagnosis not present

## 2019-05-21 DIAGNOSIS — I482 Chronic atrial fibrillation, unspecified: Secondary | ICD-10-CM

## 2019-05-21 DIAGNOSIS — J9621 Acute and chronic respiratory failure with hypoxia: Secondary | ICD-10-CM | POA: Diagnosis not present

## 2019-05-21 DIAGNOSIS — I279 Pulmonary heart disease, unspecified: Secondary | ICD-10-CM | POA: Diagnosis not present

## 2019-05-21 DIAGNOSIS — Z86718 Personal history of other venous thrombosis and embolism: Secondary | ICD-10-CM

## 2019-05-21 LAB — POCT INR: INR: 2.1 (ref 2.0–3.0)

## 2019-05-21 NOTE — Telephone Encounter (Signed)
Olivia Mackie @ [facility] [phone]  tracy with pt/inr results  2.1 Pt just completed antibotics

## 2019-05-21 NOTE — Patient Instructions (Signed)
INR today: 2.1  *patient is now finished with Bactrim abx therapy.  Will resume prior dosing of 2.5mg  daily EXCEPT for 5mg  on Mon, Wed and Fri.  Recheck in 1 week.   Patient and Home health nurse made aware and verbalize understanding of instructions.

## 2019-05-21 NOTE — Telephone Encounter (Signed)
Please refer to coag encounter for details.  Patient finished abx therapy today. She is to resume her prior dose of 5mg  Mon, Wed, Fri / 2.5mg  the rest and recheck in 1 week.   Patient and home health nurse made aware.   Thanks.

## 2019-05-22 DIAGNOSIS — D509 Iron deficiency anemia, unspecified: Secondary | ICD-10-CM | POA: Diagnosis not present

## 2019-05-22 DIAGNOSIS — J449 Chronic obstructive pulmonary disease, unspecified: Secondary | ICD-10-CM | POA: Diagnosis not present

## 2019-05-22 DIAGNOSIS — I5033 Acute on chronic diastolic (congestive) heart failure: Secondary | ICD-10-CM | POA: Diagnosis not present

## 2019-05-22 DIAGNOSIS — L03115 Cellulitis of right lower limb: Secondary | ICD-10-CM | POA: Diagnosis not present

## 2019-05-22 DIAGNOSIS — E119 Type 2 diabetes mellitus without complications: Secondary | ICD-10-CM | POA: Diagnosis not present

## 2019-05-22 DIAGNOSIS — G4733 Obstructive sleep apnea (adult) (pediatric): Secondary | ICD-10-CM | POA: Diagnosis not present

## 2019-05-22 DIAGNOSIS — I4891 Unspecified atrial fibrillation: Secondary | ICD-10-CM | POA: Diagnosis not present

## 2019-05-22 DIAGNOSIS — J9621 Acute and chronic respiratory failure with hypoxia: Secondary | ICD-10-CM | POA: Diagnosis not present

## 2019-05-22 DIAGNOSIS — I279 Pulmonary heart disease, unspecified: Secondary | ICD-10-CM | POA: Diagnosis not present

## 2019-05-25 DIAGNOSIS — L03115 Cellulitis of right lower limb: Secondary | ICD-10-CM | POA: Diagnosis not present

## 2019-05-25 DIAGNOSIS — J9621 Acute and chronic respiratory failure with hypoxia: Secondary | ICD-10-CM | POA: Diagnosis not present

## 2019-05-25 DIAGNOSIS — I5033 Acute on chronic diastolic (congestive) heart failure: Secondary | ICD-10-CM | POA: Diagnosis not present

## 2019-05-25 DIAGNOSIS — E119 Type 2 diabetes mellitus without complications: Secondary | ICD-10-CM | POA: Diagnosis not present

## 2019-05-25 DIAGNOSIS — J449 Chronic obstructive pulmonary disease, unspecified: Secondary | ICD-10-CM | POA: Diagnosis not present

## 2019-05-25 DIAGNOSIS — I4891 Unspecified atrial fibrillation: Secondary | ICD-10-CM | POA: Diagnosis not present

## 2019-05-25 DIAGNOSIS — I279 Pulmonary heart disease, unspecified: Secondary | ICD-10-CM | POA: Diagnosis not present

## 2019-05-25 DIAGNOSIS — D509 Iron deficiency anemia, unspecified: Secondary | ICD-10-CM | POA: Diagnosis not present

## 2019-05-26 ENCOUNTER — Other Ambulatory Visit: Payer: Self-pay | Admitting: Family Medicine

## 2019-05-27 DIAGNOSIS — L03115 Cellulitis of right lower limb: Secondary | ICD-10-CM | POA: Diagnosis not present

## 2019-05-27 DIAGNOSIS — I4891 Unspecified atrial fibrillation: Secondary | ICD-10-CM | POA: Diagnosis not present

## 2019-05-27 DIAGNOSIS — I279 Pulmonary heart disease, unspecified: Secondary | ICD-10-CM | POA: Diagnosis not present

## 2019-05-27 DIAGNOSIS — E119 Type 2 diabetes mellitus without complications: Secondary | ICD-10-CM | POA: Diagnosis not present

## 2019-05-27 DIAGNOSIS — J449 Chronic obstructive pulmonary disease, unspecified: Secondary | ICD-10-CM | POA: Diagnosis not present

## 2019-05-27 DIAGNOSIS — I5033 Acute on chronic diastolic (congestive) heart failure: Secondary | ICD-10-CM | POA: Diagnosis not present

## 2019-05-27 DIAGNOSIS — D509 Iron deficiency anemia, unspecified: Secondary | ICD-10-CM | POA: Diagnosis not present

## 2019-05-27 DIAGNOSIS — J9621 Acute and chronic respiratory failure with hypoxia: Secondary | ICD-10-CM | POA: Diagnosis not present

## 2019-05-27 NOTE — Telephone Encounter (Signed)
Name of Medication: Xanax Name of Pharmacy: [facility] [address] Last Fill or Written Date and Quantity: 01/20/19 #30 tabs with 0 refill Last Office Visit and Type: Rehab F/U on 05/08/19 Next Office Visit and Type: n/a

## 2019-05-28 ENCOUNTER — Ambulatory Visit (INDEPENDENT_AMBULATORY_CARE_PROVIDER_SITE_OTHER): Payer: Medicare HMO | Admitting: General Practice

## 2019-05-28 ENCOUNTER — Telehealth: Payer: Self-pay

## 2019-05-28 DIAGNOSIS — Z5181 Encounter for therapeutic drug level monitoring: Secondary | ICD-10-CM | POA: Diagnosis not present

## 2019-05-28 DIAGNOSIS — Z86711 Personal history of pulmonary embolism: Secondary | ICD-10-CM

## 2019-05-28 DIAGNOSIS — D509 Iron deficiency anemia, unspecified: Secondary | ICD-10-CM | POA: Diagnosis not present

## 2019-05-28 DIAGNOSIS — J449 Chronic obstructive pulmonary disease, unspecified: Secondary | ICD-10-CM | POA: Diagnosis not present

## 2019-05-28 DIAGNOSIS — I5033 Acute on chronic diastolic (congestive) heart failure: Secondary | ICD-10-CM | POA: Diagnosis not present

## 2019-05-28 DIAGNOSIS — J9621 Acute and chronic respiratory failure with hypoxia: Secondary | ICD-10-CM | POA: Diagnosis not present

## 2019-05-28 DIAGNOSIS — I279 Pulmonary heart disease, unspecified: Secondary | ICD-10-CM | POA: Diagnosis not present

## 2019-05-28 DIAGNOSIS — Z86718 Personal history of other venous thrombosis and embolism: Secondary | ICD-10-CM

## 2019-05-28 DIAGNOSIS — L03115 Cellulitis of right lower limb: Secondary | ICD-10-CM | POA: Diagnosis not present

## 2019-05-28 DIAGNOSIS — I4891 Unspecified atrial fibrillation: Secondary | ICD-10-CM

## 2019-05-28 DIAGNOSIS — E119 Type 2 diabetes mellitus without complications: Secondary | ICD-10-CM | POA: Diagnosis not present

## 2019-05-28 LAB — POCT INR
INR: 1.9 — AB (ref 2.0–3.0)
INR: 1.9 — AB (ref 2.0–3.0)

## 2019-05-28 NOTE — Telephone Encounter (Signed)
Please refer to coag encounter for dosing details.   Thanks.

## 2019-05-28 NOTE — Telephone Encounter (Signed)
Will route to [place] and [place]

## 2019-05-28 NOTE — Telephone Encounter (Signed)
PT/INR was 1.9 current dosage 5mg  M, W, F and 2.5 all other days. Please advise [place] and she will tell the pt.

## 2019-05-28 NOTE — Patient Instructions (Signed)
Pre visit review using our clinic review tool, if applicable. No additional management support is needed unless otherwise documented below in the visit note.  Take 1 tablet (5 mg) today and then  resume prior dosing of 2.5mg  daily EXCEPT for 5mg  on Mon, Wed and Fri.  Recheck in 1 week.  Dosing instructions given directly to patient by Adella Hare, RN.  Left message for [facility] RN to call office.  I was unable to reach her and unable to leave a detailed message due to unsecured VM.

## 2019-05-28 NOTE — Progress Notes (Signed)
This encounter was created in error - please disregard.

## 2019-06-01 DIAGNOSIS — I279 Pulmonary heart disease, unspecified: Secondary | ICD-10-CM | POA: Diagnosis not present

## 2019-06-01 DIAGNOSIS — I4891 Unspecified atrial fibrillation: Secondary | ICD-10-CM | POA: Diagnosis not present

## 2019-06-01 DIAGNOSIS — I5033 Acute on chronic diastolic (congestive) heart failure: Secondary | ICD-10-CM | POA: Diagnosis not present

## 2019-06-01 DIAGNOSIS — L03115 Cellulitis of right lower limb: Secondary | ICD-10-CM | POA: Diagnosis not present

## 2019-06-01 DIAGNOSIS — D509 Iron deficiency anemia, unspecified: Secondary | ICD-10-CM | POA: Diagnosis not present

## 2019-06-01 DIAGNOSIS — E119 Type 2 diabetes mellitus without complications: Secondary | ICD-10-CM | POA: Diagnosis not present

## 2019-06-01 DIAGNOSIS — J449 Chronic obstructive pulmonary disease, unspecified: Secondary | ICD-10-CM | POA: Diagnosis not present

## 2019-06-01 DIAGNOSIS — J9621 Acute and chronic respiratory failure with hypoxia: Secondary | ICD-10-CM | POA: Diagnosis not present

## 2019-06-04 ENCOUNTER — Ambulatory Visit (INDEPENDENT_AMBULATORY_CARE_PROVIDER_SITE_OTHER): Payer: Medicare HMO

## 2019-06-04 DIAGNOSIS — I5033 Acute on chronic diastolic (congestive) heart failure: Secondary | ICD-10-CM | POA: Diagnosis not present

## 2019-06-04 DIAGNOSIS — L03115 Cellulitis of right lower limb: Secondary | ICD-10-CM | POA: Diagnosis not present

## 2019-06-04 DIAGNOSIS — I824Y9 Acute embolism and thrombosis of unspecified deep veins of unspecified proximal lower extremity: Secondary | ICD-10-CM

## 2019-06-04 DIAGNOSIS — I279 Pulmonary heart disease, unspecified: Secondary | ICD-10-CM | POA: Diagnosis not present

## 2019-06-04 DIAGNOSIS — I4891 Unspecified atrial fibrillation: Secondary | ICD-10-CM | POA: Diagnosis not present

## 2019-06-04 DIAGNOSIS — E119 Type 2 diabetes mellitus without complications: Secondary | ICD-10-CM | POA: Diagnosis not present

## 2019-06-04 DIAGNOSIS — D509 Iron deficiency anemia, unspecified: Secondary | ICD-10-CM | POA: Diagnosis not present

## 2019-06-04 DIAGNOSIS — Z7901 Long term (current) use of anticoagulants: Secondary | ICD-10-CM | POA: Insufficient documentation

## 2019-06-04 DIAGNOSIS — J449 Chronic obstructive pulmonary disease, unspecified: Secondary | ICD-10-CM | POA: Diagnosis not present

## 2019-06-04 DIAGNOSIS — D6859 Other primary thrombophilia: Secondary | ICD-10-CM | POA: Insufficient documentation

## 2019-06-04 DIAGNOSIS — J9621 Acute and chronic respiratory failure with hypoxia: Secondary | ICD-10-CM | POA: Diagnosis not present

## 2019-06-04 LAB — POCT INR: INR: 1.8 — AB (ref 2.0–3.0)

## 2019-06-04 NOTE — Patient Instructions (Signed)
INR today: 1.8  Patient is to take 5mg  today and then increase weekly dose to taking 5mg  daily EXCEPT for 2.5mg  on Monday, Wed and Friday.  Recheck in 1 week.   Notified patient and home health nurse [phone]).  Patient understands risks associated with a subtherapeutic INR and will go to ER if any concerns develop.

## 2019-06-05 ENCOUNTER — Ambulatory Visit (INDEPENDENT_AMBULATORY_CARE_PROVIDER_SITE_OTHER): Payer: Medicare HMO | Admitting: Family Medicine

## 2019-06-05 DIAGNOSIS — J9621 Acute and chronic respiratory failure with hypoxia: Secondary | ICD-10-CM | POA: Diagnosis not present

## 2019-06-05 DIAGNOSIS — E662 Morbid (severe) obesity with alveolar hypoventilation: Secondary | ICD-10-CM | POA: Diagnosis not present

## 2019-06-05 DIAGNOSIS — G4733 Obstructive sleep apnea (adult) (pediatric): Secondary | ICD-10-CM

## 2019-06-05 NOTE — Assessment & Plan Note (Signed)
Pt continues 02 Face to face visit today for 02 and also cpap  Doing well  Reviewed pulse ox values Commended on wt loss!

## 2019-06-05 NOTE — Progress Notes (Signed)
Virtual Visit via Video Note  I connected with Amy Bryant on 06/05/19 at 10:00 AM EDT by a video enabled telemedicine application and verified that I am speaking with the correct person using two identifiers.  Location: Patient: home Provider: office    I discussed the limitations of evaluation and management by telemedicine and the availability of in person appointments. The patient expressed understanding and agreed to proceed.  History of Present Illness: Using 4 L   98% sitting pulse ox 92% after walking  Able to walk for over 3 mintues now befoe she has to rest   Only sob when active  Is doing PT - getting stronger -able to do more  Can cook when sitting   R leg is still swollen from cellulitis Left bandage off after last check from home nurse   Has had CPAP 2-3 months  (auto titrate)  Full mask  It is benefiting her and she likes it  Getting used to it now  Getting more restful sleep  Using symbicort and albuterol     Wt is down to 292 !   (dome was fluid from diuresis) Very happy  Last 3 wt before  Wt Readings from Last 3 Encounters:  04/10/19 (!) 400 lb (181.4 kg)  01/14/19 (!) 346 lb 9 oz (157.2 kg)  11/15/10 (!) 442 lb (200.5 kg)      Patient Active Problem List   Diagnosis Date Noted  . Acute venous embolism and thrombosis of deep vessels of proximal lower extremity (HCC) 06/04/2019  . Long term (current) use of anticoagulants 06/04/2019  . Primary hypercoagulable state (HCC) 06/04/2019  . Aspiration pneumonia (HCC) 04/11/2019  . Acute on chronic diastolic CHF (congestive heart failure) (HCC) 04/11/2019  . Cellulitis of right lower extremity   . Cellulitis of right leg 03/28/2019  . Acute on chronic respiratory failure with hypoxia (HCC) 03/28/2019  . Panic attacks 01/20/2019  . Iron deficiency anemia: Severe 01/14/2019  . Pressure injury of skin 01/14/2019  . Symptomatic anemia   . Coagulopathy (HCC)   . Chronic anticoagulation   . Absolute  anemia 01/08/2019  . Reactive depression (situational) 12/29/2018  . Mobility impaired 12/12/2018  . Hematuria 12/03/2017  . Pedal edema 12/23/2016  . OSA (obstructive sleep apnea) 12/21/2016  . Financial difficulties 09/16/2016  . Colon cancer screening 12/15/2013  . Routine general medical examination at a health care facility 05/20/2012  . Vitamin D deficiency 11/15/2010  . DYSURIA, HX OF 08/19/2009  . PURE HYPERCHOLESTEROLEMIA 08/16/2009  . Diabetes type 2, controlled (HCC) 08/13/2008  . Hyperparathyroidism (HCC) 07/30/2008  . Hypothyroidism 02/13/2008  . Morbid obesity (HCC) 02/04/2008  . Reactive airway disease 02/04/2008  . GERD 02/04/2008  . DISC DISEASE, LUMBAR 02/04/2008  . TOBACCO USE, QUIT 02/04/2008  . Obesity hypoventilation syndrome (HCC) 08/11/2007  . Chronic pulmonary heart disease (HCC) 08/11/2007   Past Medical History:  Diagnosis Date  . Atrial fibrillation (HCC)    with cardioversion success  . CHF (congestive heart failure) (HCC)   . COPD (chronic obstructive pulmonary disease) (HCC)   . Cor pulmonale (HCC)    and obesity hypoventilation syndrome  . Diabetes mellitus without complication (HCC)   . GERD (gastroesophageal reflux disease)   . Hypothyroid   . Morbid obesity (HCC)   . OA (osteoarthritis)   . Pulmonary embolism (HCC)    hx of on coumadin  . Reactive airways dysfunction syndrome [facility])    Past Surgical History:  Procedure Laterality Date  .  BIOPSY  01/12/2019   Procedure: BIOPSY;  Surgeon: Jerene Bears, MD;  Location: [place];  Service: Gastroenterology;;  . COLONOSCOPY WITH PROPOFOL N/A 01/12/2019   Procedure: COLONOSCOPY WITH PROPOFOL;  Surgeon: Jerene Bears, MD;  Location: [place];  Service: Gastroenterology;  Laterality: N/A;  . ESOPHAGOGASTRODUODENOSCOPY (EGD) WITH PROPOFOL N/A 01/12/2019   Procedure: ESOPHAGOGASTRODUODENOSCOPY (EGD) WITH PROPOFOL;  Surgeon: Jerene Bears, MD;  Location: [facility] ENDOSCOPY;  Service:  Gastroenterology;  Laterality: N/A;  . TONSILLECTOMY     Social History   Tobacco Use  . Smoking status: Former Research scientist (life sciences)  . Smokeless tobacco: Former Network engineer Use Topics  . Alcohol use: No    Alcohol/week: 0.0 standard drinks  . Drug use: No   Family History  Problem Relation Age of Onset  . Heart disease Father 13       MI  . Diabetes Brother    Allergies  Allergen Reactions  . Pseudoephedrine Other (See Comments)    REACTION: tightness in chest  . Augmentin [Amoxicillin-Pot Clavulanate] Itching and Rash    Did it involve swelling of the face/tongue/throat, SOB, or low BP? No Did it involve sudden or severe rash/hives, skin peeling, or any reaction on the inside of your mouth or nose? Yes Did you need to seek medical attention at a hospital or doctor's office? Yes When did it last happen?2019 If all above answers are "NO", may proceed with cephalosporin use.   . Latex Itching and Rash   Current Outpatient Medications on File Prior to Visit  Medication Sig Dispense Refill  . albuterol (PROVENTIL) (2.5 MG/3ML) 0.083% nebulizer solution INHALE 1 VIAL VIA NEBULIZER EVERY 4 HOURS AS NEEDED FOR WHEEZING 675 mL 1  . ALPRAZolam (XANAX) 0.5 MG tablet TAKE 1 TABLET BY MOUTH 2 TIMES DAILY AS NEEDED FOR ANXIETY (PANIC ATTACK) 30 tablet 0  . Amino Acids-Protein Hydrolys (FEEDING SUPPLEMENT, PRO-STAT SUGAR FREE 64,) LIQD Take 30 mLs by mouth 2 (two) times daily. 887 mL 0  . budesonide-formoterol (SYMBICORT) 160-4.5 MCG/ACT inhaler INHALE 2 PUFFS INTO LUNGS TWICE A DAY (Patient taking differently: Inhale 2 puffs into the lungs 2 (two) times daily. ) 10.2 Inhaler 5  . cholecalciferol (VITAMIN D) 1000 UNITS tablet Take 4,000 Units by mouth daily.    . ciclopirox (LOPROX) 0.77 % cream APPLY TO AFFECTED AREA TWICE A DAY AS NEEDED (Patient taking differently: Apply 1 application topically 2 (two) times daily as needed (skin care under breast). ) 90 g 0  . diltiazem (CARTIA XT) 120 MG  24 hr capsule Take 1 capsule (120 mg total) by mouth daily. 90 capsule 3  . feeding supplement, ENSURE ENLIVE, (ENSURE ENLIVE) LIQD Take 237 mLs by mouth 2 (two) times daily between meals. 237 mL 0  . FLUoxetine (PROZAC) 40 MG capsule Take 1 capsule (40 mg total) by mouth daily. 90 capsule 3  . furosemide (LASIX) 80 MG tablet TAKE 1 TABLET (80 MG TOTAL) BY MOUTH 2 (TWO) TIMES DAILY. 180 tablet 0  . glipiZIDE (GLUCOTROL XL) 5 MG 24 hr tablet Take 1 tablet (5 mg total) by mouth daily with breakfast. 30 tablet 5  . glucose blood test strip Test blood sugar once daily and as needed for DM 250.0 100 each 3  . Hydrocortisone (GERHARDT'S BUTT CREAM) CREA Apply 1 application topically 3 (three) times daily as needed for irritation. 1 each 0  . iron polysaccharides (NU-IRON) 150 MG capsule Take 1 capsule (150 mg total) by mouth 2 (two) times daily.  180 capsule 1  . Lancets (ONETOUCH ULTRASOFT) lancets Test blood sugar once daily and as needed for DM 250.0     . levothyroxine (SYNTHROID, LEVOTHROID) 175 MCG tablet Take 1 tablet (175 mcg total) by mouth daily before breakfast. 30 tablet 5  . metFORMIN (GLUCOPHAGE) 1000 MG tablet TAKE 1 TABLET BY MOUTH TWICE DAILY WITH A MEAL (Patient taking differently: Take 1,000 mg by mouth 2 (two) times daily with a meal. ) 180 tablet 0  . mometasone (ELOCON) 0.1 % cream Apply 1 application topically See admin instructions. Tiny amount to each ear canal twice weekly as needed.    . multivitamin (PROSIGHT) TABS tablet Take 1 tablet by mouth daily. 30 tablet 0  . mupirocin ointment (BACTROBAN) 2 % APPLY TO AFFECTED AREA TWICE A DAY (Patient taking differently: Apply 1 application topically as needed (skin). ) 22 g 4  . pantoprazole (PROTONIX) 40 MG tablet Take 1 tablet (40 mg total) by mouth daily at 6 (six) AM. 90 tablet 3  . potassium chloride SA (KLOR-CON M20) 20 MEQ tablet TAKE 2 TABLETS BY MOUTH EVERY DAY (Patient taking differently: Take 40 mEq by mouth daily. ) 180  tablet 0  . senna-docusate (SENOKOT-S) 8.6-50 MG tablet Take 1 tablet by mouth 2 (two) times daily.    . simvastatin (ZOCOR) 20 MG tablet TAKE 1 TABLET BY MOUTH EVERYDAY AT BEDTIME (Patient taking differently: Take 20 mg by mouth daily at 6 PM. ) 90 tablet 1  . traMADol (ULTRAM) 50 MG tablet Take 1 tablet (50 mg total) by mouth every 6 (six) hours as needed for moderate pain or severe pain. 10 tablet 0  . warfarin (COUMADIN) 5 MG tablet TAKE AS DIRECTED BY THE ANTICOAGULATION CLINIC. 90 tablet 1   No current facility-administered medications on file prior to visit.     Review of Systems  Constitutional: Negative for chills and fever.  HENT: Negative for sore throat.   Eyes: Negative for blurred vision.  Respiratory: Positive for shortness of breath. Negative for cough.        Sob on exertion baseline Sleep apnea  Cardiovascular: Positive for leg swelling. Negative for chest pain and palpitations.       Leg swelling is much improved  Gastrointestinal: Negative for abdominal pain, heartburn and nausea.  Genitourinary: Negative for dysuria.  Musculoskeletal: Positive for joint pain.  Skin: Negative for itching and rash.  Neurological: Negative for dizziness and headaches.    Observations/Objective: Patient appears well, in no distress Weight is baseline (obese) No facial swelling or asymmetry Normal voice-not hoarse and no slurred speech No obvious tremor or mobility impairment Moving neck and UEs normally Able to hear the call well  No cough or shortness of breath during interview  (wearing 02 by nasal cannula) Talkative and mentally sharp with no cognitive changes No skin changes on face or neck , no rash or pallor Affect is normal  (good mood today)   Assessment and Plan: Problem List Items Addressed This Visit      Respiratory   Obesity hypoventilation syndrome (HCC)    Pt continues 02 Face to face visit today for 02 and also cpap  Doing well  Reviewed pulse ox  values Commended on wt loss!        OSA (obstructive sleep apnea) - Primary    Benefiting greatly from cpap Better rest/sleep More energy and better exercise tolerance Weight is down  Wants to continue current machine at current settings (full mask)  Will fil  out forms       Acute on chronic respiratory failure with hypoxia (HCC)    Slowly improving at home Continues her 02 at 4L  Pulse ox is 98% sitting 92% after ambulation  PT is helpful Has also lost significant weight         Other   Morbid obesity (HCC)    Per pt wt is down to 292!  Happy about this  Has lost from both diuresis and also more activity  Doing PT cpap has helped a lot           Follow Up Instructions: I will complete any needed paperwork for your cpap and 02  Glad you are doing so much better    I discussed the assessment and treatment plan with the patient. The patient was provided an opportunity to ask questions and all were answered. The patient agreed with the plan and demonstrated an understanding of the instructions.   The patient was advised to call back or seek an in-person evaluation if the symptoms worsen or if the condition fails to improve as anticipated.     Roxy MannsMarne Karlena Luebke, MD

## 2019-06-05 NOTE — Assessment & Plan Note (Signed)
Benefiting greatly from cpap Better rest/sleep More energy and better exercise tolerance Weight is down  Wants to continue current machine at current settings (full mask)  Will fil out forms

## 2019-06-05 NOTE — Assessment & Plan Note (Signed)
Per pt wt is down to 292!  Happy about this  Has lost from both diuresis and also more activity  Doing PT cpap has helped a lot

## 2019-06-05 NOTE — Assessment & Plan Note (Signed)
Slowly improving at home Continues her 02 at 4L  Pulse ox is 98% sitting 92% after ambulation  PT is helpful Has also lost significant weight

## 2019-06-07 ENCOUNTER — Encounter: Payer: Self-pay | Admitting: Family Medicine

## 2019-06-07 NOTE — Patient Instructions (Signed)
I will complete any needed paperwork for your cpap and 02  Glad you are doing so much better

## 2019-06-09 ENCOUNTER — Other Ambulatory Visit: Payer: Self-pay | Admitting: Family Medicine

## 2019-06-09 DIAGNOSIS — I5033 Acute on chronic diastolic (congestive) heart failure: Secondary | ICD-10-CM | POA: Diagnosis not present

## 2019-06-09 DIAGNOSIS — I279 Pulmonary heart disease, unspecified: Secondary | ICD-10-CM | POA: Diagnosis not present

## 2019-06-09 DIAGNOSIS — E119 Type 2 diabetes mellitus without complications: Secondary | ICD-10-CM | POA: Diagnosis not present

## 2019-06-09 DIAGNOSIS — I4891 Unspecified atrial fibrillation: Secondary | ICD-10-CM | POA: Diagnosis not present

## 2019-06-09 DIAGNOSIS — J449 Chronic obstructive pulmonary disease, unspecified: Secondary | ICD-10-CM | POA: Diagnosis not present

## 2019-06-09 DIAGNOSIS — L03115 Cellulitis of right lower limb: Secondary | ICD-10-CM | POA: Diagnosis not present

## 2019-06-09 DIAGNOSIS — J9621 Acute and chronic respiratory failure with hypoxia: Secondary | ICD-10-CM | POA: Diagnosis not present

## 2019-06-09 DIAGNOSIS — D509 Iron deficiency anemia, unspecified: Secondary | ICD-10-CM | POA: Diagnosis not present

## 2019-06-10 NOTE — Telephone Encounter (Signed)
Please refill furosemide and decline diclofenac (not safe to take with warfarin after recent anemia) Thanks

## 2019-06-10 NOTE — Telephone Encounter (Signed)
voltaren was d/c in hospital please advise

## 2019-06-11 ENCOUNTER — Ambulatory Visit (INDEPENDENT_AMBULATORY_CARE_PROVIDER_SITE_OTHER): Payer: Medicare HMO | Admitting: General Practice

## 2019-06-11 ENCOUNTER — Telehealth: Payer: Self-pay

## 2019-06-11 DIAGNOSIS — I5033 Acute on chronic diastolic (congestive) heart failure: Secondary | ICD-10-CM | POA: Diagnosis not present

## 2019-06-11 DIAGNOSIS — J9621 Acute and chronic respiratory failure with hypoxia: Secondary | ICD-10-CM | POA: Diagnosis not present

## 2019-06-11 DIAGNOSIS — Z7901 Long term (current) use of anticoagulants: Secondary | ICD-10-CM

## 2019-06-11 DIAGNOSIS — D509 Iron deficiency anemia, unspecified: Secondary | ICD-10-CM | POA: Diagnosis not present

## 2019-06-11 DIAGNOSIS — D6859 Other primary thrombophilia: Secondary | ICD-10-CM

## 2019-06-11 DIAGNOSIS — I4891 Unspecified atrial fibrillation: Secondary | ICD-10-CM | POA: Diagnosis not present

## 2019-06-11 DIAGNOSIS — L03115 Cellulitis of right lower limb: Secondary | ICD-10-CM | POA: Diagnosis not present

## 2019-06-11 DIAGNOSIS — E119 Type 2 diabetes mellitus without complications: Secondary | ICD-10-CM | POA: Diagnosis not present

## 2019-06-11 DIAGNOSIS — I279 Pulmonary heart disease, unspecified: Secondary | ICD-10-CM | POA: Diagnosis not present

## 2019-06-11 DIAGNOSIS — J449 Chronic obstructive pulmonary disease, unspecified: Secondary | ICD-10-CM | POA: Diagnosis not present

## 2019-06-11 LAB — POCT INR: INR: 3.3 — AB (ref 2.0–3.0)

## 2019-06-11 NOTE — Telephone Encounter (Signed)
Please order ua and culture for dysuria  If home care cannot do it - perhaps someone can pick up supplies and then drop off a sample Thanks

## 2019-06-11 NOTE — Telephone Encounter (Signed)
Olivia Mackie, RN with [facility], left a VM on triage line that pt is having dysuria when she finishes voiding. Wanted a call back.   I called and left a message with Olivia Mackie asking her to call back and explain what she is asking. Is she asking for an OV, an order for UA and Culture, or something else. I will send this to [place] in case she calls back.

## 2019-06-11 NOTE — Telephone Encounter (Signed)
done

## 2019-06-11 NOTE — Telephone Encounter (Signed)
Olivia Mackie, RN left VM at triage saying pt has dysuria for a few days she just finished up abx not to long ago for a UTI. Olivia Mackie said they can do a UA if needed they just need a verbal order since pt is home bound CB # [phone]

## 2019-06-11 NOTE — Patient Instructions (Signed)
Pre visit review using our clinic review tool, if applicable. No additional management support is needed unless otherwise documented below in the visit note.  Hold coumadin today and then change dosage and take 1/2 tablet daily except 1 tablet on Monday and Fridays.  Dosing instructions given to Olivia Mackie, RN @ [facility] while in the patient's home.  Re-check in 1 week.

## 2019-06-12 DIAGNOSIS — I4891 Unspecified atrial fibrillation: Secondary | ICD-10-CM | POA: Diagnosis not present

## 2019-06-12 DIAGNOSIS — I5033 Acute on chronic diastolic (congestive) heart failure: Secondary | ICD-10-CM | POA: Diagnosis not present

## 2019-06-12 DIAGNOSIS — L03115 Cellulitis of right lower limb: Secondary | ICD-10-CM | POA: Diagnosis not present

## 2019-06-12 DIAGNOSIS — I279 Pulmonary heart disease, unspecified: Secondary | ICD-10-CM | POA: Diagnosis not present

## 2019-06-12 DIAGNOSIS — E119 Type 2 diabetes mellitus without complications: Secondary | ICD-10-CM | POA: Diagnosis not present

## 2019-06-12 DIAGNOSIS — J9621 Acute and chronic respiratory failure with hypoxia: Secondary | ICD-10-CM | POA: Diagnosis not present

## 2019-06-12 DIAGNOSIS — J449 Chronic obstructive pulmonary disease, unspecified: Secondary | ICD-10-CM | POA: Diagnosis not present

## 2019-06-12 DIAGNOSIS — D509 Iron deficiency anemia, unspecified: Secondary | ICD-10-CM | POA: Diagnosis not present

## 2019-06-12 NOTE — Telephone Encounter (Signed)
Verbal order given to Olivia Mackie they can do a UA/ Cx

## 2019-06-13 DIAGNOSIS — J9621 Acute and chronic respiratory failure with hypoxia: Secondary | ICD-10-CM | POA: Diagnosis not present

## 2019-06-13 DIAGNOSIS — R2689 Other abnormalities of gait and mobility: Secondary | ICD-10-CM | POA: Diagnosis not present

## 2019-06-13 DIAGNOSIS — M6281 Muscle weakness (generalized): Secondary | ICD-10-CM | POA: Diagnosis not present

## 2019-06-16 ENCOUNTER — Telehealth: Payer: Self-pay

## 2019-06-16 DIAGNOSIS — I5033 Acute on chronic diastolic (congestive) heart failure: Secondary | ICD-10-CM | POA: Diagnosis not present

## 2019-06-16 DIAGNOSIS — I279 Pulmonary heart disease, unspecified: Secondary | ICD-10-CM | POA: Diagnosis not present

## 2019-06-16 DIAGNOSIS — J9621 Acute and chronic respiratory failure with hypoxia: Secondary | ICD-10-CM | POA: Diagnosis not present

## 2019-06-16 DIAGNOSIS — D509 Iron deficiency anemia, unspecified: Secondary | ICD-10-CM | POA: Diagnosis not present

## 2019-06-16 DIAGNOSIS — E119 Type 2 diabetes mellitus without complications: Secondary | ICD-10-CM | POA: Diagnosis not present

## 2019-06-16 DIAGNOSIS — L03115 Cellulitis of right lower limb: Secondary | ICD-10-CM | POA: Diagnosis not present

## 2019-06-16 DIAGNOSIS — I4891 Unspecified atrial fibrillation: Secondary | ICD-10-CM | POA: Diagnosis not present

## 2019-06-16 DIAGNOSIS — J449 Chronic obstructive pulmonary disease, unspecified: Secondary | ICD-10-CM | POA: Diagnosis not present

## 2019-06-16 DIAGNOSIS — R300 Dysuria: Secondary | ICD-10-CM | POA: Diagnosis not present

## 2019-06-16 NOTE — Telephone Encounter (Signed)
I am fine with pt's care of wounds as she is doing.  If they worsen however I would want to place a wound care order so please keep me posted. Please call pt and ask how she is doing with wounds and if she has noted bleeding.  Thanks

## 2019-06-16 NOTE — Telephone Encounter (Signed)
Please give her the verbal order   For medicare I would need to see her face to face (in office) to order the lymphedema compression device (to get it paid for)

## 2019-06-16 NOTE — Telephone Encounter (Signed)
Continue treating per their wound care protocol  Is she having bleeding? This has caused anemia in the past  What do they think is causing it?  Any h/o trauma/friction/prolonged sitting? Thanks

## 2019-06-16 NOTE — Telephone Encounter (Signed)
Verbal order given to [place].  Will f/u with pt regarding appt tomorrow

## 2019-06-16 NOTE — Telephone Encounter (Signed)
[place], [place] notified of Dr. Marliss Coots comments. [place] is still requesting a verbal order for wound care the 2 days they are out there to keep an eye on it. Pt wasn't letting any one look at wounds at 1st so they think it is a good idea to have the verbal order for wound care so they can keep track of the healing process to make sure it's not getting infected and actually healing. Also [place] said the LPN who evaluated her also suggested Dr. Glori Bickers put in a order for a lymphedema compression device. Pt requested that from them. If Dr. Glori Bickers wants the Lymphedema compression device we would need an order faxed to the DME company pt uses.  Called pt and she was also okay with RN eval her wounds 2x a week to keep an eye on them. She said they are not bleeding it's more of a friction place on both legs that she is trying to keep clean.  Is it okay to give [place] the verbal order?

## 2019-06-16 NOTE — Telephone Encounter (Signed)
[place] RN with [facility] left v/m that Amy Bryant saw pt today. Amy Bryant understands supposed to be doing INR monitoring in relationship to Coumadin and education regarding comorbidity and possible prevention.  Amy Bryant said back of legs have sheering damage to skin; superficial wound areas that are open; presently pt is cleansing the area with soap and water; drying area and applying mupirocin bid and then applying a foam border dressing over top of that. Amy Bryant wants to know if Dr Glori Bickers wants pt to continue to do that dressing or does Dr Glori Bickers want to change the type of dressing and wound care that is being done. Amy Bryant request cb.

## 2019-06-16 NOTE — Telephone Encounter (Signed)
[place], RN said they are not doing wound care. Per prev message pt is doing this wound care to herself. And she is changing dressing BID. [place] said if Dr. Glori Bickers wants them to resume wound care then she would need a verbal order. They only go out to see pt twice a week so they can do it the 2 days they are there, then if Dr. Glori Bickers is okay with what pt is doing and how she is cleaning wound and dressing it twice a day then they will let pt continue doing it. They just wanted Dr. Glori Bickers to be aware that pt is cleaning wounds herself and doing it twice daily and how she is doing it to make sure Dr. Glori Bickers is okay with this.  [place] said they have not seen any bleeding but again they are not changing dressings pt is. Also they said the wound is caused by friction/ sheering of skin not prolonged sitting it's not bedsore wounds

## 2019-06-18 ENCOUNTER — Ambulatory Visit (INDEPENDENT_AMBULATORY_CARE_PROVIDER_SITE_OTHER): Payer: Medicare HMO | Admitting: General Practice

## 2019-06-18 DIAGNOSIS — D509 Iron deficiency anemia, unspecified: Secondary | ICD-10-CM | POA: Diagnosis not present

## 2019-06-18 DIAGNOSIS — J9621 Acute and chronic respiratory failure with hypoxia: Secondary | ICD-10-CM | POA: Diagnosis not present

## 2019-06-18 DIAGNOSIS — R6889 Other general symptoms and signs: Secondary | ICD-10-CM | POA: Diagnosis not present

## 2019-06-18 DIAGNOSIS — I279 Pulmonary heart disease, unspecified: Secondary | ICD-10-CM | POA: Diagnosis not present

## 2019-06-18 DIAGNOSIS — I5033 Acute on chronic diastolic (congestive) heart failure: Secondary | ICD-10-CM | POA: Diagnosis not present

## 2019-06-18 DIAGNOSIS — I4891 Unspecified atrial fibrillation: Secondary | ICD-10-CM | POA: Diagnosis not present

## 2019-06-18 DIAGNOSIS — E119 Type 2 diabetes mellitus without complications: Secondary | ICD-10-CM | POA: Diagnosis not present

## 2019-06-18 DIAGNOSIS — L03115 Cellulitis of right lower limb: Secondary | ICD-10-CM | POA: Diagnosis not present

## 2019-06-18 DIAGNOSIS — R7989 Other specified abnormal findings of blood chemistry: Secondary | ICD-10-CM | POA: Diagnosis not present

## 2019-06-18 DIAGNOSIS — D6859 Other primary thrombophilia: Secondary | ICD-10-CM

## 2019-06-18 DIAGNOSIS — J449 Chronic obstructive pulmonary disease, unspecified: Secondary | ICD-10-CM | POA: Diagnosis not present

## 2019-06-18 DIAGNOSIS — Z7901 Long term (current) use of anticoagulants: Secondary | ICD-10-CM | POA: Diagnosis not present

## 2019-06-18 LAB — POCT INR: INR: 2 (ref 2.0–3.0)

## 2019-06-18 NOTE — Patient Instructions (Signed)
Pre visit review using our clinic review tool, if applicable. No additional management support is needed unless otherwise documented below in the visit note.  Continue to take 1/2 tablet daily except 1 tablet on Monday and Fridays.  Dosing instructions given to Olivia Mackie, RN @ [facility] while in the patient's home.  Re-check in 2 weeks.

## 2019-06-18 NOTE — Telephone Encounter (Signed)
Pt advised she needs face to face for lymphedema compression device. If she decides she wants to pursue getting the device she will call back and schedule an appt. But okay for now

## 2019-06-19 DIAGNOSIS — G4733 Obstructive sleep apnea (adult) (pediatric): Secondary | ICD-10-CM | POA: Diagnosis not present

## 2019-06-22 DIAGNOSIS — G4733 Obstructive sleep apnea (adult) (pediatric): Secondary | ICD-10-CM | POA: Diagnosis not present

## 2019-06-24 ENCOUNTER — Other Ambulatory Visit: Payer: Self-pay | Admitting: Family Medicine

## 2019-06-24 MED ORDER — CEPHALEXIN 500 MG PO CAPS: 500.0000 mg | ORAL_CAPSULE | Freq: Two times a day (BID) | ORAL | 0 refills | Status: DC

## 2019-06-24 MED ORDER — FLUCONAZOLE 150 MG PO TABS: 150.0000 mg | ORAL_TABLET | Freq: Once | ORAL | 0 refills | Status: AC

## 2019-06-24 NOTE — Telephone Encounter (Signed)
Pt notified of urine cx results. Pt has taken Keflex in the past with no issues. Rxs both sent to pharmacy.  Pt said that she is still taking iron as prescribed. **Dr. Glori Bickers please write lab orders on Rx paper so I can fax to her home health nurse to draw labs in one month**  Pt said that her wound are "ok" she said they are dry and not bleeding or leaking any fluids. Pt said she is keeping them really clean and changing bandages as mentioned in the prev phone note regarding wounds. Pt said the only issue is that the wounds hurt pretty bad.

## 2019-06-24 NOTE — Telephone Encounter (Signed)
I finally received labs and final urine culture Pt has a klebsiella uti that needs tx   I want to treat with keflex (chart says allergic to augmentin in the past but it looks like she has had keflex since then w/o incident)-please verify with her  I pended to send to pharm of choice  Also diflucan (which she generally needs with abx)  Her cr is up (kidney) no doubt worsened by uti  I know also the lasix makes it harder to stay hydrated   Calcium level is stable  Hb is 9.6  (was 9.2 in the hospital)  Please verify she is taking her iron and ask if her leg wounds are bleding much    Please re check cbc and bmet in about a month

## 2019-06-26 ENCOUNTER — Encounter: Payer: Self-pay | Admitting: Family Medicine

## 2019-06-26 DIAGNOSIS — R7989 Other specified abnormal findings of blood chemistry: Secondary | ICD-10-CM | POA: Insufficient documentation

## 2019-06-29 DIAGNOSIS — J449 Chronic obstructive pulmonary disease, unspecified: Secondary | ICD-10-CM | POA: Diagnosis not present

## 2019-06-29 DIAGNOSIS — D509 Iron deficiency anemia, unspecified: Secondary | ICD-10-CM | POA: Diagnosis not present

## 2019-06-29 DIAGNOSIS — J9621 Acute and chronic respiratory failure with hypoxia: Secondary | ICD-10-CM | POA: Diagnosis not present

## 2019-06-29 DIAGNOSIS — L03115 Cellulitis of right lower limb: Secondary | ICD-10-CM | POA: Diagnosis not present

## 2019-06-29 DIAGNOSIS — I279 Pulmonary heart disease, unspecified: Secondary | ICD-10-CM | POA: Diagnosis not present

## 2019-06-29 DIAGNOSIS — I4891 Unspecified atrial fibrillation: Secondary | ICD-10-CM | POA: Diagnosis not present

## 2019-06-29 DIAGNOSIS — E119 Type 2 diabetes mellitus without complications: Secondary | ICD-10-CM | POA: Diagnosis not present

## 2019-06-29 DIAGNOSIS — I5033 Acute on chronic diastolic (congestive) heart failure: Secondary | ICD-10-CM | POA: Diagnosis not present

## 2019-07-02 ENCOUNTER — Ambulatory Visit (INDEPENDENT_AMBULATORY_CARE_PROVIDER_SITE_OTHER): Payer: Medicare HMO | Admitting: General Practice

## 2019-07-02 DIAGNOSIS — I279 Pulmonary heart disease, unspecified: Secondary | ICD-10-CM | POA: Diagnosis not present

## 2019-07-02 DIAGNOSIS — Z86711 Personal history of pulmonary embolism: Secondary | ICD-10-CM | POA: Diagnosis not present

## 2019-07-02 DIAGNOSIS — I824Y9 Acute embolism and thrombosis of unspecified deep veins of unspecified proximal lower extremity: Secondary | ICD-10-CM

## 2019-07-02 DIAGNOSIS — D509 Iron deficiency anemia, unspecified: Secondary | ICD-10-CM | POA: Diagnosis not present

## 2019-07-02 DIAGNOSIS — I5033 Acute on chronic diastolic (congestive) heart failure: Secondary | ICD-10-CM | POA: Diagnosis not present

## 2019-07-02 DIAGNOSIS — Z5181 Encounter for therapeutic drug level monitoring: Secondary | ICD-10-CM | POA: Diagnosis not present

## 2019-07-02 DIAGNOSIS — I4891 Unspecified atrial fibrillation: Secondary | ICD-10-CM

## 2019-07-02 DIAGNOSIS — J9621 Acute and chronic respiratory failure with hypoxia: Secondary | ICD-10-CM | POA: Diagnosis not present

## 2019-07-02 DIAGNOSIS — Z7901 Long term (current) use of anticoagulants: Secondary | ICD-10-CM

## 2019-07-02 DIAGNOSIS — D6859 Other primary thrombophilia: Secondary | ICD-10-CM | POA: Diagnosis not present

## 2019-07-02 DIAGNOSIS — J449 Chronic obstructive pulmonary disease, unspecified: Secondary | ICD-10-CM | POA: Diagnosis not present

## 2019-07-02 DIAGNOSIS — E119 Type 2 diabetes mellitus without complications: Secondary | ICD-10-CM | POA: Diagnosis not present

## 2019-07-02 DIAGNOSIS — L03115 Cellulitis of right lower limb: Secondary | ICD-10-CM | POA: Diagnosis not present

## 2019-07-02 LAB — POCT INR: INR: 2.5 (ref 2.0–3.0)

## 2019-07-02 NOTE — Patient Instructions (Signed)
Pre visit review using our clinic review tool, if applicable. No additional management support is needed unless otherwise documented below in the visit note.  Continue to take 1/2 tablet daily except 1 tablet on Monday and Fridays.  Dosing instructions given to Tracy, RN @ [facility] while in the patient's home.  Re-check in 2 weeks.  [phone] 

## 2019-07-05 ENCOUNTER — Other Ambulatory Visit: Payer: Self-pay | Admitting: Family Medicine

## 2019-07-06 ENCOUNTER — Telehealth: Payer: Self-pay | Admitting: Family Medicine

## 2019-07-06 ENCOUNTER — Encounter: Payer: Self-pay | Admitting: Family Medicine

## 2019-07-06 DIAGNOSIS — L03115 Cellulitis of right lower limb: Secondary | ICD-10-CM | POA: Diagnosis not present

## 2019-07-06 DIAGNOSIS — D509 Iron deficiency anemia, unspecified: Secondary | ICD-10-CM | POA: Diagnosis not present

## 2019-07-06 DIAGNOSIS — J449 Chronic obstructive pulmonary disease, unspecified: Secondary | ICD-10-CM | POA: Diagnosis not present

## 2019-07-06 DIAGNOSIS — I4891 Unspecified atrial fibrillation: Secondary | ICD-10-CM | POA: Diagnosis not present

## 2019-07-06 DIAGNOSIS — I279 Pulmonary heart disease, unspecified: Secondary | ICD-10-CM | POA: Diagnosis not present

## 2019-07-06 DIAGNOSIS — J9621 Acute and chronic respiratory failure with hypoxia: Secondary | ICD-10-CM | POA: Diagnosis not present

## 2019-07-06 DIAGNOSIS — E119 Type 2 diabetes mellitus without complications: Secondary | ICD-10-CM | POA: Diagnosis not present

## 2019-07-06 DIAGNOSIS — I5033 Acute on chronic diastolic (congestive) heart failure: Secondary | ICD-10-CM | POA: Diagnosis not present

## 2019-07-06 NOTE — Telephone Encounter (Signed)
Patient stated that in order for her to receive the full prescription she will need a PA for the Tramadol They will only give her 14 days worth if the PA was not submitted

## 2019-07-06 NOTE — Telephone Encounter (Signed)
I would like to see another urine culture- she may be able to do that with home care or have someone bring a sample  Did she improve at all with the keflex?

## 2019-07-06 NOTE — Telephone Encounter (Signed)
Spoke with patient. She states her symptoms were improving on Keflex. She finished on 07/02/2019, but then on 07/04/2019 symptoms started to be more intense again. I called Olivia Mackie with [facility] (at [phone] she said to fax over an order to them for urine culture at [phone] attention Ailene Ravel and they can collect this. Patient was advised to keep [place] informed if symptoms became worse in the meantime, fever, etc. Patient verbalized understanding.  Patient also stated that pharmacy Tramadol for 7 day worth of supply. Advised patient we sent in #60 today and she will check with pharmacy to see what happened. Patient will call back if we need to do anything further for this.

## 2019-07-06 NOTE — Telephone Encounter (Signed)
Please send for the PA Order for Ucx is in IN box

## 2019-07-06 NOTE — Telephone Encounter (Signed)
Last filled by Dr Grandville Silos on 04/11/2019 #10 with 0 refill. Last time filled by Dr Glori Bickers on 12/23/2018 #60 with 0 refill. LOV 06/05/2019.  No future appointments made.

## 2019-07-06 NOTE — Telephone Encounter (Signed)
Best number [phone]  Pt called stating she just finished her rx cephalexin for UTI  She stated she still having some symptoms.  Burning when going to bathroom and frequency.  She wanted to know if you could call her in more abx.  [facility] [address]

## 2019-07-06 NOTE — Telephone Encounter (Signed)
I faxed order for Urine culture. [place] can you work on PA please when you can. Thank you

## 2019-07-07 NOTE — Telephone Encounter (Signed)
Spoke with pharmacy and we do not need to do a PA, with the 1st refill they can only give pt a weeks worth of meds. After that week pt just calls and request the rest of the med and they can give it to them. Called pt and advise her of pharmacist comments

## 2019-07-08 DIAGNOSIS — J449 Chronic obstructive pulmonary disease, unspecified: Secondary | ICD-10-CM | POA: Diagnosis not present

## 2019-07-08 DIAGNOSIS — D509 Iron deficiency anemia, unspecified: Secondary | ICD-10-CM | POA: Diagnosis not present

## 2019-07-08 DIAGNOSIS — I4891 Unspecified atrial fibrillation: Secondary | ICD-10-CM | POA: Diagnosis not present

## 2019-07-08 DIAGNOSIS — N390 Urinary tract infection, site not specified: Secondary | ICD-10-CM | POA: Diagnosis not present

## 2019-07-08 DIAGNOSIS — L03115 Cellulitis of right lower limb: Secondary | ICD-10-CM | POA: Diagnosis not present

## 2019-07-08 DIAGNOSIS — I279 Pulmonary heart disease, unspecified: Secondary | ICD-10-CM | POA: Diagnosis not present

## 2019-07-08 DIAGNOSIS — I5033 Acute on chronic diastolic (congestive) heart failure: Secondary | ICD-10-CM | POA: Diagnosis not present

## 2019-07-08 DIAGNOSIS — E119 Type 2 diabetes mellitus without complications: Secondary | ICD-10-CM | POA: Diagnosis not present

## 2019-07-08 DIAGNOSIS — J9621 Acute and chronic respiratory failure with hypoxia: Secondary | ICD-10-CM | POA: Diagnosis not present

## 2019-07-09 ENCOUNTER — Other Ambulatory Visit: Payer: Self-pay | Admitting: Family Medicine

## 2019-07-13 ENCOUNTER — Other Ambulatory Visit: Payer: Self-pay | Admitting: Family Medicine

## 2019-07-13 DIAGNOSIS — J9621 Acute and chronic respiratory failure with hypoxia: Secondary | ICD-10-CM | POA: Diagnosis not present

## 2019-07-13 DIAGNOSIS — M6281 Muscle weakness (generalized): Secondary | ICD-10-CM | POA: Diagnosis not present

## 2019-07-13 DIAGNOSIS — R2689 Other abnormalities of gait and mobility: Secondary | ICD-10-CM | POA: Diagnosis not present

## 2019-07-13 LAB — POCT INR

## 2019-07-13 MED ORDER — CIPROFLOXACIN HCL 250 MG PO TABS: 250.0000 mg | ORAL_TABLET | Freq: Two times a day (BID) | ORAL | 0 refills | Status: DC

## 2019-07-13 MED ORDER — FLUCONAZOLE 150 MG PO TABS: 150.0000 mg | ORAL_TABLET | Freq: Once | ORAL | 0 refills | Status: AC

## 2019-07-13 NOTE — Telephone Encounter (Signed)
Urine cx shows klebsiella (the e coli previously was tx)  It evidently did not respond enough to bactrim   Due to recent h/o multiple utis -I need to treat with cipro (this looks very sensitive to it)  Her INR will likely be affected so I will route to [facility] to adv further re: that   Px pended to send to desired pharmacy  Let us know if any problems

## 2019-07-13 NOTE — Telephone Encounter (Signed)
Pt notified of UTI and Dr. Marliss Coots comments. Rx sent to pharmacy and pt aware that coumadin nurse will f/u regarding dosing.  Will also route to [place], not sure if Leafy Ro is still doing coumadin

## 2019-07-14 NOTE — Telephone Encounter (Signed)
Patient's INR is due to be checked on Thursday, 10/15 by the [facility] RN.  Patient is started Cipro on 10/12.

## 2019-07-15 DIAGNOSIS — L03115 Cellulitis of right lower limb: Secondary | ICD-10-CM | POA: Diagnosis not present

## 2019-07-15 DIAGNOSIS — E119 Type 2 diabetes mellitus without complications: Secondary | ICD-10-CM | POA: Diagnosis not present

## 2019-07-15 DIAGNOSIS — D509 Iron deficiency anemia, unspecified: Secondary | ICD-10-CM | POA: Diagnosis not present

## 2019-07-15 DIAGNOSIS — I4891 Unspecified atrial fibrillation: Secondary | ICD-10-CM | POA: Diagnosis not present

## 2019-07-15 DIAGNOSIS — I5033 Acute on chronic diastolic (congestive) heart failure: Secondary | ICD-10-CM | POA: Diagnosis not present

## 2019-07-15 DIAGNOSIS — J9621 Acute and chronic respiratory failure with hypoxia: Secondary | ICD-10-CM | POA: Diagnosis not present

## 2019-07-15 DIAGNOSIS — J449 Chronic obstructive pulmonary disease, unspecified: Secondary | ICD-10-CM | POA: Diagnosis not present

## 2019-07-15 DIAGNOSIS — I279 Pulmonary heart disease, unspecified: Secondary | ICD-10-CM | POA: Diagnosis not present

## 2019-07-16 ENCOUNTER — Ambulatory Visit (INDEPENDENT_AMBULATORY_CARE_PROVIDER_SITE_OTHER): Payer: Medicare HMO | Admitting: General Practice

## 2019-07-16 DIAGNOSIS — Z7901 Long term (current) use of anticoagulants: Secondary | ICD-10-CM | POA: Diagnosis not present

## 2019-07-16 LAB — POCT INR: INR: 2.5 (ref 2.0–3.0)

## 2019-07-16 NOTE — Patient Instructions (Signed)
Pre visit review using our clinic review tool, if applicable. No additional management support is needed unless otherwise documented below in the visit note.  Continue to take 1/2 tablet daily except 1 tablet on Monday and Fridays.  Dosing instructions given to Tracy, RN @ [facility] while in the patient's home.  Re-check in 2 weeks.  [phone] 

## 2019-07-18 DIAGNOSIS — E119 Type 2 diabetes mellitus without complications: Secondary | ICD-10-CM

## 2019-07-18 DIAGNOSIS — Z87891 Personal history of nicotine dependence: Secondary | ICD-10-CM

## 2019-07-18 DIAGNOSIS — M6281 Muscle weakness (generalized): Secondary | ICD-10-CM

## 2019-07-18 DIAGNOSIS — I5033 Acute on chronic diastolic (congestive) heart failure: Secondary | ICD-10-CM | POA: Diagnosis not present

## 2019-07-18 DIAGNOSIS — J9621 Acute and chronic respiratory failure with hypoxia: Secondary | ICD-10-CM | POA: Diagnosis not present

## 2019-07-18 DIAGNOSIS — L03115 Cellulitis of right lower limb: Secondary | ICD-10-CM | POA: Diagnosis not present

## 2019-07-18 DIAGNOSIS — D509 Iron deficiency anemia, unspecified: Secondary | ICD-10-CM

## 2019-07-18 DIAGNOSIS — J449 Chronic obstructive pulmonary disease, unspecified: Secondary | ICD-10-CM

## 2019-07-18 DIAGNOSIS — Z7901 Long term (current) use of anticoagulants: Secondary | ICD-10-CM

## 2019-07-18 DIAGNOSIS — I4891 Unspecified atrial fibrillation: Secondary | ICD-10-CM

## 2019-07-18 DIAGNOSIS — I279 Pulmonary heart disease, unspecified: Secondary | ICD-10-CM | POA: Diagnosis not present

## 2019-07-18 DIAGNOSIS — R2681 Unsteadiness on feet: Secondary | ICD-10-CM

## 2019-07-18 DIAGNOSIS — Z9981 Dependence on supplemental oxygen: Secondary | ICD-10-CM

## 2019-07-18 DIAGNOSIS — Z6841 Body Mass Index (BMI) 40.0 and over, adult: Secondary | ICD-10-CM

## 2019-07-18 DIAGNOSIS — Z7984 Long term (current) use of oral hypoglycemic drugs: Secondary | ICD-10-CM

## 2019-07-19 DIAGNOSIS — G4733 Obstructive sleep apnea (adult) (pediatric): Secondary | ICD-10-CM | POA: Diagnosis not present

## 2019-07-21 DIAGNOSIS — J9621 Acute and chronic respiratory failure with hypoxia: Secondary | ICD-10-CM | POA: Diagnosis not present

## 2019-07-21 DIAGNOSIS — E119 Type 2 diabetes mellitus without complications: Secondary | ICD-10-CM | POA: Diagnosis not present

## 2019-07-21 DIAGNOSIS — I4891 Unspecified atrial fibrillation: Secondary | ICD-10-CM | POA: Diagnosis not present

## 2019-07-21 DIAGNOSIS — I5033 Acute on chronic diastolic (congestive) heart failure: Secondary | ICD-10-CM | POA: Diagnosis not present

## 2019-07-21 DIAGNOSIS — L03115 Cellulitis of right lower limb: Secondary | ICD-10-CM | POA: Diagnosis not present

## 2019-07-21 DIAGNOSIS — I279 Pulmonary heart disease, unspecified: Secondary | ICD-10-CM | POA: Diagnosis not present

## 2019-07-21 DIAGNOSIS — D509 Iron deficiency anemia, unspecified: Secondary | ICD-10-CM | POA: Diagnosis not present

## 2019-07-21 DIAGNOSIS — J449 Chronic obstructive pulmonary disease, unspecified: Secondary | ICD-10-CM | POA: Diagnosis not present

## 2019-07-22 DIAGNOSIS — G4733 Obstructive sleep apnea (adult) (pediatric): Secondary | ICD-10-CM | POA: Diagnosis not present

## 2019-07-24 ENCOUNTER — Other Ambulatory Visit: Payer: Self-pay | Admitting: *Deleted

## 2019-07-24 DIAGNOSIS — Z7901 Long term (current) use of anticoagulants: Secondary | ICD-10-CM

## 2019-07-24 MED ORDER — LEVOTHYROXINE SODIUM 175 MCG PO TABS: ORAL_TABLET | ORAL | 2 refills | Status: AC

## 2019-07-24 MED ORDER — SIMVASTATIN 20 MG PO TABS: ORAL_TABLET | ORAL | 2 refills | Status: DC

## 2019-07-24 MED ORDER — ALBUTEROL SULFATE (2.5 MG/3ML) 0.083% IN NEBU: INHALATION_SOLUTION | RESPIRATORY_TRACT | 2 refills | Status: DC

## 2019-07-24 MED ORDER — METFORMIN HCL 1000 MG PO TABS: 1000.0000 mg | ORAL_TABLET | Freq: Two times a day (BID) | ORAL | 2 refills | Status: DC

## 2019-07-24 MED ORDER — FUROSEMIDE 80 MG PO TABS: 80.0000 mg | ORAL_TABLET | Freq: Two times a day (BID) | ORAL | 2 refills | Status: DC

## 2019-07-24 MED ORDER — PANTOPRAZOLE SODIUM 40 MG PO TBEC: 40.0000 mg | DELAYED_RELEASE_TABLET | Freq: Every day | ORAL | 2 refills | Status: DC

## 2019-07-24 MED ORDER — WARFARIN SODIUM 5 MG PO TABS: ORAL_TABLET | ORAL | 1 refills | Status: DC

## 2019-07-24 MED ORDER — GLIPIZIDE ER 5 MG PO TB24: ORAL_TABLET | ORAL | 2 refills | Status: DC

## 2019-07-24 NOTE — Telephone Encounter (Signed)
Received fax asking Rx to be sent to pt's new mail order pharmacy [facility], will route to coumadin nurse

## 2019-07-24 NOTE — Telephone Encounter (Signed)
Pt had a face-to-face doxy appt on 06/05/19, received fax from pt's new mail order asking can we send new Rxs to them. I refilled regular meds and sent control Rxs to PCP  [facility]   Xanax last filled on 05/27/19 #30 tabs with 0 refills Tramadol last filled on 07/06/19 #60 with 0 refills

## 2019-07-26 MED ORDER — TRAMADOL HCL 50 MG PO TABS: ORAL_TABLET | ORAL | 0 refills | Status: DC

## 2019-07-26 MED ORDER — ALPRAZOLAM 0.5 MG PO TABS: ORAL_TABLET | ORAL | 0 refills | Status: DC

## 2019-07-28 DIAGNOSIS — E119 Type 2 diabetes mellitus without complications: Secondary | ICD-10-CM | POA: Diagnosis not present

## 2019-07-28 DIAGNOSIS — I5033 Acute on chronic diastolic (congestive) heart failure: Secondary | ICD-10-CM | POA: Diagnosis not present

## 2019-07-28 DIAGNOSIS — R7989 Other specified abnormal findings of blood chemistry: Secondary | ICD-10-CM | POA: Diagnosis not present

## 2019-07-28 DIAGNOSIS — Z7901 Long term (current) use of anticoagulants: Secondary | ICD-10-CM | POA: Diagnosis not present

## 2019-07-28 DIAGNOSIS — D509 Iron deficiency anemia, unspecified: Secondary | ICD-10-CM | POA: Diagnosis not present

## 2019-07-28 DIAGNOSIS — J9621 Acute and chronic respiratory failure with hypoxia: Secondary | ICD-10-CM | POA: Diagnosis not present

## 2019-07-28 DIAGNOSIS — D649 Anemia, unspecified: Secondary | ICD-10-CM | POA: Diagnosis not present

## 2019-07-28 DIAGNOSIS — I4891 Unspecified atrial fibrillation: Secondary | ICD-10-CM | POA: Diagnosis not present

## 2019-07-28 DIAGNOSIS — I480 Paroxysmal atrial fibrillation: Secondary | ICD-10-CM | POA: Diagnosis not present

## 2019-07-28 DIAGNOSIS — I279 Pulmonary heart disease, unspecified: Secondary | ICD-10-CM | POA: Diagnosis not present

## 2019-07-28 DIAGNOSIS — L03115 Cellulitis of right lower limb: Secondary | ICD-10-CM | POA: Diagnosis not present

## 2019-07-28 DIAGNOSIS — J449 Chronic obstructive pulmonary disease, unspecified: Secondary | ICD-10-CM | POA: Diagnosis not present

## 2019-07-28 LAB — POCT INR: INR: 2.7 (ref 2.0–3.0)

## 2019-07-29 ENCOUNTER — Ambulatory Visit (INDEPENDENT_AMBULATORY_CARE_PROVIDER_SITE_OTHER): Payer: Medicare HMO | Admitting: General Practice

## 2019-07-29 DIAGNOSIS — D6859 Other primary thrombophilia: Secondary | ICD-10-CM

## 2019-07-29 DIAGNOSIS — Z7901 Long term (current) use of anticoagulants: Secondary | ICD-10-CM

## 2019-07-29 NOTE — Patient Instructions (Signed)
Pre visit review using our clinic review tool, if applicable. No additional management support is needed unless otherwise documented below in the visit note.  Continue to take 1/2 tablet daily except 1 tablet on Monday and Fridays.  Dosing instructions given to Olivia Mackie, RN @ [facility] while in the patient's home.  Re-check in 2 weeks.  [phone]

## 2019-07-29 NOTE — Progress Notes (Signed)
I have reviewed and agree with note, evaluation, plan.   Serinity Ware, MD  

## 2019-07-31 ENCOUNTER — Telehealth: Payer: Self-pay

## 2019-07-31 ENCOUNTER — Telehealth: Payer: Self-pay | Admitting: Family Medicine

## 2019-07-31 DIAGNOSIS — N183 Chronic kidney disease, stage 3 unspecified: Secondary | ICD-10-CM | POA: Insufficient documentation

## 2019-07-31 DIAGNOSIS — Z0279 Encounter for issue of other medical certificate: Secondary | ICD-10-CM

## 2019-07-31 DIAGNOSIS — N1832 Chronic kidney disease, stage 3b: Secondary | ICD-10-CM

## 2019-07-31 MED ORDER — FLUOXETINE HCL 40 MG PO CAPS: 40.0000 mg | ORAL_CAPSULE | Freq: Every day | ORAL | 1 refills | Status: AC

## 2019-07-31 MED ORDER — POTASSIUM CHLORIDE CRYS ER 20 MEQ PO TBCR: EXTENDED_RELEASE_TABLET | ORAL | 1 refills | Status: DC

## 2019-07-31 NOTE — Telephone Encounter (Signed)
I will route ref to [facility]  Also please tell her to hold her metformin until further notice- it is hard on kidney function  Thanks

## 2019-07-31 NOTE — Telephone Encounter (Signed)
Latest labs (in IN box) indicate kidney function is not improving as expected   (after she had a uti) Also anemia is a bit worse  How is she feeling?   I want to set her up with a kidney specialist

## 2019-07-31 NOTE — Telephone Encounter (Signed)
[facility] pharmacy left v/m requesting refills for Klorcon 20 meq, Fluoxetine 40 mg cap and diclofenac 75 mg.

## 2019-07-31 NOTE — Telephone Encounter (Signed)
Nephrology Referral faxed to [place], left message for patient to call me back .

## 2019-07-31 NOTE — Telephone Encounter (Signed)
Pt notified of lab results and Dr. Marliss Coots comments. Pt said she feels okay she hasn't had any "major" bleeding but the wounds on the back of her legs occasionally do bleed otherwise she is feeling fine. Pt agreed with referral to Kidney specialist, I advise pt our [facility] will call pt to discuss referral

## 2019-07-31 NOTE — Telephone Encounter (Signed)
I sent the klor con and fluoxetine Cannot refill oral diclofenac due to anemia/h/o bleeding and also because of her renal function  At this point is is not safe at all to take

## 2019-07-31 NOTE — Telephone Encounter (Signed)
Left VM letting pt know Dr. Marliss Coots instructions on stopping the metformin

## 2019-08-03 ENCOUNTER — Encounter: Payer: Self-pay | Admitting: Family Medicine

## 2019-08-04 ENCOUNTER — Other Ambulatory Visit: Payer: Self-pay | Admitting: Family Medicine

## 2019-08-04 NOTE — Telephone Encounter (Signed)
Left VM requesting pt to call the office back 

## 2019-08-04 NOTE — Telephone Encounter (Signed)
Not on med list and it says med was d/c back in July when she left the hospital. Please advise

## 2019-08-04 NOTE — Telephone Encounter (Signed)
I think this is the same as her cartia xt (?)  Is she still taking it?  Please ask her

## 2019-08-05 ENCOUNTER — Telehealth: Payer: Self-pay | Admitting: Family Medicine

## 2019-08-05 NOTE — Telephone Encounter (Signed)
Patient is returning a call. I was not sure if this is about her medication note on 10/30 or a recent referral from the 10/30.   I did not see a note to be sure which she was calling for

## 2019-08-05 NOTE — Telephone Encounter (Signed)
Received message on machine from patient because she must have received my letter about the Nephrology Referral. She said she will go to see the Kidney specialist but she cant go right now. He husband is not doing well and has taken a turn for the worst and is not eating at all.

## 2019-08-05 NOTE — Telephone Encounter (Signed)
Understood- I can either refer out a few mo or she can call us when ready for a referral  -it will take a while to get in Thanks

## 2019-08-06 DIAGNOSIS — E119 Type 2 diabetes mellitus without complications: Secondary | ICD-10-CM | POA: Diagnosis not present

## 2019-08-06 DIAGNOSIS — L03115 Cellulitis of right lower limb: Secondary | ICD-10-CM | POA: Diagnosis not present

## 2019-08-06 DIAGNOSIS — I4891 Unspecified atrial fibrillation: Secondary | ICD-10-CM | POA: Diagnosis not present

## 2019-08-06 DIAGNOSIS — J449 Chronic obstructive pulmonary disease, unspecified: Secondary | ICD-10-CM | POA: Diagnosis not present

## 2019-08-06 DIAGNOSIS — J9621 Acute and chronic respiratory failure with hypoxia: Secondary | ICD-10-CM | POA: Diagnosis not present

## 2019-08-06 DIAGNOSIS — D509 Iron deficiency anemia, unspecified: Secondary | ICD-10-CM | POA: Diagnosis not present

## 2019-08-06 DIAGNOSIS — I5033 Acute on chronic diastolic (congestive) heart failure: Secondary | ICD-10-CM | POA: Diagnosis not present

## 2019-08-06 DIAGNOSIS — I279 Pulmonary heart disease, unspecified: Secondary | ICD-10-CM | POA: Diagnosis not present

## 2019-08-06 NOTE — Telephone Encounter (Signed)
That is what I px the tramadol for (pain) Is she due for a refill on that ?

## 2019-08-06 NOTE — Telephone Encounter (Signed)
Pt called regarding another med and it was addressed

## 2019-08-06 NOTE — Telephone Encounter (Signed)
tiadylt is the diltiazem and she said that's the brand her insurance pays for so she does want [place] to refill it if possible.   Also pt wasn't aware that she wasn't suppose to be on the diclofenac. She said when the hospital d/c the med in July they didn't tell her so she's been taking med ever since. Pt is out of med and is in pain and is asking Dr. Glori Bickers what can she prescribe that is safe for her to take for her "arthritis pain" pt said she needs something because she's been taking the diclofenac for over 20 yrs and it's always helped. Pt said if Dr. Glori Bickers can send in pain med to [facility] and [facility].

## 2019-08-06 NOTE — Telephone Encounter (Signed)
Pt notified of Dr. Marliss Coots comments. Pt verbalized understanding. She has tramadol and doesn't need refill

## 2019-08-07 ENCOUNTER — Other Ambulatory Visit: Payer: Self-pay | Admitting: *Deleted

## 2019-08-07 MED ORDER — BUDESONIDE-FORMOTEROL FUMARATE 160-4.5 MCG/ACT IN AERO: INHALATION_SPRAY | RESPIRATORY_TRACT | 1 refills | Status: DC

## 2019-08-07 NOTE — Telephone Encounter (Signed)
Pt wanted me to make sure we are aware that her iron, tiadylet and symbicort go to [facility] [address] and everything else goes through Mail order pharmacy

## 2019-08-11 ENCOUNTER — Ambulatory Visit (INDEPENDENT_AMBULATORY_CARE_PROVIDER_SITE_OTHER): Payer: Medicare HMO | Admitting: General Practice

## 2019-08-11 ENCOUNTER — Telehealth: Payer: Self-pay | Admitting: *Deleted

## 2019-08-11 DIAGNOSIS — D509 Iron deficiency anemia, unspecified: Secondary | ICD-10-CM | POA: Diagnosis not present

## 2019-08-11 DIAGNOSIS — J449 Chronic obstructive pulmonary disease, unspecified: Secondary | ICD-10-CM | POA: Diagnosis not present

## 2019-08-11 DIAGNOSIS — D6859 Other primary thrombophilia: Secondary | ICD-10-CM

## 2019-08-11 DIAGNOSIS — L03115 Cellulitis of right lower limb: Secondary | ICD-10-CM | POA: Diagnosis not present

## 2019-08-11 DIAGNOSIS — J9621 Acute and chronic respiratory failure with hypoxia: Secondary | ICD-10-CM | POA: Diagnosis not present

## 2019-08-11 DIAGNOSIS — I279 Pulmonary heart disease, unspecified: Secondary | ICD-10-CM | POA: Diagnosis not present

## 2019-08-11 DIAGNOSIS — Z7901 Long term (current) use of anticoagulants: Secondary | ICD-10-CM

## 2019-08-11 DIAGNOSIS — I5033 Acute on chronic diastolic (congestive) heart failure: Secondary | ICD-10-CM | POA: Diagnosis not present

## 2019-08-11 DIAGNOSIS — I4891 Unspecified atrial fibrillation: Secondary | ICD-10-CM | POA: Diagnosis not present

## 2019-08-11 DIAGNOSIS — E119 Type 2 diabetes mellitus without complications: Secondary | ICD-10-CM | POA: Diagnosis not present

## 2019-08-11 LAB — POCT INR: INR: 1.4 — AB (ref 2.0–3.0)

## 2019-08-11 NOTE — Progress Notes (Signed)
Medical screening examination/treatment/procedure(s) were performed by non-physician practitioner and as supervising physician I was immediately available for consultation/collaboration. I agree with above. Ernst Cumpston, MD   

## 2019-08-11 NOTE — Patient Instructions (Signed)
Pre visit review using our clinic review tool, if applicable. No additional management support is needed unless otherwise documented below in the visit note. 

## 2019-08-11 NOTE — Telephone Encounter (Signed)
Kat left a voicemail stating that she is calling to report a critical lab on patient.  INR today was reported to be 1.4. Message was left to contact the patient.

## 2019-08-11 NOTE — Telephone Encounter (Signed)
In review of the result today, I can see that [place] Herb, RN has addressed and dosed the patient.    Please refer to her coag encounter for today.   Thanks!

## 2019-08-13 DIAGNOSIS — M6281 Muscle weakness (generalized): Secondary | ICD-10-CM | POA: Diagnosis not present

## 2019-08-13 DIAGNOSIS — J9621 Acute and chronic respiratory failure with hypoxia: Secondary | ICD-10-CM | POA: Diagnosis not present

## 2019-08-13 DIAGNOSIS — R2689 Other abnormalities of gait and mobility: Secondary | ICD-10-CM | POA: Diagnosis not present

## 2019-08-18 ENCOUNTER — Other Ambulatory Visit: Payer: Self-pay | Admitting: Family Medicine

## 2019-08-18 ENCOUNTER — Telehealth: Payer: Self-pay | Admitting: Family Medicine

## 2019-08-18 NOTE — Telephone Encounter (Signed)
Nikki With [facility] is requesting a call back to discuss the Patient's INR. She requesting to speak with [name] in regards to this    Phone- [phone]

## 2019-08-19 DIAGNOSIS — G4733 Obstructive sleep apnea (adult) (pediatric): Secondary | ICD-10-CM | POA: Diagnosis not present

## 2019-08-20 ENCOUNTER — Ambulatory Visit (INDEPENDENT_AMBULATORY_CARE_PROVIDER_SITE_OTHER): Payer: Medicare HMO

## 2019-08-20 DIAGNOSIS — E119 Type 2 diabetes mellitus without complications: Secondary | ICD-10-CM | POA: Diagnosis not present

## 2019-08-20 DIAGNOSIS — D509 Iron deficiency anemia, unspecified: Secondary | ICD-10-CM | POA: Diagnosis not present

## 2019-08-20 DIAGNOSIS — J449 Chronic obstructive pulmonary disease, unspecified: Secondary | ICD-10-CM | POA: Diagnosis not present

## 2019-08-20 DIAGNOSIS — Z7901 Long term (current) use of anticoagulants: Secondary | ICD-10-CM | POA: Diagnosis not present

## 2019-08-20 DIAGNOSIS — I279 Pulmonary heart disease, unspecified: Secondary | ICD-10-CM | POA: Diagnosis not present

## 2019-08-20 DIAGNOSIS — L03115 Cellulitis of right lower limb: Secondary | ICD-10-CM | POA: Diagnosis not present

## 2019-08-20 DIAGNOSIS — I4891 Unspecified atrial fibrillation: Secondary | ICD-10-CM | POA: Diagnosis not present

## 2019-08-20 DIAGNOSIS — I5033 Acute on chronic diastolic (congestive) heart failure: Secondary | ICD-10-CM | POA: Diagnosis not present

## 2019-08-20 DIAGNOSIS — D6859 Other primary thrombophilia: Secondary | ICD-10-CM

## 2019-08-20 DIAGNOSIS — J9621 Acute and chronic respiratory failure with hypoxia: Secondary | ICD-10-CM | POA: Diagnosis not present

## 2019-08-20 LAB — POCT INR: INR: 1.8 — AB (ref 2.0–3.0)

## 2019-08-20 NOTE — Telephone Encounter (Signed)
Noted, patient contacted.  Amy Bryant, please note that [name] assists me with coumadin on Thursdays and can be available for INR management at that time if need be.   Dr. Glori Bickers,   Patient wanted me to be sure that you knew that her husband, [name], passed away last week.  She wasn't sure if [facility] made you aware.  I extended our sincerest condolences.

## 2019-08-20 NOTE — Patient Instructions (Signed)
INR: 1.8  Take 1 tablet today 11/19 and then increase weekly dose to taking 1/2 tablet daily except 1 tablet on Monday, Wednesdays and  Fridays.  Dosing instructions given to Kathlee Nations, RN @ [facility] while in the patient's home.  Re-check in 2 weeks.  [phone].  Also notified patient.

## 2019-08-20 NOTE — Telephone Encounter (Signed)
Routing to [employer]

## 2019-08-20 NOTE — Telephone Encounter (Signed)
Kathlee Nations with [facility] called to give a report on the patient's INR. She stated it was 1.8   Kathlee Nations would like to get verbal orders on when she should check it again  She stated if she does not answer call back that it is okay to leave on her voicemail   Phone- [phone]

## 2019-08-21 DIAGNOSIS — G4733 Obstructive sleep apnea (adult) (pediatric): Secondary | ICD-10-CM | POA: Diagnosis not present

## 2019-08-25 DIAGNOSIS — E119 Type 2 diabetes mellitus without complications: Secondary | ICD-10-CM | POA: Diagnosis not present

## 2019-08-25 DIAGNOSIS — I5033 Acute on chronic diastolic (congestive) heart failure: Secondary | ICD-10-CM | POA: Diagnosis not present

## 2019-08-25 DIAGNOSIS — I4891 Unspecified atrial fibrillation: Secondary | ICD-10-CM | POA: Diagnosis not present

## 2019-08-25 DIAGNOSIS — J449 Chronic obstructive pulmonary disease, unspecified: Secondary | ICD-10-CM | POA: Diagnosis not present

## 2019-08-25 DIAGNOSIS — D509 Iron deficiency anemia, unspecified: Secondary | ICD-10-CM | POA: Diagnosis not present

## 2019-08-25 DIAGNOSIS — I279 Pulmonary heart disease, unspecified: Secondary | ICD-10-CM | POA: Diagnosis not present

## 2019-08-25 DIAGNOSIS — L03115 Cellulitis of right lower limb: Secondary | ICD-10-CM | POA: Diagnosis not present

## 2019-08-25 DIAGNOSIS — J9621 Acute and chronic respiratory failure with hypoxia: Secondary | ICD-10-CM | POA: Diagnosis not present

## 2019-09-02 DIAGNOSIS — R2681 Unsteadiness on feet: Secondary | ICD-10-CM

## 2019-09-02 DIAGNOSIS — Z6841 Body Mass Index (BMI) 40.0 and over, adult: Secondary | ICD-10-CM

## 2019-09-02 DIAGNOSIS — Z87891 Personal history of nicotine dependence: Secondary | ICD-10-CM

## 2019-09-02 DIAGNOSIS — Z7984 Long term (current) use of oral hypoglycemic drugs: Secondary | ICD-10-CM

## 2019-09-02 DIAGNOSIS — J449 Chronic obstructive pulmonary disease, unspecified: Secondary | ICD-10-CM

## 2019-09-02 DIAGNOSIS — I4891 Unspecified atrial fibrillation: Secondary | ICD-10-CM

## 2019-09-02 DIAGNOSIS — L03115 Cellulitis of right lower limb: Secondary | ICD-10-CM | POA: Diagnosis not present

## 2019-09-02 DIAGNOSIS — Z9981 Dependence on supplemental oxygen: Secondary | ICD-10-CM

## 2019-09-02 DIAGNOSIS — Z7901 Long term (current) use of anticoagulants: Secondary | ICD-10-CM

## 2019-09-02 DIAGNOSIS — M6281 Muscle weakness (generalized): Secondary | ICD-10-CM

## 2019-09-02 DIAGNOSIS — J9621 Acute and chronic respiratory failure with hypoxia: Secondary | ICD-10-CM | POA: Diagnosis not present

## 2019-09-02 DIAGNOSIS — D509 Iron deficiency anemia, unspecified: Secondary | ICD-10-CM

## 2019-09-02 DIAGNOSIS — I279 Pulmonary heart disease, unspecified: Secondary | ICD-10-CM | POA: Diagnosis not present

## 2019-09-02 DIAGNOSIS — I5033 Acute on chronic diastolic (congestive) heart failure: Secondary | ICD-10-CM | POA: Diagnosis not present

## 2019-09-02 DIAGNOSIS — E119 Type 2 diabetes mellitus without complications: Secondary | ICD-10-CM

## 2019-09-03 ENCOUNTER — Ambulatory Visit (INDEPENDENT_AMBULATORY_CARE_PROVIDER_SITE_OTHER): Payer: Medicare HMO | Admitting: General Practice

## 2019-09-03 DIAGNOSIS — I5033 Acute on chronic diastolic (congestive) heart failure: Secondary | ICD-10-CM | POA: Diagnosis not present

## 2019-09-03 DIAGNOSIS — E119 Type 2 diabetes mellitus without complications: Secondary | ICD-10-CM | POA: Diagnosis not present

## 2019-09-03 DIAGNOSIS — D509 Iron deficiency anemia, unspecified: Secondary | ICD-10-CM | POA: Diagnosis not present

## 2019-09-03 DIAGNOSIS — D6859 Other primary thrombophilia: Secondary | ICD-10-CM

## 2019-09-03 DIAGNOSIS — I279 Pulmonary heart disease, unspecified: Secondary | ICD-10-CM | POA: Diagnosis not present

## 2019-09-03 DIAGNOSIS — J449 Chronic obstructive pulmonary disease, unspecified: Secondary | ICD-10-CM | POA: Diagnosis not present

## 2019-09-03 DIAGNOSIS — I4891 Unspecified atrial fibrillation: Secondary | ICD-10-CM | POA: Diagnosis not present

## 2019-09-03 DIAGNOSIS — Z7901 Long term (current) use of anticoagulants: Secondary | ICD-10-CM | POA: Diagnosis not present

## 2019-09-03 DIAGNOSIS — L03115 Cellulitis of right lower limb: Secondary | ICD-10-CM | POA: Diagnosis not present

## 2019-09-03 DIAGNOSIS — J9621 Acute and chronic respiratory failure with hypoxia: Secondary | ICD-10-CM | POA: Diagnosis not present

## 2019-09-03 LAB — POCT INR: INR: 2.1 (ref 2.0–3.0)

## 2019-09-03 NOTE — Patient Instructions (Signed)
Pre visit review using our clinic review tool, if applicable. No additional management support is needed unless otherwise documented below in the visit note.  Continue to take 1/2 tablet daily except 1 tablet on Monday, Wednesdays and  Fridays.  Dosing instructions given to [place], RN @ [facility] while in the patient's home.  Re-check in 2 weeks.

## 2019-09-08 DIAGNOSIS — D509 Iron deficiency anemia, unspecified: Secondary | ICD-10-CM | POA: Diagnosis not present

## 2019-09-08 DIAGNOSIS — I4891 Unspecified atrial fibrillation: Secondary | ICD-10-CM | POA: Diagnosis not present

## 2019-09-08 DIAGNOSIS — I279 Pulmonary heart disease, unspecified: Secondary | ICD-10-CM | POA: Diagnosis not present

## 2019-09-08 DIAGNOSIS — L03115 Cellulitis of right lower limb: Secondary | ICD-10-CM | POA: Diagnosis not present

## 2019-09-08 DIAGNOSIS — J449 Chronic obstructive pulmonary disease, unspecified: Secondary | ICD-10-CM | POA: Diagnosis not present

## 2019-09-08 DIAGNOSIS — J9621 Acute and chronic respiratory failure with hypoxia: Secondary | ICD-10-CM | POA: Diagnosis not present

## 2019-09-08 DIAGNOSIS — E119 Type 2 diabetes mellitus without complications: Secondary | ICD-10-CM | POA: Diagnosis not present

## 2019-09-08 DIAGNOSIS — I5033 Acute on chronic diastolic (congestive) heart failure: Secondary | ICD-10-CM | POA: Diagnosis not present

## 2019-09-12 DIAGNOSIS — R2689 Other abnormalities of gait and mobility: Secondary | ICD-10-CM | POA: Diagnosis not present

## 2019-09-12 DIAGNOSIS — M6281 Muscle weakness (generalized): Secondary | ICD-10-CM | POA: Diagnosis not present

## 2019-09-12 DIAGNOSIS — J9621 Acute and chronic respiratory failure with hypoxia: Secondary | ICD-10-CM | POA: Diagnosis not present

## 2019-09-17 ENCOUNTER — Ambulatory Visit (INDEPENDENT_AMBULATORY_CARE_PROVIDER_SITE_OTHER): Payer: Medicare HMO | Admitting: General Practice

## 2019-09-17 DIAGNOSIS — E119 Type 2 diabetes mellitus without complications: Secondary | ICD-10-CM | POA: Diagnosis not present

## 2019-09-17 DIAGNOSIS — J449 Chronic obstructive pulmonary disease, unspecified: Secondary | ICD-10-CM | POA: Diagnosis not present

## 2019-09-17 DIAGNOSIS — D509 Iron deficiency anemia, unspecified: Secondary | ICD-10-CM | POA: Diagnosis not present

## 2019-09-17 DIAGNOSIS — D6859 Other primary thrombophilia: Secondary | ICD-10-CM

## 2019-09-17 DIAGNOSIS — I279 Pulmonary heart disease, unspecified: Secondary | ICD-10-CM | POA: Diagnosis not present

## 2019-09-17 DIAGNOSIS — L03115 Cellulitis of right lower limb: Secondary | ICD-10-CM | POA: Diagnosis not present

## 2019-09-17 DIAGNOSIS — I5033 Acute on chronic diastolic (congestive) heart failure: Secondary | ICD-10-CM | POA: Diagnosis not present

## 2019-09-17 DIAGNOSIS — Z7901 Long term (current) use of anticoagulants: Secondary | ICD-10-CM

## 2019-09-17 DIAGNOSIS — I480 Paroxysmal atrial fibrillation: Secondary | ICD-10-CM | POA: Diagnosis not present

## 2019-09-17 DIAGNOSIS — I4891 Unspecified atrial fibrillation: Secondary | ICD-10-CM | POA: Diagnosis not present

## 2019-09-17 DIAGNOSIS — J9621 Acute and chronic respiratory failure with hypoxia: Secondary | ICD-10-CM | POA: Diagnosis not present

## 2019-09-17 LAB — POCT INR: INR: 2 (ref 2.0–3.0)

## 2019-09-17 NOTE — Patient Instructions (Signed)
Pre visit review using our clinic review tool, if applicable. No additional management support is needed unless otherwise documented below in the visit note.  Continue to take 1/2 tablet daily except 1 tablet on Monday, Wednesdays and  Fridays.  Dosing instructions given to Olivia Mackie, RN @ [facility] [phone] while in the patient's home.  Re-check in 2 weeks.

## 2019-09-18 DIAGNOSIS — G4733 Obstructive sleep apnea (adult) (pediatric): Secondary | ICD-10-CM | POA: Diagnosis not present

## 2019-09-21 DIAGNOSIS — G4733 Obstructive sleep apnea (adult) (pediatric): Secondary | ICD-10-CM | POA: Diagnosis not present

## 2019-09-23 DIAGNOSIS — E119 Type 2 diabetes mellitus without complications: Secondary | ICD-10-CM | POA: Diagnosis not present

## 2019-09-23 DIAGNOSIS — J449 Chronic obstructive pulmonary disease, unspecified: Secondary | ICD-10-CM | POA: Diagnosis not present

## 2019-09-23 DIAGNOSIS — L03115 Cellulitis of right lower limb: Secondary | ICD-10-CM | POA: Diagnosis not present

## 2019-09-23 DIAGNOSIS — I5033 Acute on chronic diastolic (congestive) heart failure: Secondary | ICD-10-CM | POA: Diagnosis not present

## 2019-09-23 DIAGNOSIS — I4891 Unspecified atrial fibrillation: Secondary | ICD-10-CM | POA: Diagnosis not present

## 2019-09-23 DIAGNOSIS — J9621 Acute and chronic respiratory failure with hypoxia: Secondary | ICD-10-CM | POA: Diagnosis not present

## 2019-09-23 DIAGNOSIS — I279 Pulmonary heart disease, unspecified: Secondary | ICD-10-CM | POA: Diagnosis not present

## 2019-09-23 DIAGNOSIS — D509 Iron deficiency anemia, unspecified: Secondary | ICD-10-CM | POA: Diagnosis not present

## 2019-09-28 ENCOUNTER — Ambulatory Visit (INDEPENDENT_AMBULATORY_CARE_PROVIDER_SITE_OTHER): Payer: Medicare HMO | Admitting: General Practice

## 2019-09-28 DIAGNOSIS — E119 Type 2 diabetes mellitus without complications: Secondary | ICD-10-CM | POA: Diagnosis not present

## 2019-09-28 DIAGNOSIS — Z7901 Long term (current) use of anticoagulants: Secondary | ICD-10-CM | POA: Diagnosis not present

## 2019-09-28 DIAGNOSIS — L03115 Cellulitis of right lower limb: Secondary | ICD-10-CM | POA: Diagnosis not present

## 2019-09-28 DIAGNOSIS — J9621 Acute and chronic respiratory failure with hypoxia: Secondary | ICD-10-CM | POA: Diagnosis not present

## 2019-09-28 DIAGNOSIS — J449 Chronic obstructive pulmonary disease, unspecified: Secondary | ICD-10-CM | POA: Diagnosis not present

## 2019-09-28 DIAGNOSIS — I4891 Unspecified atrial fibrillation: Secondary | ICD-10-CM | POA: Diagnosis not present

## 2019-09-28 DIAGNOSIS — I279 Pulmonary heart disease, unspecified: Secondary | ICD-10-CM | POA: Diagnosis not present

## 2019-09-28 DIAGNOSIS — D509 Iron deficiency anemia, unspecified: Secondary | ICD-10-CM | POA: Diagnosis not present

## 2019-09-28 DIAGNOSIS — I5033 Acute on chronic diastolic (congestive) heart failure: Secondary | ICD-10-CM | POA: Diagnosis not present

## 2019-09-28 LAB — POCT INR: INR: 2.9 (ref 2.0–3.0)

## 2019-09-28 NOTE — Patient Instructions (Signed)
Pre visit review using our clinic review tool, if applicable. No additional management support is needed unless otherwise documented below in the visit note.  Continue to take 1/2 tablet daily except 1 tablet on Monday, Wednesdays and  Fridays.  Dosing instructions given to Tracy, RN @ [facility] [phone] while in the patient's home.  Re-check in 2 weeks.   

## 2019-10-02 DIAGNOSIS — L0390 Cellulitis, unspecified: Secondary | ICD-10-CM

## 2019-10-02 HISTORY — DX: Cellulitis, unspecified: L03.90

## 2019-10-06 ENCOUNTER — Inpatient Hospital Stay (HOSPITAL_COMMUNITY)
Admission: EM | Admit: 2019-10-06 | Discharge: 2019-10-09 | DRG: 603 | Disposition: A | Discharge status: Home / Self Care | Payer: Medicare HMO | Attending: Internal Medicine | Admitting: Internal Medicine

## 2019-10-06 ENCOUNTER — Other Ambulatory Visit: Payer: Self-pay

## 2019-10-06 ENCOUNTER — Emergency Department (HOSPITAL_COMMUNITY): Payer: Medicare HMO

## 2019-10-06 DIAGNOSIS — Z833 Family history of diabetes mellitus: Secondary | ICD-10-CM | POA: Diagnosis not present

## 2019-10-06 DIAGNOSIS — Z87891 Personal history of nicotine dependence: Secondary | ICD-10-CM | POA: Diagnosis not present

## 2019-10-06 DIAGNOSIS — E162 Hypoglycemia, unspecified: Secondary | ICD-10-CM | POA: Diagnosis not present

## 2019-10-06 DIAGNOSIS — E213 Hyperparathyroidism, unspecified: Secondary | ICD-10-CM | POA: Diagnosis present

## 2019-10-06 DIAGNOSIS — N1832 Chronic kidney disease, stage 3b: Secondary | ICD-10-CM | POA: Diagnosis present

## 2019-10-06 DIAGNOSIS — E11622 Type 2 diabetes mellitus with other skin ulcer: Secondary | ICD-10-CM | POA: Diagnosis present

## 2019-10-06 DIAGNOSIS — I130 Hypertensive heart and chronic kidney disease with heart failure and stage 1 through stage 4 chronic kidney disease, or unspecified chronic kidney disease: Secondary | ICD-10-CM | POA: Diagnosis not present

## 2019-10-06 DIAGNOSIS — E119 Type 2 diabetes mellitus without complications: Secondary | ICD-10-CM

## 2019-10-06 DIAGNOSIS — E876 Hypokalemia: Secondary | ICD-10-CM | POA: Diagnosis present

## 2019-10-06 DIAGNOSIS — N183 Chronic kidney disease, stage 3 unspecified: Secondary | ICD-10-CM | POA: Diagnosis present

## 2019-10-06 DIAGNOSIS — D631 Anemia in chronic kidney disease: Secondary | ICD-10-CM | POA: Diagnosis present

## 2019-10-06 DIAGNOSIS — I890 Lymphedema, not elsewhere classified: Secondary | ICD-10-CM | POA: Diagnosis present

## 2019-10-06 DIAGNOSIS — M7989 Other specified soft tissue disorders: Secondary | ICD-10-CM | POA: Diagnosis not present

## 2019-10-06 DIAGNOSIS — E039 Hypothyroidism, unspecified: Secondary | ICD-10-CM | POA: Diagnosis present

## 2019-10-06 DIAGNOSIS — L03115 Cellulitis of right lower limb: Secondary | ICD-10-CM | POA: Diagnosis not present

## 2019-10-06 DIAGNOSIS — I4891 Unspecified atrial fibrillation: Secondary | ICD-10-CM | POA: Diagnosis present

## 2019-10-06 DIAGNOSIS — Z86711 Personal history of pulmonary embolism: Secondary | ICD-10-CM | POA: Diagnosis not present

## 2019-10-06 DIAGNOSIS — Z79890 Hormone replacement therapy (postmenopausal): Secondary | ICD-10-CM | POA: Diagnosis not present

## 2019-10-06 DIAGNOSIS — E161 Other hypoglycemia: Secondary | ICD-10-CM | POA: Diagnosis not present

## 2019-10-06 DIAGNOSIS — I5042 Chronic combined systolic (congestive) and diastolic (congestive) heart failure: Secondary | ICD-10-CM | POA: Diagnosis not present

## 2019-10-06 DIAGNOSIS — E1165 Type 2 diabetes mellitus with hyperglycemia: Secondary | ICD-10-CM

## 2019-10-06 DIAGNOSIS — Z6841 Body Mass Index (BMI) 40.0 and over, adult: Secondary | ICD-10-CM

## 2019-10-06 DIAGNOSIS — Z20822 Contact with and (suspected) exposure to covid-19: Secondary | ICD-10-CM | POA: Diagnosis not present

## 2019-10-06 DIAGNOSIS — R609 Edema, unspecified: Secondary | ICD-10-CM | POA: Diagnosis not present

## 2019-10-06 DIAGNOSIS — E1121 Type 2 diabetes mellitus with diabetic nephropathy: Secondary | ICD-10-CM | POA: Diagnosis not present

## 2019-10-06 DIAGNOSIS — D509 Iron deficiency anemia, unspecified: Secondary | ICD-10-CM | POA: Diagnosis not present

## 2019-10-06 DIAGNOSIS — Z7901 Long term (current) use of anticoagulants: Secondary | ICD-10-CM

## 2019-10-06 DIAGNOSIS — L97919 Non-pressure chronic ulcer of unspecified part of right lower leg with unspecified severity: Secondary | ICD-10-CM | POA: Diagnosis present

## 2019-10-06 DIAGNOSIS — J9621 Acute and chronic respiratory failure with hypoxia: Secondary | ICD-10-CM | POA: Diagnosis not present

## 2019-10-06 DIAGNOSIS — N179 Acute kidney failure, unspecified: Secondary | ICD-10-CM | POA: Diagnosis not present

## 2019-10-06 DIAGNOSIS — R52 Pain, unspecified: Secondary | ICD-10-CM | POA: Diagnosis not present

## 2019-10-06 DIAGNOSIS — R509 Fever, unspecified: Secondary | ICD-10-CM | POA: Diagnosis not present

## 2019-10-06 DIAGNOSIS — Z7984 Long term (current) use of oral hypoglycemic drugs: Secondary | ICD-10-CM | POA: Diagnosis not present

## 2019-10-06 DIAGNOSIS — Z79899 Other long term (current) drug therapy: Secondary | ICD-10-CM

## 2019-10-06 DIAGNOSIS — Z7951 Long term (current) use of inhaled steroids: Secondary | ICD-10-CM | POA: Diagnosis not present

## 2019-10-06 DIAGNOSIS — R110 Nausea: Secondary | ICD-10-CM | POA: Diagnosis not present

## 2019-10-06 DIAGNOSIS — Z23 Encounter for immunization: Secondary | ICD-10-CM

## 2019-10-06 DIAGNOSIS — K8020 Calculus of gallbladder without cholecystitis without obstruction: Secondary | ICD-10-CM | POA: Diagnosis not present

## 2019-10-06 DIAGNOSIS — I5033 Acute on chronic diastolic (congestive) heart failure: Secondary | ICD-10-CM | POA: Diagnosis not present

## 2019-10-06 DIAGNOSIS — L89212 Pressure ulcer of right hip, stage 2: Secondary | ICD-10-CM | POA: Diagnosis present

## 2019-10-06 DIAGNOSIS — J449 Chronic obstructive pulmonary disease, unspecified: Secondary | ICD-10-CM | POA: Diagnosis present

## 2019-10-06 DIAGNOSIS — D72829 Elevated white blood cell count, unspecified: Secondary | ICD-10-CM

## 2019-10-06 DIAGNOSIS — E662 Morbid (severe) obesity with alveolar hypoventilation: Secondary | ICD-10-CM | POA: Diagnosis not present

## 2019-10-06 DIAGNOSIS — E11649 Type 2 diabetes mellitus with hypoglycemia without coma: Secondary | ICD-10-CM | POA: Diagnosis present

## 2019-10-06 DIAGNOSIS — R0602 Shortness of breath: Secondary | ICD-10-CM | POA: Diagnosis not present

## 2019-10-06 DIAGNOSIS — E871 Hypo-osmolality and hyponatremia: Secondary | ICD-10-CM | POA: Diagnosis present

## 2019-10-06 DIAGNOSIS — N200 Calculus of kidney: Secondary | ICD-10-CM | POA: Diagnosis not present

## 2019-10-06 DIAGNOSIS — I279 Pulmonary heart disease, unspecified: Secondary | ICD-10-CM | POA: Diagnosis not present

## 2019-10-06 DIAGNOSIS — K219 Gastro-esophageal reflux disease without esophagitis: Secondary | ICD-10-CM | POA: Diagnosis present

## 2019-10-06 DIAGNOSIS — E1122 Type 2 diabetes mellitus with diabetic chronic kidney disease: Secondary | ICD-10-CM | POA: Diagnosis present

## 2019-10-06 LAB — CBG MONITORING, ED
Glucose-Capillary: 46 mg/dL — ABNORMAL LOW (ref 70–99)
Glucose-Capillary: 47 mg/dL — ABNORMAL LOW (ref 70–99)
Glucose-Capillary: 48 mg/dL — ABNORMAL LOW (ref 70–99)
Glucose-Capillary: 50 mg/dL — ABNORMAL LOW (ref 70–99)
Glucose-Capillary: 51 mg/dL — ABNORMAL LOW (ref 70–99)
Glucose-Capillary: 61 mg/dL — ABNORMAL LOW (ref 70–99)
Glucose-Capillary: 69 mg/dL — ABNORMAL LOW (ref 70–99)
Glucose-Capillary: 73 mg/dL (ref 70–99)

## 2019-10-06 LAB — CBC
HCT: 29.2 % — ABNORMAL LOW (ref 36.0–46.0)
Hemoglobin: 9.3 g/dL — ABNORMAL LOW (ref 12.0–15.0)
MCH: 30.2 pg (ref 26.0–34.0)
MCHC: 31.8 g/dL (ref 30.0–36.0)
MCV: 94.8 fL (ref 80.0–100.0)
Platelets: 172 10*3/uL (ref 150–400)
RBC: 3.08 MIL/uL — ABNORMAL LOW (ref 3.87–5.11)
RDW: 13.2 % (ref 11.5–15.5)
WBC: 15.7 10*3/uL — ABNORMAL HIGH (ref 4.0–10.5)
nRBC: 0 % (ref 0.0–0.2)

## 2019-10-06 LAB — COMPREHENSIVE METABOLIC PANEL
ALT: 17 U/L (ref 0–44)
AST: 38 U/L (ref 15–41)
Albumin: 2.6 g/dL — ABNORMAL LOW (ref 3.5–5.0)
Alkaline Phosphatase: 93 U/L (ref 38–126)
Anion gap: 11 (ref 5–15)
BUN: 57 mg/dL — ABNORMAL HIGH (ref 8–23)
CO2: 23 mmol/L (ref 22–32)
Calcium: 8.8 mg/dL — ABNORMAL LOW (ref 8.9–10.3)
Chloride: 95 mmol/L — ABNORMAL LOW (ref 98–111)
Creatinine, Ser: 2.4 mg/dL — ABNORMAL HIGH (ref 0.44–1.00)
GFR calc Af Amer: 24 mL/min — ABNORMAL LOW (ref >60)
GFR calc non Af Amer: 21 mL/min — ABNORMAL LOW (ref >60)
Glucose, Bld: 44 mg/dL — CL (ref 70–99)
Potassium: 3.2 mmol/L — ABNORMAL LOW (ref 3.5–5.1)
Sodium: 129 mmol/L — ABNORMAL LOW (ref 135–145)
Total Bilirubin: 0.7 mg/dL (ref 0.3–1.2)
Total Protein: 6.6 g/dL (ref 6.5–8.1)

## 2019-10-06 LAB — RESPIRATORY PANEL BY RT PCR (FLU A&B, COVID)
Influenza A by PCR: NEGATIVE
Influenza B by PCR: NEGATIVE
SARS Coronavirus 2 by RT PCR: NEGATIVE

## 2019-10-06 LAB — POC SARS CORONAVIRUS 2 AG -  ED: SARS Coronavirus 2 Ag: NEGATIVE

## 2019-10-06 LAB — PROTIME-INR
INR: 2.7 — ABNORMAL HIGH (ref 0.8–1.2)
Prothrombin Time: 28.7 seconds — ABNORMAL HIGH (ref 11.4–15.2)

## 2019-10-06 LAB — BRAIN NATRIURETIC PEPTIDE: B Natriuretic Peptide: 152.6 pg/mL — ABNORMAL HIGH (ref 0.0–100.0)

## 2019-10-06 LAB — LIPASE, BLOOD: Lipase: 30 U/L (ref 11–51)

## 2019-10-06 LAB — LACTIC ACID, PLASMA: Lactic Acid, Venous: 1.1 mmol/L (ref 0.5–1.9)

## 2019-10-06 MED ORDER — ENOXAPARIN SODIUM 40 MG/0.4ML ~~LOC~~ SOLN: 40.0000 mg | SUBCUTANEOUS | Status: DC

## 2019-10-06 MED ORDER — INSULIN ASPART 100 UNIT/ML ~~LOC~~ SOLN: 0.0000 [IU] | Freq: Three times a day (TID) | SUBCUTANEOUS | Status: DC

## 2019-10-06 MED ORDER — VANCOMYCIN HCL 750 MG/150ML IV SOLN
750.0000 mg | INTRAVENOUS | Status: DC
  Administered 2019-10-07: 18:00:00 750 mg via INTRAVENOUS
  Filled 2019-10-06 (×2): qty 150

## 2019-10-06 MED ORDER — ACETAMINOPHEN 325 MG PO TABS
650.0000 mg | ORAL_TABLET | Freq: Four times a day (QID) | ORAL | Status: DC | PRN
  Administered 2019-10-07 – 2019-10-08 (×2): 650 mg via ORAL
  Filled 2019-10-06 (×3): qty 2

## 2019-10-06 MED ORDER — DEXTROSE-NACL 5-0.9 % IV SOLN: INTRAVENOUS | Status: DC

## 2019-10-06 MED ORDER — ACETAMINOPHEN 650 MG RE SUPP: 650.0000 mg | Freq: Four times a day (QID) | RECTAL | Status: DC | PRN

## 2019-10-06 MED ORDER — VANCOMYCIN HCL 10 G IV SOLR
2500.0000 mg | Freq: Once | INTRAVENOUS | Status: AC
  Administered 2019-10-06: 2500 mg via INTRAVENOUS
  Filled 2019-10-06: qty 2500

## 2019-10-06 MED ORDER — PIPERACILLIN-TAZOBACTAM 3.375 G IVPB 30 MIN
3.3750 g | Freq: Once | INTRAVENOUS | Status: AC
  Administered 2019-10-06: 18:00:00 3.375 g via INTRAVENOUS
  Filled 2019-10-06: qty 50

## 2019-10-06 MED ORDER — DEXTROSE 50 % IV SOLN
1.0000 | Freq: Once | INTRAVENOUS | Status: AC
  Administered 2019-10-06: 15:00:00 50 mL via INTRAVENOUS

## 2019-10-06 MED ORDER — ONDANSETRON HCL 4 MG/2ML IJ SOLN
4.0000 mg | Freq: Four times a day (QID) | INTRAMUSCULAR | Status: DC | PRN
  Administered 2019-10-07 (×2): 4 mg via INTRAVENOUS
  Filled 2019-10-06 (×2): qty 2

## 2019-10-06 MED ORDER — ONDANSETRON HCL 4 MG PO TABS
4.0000 mg | ORAL_TABLET | Freq: Four times a day (QID) | ORAL | Status: DC | PRN
  Filled 2019-10-06: qty 1

## 2019-10-06 MED ORDER — LACTATED RINGERS IV BOLUS
1000.0000 mL | Freq: Once | INTRAVENOUS | Status: AC
  Administered 2019-10-06: 16:00:00 1000 mL via INTRAVENOUS

## 2019-10-06 MED ORDER — SODIUM CHLORIDE 0.9 % IV SOLN
2.0000 g | INTRAVENOUS | Status: DC
  Administered 2019-10-06 – 2019-10-07 (×2): 2 g via INTRAVENOUS
  Filled 2019-10-06 (×2): qty 2

## 2019-10-06 MED ORDER — DEXTROSE 50 % IV SOLN
INTRAVENOUS | Status: AC
  Filled 2019-10-06: qty 50

## 2019-10-06 MED ORDER — INSULIN ASPART 100 UNIT/ML ~~LOC~~ SOLN: 0.0000 [IU] | Freq: Every day | SUBCUTANEOUS | Status: DC

## 2019-10-06 MED ORDER — SODIUM CHLORIDE 0.9 % IV SOLN: INTRAVENOUS | Status: DC

## 2019-10-06 MED ORDER — HYDROMORPHONE HCL 1 MG/ML IJ SOLN
1.0000 mg | Freq: Once | INTRAMUSCULAR | Status: AC
  Administered 2019-10-06: 1 mg via INTRAVENOUS
  Filled 2019-10-06: qty 1

## 2019-10-06 MED ORDER — SODIUM CHLORIDE 0.9% FLUSH
3.0000 mL | Freq: Once | INTRAVENOUS | Status: AC
  Administered 2019-10-06: 3 mL via INTRAVENOUS

## 2019-10-06 MED ORDER — MORPHINE SULFATE (PF) 2 MG/ML IV SOLN
2.0000 mg | INTRAVENOUS | Status: DC | PRN
  Administered 2019-10-07 – 2019-10-08 (×4): 2 mg via INTRAVENOUS
  Filled 2019-10-06 (×4): qty 1

## 2019-10-06 NOTE — Progress Notes (Addendum)
Pharmacy Antibiotic Note  Amy Bryant is a 64 y.o. female admitted on 10/06/2019 with cellulitis. Patient presented with swelling, redness, and weeping to RLE. She was febrile (Max temp 101, last night), WBC elevated at 15.7, Scr elevated from baseline (~1) at 2.40. Dosing weight used 138.6 kg (305lb). Pharmacy has been consulted for vancomycin and cefepime dosing.  Plan: Cefepime 2 gm IV q24hr  Vancomycin 2500 mg IV x 1 dose; then start vancomycin 750 mg IV q24hr   - Goal AUC 400-550.   - Expected AUC: 490   - SCr used: 2.40    - Weight used: 138.6 kg (Patient reported)  Monitor clinical improvement, renal function (baseline ~1) and obtain vancomycin levels as needed.  Temp (24hrs), Avg:98.3 F (36.8 C), Min:98.3 F (36.8 C), Max:98.3 F (36.8 C)  Recent Labs  Lab 10/06/19 1318  WBC 15.7*  CREATININE 2.40*    CrCl cannot be calculated (Unknown ideal weight.).    Allergies  Allergen Reactions  . Pseudoephedrine Other (See Comments)    REACTION: tightness in chest  . Augmentin [Amoxicillin-Pot Clavulanate] Itching and Rash    Did it involve swelling of the face/tongue/throat, SOB, or low BP? No Did it involve sudden or severe rash/hives, skin peeling, or any reaction on the inside of your mouth or nose? Yes Did you need to seek medical attention at a hospital or doctor's office? Yes When did it last happen?2019 If all above answers are "NO", may proceed with cephalosporin use.   . Latex Itching and Rash    Antimicrobials this admission: 1/5 zosyn x1 dose  1/5 Cefepime >>  1/5 vancomycin  >>   Dose adjustments this admission: none  Microbiology results: 1/5 BCx: sent 1/5 C Diff PCR: sent 1/5 GI pathogen panel: sent    Thank you for allowing pharmacy to be a part of this patient's care.  Rosalyn Gess 10/06/2019 3:54 PM

## 2019-10-06 NOTE — ED Notes (Signed)
Patient remains very hypoglycemic upon recheck after juice and food. Charge RN Asher Muir made aware.

## 2019-10-06 NOTE — ED Notes (Signed)
Lab called with critical glucose of 44, pt  Alert and oriented, able to carry on conversation with this RN. Pt provided juice and food. PA petrucelli made aware and advised recheck CBG in 30 minutes.

## 2019-10-06 NOTE — ED Notes (Signed)
Pt CBG 50, pt given orange juice.

## 2019-10-06 NOTE — ED Notes (Signed)
Notified MD of patients blood sugar. 

## 2019-10-06 NOTE — ED Notes (Signed)
MD paged for pt continuing hypoglycemia.

## 2019-10-06 NOTE — ED Notes (Signed)
Pt cleaned, linens changed, and pt repositioned. Pt resting comfortably at this time.

## 2019-10-06 NOTE — ED Notes (Signed)
Pt given another orange juice. Waiting on MD to return page.

## 2019-10-06 NOTE — ED Triage Notes (Signed)
Pt here for evaluation of diarrhea x 3 days, also has swelling, redness, and weeping to RLE.Pt has been nauseas but not vomiting, fever of 101 last night. Generalized abdominal pain. Hx CHF, not more shob than normal. Wears 4L O2 at home.

## 2019-10-06 NOTE — ED Provider Notes (Signed)
[facility] EMERGENCY DEPARTMENT Provider Note   CSN: [id number] Arrival date & time: 10/06/19  1247     History Chief Complaint  Patient presents with  . Leg Swelling  . Diarrhea    Amy Bryant is a 64 y.o. female.  Presents to the emergency department with chief complaint of right leg rash.  Patient states that he started approximately 3 days ago, has been steadily worsening day by day, noted swelling and redness across her entire leg.  Denies any rash or swelling or pain in her groin.  No abdominal pain, no vomiting but has had some diarrhea.  States it appears watery, no blood.  Fever to 101 at home.  Reports similar episode over the summer requiring hospitalization.  No recent injuries.  HPI     Past Medical History:  Diagnosis Date  . Atrial fibrillation (HCC)    with cardioversion success  . CHF (congestive heart failure) (HCC)   . COPD (chronic obstructive pulmonary disease) (HCC)   . Cor pulmonale (HCC)    and obesity hypoventilation syndrome  . Diabetes mellitus without complication (HCC)   . GERD (gastroesophageal reflux disease)   . Hypothyroid   . Morbid obesity (HCC)   . OA (osteoarthritis)   . Pulmonary embolism (HCC)    hx of on coumadin  . Reactive airways dysfunction syndrome [facility])     Patient Active Problem List   Diagnosis Date Noted  . CKD (chronic kidney disease) stage 3, GFR 30-59 ml/min 07/31/2019  . Elevated serum creatinine 06/26/2019  . Acute venous embolism and thrombosis of deep vessels of proximal lower extremity (HCC) 06/04/2019  . Long term (current) use of anticoagulants 06/04/2019  . Primary hypercoagulable state (HCC) 06/04/2019  . Aspiration pneumonia (HCC) 04/11/2019  . Acute on chronic diastolic CHF (congestive heart failure) (HCC) 04/11/2019  . Cellulitis of right lower extremity   . Cellulitis of right leg 03/28/2019  . Acute on chronic respiratory failure with hypoxia (HCC) 03/28/2019  . Panic attacks  01/20/2019  . Iron deficiency anemia: Severe 01/14/2019  . Pressure injury of skin 01/14/2019  . Symptomatic anemia   . Coagulopathy (HCC)   . Chronic anticoagulation   . Absolute anemia 01/08/2019  . Reactive depression (situational) 12/29/2018  . Mobility impaired 12/12/2018  . Hematuria 12/03/2017  . Pedal edema 12/23/2016  . OSA (obstructive sleep apnea) 12/21/2016  . Financial difficulties 09/16/2016  . UTI (urinary tract infection) 11/02/2014  . Colon cancer screening 12/15/2013  . Routine general medical examination at a health care facility 05/20/2012  . Vitamin D deficiency 11/15/2010  . DYSURIA, HX OF 08/19/2009  . PURE HYPERCHOLESTEROLEMIA 08/16/2009  . Diabetes type 2, controlled (HCC) 08/13/2008  . Hyperparathyroidism (HCC) 07/30/2008  . Hypothyroidism 02/13/2008  . Morbid obesity (HCC) 02/04/2008  . Reactive airway disease 02/04/2008  . GERD 02/04/2008  . DISC DISEASE, LUMBAR 02/04/2008  . TOBACCO USE, QUIT 02/04/2008  . Obesity hypoventilation syndrome (HCC) 08/11/2007  . Chronic pulmonary heart disease (HCC) 08/11/2007    Past Surgical History:  Procedure Laterality Date  . BIOPSY  01/12/2019   Procedure: BIOPSY;  Surgeon: Beverley Fiedler, MD;  Location: [facility] ENDOSCOPY;  Service: Gastroenterology;;  . COLONOSCOPY WITH PROPOFOL N/A 01/12/2019   Procedure: COLONOSCOPY WITH PROPOFOL;  Surgeon: Beverley Fiedler, MD;  Location: [facility] ENDOSCOPY;  Service: Gastroenterology;  Laterality: N/A;  . ESOPHAGOGASTRODUODENOSCOPY (EGD) WITH PROPOFOL N/A 01/12/2019   Procedure: ESOPHAGOGASTRODUODENOSCOPY (EGD) WITH PROPOFOL;  Surgeon: Beverley Fiedler, MD;  Location:  [place] ENDOSCOPY;  Service: Gastroenterology;  Laterality: N/A;  . TONSILLECTOMY       OB History   No obstetric history on file.     Family History  Problem Relation Age of Onset  . Heart disease Father 25       MI  . Diabetes Brother     Social History   Tobacco Use  . Smoking status: Former Research scientist (life sciences)  . Smokeless  tobacco: Former Network engineer Use Topics  . Alcohol use: No    Alcohol/week: 0.0 standard drinks  . Drug use: No    Home Medications Prior to Admission medications   Medication Sig Start Date End Date Taking? Authorizing Provider  albuterol (PROVENTIL) (2.5 MG/3ML) 0.083% nebulizer solution INHALE 1 VIAL VIA NEBULIZER EVERY 4 HOURS AS NEEDED FOR WHEEZING 07/24/19   [name], [name], MD  ALPRAZolam (XANAX) 0.5 MG tablet TAKE 1 TABLET BY MOUTH 2 TIMES DAILY AS NEEDED FOR ANXIETY (PANIC ATTACK) 07/26/19   [name], [name], MD  Amino Acids-Protein Hydrolys (FEEDING SUPPLEMENT, PRO-STAT SUGAR FREE 64,) LIQD Take 30 mLs by mouth 2 (two) times daily. 04/11/19   Eugenie Filler, MD  budesonide-formoterol [facility]) 160-4.5 MCG/ACT inhaler INHALE 2 PUFFS INTO LUNGS TWICE A DAY 08/07/19   [name], [name], MD  cholecalciferol (VITAMIN D) 1000 UNITS tablet Take 4,000 Units by mouth daily.    [provider]  ciclopirox (LOPROX) 0.77 % cream APPLY TO AFFECTED AREA TWICE A DAY AS NEEDED Patient taking differently: Apply 1 application topically 2 (two) times daily as needed (skin care under breast).  10/16/18   [name], [name], MD  diltiazem (CARTIA XT) 120 MG 24 hr capsule Take 1 capsule (120 mg total) by mouth daily. 12/03/17   [name], [name], MD  feeding supplement, ENSURE ENLIVE, (ENSURE ENLIVE) LIQD Take 237 mLs by mouth 2 (two) times daily between meals. 04/11/19   Eugenie Filler, MD  FLUoxetine (PROZAC) 40 MG capsule Take 1 capsule (40 mg total) by mouth daily. 07/31/19   [name], [name], MD  furosemide (LASIX) 80 MG tablet Take 1 tablet (80 mg total) by mouth 2 (two) times daily. 07/24/19   [name], Marne A, MD  glipiZIDE (GLUCOTROL XL) 5 MG 24 hr tablet TAKE 1 TABLET BY MOUTH EVERY DAY WITH BREAKFAST 07/24/19   [name], Redbird A, MD  glucose blood test strip Test blood sugar once daily and as needed for DM 250.0 11/19/14   [name], [name], MD  Hydrocortisone (GERHARDT'S BUTT CREAM) CREA Apply 1  application topically 3 (three) times daily as needed for irritation. 04/11/19   Eugenie Filler, MD  iron polysaccharides (NU-IRON) 150 MG capsule Take 1 capsule (150 mg total) by mouth 2 (two) times daily. 03/19/19   [name], [name], MD  Lancets [facility] ULTRASOFT) lancets Test blood sugar once daily and as needed for DM 250.0     [provider]  levothyroxine (SYNTHROID) 175 MCG tablet TAKE 1 TABLET BY MOUTH DAILY BEFORE BREAKFAST. 07/24/19   [name], [name], MD  metFORMIN (GLUCOPHAGE) 1000 MG tablet Take 1 tablet (1,000 mg total) by mouth 2 (two) times daily with a meal. 07/24/19   [name], [name], MD  mometasone (ELOCON) 0.1 % cream Apply 1 application topically See admin instructions. Tiny amount to each ear canal twice weekly as needed.    [provider]  multivitamin (PROSIGHT) TABS tablet Take 1 tablet by mouth daily. 04/12/19   Eugenie Filler, MD  mupirocin ointment (  BACTROBAN) 2 % APPLY TO AFFECTED AREA TWICE A DAY Patient taking differently: Apply 1 application topically as needed (skin).  01/01/19   [name], Audrie GallusMarne A, MD  pantoprazole (PROTONIX) 40 MG tablet Take 1 tablet (40 mg total) by mouth daily at 6 (six) AM. 07/24/19   [name], Audrie GallusMarne A, MD  potassium chloride SA (KLOR-CON M20) 20 MEQ tablet TAKE 2 TABLETS BY MOUTH EVERY DAY 07/31/19   [name], Audrie GallusMarne A, MD  senna-docusate (SENOKOT-S) 8.6-50 MG tablet Take 1 tablet by mouth 2 (two) times daily. 04/11/19   Rodolph Bonghompson, Daniel V, MD  simvastatin (ZOCOR) 20 MG tablet TAKE 1 TABLET BY MOUTH EVERYDAY AT BEDTIME 07/24/19   [name], Audrie GallusMarne A, MD  TIADYLT ER 120 MG 24 hr capsule TAKE 1 CAPSULE BY MOUTH EVERY DAY 08/06/19   [name], Audrie GallusMarne A, MD  traMADol (ULTRAM) 50 MG tablet 1TAB BY MOUTH EVERY 12HRS AS NEEDED FOR MODERATE/SEVERE PAIN. CAUTION OF SEDATION/FALLS/CONSTIPATION 07/26/19   Judy Pimpleower, Marne A, MD  warfarin (COUMADIN) 5 MG tablet Take 1/2 tablet daily except take 1 tablet on Mon and Fridays or TAKE AS DIRECTED BY ANTICOAGULATION  CLINIC 07/24/19   [name], Audrie GallusMarne A, MD    Allergies    Pseudoephedrine, Augmentin [amoxicillin-pot clavulanate], and Latex  Review of Systems   Review of Systems  Constitutional: Negative for chills and fever.  HENT: Negative for ear pain and sore throat.   Eyes: Negative for pain and visual disturbance.  Respiratory: Negative for cough and shortness of breath.   Cardiovascular: Positive for leg swelling. Negative for chest pain and palpitations.  Gastrointestinal: Negative for abdominal pain and vomiting.  Genitourinary: Negative for dysuria and hematuria.  Musculoskeletal: Positive for arthralgias. Negative for back pain.  Skin: Positive for rash. Negative for color change.  Neurological: Negative for seizures and syncope.  All other systems reviewed and are negative.   Physical Exam Updated Vital Signs BP 125/68   Pulse 77   Temp 98.3 F (36.8 C) (Oral)   Resp 18   SpO2 100%   Physical Exam Vitals and nursing note reviewed.  Constitutional:      General: She is not in acute distress.    Appearance: She is well-developed.  HENT:     Head: Normocephalic and atraumatic.  Eyes:     Conjunctiva/sclera: Conjunctivae normal.  Cardiovascular:     Rate and Rhythm: Regular rhythm. Tachycardia present.     Heart sounds: No murmur.  Pulmonary:     Effort: Pulmonary effort is normal. No respiratory distress.     Breath sounds: Normal breath sounds.  Abdominal:     Palpations: Abdomen is soft.     Tenderness: There is no abdominal tenderness.  Genitourinary:    Comments: No tenderness, no crepitus in perineum Musculoskeletal:        General: Normal range of motion.     Cervical back: Neck supple.     Right lower leg: Edema present.     Left lower leg: No edema.  Skin:    Capillary Refill: Capillary refill takes less than 2 seconds.     Comments: Right lower extremity: Erythema, warmth, blanchable across medial right lower extremity extending from proximal upper leg to  ankle, no induration noted, normal joint range of motion throughout  Neurological:     Mental Status: She is alert.  Psychiatric:        Mood and Affect: Mood normal.        Behavior: Behavior normal.     ED Results /  Procedures / Treatments   Labs (all labs ordered are listed, but only abnormal results are displayed) Labs Reviewed  COMPREHENSIVE METABOLIC PANEL - Abnormal; Notable for the following components:      Result Value   Sodium 129 (*)    Potassium 3.2 (*)    Chloride 95 (*)    Glucose, Bld 44 (*)    BUN 57 (*)    Creatinine, Ser 2.40 (*)    Calcium 8.8 (*)    Albumin 2.6 (*)    GFR calc non Af Amer 21 (*)    GFR calc Af Amer 24 (*)    All other components within normal limits  CBC - Abnormal; Notable for the following components:   WBC 15.7 (*)    RBC 3.08 (*)    Hemoglobin 9.3 (*)    HCT 29.2 (*)    All other components within normal limits  BRAIN NATRIURETIC PEPTIDE - Abnormal; Notable for the following components:   B Natriuretic Peptide 152.6 (*)    All other components within normal limits  CBG MONITORING, ED - Abnormal; Notable for the following components:   Glucose-Capillary 46 (*)    All other components within normal limits  CBG MONITORING, ED - Abnormal; Notable for the following components:   Glucose-Capillary 47 (*)    All other components within normal limits  RESPIRATORY PANEL BY RT PCR (FLU A&B, COVID)  CULTURE, BLOOD (ROUTINE X 2)  CULTURE, BLOOD (ROUTINE X 2)  GI PATHOGEN PANEL BY PCR, STOOL  C DIFFICILE QUICK SCREEN W PCR REFLEX  LIPASE, BLOOD  URINALYSIS, ROUTINE W REFLEX MICROSCOPIC  LACTIC ACID, PLASMA  LACTIC ACID, PLASMA  POC SARS CORONAVIRUS 2 AG -  ED    EKG None  Radiology DG Chest Portable 1 View  Result Date: 10/06/2019 CLINICAL DATA:  Shortness of breath, fever EXAM: PORTABLE CHEST 1 VIEW COMPARISON:  04/03/2019 FINDINGS: The heart size and mediastinal contours are within normal limits. Both lungs are clear. The  visualized skeletal structures are unremarkable. IMPRESSION: No active disease. Electronically Signed   By: Elige Ko   On: 10/06/2019 16:26    Procedures Procedures (including critical care time)  Medications Ordered in ED Medications  sodium chloride flush (NS) 0.9 % injection 3 mL (has no administration in time range)  dextrose 50 % solution (has no administration in time range)  piperacillin-tazobactam (ZOSYN) IVPB 3.375 g (has no administration in time range)  lactated ringers bolus 1,000 mL (has no administration in time range)  vancomycin (VANCOCIN) 2,500 mg in sodium chloride 0.9 % 500 mL IVPB (has no administration in time range)  vancomycin (VANCOREADY) IVPB 750 mg/150 mL (has no administration in time range)  dextrose 50 % solution 50 mL (50 mLs Intravenous Given 10/06/19 1523)    ED Course  I have reviewed the triage vital signs and the nursing notes.  Pertinent labs & imaging results that were available during my care of the patient were reviewed by me and considered in my medical decision making (see chart for details).  Clinical Course as of Oct 05 2242  Tue Oct 06, 2019  1844 Reviewed CTs, will admit medicine   [RD]  1849 Discussed with Mikeal Hawthorne who accepts   [RD]    Clinical Course User Index [RD] Milagros Loll, MD   MDM Rules/Calculators/A&P                      64 year old presents to ER with right leg rash.  No fever here.  Abdomen soft.  Given reported extensive diarrhea, leukocytosis, check CT abdomen pelvis, this was negative.  No abscess or fluid collection noted on CT of her right lower extremity.  Started broad-spectrum antibiotics vancomycin and Zosyn for her cellulitis.  Obtain blood cultures.  Consulted hospitalist for admission.  Noted hypoglycemia, gave D50, still dropped, started on D5NS gtt.   Dr. Mikeal Hawthorne will admit to hospitalist service.  Final Clinical Impression(s) / ED Diagnoses Final diagnoses:  Cellulitis of right lower extremity    Leukocytosis, unspecified type    Rx / DC Orders ED Discharge Orders    None       Milagros Loll, MD 10/06/19 2249

## 2019-10-06 NOTE — H&P (Signed)
History and Physical   PABLO MATHURIN XLK:[id number] DOB: 1956/05/26 DOA: 10/06/2019  Referring MD/NP/PA: Dr. Stevie Kern  PCP: Judy Pimple, MD   Outpatient Specialists: None  Patient coming from: Home  Chief Complaint: Right lower extremity swelling  HPI: Amy Bryant is a 64 y.o. female with medical history significant of atrial fibrillation, diastolic CHF, COPD, diabetes, GERD, hypothyroidism, morbidly obese with history of PE and reactive airway disease who presented with right lower extremity swelling, pain redness and ulcers.  She had a previous presentation with cellulitis and ulcer about 6 months ago where she was admitted and treated.  Patient is leg has always been swollen due to lymphedema chronically but this get worse in the last 3 days.  New also as well as worsening redness.  Some fever.  Patient came in where she is evaluated and treated..  ED Course: Temperature 99.6 blood pressure 100/69 pulse 84 respirate 22 oxygen sat 96% room air.  PT INR 28.72.7.  Lactic acid 1.1.  COVID-19 so far negative.  Sodium 129 potassium of 3.2 chloride 95 CO2 of 23 glucose 44 BUN 57 creatinine 2.40.  White count 15.7 hemoglobin 9.3 platelet 172.  Review of Systems: As per HPI otherwise 10 point review of systems negative.    Past Medical History:  Diagnosis Date  . Atrial fibrillation (HCC)    with cardioversion success  . CHF (congestive heart failure) (HCC)   . COPD (chronic obstructive pulmonary disease) (HCC)   . Cor pulmonale (HCC)    and obesity hypoventilation syndrome  . Diabetes mellitus without complication (HCC)   . GERD (gastroesophageal reflux disease)   . Hypothyroid   . Morbid obesity (HCC)   . OA (osteoarthritis)   . Pulmonary embolism (HCC)    hx of on coumadin  . Reactive airways dysfunction syndrome [facility])     Past Surgical History:  Procedure Laterality Date  . BIOPSY  01/12/2019   Procedure: BIOPSY;  Surgeon: Beverley Fiedler, MD;  Location: [facility] ENDOSCOPY;   Service: Gastroenterology;;  . COLONOSCOPY WITH PROPOFOL N/A 01/12/2019   Procedure: COLONOSCOPY WITH PROPOFOL;  Surgeon: Beverley Fiedler, MD;  Location: [facility] ENDOSCOPY;  Service: Gastroenterology;  Laterality: N/A;  . ESOPHAGOGASTRODUODENOSCOPY (EGD) WITH PROPOFOL N/A 01/12/2019   Procedure: ESOPHAGOGASTRODUODENOSCOPY (EGD) WITH PROPOFOL;  Surgeon: Beverley Fiedler, MD;  Location[facility] ENDOSCOPY;  Service: Gastroenterology;  Laterality: N/A;  . TONSILLECTOMY       reports that she has quit smoking. She has quit using smokeless tobacco. She reports that she does not drink alcohol or use drugs.  Allergies  Allergen Reactions  . Pseudoephedrine Other (See Comments)    REACTION: tightness in chest  . Augmentin [Amoxicillin-Pot Clavulanate] Itching and Rash    Did it involve swelling of the face/tongue/throat, SOB, or low BP? No Did it involve sudden or severe rash/hives, skin peeling, or any reaction on the inside of your mouth or nose? Yes Did you need to seek medical attention at a hospital or doctor's office? Yes When did it last happen?2019 If all above answers are "NO", may proceed with cephalosporin use.   . Latex Itching and Rash    Family History  Problem Relation Age of Onset  . Heart disease Father 63       MI  . Diabetes Brother      Prior to Admission medications   Medication Sig Start Date End Date Taking? Authorizing Provider  albuterol (PROVENTIL) (2.5 MG/3ML) 0.083% nebulizer solution INHALE 1 VIAL VIA NEBULIZER  EVERY 4 HOURS AS NEEDED FOR WHEEZING 07/24/19   [name], [name], MD  ALPRAZolam (XANAX) 0.5 MG tablet TAKE 1 TABLET BY MOUTH 2 TIMES DAILY AS NEEDED FOR ANXIETY (PANIC ATTACK) 07/26/19   [name], [name], MD  Amino Acids-Protein Hydrolys (FEEDING SUPPLEMENT, PRO-STAT SUGAR FREE 64,) LIQD Take 30 mLs by mouth 2 (two) times daily. 04/11/19   Rodolph Bong, MD  budesonide-formoterol [facility]) 160-4.5 MCG/ACT inhaler INHALE 2 PUFFS INTO LUNGS TWICE A DAY 08/07/19    [name], [name], MD  cholecalciferol (VITAMIN D) 1000 UNITS tablet Take 4,000 Units by mouth daily.    [provider]  ciclopirox (LOPROX) 0.77 % cream APPLY TO AFFECTED AREA TWICE A DAY AS NEEDED Patient taking differently: Apply 1 application topically 2 (two) times daily as needed (skin care under breast).  10/16/18   [name], [name], MD  diltiazem (CARTIA XT) 120 MG 24 hr capsule Take 1 capsule (120 mg total) by mouth daily. 12/03/17   [name], [name], MD  feeding supplement, ENSURE ENLIVE, (ENSURE ENLIVE) LIQD Take 237 mLs by mouth 2 (two) times daily between meals. 04/11/19   Rodolph Bong, MD  FLUoxetine (PROZAC) 40 MG capsule Take 1 capsule (40 mg total) by mouth daily. 07/31/19   [name], [name], MD  furosemide (LASIX) 80 MG tablet Take 1 tablet (80 mg total) by mouth 2 (two) times daily. 07/24/19   [name], Marne A, MD  glipiZIDE (GLUCOTROL XL) 5 MG 24 hr tablet TAKE 1 TABLET BY MOUTH EVERY DAY WITH BREAKFAST 07/24/19   [name], Mesilla A, MD  glucose blood test strip Test blood sugar once daily and as needed for DM 250.0 11/19/14   [name], [name], MD  Hydrocortisone (GERHARDT'S BUTT CREAM) CREA Apply 1 application topically 3 (three) times daily as needed for irritation. 04/11/19   Rodolph Bong, MD  iron polysaccharides (NU-IRON) 150 MG capsule Take 1 capsule (150 mg total) by mouth 2 (two) times daily. 03/19/19   [name], [name], MD  Lancets [facility] ULTRASOFT) lancets Test blood sugar once daily and as needed for DM 250.0     [provider]  levothyroxine (SYNTHROID) 175 MCG tablet TAKE 1 TABLET BY MOUTH DAILY BEFORE BREAKFAST. 07/24/19   [name], [name], MD  metFORMIN (GLUCOPHAGE) 1000 MG tablet Take 1 tablet (1,000 mg total) by mouth 2 (two) times daily with a meal. 07/24/19   [name], [name], MD  mometasone (ELOCON) 0.1 % cream Apply 1 application topically See admin instructions. Tiny amount to each ear canal twice weekly as needed.    [provider]    multivitamin (PROSIGHT) TABS tablet Take 1 tablet by mouth daily. 04/12/19   Rodolph Bong, MD  mupirocin ointment (BACTROBAN) 2 % APPLY TO AFFECTED AREA TWICE A DAY Patient taking differently: Apply 1 application topically as needed (skin).  01/01/19   [name], [name], MD  pantoprazole (PROTONIX) 40 MG tablet Take 1 tablet (40 mg total) by mouth daily at 6 (six) AM. 07/24/19   [name], [name], MD  potassium chloride SA (KLOR-CON M20) 20 MEQ tablet TAKE 2 TABLETS BY MOUTH EVERY DAY 07/31/19   [name], [name], MD  senna-docusate (SENOKOT-S) 8.6-50 MG tablet Take 1 tablet by mouth 2 (two) times daily. 04/11/19   Rodolph Bong, MD  simvastatin (ZOCOR) 20 MG tablet TAKE 1 TABLET BY MOUTH EVERYDAY AT BEDTIME 07/24/19   [name], [name], MD  TIADYLT ER 120 MG 24 hr capsule TAKE 1  CAPSULE BY MOUTH EVERY DAY 08/06/19   [name], Audrie GallusMarne A, MD  traMADol (ULTRAM) 50 MG tablet 1TAB BY MOUTH EVERY 12HRS AS NEEDED FOR MODERATE/SEVERE PAIN. CAUTION OF SEDATION/FALLS/CONSTIPATION 07/26/19   Judy Pimpleower, Marne A, MD  warfarin (COUMADIN) 5 MG tablet Take 1/2 tablet daily except take 1 tablet on Mon and Fridays or TAKE AS DIRECTED BY ANTICOAGULATION CLINIC 07/24/19   Judy Pimpleower, Marne A, MD    Physical Exam: Vitals:   10/06/19 1530 10/06/19 1545 10/06/19 1600 10/06/19 1615  BP: 112/66 110/73 (!) 109/59 125/68  Pulse: 74 77 73 77  Resp: (!) 21 17 (!) 22 18  Temp:      TempSrc:      SpO2: 100% 100% 100% 100%      Constitutional: Morbidly obese and weak Vitals:   10/06/19 1530 10/06/19 1545 10/06/19 1600 10/06/19 1615  BP: 112/66 110/73 (!) 109/59 125/68  Pulse: 74 77 73 77  Resp: (!) 21 17 (!) 22 18  Temp:      TempSrc:      SpO2: 100% 100% 100% 100%   Eyes: PERRL, lids and conjunctivae normal ENMT: Mucous membranes are moist. Posterior pharynx clear of any exudate or lesions.Normal dentition.  Neck: normal, supple, no masses, no thyromegaly Respiratory: clear to auscultation bilaterally, no wheezing, no  crackles. Normal respiratory effort. No accessory muscle use.  Cardiovascular: Regular rate and rhythm, no murmurs / rubs / gallops. No extremity edema. 2+ pedal pulses. No carotid bruits.  Abdomen: no tenderness, no masses palpated. No hepatosplenomegaly. Bowel sounds positive.  Musculoskeletal: no clubbing / cyanosis. No joint deformity upper and lower extremities. Good ROM, no contractures. Normal muscle tone.  Skin: Right lower extremity swollen tender ulcers over the shin and the foot, red warm no rashes, lesions, ulcers. No induration Neurologic: CN 2-12 grossly intact. Sensation intact, DTR normal. Strength 5/5 in all 4.  Psychiatric: Normal judgment and insight. Alert and oriented x 3. Normal mood.     Labs on Admission: I have personally reviewed following labs and imaging studies  CBC: Recent Labs  Lab 10/06/19 1318  WBC 15.7*  HGB 9.3*  HCT 29.2*  MCV 94.8  PLT 172   Basic Metabolic Panel: Recent Labs  Lab 10/06/19 1318  NA 129*  K 3.2*  CL 95*  CO2 23  GLUCOSE 44*  BUN 57*  CREATININE 2.40*  CALCIUM 8.8*   GFR: CrCl cannot be calculated (Unknown ideal weight.). Liver Function Tests: Recent Labs  Lab 10/06/19 1318  AST 38  ALT 17  ALKPHOS 93  BILITOT 0.7  PROT 6.6  ALBUMIN 2.6*   Recent Labs  Lab 10/06/19 1318  LIPASE 30   No results for input(s): AMMONIA in the last 168 hours. Coagulation Profile: No results for input(s): INR, PROTIME in the last 168 hours. Cardiac Enzymes: No results for input(s): CKTOTAL, CKMB, CKMBINDEX, TROPONINI in the last 168 hours. BNP (last 3 results) No results for input(s): PROBNP in the last 8760 hours. HbA1C: No results for input(s): HGBA1C in the last 72 hours. CBG: Recent Labs  Lab 10/06/19 1455 10/06/19 1519 10/06/19 1834  GLUCAP 46* 47* 48*   Lipid Profile: No results for input(s): CHOL, HDL, LDLCALC, TRIG, CHOLHDL, LDLDIRECT in the last 72 hours. Thyroid Function Tests: No results for input(s):  TSH, T4TOTAL, FREET4, T3FREE, THYROIDAB in the last 72 hours. Anemia Panel: No results for input(s): VITAMINB12, FOLATE, FERRITIN, TIBC, IRON, RETICCTPCT in the last 72 hours. Urine analysis:    Component Value Date/Time  COLORURINE YELLOW 04/02/2019 1008   APPEARANCEUR HAZY (A) 04/02/2019 1008   LABSPEC 1.012 04/02/2019 1008   PHURINE 6.0 04/02/2019 1008   GLUCOSEU NEGATIVE 04/02/2019 1008   GLUCOSEU NEGATIVE 02/21/2007 1652   HGBUR NEGATIVE 04/02/2019 1008   HGBUR negative 04/21/2010 1548   BILIRUBINUR NEGATIVE 04/02/2019 1008   BILIRUBINUR Negative 12/03/2017 1443   KETONESUR NEGATIVE 04/02/2019 1008   PROTEINUR 30 (A) 04/02/2019 1008   UROBILINOGEN 0.2 12/03/2017 1443   UROBILINOGEN 0.2 04/21/2010 1548   NITRITE NEGATIVE 04/02/2019 1008   LEUKOCYTESUR TRACE (A) 04/02/2019 1008   Sepsis Labs: @LABRCNTIP (procalcitonin:4,lacticidven:4) ) Recent Results (from the past 240 hour(s))  Respiratory Panel by RT PCR (Flu A&B, Covid) - Nasopharyngeal Swab     Status: None   Collection Time: 10/06/19  3:49 PM   Specimen: Nasopharyngeal Swab  Result Value Ref Range Status   SARS Coronavirus 2 by RT PCR NEGATIVE NEGATIVE Final    Comment: (NOTE) SARS-CoV-2 target nucleic acids are NOT DETECTED. The SARS-CoV-2 RNA is generally detectable in upper respiratoy specimens during the acute phase of infection. The lowest concentration of SARS-CoV-2 viral copies this assay can detect is 131 copies/mL. A negative result does not preclude SARS-Cov-2 infection and should not be used as the sole basis for treatment or other patient management decisions. A negative result may occur with  improper specimen collection/handling, submission of specimen other than nasopharyngeal swab, presence of viral mutation(s) within the areas targeted by this assay, and inadequate number of viral copies (<131 copies/mL). A negative result must be combined with clinical observations, patient history, and  epidemiological information. The expected result is Negative. Fact Sheet for Patients:  12/04/19 Fact Sheet for Healthcare Providers:  https://www.moore.com/ This test is not yet ap proved or cleared by the https://www.young.biz/ FDA and  has been authorized for detection and/or diagnosis of SARS-CoV-2 by FDA under an Emergency Use Authorization (EUA). This EUA will remain  in effect (meaning this test can be used) for the duration of the COVID-19 declaration under Section 564(b)(1) of the Act, 21 U.S.C. section 360bbb-3(b)(1), unless the authorization is terminated or revoked sooner.    Influenza A by PCR NEGATIVE NEGATIVE Final   Influenza B by PCR NEGATIVE NEGATIVE Final    Comment: (NOTE) The Xpert Xpress SARS-CoV-2/FLU/RSV assay is intended as an aid in  the diagnosis of influenza from Nasopharyngeal swab specimens and  should not be used as a sole basis for treatment. Nasal washings and  aspirates are unacceptable for Xpert Xpress SARS-CoV-2/FLU/RSV  testing. Fact Sheet for Patients: [place] Fact Sheet for Healthcare Providers: https://www.moore.com/ This test is not yet approved or cleared by the https://www.young.biz/ FDA and  has been authorized for detection and/or diagnosis of SARS-CoV-2 by  FDA under an Emergency Use Authorization (EUA). This EUA will remain  in effect (meaning this test can be used) for the duration of the  Covid-19 declaration under Section 564(b)(1) of the Act, 21  U.S.C. section 360bbb-3(b)(1), unless the authorization is  terminated or revoked. Performed at [facility], [address] [place], [place]      Radiological Exams on Admission: CT ABDOMEN PELVIS WO CONTRAST  Result Date: 10/06/2019 CLINICAL DATA:  Diffuse abdominal pain and diarrhea. Leukocytosis. EXAM: CT ABDOMEN AND PELVIS WITHOUT CONTRAST TECHNIQUE: Multidetector CT imaging of  the abdomen and pelvis was performed following the standard protocol without IV contrast. COMPARISON:  03/28/2019 FINDINGS: Lower chest: No acute findings. Hepatobiliary: No mass visualized on this unenhanced exam.  Gallstones are seen, however there is no evidence of cholecystitis or biliary dilatation. Pancreas: No mass or inflammatory process visualized on this unenhanced exam. Spleen:  Within normal limits in size. Adrenals/Urinary tract: Stable 1.5 cm fat attenuation right adrenal mass, consistent with benign myelolipoma. 5 mm calculus is seen in the lower pole of the left kidney, without significant change. There is no evidence ureteral calculi or hydronephrosis . Unremarkable unopacified urinary bladder. Stomach/Bowel: No evidence of obstruction, inflammatory process, or abnormal fluid collections. Diverticulosis is seen mainly involving the descending and sigmoid colon, however there is no evidence of diverticulitis. Vascular/Lymphatic: 13 mm right external iliac lymph node has decreased in size from 17 mm on prior study. Several bilateral inguinal lymph nodes are also stable, largest on the right measuring 16 mm on image 88/2. A 13 mm left paraaortic lymph node is unchanged. No new or or increased lymphadenopathy identified. Aortic atherosclerosis incidentally noted. No evidence of abdominal aortic aneurysm. Reproductive:  No mass or other significant abnormality. Other:  None. Musculoskeletal: No suspicious bone lesions identified. Severe thoracolumbar degenerative spondylosis again noted. IMPRESSION: 1. No acute findings within the abdomen or pelvis. 2. Stable 5 mm left renal calculus. No evidence of ureteral calculi or hydronephrosis. 3. Cholelithiasis. No radiographic evidence of cholecystitis. 4. Colonic diverticulosis. No radiographic evidence of diverticulitis. 5. Stable small right adrenal benign myelolipoma. 6. Stable to slightly decreased size of bilateral inguinal, right external iliac, and left  paraaortic lymph nodes. Aortic Atherosclerosis (ICD10-I70.0). Electronically Signed   By: Danae OrleansJohn A Stahl M.D.   On: 10/06/2019 18:05   DG Chest Portable 1 View  Result Date: 10/06/2019 CLINICAL DATA:  Shortness of breath, fever EXAM: PORTABLE CHEST 1 VIEW COMPARISON:  04/03/2019 FINDINGS: The heart size and mediastinal contours are within normal limits. Both lungs are clear. The visualized skeletal structures are unremarkable. IMPRESSION: No active disease. Electronically Signed   By: Elige KoHetal  Patel   On: 10/06/2019 16:26     Assessment/Plan Principal Problem:   Cellulitis of right lower extremity Active Problems:   Hypothyroidism   Diabetes type 2, controlled (HCC)   Hyperparathyroidism (HCC)   Morbid obesity (HCC)   Obesity hypoventilation syndrome (HCC)   GERD   TOBACCO USE, QUIT   CKD (chronic kidney disease) stage 3, GFR 30-59 ml/min   Cellulitis of right lower leg   Hypoglycemia     #1 cellulitis of the right lower extremity: Ulcer also which is infected.  Admit the patient.  Due to diabetes we will put the patient on Vanco and cefepime.  Wound care consult.  May need surgical consult if no significant improvement.  Continue close monitoring.  #2 hypothyroidism:.  Continue replacement.  #3 diabetes: Sliding scale insulin.  #4 GERD: Continue PPI  #5 chronic kidney disease stage III: Continue treatment.  #6 morbid obesity: Dietary counseling  #7 hyponatremia: Hydrate patient with saline.  Probably due to dehydration.  #8 hypokalemia: Replete potassium   DVT prophylaxis: Lovenox Code Status: Full code Family Communication: No family at bedside Disposition Plan: Home Consults called: None Admission status: Inpatient  Severity of Illness: The appropriate patient status for this patient is INPATIENT. Inpatient status is judged to be reasonable and necessary in order to provide the required intensity of service to ensure the patient's safety. The patient's presenting  symptoms, physical exam findings, and initial radiographic and laboratory data in the context of their chronic comorbidities is felt to place them at high risk for further clinical deterioration. Furthermore, it is not anticipated that  the patient will be medically stable for discharge from the hospital within 2 midnights of admission. The following factors support the patient status of inpatient.   " The patient's presenting symptoms include right lower extremities swelling. " The worrisome physical exam findings include swollen right lower extremity. " The initial radiographic and laboratory data are worrisome because of chronic kidney disease and low potassium and sodium. " The chronic co-morbidities include diabetes hypertension.   * I certify that at the point of admission it is my clinical judgment that the patient will require inpatient hospital care spanning beyond 2 midnights from the point of admission due to high intensity of service, high risk for further deterioration and high frequency of surveillance required.Barbette Merino MD [facility] Pager [phone]  If 7PM-7AM, please contact night-coverage [email or web address] Password [facility]  10/06/2019, 6:54 PM

## 2019-10-07 ENCOUNTER — Encounter (HOSPITAL_COMMUNITY): Payer: Self-pay | Admitting: Internal Medicine

## 2019-10-07 DIAGNOSIS — E662 Morbid (severe) obesity with alveolar hypoventilation: Secondary | ICD-10-CM

## 2019-10-07 DIAGNOSIS — E1121 Type 2 diabetes mellitus with diabetic nephropathy: Secondary | ICD-10-CM

## 2019-10-07 DIAGNOSIS — N1832 Chronic kidney disease, stage 3b: Secondary | ICD-10-CM

## 2019-10-07 DIAGNOSIS — E162 Hypoglycemia, unspecified: Secondary | ICD-10-CM

## 2019-10-07 DIAGNOSIS — E039 Hypothyroidism, unspecified: Secondary | ICD-10-CM

## 2019-10-07 DIAGNOSIS — Z87891 Personal history of nicotine dependence: Secondary | ICD-10-CM

## 2019-10-07 DIAGNOSIS — E213 Hyperparathyroidism, unspecified: Secondary | ICD-10-CM

## 2019-10-07 LAB — GLUCOSE, CAPILLARY
Glucose-Capillary: 55 mg/dL — ABNORMAL LOW (ref 70–99)
Glucose-Capillary: 61 mg/dL — ABNORMAL LOW (ref 70–99)
Glucose-Capillary: 63 mg/dL — ABNORMAL LOW (ref 70–99)
Glucose-Capillary: 65 mg/dL — ABNORMAL LOW (ref 70–99)
Glucose-Capillary: 68 mg/dL — ABNORMAL LOW (ref 70–99)
Glucose-Capillary: 73 mg/dL (ref 70–99)
Glucose-Capillary: 83 mg/dL (ref 70–99)
Glucose-Capillary: 84 mg/dL (ref 70–99)
Glucose-Capillary: 85 mg/dL (ref 70–99)
Glucose-Capillary: 92 mg/dL (ref 70–99)

## 2019-10-07 LAB — URINALYSIS, ROUTINE W REFLEX MICROSCOPIC
Bilirubin Urine: NEGATIVE
Glucose, UA: NEGATIVE mg/dL
Ketones, ur: NEGATIVE mg/dL
Leukocytes,Ua: NEGATIVE
Nitrite: NEGATIVE
Protein, ur: 30 mg/dL — AB
Specific Gravity, Urine: 1.012 (ref 1.005–1.030)
pH: 5 (ref 5.0–8.0)

## 2019-10-07 LAB — COMPREHENSIVE METABOLIC PANEL
ALT: 18 U/L (ref 0–44)
AST: 38 U/L (ref 15–41)
Albumin: 2.1 g/dL — ABNORMAL LOW (ref 3.5–5.0)
Alkaline Phosphatase: 89 U/L (ref 38–126)
Anion gap: 11 (ref 5–15)
BUN: 54 mg/dL — ABNORMAL HIGH (ref 8–23)
CO2: 23 mmol/L (ref 22–32)
Calcium: 8.3 mg/dL — ABNORMAL LOW (ref 8.9–10.3)
Chloride: 98 mmol/L (ref 98–111)
Creatinine, Ser: 2.25 mg/dL — ABNORMAL HIGH (ref 0.44–1.00)
GFR calc Af Amer: 26 mL/min — ABNORMAL LOW (ref >60)
GFR calc non Af Amer: 22 mL/min — ABNORMAL LOW (ref >60)
Glucose, Bld: 74 mg/dL (ref 70–99)
Potassium: 3.9 mmol/L (ref 3.5–5.1)
Sodium: 132 mmol/L — ABNORMAL LOW (ref 135–145)
Total Bilirubin: 0.7 mg/dL (ref 0.3–1.2)
Total Protein: 5.6 g/dL — ABNORMAL LOW (ref 6.5–8.1)

## 2019-10-07 LAB — CBC
HCT: 25.9 % — ABNORMAL LOW (ref 36.0–46.0)
Hemoglobin: 8.3 g/dL — ABNORMAL LOW (ref 12.0–15.0)
MCH: 30.5 pg (ref 26.0–34.0)
MCHC: 32 g/dL (ref 30.0–36.0)
MCV: 95.2 fL (ref 80.0–100.0)
Platelets: 146 10*3/uL — ABNORMAL LOW (ref 150–400)
RBC: 2.72 MIL/uL — ABNORMAL LOW (ref 3.87–5.11)
RDW: 13.2 % (ref 11.5–15.5)
WBC: 13 10*3/uL — ABNORMAL HIGH (ref 4.0–10.5)
nRBC: 0 % (ref 0.0–0.2)

## 2019-10-07 LAB — LACTIC ACID, PLASMA: Lactic Acid, Venous: 1.2 mmol/L (ref 0.5–1.9)

## 2019-10-07 LAB — PROTIME-INR
INR: 3.2 — ABNORMAL HIGH (ref 0.8–1.2)
Prothrombin Time: 32.7 seconds — ABNORMAL HIGH (ref 11.4–15.2)

## 2019-10-07 LAB — HEMOGLOBIN A1C
Hgb A1c MFr Bld: 5.2 % (ref 4.8–5.6)
Mean Plasma Glucose: 102.54 mg/dL

## 2019-10-07 MED ORDER — ALPRAZOLAM 0.5 MG PO TABS
0.5000 mg | ORAL_TABLET | Freq: Two times a day (BID) | ORAL | Status: DC | PRN
  Administered 2019-10-08 (×2): 0.5 mg via ORAL
  Filled 2019-10-07 (×2): qty 1

## 2019-10-07 MED ORDER — VITAMIN D 25 MCG (1000 UNIT) PO TABS
4000.0000 [IU] | ORAL_TABLET | Freq: Every day | ORAL | Status: DC
  Administered 2019-10-07 – 2019-10-09 (×3): 4000 [IU] via ORAL
  Filled 2019-10-07 (×3): qty 4

## 2019-10-07 MED ORDER — GLIPIZIDE ER 5 MG PO TB24: 5.0000 mg | ORAL_TABLET | Freq: Every day | ORAL | Status: DC

## 2019-10-07 MED ORDER — DILTIAZEM HCL ER COATED BEADS 120 MG PO CP24: 120.0000 mg | ORAL_CAPSULE | Freq: Every day | ORAL | Status: DC

## 2019-10-07 MED ORDER — MOMETASONE FUROATE 0.1 % EX CREA: 1.0000 "application " | TOPICAL_CREAM | CUTANEOUS | Status: DC

## 2019-10-07 MED ORDER — FLUTICASONE FUROATE-VILANTEROL 200-25 MCG/INH IN AEPB
1.0000 | INHALATION_SPRAY | Freq: Every day | RESPIRATORY_TRACT | Status: DC
  Administered 2019-10-08 – 2019-10-09 (×2): 1 via RESPIRATORY_TRACT
  Filled 2019-10-07: qty 28

## 2019-10-07 MED ORDER — DIPHENHYDRAMINE HCL 25 MG PO CAPS
25.0000 mg | ORAL_CAPSULE | Freq: Once | ORAL | Status: AC
  Administered 2019-10-08: 23:00:00 25 mg via ORAL
  Filled 2019-10-07: qty 1

## 2019-10-07 MED ORDER — SIMVASTATIN 20 MG PO TABS: 20.0000 mg | ORAL_TABLET | Freq: Every day | ORAL | Status: DC

## 2019-10-07 MED ORDER — CICLOPIROX OLAMINE 0.77 % EX CREA: 1.0000 "application " | TOPICAL_CREAM | Freq: Two times a day (BID) | CUTANEOUS | Status: DC | PRN

## 2019-10-07 MED ORDER — FLUOXETINE HCL 20 MG PO CAPS
40.0000 mg | ORAL_CAPSULE | Freq: Every day | ORAL | Status: DC
  Administered 2019-10-07 – 2019-10-09 (×3): 40 mg via ORAL
  Filled 2019-10-07 (×3): qty 2

## 2019-10-07 MED ORDER — ALBUTEROL SULFATE (2.5 MG/3ML) 0.083% IN NEBU
2.5000 mg | INHALATION_SOLUTION | Freq: Four times a day (QID) | RESPIRATORY_TRACT | Status: DC | PRN
  Administered 2019-10-07: 09:00:00 2.5 mg via RESPIRATORY_TRACT
  Filled 2019-10-07: qty 3

## 2019-10-07 MED ORDER — LEVOTHYROXINE SODIUM 75 MCG PO TABS
175.0000 ug | ORAL_TABLET | Freq: Every day | ORAL | Status: DC
  Administered 2019-10-08 – 2019-10-09 (×2): 175 ug via ORAL
  Filled 2019-10-07 (×2): qty 1

## 2019-10-07 MED ORDER — RISAQUAD PO CAPS
1.0000 | ORAL_CAPSULE | Freq: Every day | ORAL | Status: DC
  Administered 2019-10-07 – 2019-10-09 (×3): 1 via ORAL
  Filled 2019-10-07 (×3): qty 1

## 2019-10-07 MED ORDER — PANTOPRAZOLE SODIUM 40 MG PO TBEC
40.0000 mg | DELAYED_RELEASE_TABLET | Freq: Every day | ORAL | Status: DC
  Administered 2019-10-08 – 2019-10-09 (×2): 40 mg via ORAL
  Filled 2019-10-07 (×2): qty 1

## 2019-10-07 MED ORDER — SENNOSIDES-DOCUSATE SODIUM 8.6-50 MG PO TABS: 1.0000 | ORAL_TABLET | Freq: Two times a day (BID) | ORAL | Status: DC

## 2019-10-07 MED ORDER — ATORVASTATIN CALCIUM 10 MG PO TABS
10.0000 mg | ORAL_TABLET | Freq: Every day | ORAL | Status: DC
  Administered 2019-10-08: 17:00:00 10 mg via ORAL
  Filled 2019-10-07: qty 1

## 2019-10-07 MED ORDER — MUPIROCIN 2 % EX OINT
TOPICAL_OINTMENT | Freq: Every day | CUTANEOUS | Status: DC
  Filled 2019-10-07: qty 22

## 2019-10-07 MED ORDER — INSULIN ASPART 100 UNIT/ML ~~LOC~~ SOLN: 0.0000 [IU] | Freq: Every day | SUBCUTANEOUS | Status: DC

## 2019-10-07 MED ORDER — POTASSIUM CHLORIDE CRYS ER 20 MEQ PO TBCR
40.0000 meq | EXTENDED_RELEASE_TABLET | Freq: Every day | ORAL | Status: DC
  Administered 2019-10-07 – 2019-10-09 (×3): 40 meq via ORAL
  Filled 2019-10-07 (×3): qty 2

## 2019-10-07 MED ORDER — WARFARIN - PHARMACIST DOSING INPATIENT: Freq: Every day | Status: DC

## 2019-10-07 MED ORDER — DILTIAZEM HCL ER COATED BEADS 120 MG PO CP24
120.0000 mg | ORAL_CAPSULE | Freq: Every day | ORAL | Status: DC
  Administered 2019-10-08 – 2019-10-09 (×2): 120 mg via ORAL
  Filled 2019-10-07 (×2): qty 1

## 2019-10-07 MED ORDER — TRAMADOL HCL 50 MG PO TABS
50.0000 mg | ORAL_TABLET | Freq: Two times a day (BID) | ORAL | Status: DC | PRN
  Administered 2019-10-08 (×2): 50 mg via ORAL
  Filled 2019-10-07 (×3): qty 1

## 2019-10-07 MED ORDER — INFLUENZA VAC SPLIT QUAD 0.5 ML IM SUSY
0.5000 mL | PREFILLED_SYRINGE | INTRAMUSCULAR | Status: AC
  Administered 2019-10-08: 0.5 mL via INTRAMUSCULAR
  Filled 2019-10-07: qty 0.5

## 2019-10-07 MED ORDER — INSULIN ASPART 100 UNIT/ML ~~LOC~~ SOLN: 0.0000 [IU] | Freq: Three times a day (TID) | SUBCUTANEOUS | Status: DC

## 2019-10-07 NOTE — Progress Notes (Signed)
Dressing done per order

## 2019-10-07 NOTE — Plan of Care (Signed)

## 2019-10-07 NOTE — Progress Notes (Signed)
Patient's blood sugar 63 upon arriving to [facility]N at 0039. Symptomatic: Shaking, dizziness, and nausea. Patient states she has felt like this for several days.  Patient given juice, gram crackers and peanut butter.  Zofran administered at 0049  Rechecked glucose = 85 at 0138

## 2019-10-07 NOTE — Progress Notes (Signed)
Inpatient Diabetes Program Recommendations  AACE/ADA: New Consensus Statement on Inpatient Glycemic Control (2015)  Target Ranges:  Prepandial:   less than 140 mg/dL      Peak postprandial:   less than 180 mg/dL (1-2 hours)      Critically ill patients:  140 - 180 mg/dL   Lab Results  Component Value Date   GLUCAP 73 10/07/2019   HGBA1C 5.2 10/07/2019    Review of Glycemic Control Results for Amy Bryant, Amy Bryant (MRN [id number]) as of 10/07/2019 11:37  Ref. Range 10/06/2019 14:55 10/06/2019 15:19 10/06/2019 18:34 10/06/2019 22:29 10/06/2019 22:48 10/06/2019 23:00 10/06/2019 23:18 10/06/2019 23:29 10/07/2019 00:05 10/07/2019 00:39 10/07/2019 00:53 10/07/2019 01:38 10/07/2019 06:36 10/07/2019 07:31 10/07/2019 08:31 10/07/2019 11:29  Glucose-Capillary Latest Ref Range: 70 - 99 mg/dL 46 (L) 47 (L) 48 (L) 50 (L) 51 (L) 61 (L) 69 (L) 73 55 (L) 63 (L) 65 (L) 85 61 (L) 68 (L) 83 73   Diabetes history: DM 2 Outpatient Diabetes medications: Glipizide 5 mg Daily, metformin 1000 mg bid Current orders for Inpatient glycemic control:  Novolog Resistant 0-20 units tid + hs scale  A1c 5.2% on 1/6, Hgb 8.3 inaccurate  Inpatient Diabetes Program Recommendations:    Repeated Hypoglycemia. On too much Novolog for renal function.  Consider reducing Novolog "very sensitive" Correction starting at 151 mg/dl.  Thanks,  Christena Deem RN, MSN, BC-ADM Inpatient Diabetes Coordinator Team Pager [phone] (8a-5p)

## 2019-10-07 NOTE — Progress Notes (Signed)
PROGRESS NOTE  Amy Bryant TKW:[id number] DOB: Mar 18, 1956 DOA: 10/06/2019 PCP: Judy Pimple, MD   LOS: 1 day   Brief narrative: As per HPI, Amy Bryant is a 64 y.o. female with medical history significant of atrial fibrillation, diastolic CHF, COPD, diabetes, GERD, hypothyroidism, morbidly obese with history of PE and reactive airway disease who presented with right lower extremity swelling, pain redness and ulcers.  She had a previous presentation with cellulitis and ulcer about 6 months ago where she was admitted and treated.  Patient is leg has always been swollen due to lymphedema chronically but this get worse in the last 3 days.  New also as well as worsening redness.  Some fever.  Patient came in where she is evaluated and treated..  ED Course: Temperature 99.6 blood pressure 100/69 pulse 84 respirate 22 oxygen sat 96% room air.  PT INR 28.72.7.  Lactic acid 1.1.  COVID-19 so far negative.  Sodium 129 potassium of 3.2 chloride 95 CO2 of 23 glucose 44 BUN 57 creatinine 2.40.  White count 15.7 hemoglobin 9.3 platelet 172.  Assessment/Plan:  Principal Problem:   Cellulitis of right lower extremity Active Problems:   Hypothyroidism   Diabetes type 2, controlled (HCC)   Hyperparathyroidism (HCC)   Morbid obesity (HCC)   Obesity hypoventilation syndrome (HCC)   GERD   TOBACCO USE, QUIT   CKD (chronic kidney disease) stage 3, GFR 30-59 ml/min   Cellulitis of right lower leg   Hypoglycemia  Cellulitis of the right lower extremity with infected ulcer in a diabetic patient. on Vanco and cefepime.  Wound care consult.  May need surgical consult if no significant improvement.  Continue close monitoring.  Will discontinue IV fluids.  Patient does have mild leukocytosis.  T-max of 99.9 F.  Diarrhea.  C. difficile and GI pathogen panel has been ordered but still pending.  Imodium if C. difficile negative.  hypothyroidism:.    diabetes mellitus: Today with hypoglycemia. Sliding  scale insulin.  Hemoglobin A1c of 5.2.  Hypoglycemia protocol.  On D5 normal saline.  Decrease the rate today.  GERD: Continue PPI  chronic kidney disease stage III: Continue monitor BMP   morbid obesity, history of COPD and congestive heart failure:  On 4 L of nasal cannula at baseline.   hyponatremia: Na of 132.  Check BMP daily.  Received gentle IV fluid hydration.  Decrease to 50 mL/h due to diarrhea.  Discontinuation in a.m.   hypokalemia: improved.  BMP in a.m.  Right thigh pressure ulcer stage II.  Present on admission.  Consult wound care.  VTE Prophylaxis: Lovenox   Code Status: Full    Family Communication: No contact available  Disposition Plan: Home.  Get PT evaluation tomorrow. Wound care consult  Consultants:  None  Procedures:  None  Antibiotics:  Anti-infectives (From admission, onward)   Start     Dose/Rate Route Frequency Ordered Stop   10/07/19 1430  vancomycin (VANCOREADY) IVPB 750 mg/150 mL     750 mg 150 mL/hr over 60 Minutes Intravenous Every 24 hours 10/06/19 1615     10/06/19 1915  ceFEPIme (MAXIPIME) 2 g in sodium chloride 0.9 % 100 mL IVPB     2 g 200 mL/hr over 30 Minutes Intravenous Every 24 hours 10/06/19 1900     10/06/19 1600  vancomycin (VANCOCIN) 2,500 mg in sodium chloride 0.9 % 500 mL IVPB     2,500 mg 250 mL/hr over 120 Minutes Intravenous  Once 10/06/19 1602 10/06/19 2234  10/06/19 1545  piperacillin-tazobactam (ZOSYN) IVPB 3.375 g     3.375 g 100 mL/hr over 30 Minutes Intravenous  Once 10/06/19 1537 10/06/19 1846      Subjective: Today, patient complains of lower extremity pain and swelling.  Denies dyspnea, shortness of breath, chest pain.  Patient  did have some diarrhea overnight.  At baseline, She does ambulate with the help of walker at home.  Objective: Vitals:   10/07/19 0007 10/07/19 0403  BP: 131/60 109/67  Pulse: 84 84  Resp: 20 18  Temp: 99.6 F (37.6 C) 98.3 F (36.8 C)  SpO2: 97% 100%     Intake/Output Summary (Last 24 hours) at 10/07/2019 0726 Last data filed at 10/07/2019 0300 Gross per 24 hour  Intake 407.87 ml  Output 400 ml  Net 7.87 ml   There were no vitals filed for this visit. There is no height or weight on file to calculate BMI.   Physical Exam: GENERAL: Patient is alert awake and oriented. Not in obvious distress.  Morbidly obese HENT: No scleral pallor or icterus. Pupils equally reactive to light. Oral mucosa is moist NECK: is supple, no palpable thyroid enlargement. CHEST: Clear to auscultation. No crackles or wheezes. Non tender on palpation. Diminished breath sounds bilaterally. CVS: S1 and S2 heard, no murmur. Regular rate and rhythm. No pericardial rub. ABDOMEN: Soft, non-tender, bowel sounds are present. EXTREMITIES: Right lower extremity erythematous tender with stage II thigh ulceration .  Increased local temperature.   CNS: Cranial nerves are intact. No focal motor or sensory deficits. SKIN: stage II thigh pressure ulcer right side on presentation.  Erythema edema and tenderness of the right lower extremity.  Data Review: I have personally reviewed the following laboratory data and studies,  CBC: Recent Labs  Lab 10/06/19 1318 10/07/19 0344  WBC 15.7* 13.0*  HGB 9.3* 8.3*  HCT 29.2* 25.9*  MCV 94.8 95.2  PLT 172 146*   Basic Metabolic Panel: Recent Labs  Lab 10/06/19 1318 10/07/19 0344  NA 129* 132*  K 3.2* 3.9  CL 95* 98  CO2 23 23  GLUCOSE 44* 74  BUN 57* 54*  CREATININE 2.40* 2.25*  CALCIUM 8.8* 8.3*   Liver Function Tests: Recent Labs  Lab 10/06/19 1318 10/07/19 0344  AST 38 38  ALT 17 18  ALKPHOS 93 89  BILITOT 0.7 0.7  PROT 6.6 5.6*  ALBUMIN 2.6* 2.1*   Recent Labs  Lab 10/06/19 1318  LIPASE 30   No results for input(s): AMMONIA in the last 168 hours. Cardiac Enzymes: No results for input(s): CKTOTAL, CKMB, CKMBINDEX, TROPONINI in the last 168 hours. BNP (last 3 results) Recent Labs    04/03/19 1150  10/06/19 1305  BNP 775.4* 152.6*    ProBNP (last 3 results) No results for input(s): PROBNP in the last 8760 hours.  CBG: Recent Labs  Lab 10/06/19 2329 10/07/19 0039 10/07/19 0053 10/07/19 0138 10/07/19 0636  GLUCAP 73 63* 65* 85 61*   Recent Results (from the past 240 hour(s))  Respiratory Panel by RT PCR (Flu A&B, Covid) - Nasopharyngeal Swab     Status: None   Collection Time: 10/06/19  3:49 PM   Specimen: Nasopharyngeal Swab  Result Value Ref Range Status   SARS Coronavirus 2 by RT PCR NEGATIVE NEGATIVE Final    Comment: (NOTE) SARS-CoV-2 target nucleic acids are NOT DETECTED. The SARS-CoV-2 RNA is generally detectable in upper respiratoy specimens during the acute phase of infection. The lowest concentration of SARS-CoV-2 viral copies this  assay can detect is 131 copies/mL. A negative result does not preclude SARS-Cov-2 infection and should not be used as the sole basis for treatment or other patient management decisions. A negative result may occur with  improper specimen collection/handling, submission of specimen other than nasopharyngeal swab, presence of viral mutation(s) within the areas targeted by this assay, and inadequate number of viral copies (<131 copies/mL). A negative result must be combined with clinical observations, patient history, and epidemiological information. The expected result is Negative. Fact Sheet for Patients:  PinkCheek.be Fact Sheet for Healthcare Providers:  GravelBags.it This test is not yet ap proved or cleared by the [place] FDA and  has been authorized for detection and/or diagnosis of SARS-CoV-2 by FDA under an Emergency Use Authorization (EUA). This EUA will remain  in effect (meaning this test can be used) for the duration of the COVID-19 declaration under Section 564(b)(1) of the Act, 21 U.S.C. section 360bbb-3(b)(1), unless the authorization is terminated  or revoked sooner.    Influenza A by PCR NEGATIVE NEGATIVE Final   Influenza B by PCR NEGATIVE NEGATIVE Final    Comment: (NOTE) The Xpert Xpress SARS-CoV-2/FLU/RSV assay is intended as an aid in  the diagnosis of influenza from Nasopharyngeal swab specimens and  should not be used as a sole basis for treatment. Nasal washings and  aspirates are unacceptable for Xpert Xpress SARS-CoV-2/FLU/RSV  testing. Fact Sheet for Patients: PinkCheek.be Fact Sheet for Healthcare Providers: GravelBags.it This test is not yet approved or cleared by the [place] FDA and  has been authorized for detection and/or diagnosis of SARS-CoV-2 by  FDA under an Emergency Use Authorization (EUA). This EUA will remain  in effect (meaning this test can be used) for the duration of the  Covid-19 declaration under Section 564(b)(1) of the Act, 21  U.S.C. section 360bbb-3(b)(1), unless the authorization is  terminated or revoked. Performed at [facility], [place] [address], [place], [place] [address]      Studies: CT ABDOMEN PELVIS WO CONTRAST  Result Date: 10/06/2019 CLINICAL DATA:  Diffuse abdominal pain and diarrhea. Leukocytosis. EXAM: CT ABDOMEN AND PELVIS WITHOUT CONTRAST TECHNIQUE: Multidetector CT imaging of the abdomen and pelvis was performed following the standard protocol without IV contrast. COMPARISON:  03/28/2019 FINDINGS: Lower chest: No acute findings. Hepatobiliary: No mass visualized on this unenhanced exam. Gallstones are seen, however there is no evidence of cholecystitis or biliary dilatation. Pancreas: No mass or inflammatory process visualized on this unenhanced exam. Spleen:  Within normal limits in size. Adrenals/Urinary tract: Stable 1.5 cm fat attenuation right adrenal mass, consistent with benign myelolipoma. 5 mm calculus is seen in the lower pole of the left kidney, without significant change. There is no evidence ureteral  calculi or hydronephrosis . Unremarkable unopacified urinary bladder. Stomach/Bowel: No evidence of obstruction, inflammatory process, or abnormal fluid collections. Diverticulosis is seen mainly involving the descending and sigmoid colon, however there is no evidence of diverticulitis. Vascular/Lymphatic: 13 mm right external iliac lymph node has decreased in size from 17 mm on prior study. Several bilateral inguinal lymph nodes are also stable, largest on the right measuring 16 mm on image 88/2. A 13 mm left paraaortic lymph node is unchanged. No new or or increased lymphadenopathy identified. Aortic atherosclerosis incidentally noted. No evidence of abdominal aortic aneurysm. Reproductive:  No mass or other significant abnormality. Other:  None. Musculoskeletal: No suspicious bone lesions identified. Severe thoracolumbar degenerative spondylosis again noted. IMPRESSION: 1. No acute findings within the abdomen or pelvis.  2. Stable 5 mm left renal calculus. No evidence of ureteral calculi or hydronephrosis. 3. Cholelithiasis. No radiographic evidence of cholecystitis. 4. Colonic diverticulosis. No radiographic evidence of diverticulitis. 5. Stable small right adrenal benign myelolipoma. 6. Stable to slightly decreased size of bilateral inguinal, right external iliac, and left paraaortic lymph nodes. Aortic Atherosclerosis (ICD10-I70.0). Electronically Signed   By: Danae Orleans M.D.   On: 10/06/2019 18:05   DG Chest Portable 1 View  Result Date: 10/06/2019 CLINICAL DATA:  Shortness of breath, fever EXAM: PORTABLE CHEST 1 VIEW COMPARISON:  04/03/2019 FINDINGS: The heart size and mediastinal contours are within normal limits. Both lungs are clear. The visualized skeletal structures are unremarkable. IMPRESSION: No active disease. Electronically Signed   By: Elige Ko   On: 10/06/2019 16:26   CT EXTREMITY LOWER RIGHT WO CONTRAST  Result Date: 10/06/2019 CLINICAL DATA:  Right leg cellulitis. EXAM: CT OF THE  LOWER RIGHT EXTREMITY WITHOUT CONTRAST TECHNIQUE: Multidetector CT imaging of the right lower extremity was performed according to the standard protocol. COMPARISON:  CT right foot dated March 30, 2019. FINDINGS: Bones/Joint/Cartilage No fracture or dislocation. No cortical destruction or osteolysis. Moderate to severe osteoarthritis of the knee. Mild osteoarthritis of the right tibiotalar joint. Severe osteoarthritis of the first MTP joint. Chronic large well corticated ossific density dorsal to the talar head. No joint effusion. Ligaments Ligaments are suboptimally evaluated by CT. Muscles and Tendons Grossly intact. Mild soleus muscle atrophy. Severe fatty atrophy of the intrinsic foot muscles. Soft tissue Diffuse circumferential soft tissue swelling and skin thickening of the lower leg and foot. No fluid collection or hematoma. No subcutaneous emphysema. No soft tissue mass. IMPRESSION: 1. Diffuse circumferential soft tissue swelling and skin thickening of the lower leg and foot, consistent with reported history of cellulitis. No discrete abscess. 2. No acute osseous abnormality. Electronically Signed   By: Obie Dredge M.D.   On: 10/06/2019 19:38    Scheduled Meds:  [START ON 10/08/2019] influenza vac split quadrivalent PF  0.5 mL Intramuscular Tomorrow-1000   insulin aspart  0-20 Units Subcutaneous TID WC   insulin aspart  0-5 Units Subcutaneous QHS    Continuous Infusions:  sodium chloride     ceFEPime (MAXIPIME) IV Stopped (10/06/19 2335)   dextrose 5 % and 0.9% NaCl 100 mL/hr at 10/06/19 1846   vancomycin       Joycelyn Das, MD  [facility] 10/07/2019

## 2019-10-07 NOTE — Progress Notes (Signed)
Pt had been having soft stools, unable to collect stool sample for c.diff PCR, MD notified.

## 2019-10-07 NOTE — Progress Notes (Signed)
PROGRESS NOTE  Amy Bryant Mcever GNF:[id number] DOB: May 09, 1956 DOA: 10/06/2019 PCP: Judy Pimpleower, Marne A, MD   LOS: 1 day   Brief narrative: As per HPI, Amy Bryant Preyer is a 64 y.o. female with medical history significant of atrial fibrillation, diastolic CHF, COPD, diabetes, GERD, hypothyroidism, morbidly obese with history of PE and reactive airway disease who presented with right lower extremity swelling, pain redness and ulcers.  She had a previous presentation with cellulitis and ulcer about 6 months ago where she was admitted and treated.  Patient is leg has always been swollen due to lymphedema chronically but this get worse in the last 3 days.  New also as well as worsening redness.  Some fever.  Patient came in where she is evaluated and treated..  ED Course: Temperature 99.6 blood pressure 100/69 pulse 84 respirate 22 oxygen sat 96% room air.  PT INR 28.72.7.  Lactic acid 1.1.  COVID-19 so far negative.  Sodium 129 potassium of 3.2 chloride 95 CO2 of 23 glucose 44 BUN 57 creatinine 2.40.  White count 15.7 hemoglobin 9.3 platelet 172.  Assessment/Plan:  Principal Problem:   Cellulitis of right lower extremity Active Problems:   Hypothyroidism   Diabetes type 2, controlled (HCC)   Hyperparathyroidism (HCC)   Morbid obesity (HCC)   Obesity hypoventilation syndrome (HCC)   GERD   TOBACCO USE, QUIT   CKD (chronic kidney disease) stage 3, GFR 30-59 ml/min   Cellulitis of right lower leg   Hypoglycemia  Cellulitis of the right lower extremity with infected ulcer in a diabetic patient. on Vanco and cefepime.  Wound care consult. Continue close monitoring.  Will discontinue IV fluids.  Patient does have mild leukocytosis.  T-max of 99.9 F.  Diarrhea.  C. difficile and GI pathogen panel has been ordered but still pending.  Imodium if C. difficile negative.  hypothyroidism:.  Restart Synthroid.  Medication reconciliation not final yet.   diabetes mellitus: Today with hypoglycemia. on high  dose Sliding scale insulin. Decrease sliding scale.  Hemoglobin A1c of 5.2.  Hypoglycemia protocol.  On D5 normal saline.  Decrease the rate today.  GERD: Continue PPI  chronic kidney disease stage III: Continue monitor BMP.   morbid obesity, history of COPD and congestive heart failure:  On 4 L of nasal cannula at baseline.   hyponatremia: Na of 132.  Check BMP daily.  Received gentle IV fluid hydration.  Decrease to 50 mL/h due to diarrhea.  Discontinuation in a.m.   hypokalemia: improved.  BMP in a.m.  Right thigh pressure ulcer stage II.  Present on admission.  Consult wound care.  VTE Prophylaxis: Lovenox   Code Status: Full    Family Communication: Unable to reach the family on the numbers listed.  We will try again tomorrow  Disposition Plan: Home likely in 2 to 3 days.  Get PT evaluation tomorrow. Wound care consult. Med rec pending.   Consultants:  None  Procedures:  None  Antibiotics:  Anti-infectives (From admission, onward)   Start     Dose/Rate Route Frequency Ordered Stop   10/07/19 1430  vancomycin (VANCOREADY) IVPB 750 mg/150 mL     750 mg 150 mL/hr over 60 Minutes Intravenous Every 24 hours 10/06/19 1615     10/06/19 1915  ceFEPIme (MAXIPIME) 2 Bryant in sodium chloride 0.9 % 100 mL IVPB     2 Bryant 200 mL/hr over 30 Minutes Intravenous Every 24 hours 10/06/19 1900     10/06/19 1600  vancomycin (VANCOCIN) 2,500 mg  in sodium chloride 0.9 % 500 mL IVPB     2,500 mg 250 mL/hr over 120 Minutes Intravenous  Once 10/06/19 1602 10/06/19 2234   10/06/19 1545  piperacillin-tazobactam (ZOSYN) IVPB 3.375 Bryant     3.375 Bryant 100 mL/hr over 30 Minutes Intravenous  Once 10/06/19 1537 10/06/19 1846      Subjective: Today, patient complains of lower extremity pain and swelling.  Denies dyspnea, shortness of breath, chest pain.  Patient  did have some diarrhea overnight.  At baseline, She does ambulate with the help of walker at home.  Objective: Vitals:   10/07/19 0840  10/07/19 1333  BP:  (!) 102/54  Pulse:  77  Resp:  18  Temp:  98.6 F (37 C)  SpO2: 100% 98%    Intake/Output Summary (Last 24 hours) at 10/07/2019 1448 Last data filed at 10/07/2019 1200 Gross per 24 hour  Intake 992.33 ml  Output 400 ml  Net 592.33 ml   There were no vitals filed for this visit. There is no height or weight on file to calculate BMI.   Physical Exam: GENERAL: Patient is alert awake and oriented. Not in obvious distress.  Morbidly obese HENT: No scleral pallor or icterus. Pupils equally reactive to light. Oral mucosa is moist NECK: is supple, no palpable thyroid enlargement. CHEST: Clear to auscultation. No crackles or wheezes. Non tender on palpation. Diminished breath sounds bilaterally. CVS: S1 and S2 heard, no murmur. Regular rate and rhythm. No pericardial rub. ABDOMEN: Soft, non-tender, bowel sounds are present. EXTREMITIES: Right lower extremity erythematous tender with stage II thigh ulceration .  Increased local temperature.   CNS: Cranial nerves are intact. No focal motor or sensory deficits. SKIN: stage II thigh pressure ulcer right side on presentation.  Erythema edema and tenderness of the right lower extremity.  Data Review: I have personally reviewed the following laboratory data and studies,  CBC: Recent Labs  Lab 10/06/19 1318 10/07/19 0344  WBC 15.7* 13.0*  HGB 9.3* 8.3*  HCT 29.2* 25.9*  MCV 94.8 95.2  PLT 172 146*   Basic Metabolic Panel: Recent Labs  Lab 10/06/19 1318 10/07/19 0344  NA 129* 132*  K 3.2* 3.9  CL 95* 98  CO2 23 23  GLUCOSE 44* 74  BUN 57* 54*  CREATININE 2.40* 2.25*  CALCIUM 8.8* 8.3*   Liver Function Tests: Recent Labs  Lab 10/06/19 1318 10/07/19 0344  AST 38 38  ALT 17 18  ALKPHOS 93 89  BILITOT 0.7 0.7  PROT 6.6 5.6*  ALBUMIN 2.6* 2.1*   Recent Labs  Lab 10/06/19 1318  LIPASE 30   No results for input(s): AMMONIA in the last 168 hours. Cardiac Enzymes: No results for input(s): CKTOTAL,  CKMB, CKMBINDEX, TROPONINI in the last 168 hours. BNP (last 3 results) Recent Labs    04/03/19 1150 10/06/19 1305  BNP 775.4* 152.6*    ProBNP (last 3 results) No results for input(s): PROBNP in the last 8760 hours.  CBG: Recent Labs  Lab 10/07/19 0138 10/07/19 0636 10/07/19 0731 10/07/19 0831 10/07/19 1129  GLUCAP 85 61* 68* 83 73   Recent Results (from the past 240 hour(s))  Blood culture (routine x 2)     Status: None (Preliminary result)   Collection Time: 10/06/19  3:48 PM   Specimen: BLOOD  Result Value Ref Range Status   Specimen Description BLOOD LEFT ANTECUBITAL  Final   Special Requests   Final    BOTTLES DRAWN AEROBIC AND ANAEROBIC Blood Culture  adequate volume   Culture   Final    NO GROWTH < 24 HOURS Performed at [facility], [address], [place], [place] [address]    Report Status PENDING  Incomplete  Respiratory Panel by RT PCR (Flu A&B, Covid) - Nasopharyngeal Swab     Status: None   Collection Time: 10/06/19  3:49 PM   Specimen: Nasopharyngeal Swab  Result Value Ref Range Status   SARS Coronavirus 2 by RT PCR NEGATIVE NEGATIVE Final    Comment: (NOTE) SARS-CoV-2 target nucleic acids are NOT DETECTED. The SARS-CoV-2 RNA is generally detectable in upper respiratoy specimens during the acute phase of infection. The lowest concentration of SARS-CoV-2 viral copies this assay can detect is 131 copies/mL. A negative result does not preclude SARS-Cov-2 infection and should not be used as the sole basis for treatment or other patient management decisions. A negative result may occur with  improper specimen collection/handling, submission of specimen other than nasopharyngeal swab, presence of viral mutation(s) within the areas targeted by this assay, and inadequate number of viral copies (<131 copies/mL). A negative result must be combined with clinical observations, patient history, and epidemiological information. The expected result is  Negative. Fact Sheet for Patients:  https://www.moore.com/ Fact Sheet for Healthcare Providers:  https://www.young.biz/ This test is not yet ap proved or cleared by the [place] FDA and  has been authorized for detection and/or diagnosis of SARS-CoV-2 by FDA under an Emergency Use Authorization (EUA). This EUA will remain  in effect (meaning this test can be used) for the duration of the COVID-19 declaration under Section 564(b)(1) of the Act, 21 U.S.C. section 360bbb-3(b)(1), unless the authorization is terminated or revoked sooner.    Influenza A by PCR NEGATIVE NEGATIVE Final   Influenza B by PCR NEGATIVE NEGATIVE Final    Comment: (NOTE) The Xpert Xpress SARS-CoV-2/FLU/RSV assay is intended as an aid in  the diagnosis of influenza from Nasopharyngeal swab specimens and  should not be used as a sole basis for treatment. Nasal washings and  aspirates are unacceptable for Xpert Xpress SARS-CoV-2/FLU/RSV  testing. Fact Sheet for Patients: https://www.moore.com/ Fact Sheet for Healthcare Providers: https://www.young.biz/ This test is not yet approved or cleared by the [place] FDA and  has been authorized for detection and/or diagnosis of SARS-CoV-2 by  FDA under an Emergency Use Authorization (EUA). This EUA will remain  in effect (meaning this test can be used) for the duration of the  Covid-19 declaration under Section 564(b)(1) of the Act, 21  U.S.C. section 360bbb-3(b)(1), unless the authorization is  terminated or revoked. Performed at [facility], [address] [place], [place] [address]   Blood culture (routine x 2)     Status: None (Preliminary result)   Collection Time: 10/06/19  7:48 PM   Specimen: BLOOD  Result Value Ref Range Status   Specimen Description BLOOD RIGHT ANTECUBITAL  Final   Special Requests   Final    BOTTLES DRAWN AEROBIC AND ANAEROBIC Blood Culture  adequate volume   Culture   Final    NO GROWTH < 24 HOURS Performed at [facility], [address], [place], [place] [address]    Report Status PENDING  Incomplete     Studies: CT ABDOMEN PELVIS WO CONTRAST  Result Date: 10/06/2019 CLINICAL DATA:  Diffuse abdominal pain and diarrhea. Leukocytosis. EXAM: CT ABDOMEN AND PELVIS WITHOUT CONTRAST TECHNIQUE: Multidetector CT imaging of the abdomen and pelvis was performed following the standard protocol without  IV contrast. COMPARISON:  03/28/2019 FINDINGS: Lower chest: No acute findings. Hepatobiliary: No mass visualized on this unenhanced exam. Gallstones are seen, however there is no evidence of cholecystitis or biliary dilatation. Pancreas: No mass or inflammatory process visualized on this unenhanced exam. Spleen:  Within normal limits in size. Adrenals/Urinary tract: Stable 1.5 cm fat attenuation right adrenal mass, consistent with benign myelolipoma. 5 mm calculus is seen in the lower pole of the left kidney, without significant change. There is no evidence ureteral calculi or hydronephrosis . Unremarkable unopacified urinary bladder. Stomach/Bowel: No evidence of obstruction, inflammatory process, or abnormal fluid collections. Diverticulosis is seen mainly involving the descending and sigmoid colon, however there is no evidence of diverticulitis. Vascular/Lymphatic: 13 mm right external iliac lymph node has decreased in size from 17 mm on prior study. Several bilateral inguinal lymph nodes are also stable, largest on the right measuring 16 mm on image 88/2. A 13 mm left paraaortic lymph node is unchanged. No new or or increased lymphadenopathy identified. Aortic atherosclerosis incidentally noted. No evidence of abdominal aortic aneurysm. Reproductive:  No mass or other significant abnormality. Other:  None. Musculoskeletal: No suspicious bone lesions identified. Severe thoracolumbar degenerative spondylosis again noted. IMPRESSION: 1. No acute  findings within the abdomen or pelvis. 2. Stable 5 mm left renal calculus. No evidence of ureteral calculi or hydronephrosis. 3. Cholelithiasis. No radiographic evidence of cholecystitis. 4. Colonic diverticulosis. No radiographic evidence of diverticulitis. 5. Stable small right adrenal benign myelolipoma. 6. Stable to slightly decreased size of bilateral inguinal, right external iliac, and left paraaortic lymph nodes. Aortic Atherosclerosis (ICD10-I70.0). Electronically Signed   By: Danae Orleans M.D.   On: 10/06/2019 18:05   DG Chest Portable 1 View  Result Date: 10/06/2019 CLINICAL DATA:  Shortness of breath, fever EXAM: PORTABLE CHEST 1 VIEW COMPARISON:  04/03/2019 FINDINGS: The heart size and mediastinal contours are within normal limits. Both lungs are clear. The visualized skeletal structures are unremarkable. IMPRESSION: No active disease. Electronically Signed   By: Elige Ko   On: 10/06/2019 16:26   CT EXTREMITY LOWER RIGHT WO CONTRAST  Result Date: 10/06/2019 CLINICAL DATA:  Right leg cellulitis. EXAM: CT OF THE LOWER RIGHT EXTREMITY WITHOUT CONTRAST TECHNIQUE: Multidetector CT imaging of the right lower extremity was performed according to the standard protocol. COMPARISON:  CT right foot dated March 30, 2019. FINDINGS: Bones/Joint/Cartilage No fracture or dislocation. No cortical destruction or osteolysis. Moderate to severe osteoarthritis of the knee. Mild osteoarthritis of the right tibiotalar joint. Severe osteoarthritis of the first MTP joint. Chronic large well corticated ossific density dorsal to the talar head. No joint effusion. Ligaments Ligaments are suboptimally evaluated by CT. Muscles and Tendons Grossly intact. Mild soleus muscle atrophy. Severe fatty atrophy of the intrinsic foot muscles. Soft tissue Diffuse circumferential soft tissue swelling and skin thickening of the lower leg and foot. No fluid collection or hematoma. No subcutaneous emphysema. No soft tissue mass.  IMPRESSION: 1. Diffuse circumferential soft tissue swelling and skin thickening of the lower leg and foot, consistent with reported history of cellulitis. No discrete abscess. 2. No acute osseous abnormality. Electronically Signed   By: Obie Dredge M.D.   On: 10/06/2019 19:38    Scheduled Meds: . acidophilus  1 capsule Oral Daily  . fluticasone furoate-vilanterol  1 puff Inhalation Daily  . [START ON 10/08/2019] influenza vac split quadrivalent PF  0.5 mL Intramuscular Tomorrow-1000  . insulin aspart  0-20 Units Subcutaneous TID WC  . insulin aspart  0-5 Units Subcutaneous  QHS  . mupirocin ointment   Nasal Daily    Continuous Infusions: . ceFEPime (MAXIPIME) IV Stopped (10/06/19 2335)  . dextrose 5 % and 0.9% NaCl 75 mL/hr at 10/07/19 1200  . vancomycin       Flora Lipps, MD  [facility] 10/07/2019

## 2019-10-07 NOTE — Progress Notes (Signed)
ANTICOAGULATION CONSULT NOTE - Follow Up Consult  Pharmacy Consult for Warfarin Indication: atrial fibrillation and hypercoaguable state, hx PE and DVT  Allergies  Allergen Reactions  . Pseudoephedrine Other (See Comments)    REACTION: tightness in chest  . Augmentin [Amoxicillin-Pot Clavulanate] Itching and Rash    Did it involve swelling of the face/tongue/throat, SOB, or low BP? No Did it involve sudden or severe rash/hives, skin peeling, or any reaction on the inside of your mouth or nose? Yes Did you need to seek medical attention at a hospital or doctor's office? Yes When did it last happen?2019 If all above answers are "NO", may proceed with cephalosporin use.   . Latex Itching and Rash    Patient Measurements: Height: 5\' 5"  (165.1 cm) Weight: (!) 313 lb 0.9 oz (142 kg) IBW/kg (Calculated) : 57  Vital Signs: Temp: 98.6 F (37 C) (01/06 1333) Temp Source: Oral (01/06 1333) BP: 102/54 (01/06 1333) Pulse Rate: 77 (01/06 1333)  Labs: Recent Labs    10/06/19 1318 10/06/19 1948 10/07/19 0344 10/07/19 1513  HGB 9.3*  --  8.3*  --   HCT 29.2*  --  25.9*  --   PLT 172  --  146*  --   LABPROT  --  28.7*  --  32.7*  INR  --  2.7*  --  3.2*  CREATININE 2.40*  --  2.25*  --     Estimated Creatinine Clearance: 36.3 mL/min (A) (by C-G formula based on SCr of 2.25 mg/dL (H)).  Assessment:  64 yr old female on warfarin PTA for hypercoagulable state, hx PE and DVT and atrial fibrillation.   Pharmacy asked to manage warfarin dosing.       INR 2.7 last night and 3.2 today.  Last warfarin dose 1/4    PTA regimen:  5 mg MWF, 2.5 mg TTSS   Goal of Therapy:  INR 2-3 Monitor platelets by anticoagulation protocol: Yes   Plan:   No Warfarin tonight.  Daily PT/INR.  77, RPh Phone: [phone] 10/07/2019,3:58 PM

## 2019-10-07 NOTE — Plan of Care (Signed)

## 2019-10-07 NOTE — Progress Notes (Signed)
Blood sugar @ 0636 = 61  2 cups of juice given. [facility] on call notified.  Blood Sugar @ 0731 = 68  2 cups of apple juice & ice cream given to patient and breakfast ordered.

## 2019-10-07 NOTE — Consult Note (Signed)
WOC Nurse Consult Note: Reason for Consult: Partial thickness tissue loss to right lateral thigh, consistent with trauma.  Some purulence noted.  Cellulitis to right lower leg with erythema and induration noted.  Wound type:trauma to lateral thigh and infectious to lower leg Pressure Injury POA: NA Measurement: right thigh:   Wound bed:4 cm x 2x2 cm x 0.1 cm  Drainage (amount, consistency, odor) minimal serosanguinous  Periwound:right lower leg with erythema and induration Dressing procedure/placement/frequency: Cleanse right thigh wound with NS and pat dry.  Apply mupirocin ointment to wound bed.  Cover with foam.  Apply daily.  Change foam every three days and PRN soilage.  Will not follow at this time.  Please re-consult if needed.  Maple Hudson MSN, RN, FNP-BC CWON Wound, Ostomy, Continence Nurse Pager [phone]

## 2019-10-08 LAB — PROTIME-INR
INR: 3.5 — ABNORMAL HIGH (ref 0.8–1.2)
Prothrombin Time: 35.4 seconds — ABNORMAL HIGH (ref 11.4–15.2)

## 2019-10-08 LAB — BASIC METABOLIC PANEL
Anion gap: 10 (ref 5–15)
BUN: 49 mg/dL — ABNORMAL HIGH (ref 8–23)
CO2: 25 mmol/L (ref 22–32)
Calcium: 8.5 mg/dL — ABNORMAL LOW (ref 8.9–10.3)
Chloride: 101 mmol/L (ref 98–111)
Creatinine, Ser: 1.88 mg/dL — ABNORMAL HIGH (ref 0.44–1.00)
GFR calc Af Amer: 32 mL/min — ABNORMAL LOW (ref >60)
GFR calc non Af Amer: 28 mL/min — ABNORMAL LOW (ref >60)
Glucose, Bld: 83 mg/dL (ref 70–99)
Potassium: 4.1 mmol/L (ref 3.5–5.1)
Sodium: 136 mmol/L (ref 135–145)

## 2019-10-08 LAB — CBC
HCT: 25.4 % — ABNORMAL LOW (ref 36.0–46.0)
Hemoglobin: 7.7 g/dL — ABNORMAL LOW (ref 12.0–15.0)
MCH: 29.5 pg (ref 26.0–34.0)
MCHC: 30.3 g/dL (ref 30.0–36.0)
MCV: 97.3 fL (ref 80.0–100.0)
Platelets: 159 10*3/uL (ref 150–400)
RBC: 2.61 MIL/uL — ABNORMAL LOW (ref 3.87–5.11)
RDW: 13.3 % (ref 11.5–15.5)
WBC: 10.2 10*3/uL (ref 4.0–10.5)
nRBC: 0 % (ref 0.0–0.2)

## 2019-10-08 LAB — GLUCOSE, CAPILLARY
Glucose-Capillary: 73 mg/dL (ref 70–99)
Glucose-Capillary: 89 mg/dL (ref 70–99)
Glucose-Capillary: 113 mg/dL — ABNORMAL HIGH (ref 70–99)
Glucose-Capillary: 96 mg/dL (ref 70–99)

## 2019-10-08 LAB — MAGNESIUM: Magnesium: 1.9 mg/dL (ref 1.7–2.4)

## 2019-10-08 MED ORDER — VANCOMYCIN HCL 1250 MG/250ML IV SOLN
1250.0000 mg | INTRAVENOUS | Status: DC
  Administered 2019-10-08: 15:00:00 1250 mg via INTRAVENOUS
  Filled 2019-10-08 (×2): qty 250

## 2019-10-08 MED ORDER — SODIUM CHLORIDE 0.9 % IV SOLN
2.0000 g | Freq: Two times a day (BID) | INTRAVENOUS | Status: DC
  Administered 2019-10-09 (×2): 2 g via INTRAVENOUS
  Filled 2019-10-08 (×2): qty 2

## 2019-10-08 NOTE — Evaluation (Signed)
Physical Therapy Evaluation Patient Details Name: Amy Bryant MRN: [id number] DOB: 1955/11/19 Today's Date: 10/08/2019   History of Present Illness  Pt is a 64 y.o. female with medical history significant of atrial fibrillation, diastolic CHF, COPD, diabetes, GERD, hypothyroidism, morbidly obese with history of PE and reactive airway disease who presented with right lower extremity swelling, pain redness and ulcers.  Admitted with R LE cellulitis.  Clinical Impression  Pt admitted with above diagnosis. Pt was able to complete bed mobility, transfers, and ambulate 3' with RW today with min A.  She was limited by R LE pain.  Pt with necessary DME (including w/c) at home and 24 hr support.   Pt currently with functional limitations due to the deficits listed below (see PT Problem List). Pt will benefit from skilled PT to increase their independence and safety with mobility to allow discharge to the venue listed below.       Follow Up Recommendations Home health PT    Equipment Recommendations  None recommended by PT;Other (comment)(pt has DME)    Recommendations for Other Services       Precautions / Restrictions Precautions Precautions: Fall Restrictions Weight Bearing Restrictions: No      Mobility  Bed Mobility Overal bed mobility: Needs Assistance Bed Mobility: Rolling Rolling: Min assist   Supine to sit: Min assist;HOB elevated Sit to supine: Min assist;HOB elevated   General bed mobility comments: min A for R leg and use of bed rails (sleeps in recliner at home); multiple rolls for ADLs  Transfers Overall transfer level: Needs assistance Equipment used: Rolling walker (2 wheeled) Transfers: Sit to/from Stand Sit to Stand: Min guard         General transfer comment: cues for safe hand placement  Ambulation/Gait Ambulation/Gait assistance: Min assist Gait Distance (Feet): 3 Feet Assistive device: Rolling walker (2 wheeled) Gait Pattern/deviations: Wide base of  support;Decreased stride length Gait velocity: decreased   General Gait Details: side steps toward HOB; limited by R  LE pain  Stairs            Wheelchair Mobility    Modified Rankin (Stroke Patients Only)       Balance Overall balance assessment: Needs assistance Sitting-balance support: Bilateral upper extremity supported;Feet supported Sitting balance-Leahy Scale: Good Sitting balance - Comments: Sat EOB for 8 mins with SBA   Standing balance support: Bilateral upper extremity supported;During functional activity Standing balance-Leahy Scale: Fair                               Pertinent Vitals/Pain Pain Assessment: 0-10 Pain Score: 5  Pain Location: R leg Pain Descriptors / Indicators: Discomfort Pain Intervention(s): Repositioned;Limited activity within patient's tolerance    Home Living Family/patient expects to be discharged to:: Private residence Living Arrangements: Children;Other relatives Available Help at Discharge: Family;Available 24 hours/day Type of Home: House Home Access: Ramped entrance     Home Layout: One level Home Equipment: Walker - 2 wheels;Wheelchair - manual      Prior Function Level of Independence: Needs assistance   Gait / Transfers Assistance Needed: Walks in house with RW  ADL's / Homemaking Assistance Needed: reports independent toileting, sponge bathing at sink, and dressing without assist   Comments: on 4L of oxgyen PTA, sleeps in recliner; has HH RN 1 x week     Hand Dominance        Extremity/Trunk Assessment   Upper Extremity Assessment Upper Extremity  Assessment: Overall WFL for tasks assessed    Lower Extremity Assessment Lower Extremity Assessment: LLE deficits/detail;RLE deficits/detail RLE Deficits / Details: ROM WFL; MMT hip2/5, knee 3/5, ankle 3/5 (not further tested, limited by pain) LLE Deficits / Details: ROM WFL; MMT knee and ankle 5/5, hip 4/5    Cervical / Trunk  Assessment Cervical / Trunk Assessment: Normal  Communication   Communication: No difficulties  Cognition Arousal/Alertness: Awake/alert Behavior During Therapy: WFL for tasks assessed/performed Overall Cognitive Status: Within Functional Limits for tasks assessed                                        General Comments General comments (skin integrity, edema, etc.): VSS on 4 LPM O2;  Pt with 3/4 dyspnea with standing but O2 sats 100% and HR 80-90 bpm    Exercises     Assessment/Plan    PT Assessment Patient needs continued PT services  PT Problem List Decreased strength;Decreased mobility;Decreased safety awareness;Decreased activity tolerance;Cardiopulmonary status limiting activity;Decreased balance;Decreased knowledge of use of DME;Pain       PT Treatment Interventions DME instruction;Therapeutic activities;Gait training;Therapeutic exercise;Patient/family education;Balance training;Functional mobility training    PT Goals (Current goals can be found in the Care Plan section)  Acute Rehab PT Goals Patient Stated Goal: return home PT Goal Formulation: With patient Time For Goal Achievement: 10/22/19 Potential to Achieve Goals: Good    Frequency Min 3X/week   Barriers to discharge        Co-evaluation               AM-PAC PT "6 Clicks" Mobility  Outcome Measure Help needed turning from your back to your side while in a flat bed without using bedrails?: A Little Help needed moving from lying on your back to sitting on the side of a flat bed without using bedrails?: A Little Help needed moving to and from a bed to a chair (including a wheelchair)?: A Little Help needed standing up from a chair using your arms (e.g., wheelchair or bedside chair)?: A Little Help needed to walk in hospital room?: A Little Help needed climbing 3-5 steps with a railing? : Total 6 Click Score: 16    End of Session Equipment Utilized During Treatment: Gait  belt;Oxygen Activity Tolerance: Patient tolerated treatment well Patient left: in bed;with call bell/phone within reach;with bed alarm set Nurse Communication: Mobility status PT Visit Diagnosis: Unsteadiness on feet (R26.81);Muscle weakness (generalized) (M62.81)    Time: 8841-6606 PT Time Calculation (min) (ACUTE ONLY): 38 min   Charges:   PT Evaluation $PT Eval Low Complexity: 1 Low PT Treatments $Therapeutic Activity: 8-22 mins        Maggie Font, PT Acute Rehab Services Pager [phone] [facility] [phone]   Karlton Lemon 10/08/2019, 2:05 PM

## 2019-10-08 NOTE — Plan of Care (Signed)
  Problem: Education: Goal: Knowledge of General Education information will improve Description: Including pain rating scale, medication(s)/side effects and non-pharmacologic comfort measures Outcome: Adequate for Discharge   

## 2019-10-08 NOTE — Progress Notes (Signed)
Pharmacy Antibiotic Note  Amy Bryant is a 64 y.o. female admitted on 10/06/2019 with cellulitis. Patient presented with swelling, redness, and weeping to RLE. She was started on IV antibiotics (Vancomycin and Cefepime) per pharmacy protocol.  1/7 Patient weight confirmed at 144kg, creatinine down to 1.88 today.  She received a 2500mg  loading dose on 1/5 and was to start 750mg  dose today.  Will bump her dose to 1250mg  q24hr tonight instead.  Continue Cefepime but change interval to every 12hours to ensure appropriate coverage.  Plan: Cefepime 2 gm IV q12hr  Vancomycin 1250 mg daily   - Goal AUC 400-550.   - Expected AUC: 459   - SCr used: 1.88    - Weight used: 144 kg   Monitor clinical improvement, renal function (baseline ~1) and obtain vancomycin levels as needed.  Temp (24hrs), Avg:98.2 F (36.8 C), Min:97.8 F (36.6 C), Max:98.6 F (37 C)  Recent Labs  Lab 10/06/19 1318 10/06/19 1800 10/07/19 0344 10/08/19 0419  WBC 15.7*  --  13.0* 10.2  CREATININE 2.40*  --  2.25* 1.88*  LATICACIDVEN  --  1.1 1.2  --     Estimated Creatinine Clearance: 43.4 mL/min (A) (by C-G formula based on SCr of 1.88 mg/dL (H)).    Allergies  Allergen Reactions  . Pseudoephedrine Other (See Comments)    REACTION: tightness in chest  . Augmentin [Amoxicillin-Pot Clavulanate] Itching and Rash    Did it involve swelling of the face/tongue/throat, SOB, or low BP? No Did it involve sudden or severe rash/hives, skin peeling, or any reaction on the inside of your mouth or nose? Yes Did you need to seek medical attention at a hospital or doctor's office? Yes When did it last happen?2019 If all above answers are "NO", may proceed with cephalosporin use.   . Latex Itching and Rash    Antimicrobials this admission: 1/5 zosyn x1 dose  1/5 Cefepime >>  1/5 vancomycin  >>   Dose adjustments this admission: none 1/7 Change Vancomycin to 1250mg  q24 for improved renal function  Microbiology  results: 1/5 BCx: sent 1/5 C Diff PCR: sent 1/5 GI pathogen panel: sent    Thank you for allowing pharmacy to be a part of this patient's care.  12/06/19, PharmD., MS Clinical Pharmacist  10/08/2019 12:32 PM

## 2019-10-08 NOTE — Progress Notes (Signed)
ANTICOAGULATION CONSULT NOTE - Follow Up Consult  Pharmacy Consult for Warfarin Indication: atrial fibrillation and hypercoaguable state, hx PE and DVT  Allergies  Allergen Reactions  . Pseudoephedrine Other (See Comments)    REACTION: tightness in chest  . Augmentin [Amoxicillin-Pot Clavulanate] Itching and Rash    Did it involve swelling of the face/tongue/throat, SOB, or low BP? No Did it involve sudden or severe rash/hives, skin peeling, or any reaction on the inside of your mouth or nose? Yes Did you need to seek medical attention at a hospital or doctor's office? Yes When did it last happen?2019 If all above answers are "NO", may proceed with cephalosporin use.   . Latex Itching and Rash   Patient Measurements: Height: 5\' 5"  (165.1 cm) Weight: (!) 313 lb 0.9 oz (142 kg) IBW/kg (Calculated) : 57  Vital Signs: Temp: 98.1 F (36.7 C) (01/07 0805) Temp Source: Oral (01/07 0805) BP: 104/58 (01/07 1201) Pulse Rate: 69 (01/07 0805)  Labs: Recent Labs    10/06/19 1318 10/06/19 1948 10/07/19 0344 10/07/19 1513 10/08/19 0419  HGB 9.3*  --  8.3*  --  7.7*  HCT 29.2*  --  25.9*  --  25.4*  PLT 172  --  146*  --  159  LABPROT  --  28.7*  --  32.7* 35.4*  INR  --  2.7*  --  3.2* 3.5*  CREATININE 2.40*  --  2.25*  --  1.88*    Estimated Creatinine Clearance: 43.4 mL/min (A) (by C-G formula based on SCr of 1.88 mg/dL (H)).  Assessment:  64 yr old female on warfarin PTA for hypercoagulable state, hx PE and DVT and atrial fibrillation.   Pharmacy asked to manage warfarin dosing.       1/7 -  INR 3.5 today so continuing to trend up - may be lack of oral intake and antibiotics that is impacting level.  Last warfarin dose was 10/05/2019.  Hemoglobin trending down some - no acute bleeding noted.    PTA regimen:  5 mg MWF, 2.5 mg TTSS   Goal of Therapy:  INR 2-3 Monitor platelets by anticoagulation protocol: Yes   Plan:   No Warfarin tonight.  Daily PT/INR.  12/03/2019, PharmD., MS Clinical Pharmacist Thank you for allowing pharmacy to be part of this patients care team.  10/08/2019,12:43 PM

## 2019-10-08 NOTE — Progress Notes (Addendum)
PROGRESS NOTE  [name]:[id number] DOB: August 11, 1956 DOA: 10/06/2019 PCP: Judy Pimple, MD   LOS: 2 days   Brief narrative: As per HPI, Amy Bryant is a 64 y.o. female with medical history significant of atrial fibrillation, diastolic CHF, COPD, diabetes, GERD, hypothyroidism, morbidly obese with history of PE and reactive airway disease presented to the hospital with right lower extremity swelling, pain redness and ulcers. Patient had a previous history of  cellulitis and ulcer about 6 months ago where she was admitted and treated.  Patient states that her leg has always been swollen due to lymphedema chronically but this get worse in the last 3 days.  ED Course: Temperature 99.6 blood pressure 100/69 pulse 84 respirate 22 oxygen sat 96% room air.  PT INR 28.72.7.  Lactic acid 1.1.  COVID-19 so far negative.  Sodium 129 potassium of 3.2 chloride 95 CO2 of 23 glucose 44 BUN 57 creatinine 2.40.  White count 15.7 hemoglobin 9.3 platelet 172.  Assessment/Plan:  Principal Problem:   Cellulitis of right lower extremity Active Problems:   Hypothyroidism   Diabetes type 2, controlled (HCC)   Hyperparathyroidism (HCC)   Morbid obesity (HCC)   Obesity hypoventilation syndrome (HCC)   GERD   TOBACCO USE, QUIT   CKD (chronic kidney disease) stage 3, GFR 30-59 ml/min   Cellulitis of right lower leg   Hypoglycemia  Cellulitis of the right lower extremity in a diabetic patient.  Appears to be slightly improved today.  On Vanco and cefepime.  Wound care has seen the patient and appreciate their recommendations. off IV fluids. Mild leukocytosis- trending down..  T-max of 99.9 F.  Continue IV antibiotics for now.  Diarrhea.  No further bowel movements and had formed stool x1.  C. difficile and GI pathogen panel has been ordered but could not be sent.  Will discontinue test and isolation. . hypothyroidism:.   Continue Synthroid  Normocytic anemia likely secondary to anemia of chronic  kidney disease.  Hemoglobin of 7.7 today.   diabetes mellitus: Had hypoglycemia yesterday. On  sliding scale.  Hemoglobin A1c of 5.2.  On D5 normal saline.  We will discontinue IV fluid today.  GERD: Continue PPI  Acute kidney injury on chronic kidney disease stage IIIb: Continue monitor BMP. Creatinine today is 1.8 from 2.4.  Discontinue IV fluids today   Morbid obesity, history of COPD and chronic systolic congestive heart failure, cor pulmonale, obesity hypoventilation syndrome.:  On 3 L of nasal cannula at baseline.  She takes her Lasix 80 mg twice a day at home.  Currently on hold.  Will discontinue IV fluids today.  Consider restarting Lasix by tomorrow if renal function continues to improve..   hyponatremia: improved. Na of 136.  Check BMP daily.  Discontinue IV fluids.   hypokalemia: improved.  Potassium of 4.1.  History of atrial fibrillation.  Currently rate controlled.  Continue anticoagulation.  Right thigh pressure ulcer stage II.  Present on admission.  Continue wound care.  VTE Prophylaxis: Lovenox   Code Status: Full    Family Communication: I was unable to reach the patient's son on the home and mobile phone listed in the computer.  Disposition Plan: Home likely in 1 to 2 days.  Continue IV antibiotics for now.Clydene Pugh PT evaluation  Consultants:  None  Procedures:  None  Antibiotics:  Anti-infectives (From admission, onward)   Start     Dose/Rate Route Frequency Ordered Stop   10/07/19 1430  vancomycin (VANCOREADY) IVPB  750 mg/150 mL     750 mg 150 mL/hr over 60 Minutes Intravenous Every 24 hours 10/06/19 1615     10/06/19 1915  ceFEPIme (MAXIPIME) 2 g in sodium chloride 0.9 % 100 mL IVPB     2 g 200 mL/hr over 30 Minutes Intravenous Every 24 hours 10/06/19 1900     10/06/19 1600  vancomycin (VANCOCIN) 2,500 mg in sodium chloride 0.9 % 500 mL IVPB     2,500 mg 250 mL/hr over 120 Minutes Intravenous  Once 10/06/19 1602 10/06/19 2234   10/06/19 1545   piperacillin-tazobactam (ZOSYN) IVPB 3.375 g     3.375 g 100 mL/hr over 30 Minutes Intravenous  Once 10/06/19 1537 10/06/19 1846     Subjective: Today, patient feels okay.  Denies overt shortness of breath cough fever.  Complains of mild leg pain.  Objective: Vitals:   10/07/19 2120 10/08/19 0432  BP: 110/60 (!) 122/59  Pulse: 72 69  Resp: 18 18  Temp: 98.4 F (36.9 C) 97.8 F (36.6 C)  SpO2: 96% 100%    Intake/Output Summary (Last 24 hours) at 10/08/2019 0752 Last data filed at 10/08/2019 0600 Gross per 24 hour  Intake 2542.91 ml  Output --  Net 2542.91 ml   Filed Weights   10/07/19 1542  Weight: (!) 142 kg   Body mass index is 52.09 kg/m.   Physical Exam:  GENERAL: Patient is alert awake and oriented. Not in obvious distress.  Morbidly obese HENT: No scleral pallor or icterus. Pupils equally reactive to light. Oral mucosa is moist NECK: is supple, no palpable thyroid enlargement. CHEST: Clear to auscultation. No crackles or wheezes. Non tender on palpation. Diminished breath sounds bilaterally. CVS: S1 and S2 heard, no murmur. Regular rate and rhythm. No pericardial rub. ABDOMEN: Soft, non-tender, bowel sounds are present. EXTREMITIES: Right lower extremity erythematous tender with stage II thigh ulceration .  Increased local temperature.  Erythema has improved from yesterday. CNS: Cranial nerves are intact. No focal motor or sensory deficits. SKIN: stage II thigh pressure ulcer right side on presentation.  Erythema, edema and tenderness of the right lower extremity.  Data Review: I have personally reviewed the following laboratory data and studies,  CBC: Recent Labs  Lab 10/06/19 1318 10/07/19 0344 10/08/19 0419  WBC 15.7* 13.0* 10.2  HGB 9.3* 8.3* 7.7*  HCT 29.2* 25.9* 25.4*  MCV 94.8 95.2 97.3  PLT 172 146* 159   Basic Metabolic Panel: Recent Labs  Lab 10/06/19 1318 10/07/19 0344 10/08/19 0419  NA 129* 132* 136  K 3.2* 3.9 4.1  CL 95* 98 101  CO2  23 23 25   GLUCOSE 44* 74 83  BUN 57* 54* 49*  CREATININE 2.40* 2.25* 1.88*  CALCIUM 8.8* 8.3* 8.5*  MG  --   --  1.9   Liver Function Tests: Recent Labs  Lab 10/06/19 1318 10/07/19 0344  AST 38 38  ALT 17 18  ALKPHOS 93 89  BILITOT 0.7 0.7  PROT 6.6 5.6*  ALBUMIN 2.6* 2.1*   Recent Labs  Lab 10/06/19 1318  LIPASE 30   No results for input(s): AMMONIA in the last 168 hours. Cardiac Enzymes: No results for input(s): CKTOTAL, CKMB, CKMBINDEX, TROPONINI in the last 168 hours. BNP (last 3 results) Recent Labs    04/03/19 1150 10/06/19 1305  BNP 775.4* 152.6*    ProBNP (last 3 results) No results for input(s): PROBNP in the last 8760 hours.  CBG: Recent Labs  Lab 10/07/19 0831 10/07/19 1129 10/07/19  1702 10/07/19 2211 10/08/19 0643  GLUCAP 83 73 84 92 73   Recent Results (from the past 240 hour(s))  Blood culture (routine x 2)     Status: None (Preliminary result)   Collection Time: 10/06/19  3:48 PM   Specimen: BLOOD  Result Value Ref Range Status   Specimen Description BLOOD LEFT ANTECUBITAL  Final   Special Requests   Final    BOTTLES DRAWN AEROBIC AND ANAEROBIC Blood Culture adequate volume   Culture   Final    NO GROWTH < 24 HOURS Performed at [facility], [place] [address], [place], [place] [address]    Report Status PENDING  Incomplete  Respiratory Panel by RT PCR (Flu A&B, Covid) - Nasopharyngeal Swab     Status: None   Collection Time: 10/06/19  3:49 PM   Specimen: Nasopharyngeal Swab  Result Value Ref Range Status   SARS Coronavirus 2 by RT PCR NEGATIVE NEGATIVE Final    Comment: (NOTE) SARS-CoV-2 target nucleic acids are NOT DETECTED. The SARS-CoV-2 RNA is generally detectable in upper respiratoy specimens during the acute phase of infection. The lowest concentration of SARS-CoV-2 viral copies this assay can detect is 131 copies/mL. A negative result does not preclude SARS-Cov-2 infection and should not be used as the sole basis for  treatment or other patient management decisions. A negative result may occur with  improper specimen collection/handling, submission of specimen other than nasopharyngeal swab, presence of viral mutation(s) within the areas targeted by this assay, and inadequate number of viral copies (<131 copies/mL). A negative result must be combined with clinical observations, patient history, and epidemiological information. The expected result is Negative. Fact Sheet for Patients:  PinkCheek.be Fact Sheet for Healthcare Providers:  GravelBags.it This test is not yet ap proved or cleared by the [place] FDA and  has been authorized for detection and/or diagnosis of SARS-CoV-2 by FDA under an Emergency Use Authorization (EUA). This EUA will remain  in effect (meaning this test can be used) for the duration of the COVID-19 declaration under Section 564(b)(1) of the Act, 21 U.S.C. section 360bbb-3(b)(1), unless the authorization is terminated or revoked sooner.    Influenza A by PCR NEGATIVE NEGATIVE Final   Influenza B by PCR NEGATIVE NEGATIVE Final    Comment: (NOTE) The Xpert Xpress SARS-CoV-2/FLU/RSV assay is intended as an aid in  the diagnosis of influenza from Nasopharyngeal swab specimens and  should not be used as a sole basis for treatment. Nasal washings and  aspirates are unacceptable for Xpert Xpress SARS-CoV-2/FLU/RSV  testing. Fact Sheet for Patients: PinkCheek.be Fact Sheet for Healthcare Providers: GravelBags.it This test is not yet approved or cleared by the [place] FDA and  has been authorized for detection and/or diagnosis of SARS-CoV-2 by  FDA under an Emergency Use Authorization (EUA). This EUA will remain  in effect (meaning this test can be used) for the duration of the  Covid-19 declaration under Section 564(b)(1) of the Act, 21  U.S.C. section  360bbb-3(b)(1), unless the authorization is  terminated or revoked. Performed at [facility], [place] [address], [place],  [address]   Blood culture (routine x 2)     Status: None (Preliminary result)   Collection Time: 10/06/19  7:48 PM   Specimen: BLOOD  Result Value Ref Range Status   Specimen Description BLOOD RIGHT ANTECUBITAL  Final   Special Requests   Final    BOTTLES DRAWN AEROBIC AND ANAEROBIC Blood Culture adequate volume  Culture   Final    NO GROWTH < 24 HOURS Performed at [facility], [address], [place], [place] [address]    Report Status PENDING  Incomplete     Studies: CT ABDOMEN PELVIS WO CONTRAST  Result Date: 10/06/2019 CLINICAL DATA:  Diffuse abdominal pain and diarrhea. Leukocytosis. EXAM: CT ABDOMEN AND PELVIS WITHOUT CONTRAST TECHNIQUE: Multidetector CT imaging of the abdomen and pelvis was performed following the standard protocol without IV contrast. COMPARISON:  03/28/2019 FINDINGS: Lower chest: No acute findings. Hepatobiliary: No mass visualized on this unenhanced exam. Gallstones are seen, however there is no evidence of cholecystitis or biliary dilatation. Pancreas: No mass or inflammatory process visualized on this unenhanced exam. Spleen:  Within normal limits in size. Adrenals/Urinary tract: Stable 1.5 cm fat attenuation right adrenal mass, consistent with benign myelolipoma. 5 mm calculus is seen in the lower pole of the left kidney, without significant change. There is no evidence ureteral calculi or hydronephrosis . Unremarkable unopacified urinary bladder. Stomach/Bowel: No evidence of obstruction, inflammatory process, or abnormal fluid collections. Diverticulosis is seen mainly involving the descending and sigmoid colon, however there is no evidence of diverticulitis. Vascular/Lymphatic: 13 mm right external iliac lymph node has decreased in size from 17 mm on prior study. Several bilateral inguinal lymph nodes are also stable,  largest on the right measuring 16 mm on image 88/2. A 13 mm left paraaortic lymph node is unchanged. No new or or increased lymphadenopathy identified. Aortic atherosclerosis incidentally noted. No evidence of abdominal aortic aneurysm. Reproductive:  No mass or other significant abnormality. Other:  None. Musculoskeletal: No suspicious bone lesions identified. Severe thoracolumbar degenerative spondylosis again noted. IMPRESSION: 1. No acute findings within the abdomen or pelvis. 2. Stable 5 mm left renal calculus. No evidence of ureteral calculi or hydronephrosis. 3. Cholelithiasis. No radiographic evidence of cholecystitis. 4. Colonic diverticulosis. No radiographic evidence of diverticulitis. 5. Stable small right adrenal benign myelolipoma. 6. Stable to slightly decreased size of bilateral inguinal, right external iliac, and left paraaortic lymph nodes. Aortic Atherosclerosis (ICD10-I70.0). Electronically Signed   By: Danae Orleans M.D.   On: 10/06/2019 18:05   DG Chest Portable 1 View  Result Date: 10/06/2019 CLINICAL DATA:  Shortness of breath, fever EXAM: PORTABLE CHEST 1 VIEW COMPARISON:  04/03/2019 FINDINGS: The heart size and mediastinal contours are within normal limits. Both lungs are clear. The visualized skeletal structures are unremarkable. IMPRESSION: No active disease. Electronically Signed   By: Elige Ko   On: 10/06/2019 16:26   CT EXTREMITY LOWER RIGHT WO CONTRAST  Result Date: 10/06/2019 CLINICAL DATA:  Right leg cellulitis. EXAM: CT OF THE LOWER RIGHT EXTREMITY WITHOUT CONTRAST TECHNIQUE: Multidetector CT imaging of the right lower extremity was performed according to the standard protocol. COMPARISON:  CT right foot dated March 30, 2019. FINDINGS: Bones/Joint/Cartilage No fracture or dislocation. No cortical destruction or osteolysis. Moderate to severe osteoarthritis of the knee. Mild osteoarthritis of the right tibiotalar joint. Severe osteoarthritis of the first MTP joint. Chronic  large well corticated ossific density dorsal to the talar head. No joint effusion. Ligaments Ligaments are suboptimally evaluated by CT. Muscles and Tendons Grossly intact. Mild soleus muscle atrophy. Severe fatty atrophy of the intrinsic foot muscles. Soft tissue Diffuse circumferential soft tissue swelling and skin thickening of the lower leg and foot. No fluid collection or hematoma. No subcutaneous emphysema. No soft tissue mass. IMPRESSION: 1. Diffuse circumferential soft tissue swelling and skin thickening of the lower leg and foot, consistent with reported  history of cellulitis. No discrete abscess. 2. No acute osseous abnormality. Electronically Signed   By: Obie Dredge M.D.   On: 10/06/2019 19:38    Scheduled Meds:  acidophilus  1 capsule Oral Daily   atorvastatin  10 mg Oral q1800   cholecalciferol  4,000 Units Oral Daily   diltiazem  120 mg Oral Daily   diphenhydrAMINE  25 mg Oral Once   FLUoxetine  40 mg Oral Daily   fluticasone furoate-vilanterol  1 puff Inhalation Daily   influenza vac split quadrivalent PF  0.5 mL Intramuscular Tomorrow-1000   insulin aspart  0-5 Units Subcutaneous QHS   insulin aspart  0-9 Units Subcutaneous TID WC   levothyroxine  175 mcg Oral QAC breakfast   mupirocin ointment   Nasal Daily   pantoprazole  40 mg Oral Q0600   potassium chloride SA  40 mEq Oral Daily   Warfarin - Pharmacist Dosing Inpatient   Does not apply q1800    Continuous Infusions:  ceFEPime (MAXIPIME) IV 2 g (10/07/19 2024)   dextrose 5 % and 0.9% NaCl 75 mL/hr at 10/08/19 0600   vancomycin Stopped (10/07/19 1929)     Joycelyn Das, MD  [facility] 10/08/2019

## 2019-10-08 NOTE — Progress Notes (Signed)
Wound care as ordered to the right upper and lower leg.  Right leg remain red and edematous with dried skin and blistered areas.  The foam dressing do not maintain on the legs but were changed as ordered.  Nonhealing wound noted to the back of the right upper leg, wound noted to the inner thigh which is draining small amount of serous drainage and a wound noted to the back of the right calf.  Right leg remains elevated on a pillow.  The patient has slept most of the day.

## 2019-10-09 DIAGNOSIS — Z23 Encounter for immunization: Secondary | ICD-10-CM | POA: Diagnosis not present

## 2019-10-09 LAB — GLUCOSE, CAPILLARY
Glucose-Capillary: 68 mg/dL — ABNORMAL LOW (ref 70–99)
Glucose-Capillary: 109 mg/dL — ABNORMAL HIGH (ref 70–99)

## 2019-10-09 LAB — BASIC METABOLIC PANEL
Anion gap: 7 (ref 5–15)
BUN: 39 mg/dL — ABNORMAL HIGH (ref 8–23)
CO2: 24 mmol/L (ref 22–32)
Calcium: 8.8 mg/dL — ABNORMAL LOW (ref 8.9–10.3)
Chloride: 104 mmol/L (ref 98–111)
Creatinine, Ser: 1.6 mg/dL — ABNORMAL HIGH (ref 0.44–1.00)
GFR calc Af Amer: 39 mL/min — ABNORMAL LOW (ref >60)
GFR calc non Af Amer: 34 mL/min — ABNORMAL LOW (ref >60)
Glucose, Bld: 94 mg/dL (ref 70–99)
Potassium: 4.2 mmol/L (ref 3.5–5.1)
Sodium: 135 mmol/L (ref 135–145)

## 2019-10-09 LAB — CBC
HCT: 24.9 % — ABNORMAL LOW (ref 36.0–46.0)
Hemoglobin: 7.7 g/dL — ABNORMAL LOW (ref 12.0–15.0)
MCH: 30.4 pg (ref 26.0–34.0)
MCHC: 30.9 g/dL (ref 30.0–36.0)
MCV: 98.4 fL (ref 80.0–100.0)
Platelets: 194 10*3/uL (ref 150–400)
RBC: 2.53 MIL/uL — ABNORMAL LOW (ref 3.87–5.11)
RDW: 13.6 % (ref 11.5–15.5)
WBC: 8 10*3/uL (ref 4.0–10.5)
nRBC: 0 % (ref 0.0–0.2)

## 2019-10-09 LAB — PROTIME-INR
INR: 2.8 — ABNORMAL HIGH (ref 0.8–1.2)
Prothrombin Time: 29.4 seconds — ABNORMAL HIGH (ref 11.4–15.2)

## 2019-10-09 LAB — MAGNESIUM: Magnesium: 2 mg/dL (ref 1.7–2.4)

## 2019-10-09 MED ORDER — WARFARIN SODIUM 5 MG PO TABS: 5.0000 mg | ORAL_TABLET | Freq: Once | ORAL | Status: DC

## 2019-10-09 MED ORDER — CLINDAMYCIN HCL 300 MG PO CAPS: 300.0000 mg | ORAL_CAPSULE | Freq: Three times a day (TID) | ORAL | 0 refills | Status: AC

## 2019-10-09 MED ORDER — RISAQUAD PO CAPS: 1.0000 | ORAL_CAPSULE | Freq: Every day | ORAL | 0 refills | Status: AC

## 2019-10-09 NOTE — Discharge Summary (Signed)
Physician Discharge Summary  Amy ShellingDeborah G Bryant ZOX:[id number] DOB: 1955/11/21 DOA: 10/06/2019  PCP: Judy Pimpleower, Marne A, MD  Admit date: 10/06/2019 Discharge date: 10/09/2019  Admitted From: Home  Discharge disposition: Home    Recommendations for Outpatient Follow-Up:   Follow up with your primary care provider in one week.  Complete the course of antibiotic   Discharge Diagnosis:   Principal Problem:   Cellulitis of right lower extremity Active Problems:   Hypothyroidism   Diabetes type 2, controlled (HCC)   Hyperparathyroidism (HCC)   Morbid obesity (HCC)   Obesity hypoventilation syndrome (HCC)   GERD   TOBACCO USE, QUIT   CKD (chronic kidney disease) stage 3, GFR 30-59 ml/min   Cellulitis of right lower leg   Hypoglycemia  Discharge Condition: Improved.  Diet recommendation:   Carbohydrate-modified.    Code status: Full.   History of Present Illness:  Dairl PonderDeborah G Wrayis a 64 y.o.femalewith medical history significant ofatrial fibrillation, diastolic CHF, COPD, diabetes, GERD, hypothyroidism, morbidly obese with history of PE and reactive airway disease presented to the hospital with right lower extremity swelling, pain redness and ulcers. Patient had a previous history of  cellulitis and ulcer about 6 months ago where she was admitted and treated. Patient states that her leg has always been swollen due to lymphedema chronically but this get worse in the last 3 days. ED Course:Temperature 99.6 blood pressure 100/69 pulse 84 respirate 22 oxygen sat 96% room air. PT INR 28.72.7. Lactic acid 1.1. COVID-19 so far negative. Sodium 129 potassium of 3.2 chloride 95 CO2 of 23 glucose 44 BUN 57 creatinine 2.40. White count 15.7 hemoglobin 9.3 platelet 172.   Hospital Course:  Following conditions were addressed during hospitalization as listed below, follow-up plan in italics  Cellulitis of the right lower extremity in a diabetic patient.   Improving.  Received  vanco and  cefepime >48 hours. Leukocytosis has resolved. T-max of 98.4.  F.   Will continue clindamycin on discharge to complete 7-day course. Allergic to Augmentin. Patient was advised to keep the affected extremity elevated while lying down and sitting position.  Patient will have to follow-up with primary care physician after completion of the antibiotic.  Diarrhea.   Resolved. No further bowel movements and had formed stool x1.  C. difficile and GI pathogen panel had been ordered but could not be sent.    Hypothyroidism:.   Continue Synthroid on discharge.  Normocytic anemia likely secondary to anemia of chronic kidney disease.  Hemoglobin of 7.7 today.   Diabetes mellitus type II:   Hemoglobin A1c of 5.2.  Resume oral medication on discharge  GERD:Continue PPI  Acute kidney injury on chronic kidney disease stage IIIb:Improved.  Creatinine today is  1.6 today from 1.8.  Patient will resume Lasix on discharge and follow-up with her primary care physician as outpatient.   Morbid obesity, history of COPD and chronic systolic congestive heart failure, cor pulmonale, obesity hypoventilation syndrome.: On 3 L of nasal cannula at baseline.  Resume on discharge.   Hyponatremia:improved. Na of 135 today..    Hypokalemia:improved.  Potassium of 4.2 today.  History of atrial fibrillation.  Currently rate controlled.  Continue anticoagulation with Coumadin on discharge.  INR of 2.8 today.  Right thigh pressure ulcer stage II.  Present on admission.  Continue wound care at home..  Disposition.  At this time, patient is stable for disposition home.  Had a long conversation about the plan for disposition home today with the patient. Social worker  spoke with the patient and patient has refused home health PT at discharge.  Medical Consultants:    None.  Procedures:    None  Subjective:   Today, patient feels okay.  Denies any fever, chills, diarrhea.  Leg swelling and redness has  improved.  Discharge Exam:   Vitals:   10/09/19 0911 10/09/19 1046  BP:  (!) 118/53  Pulse:    Resp:    Temp:    SpO2: 99%    Vitals:   10/09/19 0409 10/09/19 0817 10/09/19 0911 10/09/19 1046  BP: (!) 120/56 (!) 118/53  (!) 118/53  Pulse: 61 62    Resp: 16 18    Temp: 98.4 F (36.9 C) (!) 97.5 F (36.4 C)    TempSrc:  Oral    SpO2: 100% 100% 99%   Weight:      Height:       Body mass index is 52.09 kg/m.  General: Alert awake, not in obvious distress, morbidly obese, on nasal canula HENT: pupils equally reacting to light and accommodation.  No scleral pallor or icterus noted. Oral mucosa is moist.  Chest:  Clear breath sounds.  Diminished breath sounds bilaterally. No crackles or wheezes.  CVS: S1 &S2 heard. No murmur.  Regular rate and rhythm. Abdomen: Soft, nontender, nondistended.  Bowel sounds are heard.   Extremities: Right lower extremity erythematous and tender with thigh ulceration.  Much improved. Psych: Alert, awake and oriented, normal mood CNS:  No cranial nerve deficits.  Power equal in all extremities.   Skin: Right lower extremity erythema edema has improved.  Stage II thigh pressure ulceration present on admission.  The results of significant diagnostics from this hospitalization (including imaging, microbiology, ancillary and laboratory) are listed below for reference.     Diagnostic Studies:   CT ABDOMEN PELVIS WO CONTRAST  Result Date: 10/06/2019 CLINICAL DATA:  Diffuse abdominal pain and diarrhea. Leukocytosis. EXAM: CT ABDOMEN AND PELVIS WITHOUT CONTRAST TECHNIQUE: Multidetector CT imaging of the abdomen and pelvis was performed following the standard protocol without IV contrast. COMPARISON:  03/28/2019 FINDINGS: Lower chest: No acute findings. Hepatobiliary: No mass visualized on this unenhanced exam. Gallstones are seen, however there is no evidence of cholecystitis or biliary dilatation. Pancreas: No mass or inflammatory process visualized on this  unenhanced exam. Spleen:  Within normal limits in size. Adrenals/Urinary tract: Stable 1.5 cm fat attenuation right adrenal mass, consistent with benign myelolipoma. 5 mm calculus is seen in the lower pole of the left kidney, without significant change. There is no evidence ureteral calculi or hydronephrosis . Unremarkable unopacified urinary bladder. Stomach/Bowel: No evidence of obstruction, inflammatory process, or abnormal fluid collections. Diverticulosis is seen mainly involving the descending and sigmoid colon, however there is no evidence of diverticulitis. Vascular/Lymphatic: 13 mm right external iliac lymph node has decreased in size from 17 mm on prior study. Several bilateral inguinal lymph nodes are also stable, largest on the right measuring 16 mm on image 88/2. A 13 mm left paraaortic lymph node is unchanged. No new or or increased lymphadenopathy identified. Aortic atherosclerosis incidentally noted. No evidence of abdominal aortic aneurysm. Reproductive:  No mass or other significant abnormality. Other:  None. Musculoskeletal: No suspicious bone lesions identified. Severe thoracolumbar degenerative spondylosis again noted. IMPRESSION: 1. No acute findings within the abdomen or pelvis. 2. Stable 5 mm left renal calculus. No evidence of ureteral calculi or hydronephrosis. 3. Cholelithiasis. No radiographic evidence of cholecystitis. 4. Colonic diverticulosis. No radiographic evidence of diverticulitis. 5. Stable small right  adrenal benign myelolipoma. 6. Stable to slightly decreased size of bilateral inguinal, right external iliac, and left paraaortic lymph nodes. Aortic Atherosclerosis (ICD10-I70.0). Electronically Signed   By: Danae Orleans M.D.   On: 10/06/2019 18:05   DG Chest Portable 1 View  Result Date: 10/06/2019 CLINICAL DATA:  Shortness of breath, fever EXAM: PORTABLE CHEST 1 VIEW COMPARISON:  04/03/2019 FINDINGS: The heart size and mediastinal contours are within normal limits. Both lungs  are clear. The visualized skeletal structures are unremarkable. IMPRESSION: No active disease. Electronically Signed   By: Elige Ko   On: 10/06/2019 16:26   CT EXTREMITY LOWER RIGHT WO CONTRAST  Result Date: 10/06/2019 CLINICAL DATA:  Right leg cellulitis. EXAM: CT OF THE LOWER RIGHT EXTREMITY WITHOUT CONTRAST TECHNIQUE: Multidetector CT imaging of the right lower extremity was performed according to the standard protocol. COMPARISON:  CT right foot dated March 30, 2019. FINDINGS: Bones/Joint/Cartilage No fracture or dislocation. No cortical destruction or osteolysis. Moderate to severe osteoarthritis of the knee. Mild osteoarthritis of the right tibiotalar joint. Severe osteoarthritis of the first MTP joint. Chronic large well corticated ossific density dorsal to the talar head. No joint effusion. Ligaments Ligaments are suboptimally evaluated by CT. Muscles and Tendons Grossly intact. Mild soleus muscle atrophy. Severe fatty atrophy of the intrinsic foot muscles. Soft tissue Diffuse circumferential soft tissue swelling and skin thickening of the lower leg and foot. No fluid collection or hematoma. No subcutaneous emphysema. No soft tissue mass. IMPRESSION: 1. Diffuse circumferential soft tissue swelling and skin thickening of the lower leg and foot, consistent with reported history of cellulitis. No discrete abscess. 2. No acute osseous abnormality. Electronically Signed   By: Obie Dredge M.D.   On: 10/06/2019 19:38     Labs:   Basic Metabolic Panel: Recent Labs  Lab 10/06/19 1318 10/07/19 0344 10/08/19 0419 10/09/19 0145  NA 129* 132* 136 135  K 3.2* 3.9 4.1 4.2  CL 95* 98 101 104  CO2 23 23 25 24   GLUCOSE 44* 74 83 94  BUN 57* 54* 49* 39*  CREATININE 2.40* 2.25* 1.88* 1.60*  CALCIUM 8.8* 8.3* 8.5* 8.8*  MG  --   --  1.9 2.0   GFR Estimated Creatinine Clearance: 51 mL/min (A) (by C-G formula based on SCr of 1.6 mg/dL (H)). Liver Function Tests: Recent Labs  Lab 10/06/19 1318  10/07/19 0344  AST 38 38  ALT 17 18  ALKPHOS 93 89  BILITOT 0.7 0.7  PROT 6.6 5.6*  ALBUMIN 2.6* 2.1*   Recent Labs  Lab 10/06/19 1318  LIPASE 30   No results for input(s): AMMONIA in the last 168 hours. Coagulation profile Recent Labs  Lab 10/06/19 1948 10/07/19 1513 10/08/19 0419 10/09/19 0145  INR 2.7* 3.2* 3.5* 2.8*    CBC: Recent Labs  Lab 10/06/19 1318 10/07/19 0344 10/08/19 0419 10/09/19 0145  WBC 15.7* 13.0* 10.2 8.0  HGB 9.3* 8.3* 7.7* 7.7*  HCT 29.2* 25.9* 25.4* 24.9*  MCV 94.8 95.2 97.3 98.4  PLT 172 146* 159 194   Cardiac Enzymes: No results for input(s): CKTOTAL, CKMB, CKMBINDEX, TROPONINI in the last 168 hours. BNP: Invalid input(s): POCBNP CBG: Recent Labs  Lab 10/08/19 0643 10/08/19 1115 10/08/19 1641 10/08/19 2142 10/09/19 0635  GLUCAP 73 89 113* 96 68*   D-Dimer No results for input(s): DDIMER in the last 72 hours. Hgb A1c Recent Labs    10/07/19 0344  HGBA1C 5.2   Lipid Profile No results for input(s): CHOL, HDL, LDLCALC, TRIG,  CHOLHDL, LDLDIRECT in the last 72 hours. Thyroid function studies No results for input(s): TSH, T4TOTAL, T3FREE, THYROIDAB in the last 72 hours.  Invalid input(s): FREET3 Anemia work up No results for input(s): VITAMINB12, FOLATE, FERRITIN, TIBC, IRON, RETICCTPCT in the last 72 hours. Microbiology Recent Results (from the past 240 hour(s))  Blood culture (routine x 2)     Status: None (Preliminary result)   Collection Time: 10/06/19  3:48 PM   Specimen: BLOOD  Result Value Ref Range Status   Specimen Description BLOOD LEFT ANTECUBITAL  Final   Special Requests   Final    BOTTLES DRAWN AEROBIC AND ANAEROBIC Blood Culture adequate volume   Culture   Final    NO GROWTH 3 DAYS Performed at [facility], [address] [place], [place] [address]    Report Status PENDING  Incomplete  Respiratory Panel by RT PCR (Flu A&B, Covid) - Nasopharyngeal Swab     Status: None   Collection Time:  10/06/19  3:49 PM   Specimen: Nasopharyngeal Swab  Result Value Ref Range Status   SARS Coronavirus 2 by RT PCR NEGATIVE NEGATIVE Final    Comment: (NOTE) SARS-CoV-2 target nucleic acids are NOT DETECTED. The SARS-CoV-2 RNA is generally detectable in upper respiratoy specimens during the acute phase of infection. The lowest concentration of SARS-CoV-2 viral copies this assay can detect is 131 copies/mL. A negative result does not preclude SARS-Cov-2 infection and should not be used as the sole basis for treatment or other patient management decisions. A negative result may occur with  improper specimen collection/handling, submission of specimen other than nasopharyngeal swab, presence of viral mutation(s) within the areas targeted by this assay, and inadequate number of viral copies (<131 copies/mL). A negative result must be combined with clinical observations, patient history, and epidemiological information. The expected result is Negative. Fact Sheet for Patients:  https://www.moore.com/https://www.fda.gov/media/142436/download Fact Sheet for Healthcare Providers:  https://www.young.biz/https://www.fda.gov/media/142435/download This test is not yet ap proved or cleared by the [place] FDA and  has been authorized for detection and/or diagnosis of SARS-CoV-2 by FDA under an Emergency Use Authorization (EUA). This EUA will remain  in effect (meaning this test can be used) for the duration of the COVID-19 declaration under Section 564(b)(1) of the Act, 21 U.S.C. section 360bbb-3(b)(1), unless the authorization is terminated or revoked sooner.    Influenza A by PCR NEGATIVE NEGATIVE Final   Influenza B by PCR NEGATIVE NEGATIVE Final    Comment: (NOTE) The Xpert Xpress SARS-CoV-2/FLU/RSV assay is intended as an aid in  the diagnosis of influenza from Nasopharyngeal swab specimens and  should not be used as a sole basis for treatment. Nasal washings and  aspirates are unacceptable for Xpert Xpress SARS-CoV-2/FLU/RSV    testing. Fact Sheet for Patients: https://www.moore.com/https://www.fda.gov/media/142436/download Fact Sheet for Healthcare Providers: https://www.young.biz/https://www.fda.gov/media/142435/download This test is not yet approved or cleared by the [place] FDA and  has been authorized for detection and/or diagnosis of SARS-CoV-2 by  FDA under an Emergency Use Authorization (EUA). This EUA will remain  in effect (meaning this test can be used) for the duration of the  Covid-19 declaration under Section 564(b)(1) of the Act, 21  U.S.C. section 360bbb-3(b)(1), unless the authorization is  terminated or revoked. Performed at [facility], [address] [place], [place] [address]   Blood culture (routine x 2)     Status: None (Preliminary result)   Collection Time: 10/06/19  7:48 PM   Specimen: BLOOD  Result Value Ref  Range Status   Specimen Description BLOOD RIGHT ANTECUBITAL  Final   Special Requests   Final    BOTTLES DRAWN AEROBIC AND ANAEROBIC Blood Culture adequate volume   Culture   Final    NO GROWTH 3 DAYS Performed at [facility], [address], [place], [place] [address]    Report Status PENDING  Incomplete     Discharge Instructions:   Discharge Instructions    Call MD for:   Complete by: As directed    Worsening symptoms   Call MD for:  severe uncontrolled pain   Complete by: As directed    Call MD for:  temperature >100.4   Complete by: As directed    Diet Carb Modified   Complete by: As directed    Discharge instructions   Complete by: As directed    Follow up with your primary care provider in 1 week. Complete the course of antibiotics. Keep leg elevated while lying down and sitting up.   Increase activity slowly   Complete by: As directed      Allergies as of 10/09/2019      Reactions   Pseudoephedrine Other (See Comments)   REACTION: tightness in chest   Augmentin [amoxicillin-pot Clavulanate] Itching, Rash   Did it involve swelling of the face/tongue/throat, SOB, or low  BP? No Did it involve sudden or severe rash/hives, skin peeling, or any reaction on the inside of your mouth or nose? Yes Did you need to seek medical attention at a hospital or doctor's office? Yes When did it last happen?2019 If all above answers are "NO", may proceed with cephalosporin use.   Latex Itching, Rash      Medication List    TAKE these medications   acidophilus Caps capsule Take 1 capsule by mouth daily. Start taking on: October 10, 2019   albuterol (2.5 MG/3ML) 0.083% nebulizer solution Commonly known as: PROVENTIL INHALE 1 VIAL VIA NEBULIZER EVERY 4 HOURS AS NEEDED FOR WHEEZING What changed:   how much to take  how to take this  when to take this  reasons to take this  additional instructions   ALPRAZolam 0.5 MG tablet Commonly known as: XANAX TAKE 1 TABLET BY MOUTH 2 TIMES DAILY AS NEEDED FOR ANXIETY (PANIC ATTACK) What changed:   how much to take  how to take this  when to take this  reasons to take this  additional instructions   budesonide-formoterol 160-4.5 MCG/ACT inhaler Commonly known as: Symbicort INHALE 2 PUFFS INTO LUNGS TWICE A DAY What changed:   how much to take  how to take this  when to take this  additional instructions   cholecalciferol 1000 units tablet Commonly known as: VITAMIN D Take 4,000 Units by mouth daily.   ciclopirox 0.77 % cream Commonly known as: LOPROX APPLY TO AFFECTED AREA TWICE A DAY AS NEEDED What changed:   how much to take  how to take this  when to take this  reasons to take this  additional instructions   clindamycin 300 MG capsule Commonly known as: Cleocin Take 1 capsule (300 mg total) by mouth 3 (three) times daily for 5 days.   diltiazem 120 MG 24 hr capsule Commonly known as: Cartia XT Take 1 capsule (120 mg total) by mouth daily.   Elocon 0.1 % cream Generic drug: mometasone Apply 1 application topically See admin instructions. Tiny amount to each ear canal twice  weekly as needed.   FLUoxetine 40 MG capsule Commonly known  as: PROZAC Take 1 capsule (40 mg total) by mouth daily.   furosemide 80 MG tablet Commonly known as: LASIX Take 1 tablet (80 mg total) by mouth 2 (two) times daily.   Gerhardt's butt cream Crea Apply 1 application topically 3 (three) times daily as needed for irritation.   glipiZIDE 5 MG 24 hr tablet Commonly known as: GLUCOTROL XL TAKE 1 TABLET BY MOUTH EVERY DAY WITH BREAKFAST What changed:   how much to take  how to take this  when to take this  additional instructions   glucose blood test strip Test blood sugar once daily and as needed for DM 250.0   iron polysaccharides 150 MG capsule Commonly known as: Nu-Iron Take 1 capsule (150 mg total) by mouth 2 (two) times daily.   levothyroxine 175 MCG tablet Commonly known as: SYNTHROID TAKE 1 TABLET BY MOUTH DAILY BEFORE BREAKFAST. What changed:   how much to take  how to take this  when to take this  additional instructions   metFORMIN 1000 MG tablet Commonly known as: GLUCOPHAGE Take 1 tablet (1,000 mg total) by mouth 2 (two) times daily with a meal.   mupirocin ointment 2 % Commonly known as: BACTROBAN APPLY TO AFFECTED AREA TWICE A DAY What changed:   how much to take  how to take this  when to take this  reasons to take this  additional instructions   onetouch ultrasoft lancets Test blood sugar once daily and as needed for DM 250.0   pantoprazole 40 MG tablet Commonly known as: PROTONIX Take 1 tablet (40 mg total) by mouth daily at 6 (six) AM.   potassium chloride SA 20 MEQ tablet Commonly known as: Klor-Con M20 TAKE 2 TABLETS BY MOUTH EVERY DAY What changed:   how much to take  how to take this  when to take this  additional instructions   senna-docusate 8.6-50 MG tablet Commonly known as: Senokot-S Take 1 tablet by mouth 2 (two) times daily.   simvastatin 20 MG tablet Commonly known as: ZOCOR TAKE 1 TABLET BY  MOUTH EVERYDAY AT BEDTIME What changed:   how much to take  how to take this  when to take this  additional instructions   traMADol 50 MG tablet Commonly known as: ULTRAM 1TAB BY MOUTH EVERY 12HRS AS NEEDED FOR MODERATE/SEVERE PAIN. CAUTION OF SEDATION/FALLS/CONSTIPATION What changed:   how much to take  how to take this  when to take this  reasons to take this  additional instructions   warfarin 5 MG tablet Commonly known as: COUMADIN Take as directed. If you are unsure how to take this medication, talk to your nurse or doctor. Original instructions: Take 1/2 tablet daily except take 1 tablet on Mon and Fridays or TAKE AS DIRECTED BY ANTICOAGULATION CLINIC What changed:   how much to take  how to take this  when to take this  additional instructions       Time coordinating discharge: 39 minutes  Signed:  Talishia Betzler  [facility] 10/09/2019, 11:03 AM

## 2019-10-09 NOTE — Plan of Care (Signed)

## 2019-10-09 NOTE — Progress Notes (Signed)
The patient was made aware of a written discharge order for today.  A written discharge packet has been printed for the patient.  The patient was under the impression that she was discharged tomorrow and was not prepared for today.  She is currently attempting to get in touch with her son, whom she lives with.  A secure epic text message was sent to Dr Tyson Babinski  In regards to the patient's concerns.

## 2019-10-09 NOTE — Clinical Social Work Note (Signed)
Pt recommending HHPT at discharge. CSW spoke with patient and she states that she does not feel that she needs HHPT at this time. However, she states that she will follow up with CSW should she change her mind.   CSW signing off.

## 2019-10-09 NOTE — Progress Notes (Signed)
Patients blood sugar 68 @ 0635   Given 2 cups of orange juice, will recheck

## 2019-10-09 NOTE — Progress Notes (Signed)
ANTICOAGULATION CONSULT NOTE - Follow Up Consult  Pharmacy Consult for Warfarin Indication: atrial fibrillation and hypercoaguable state, hx PE and DVT  Allergies  Allergen Reactions  . Pseudoephedrine Other (See Comments)    REACTION: tightness in chest  . Augmentin [Amoxicillin-Pot Clavulanate] Itching and Rash    Did it involve swelling of the face/tongue/throat, SOB, or low BP? No Did it involve sudden or severe rash/hives, skin peeling, or any reaction on the inside of your mouth or nose? Yes Did you need to seek medical attention at a hospital or doctor's office? Yes When did it last happen?2019 If all above answers are "NO", may proceed with cephalosporin use.   . Latex Itching and Rash   Patient Measurements: Height: 5\' 5"  (165.1 cm) Weight: (!) 313 lb 0.9 oz (142 kg) IBW/kg (Calculated) : 57  Vital Signs: Temp: 97.5 F (36.4 C) (01/08 0817) Temp Source: Oral (01/08 0817) BP: 118/53 (01/08 0817) Pulse Rate: 62 (01/08 0817)  Labs: Recent Labs    10/06/19 1948 10/07/19 0344 10/07/19 1513 10/08/19 0419 10/09/19 0145  HGB   < > 8.3*  --  7.7* 7.7*  HCT  --  25.9*  --  25.4* 24.9*  PLT  --  146*  --  159 194  LABPROT  --   --  32.7* 35.4* 29.4*  INR  --   --  3.2* 3.5* 2.8*  CREATININE  --  2.25*  --  1.88* 1.60*   < > = values in this interval not displayed.    Estimated Creatinine Clearance: 51 mL/min (A) (by C-G formula based on SCr of 1.6 mg/dL (H)).  Assessment: 64 yr old female on warfarin PTA for hypercoagulable state, hx PE and DVT and atrial fibrillation.   Pharmacy asked to manage warfarin dosing.     INR has trended down today to 2.8  PTA regimen:  5 mg MWF, 2.5 mg TTSS   Goal of Therapy:  INR 2-3 Monitor platelets by anticoagulation protocol: Yes   Plan:   Warfarin 5 mg po x 1 tonight  Daily PT/INR.  Thank you 77, PharmD  10/09/2019,8:58 AM

## 2019-10-11 LAB — CULTURE, BLOOD (ROUTINE X 2)
Culture: NO GROWTH
Culture: NO GROWTH
Special Requests: ADEQUATE
Special Requests: ADEQUATE

## 2019-10-12 ENCOUNTER — Telehealth: Payer: Self-pay

## 2019-10-12 NOTE — Telephone Encounter (Signed)
Transition Care Management Follow-up Telephone Call  Date of discharge and from where: 10/09/2019 from [facility]  How have you been since you were released from the hospital? Patient states that she is doing much better. No complaints at this time.   Any questions or concerns? No   Items Reviewed:  Did the pt receive and understand the discharge instructions provided? Yes   Medications obtained and verified? Yes   Any new allergies since your discharge? No   Dietary orders reviewed? Yes  Do you have support at home? Yes   Functional Questionnaire: (I = Independent and D = Dependent) ADLs: I  Bathing/Dressing- I  Meal Prep- I  Eating- I  Maintaining continence- I  Transferring/Ambulation- I  Managing Meds- I  Follow up appointments reviewed:   PCP Hospital f/u appt confirmed? Yes  Scheduled to see Dr. [name] on 10/19/2019 @ 2:30 pm virtually.  Specialist Hospital f/u appt confirmed? N/A   Are transportation arrangements needed? No   If their condition worsens, is the pt aware to call PCP or go to the Emergency Dept.? Yes  Was the patient provided with contact information for the PCP's office or ED? Yes  Was to pt encouraged to call back with questions or concerns? Yes

## 2019-10-12 NOTE — Telephone Encounter (Signed)
Amy Manuel LPN spoke with pt on 10/12/19 per transition of care note and pt had no complaints.

## 2019-10-12 NOTE — Telephone Encounter (Signed)
[facility] Night - Client TELEPHONE ADVICE RECORD [facility] Patient Name: Amy Bryant Gender: Female DOB: 11-17-1955 Age: 64 Y 90 M 26 D Return Phone Number: [phone] (Primary), [phone] (Secondary) Address: City/State/Zip: [place] [address] Client [facility] Night - Client Client Site [name], Roque Lias - MD Contact Type Call Who Is Calling Patient / Member / Family / Caregiver Call Type Triage / Clinical Caller Name Al Dunevant Relationship To Patient Son Return Phone Number [phone] (Primary) Chief Complaint Prescription Refill or Medication Request (non symptomatic) Reason for Call Symptomatic / Request for [place] states his mother was let out of hospital for cellulitis yesterday. The discharge papers said for her to stop any excessive movement that cause breathing issues. She's on tramadol. She is in a lot of pain and it's causing her to have breathing issues. He'd like to know if something can be called into help her. He states if possible please view her discharge. Translation No Nurse Assessment Nurse: Laurann Montana, RN, [facility] Date/Time Eilene Ghazi Time): 10/10/2019 3:41:57 PM Confirm and document reason for call. If symptomatic, describe symptoms. ---Caller states that patient was discharged from hospital after stay for cellulitis. Caller states that patient has Tramadol 50 mg. Caller states that medication isn't helping with the pain. Caller states that patient was sent home on atb. Caller states that patient has been in pain since discharge. Caller states that patient is having trouble breathing because she is holding her breath dt pain. Caller states no other symptoms. Has the patient had close contact with a person known or suspected to have the novel coronavirus illness OR traveled / lives in area with major  community spread (including international travel) in the last 14 days from the onset of symptoms? * If Asymptomatic, screen for exposure and travel within the last 14 days. ---No Does the patient have any new or worsening symptoms? ---Yes Will a triage be completed? ---Yes Related visit to physician within the last 2 weeks? ---No Does the PT have any chronic conditions? (i.e. diabetes, asthma, this includes High risk factors for pregnancy, etc.) ---Yes List chronic conditions. ---DM, COPD, CHF Is this a behavioral health or substance abuse call? ---No PLEASE NOTE: All timestamps contained within this report are represented as [place] Standard Time. CONFIDENTIALTY NOTICE: This fax transmission is intended only for the addressee. It contains information that is legally privileged, confidential or otherwise protected from use or disclosure. If you are not the intended recipient, you are strictly prohibited from reviewing, disclosing, copying using or disseminating any of this information or taking any action in reliance on or regarding this information. If you have received this fax in error, please notify us immediately by telephone so that we can arrange for its return to [place]. Phone: [phone], Toll-Free: [phone], Fax: [phone] Page: 2 of 2 Call Id: [id number] Nurse Assessment Guidelines Guideline Title Affirmed Question Affirmed Notes Nurse Date/Time Eilene Ghazi Time) Cellulitis on Antibiotic Follow-up Call SEVERE pain Vira Agar 10/10/2019 3:45:44 PM Disp. Time Eilene Ghazi Time) Disposition Final User 10/10/2019 3:47:58 PM Go to ED Now Yes Laurann Montana, RN, [place] Disagree/Comply Disagree Caller Understands Yes PreDisposition InappropriateToAsk Care Advice Given Per Guideline GO TO ED NOW: * Leave now. Drive carefully. PAIN MEDICINES: * For pain relief, you can take either acetaminophen, ibuprofen, or naproxen. * ACETAMINOPHEN - REGULAR STRENGTH  TYLENOL: Take 650 mg (two 325 mg pills) by mouth every  4-6 hours as needed. Each Regular Strength Tylenol pill has 325 mg of acetaminophen. The most you should take each day is 3,250 mg (10 pills a day). CARE ADVICE given per Cellulitis on Antibiotic Follow-Up Call (Adult) guideline. Comments User: Tempie Hoist, Valentina Lucks, RN Date/Time Lamount Cohen Time): 10/10/2019 3:47:57 PM Caller refused ED now disposition. Caller states that he is going to call ED and see what they suggest. Referrals GO TO FACILITY REFUSED

## 2019-10-13 DIAGNOSIS — M6281 Muscle weakness (generalized): Secondary | ICD-10-CM | POA: Diagnosis not present

## 2019-10-13 DIAGNOSIS — J9621 Acute and chronic respiratory failure with hypoxia: Secondary | ICD-10-CM | POA: Diagnosis not present

## 2019-10-13 DIAGNOSIS — R2689 Other abnormalities of gait and mobility: Secondary | ICD-10-CM | POA: Diagnosis not present

## 2019-10-14 ENCOUNTER — Telehealth: Payer: Self-pay | Admitting: Family Medicine

## 2019-10-14 ENCOUNTER — Ambulatory Visit (INDEPENDENT_AMBULATORY_CARE_PROVIDER_SITE_OTHER): Payer: Medicare HMO

## 2019-10-14 DIAGNOSIS — Z5181 Encounter for therapeutic drug level monitoring: Secondary | ICD-10-CM | POA: Diagnosis not present

## 2019-10-14 DIAGNOSIS — J9621 Acute and chronic respiratory failure with hypoxia: Secondary | ICD-10-CM | POA: Diagnosis not present

## 2019-10-14 DIAGNOSIS — I4891 Unspecified atrial fibrillation: Secondary | ICD-10-CM | POA: Diagnosis not present

## 2019-10-14 DIAGNOSIS — I824Y9 Acute embolism and thrombosis of unspecified deep veins of unspecified proximal lower extremity: Secondary | ICD-10-CM

## 2019-10-14 DIAGNOSIS — Z86711 Personal history of pulmonary embolism: Secondary | ICD-10-CM | POA: Diagnosis not present

## 2019-10-14 DIAGNOSIS — D509 Iron deficiency anemia, unspecified: Secondary | ICD-10-CM | POA: Diagnosis not present

## 2019-10-14 DIAGNOSIS — Z7901 Long term (current) use of anticoagulants: Secondary | ICD-10-CM

## 2019-10-14 DIAGNOSIS — I279 Pulmonary heart disease, unspecified: Secondary | ICD-10-CM | POA: Diagnosis not present

## 2019-10-14 DIAGNOSIS — E119 Type 2 diabetes mellitus without complications: Secondary | ICD-10-CM | POA: Diagnosis not present

## 2019-10-14 DIAGNOSIS — D6859 Other primary thrombophilia: Secondary | ICD-10-CM

## 2019-10-14 DIAGNOSIS — L03115 Cellulitis of right lower limb: Secondary | ICD-10-CM | POA: Diagnosis not present

## 2019-10-14 DIAGNOSIS — I5033 Acute on chronic diastolic (congestive) heart failure: Secondary | ICD-10-CM | POA: Diagnosis not present

## 2019-10-14 DIAGNOSIS — J449 Chronic obstructive pulmonary disease, unspecified: Secondary | ICD-10-CM | POA: Diagnosis not present

## 2019-10-14 NOTE — Telephone Encounter (Signed)
Please ok that verbal order  Sending INR to [facility] as well

## 2019-10-14 NOTE — Telephone Encounter (Signed)
Marisue Ivan with [facility] called to report the patient's INR and also to request verbal orders.   INR- 2.9   Patient has cellulitis on her right leg and there are a few places that are draining. She would like a verbal order for ABD pads, changing daily.   PHONE- [phone]

## 2019-10-14 NOTE — Telephone Encounter (Signed)
This has been documented in an anticoag encounter 

## 2019-10-14 NOTE — Patient Instructions (Addendum)
Pre visit review using our clinic review tool, if applicable. No additional management support is needed unless otherwise documented below in the visit note.   Pt is in range today. No changes in dosing. Continue 5mg  M, W, F and 2.5mg  T, Th, S, Sun. Recheck in 2 weeks. Contacted pt and advised. Pt verbalized understanding. 01-21-1972 at [facility]. [facility] verbalized understanding.

## 2019-10-14 NOTE — Telephone Encounter (Signed)
Verbal order given to Marisue Ivan, will route INR results to new coumadin nurse Carollee Herter

## 2019-10-15 LAB — POCT INR: INR: 2.9 (ref 2.0–3.0)

## 2019-10-19 ENCOUNTER — Ambulatory Visit (INDEPENDENT_AMBULATORY_CARE_PROVIDER_SITE_OTHER): Payer: Medicare HMO | Admitting: Family Medicine

## 2019-10-19 ENCOUNTER — Encounter: Payer: Self-pay | Admitting: Family Medicine

## 2019-10-19 DIAGNOSIS — E1121 Type 2 diabetes mellitus with diabetic nephropathy: Secondary | ICD-10-CM | POA: Diagnosis not present

## 2019-10-19 DIAGNOSIS — N1832 Chronic kidney disease, stage 3b: Secondary | ICD-10-CM | POA: Diagnosis not present

## 2019-10-19 DIAGNOSIS — L89200 Pressure ulcer of unspecified hip, unstageable: Secondary | ICD-10-CM | POA: Diagnosis not present

## 2019-10-19 DIAGNOSIS — E213 Hyperparathyroidism, unspecified: Secondary | ICD-10-CM | POA: Diagnosis not present

## 2019-10-19 DIAGNOSIS — D509 Iron deficiency anemia, unspecified: Secondary | ICD-10-CM

## 2019-10-19 DIAGNOSIS — E162 Hypoglycemia, unspecified: Secondary | ICD-10-CM

## 2019-10-19 DIAGNOSIS — L03115 Cellulitis of right lower limb: Secondary | ICD-10-CM | POA: Diagnosis not present

## 2019-10-19 DIAGNOSIS — G4733 Obstructive sleep apnea (adult) (pediatric): Secondary | ICD-10-CM | POA: Diagnosis not present

## 2019-10-19 DIAGNOSIS — B370 Candidal stomatitis: Secondary | ICD-10-CM

## 2019-10-19 DIAGNOSIS — D689 Coagulation defect, unspecified: Secondary | ICD-10-CM | POA: Diagnosis not present

## 2019-10-19 DIAGNOSIS — I279 Pulmonary heart disease, unspecified: Secondary | ICD-10-CM

## 2019-10-19 DIAGNOSIS — J9621 Acute and chronic respiratory failure with hypoxia: Secondary | ICD-10-CM

## 2019-10-19 MED ORDER — CLINDAMYCIN HCL 300 MG PO CAPS: 300.0000 mg | ORAL_CAPSULE | Freq: Three times a day (TID) | ORAL | 0 refills | Status: DC

## 2019-10-19 MED ORDER — NYSTATIN 100000 UNIT/ML MT SUSP: 5.0000 mL | Freq: Three times a day (TID) | OROMUCOSAL | 0 refills | Status: AC

## 2019-10-19 MED ORDER — FLUCONAZOLE 150 MG PO TABS: 150.0000 mg | ORAL_TABLET | Freq: Once | ORAL | 0 refills | Status: AC

## 2019-10-19 MED ORDER — ALPRAZOLAM 0.5 MG PO TABS: 0.5000 mg | ORAL_TABLET | Freq: Two times a day (BID) | ORAL | 1 refills | Status: AC | PRN

## 2019-10-19 NOTE — Assessment & Plan Note (Signed)
Has had iron def in the past-continue nu iron  Hb 7.7 at last hospitalization  Suspect both iron def and anemia of chronic dz (from CKD)  Re check next week  No longer bleeding from legs (which was issue in the past)

## 2019-10-19 NOTE — Assessment & Plan Note (Signed)
No clinical changes Lab Results  Component Value Date   CALCIUM 8.8 (L) 10/09/2019   PHOS 2.5 12/03/2017

## 2019-10-19 NOTE — Assessment & Plan Note (Signed)
Pt complains about mouth discomfort and sores since being on abx Suspect thrush (phone visit-not obs)  Px nystatin oral solution  inst to call if no improvement

## 2019-10-19 NOTE — Assessment & Plan Note (Signed)
Encouraged healthier eating and activity as tolerated  Significant wt loss over the past year

## 2019-10-19 NOTE — Patient Instructions (Addendum)
Hold your glipizide Keep drinking water  I sent in another week of clindamycin for your leg Also diflucan-if needed Nystatin mouthwash for thrush  I will send an order to home care for INR this week and labs next week for anemia and kidney function

## 2019-10-19 NOTE — Assessment & Plan Note (Addendum)
Recent hospitalization with improvement Reviewed hospital records, lab results and studies in detail   Sent home with 7d of clindamycin Pt reports continued swelling and pain - (though has improved) Will extend the clindamycin for 7 more days (following INR closely)  Diflucan sent for yeast prn Continues home care with wound dressing and tx of lymphedema

## 2019-10-19 NOTE — Assessment & Plan Note (Signed)
Pt continues 3L 02  Wt loss has helped some

## 2019-10-19 NOTE — Assessment & Plan Note (Signed)
Cr is 1.6 at d/c (improved but higher than baseline) Takes furosemide but not on a fluid restriction  Also Hb of 7.7 Now home/drinking more fluids and starting to get nutrition  Will plan on lab draw from home care next week  Most likely will need nephrology referral (pt in agreement)  Enc fluid as tolerate Stay off metformin  Watch glocuse control

## 2019-10-19 NOTE — Assessment & Plan Note (Signed)
Cut out glipizide today  inst to alert [place] if no imp

## 2019-10-19 NOTE — Progress Notes (Signed)
Virtual Visit via Telephone Note  I connected with Amy Bryant on 10/19/19 at  2:30 PM EST by telephone and verified that I am speaking with the correct person using two identifiers.  Location: Patient: home Provider: office    I discussed the limitations, risks, security and privacy concerns of performing an evaluation and management service by telephone and the availability of in person appointments. I also discussed with the patient that there may be a patient responsible charge related to this service. The patient expressed understanding and agreed to proceed.  Parties involved in encounter  Patient: Amy Bryant   Provider:  Roxy Manns MD    History of Present Illness: Pt presents for f/u of hospitalization from 1/5 to 10/09/19 for cellulitis of RLE She has chronic lymphedema and has had cellulitis more than once   Lost her husband recently-and was not taking care of her  Son stays with her  Takes xanax prn -needs refill   Hospital course reviewed with pt As followed from D/C summary  Hospital Course:  Following conditions were addressed during hospitalization as listed below, follow-up plan in italics  Cellulitis of the right lower extremity in a diabetic patient.  Improving.  Received  vanco and cefepime >48 hours. Leukocytosis has resolved.T-max of 98.4.  F.  Will continue clindamycin on discharge to complete 7-day course. Allergic to Augmentin. Patient was advised to keep the affected extremity elevated while lying down and sitting position.  Patient will have to follow-up with primary care physician after completion of the antibiotic.  Took clindamycin for 7 d course She is done with that now-tolerated it well   Her leg is doing better - some peeling and looking better  Still still swollen at baseline  Still weeping clear fluid  Home care wraps it for her -just gauze  She cleans it with a anti bacterial spray  She is able to put a little weight on it (sore)    Had some thrush symptoms - also and took diflucan for yeast     Diarrhea.  Resolved.No further bowel movements and had formed stool x1.C. difficile and GI pathogen panel had been orderedbutcould not be sent.   No more diarrhea now at home   Hypothyroidism:.ContinueSynthroid on discharge.  Normocytic anemialikely secondary to anemia of chronic kidney disease. Hemoglobin of 7.7 today. No longer having bleeding from her legs like she was  Still takes iron 2 pills per day    Diabetes mellitus type II:  Hemoglobin A1c of 5.2.  Resume oral medication on discharge Her glucose readings have been low  Was taking glipizide 5 - needs to stop it  No longer takes metformin due to renal issues   Eating less/ not much appetite  Has lost wt over the past year  310 lb in the hospital     GERD:Continue PPI No new symptoms   Acute kidney injury onchronic kidney disease stage IIIb:Improved.  Creatininetoday is 1.6 today from 1.8.  Patient will resume Lasix on discharge and follow-up with her primary care physician as outpatient.  She drinks water constantly   occ sprite-not often  Takes lasix  No CHF symptoms at all  Has not seen a nephrologist in the past (due to home bound status)  Now CKD likely affecting blood count    Morbid obesity, history of COPD andchronic systoliccongestive heart failure,cor pulmonale, obesity hypoventilation syndrome.:On 3L of nasal cannula at baseline. Resume on discharge. Pt does note improvement of breathing with wt loss  Hyponatremia:improved.Na of 135 today..    Hypokalemia:improved.Potassium of 4.2 today.  History of atrial fibrillation. Currently rate controlled. Continue anticoagulation with Coumadin on discharge.  INR of 2.8 today.  Right thigh pressure ulcer stage II.Present on admission.Continuewound care at home.. Home health continues to wrap this and monitor   Disposition.  At this  time, patient is stable for disposition home.  Had a long conversation about the plan for disposition home today with the patient. Social worker spoke with the patient and patient has refused home health PT at discharge.    C1. No acute findings within the abdomen or pelvis. 2. Stable 5 mm left renal calculus. No evidence of ureteral calculi or hydronephrosis. 3. Cholelithiasis. No radiographic evidence of cholecystitis. 4. Colonic diverticulosis. No radiographic evidence of diverticulitis. 5. Stable small right adrenal benign myelolipoma. 6. Stable to slightly decreased size of bilateral inguinal, right external iliac, and left paraaortic lymph nodes. Aortic Atherosclerosis (ICD10-I70.0). T scan during hosp:  CT scan of extremity IMPRESSION: 1. Diffuse circumferential soft tissue swelling and skin thickening of the lower leg and foot, consistent with reported history of cellulitis. No discrete abscess. 2. No acute osseous abnormality. Electronically Signed   By: Obie Dredge M.D.   On: 10/06/2019 19:38   Lab Results  Component Value Date   CREATININE 1.60 (H) 10/09/2019   BUN 39 (H) 10/09/2019   NA 135 10/09/2019   K 4.2 10/09/2019   CL 104 10/09/2019   CO2 24 10/09/2019   Lab Results  Component Value Date   INR 2.9 10/14/2019   INR 2.8 (H) 10/09/2019   INR 3.5 (H) 10/08/2019   Lab Results  Component Value Date   HGBA1C 5.2 10/07/2019   Lab Results  Component Value Date   WBC 8.0 10/09/2019   HGB 7.7 (L) 10/09/2019   HCT 24.9 (L) 10/09/2019   MCV 98.4 10/09/2019   PLT 194 10/09/2019   Lab Results  Component Value Date   FERRITIN 82 04/09/2019    Blood cultures were all negative   Flu and covid tests were negative   Nurse is coming out once per week  Has not started some PT yet   She uses walker to get from living room to the kitchen  Gets in and out of the bathroom   Breathing is pretty good right now (with wt loss)   Patient Active Problem List   Diagnosis  Date Noted  . Cellulitis of right lower leg 10/06/2019  . Hypoglycemia 10/06/2019  . CKD (chronic kidney disease) stage 3, GFR 30-59 ml/min 07/31/2019  . Elevated serum creatinine 06/26/2019  . Acute venous embolism and thrombosis of deep vessels of proximal lower extremity (HCC) 06/04/2019  . Long term (current) use of anticoagulants 06/04/2019  . Primary hypercoagulable state (HCC) 06/04/2019  . Aspiration pneumonia (HCC) 04/11/2019  . Acute on chronic diastolic CHF (congestive heart failure) (HCC) 04/11/2019  . Cellulitis of right lower extremity   . Cellulitis of right leg 03/28/2019  . Acute on chronic respiratory failure with hypoxia (HCC) 03/28/2019  . Panic attacks 01/20/2019  . Iron deficiency anemia: Severe 01/14/2019  . Pressure injury of skin 01/14/2019  . Symptomatic anemia   . Coagulopathy (HCC)   . Chronic anticoagulation   . Absolute anemia 01/08/2019  . Reactive depression (situational) 12/29/2018  . Mobility impaired 12/12/2018  . Hematuria 12/03/2017  . Pedal edema 12/23/2016  . OSA (obstructive sleep apnea) 12/21/2016  . Financial difficulties 09/16/2016  . Colon cancer screening 12/15/2013  .  Routine general medical examination at a health care facility 05/20/2012  . Vitamin D deficiency 11/15/2010  . DYSURIA, HX OF 08/19/2009  . PURE HYPERCHOLESTEROLEMIA 08/16/2009  . Diabetes type 2, controlled (HCC) 08/13/2008  . Hyperparathyroidism (HCC) 07/30/2008  . Hypothyroidism 02/13/2008  . Morbid obesity (HCC) 02/04/2008  . Reactive airway disease 02/04/2008  . GERD 02/04/2008  . DISC DISEASE, LUMBAR 02/04/2008  . TOBACCO USE, QUIT 02/04/2008  . Obesity hypoventilation syndrome (HCC) 08/11/2007  . Chronic pulmonary heart disease (HCC) 08/11/2007   Past Medical History:  Diagnosis Date  . Atrial fibrillation (HCC)    with cardioversion success  . Cellulitis 10/2019   right lower extremity  . CHF (congestive heart failure) (HCC)   . COPD (chronic  obstructive pulmonary disease) (HCC)   . Cor pulmonale (HCC)    and obesity hypoventilation syndrome  . Diabetes mellitus without complication (HCC)   . GERD (gastroesophageal reflux disease)   . Hypothyroid   . Morbid obesity (HCC)   . OA (osteoarthritis)   . Pulmonary embolism (HCC)    hx of on coumadin  . Reactive airways dysfunction syndrome [facility](HCC)    Past Surgical History:  Procedure Laterality Date  . BIOPSY  01/12/2019   Procedure: BIOPSY;  Surgeon: Beverley FiedlerPyrtle, Jay M, MD;  [facility];  Service: Gastroenterology;;  . COLONOSCOPY WITH PROPOFOL N/A 01/12/2019   Procedure: COLONOSCOPY WITH PROPOFOL;  Surgeon: Beverley FiedlerPyrtle, Jay M, MD;  [facility];  Service: Gastroenterology;  Laterality: N/A;  . ESOPHAGOGASTRODUODENOSCOPY (EGD) WITH PROPOFOL N/A 01/12/2019   Procedure: ESOPHAGOGASTRODUODENOSCOPY (EGD) WITH PROPOFOL;  Surgeon: Beverley FiedlerPyrtle, Jay M, MD;  [facility];  Service: Gastroenterology;  Laterality: N/A;  . TONSILLECTOMY     Social History   Tobacco Use  . Smoking status: Former Games developermoker  . Smokeless tobacco: Former Engineer, waterUser  Substance Use Topics  . Alcohol use: No    Alcohol/week: 0.0 standard drinks  . Drug use: No   Family History  Problem Relation Age of Onset  . Heart disease Father [id number]       MI  . Diabetes Brother    Allergies  Allergen Reactions  . Pseudoephedrine Other (See Comments)    REACTION: tightness in chest  . Augmentin [Amoxicillin-Pot Clavulanate] Itching and Rash    Did it involve swelling of the face/tongue/throat, SOB, or low BP? No Did it involve sudden or severe rash/hives, skin peeling, or any reaction on the inside of your mouth or nose? Yes Did you need to seek medical attention at a hospital or doctor's office? Yes When did it last happen?2019 If all above answers are "NO", may proceed with cephalosporin use.   . Latex Itching and Rash   Current Outpatient Medications on File Prior to Visit  Medication Sig Dispense  Refill  . acidophilus (RISAQUAD) CAPS capsule Take 1 capsule by mouth daily. 30 capsule 0  . albuterol (PROVENTIL) (2.5 MG/3ML) 0.083% nebulizer solution INHALE 1 VIAL VIA NEBULIZER EVERY 4 HOURS AS NEEDED FOR WHEEZING (Patient taking differently: Take 2.5 mg by nebulization every 4 (four) hours as needed for wheezing or shortness of breath. ) 675 mL 2  . budesonide-formoterol (SYMBICORT) 160-4.5 MCG/ACT inhaler INHALE 2 PUFFS INTO LUNGS TWICE A DAY (Patient taking differently: Inhale 2 puffs into the lungs 2 (two) times daily. ) 3 Inhaler 1  . cholecalciferol (VITAMIN D) 1000 UNITS tablet Take 4,000 Units by mouth daily.    . ciclopirox (LOPROX) 0.77 % cream APPLY TO AFFECTED AREA TWICE A DAY AS NEEDED (  Patient taking differently: Apply 1 application topically 2 (two) times daily as needed (skin care under breast). ) 90 g 0  . diltiazem (CARTIA XT) 120 MG 24 hr capsule Take 1 capsule (120 mg total) by mouth daily. 90 capsule 3  . FLUoxetine (PROZAC) 40 MG capsule Take 1 capsule (40 mg total) by mouth daily. 90 capsule 1  . furosemide (LASIX) 80 MG tablet Take 1 tablet (80 mg total) by mouth 2 (two) times daily. 180 tablet 2  . glucose blood test strip Test blood sugar once daily and as needed for DM 250.0 100 each 3  . Hydrocortisone (GERHARDT'S BUTT CREAM) CREA Apply 1 application topically 3 (three) times daily as needed for irritation. 1 each 0  . iron polysaccharides (NU-IRON) 150 MG capsule Take 1 capsule (150 mg total) by mouth 2 (two) times daily. 180 capsule 1  . Lancets (ONETOUCH ULTRASOFT) lancets Test blood sugar once daily and as needed for DM 250.0     . levothyroxine (SYNTHROID) 175 MCG tablet TAKE 1 TABLET BY MOUTH DAILY BEFORE BREAKFAST. (Patient taking differently: Take 175 mcg by mouth daily before breakfast. ) 90 tablet 2  . mometasone (ELOCON) 0.1 % cream Apply 1 application topically See admin instructions. Tiny amount to each ear canal twice weekly as needed.    . mupirocin  ointment (BACTROBAN) 2 % APPLY TO AFFECTED AREA TWICE A DAY (Patient taking differently: Apply 1 application topically as needed (skin). ) 22 g 4  . pantoprazole (PROTONIX) 40 MG tablet Take 1 tablet (40 mg total) by mouth daily at 6 (six) AM. 90 tablet 2  . potassium chloride SA (KLOR-CON M20) 20 MEQ tablet TAKE 2 TABLETS BY MOUTH EVERY DAY (Patient taking differently: Take 40 mEq by mouth daily. ) 180 tablet 1  . senna-docusate (SENOKOT-S) 8.6-50 MG tablet Take 1 tablet by mouth 2 (two) times daily.    . simvastatin (ZOCOR) 20 MG tablet TAKE 1 TABLET BY MOUTH EVERYDAY AT BEDTIME (Patient taking differently: Take 20 mg by mouth at bedtime. ) 90 tablet 2  . traMADol (ULTRAM) 50 MG tablet 1TAB BY MOUTH EVERY 12HRS AS NEEDED FOR MODERATE/SEVERE PAIN. CAUTION OF SEDATION/FALLS/CONSTIPATION (Patient taking differently: Take 50 mg by mouth every 12 (twelve) hours as needed for moderate pain. ) 180 tablet 0  . warfarin (COUMADIN) 5 MG tablet Take 1/2 tablet daily except take 1 tablet on Mon and Fridays or TAKE AS [place] (Patient taking differently: Take 2.5-5 mg by mouth See admin instructions. MWF take 5mg . TTSS take 2.5mg ) 90 tablet 1   No current facility-administered medications on file prior to visit.    Observations/Objective: Pt sounds well /not in distress  She becomes tearful when discussing recent death of her husband  Not hoarse/ nl speech  Nl cognition / good historian  Mood is mildly depressed  No sob or cough during interview   Assessment and Plan: Problem List Items Addressed This Visit      Cardiovascular and Mediastinum   Chronic pulmonary heart disease ([place])    Pt continues 3L 02  Wt loss has helped some         Respiratory   Acute on chronic respiratory failure with hypoxia (HCC)    Multifactorial  On 3L continuous 02  Breathing is improved with wt loss          Digestive   Thrush    Pt complains about mouth discomfort and sores since  being on abx Suspect thrush (  phone visit-not obs)  Px nystatin oral solution  inst to call if no improvement       Relevant Medications   clindamycin (CLEOCIN) 300 MG capsule   fluconazole (DIFLUCAN) 150 MG tablet   nystatin (MYCOSTATIN) 100000 UNIT/ML suspension     Endocrine   Diabetes type 2, controlled (HCC)    Very good control off metformin  Lab Results  Component Value Date   HGBA1C 5.2 10/07/2019   Some low glucose readings (has eaten less and lost wt)  Adv to hold glipizide Will continue to monitor Disc low glycemic diet  Taking statin  Unable to come in for foot exam and microalbumin at this time (home bound)       Hyperparathyroidism (HCC)    No clinical changes Lab Results  Component Value Date   CALCIUM 8.8 (L) 10/09/2019   PHOS 2.5 12/03/2017         Hypoglycemia    Cut out glipizide today  inst to alert [place] if no imp        Musculoskeletal and Integument   Pressure injury of skin    RLE S/p hospitalization for cellulitis  tx by home care/getting wound updates  Enc pt to keep wrapped/elevated and to try to walk with walker when able        Genitourinary   CKD (chronic kidney disease) stage 3, GFR 30-59 ml/min    Cr is 1.6 at d/c (improved but higher than baseline) Takes furosemide but not on a fluid restriction  Also Hb of 7.7 Now home/drinking more fluids and starting to get nutrition  Will plan on lab draw from home care next week  Most likely will need nephrology referral (pt in agreement)  Enc fluid as tolerate Stay off metformin  Watch glocuse control        Hematopoietic and Hemostatic   Coagulopathy (HCC)    Pt continues warfarin Lab Results  Component Value Date   INR 2.9 10/14/2019   INR 2.8 (H) 10/09/2019   INR 3.5 (H) 10/08/2019            Other   Morbid obesity (HCC)    Encouraged healthier eating and activity as tolerated  Significant wt loss over the past year      Absolute anemia    Has had iron def in  the past-continue nu iron  Hb 7.7 at last hospitalization  Suspect both iron def and anemia of chronic dz (from CKD)  Re check next week  No longer bleeding from legs (which was issue in the past)      Cellulitis of right lower extremity - Primary    Recent hospitalization with improvement Reviewed hospital records, lab results and studies in detail   Sent home with 7d of clindamycin Pt reports continued swelling and pain - (though has improved) Will extend the clindamycin for 7 more days (following INR closely)  Diflucan sent for yeast prn Continues home care with wound dressing and tx of lymphedema         Follow Up Instructions: Hold your glipizide Keep drinking water  I sent in another week of clindamycin for your leg Also diflucan-if needed Nystatin mouthwash for thrush  I will send an order to home care for INR this week and labs next week for anemia and kidney function    I discussed the assessment and treatment plan with the patient. The patient was provided an opportunity to ask questions and all were answered. The patient agreed with  the plan and demonstrated an understanding of the instructions.   The patient was advised to call back or seek an in-person evaluation if the symptoms worsen or if the condition fails to improve as anticipated.  I provided 30 minutes of non-face-to-face time during this encounter.   Roxy MannsMarne Devi Hopman, MD

## 2019-10-19 NOTE — Assessment & Plan Note (Signed)
RLE S/p hospitalization for cellulitis  tx by home care/getting wound updates  Enc pt to keep wrapped/elevated and to try to walk with walker when able

## 2019-10-19 NOTE — Assessment & Plan Note (Signed)
Pt continues warfarin Lab Results  Component Value Date   INR 2.9 10/14/2019   INR 2.8 (H) 10/09/2019   INR 3.5 (H) 10/08/2019

## 2019-10-19 NOTE — Assessment & Plan Note (Signed)
Very good control off metformin  Lab Results  Component Value Date   HGBA1C 5.2 10/07/2019   Some low glucose readings (has eaten less and lost wt)  Adv to hold glipizide Will continue to monitor Disc low glycemic diet  Taking statin  Unable to come in for foot exam and microalbumin at this time (home bound)

## 2019-10-19 NOTE — Assessment & Plan Note (Signed)
Multifactorial  On 3L continuous 02  Breathing is improved with wt loss

## 2019-10-21 ENCOUNTER — Telehealth (INDEPENDENT_AMBULATORY_CARE_PROVIDER_SITE_OTHER): Payer: Medicare HMO

## 2019-10-21 DIAGNOSIS — G4733 Obstructive sleep apnea (adult) (pediatric): Secondary | ICD-10-CM | POA: Diagnosis not present

## 2019-10-21 DIAGNOSIS — I480 Paroxysmal atrial fibrillation: Secondary | ICD-10-CM | POA: Diagnosis not present

## 2019-10-21 DIAGNOSIS — I5033 Acute on chronic diastolic (congestive) heart failure: Secondary | ICD-10-CM | POA: Diagnosis not present

## 2019-10-21 DIAGNOSIS — Z7901 Long term (current) use of anticoagulants: Secondary | ICD-10-CM

## 2019-10-21 DIAGNOSIS — J9621 Acute and chronic respiratory failure with hypoxia: Secondary | ICD-10-CM | POA: Diagnosis not present

## 2019-10-21 DIAGNOSIS — I279 Pulmonary heart disease, unspecified: Secondary | ICD-10-CM | POA: Diagnosis not present

## 2019-10-21 DIAGNOSIS — I4891 Unspecified atrial fibrillation: Secondary | ICD-10-CM | POA: Diagnosis not present

## 2019-10-21 DIAGNOSIS — L03115 Cellulitis of right lower limb: Secondary | ICD-10-CM | POA: Diagnosis not present

## 2019-10-21 DIAGNOSIS — E119 Type 2 diabetes mellitus without complications: Secondary | ICD-10-CM | POA: Diagnosis not present

## 2019-10-21 DIAGNOSIS — D509 Iron deficiency anemia, unspecified: Secondary | ICD-10-CM | POA: Diagnosis not present

## 2019-10-21 DIAGNOSIS — J449 Chronic obstructive pulmonary disease, unspecified: Secondary | ICD-10-CM | POA: Diagnosis not present

## 2019-10-21 LAB — POCT INR: INR: 3.1 — AB (ref 2.0–3.0)

## 2019-10-21 NOTE — Telephone Encounter (Signed)
10/20/18: VM from [place] that INR was 3.1 today, [phone]. Pt will take 1/2 tablet, 2.5mg , today, then continue current dosing of 5mg  M, W, F and 2.5mg  T, Th, S,Sun and retest in 2 wks. Pt will also eat one or two salads in the next 48 hours. Left detailed msg for [place] with instructions.   This was documented in an anticoag encounter also

## 2019-10-21 NOTE — Telephone Encounter (Signed)
Erica nurse with [facility] left v/m pts INR today is 3.1.  Erica request cb with what coumadin regimen is and when to recheck  INR. FYI to [employer].

## 2019-10-21 NOTE — Patient Instructions (Signed)
Pre visit review using our clinic review tool, if applicable. No additional management support is needed unless otherwise documented below in the visit note.  10/20/18: VM from [place] that INR was 3.1 today, [phone]. Pt will take 1/2 tablet, 2.5mg , today, then continue current dosing of 5mg  M, W, F and 2.5mg  T, Th, S,Sun and retest in 2 wks. Pt will also eat one or two salads in the next 48 hours. Left detailed msg for [place] with instructions.

## 2019-10-27 DIAGNOSIS — J9621 Acute and chronic respiratory failure with hypoxia: Secondary | ICD-10-CM | POA: Diagnosis not present

## 2019-10-27 DIAGNOSIS — L03115 Cellulitis of right lower limb: Secondary | ICD-10-CM | POA: Diagnosis not present

## 2019-10-27 DIAGNOSIS — I5033 Acute on chronic diastolic (congestive) heart failure: Secondary | ICD-10-CM | POA: Diagnosis not present

## 2019-10-27 DIAGNOSIS — E119 Type 2 diabetes mellitus without complications: Secondary | ICD-10-CM | POA: Diagnosis not present

## 2019-10-27 DIAGNOSIS — J449 Chronic obstructive pulmonary disease, unspecified: Secondary | ICD-10-CM | POA: Diagnosis not present

## 2019-10-27 DIAGNOSIS — I279 Pulmonary heart disease, unspecified: Secondary | ICD-10-CM | POA: Diagnosis not present

## 2019-10-27 DIAGNOSIS — I4891 Unspecified atrial fibrillation: Secondary | ICD-10-CM | POA: Diagnosis not present

## 2019-10-27 DIAGNOSIS — D509 Iron deficiency anemia, unspecified: Secondary | ICD-10-CM | POA: Diagnosis not present

## 2019-10-29 ENCOUNTER — Telehealth: Payer: Self-pay | Admitting: Family Medicine

## 2019-10-29 NOTE — Telephone Encounter (Signed)
Received labs from home care/ [facility]   Hb is up to 8.6 (was 7.7)  Ferritin is normal (iron stores)  Cr is 1.53 (down from 1.6) K and na are ok  Calcium is up to 10.4 (closer to her baseline)  While labs are mildly improved- I still think her kidney function is a problem  Anemia is most likely from this (since iron stores are ok)  Cr only improved slightly   I want to go ahead and refer her to nephrology (renal) for further eval  We discussed this at her last visit Let me know if agreeable and also ask how she is feeling

## 2019-10-29 NOTE — Telephone Encounter (Signed)
Let us know if she changes her mind - kidney health is very important and I do not want her to loose hers  Please schedule f/u in about 2 months   (in office preferred)

## 2019-10-29 NOTE — Telephone Encounter (Signed)
Pt notified of all lab results and Dr. Royden Purl comments. Pt said she "doesn't know" if she wants to see a nephrology, last time Dr. [name] put a referral in Oct pt declined to schedule appt and she said she still feels like she doesn't want an appt with nephrology, pt said she will "think about it" and if she changes her mind she will call back and let us know. Pt did say she is feeling okay, I asked if she has any issues she wanted me to let Dr. [name] know and she said "no". I did ask how is the cellulitis doing and she said it's improving, her swelling and redness is down, she still has "spots" she is treating but overall they are improving also she said they are not leaking fluid like they use to.

## 2019-10-30 NOTE — Telephone Encounter (Signed)
Pt notified of Dr. Royden Purl comments. Pt scheduled in office appt in 2 months

## 2019-11-02 ENCOUNTER — Telehealth: Payer: Self-pay | Admitting: Family Medicine

## 2019-11-02 NOTE — Telephone Encounter (Signed)
Please ask if her home care co ([facility]) can get a ua and culture please (if not ua at least a culture)  What was the last abx she took for uti?   I cannot find it  (most of her labs get scanned in)   Encourage water- I think she is good about drinking fluids

## 2019-11-02 NOTE — Telephone Encounter (Signed)
Patient stated that she just recently had a UTI and believes she may have another one Patient said he urine is very cloudy, she is having to urinate frequently and burning.  She would like to know if something could be called into her pharmacy for this since she was just treated for one recently

## 2019-11-02 NOTE — Telephone Encounter (Signed)
Thanks- I need a ua first before I can px - they will generally call or fax it  Thanks

## 2019-11-02 NOTE — Telephone Encounter (Signed)
Called [facility] and they will do a clean catch UA, last UA/ Cx they did was on 07/13/19 and per phone note pt was given Cipro 250 mg

## 2019-11-04 ENCOUNTER — Ambulatory Visit (INDEPENDENT_AMBULATORY_CARE_PROVIDER_SITE_OTHER): Payer: Medicare HMO

## 2019-11-04 ENCOUNTER — Encounter: Payer: Self-pay | Admitting: Family Medicine

## 2019-11-04 DIAGNOSIS — D509 Iron deficiency anemia, unspecified: Secondary | ICD-10-CM | POA: Diagnosis not present

## 2019-11-04 DIAGNOSIS — L03115 Cellulitis of right lower limb: Secondary | ICD-10-CM | POA: Diagnosis not present

## 2019-11-04 DIAGNOSIS — Z7901 Long term (current) use of anticoagulants: Secondary | ICD-10-CM | POA: Diagnosis not present

## 2019-11-04 DIAGNOSIS — S70311D Abrasion, right thigh, subsequent encounter: Secondary | ICD-10-CM | POA: Diagnosis not present

## 2019-11-04 DIAGNOSIS — N390 Urinary tract infection, site not specified: Secondary | ICD-10-CM | POA: Diagnosis not present

## 2019-11-04 DIAGNOSIS — J449 Chronic obstructive pulmonary disease, unspecified: Secondary | ICD-10-CM | POA: Diagnosis not present

## 2019-11-04 DIAGNOSIS — I279 Pulmonary heart disease, unspecified: Secondary | ICD-10-CM | POA: Diagnosis not present

## 2019-11-04 DIAGNOSIS — S70312D Abrasion, left thigh, subsequent encounter: Secondary | ICD-10-CM | POA: Diagnosis not present

## 2019-11-04 DIAGNOSIS — J9611 Chronic respiratory failure with hypoxia: Secondary | ICD-10-CM | POA: Diagnosis not present

## 2019-11-04 DIAGNOSIS — I5032 Chronic diastolic (congestive) heart failure: Secondary | ICD-10-CM | POA: Diagnosis not present

## 2019-11-04 DIAGNOSIS — E119 Type 2 diabetes mellitus without complications: Secondary | ICD-10-CM | POA: Diagnosis not present

## 2019-11-04 LAB — POCT INR: INR: 3.1 — AB (ref 2.0–3.0)

## 2019-11-04 NOTE — Patient Instructions (Signed)
Pre visit review using our clinic review tool, if applicable. No additional management support is needed unless otherwise documented below in the visit note. 

## 2019-11-05 ENCOUNTER — Telehealth: Payer: Self-pay | Admitting: Family Medicine

## 2019-11-05 NOTE — Telephone Encounter (Signed)
Received a call back from [facility], Larita Fife called patient and Jordanna declined the Nephrology consult. She said she would leave the Referral in place for 2 weeks if you can  change the patients mind about scheduling. Larita Fife said that they are opening up a Satellite office in [place] that hasnt been announced yet and maybe your patient would accept an appointment at that  office. Will need to call Larita Fife back if patient agrees and then she will have to call her to schedule.

## 2019-11-06 ENCOUNTER — Telehealth: Payer: Self-pay | Admitting: Family Medicine

## 2019-11-06 NOTE — Telephone Encounter (Signed)
I have the urinalysis but still waiting on the culture  There are some wbc-that can be from infection or contamination-the culture will verify

## 2019-11-06 NOTE — Telephone Encounter (Signed)
Pt notified of Dr. [name]'s comments and verbalized understanding  

## 2019-11-06 NOTE — Telephone Encounter (Signed)
Patient would like to know if her lab results have returned Please advise

## 2019-11-09 ENCOUNTER — Other Ambulatory Visit: Payer: Self-pay | Admitting: Family Medicine

## 2019-11-09 DIAGNOSIS — S70312D Abrasion, left thigh, subsequent encounter: Secondary | ICD-10-CM | POA: Diagnosis not present

## 2019-11-09 DIAGNOSIS — I5032 Chronic diastolic (congestive) heart failure: Secondary | ICD-10-CM

## 2019-11-09 DIAGNOSIS — Z6841 Body Mass Index (BMI) 40.0 and over, adult: Secondary | ICD-10-CM

## 2019-11-09 DIAGNOSIS — Z5181 Encounter for therapeutic drug level monitoring: Secondary | ICD-10-CM

## 2019-11-09 DIAGNOSIS — I279 Pulmonary heart disease, unspecified: Secondary | ICD-10-CM

## 2019-11-09 DIAGNOSIS — E119 Type 2 diabetes mellitus without complications: Secondary | ICD-10-CM

## 2019-11-09 DIAGNOSIS — I4891 Unspecified atrial fibrillation: Secondary | ICD-10-CM

## 2019-11-09 DIAGNOSIS — Z7901 Long term (current) use of anticoagulants: Secondary | ICD-10-CM

## 2019-11-09 DIAGNOSIS — L03115 Cellulitis of right lower limb: Secondary | ICD-10-CM | POA: Diagnosis not present

## 2019-11-09 DIAGNOSIS — Z9981 Dependence on supplemental oxygen: Secondary | ICD-10-CM

## 2019-11-09 DIAGNOSIS — J449 Chronic obstructive pulmonary disease, unspecified: Secondary | ICD-10-CM

## 2019-11-09 DIAGNOSIS — Z7984 Long term (current) use of oral hypoglycemic drugs: Secondary | ICD-10-CM

## 2019-11-09 DIAGNOSIS — S70311D Abrasion, right thigh, subsequent encounter: Secondary | ICD-10-CM | POA: Diagnosis not present

## 2019-11-09 DIAGNOSIS — J9611 Chronic respiratory failure with hypoxia: Secondary | ICD-10-CM | POA: Diagnosis not present

## 2019-11-09 DIAGNOSIS — D509 Iron deficiency anemia, unspecified: Secondary | ICD-10-CM

## 2019-11-09 DIAGNOSIS — Z87891 Personal history of nicotine dependence: Secondary | ICD-10-CM

## 2019-11-09 MED ORDER — CEPHALEXIN 500 MG PO CAPS: 500.0000 mg | ORAL_CAPSULE | Freq: Two times a day (BID) | ORAL | 0 refills | Status: DC

## 2019-11-09 NOTE — Telephone Encounter (Signed)
Urine culture returned showing Klebsiella  It is sensitive to most antibiotics  Please send pended px to her pharmacy for keflex  If no imp in symptoms with medicine let me know Keep up a good water intake

## 2019-11-09 NOTE — Telephone Encounter (Signed)
Pt notified of urine cx and Dr. Royden Purl comments. No know allergic reaction to keflex so Rx sent to pharmacy and pt aware

## 2019-11-09 NOTE — Telephone Encounter (Signed)
I think she has taken keflex before , if she thinks she has ever had a reaction or side effect let me know  It is safer for her kidneys than some other medications

## 2019-11-10 DIAGNOSIS — J9620 Acute and chronic respiratory failure, unspecified whether with hypoxia or hypercapnia: Secondary | ICD-10-CM | POA: Diagnosis not present

## 2019-11-10 DIAGNOSIS — G4733 Obstructive sleep apnea (adult) (pediatric): Secondary | ICD-10-CM | POA: Diagnosis not present

## 2019-11-10 DIAGNOSIS — J449 Chronic obstructive pulmonary disease, unspecified: Secondary | ICD-10-CM | POA: Diagnosis not present

## 2019-11-12 ENCOUNTER — Telehealth: Payer: Self-pay

## 2019-11-12 DIAGNOSIS — I279 Pulmonary heart disease, unspecified: Secondary | ICD-10-CM | POA: Diagnosis not present

## 2019-11-12 DIAGNOSIS — S70312D Abrasion, left thigh, subsequent encounter: Secondary | ICD-10-CM | POA: Diagnosis not present

## 2019-11-12 DIAGNOSIS — S70311D Abrasion, right thigh, subsequent encounter: Secondary | ICD-10-CM | POA: Diagnosis not present

## 2019-11-12 DIAGNOSIS — J449 Chronic obstructive pulmonary disease, unspecified: Secondary | ICD-10-CM | POA: Diagnosis not present

## 2019-11-12 DIAGNOSIS — L608 Other nail disorders: Secondary | ICD-10-CM

## 2019-11-12 DIAGNOSIS — L03115 Cellulitis of right lower limb: Secondary | ICD-10-CM | POA: Diagnosis not present

## 2019-11-12 DIAGNOSIS — J9611 Chronic respiratory failure with hypoxia: Secondary | ICD-10-CM | POA: Diagnosis not present

## 2019-11-12 DIAGNOSIS — E1121 Type 2 diabetes mellitus with diabetic nephropathy: Secondary | ICD-10-CM

## 2019-11-12 DIAGNOSIS — I5032 Chronic diastolic (congestive) heart failure: Secondary | ICD-10-CM | POA: Diagnosis not present

## 2019-11-12 DIAGNOSIS — D509 Iron deficiency anemia, unspecified: Secondary | ICD-10-CM | POA: Diagnosis not present

## 2019-11-12 DIAGNOSIS — E119 Type 2 diabetes mellitus without complications: Secondary | ICD-10-CM | POA: Diagnosis not present

## 2019-11-12 NOTE — Telephone Encounter (Signed)
Referral done Unsure if any podiatrists in the area do house calls or if pt can leave house with help if necessary ?  She has gone to the hospital before  Will route to [facility]

## 2019-11-12 NOTE — Telephone Encounter (Signed)
[place] nurse with [facility] request podiatrist referral with a podiatrist that will make house call. All of pts toenails are very long, hard, discolored, and hurting. As FYI wounds on legs have healed except wound on thigh which is still healing.

## 2019-11-13 DIAGNOSIS — J9621 Acute and chronic respiratory failure with hypoxia: Secondary | ICD-10-CM | POA: Diagnosis not present

## 2019-11-13 DIAGNOSIS — M6281 Muscle weakness (generalized): Secondary | ICD-10-CM | POA: Diagnosis not present

## 2019-11-13 DIAGNOSIS — R2689 Other abnormalities of gait and mobility: Secondary | ICD-10-CM | POA: Diagnosis not present

## 2019-11-17 ENCOUNTER — Other Ambulatory Visit: Payer: Self-pay | Admitting: Family Medicine

## 2019-11-17 NOTE — Telephone Encounter (Signed)
Last filled on 10/16/18 #90 g with 0 refills, please advise

## 2019-11-18 ENCOUNTER — Ambulatory Visit (INDEPENDENT_AMBULATORY_CARE_PROVIDER_SITE_OTHER): Payer: Medicare HMO

## 2019-11-18 DIAGNOSIS — L03115 Cellulitis of right lower limb: Secondary | ICD-10-CM | POA: Diagnosis not present

## 2019-11-18 DIAGNOSIS — I279 Pulmonary heart disease, unspecified: Secondary | ICD-10-CM | POA: Diagnosis not present

## 2019-11-18 DIAGNOSIS — E119 Type 2 diabetes mellitus without complications: Secondary | ICD-10-CM | POA: Diagnosis not present

## 2019-11-18 DIAGNOSIS — I5032 Chronic diastolic (congestive) heart failure: Secondary | ICD-10-CM | POA: Diagnosis not present

## 2019-11-18 DIAGNOSIS — S70312D Abrasion, left thigh, subsequent encounter: Secondary | ICD-10-CM | POA: Diagnosis not present

## 2019-11-18 DIAGNOSIS — Z7901 Long term (current) use of anticoagulants: Secondary | ICD-10-CM | POA: Diagnosis not present

## 2019-11-18 DIAGNOSIS — J9611 Chronic respiratory failure with hypoxia: Secondary | ICD-10-CM | POA: Diagnosis not present

## 2019-11-18 DIAGNOSIS — D509 Iron deficiency anemia, unspecified: Secondary | ICD-10-CM | POA: Diagnosis not present

## 2019-11-18 DIAGNOSIS — S70311D Abrasion, right thigh, subsequent encounter: Secondary | ICD-10-CM | POA: Diagnosis not present

## 2019-11-18 DIAGNOSIS — J449 Chronic obstructive pulmonary disease, unspecified: Secondary | ICD-10-CM | POA: Diagnosis not present

## 2019-11-18 LAB — POCT INR: INR: 1.6 — AB (ref 2.0–3.0)

## 2019-11-18 NOTE — Patient Instructions (Addendum)
Pre visit review using our clinic review tool, if applicable. No additional management support is needed unless otherwise documented below in the visit note.  Received call from Traci, [facility] nurse, with INR of 1.6 today. [phone] She had pt with her. Recent change was pt finished keflex, 500mg  BID. Verified dosing on calendar. Advised to increase dose today to 7.5mg  and increase dose tomorrow to 5mg ,  then resume regular dosing of 5mg  M, W, F and take  2.5mg  all other days. Recheck in 2 wks. Both pt and Traci verbalized understanding.

## 2019-11-19 DIAGNOSIS — G4733 Obstructive sleep apnea (adult) (pediatric): Secondary | ICD-10-CM | POA: Diagnosis not present

## 2019-11-20 DIAGNOSIS — G4733 Obstructive sleep apnea (adult) (pediatric): Secondary | ICD-10-CM | POA: Diagnosis not present

## 2019-11-24 ENCOUNTER — Telehealth: Payer: Self-pay | Admitting: Family Medicine

## 2019-11-24 NOTE — Telephone Encounter (Signed)
Are symptoms partially improved or not improved at all?  Can her home care co get a urine cx going before we start anything else?

## 2019-11-24 NOTE — Telephone Encounter (Signed)
Called [facility] and gave a verbal order for a UA and Cx, called pt and she said she's having the exact same sxs as last time. Pt said the last time we sent an abx in for her UTI her sxs resolved completely and they were gone for a while but recently returned. Pt is okay waiting for the UA and Cx results from [facility] to be done 1st before we treat her

## 2019-11-24 NOTE — Telephone Encounter (Signed)
Please verbally ok for ua and CX  I want to get a result first to see if this is the same bug or different  We are limited re: choices due to her kidney function

## 2019-11-24 NOTE — Telephone Encounter (Signed)
Pt called stating she finished her antibiotic for UTI and stated she still is having some symptoms and wanted to know if you could call her in more antibiotic.    [facility] [address]

## 2019-11-25 ENCOUNTER — Other Ambulatory Visit: Payer: Self-pay | Admitting: Family Medicine

## 2019-11-25 DIAGNOSIS — Z7901 Long term (current) use of anticoagulants: Secondary | ICD-10-CM

## 2019-11-26 DIAGNOSIS — I5032 Chronic diastolic (congestive) heart failure: Secondary | ICD-10-CM | POA: Diagnosis not present

## 2019-11-26 DIAGNOSIS — J9611 Chronic respiratory failure with hypoxia: Secondary | ICD-10-CM | POA: Diagnosis not present

## 2019-11-26 DIAGNOSIS — D509 Iron deficiency anemia, unspecified: Secondary | ICD-10-CM | POA: Diagnosis not present

## 2019-11-26 DIAGNOSIS — E119 Type 2 diabetes mellitus without complications: Secondary | ICD-10-CM | POA: Diagnosis not present

## 2019-11-26 DIAGNOSIS — S70312D Abrasion, left thigh, subsequent encounter: Secondary | ICD-10-CM | POA: Diagnosis not present

## 2019-11-26 DIAGNOSIS — J449 Chronic obstructive pulmonary disease, unspecified: Secondary | ICD-10-CM | POA: Diagnosis not present

## 2019-11-26 DIAGNOSIS — I279 Pulmonary heart disease, unspecified: Secondary | ICD-10-CM | POA: Diagnosis not present

## 2019-11-26 DIAGNOSIS — S70311D Abrasion, right thigh, subsequent encounter: Secondary | ICD-10-CM | POA: Diagnosis not present

## 2019-11-26 DIAGNOSIS — N390 Urinary tract infection, site not specified: Secondary | ICD-10-CM | POA: Diagnosis not present

## 2019-11-26 DIAGNOSIS — L03115 Cellulitis of right lower limb: Secondary | ICD-10-CM | POA: Diagnosis not present

## 2019-11-26 NOTE — Telephone Encounter (Signed)
Pt compliant with coumadin management. Sent in refill. 

## 2019-11-30 ENCOUNTER — Telehealth: Payer: Self-pay | Admitting: Family Medicine

## 2019-11-30 MED ORDER — CEPHALEXIN 500 MG PO CAPS: 500.0000 mg | ORAL_CAPSULE | Freq: Two times a day (BID) | ORAL | 0 refills | Status: DC

## 2019-11-30 NOTE — Telephone Encounter (Signed)
I just rec her urine cx- potitive for e coli this time (last one was klebsiella)  I sent keflex (should work for this) to her pharmacy   If no improvement we need to re cx urine after tx

## 2019-11-30 NOTE — Telephone Encounter (Signed)
UA does look positive - urine culture is pending (hopefully soon as the ua was collected on 2/25)  I will make choice re: abx when that returns

## 2019-12-01 ENCOUNTER — Ambulatory Visit (INDEPENDENT_AMBULATORY_CARE_PROVIDER_SITE_OTHER): Payer: Medicare HMO

## 2019-12-01 DIAGNOSIS — D509 Iron deficiency anemia, unspecified: Secondary | ICD-10-CM | POA: Diagnosis not present

## 2019-12-01 DIAGNOSIS — Z7901 Long term (current) use of anticoagulants: Secondary | ICD-10-CM

## 2019-12-01 DIAGNOSIS — I5032 Chronic diastolic (congestive) heart failure: Secondary | ICD-10-CM | POA: Diagnosis not present

## 2019-12-01 DIAGNOSIS — S70311D Abrasion, right thigh, subsequent encounter: Secondary | ICD-10-CM | POA: Diagnosis not present

## 2019-12-01 DIAGNOSIS — J449 Chronic obstructive pulmonary disease, unspecified: Secondary | ICD-10-CM | POA: Diagnosis not present

## 2019-12-01 DIAGNOSIS — L03115 Cellulitis of right lower limb: Secondary | ICD-10-CM | POA: Diagnosis not present

## 2019-12-01 DIAGNOSIS — J9611 Chronic respiratory failure with hypoxia: Secondary | ICD-10-CM | POA: Diagnosis not present

## 2019-12-01 DIAGNOSIS — E119 Type 2 diabetes mellitus without complications: Secondary | ICD-10-CM | POA: Diagnosis not present

## 2019-12-01 DIAGNOSIS — I279 Pulmonary heart disease, unspecified: Secondary | ICD-10-CM | POA: Diagnosis not present

## 2019-12-01 DIAGNOSIS — S70312D Abrasion, left thigh, subsequent encounter: Secondary | ICD-10-CM | POA: Diagnosis not present

## 2019-12-01 LAB — POCT INR: INR: 2.6 (ref 2.0–3.0)

## 2019-12-01 NOTE — Telephone Encounter (Signed)
Pt notified of urine cx results and Dr. Royden Purl comments, pt will go get abx and f/u if needed

## 2019-12-01 NOTE — Patient Instructions (Addendum)
Pre visit review using our clinic review tool, if applicable. No additional management support is needed unless otherwise documented below in the visit note.  Received call from Traci, [facility] nurse, with INR of 2.7 today. [phone] She had pt with her. Recent change are that pt will start cipro today for 7 days. Verified dosing on calendar. Advised no changes and continue regular dosing of 5mg  M, W, F and take  2.5mg  all other days. Recheck in 2 wks. Both pt and Traci verbalized understanding.

## 2019-12-08 DIAGNOSIS — I5032 Chronic diastolic (congestive) heart failure: Secondary | ICD-10-CM | POA: Diagnosis not present

## 2019-12-08 DIAGNOSIS — J9611 Chronic respiratory failure with hypoxia: Secondary | ICD-10-CM | POA: Diagnosis not present

## 2019-12-08 DIAGNOSIS — I279 Pulmonary heart disease, unspecified: Secondary | ICD-10-CM | POA: Diagnosis not present

## 2019-12-08 DIAGNOSIS — S70312D Abrasion, left thigh, subsequent encounter: Secondary | ICD-10-CM | POA: Diagnosis not present

## 2019-12-08 DIAGNOSIS — E119 Type 2 diabetes mellitus without complications: Secondary | ICD-10-CM | POA: Diagnosis not present

## 2019-12-08 DIAGNOSIS — S70311D Abrasion, right thigh, subsequent encounter: Secondary | ICD-10-CM | POA: Diagnosis not present

## 2019-12-08 DIAGNOSIS — D509 Iron deficiency anemia, unspecified: Secondary | ICD-10-CM | POA: Diagnosis not present

## 2019-12-08 DIAGNOSIS — L03115 Cellulitis of right lower limb: Secondary | ICD-10-CM | POA: Diagnosis not present

## 2019-12-08 DIAGNOSIS — J449 Chronic obstructive pulmonary disease, unspecified: Secondary | ICD-10-CM | POA: Diagnosis not present

## 2019-12-09 ENCOUNTER — Telehealth: Payer: Self-pay | Admitting: *Deleted

## 2019-12-09 DIAGNOSIS — S70312D Abrasion, left thigh, subsequent encounter: Secondary | ICD-10-CM | POA: Diagnosis not present

## 2019-12-09 DIAGNOSIS — I279 Pulmonary heart disease, unspecified: Secondary | ICD-10-CM | POA: Diagnosis not present

## 2019-12-09 DIAGNOSIS — D509 Iron deficiency anemia, unspecified: Secondary | ICD-10-CM | POA: Diagnosis not present

## 2019-12-09 DIAGNOSIS — J449 Chronic obstructive pulmonary disease, unspecified: Secondary | ICD-10-CM | POA: Diagnosis not present

## 2019-12-09 DIAGNOSIS — J9611 Chronic respiratory failure with hypoxia: Secondary | ICD-10-CM | POA: Diagnosis not present

## 2019-12-09 DIAGNOSIS — L03115 Cellulitis of right lower limb: Secondary | ICD-10-CM | POA: Diagnosis not present

## 2019-12-09 DIAGNOSIS — S70311D Abrasion, right thigh, subsequent encounter: Secondary | ICD-10-CM | POA: Diagnosis not present

## 2019-12-09 DIAGNOSIS — I5032 Chronic diastolic (congestive) heart failure: Secondary | ICD-10-CM | POA: Diagnosis not present

## 2019-12-09 DIAGNOSIS — E119 Type 2 diabetes mellitus without complications: Secondary | ICD-10-CM | POA: Diagnosis not present

## 2019-12-09 MED ORDER — FLUCONAZOLE 150 MG PO TABS: 150.0000 mg | ORAL_TABLET | Freq: Once | ORAL | 0 refills | Status: AC

## 2019-12-09 NOTE — Telephone Encounter (Signed)
Get a ua and C and S please   I cannot diagnose a yeast infection w/o seeing her but is she having any vaginal d/c (what color)? And where is her itching ?

## 2019-12-09 NOTE — Telephone Encounter (Signed)
[facility] Nurse left a voicemail stating that she is out to see the patient today. French Ana stated that the patient finished her Keflex for a UTI yesterday. French Ana stated that patient is still complaining of burning with urination and itching. French Ana stated that patient states that she burns at the end of urinating. French Ana stated that patient is having signs of a UTI and possible yeast infection.

## 2019-12-09 NOTE — Telephone Encounter (Signed)
I sent diflucan to her pharmacy for yeast -let us know if it helps  This can interfere with warfarin but we are only giving one dose   When is her next PT/INR (she gets from home care) , thanks

## 2019-12-09 NOTE — Telephone Encounter (Signed)
French Ana notified of Dr. Royden Purl comments. They are not able to do a wet prep or help in any way with the possible yeast inf. They said that pt is just having vaginal itching, no discharge they are aware of but giving multiple abx they feel that pt is developing a yeast infection, her only sxs now is dysuria right at the end of urinating and vaginal itching. They will get a U/A and cx. They said the earliest they can get that is either tomorrow or Friday at the latest and will fax the result once they get them

## 2019-12-09 NOTE — Telephone Encounter (Signed)
Pt notified Rx sent and advised of Dr. Royden Purl comments. Pt said they are checking her coumadin next week when Home Health comes back.

## 2019-12-10 DIAGNOSIS — N390 Urinary tract infection, site not specified: Secondary | ICD-10-CM | POA: Diagnosis not present

## 2019-12-10 DIAGNOSIS — S70312D Abrasion, left thigh, subsequent encounter: Secondary | ICD-10-CM | POA: Diagnosis not present

## 2019-12-10 DIAGNOSIS — D509 Iron deficiency anemia, unspecified: Secondary | ICD-10-CM | POA: Diagnosis not present

## 2019-12-10 DIAGNOSIS — I5032 Chronic diastolic (congestive) heart failure: Secondary | ICD-10-CM | POA: Diagnosis not present

## 2019-12-10 DIAGNOSIS — S70311D Abrasion, right thigh, subsequent encounter: Secondary | ICD-10-CM | POA: Diagnosis not present

## 2019-12-10 DIAGNOSIS — J449 Chronic obstructive pulmonary disease, unspecified: Secondary | ICD-10-CM | POA: Diagnosis not present

## 2019-12-10 DIAGNOSIS — E119 Type 2 diabetes mellitus without complications: Secondary | ICD-10-CM | POA: Diagnosis not present

## 2019-12-10 DIAGNOSIS — J9611 Chronic respiratory failure with hypoxia: Secondary | ICD-10-CM | POA: Diagnosis not present

## 2019-12-10 DIAGNOSIS — I279 Pulmonary heart disease, unspecified: Secondary | ICD-10-CM | POA: Diagnosis not present

## 2019-12-10 DIAGNOSIS — L03115 Cellulitis of right lower limb: Secondary | ICD-10-CM | POA: Diagnosis not present

## 2019-12-11 ENCOUNTER — Other Ambulatory Visit: Payer: Self-pay | Admitting: Family Medicine

## 2019-12-11 DIAGNOSIS — S70312D Abrasion, left thigh, subsequent encounter: Secondary | ICD-10-CM | POA: Diagnosis not present

## 2019-12-11 DIAGNOSIS — S70311D Abrasion, right thigh, subsequent encounter: Secondary | ICD-10-CM | POA: Diagnosis not present

## 2019-12-11 DIAGNOSIS — R2689 Other abnormalities of gait and mobility: Secondary | ICD-10-CM | POA: Diagnosis not present

## 2019-12-11 DIAGNOSIS — E119 Type 2 diabetes mellitus without complications: Secondary | ICD-10-CM | POA: Diagnosis not present

## 2019-12-11 DIAGNOSIS — M6281 Muscle weakness (generalized): Secondary | ICD-10-CM | POA: Diagnosis not present

## 2019-12-11 DIAGNOSIS — J9621 Acute and chronic respiratory failure with hypoxia: Secondary | ICD-10-CM | POA: Diagnosis not present

## 2019-12-11 DIAGNOSIS — D509 Iron deficiency anemia, unspecified: Secondary | ICD-10-CM | POA: Diagnosis not present

## 2019-12-11 DIAGNOSIS — J9611 Chronic respiratory failure with hypoxia: Secondary | ICD-10-CM | POA: Diagnosis not present

## 2019-12-11 DIAGNOSIS — I279 Pulmonary heart disease, unspecified: Secondary | ICD-10-CM | POA: Diagnosis not present

## 2019-12-11 DIAGNOSIS — I5032 Chronic diastolic (congestive) heart failure: Secondary | ICD-10-CM | POA: Diagnosis not present

## 2019-12-11 DIAGNOSIS — L03115 Cellulitis of right lower limb: Secondary | ICD-10-CM | POA: Diagnosis not present

## 2019-12-11 DIAGNOSIS — J449 Chronic obstructive pulmonary disease, unspecified: Secondary | ICD-10-CM | POA: Diagnosis not present

## 2019-12-14 ENCOUNTER — Telehealth: Payer: Self-pay | Admitting: Family Medicine

## 2019-12-14 ENCOUNTER — Ambulatory Visit (INDEPENDENT_AMBULATORY_CARE_PROVIDER_SITE_OTHER): Payer: Medicare HMO

## 2019-12-14 DIAGNOSIS — S70312D Abrasion, left thigh, subsequent encounter: Secondary | ICD-10-CM | POA: Diagnosis not present

## 2019-12-14 DIAGNOSIS — I279 Pulmonary heart disease, unspecified: Secondary | ICD-10-CM | POA: Diagnosis not present

## 2019-12-14 DIAGNOSIS — D509 Iron deficiency anemia, unspecified: Secondary | ICD-10-CM | POA: Diagnosis not present

## 2019-12-14 DIAGNOSIS — J449 Chronic obstructive pulmonary disease, unspecified: Secondary | ICD-10-CM | POA: Diagnosis not present

## 2019-12-14 DIAGNOSIS — S70311D Abrasion, right thigh, subsequent encounter: Secondary | ICD-10-CM | POA: Diagnosis not present

## 2019-12-14 DIAGNOSIS — Z7901 Long term (current) use of anticoagulants: Secondary | ICD-10-CM

## 2019-12-14 DIAGNOSIS — J9611 Chronic respiratory failure with hypoxia: Secondary | ICD-10-CM | POA: Diagnosis not present

## 2019-12-14 DIAGNOSIS — I480 Paroxysmal atrial fibrillation: Secondary | ICD-10-CM | POA: Diagnosis not present

## 2019-12-14 DIAGNOSIS — E119 Type 2 diabetes mellitus without complications: Secondary | ICD-10-CM | POA: Diagnosis not present

## 2019-12-14 DIAGNOSIS — L03115 Cellulitis of right lower limb: Secondary | ICD-10-CM | POA: Diagnosis not present

## 2019-12-14 DIAGNOSIS — I5032 Chronic diastolic (congestive) heart failure: Secondary | ICD-10-CM | POA: Diagnosis not present

## 2019-12-14 LAB — POCT INR: INR: 1.6 — AB (ref 2.0–3.0)

## 2019-12-14 MED ORDER — CIPROFLOXACIN HCL 250 MG PO TABS: 250.0000 mg | ORAL_TABLET | Freq: Two times a day (BID) | ORAL | 0 refills | Status: DC

## 2019-12-14 MED ORDER — FLUCONAZOLE 150 MG PO TABS: 150.0000 mg | ORAL_TABLET | Freq: Once | ORAL | 0 refills | Status: AC

## 2019-12-14 NOTE — Telephone Encounter (Signed)
Urine culture showed greater than 2 organisms/possible contamination  Need to re collect for cx please  How are her symptoms ?

## 2019-12-14 NOTE — Progress Notes (Signed)
Marisue Ivan also reported pt had recent UA and it was faxed to the office. Urine culture should be back today or tomorrow and it will also be faxed.

## 2019-12-14 NOTE — Telephone Encounter (Signed)
Called pt and advised pt of urine cx and Dr. Royden Purl comments. Pt said her sxs are "pretty bad" pt said she has sever dysuria, urgency, and she is having frequency so bad she is going to the bathroom almost every 10 min or so. Pt is requesting an abx sent to [facility] [address] She also states that if Dr. [name] sends in a abx she needs another diflucan tablet sent in due to yeast inf.  Called [facility], talked to the "after hrs" rep (after 5pm) and she took the verbal order and said she will let their on call nurse know that Dr. [name] has given a verbal order for another U/A and Cx with sensitivities

## 2019-12-14 NOTE — Telephone Encounter (Signed)
I sent in 5 d course of cipro (low dose due to renal insufficiency) If no improvement/resolution we need to get her to urology   I am worried that these infections are becoming untreatable and this could further impair her renal function   Both the cipro and the diflucan can affect INR so I want to re check it sometimes this week

## 2019-12-14 NOTE — Patient Instructions (Addendum)
Pre visit review using our clinic review tool, if applicable. No additional management support is needed unless otherwise documented below in the visit note.  Received call from Marisue Ivan [facility] at [phone] with INR of 1.6 She had pt with her. Verified dosing on calendar. Advised to increase dose today to 7.5mg  and increase dose tomorrow to 5.0mg  then continue normal dosing of 2.5mg  daily, except 5mg  M, W, F. Recheck in 2 wks. Both pt and Traci verbalized understanding.

## 2019-12-15 NOTE — Telephone Encounter (Signed)
Pt notified of Dr. Royden Purl comments and that Rxs was sent to pharmacy. Pt said she will check INR on Friday and call us with the results

## 2019-12-16 DIAGNOSIS — L03115 Cellulitis of right lower limb: Secondary | ICD-10-CM | POA: Diagnosis not present

## 2019-12-16 DIAGNOSIS — N390 Urinary tract infection, site not specified: Secondary | ICD-10-CM | POA: Diagnosis not present

## 2019-12-16 DIAGNOSIS — I5032 Chronic diastolic (congestive) heart failure: Secondary | ICD-10-CM | POA: Diagnosis not present

## 2019-12-16 DIAGNOSIS — S70312D Abrasion, left thigh, subsequent encounter: Secondary | ICD-10-CM | POA: Diagnosis not present

## 2019-12-16 DIAGNOSIS — J9611 Chronic respiratory failure with hypoxia: Secondary | ICD-10-CM | POA: Diagnosis not present

## 2019-12-16 DIAGNOSIS — I279 Pulmonary heart disease, unspecified: Secondary | ICD-10-CM | POA: Diagnosis not present

## 2019-12-16 DIAGNOSIS — S70311D Abrasion, right thigh, subsequent encounter: Secondary | ICD-10-CM | POA: Diagnosis not present

## 2019-12-16 DIAGNOSIS — E119 Type 2 diabetes mellitus without complications: Secondary | ICD-10-CM | POA: Diagnosis not present

## 2019-12-16 DIAGNOSIS — D509 Iron deficiency anemia, unspecified: Secondary | ICD-10-CM | POA: Diagnosis not present

## 2019-12-16 DIAGNOSIS — J449 Chronic obstructive pulmonary disease, unspecified: Secondary | ICD-10-CM | POA: Diagnosis not present

## 2019-12-17 DIAGNOSIS — G4733 Obstructive sleep apnea (adult) (pediatric): Secondary | ICD-10-CM | POA: Diagnosis not present

## 2019-12-18 ENCOUNTER — Ambulatory Visit (INDEPENDENT_AMBULATORY_CARE_PROVIDER_SITE_OTHER): Payer: Medicare HMO

## 2019-12-18 DIAGNOSIS — Z7901 Long term (current) use of anticoagulants: Secondary | ICD-10-CM

## 2019-12-18 LAB — POCT INR: INR: 2.5 (ref 2.0–3.0)

## 2019-12-18 NOTE — Patient Instructions (Addendum)
Pre visit review using our clinic review tool, if applicable. No additional management support is needed unless otherwise documented below in the visit note.  Received fax from [employer] with result of 2.5. Left VM for Marisue Ivan [facility] at [phone] with instructions. Contacted pt and advised below directions. Pt verbalized understanding.  Continue normal dosing of 2.5mg  daily, except 5mg  M, W, F. Recheck in 2 wks. Both pt and Traci verbalized understanding.

## 2019-12-21 DIAGNOSIS — J9611 Chronic respiratory failure with hypoxia: Secondary | ICD-10-CM | POA: Diagnosis not present

## 2019-12-21 DIAGNOSIS — L03115 Cellulitis of right lower limb: Secondary | ICD-10-CM | POA: Diagnosis not present

## 2019-12-21 DIAGNOSIS — E119 Type 2 diabetes mellitus without complications: Secondary | ICD-10-CM | POA: Diagnosis not present

## 2019-12-21 DIAGNOSIS — I5032 Chronic diastolic (congestive) heart failure: Secondary | ICD-10-CM | POA: Diagnosis not present

## 2019-12-21 DIAGNOSIS — J449 Chronic obstructive pulmonary disease, unspecified: Secondary | ICD-10-CM | POA: Diagnosis not present

## 2019-12-21 DIAGNOSIS — G4733 Obstructive sleep apnea (adult) (pediatric): Secondary | ICD-10-CM | POA: Diagnosis not present

## 2019-12-21 DIAGNOSIS — D509 Iron deficiency anemia, unspecified: Secondary | ICD-10-CM | POA: Diagnosis not present

## 2019-12-21 DIAGNOSIS — S70311D Abrasion, right thigh, subsequent encounter: Secondary | ICD-10-CM | POA: Diagnosis not present

## 2019-12-21 DIAGNOSIS — S70312D Abrasion, left thigh, subsequent encounter: Secondary | ICD-10-CM | POA: Diagnosis not present

## 2019-12-21 DIAGNOSIS — I279 Pulmonary heart disease, unspecified: Secondary | ICD-10-CM | POA: Diagnosis not present

## 2019-12-22 ENCOUNTER — Telehealth: Payer: Self-pay

## 2019-12-22 NOTE — Telephone Encounter (Signed)
Byrd Hesselbach with [facility] left v/m requesting cb. I called back and spoke with Lice who said they needed to speak with pt about recertification for O2 and they had pts contact # and would call pt. Nothing further needed from [facility]

## 2019-12-23 DIAGNOSIS — S70312D Abrasion, left thigh, subsequent encounter: Secondary | ICD-10-CM | POA: Diagnosis not present

## 2019-12-23 DIAGNOSIS — I279 Pulmonary heart disease, unspecified: Secondary | ICD-10-CM | POA: Diagnosis not present

## 2019-12-23 DIAGNOSIS — J9611 Chronic respiratory failure with hypoxia: Secondary | ICD-10-CM | POA: Diagnosis not present

## 2019-12-23 DIAGNOSIS — S70311D Abrasion, right thigh, subsequent encounter: Secondary | ICD-10-CM | POA: Diagnosis not present

## 2019-12-23 DIAGNOSIS — E119 Type 2 diabetes mellitus without complications: Secondary | ICD-10-CM | POA: Diagnosis not present

## 2019-12-23 DIAGNOSIS — J449 Chronic obstructive pulmonary disease, unspecified: Secondary | ICD-10-CM | POA: Diagnosis not present

## 2019-12-23 DIAGNOSIS — D509 Iron deficiency anemia, unspecified: Secondary | ICD-10-CM | POA: Diagnosis not present

## 2019-12-23 DIAGNOSIS — L03115 Cellulitis of right lower limb: Secondary | ICD-10-CM | POA: Diagnosis not present

## 2019-12-23 DIAGNOSIS — I5032 Chronic diastolic (congestive) heart failure: Secondary | ICD-10-CM | POA: Diagnosis not present

## 2019-12-23 NOTE — Telephone Encounter (Signed)
We limited the cipro to 5 days since her kidney function is not good -need to be very careful  Let's see what the final culture shows before I make a plan  Thanks for letting me know

## 2019-12-23 NOTE — Telephone Encounter (Signed)
Pt notified of urine cx results. Pt said that she was only given 5 days of abx and she doesn't feel that it cleared up her UTI, pt still is having dysuria and urinary frequency, please advise

## 2019-12-23 NOTE — Telephone Encounter (Signed)
Prelim result on urine culture returned and it shows e coli that is sensitive to the cipro I gave her (good)   Still waiting on the final to see if there is another organism- I suspect that will return soon   Please let her know

## 2019-12-25 DIAGNOSIS — G4733 Obstructive sleep apnea (adult) (pediatric): Secondary | ICD-10-CM | POA: Diagnosis not present

## 2019-12-25 DIAGNOSIS — J9611 Chronic respiratory failure with hypoxia: Secondary | ICD-10-CM | POA: Diagnosis not present

## 2019-12-25 DIAGNOSIS — E119 Type 2 diabetes mellitus without complications: Secondary | ICD-10-CM | POA: Diagnosis not present

## 2019-12-25 DIAGNOSIS — I5032 Chronic diastolic (congestive) heart failure: Secondary | ICD-10-CM | POA: Diagnosis not present

## 2019-12-25 DIAGNOSIS — S70312D Abrasion, left thigh, subsequent encounter: Secondary | ICD-10-CM | POA: Diagnosis not present

## 2019-12-25 DIAGNOSIS — J449 Chronic obstructive pulmonary disease, unspecified: Secondary | ICD-10-CM | POA: Diagnosis not present

## 2019-12-25 DIAGNOSIS — I279 Pulmonary heart disease, unspecified: Secondary | ICD-10-CM | POA: Diagnosis not present

## 2019-12-25 DIAGNOSIS — S70311D Abrasion, right thigh, subsequent encounter: Secondary | ICD-10-CM | POA: Diagnosis not present

## 2019-12-25 DIAGNOSIS — L03115 Cellulitis of right lower limb: Secondary | ICD-10-CM | POA: Diagnosis not present

## 2019-12-25 DIAGNOSIS — D509 Iron deficiency anemia, unspecified: Secondary | ICD-10-CM | POA: Diagnosis not present

## 2019-12-28 DIAGNOSIS — J9611 Chronic respiratory failure with hypoxia: Secondary | ICD-10-CM | POA: Diagnosis not present

## 2019-12-28 DIAGNOSIS — S70311D Abrasion, right thigh, subsequent encounter: Secondary | ICD-10-CM | POA: Diagnosis not present

## 2019-12-28 DIAGNOSIS — J449 Chronic obstructive pulmonary disease, unspecified: Secondary | ICD-10-CM | POA: Diagnosis not present

## 2019-12-28 DIAGNOSIS — L03115 Cellulitis of right lower limb: Secondary | ICD-10-CM | POA: Diagnosis not present

## 2019-12-28 DIAGNOSIS — D509 Iron deficiency anemia, unspecified: Secondary | ICD-10-CM | POA: Diagnosis not present

## 2019-12-28 DIAGNOSIS — I5032 Chronic diastolic (congestive) heart failure: Secondary | ICD-10-CM | POA: Diagnosis not present

## 2019-12-28 DIAGNOSIS — I279 Pulmonary heart disease, unspecified: Secondary | ICD-10-CM | POA: Diagnosis not present

## 2019-12-28 DIAGNOSIS — E119 Type 2 diabetes mellitus without complications: Secondary | ICD-10-CM | POA: Diagnosis not present

## 2019-12-28 DIAGNOSIS — S70312D Abrasion, left thigh, subsequent encounter: Secondary | ICD-10-CM | POA: Diagnosis not present

## 2019-12-30 ENCOUNTER — Ambulatory Visit: Payer: Medicare HMO | Admitting: Family Medicine

## 2019-12-30 ENCOUNTER — Ambulatory Visit (INDEPENDENT_AMBULATORY_CARE_PROVIDER_SITE_OTHER): Payer: Medicare HMO

## 2019-12-30 ENCOUNTER — Telehealth: Payer: Self-pay | Admitting: *Deleted

## 2019-12-30 DIAGNOSIS — S70312D Abrasion, left thigh, subsequent encounter: Secondary | ICD-10-CM | POA: Diagnosis not present

## 2019-12-30 DIAGNOSIS — Z7901 Long term (current) use of anticoagulants: Secondary | ICD-10-CM | POA: Diagnosis not present

## 2019-12-30 DIAGNOSIS — D509 Iron deficiency anemia, unspecified: Secondary | ICD-10-CM | POA: Diagnosis not present

## 2019-12-30 DIAGNOSIS — E119 Type 2 diabetes mellitus without complications: Secondary | ICD-10-CM | POA: Diagnosis not present

## 2019-12-30 DIAGNOSIS — J9611 Chronic respiratory failure with hypoxia: Secondary | ICD-10-CM | POA: Diagnosis not present

## 2019-12-30 DIAGNOSIS — S70311D Abrasion, right thigh, subsequent encounter: Secondary | ICD-10-CM | POA: Diagnosis not present

## 2019-12-30 DIAGNOSIS — J449 Chronic obstructive pulmonary disease, unspecified: Secondary | ICD-10-CM | POA: Diagnosis not present

## 2019-12-30 DIAGNOSIS — L03115 Cellulitis of right lower limb: Secondary | ICD-10-CM | POA: Diagnosis not present

## 2019-12-30 DIAGNOSIS — I5032 Chronic diastolic (congestive) heart failure: Secondary | ICD-10-CM | POA: Diagnosis not present

## 2019-12-30 DIAGNOSIS — I279 Pulmonary heart disease, unspecified: Secondary | ICD-10-CM | POA: Diagnosis not present

## 2019-12-30 LAB — POCT INR: INR: 3.3 — AB (ref 2.0–3.0)

## 2019-12-30 NOTE — Telephone Encounter (Signed)
French Ana with [facility] called Triage. They wanted to report pt had a fall today. She fell trying to get in her wheelchair. She fell directly on the floor and landed on her bottom. French Ana said pt is fine and reports there is no injuries at all and pt isn't in any pain. French Ana said they just have to report fall so it is documented. I did advise French Ana (who was still with pt) to have pt or her call us back if pt develops any pain, swelling, or any other issues due to fall. They both verbalized understanding.  FYI to PCP

## 2019-12-30 NOTE — Telephone Encounter (Signed)
Aware, thanks for letting me know  

## 2019-12-30 NOTE — Patient Instructions (Addendum)
Pre visit review using our clinic review tool, if applicable. No additional management support is needed unless otherwise documented below in the visit note.    Hold dose today then continue normal dosing of 2.5mg  daily, except 5mg  M, W, F. Recheck in 3 wks.

## 2020-01-04 DIAGNOSIS — J9611 Chronic respiratory failure with hypoxia: Secondary | ICD-10-CM | POA: Diagnosis not present

## 2020-01-04 DIAGNOSIS — L03115 Cellulitis of right lower limb: Secondary | ICD-10-CM | POA: Diagnosis not present

## 2020-01-04 DIAGNOSIS — S70312D Abrasion, left thigh, subsequent encounter: Secondary | ICD-10-CM | POA: Diagnosis not present

## 2020-01-04 DIAGNOSIS — I5032 Chronic diastolic (congestive) heart failure: Secondary | ICD-10-CM | POA: Diagnosis not present

## 2020-01-04 DIAGNOSIS — E119 Type 2 diabetes mellitus without complications: Secondary | ICD-10-CM | POA: Diagnosis not present

## 2020-01-04 DIAGNOSIS — J449 Chronic obstructive pulmonary disease, unspecified: Secondary | ICD-10-CM | POA: Diagnosis not present

## 2020-01-04 DIAGNOSIS — I279 Pulmonary heart disease, unspecified: Secondary | ICD-10-CM | POA: Diagnosis not present

## 2020-01-04 DIAGNOSIS — S70311D Abrasion, right thigh, subsequent encounter: Secondary | ICD-10-CM | POA: Diagnosis not present

## 2020-01-04 DIAGNOSIS — D509 Iron deficiency anemia, unspecified: Secondary | ICD-10-CM | POA: Diagnosis not present

## 2020-01-06 ENCOUNTER — Ambulatory Visit (INDEPENDENT_AMBULATORY_CARE_PROVIDER_SITE_OTHER): Payer: Medicare HMO | Admitting: Family Medicine

## 2020-01-06 ENCOUNTER — Encounter: Payer: Self-pay | Admitting: Family Medicine

## 2020-01-06 ENCOUNTER — Other Ambulatory Visit: Payer: Self-pay | Admitting: Family Medicine

## 2020-01-06 VITALS — BP 112/56 | Wt 291.0 lb

## 2020-01-06 DIAGNOSIS — S70312D Abrasion, left thigh, subsequent encounter: Secondary | ICD-10-CM | POA: Diagnosis not present

## 2020-01-06 DIAGNOSIS — Z7409 Other reduced mobility: Secondary | ICD-10-CM

## 2020-01-06 DIAGNOSIS — E1121 Type 2 diabetes mellitus with diabetic nephropathy: Secondary | ICD-10-CM | POA: Diagnosis not present

## 2020-01-06 DIAGNOSIS — L03115 Cellulitis of right lower limb: Secondary | ICD-10-CM | POA: Diagnosis not present

## 2020-01-06 DIAGNOSIS — E662 Morbid (severe) obesity with alveolar hypoventilation: Secondary | ICD-10-CM

## 2020-01-06 DIAGNOSIS — S70311D Abrasion, right thigh, subsequent encounter: Secondary | ICD-10-CM | POA: Diagnosis not present

## 2020-01-06 DIAGNOSIS — E213 Hyperparathyroidism, unspecified: Secondary | ICD-10-CM | POA: Diagnosis not present

## 2020-01-06 DIAGNOSIS — N390 Urinary tract infection, site not specified: Secondary | ICD-10-CM | POA: Diagnosis not present

## 2020-01-06 DIAGNOSIS — J449 Chronic obstructive pulmonary disease, unspecified: Secondary | ICD-10-CM | POA: Diagnosis not present

## 2020-01-06 DIAGNOSIS — I5032 Chronic diastolic (congestive) heart failure: Secondary | ICD-10-CM | POA: Diagnosis not present

## 2020-01-06 DIAGNOSIS — D509 Iron deficiency anemia, unspecified: Secondary | ICD-10-CM

## 2020-01-06 DIAGNOSIS — J9611 Chronic respiratory failure with hypoxia: Secondary | ICD-10-CM | POA: Diagnosis not present

## 2020-01-06 DIAGNOSIS — N1832 Chronic kidney disease, stage 3b: Secondary | ICD-10-CM | POA: Diagnosis not present

## 2020-01-06 DIAGNOSIS — E119 Type 2 diabetes mellitus without complications: Secondary | ICD-10-CM | POA: Diagnosis not present

## 2020-01-06 DIAGNOSIS — I279 Pulmonary heart disease, unspecified: Secondary | ICD-10-CM | POA: Diagnosis not present

## 2020-01-06 MED ORDER — BUDESONIDE-FORMOTEROL FUMARATE 160-4.5 MCG/ACT IN AERO: INHALATION_SPRAY | RESPIRATORY_TRACT | 1 refills | Status: AC

## 2020-01-06 MED ORDER — TRAMADOL HCL 50 MG PO TABS: ORAL_TABLET | ORAL | 0 refills | Status: AC

## 2020-01-06 MED ORDER — GLIPIZIDE ER 5 MG PO TB24: 5.0000 mg | ORAL_TABLET | Freq: Every day | ORAL | 0 refills | Status: AC

## 2020-01-06 NOTE — Progress Notes (Signed)
Virtual Visit via Video Note  I connected with Amy Bryant on 01/06/20 at 10:45 AM EDT by a video enabled telemedicine application and verified that I am speaking with the correct person using two identifiers.  Location: Patient: home  Provider: office    I discussed the limitations of evaluation and management by telemedicine and the availability of in person appointments. The patient expressed understanding and agreed to proceed.  Parties involved in encounter  Patient: Amy Bryant  Provider:  Loura Pardon MD    Video portion of the call failed today and it was conducted by phone   History of Present Illness: Pt presents for f/u of chronic health problems   She fell last Wednesday - took a while to get up (cleaning floor fell onto floor from wheelchair) -no injuries (was sore the next day)  Had to skip PT once but now back on track   Is able to get back and forth to kitchen and bathroom   Had car issues today-was going to come in person   Last wt 291.7  Wt Readings from Last 3 Encounters:  10/07/19 (!) 313 lb 0.9 oz (142 kg)  04/10/19 (!) 400 lb (181.4 kg)  01/14/19 (!) 346 lb 9 oz (157.2 kg)   Continues to loose weight  Breathing is also improved with loosing weight   Does what she can at home-laundry/housework  Still grief from loss of husband  Still cries at times   Family helps her quite a bit  She has not had a covid vaccine   Holding glipizide and then glucose started going up close to 200  Went back on it 84 to 124 usually in ams and pms after eats  No lows   Last serum labs 2/26 Hb improved at 8.6 with nl ferritin at 71  (taking iron)  Cr 1.53-stable Ca 10.4 up   Urine cx 3/11-contaminated  Recurrent uti  Symptoms are better right now   Drinks fluid all day long - water/ice  Depending on next labs may be open to going   Energy level is about the same   Leg are looking better  No longer infection  No more bleeding or weeping   Needs  tramadol refill  Last was 7 day supply on 3/24    Observations/Objective:   Assessment and Plan: Problem List Items Addressed This Visit      Respiratory   Obesity hypoventilation syndrome ([place])    This is improved with gradual weight loss Better stamina Continue 02      Relevant Medications   glipiZIDE (GLUCOTROL XL) 5 MG 24 hr tablet     Endocrine   Diabetes type 2, controlled ([place])    Pt held glipizide temporarily due to hypoglycemia- then started it back after levels went up into 200s Continues to watch diet and loose wt  Glucose is usually 80s-120s- which is re assuring  Last A1C was 5.2  Will re check with next labs Not utd eye exam due to financial issues and unwillingness to leave house  No reported foot problems  Taking statin but no ace       Relevant Medications   glipiZIDE (GLUCOTROL XL) 5 MG 24 hr tablet   Hyperparathyroidism ([place])    Now with CKD Has consistently declined tx  Last ca 10.4   Will re check with next labs  Urged strongly to re consider seeing nephrology  She is not symptomatic  I do worry about bone density  Genitourinary   CKD (chronic kidney disease) stage 3, GFR 30-59 ml/min - Primary    Again strongly enc pt to see nephrology  She has fear of leaving house that adds to this along with financial concerns  Last cr 1.53  Hb 8.6  Enc better fluid intake Avoids nephrotoxic meds Planning labs with home care - pt may re consider based on results      Recurrent UTI    Last urine cx neg and symptoms resolved Drinking water         Other   Morbid obesity ([facility])    Discussed how this problem influences overall health and the risks it imposes  Reviewed plan for weight loss with lower calorie diet (via better food choices and also portion control or program like weight watchers) and exercise building up to or more than 30 minutes 5 days per week including some aerobic activity   Commended wt loss so far        Relevant  Medications   glipiZIDE (GLUCOTROL XL) 5 MG 24 hr tablet   Mobility impaired    More mobile with weight coming down Disc use of walker and fall prevention  Would like to see her getting out with assistance      Absolute anemia    Likely multifactorial with iron def and CKD  Declines nephrology eval so far  Taking iron  Last Hb 8.6 -improved No longer bleeding from wounds  Will plan home draw for re check          Follow Up Instructions: Keep working on weight loss Walk as much as you can safely with your walker and continue PT  Please continue to consider a nephrology consult for your kidney function -this is concerning  I will order labs to be drawn at home   Please make effort to get out now that you are more mobile   I discussed the assessment and treatment plan with the patient. The patient was provided an opportunity to ask questions and all were answered. The patient agreed with the plan and demonstrated an understanding of the instructions.   The patient was advised to call back or seek an in-person evaluation if the symptoms worsen or if the condition fails to improve as anticipated.  I provided 23 minutes of non-face-to-face time during this encounter.   Roxy Manns, MD

## 2020-01-06 NOTE — Assessment & Plan Note (Signed)
Again strongly enc pt to see nephrology  She has fear of leaving house that adds to this along with financial concerns  Last cr 1.53  Hb 8.6  Enc better fluid intake Avoids nephrotoxic meds Planning labs with home care - pt may re consider based on results

## 2020-01-06 NOTE — Patient Instructions (Signed)
Keep working on weight loss Walk as much as you can safely with your walker and continue PT  Please continue to consider a nephrology consult for your kidney function -this is concerning  I will order labs to be drawn at home   Please make effort to get out now that you are more mobile

## 2020-01-06 NOTE — Assessment & Plan Note (Signed)
More mobile with weight coming down Disc use of walker and fall prevention  Would like to see her getting out with assistance

## 2020-01-06 NOTE — Assessment & Plan Note (Signed)
Discussed how this problem influences overall health and the risks it imposes  °Reviewed plan for weight loss with lower calorie diet (via better food choices and also portion control or program like weight watchers) and exercise building up to or more than 30 minutes 5 days per week including some aerobic activity  ° °Commended wt loss so far  °

## 2020-01-06 NOTE — Assessment & Plan Note (Signed)
This is improved with gradual weight loss Better stamina Continue 02

## 2020-01-06 NOTE — Assessment & Plan Note (Signed)
Likely multifactorial with iron def and CKD  Declines nephrology eval so far  Taking iron  Last Hb 8.6 -improved No longer bleeding from wounds  Will plan home draw for re check

## 2020-01-06 NOTE — Assessment & Plan Note (Signed)
Last urine cx neg and symptoms resolved Drinking water

## 2020-01-06 NOTE — Assessment & Plan Note (Signed)
Now with CKD Has consistently declined tx  Last ca 10.4   Will re check with next labs  Urged strongly to re consider seeing nephrology  She is not symptomatic  I do worry about bone density

## 2020-01-06 NOTE — Assessment & Plan Note (Addendum)
Pt held glipizide temporarily due to hypoglycemia- then started it back after levels went up into 200s Continues to watch diet and loose wt  Glucose is usually 80s-120s- which is re assuring  Last A1C was 5.2  Will re check with next labs Not utd eye exam due to financial issues and unwillingness to leave house  No reported foot problems  Taking statin but no ace

## 2020-01-11 DIAGNOSIS — J449 Chronic obstructive pulmonary disease, unspecified: Secondary | ICD-10-CM

## 2020-01-11 DIAGNOSIS — Z7984 Long term (current) use of oral hypoglycemic drugs: Secondary | ICD-10-CM

## 2020-01-11 DIAGNOSIS — N390 Urinary tract infection, site not specified: Secondary | ICD-10-CM

## 2020-01-11 DIAGNOSIS — I279 Pulmonary heart disease, unspecified: Secondary | ICD-10-CM

## 2020-01-11 DIAGNOSIS — M6281 Muscle weakness (generalized): Secondary | ICD-10-CM | POA: Diagnosis not present

## 2020-01-11 DIAGNOSIS — L03115 Cellulitis of right lower limb: Secondary | ICD-10-CM | POA: Diagnosis not present

## 2020-01-11 DIAGNOSIS — J9611 Chronic respiratory failure with hypoxia: Secondary | ICD-10-CM | POA: Diagnosis not present

## 2020-01-11 DIAGNOSIS — J988 Other specified respiratory disorders: Secondary | ICD-10-CM

## 2020-01-11 DIAGNOSIS — R2681 Unsteadiness on feet: Secondary | ICD-10-CM

## 2020-01-11 DIAGNOSIS — Z7901 Long term (current) use of anticoagulants: Secondary | ICD-10-CM

## 2020-01-11 DIAGNOSIS — M150 Primary generalized (osteo)arthritis: Secondary | ICD-10-CM

## 2020-01-11 DIAGNOSIS — E119 Type 2 diabetes mellitus without complications: Secondary | ICD-10-CM

## 2020-01-11 DIAGNOSIS — R2689 Other abnormalities of gait and mobility: Secondary | ICD-10-CM | POA: Diagnosis not present

## 2020-01-11 DIAGNOSIS — I5032 Chronic diastolic (congestive) heart failure: Secondary | ICD-10-CM

## 2020-01-11 DIAGNOSIS — Z9981 Dependence on supplemental oxygen: Secondary | ICD-10-CM

## 2020-01-11 DIAGNOSIS — I4891 Unspecified atrial fibrillation: Secondary | ICD-10-CM

## 2020-01-11 DIAGNOSIS — Z6841 Body Mass Index (BMI) 40.0 and over, adult: Secondary | ICD-10-CM

## 2020-01-11 DIAGNOSIS — Z5181 Encounter for therapeutic drug level monitoring: Secondary | ICD-10-CM

## 2020-01-11 DIAGNOSIS — D509 Iron deficiency anemia, unspecified: Secondary | ICD-10-CM

## 2020-01-11 DIAGNOSIS — S70311D Abrasion, right thigh, subsequent encounter: Secondary | ICD-10-CM | POA: Diagnosis not present

## 2020-01-11 DIAGNOSIS — S70312D Abrasion, left thigh, subsequent encounter: Secondary | ICD-10-CM | POA: Diagnosis not present

## 2020-01-11 DIAGNOSIS — J9621 Acute and chronic respiratory failure with hypoxia: Secondary | ICD-10-CM | POA: Diagnosis not present

## 2020-01-11 DIAGNOSIS — Z87891 Personal history of nicotine dependence: Secondary | ICD-10-CM

## 2020-01-12 DIAGNOSIS — D509 Iron deficiency anemia, unspecified: Secondary | ICD-10-CM | POA: Diagnosis not present

## 2020-01-12 DIAGNOSIS — L03115 Cellulitis of right lower limb: Secondary | ICD-10-CM | POA: Diagnosis not present

## 2020-01-12 DIAGNOSIS — S70312D Abrasion, left thigh, subsequent encounter: Secondary | ICD-10-CM | POA: Diagnosis not present

## 2020-01-12 DIAGNOSIS — N183 Chronic kidney disease, stage 3 unspecified: Secondary | ICD-10-CM | POA: Diagnosis not present

## 2020-01-12 DIAGNOSIS — E7800 Pure hypercholesterolemia, unspecified: Secondary | ICD-10-CM | POA: Diagnosis not present

## 2020-01-12 DIAGNOSIS — S70311D Abrasion, right thigh, subsequent encounter: Secondary | ICD-10-CM | POA: Diagnosis not present

## 2020-01-12 DIAGNOSIS — J9611 Chronic respiratory failure with hypoxia: Secondary | ICD-10-CM | POA: Diagnosis not present

## 2020-01-12 DIAGNOSIS — E559 Vitamin D deficiency, unspecified: Secondary | ICD-10-CM | POA: Diagnosis not present

## 2020-01-12 DIAGNOSIS — I5032 Chronic diastolic (congestive) heart failure: Secondary | ICD-10-CM | POA: Diagnosis not present

## 2020-01-12 DIAGNOSIS — J449 Chronic obstructive pulmonary disease, unspecified: Secondary | ICD-10-CM | POA: Diagnosis not present

## 2020-01-12 DIAGNOSIS — E119 Type 2 diabetes mellitus without complications: Secondary | ICD-10-CM | POA: Diagnosis not present

## 2020-01-12 DIAGNOSIS — E039 Hypothyroidism, unspecified: Secondary | ICD-10-CM | POA: Diagnosis not present

## 2020-01-12 DIAGNOSIS — I279 Pulmonary heart disease, unspecified: Secondary | ICD-10-CM | POA: Diagnosis not present

## 2020-01-13 ENCOUNTER — Other Ambulatory Visit: Payer: Self-pay | Admitting: Family Medicine

## 2020-01-17 DIAGNOSIS — G4733 Obstructive sleep apnea (adult) (pediatric): Secondary | ICD-10-CM | POA: Diagnosis not present

## 2020-01-18 ENCOUNTER — Telehealth: Payer: Self-pay | Admitting: Family Medicine

## 2020-01-18 NOTE — Telephone Encounter (Signed)
I received labs from [facility]   Cholesterol is controlled with HDL of 43 and LDL of 89 Glucose of 101  D5Z of 5.3 (good   Renal fxn Cr is up to 1.76 (was 1.53)  I have asked her if/when she would re consider nephrology eval to let me know   I thought I also ordered cbc and tsh- are those pending or am I wrong?  Thanks

## 2020-01-18 NOTE — Telephone Encounter (Signed)
I found lab order I faxed and it does say to check Cmet, A1c, TSH, CBC with diff, lipid panel, vitamin D level, and ferritin.  Called [facility] to f/u on missing labs (TSH, CBC with diff, Vit D, ferritin). They advised me that the office is closed and they could get me to the on call nurse if urgent if not to f/u tomorrow when office is open so they can review.   I will call office back tomorrow during normal business hrs. [phone]

## 2020-01-19 NOTE — Telephone Encounter (Signed)
Stable cholesterol and blood sugar control  Worse kidney function (Cr of 1.76 up from 1.5 range)-please let me know if she is open to a nephrology consult  Calcium level is stable  Anemia is mildly improved with Hb of 9.4 up from 8.6 (normal is around 12) - but iron stores are low again  Is she still taking iron?    Any new bleeding since we talked last? Vit D level is ok  Thanks

## 2020-01-19 NOTE — Telephone Encounter (Signed)
Pt notified of lab results and Dr. Royden Purl comments. Pt said she isn't ready for the nephrology referral yet she doesn't have transportation right now. Pt said she has been taking her iron as prescribed and hasn't missed any doses. Pt said she doesn't have any new bleeding she has had one nose bleed and it resolved quickly but that was it.

## 2020-01-19 NOTE — Telephone Encounter (Signed)
Received complete lab results and placed lab results in Dr. Royden Purl inbox for review

## 2020-01-19 NOTE — Telephone Encounter (Signed)
Called [facility] and they did do all of the labs we only received page one and there is 2 additional pages of labs we didn't get. They are re-faxing it now

## 2020-01-20 ENCOUNTER — Ambulatory Visit (INDEPENDENT_AMBULATORY_CARE_PROVIDER_SITE_OTHER): Payer: Medicare HMO

## 2020-01-20 DIAGNOSIS — Z7901 Long term (current) use of anticoagulants: Secondary | ICD-10-CM | POA: Diagnosis not present

## 2020-01-20 DIAGNOSIS — L03115 Cellulitis of right lower limb: Secondary | ICD-10-CM | POA: Diagnosis not present

## 2020-01-20 DIAGNOSIS — I480 Paroxysmal atrial fibrillation: Secondary | ICD-10-CM | POA: Diagnosis not present

## 2020-01-20 DIAGNOSIS — I279 Pulmonary heart disease, unspecified: Secondary | ICD-10-CM | POA: Diagnosis not present

## 2020-01-20 DIAGNOSIS — J9611 Chronic respiratory failure with hypoxia: Secondary | ICD-10-CM | POA: Diagnosis not present

## 2020-01-20 DIAGNOSIS — J449 Chronic obstructive pulmonary disease, unspecified: Secondary | ICD-10-CM | POA: Diagnosis not present

## 2020-01-20 DIAGNOSIS — S70312D Abrasion, left thigh, subsequent encounter: Secondary | ICD-10-CM | POA: Diagnosis not present

## 2020-01-20 DIAGNOSIS — I5032 Chronic diastolic (congestive) heart failure: Secondary | ICD-10-CM | POA: Diagnosis not present

## 2020-01-20 DIAGNOSIS — D509 Iron deficiency anemia, unspecified: Secondary | ICD-10-CM | POA: Diagnosis not present

## 2020-01-20 DIAGNOSIS — S70311D Abrasion, right thigh, subsequent encounter: Secondary | ICD-10-CM | POA: Diagnosis not present

## 2020-01-20 DIAGNOSIS — E119 Type 2 diabetes mellitus without complications: Secondary | ICD-10-CM | POA: Diagnosis not present

## 2020-01-20 LAB — POCT INR: INR: 3.1 — AB (ref 2.0–3.0)

## 2020-01-20 NOTE — Patient Instructions (Addendum)
Pre visit review using our clinic review tool, if applicable. No additional management support is needed unless otherwise documented below in the visit note.  Received call from Marisue Ivan [facility], at [phone] reporting INR today is 3.1. She was with the pt. Hold dose today then change weekly dosing to 2.5mg  daily, except 5mg  M and F. Recheck in 2 wks. Both pt and M verbalized understanding.

## 2020-01-20 NOTE — Telephone Encounter (Signed)
Pt notified of Dr. [name]'s comments and appt scheduled  

## 2020-01-20 NOTE — Telephone Encounter (Signed)
I have not seen her in the office in a long time-please schedule in office visit in 3 months  (this will give her time to find transportation)  Glad she is doing better and more mobile with her recent wt loss

## 2020-01-25 DIAGNOSIS — G4733 Obstructive sleep apnea (adult) (pediatric): Secondary | ICD-10-CM | POA: Diagnosis not present

## 2020-01-25 DIAGNOSIS — J449 Chronic obstructive pulmonary disease, unspecified: Secondary | ICD-10-CM | POA: Diagnosis not present

## 2020-01-28 ENCOUNTER — Ambulatory Visit (INDEPENDENT_AMBULATORY_CARE_PROVIDER_SITE_OTHER): Payer: Medicare HMO

## 2020-01-28 VITALS — Wt 291.0 lb

## 2020-01-28 DIAGNOSIS — Z0000 Encounter for general adult medical examination without abnormal findings: Secondary | ICD-10-CM | POA: Diagnosis not present

## 2020-01-28 NOTE — Progress Notes (Signed)
PCP notes:  Health Maintenance: Mammogram- declined Eye exam- declined Tdap- insurance/financial  Abnormal Screenings: none   Patient concerns: none   Nurse concerns: none   Next PCP appt.: 04/26/2020 @ 2 pm

## 2020-01-28 NOTE — Patient Instructions (Signed)
Amy Bryant , Thank you for taking time to come for your Medicare Wellness Visit. I appreciate your ongoing commitment to your health goals. Please review the following plan we discussed and let me know if I can assist you in the future.   Screening recommendations/referrals: Colonoscopy: Up to date, completed 01/12/2019 Mammogram: declined Bone Density: age 64 Recommended yearly ophthalmology/optometry visit for glaucoma screening and checkup Recommended yearly dental visit for hygiene and checkup  Vaccinations: Influenza vaccine: Up to date, completed 10/08/2019 Pneumococcal vaccine: Up to date, completed 01/09/2019 Tdap vaccine: decline Shingles vaccine: discussed    Advanced directives: Advance directive discussed with you today. Even though you declined this today please call our office should you change your mind and we can give you the proper paperwork for you to fill out.  Conditions/risks identified: diabetes, hypercholesterolemia  Next appointment: 04/26/2020 @ 2 pm   Preventive Care 40-64 Years, Female Preventive care refers to lifestyle choices and visits with your health care provider that can promote health and wellness. What does preventive care include?  A yearly physical exam. This is also called an annual well check.  Dental exams once or twice a year.  Routine eye exams. Ask your health care provider how often you should have your eyes checked.  Personal lifestyle choices, including:  Daily care of your teeth and gums.  Regular physical activity.  Eating a healthy diet.  Avoiding tobacco and drug use.  Limiting alcohol use.  Practicing safe sex.  Taking low-dose aspirin daily starting at age 75.  Taking vitamin and mineral supplements as recommended by your health care provider. What happens during an annual well check? The services and screenings done by your health care provider during your annual well check will depend on your age, overall health,  lifestyle risk factors, and family history of disease. Counseling  Your health care provider may ask you questions about your:  Alcohol use.  Tobacco use.  Drug use.  Emotional well-being.  Home and relationship well-being.  Sexual activity.  Eating habits.  Work and work Statistician.  Method of birth control.  Menstrual cycle.  Pregnancy history. Screening  You may have the following tests or measurements:  Height, weight, and BMI.  Blood pressure.  Lipid and cholesterol levels. These may be checked every 5 years, or more frequently if you are over 76 years old.  Skin check.  Lung cancer screening. You may have this screening every year starting at age 52 if you have a 30-pack-year history of smoking and currently smoke or have quit within the past 15 years.  Fecal occult blood test (FOBT) of the stool. You may have this test every year starting at age 57.  Flexible sigmoidoscopy or colonoscopy. You may have a sigmoidoscopy every 5 years or a colonoscopy every 10 years starting at age 4.  Hepatitis C blood test.  Hepatitis B blood test.  Sexually transmitted disease (STD) testing.  Diabetes screening. This is done by checking your blood sugar (glucose) after you have not eaten for a while (fasting). You may have this done every 1-3 years.  Mammogram. This may be done every 1-2 years. Talk to your health care provider about when you should start having regular mammograms. This may depend on whether you have a family history of breast cancer.  BRCA-related cancer screening. This may be done if you have a family history of breast, ovarian, tubal, or peritoneal cancers.  Pelvic exam and Pap test. This may be done every 3 years  starting at age 65. Starting at age 64, this may be done every 5 years if you have a Pap test in combination with an HPV test.  Bone density scan. This is done to screen for osteoporosis. You may have this scan if you are at high risk for  osteoporosis. Discuss your test results, treatment options, and if necessary, the need for more tests with your health care provider. Vaccines  Your health care provider may recommend certain vaccines, such as:  Influenza vaccine. This is recommended every year.  Tetanus, diphtheria, and acellular pertussis (Tdap, Td) vaccine. You may need a Td booster every 10 years.  Zoster vaccine. You may need this after age 47.  Pneumococcal 13-valent conjugate (PCV13) vaccine. You may need this if you have certain conditions and were not previously vaccinated.  Pneumococcal polysaccharide (PPSV23) vaccine. You may need one or two doses if you smoke cigarettes or if you have certain conditions. Talk to your health care provider about which screenings and vaccines you need and how often you need them. This information is not intended to replace advice given to you by your health care provider. Make sure you discuss any questions you have with your health care provider. Document Released: 10/14/2015 Document Revised: 06/06/2016 Document Reviewed: 07/19/2015 Elsevier Interactive Patient Education  2017 [place] Prevention in the Home Falls can cause injuries. They can happen to people of all ages. There are many things you can do to make your home safe and to help prevent falls. What can I do on the outside of my home?  Regularly fix the edges of walkways and driveways and fix any cracks.  Remove anything that might make you trip as you walk through a door, such as a raised step or threshold.  Trim any bushes or trees on the path to your home.  Use bright outdoor lighting.  Clear any walking paths of anything that might make someone trip, such as rocks or tools.  Regularly check to see if handrails are loose or broken. Make sure that both sides of any steps have handrails.  Any raised decks and porches should have guardrails on the edges.  Have any leaves, snow, or ice cleared  regularly.  Use sand or salt on walking paths during winter.  Clean up any spills in your garage right away. This includes oil or grease spills. What can I do in the bathroom?  Use night lights.  Install grab bars by the toilet and in the tub and shower. Do not use towel bars as grab bars.  Use non-skid mats or decals in the tub or shower.  If you need to sit down in the shower, use a plastic, non-slip stool.  Keep the floor dry. Clean up any water that spills on the floor as soon as it happens.  Remove soap buildup in the tub or shower regularly.  Attach bath mats securely with double-sided non-slip rug tape.  Do not have throw rugs and other things on the floor that can make you trip. What can I do in the bedroom?  Use night lights.  Make sure that you have a light by your bed that is easy to reach.  Do not use any sheets or blankets that are too big for your bed. They should not hang down onto the floor.  Have a firm chair that has side arms. You can use this for support while you get dressed.  Do not have throw rugs and  other things on the floor that can make you trip. What can I do in the kitchen?  Clean up any spills right away.  Avoid walking on wet floors.  Keep items that you use a lot in easy-to-reach places.  If you need to reach something above you, use a strong step stool that has a grab bar.  Keep electrical cords out of the way.  Do not use floor polish or wax that makes floors slippery. If you must use wax, use non-skid floor wax.  Do not have throw rugs and other things on the floor that can make you trip. What can I do with my stairs?  Do not leave any items on the stairs.  Make sure that there are handrails on both sides of the stairs and use them. Fix handrails that are broken or loose. Make sure that handrails are as long as the stairways.  Check any carpeting to make sure that it is firmly attached to the stairs. Fix any carpet that is loose  or worn.  Avoid having throw rugs at the top or bottom of the stairs. If you do have throw rugs, attach them to the floor with carpet tape.  Make sure that you have a light switch at the top of the stairs and the bottom of the stairs. If you do not have them, ask someone to add them for you. What else can I do to help prevent falls?  Wear shoes that:  Do not have high heels.  Have rubber bottoms.  Are comfortable and fit you well.  Are closed at the toe. Do not wear sandals.  If you use a stepladder:  Make sure that it is fully opened. Do not climb a closed stepladder.  Make sure that both sides of the stepladder are locked into place.  Ask someone to hold it for you, if possible.  Clearly mark and make sure that you can see:  Any grab bars or handrails.  First and last steps.  Where the edge of each step is.  Use tools that help you move around (mobility aids) if they are needed. These include:  Canes.  Walkers.  Scooters.  Crutches.  Turn on the lights when you go into a dark area. Replace any light bulbs as soon as they burn out.  Set up your furniture so you have a clear path. Avoid moving your furniture around.  If any of your floors are uneven, fix them.  If there are any pets around you, be aware of where they are.  Review your medicines with your doctor. Some medicines can make you feel dizzy. This can increase your chance of falling. Ask your doctor what other things that you can do to help prevent falls. This information is not intended to replace advice given to you by your health care provider. Make sure you discuss any questions you have with your health care provider. Document Released: 07/14/2009 Document Revised: 02/23/2016 Document Reviewed: 10/22/2014 Elsevier Interactive Patient Education  2017 [employer].

## 2020-01-28 NOTE — Progress Notes (Signed)
Subjective:   ASTRIA JORDAHL is a 64 y.o. female who presents for an Initial Medicare Annual Wellness Visit.  Review of Systems: N/A      This visit is being conducted through telemedicine via telephone at the nurse health advisor's home address due to the COVID-19 pandemic. This patient has given me verbal consent via doximity to conduct this visit, patient states they are participating from their home address. Patient and myself are on the telephone call. There is no referral for this visit. Some vital signs may be absent or patient reported.    Patient identification: identified by name, DOB, and current address    Cardiac Risk Factors include: diabetes mellitus;Other (see comment), Risk factor comments: hypercholesterolemia     Objective:    Today's Vitals   01/28/20 1513 01/28/20 1524  Weight: 291 lb (132 kg)   PainSc:  5    Body mass index is 48.42 kg/m.  Advanced Directives 01/28/2020 10/07/2019 10/06/2019 03/28/2019 01/12/2019 01/08/2019 01/08/2019  Does Patient Have a Medical Advance Directive? No - No No No No Yes  Type of Advance Directive - - - - - - Healthcare Power of Attorney  Would patient like information on creating a medical advance directive? No - Patient declined No - Patient declined No - Patient declined Yes (ED - Information included in AVS) - No - Patient declined -    Current Medications (verified) Outpatient Encounter Medications as of 01/28/2020  Medication Sig  . acidophilus (RISAQUAD) CAPS capsule Take 1 capsule by mouth daily.  Marland Kitchen albuterol (PROVENTIL) (2.5 MG/3ML) 0.083% nebulizer solution INHALE THE CONTENTS OF 1 VIAL VIA NEBULIZER EVERY 4 HOURS AS NEEDED FOR WHEEZING  . ALPRAZolam (XANAX) 0.5 MG tablet Take 1 tablet (0.5 mg total) by mouth 2 (two) times daily as needed for anxiety.  . budesonide-formoterol (SYMBICORT) 160-4.5 MCG/ACT inhaler INHALE 2 PUFFS INTO LUNGS TWICE A DAY  . cholecalciferol (VITAMIN D) 1000 UNITS tablet Take 4,000 Units by mouth  daily.  . ciclopirox (LOPROX) 0.77 % cream APPLY TO AFFECTED AREA TWICE A DAY AS NEEDED  . diltiazem (CARTIA XT) 120 MG 24 hr capsule Take 1 capsule (120 mg total) by mouth daily.  Marland Kitchen FERREX 150 150 MG capsule TAKE 1 CAPSULE BY MOUTH TWICE A DAY  . FLUoxetine (PROZAC) 40 MG capsule Take 1 capsule (40 mg total) by mouth daily.  . furosemide (LASIX) 80 MG tablet Take 1 tablet (80 mg total) by mouth 2 (two) times daily.  Marland Kitchen glipiZIDE (GLUCOTROL XL) 5 MG 24 hr tablet Take 1 tablet (5 mg total) by mouth daily with breakfast.  . glucose blood test strip Test blood sugar once daily and as needed for DM 250.0  . Hydrocortisone (GERHARDT'S BUTT CREAM) CREA Apply 1 application topically 3 (three) times daily as needed for irritation.  . Lancets (ONETOUCH ULTRASOFT) lancets Test blood sugar once daily and as needed for DM 250.0   . levothyroxine (SYNTHROID) 175 MCG tablet TAKE 1 TABLET BY MOUTH DAILY BEFORE BREAKFAST. (Patient taking differently: Take 175 mcg by mouth daily before breakfast. )  . mometasone (ELOCON) 0.1 % cream Apply 1 application topically See admin instructions. Tiny amount to each ear canal twice weekly as needed.  . mupirocin ointment (BACTROBAN) 2 % APPLY TO AFFECTED AREA TWICE A DAY (Patient taking differently: Apply 1 application topically as needed (skin). )  . nystatin (MYCOSTATIN) 100000 UNIT/ML suspension Take 5 mLs (500,000 Units total) by mouth 3 (three) times daily. Swish and swallow  .  pantoprazole (PROTONIX) 40 MG tablet Take 1 tablet (40 mg total) by mouth daily at 6 (six) AM.  . potassium chloride SA (KLOR-CON) 20 MEQ tablet TAKE 2 TABLETS BY MOUTH EVERY DAY  . senna-docusate (SENOKOT-S) 8.6-50 MG tablet Take 1 tablet by mouth 2 (two) times daily.  . simvastatin (ZOCOR) 20 MG tablet TAKE 1 TABLET BY MOUTH EVERYDAY AT BEDTIME (Patient taking differently: Take 20 mg by mouth at bedtime. )  . traMADol (ULTRAM) 50 MG tablet 1TAB BY MOUTH EVERY 12HRS AS NEEDED FOR MODERATE/SEVERE  PAIN. CAUTION OF SEDATION/FALLS/CONSTIPATION  . warfarin (COUMADIN) 5 MG tablet Take 0.5-1 tablets (2.5-5 mg total) by mouth See admin instructions. MWF take 5mg . TTSS take 2.5mg    No facility-administered encounter medications on file as of 01/28/2020.    Allergies (verified) Pseudoephedrine, Augmentin [amoxicillin-pot clavulanate], and Latex   History: Past Medical History:  Diagnosis Date  . Atrial fibrillation ([place])    with cardioversion success  . Cellulitis 10/2019   right lower extremity  . CHF (congestive heart failure) ([place])   . COPD (chronic obstructive pulmonary disease) ([place])   . Cor pulmonale ([facility])    and obesity hypoventilation syndrome  . Diabetes mellitus without complication ([place])   . GERD (gastroesophageal reflux disease)   . Hypothyroid   . Morbid obesity ([place])   . OA (osteoarthritis)   . Pulmonary embolism ([facility])    hx of on coumadin  . Reactive airways dysfunction syndrome [facility])    Past Surgical History:  Procedure Laterality Date  . BIOPSY  01/12/2019   Procedure: BIOPSY;  Surgeon: Jerene Bears, MD;  Location: [place];  Service: Gastroenterology;;  . COLONOSCOPY WITH PROPOFOL N/A 01/12/2019   Procedure: COLONOSCOPY WITH PROPOFOL;  Surgeon: Jerene Bears, MD;  Location: [place];  Service: Gastroenterology;  Laterality: N/A;  . ESOPHAGOGASTRODUODENOSCOPY (EGD) WITH PROPOFOL N/A 01/12/2019   Procedure: ESOPHAGOGASTRODUODENOSCOPY (EGD) WITH PROPOFOL;  Surgeon: Jerene Bears, MD;  Location: [facility] ENDOSCOPY;  Service: Gastroenterology;  Laterality: N/A;  . TONSILLECTOMY     Family History  Problem Relation Age of Onset  . Heart disease Father 35       MI  . Diabetes Brother    Social History   Socioeconomic History  . Marital status: Married    Spouse name: Not on file  . Number of children: Not on file  . Years of education: Not on file  . Highest education level: Not on file  Occupational History  . Not on file  Tobacco Use  . Smoking  status: Former Research scientist (life sciences)  . Smokeless tobacco: Former Network engineer and Sexual Activity  . Alcohol use: No    Alcohol/week: 0.0 standard drinks  . Drug use: No  . Sexual activity: Not on file  Other Topics Concern  . Not on file  Social History Narrative  . Not on file   Social Determinants of Health   Financial Resource Strain: Low Risk   . Difficulty of Paying Living Expenses: Not hard at all  Food Insecurity: No Food Insecurity  . Worried About Charity fundraiser in the Last Year: Never true  . Ran Out of Food in the Last Year: Never true  Transportation Needs: No Transportation Needs  . Lack of Transportation (Medical): No  . Lack of Transportation (Non-Medical): No  Physical Activity: Inactive  . Days of Exercise per Week: 0 days  . Minutes of Exercise per Session: 0 min  Stress: Stress Concern Present  . Feeling of Stress :  Very much  Social Connections:   . Frequency of Communication with Friends and Family:   . Frequency of Social Gatherings with Friends and Family:   . Attends Religious Services:   . Active Member of Clubs or Organizations:   . Attends Banker Meetings:   Marland Kitchen Marital Status:     Tobacco Counseling Counseling given: Not Answered   Clinical Intake:  Pre-visit preparation completed: Yes  Pain : 0-10 Pain Score: 5  Pain Type: Chronic pain Pain Location: (all over body) Pain Descriptors / Indicators: Aching Pain Onset: More than a month ago Pain Frequency: Constant     Nutritional Risks: None Diabetes: Yes CBG done?: No Did pt. bring in CBG monitor from home?: No  How often do you need to have someone help you when you read instructions, pamphlets, or other written materials from your doctor or pharmacy?: 1 - Never What is the last grade level you completed in school?: 2 years of college  Interpreter Needed?: No  Information entered by :: CJohnson, LPN   Activities of Daily Living In your present state of health, do  you have any difficulty performing the following activities: 01/28/2020 10/07/2019  Hearing? Y -  Comment some hearing loss in left ear -  Vision? N -  Difficulty concentrating or making decisions? N -  Walking or climbing stairs? N -  Dressing or bathing? N -  Doing errands, shopping? Malvin Johns  Comment son helps -  Quarry manager and eating ? N -  Using the Toilet? N -  In the past six months, have you accidently leaked urine? Y -  Comment wears panty liner -  Do you have problems with loss of bowel control? N -  Managing your Medications? N -  Managing your Finances? N -  Housekeeping or managing your Housekeeping? Y -  Comment son helps -  Some recent data might be hidden     Immunizations and Health Maintenance Immunization History  Administered Date(s) Administered  . Influenza Whole 08/19/2001, 07/16/2008, 08/19/2009, 08/03/2010  . Influenza,inj,Quad PF,6+ Mos 06/12/2013, 06/18/2014, 07/11/2015, 12/03/2017, 10/08/2019  . Pneumococcal Polysaccharide-23 10/01/1998, 08/19/2009, 01/09/2019  . Td 07/06/2005   Health Maintenance Due  Topic Date Due  . Hepatitis C Screening  Never done  . COVID-19 Vaccine (1) Never done  . FOOT EXAM  12/04/2018  . URINE MICROALBUMIN  12/04/2018    Patient Care Team: [name], [name], MD as PCP - General  Indicate any recent Medical Services you may have received from other than [facility] providers in the past year (date may be approximate).     Assessment:   This is a routine wellness examination for Kathye.  Hearing/Vision screen  Hearing Screening   125Hz  250Hz  500Hz  1000Hz  2000Hz  3000Hz  4000Hz  6000Hz  8000Hz   Right ear:           Left ear:           Vision Screening Comments: Advised patient to get annual eye exams  Dietary issues and exercise activities discussed: Current Exercise Habits: The patient does not participate in regular exercise at present, Exercise limited by: orthopedic condition(s)  Goals    . Patient Stated      01/28/2020, I will maintain and continue medications as prescribed.       Depression Screen PHQ 2/9 Scores 01/28/2020 12/03/2017  PHQ - 2 Score 6 5  PHQ- 9 Score 12 15    Fall Risk Fall Risk  01/28/2020  Falls in the past year?  1  Number falls in past yr: 1  Injury with Fall? 0  Risk for fall due to : Medication side effect;Impaired balance/gait;Impaired mobility  Follow up Falls evaluation completed;Falls prevention discussed    Is the patient's home free of loose throw rugs in walkways, pet beds, electrical cords, etc?   yes      Grab bars in the bathroom? yes      Handrails on the stairs?   yes      Adequate lighting?   yes  Timed Get Up and Go Performed: N/A  Cognitive Function: MMSE - Mini Mental State Exam 01/28/2020  Orientation to time 5  Orientation to Place 5  Registration 3  Attention/ Calculation 5  Recall 3  Language- repeat 1       Mini Cog  Mini-Cog screen was completed. Maximum score is 22. A value of 0 denotes this part of the MMSE was not completed or the patient failed this part of the Mini-Cog screening.  Screening Tests Health Maintenance  Topic Date Due  . Hepatitis C Screening  Never done  . COVID-19 Vaccine (1) Never done  . FOOT EXAM  12/04/2018  . URINE MICROALBUMIN  12/04/2018  . MAMMOGRAM  11/02/2020 (Originally 07/01/2005)  . PAP SMEAR-Modifier  11/02/2020 (Originally 07/01/2006)  . TETANUS/TDAP  12/04/2020 (Originally 07/07/2015)  . OPHTHALMOLOGY EXAM  11/03/2023 (Originally 11/27/2012)  . HEMOGLOBIN A1C  04/05/2020  . INFLUENZA VACCINE  05/01/2020  . COLONOSCOPY  01/11/2029  . HIV Screening  Completed    Qualifies for Shingles Vaccine: Yes  Cancer Screenings: Lung: Low Dose CT Chest recommended if Age 69-80 years, 30 pack-year currently smoking OR have quit w/in 15 years. Patient does not qualify. Breast: Up to date on Mammogram: No, declined   Up to date of Bone Density/Dexa: N/A, at age 89 Colorectal: completed  01/12/2019  Additional Screenings:  Hepatitis C Screening: due     Plan:   Patient will maintain and continue medications as prescribed.   I have personally reviewed and noted the following in the patient's chart:   . Medical and social history . Use of alcohol, tobacco or illicit drugs  . Current medications and supplements . Functional ability and status . Nutritional status . Physical activity . Advanced directives . List of other physicians . Hospitalizations, surgeries, and ER visits in previous 12 months . Vitals . Screenings to include cognitive, depression, and falls . Referrals and appointments  In addition, I have reviewed and discussed with patient certain preventive protocols, quality metrics, and best practice recommendations. A written personalized care plan for preventive services as well as general preventive health recommendations were provided to patient.     Janalyn Shy, LPN   [id number]

## 2020-02-05 ENCOUNTER — Telehealth: Payer: Self-pay

## 2020-02-05 ENCOUNTER — Ambulatory Visit (INDEPENDENT_AMBULATORY_CARE_PROVIDER_SITE_OTHER): Payer: Medicare HMO

## 2020-02-05 ENCOUNTER — Telehealth: Payer: Self-pay | Admitting: Family Medicine

## 2020-02-05 DIAGNOSIS — S70312D Abrasion, left thigh, subsequent encounter: Secondary | ICD-10-CM | POA: Diagnosis not present

## 2020-02-05 DIAGNOSIS — I279 Pulmonary heart disease, unspecified: Secondary | ICD-10-CM | POA: Diagnosis not present

## 2020-02-05 DIAGNOSIS — Z7901 Long term (current) use of anticoagulants: Secondary | ICD-10-CM

## 2020-02-05 DIAGNOSIS — I5032 Chronic diastolic (congestive) heart failure: Secondary | ICD-10-CM | POA: Diagnosis not present

## 2020-02-05 DIAGNOSIS — E119 Type 2 diabetes mellitus without complications: Secondary | ICD-10-CM | POA: Diagnosis not present

## 2020-02-05 DIAGNOSIS — J9611 Chronic respiratory failure with hypoxia: Secondary | ICD-10-CM | POA: Diagnosis not present

## 2020-02-05 DIAGNOSIS — D509 Iron deficiency anemia, unspecified: Secondary | ICD-10-CM | POA: Diagnosis not present

## 2020-02-05 DIAGNOSIS — J449 Chronic obstructive pulmonary disease, unspecified: Secondary | ICD-10-CM | POA: Diagnosis not present

## 2020-02-05 DIAGNOSIS — S70311D Abrasion, right thigh, subsequent encounter: Secondary | ICD-10-CM | POA: Diagnosis not present

## 2020-02-05 DIAGNOSIS — L03115 Cellulitis of right lower limb: Secondary | ICD-10-CM | POA: Diagnosis not present

## 2020-02-05 LAB — POCT INR: INR: 1.8 — AB (ref 2.0–3.0)

## 2020-02-05 NOTE — Patient Instructions (Signed)
Pre visit review using our clinic review tool, if applicable. No additional management support is needed unless otherwise documented below in the visit note.  Received call from Marisue Ivan [facility], at [phone] reporting INR today is 1.8. She was with the pt. Increase dose today to 7.5mg  then continue 2.5mg  daily, except 5mg  M and F. Recheck in 2 wks. Both pt and M verbalized understanding. They reported pt has been having chest/sinus cong for 5 days. Advised a msg would be sent to Dr. Marisue Ivan and this office would f/u. Both pt and [name] verbalized understandings.

## 2020-02-05 NOTE — Telephone Encounter (Signed)
Please set her up for Saturday clinic if they are doing virtual (I prefer [facility] but I know she cannot leave right now)

## 2020-02-05 NOTE — Telephone Encounter (Signed)
Pt is due for coumadin apt.  LVM. Pt did say she is not sure if [facility] is coming back out after the last visit on 4/21

## 2020-02-05 NOTE — Telephone Encounter (Signed)
This has been addressed in an anticoag encounter 

## 2020-02-05 NOTE — Telephone Encounter (Signed)
Pt only has a flip phone and the laptop she usually uses is her son's who is in [place] right now so phone call appt scheduled with on-call doc for tomorrow

## 2020-02-05 NOTE — Telephone Encounter (Signed)
Pt and HH nurse Marisue Ivan, called to report INR and also that pt is c/o chest and sinus congestion, productive cough with yellow phlegm and SOB. Marisue Ivan reports auscultation reveals wheezing and LUL congestion. Pt denies fever, sore throat, changes in taste/smell, N/V/D. Pt reports she gets sinus infections every year and this is a sinus infection. Pt is not able to come to the office for an OV due to no transportation. Advised a msg would be sent to PCP and this office will f/u. Advised if any symptoms worsened to call EMS. Pt and Marisue Ivan verbalized understanding.

## 2020-02-06 ENCOUNTER — Telehealth (INDEPENDENT_AMBULATORY_CARE_PROVIDER_SITE_OTHER): Payer: Medicare HMO | Admitting: Family Medicine

## 2020-02-06 ENCOUNTER — Encounter: Payer: Self-pay | Admitting: Family Medicine

## 2020-02-06 VITALS — Temp 98.7°F | Ht 65.5 in

## 2020-02-06 DIAGNOSIS — J9611 Chronic respiratory failure with hypoxia: Secondary | ICD-10-CM | POA: Diagnosis not present

## 2020-02-06 DIAGNOSIS — E662 Morbid (severe) obesity with alveolar hypoventilation: Secondary | ICD-10-CM

## 2020-02-06 DIAGNOSIS — J069 Acute upper respiratory infection, unspecified: Secondary | ICD-10-CM

## 2020-02-06 DIAGNOSIS — J441 Chronic obstructive pulmonary disease with (acute) exacerbation: Secondary | ICD-10-CM

## 2020-02-06 MED ORDER — AZITHROMYCIN 250 MG PO TABS: ORAL_TABLET | ORAL | 0 refills | Status: DC

## 2020-02-06 MED ORDER — PREDNISONE 20 MG PO TABS
ORAL_TABLET | ORAL | 0 refills | Status: DC
Start: 2020-02-06 — End: 2020-02-17

## 2020-02-06 NOTE — Progress Notes (Signed)
Virtual Visit via Video Note  I connected with pt on 02/06/20 at 10:00 AM EDT  By telephone only b/c pt has no smart phone or computer--unable to do video visit.  I verified that I am speaking with the correct person using two identifiers.  Location patient: home Location provider:work or home office Persons participating in the virtual visit: patient, provider  I discussed the limitations of evaluation and management by telemedicine and the availability of in person appointments. The patient expressed understanding and agreed to proceed.  Telemedicine visit is a necessity given the COVID-19 restrictions in place at the current time.  HPI: 64 y/o female with morbid obesity, COPD, OHS, a-fib, chronic anticoagulation, and DM being seen today for "URI symptoms". Coughing, wheezing, tight chest---started with nasal congestion 5 d/a.  Worsening last 2-3d. No fever.  Feels mildly SOB over her baseline.  Uses oxygen 24/7 4L, oxygen sat 97% yesterday and is 98% (HR 62). No nausea, no CP.  No dizziness, no palpitations. Using albut more frequently, gets improvement.  Using 4 treatments per day now, usually 3 per day. No steroids for her copd in recent past.  No known hx of covid infection. Has not had vaccine.   She has been quarantining approp.   ROS: See pertinent positives and negatives per HPI.  Past Medical History:  Diagnosis Date  . Atrial fibrillation (HCC)    with cardioversion success  . Cellulitis 10/2019   right lower extremity  . CHF (congestive heart failure) (HCC)   . COPD (chronic obstructive pulmonary disease) (HCC)   . Cor pulmonale (HCC)    and obesity hypoventilation syndrome  . Diabetes mellitus without complication (HCC)   . GERD (gastroesophageal reflux disease)   . Hypothyroid   . Morbid obesity (HCC)   . OA (osteoarthritis)   . Pulmonary embolism (HCC)    hx of on coumadin  . Reactive airways dysfunction syndrome [facility])     Past Surgical History:   Procedure Laterality Date  . BIOPSY  01/12/2019   Procedure: BIOPSY;  Surgeon: Beverley Fiedler, MD;  Location: [facility] ENDOSCOPY;  Service: Gastroenterology;;  . COLONOSCOPY WITH PROPOFOL N/A 01/12/2019   Procedure: COLONOSCOPY WITH PROPOFOL;  Surgeon: Beverley Fiedler, MD;  Location: [facility] ENDOSCOPY;  Service: Gastroenterology;  Laterality: N/A;  . ESOPHAGOGASTRODUODENOSCOPY (EGD) WITH PROPOFOL N/A 01/12/2019   Procedure: ESOPHAGOGASTRODUODENOSCOPY (EGD) WITH PROPOFOL;  Surgeon: Beverley Fiedler, MD;  Location: [facility] ENDOSCOPY;  Service: Gastroenterology;  Laterality: N/A;  . TONSILLECTOMY      Family History  Problem Relation Age of Onset  . Heart disease Father 71       MI  . Diabetes Brother       Current Outpatient Medications:  .  acidophilus (RISAQUAD) CAPS capsule, Take 1 capsule by mouth daily., Disp: 30 capsule, Rfl: 0 .  albuterol (PROVENTIL) (2.5 MG/3ML) 0.083% nebulizer solution, INHALE THE CONTENTS OF 1 VIAL VIA NEBULIZER EVERY 4 HOURS AS NEEDED FOR WHEEZING, Disp: 630 mL, Rfl: 0 .  ALPRAZolam (XANAX) 0.5 MG tablet, Take 1 tablet (0.5 mg total) by mouth 2 (two) times daily as needed for anxiety., Disp: 60 tablet, Rfl: 1 .  budesonide-formoterol (SYMBICORT) 160-4.5 MCG/ACT inhaler, INHALE 2 PUFFS INTO LUNGS TWICE A DAY, Disp: 3 Inhaler, Rfl: 1 .  cholecalciferol (VITAMIN D) 1000 UNITS tablet, Take 4,000 Units by mouth daily., Disp: , Rfl:  .  ciclopirox (LOPROX) 0.77 % cream, APPLY TO AFFECTED AREA TWICE A DAY AS NEEDED, Disp: 60 g, Rfl: 0 .  diltiazem (CARTIA XT) 120 MG 24 hr capsule, Take 1 capsule (120 mg total) by mouth daily., Disp: 90 capsule, Rfl: 3 .  FERREX 150 150 MG capsule, TAKE 1 CAPSULE BY MOUTH TWICE A DAY, Disp: 180 capsule, Rfl: 1 .  FLUoxetine (PROZAC) 40 MG capsule, Take 1 capsule (40 mg total) by mouth daily., Disp: 90 capsule, Rfl: 1 .  furosemide (LASIX) 80 MG tablet, Take 1 tablet (80 mg total) by mouth 2 (two) times daily., Disp: 180 tablet, Rfl: 2 .  glipiZIDE  (GLUCOTROL XL) 5 MG 24 hr tablet, Take 1 tablet (5 mg total) by mouth daily with breakfast., Disp: 1 tablet, Rfl: 0 .  glucose blood test strip, Test blood sugar once daily and as needed for DM 250.0, Disp: 100 each, Rfl: 3 .  Hydrocortisone (GERHARDT'S BUTT CREAM) CREA, Apply 1 application topically 3 (three) times daily as needed for irritation., Disp: 1 each, Rfl: 0 .  Lancets (ONETOUCH ULTRASOFT) lancets, Test blood sugar once daily and as needed for DM 250.0 , Disp: , Rfl:  .  levothyroxine (SYNTHROID) 175 MCG tablet, TAKE 1 TABLET BY MOUTH DAILY BEFORE BREAKFAST. (Patient taking differently: Take 175 mcg by mouth daily before breakfast. ), Disp: 90 tablet, Rfl: 2 .  mometasone (ELOCON) 0.1 % cream, Apply 1 application topically See admin instructions. Tiny amount to each ear canal twice weekly as needed., Disp: , Rfl:  .  mupirocin ointment (BACTROBAN) 2 %, APPLY TO AFFECTED AREA TWICE A DAY (Patient taking differently: Apply 1 application topically as needed (skin). ), Disp: 22 g, Rfl: 4 .  nystatin (MYCOSTATIN) 100000 UNIT/ML suspension, Take 5 mLs (500,000 Units total) by mouth 3 (three) times daily. Swish and swallow, Disp: 120 mL, Rfl: 0 .  pantoprazole (PROTONIX) 40 MG tablet, Take 1 tablet (40 mg total) by mouth daily at 6 (six) AM., Disp: 90 tablet, Rfl: 2 .  potassium chloride SA (KLOR-CON) 20 MEQ tablet, TAKE 2 TABLETS BY MOUTH EVERY DAY, Disp: 180 tablet, Rfl: 1 .  senna-docusate (SENOKOT-S) 8.6-50 MG tablet, Take 1 tablet by mouth 2 (two) times daily., Disp:  , Rfl:  .  simvastatin (ZOCOR) 20 MG tablet, TAKE 1 TABLET BY MOUTH EVERYDAY AT BEDTIME (Patient taking differently: Take 20 mg by mouth at bedtime. ), Disp: 90 tablet, Rfl: 2 .  traMADol (ULTRAM) 50 MG tablet, 1TAB BY MOUTH EVERY 12HRS AS NEEDED FOR MODERATE/SEVERE PAIN. CAUTION OF SEDATION/FALLS/CONSTIPATION, Disp: 180 tablet, Rfl: 0 .  warfarin (COUMADIN) 5 MG tablet, Take 0.5-1 tablets (2.5-5 mg total) by mouth See admin  instructions. MWF take 5mg . TTSS take 2.5mg , Disp: 90 tablet, Rfl: 1  EXAM:  VITALS per patient if applicable: Temp 89.3 F (37.1 C) (Temporal)   Ht 5' 5.5" (1.664 m)   BMI 47.69 kg/m    GENERAL: alert, oriented, sounds mildly sob but can still speak in sentences ok.  I can hear some mild wheezing.  No further exam b/c audio visit only.  LABS: none today    Chemistry      Component Value Date/Time   NA 135 10/09/2019 0145   K 4.2 10/09/2019 0145   CL 104 10/09/2019 0145   CO2 24 10/09/2019 0145   BUN 39 (H) 10/09/2019 0145   CREATININE 1.60 (H) 10/09/2019 0145      Component Value Date/Time   CALCIUM 8.8 (L) 10/09/2019 0145   CALCIUM 11.7 (H) 11/02/2010 2223   ALKPHOS 89 10/07/2019 0344   AST 38 10/07/2019 0344   ALT  18 10/07/2019 0344   BILITOT 0.7 10/07/2019 0344     Lab Results  Component Value Date   WBC 8.0 10/09/2019   HGB 7.7 (L) 10/09/2019   HCT 24.9 (L) 10/09/2019   MCV 98.4 10/09/2019   PLT 194 10/09/2019   Lab Results  Component Value Date   IRON 38 04/09/2019   TIBC 207 (L) 04/09/2019   FERRITIN 82 04/09/2019    Lab Results  Component Value Date   HGBA1C 5.2 10/07/2019   ASSESSMENT AND PLAN:  Discussed the following assessment and plan:  URI and acute exac copd in pt with chronic hypoxic RF. Covid 19 infection possible. Explained to pt she is at high risk of serious infection/complications if she does get covid. However, I don't think she needs to seek emergency eval at this time (oxygen stable, decent imp with albut nebs, no high fever, only mild sob over her baseline, no n/v or MS changes). However, signs/symptoms to seek emergency care for were reviewed and pt expressed understanding. Prednisone 40mg  qd x 5d. Zpack. Continue albut nebs.  -we discussed possible serious and likely etiologies, options for evaluation and workup, limitations of telemedicine visit vs in person visit, treatment, treatment risks and precautions. Pt prefers to  treat via telemedicine empirically rather then risking or undertaking an in person visit at this moment. Patient agrees to seek prompt in person care if worsening, new symptoms arise, or if is not improving with treatment.   I discussed the assessment and treatment plan with the patient. The patient was provided an opportunity to ask questions and all were answered. The patient agreed with the plan and demonstrated an understanding of the instructions.   The patient was advised to call back or seek an in-person evaluation if the symptoms worsen or if the condition fails to improve as anticipated.  Spent 15 min with pt today, with >50% of this time spent in counseling and care coordination regarding the above problems.  F/u: prn  Signed:  , MD           02/06/2020

## 2020-02-08 NOTE — Telephone Encounter (Signed)
She should not be on that

## 2020-02-08 NOTE — Telephone Encounter (Signed)
??   If pt should be on metformin, I saw a note saying it was d/c due to kidney function, should pt have restarted med and we should refill it or stay d/c, CPE scheduled for 04/26/20

## 2020-02-08 NOTE — Telephone Encounter (Signed)
Patient called about refill request She stated [employer] reached out to them that they have not received anything  Patient also stated that she is not taking the Metformin anymore so they should not have send a request for that.

## 2020-02-08 NOTE — Telephone Encounter (Signed)
A user error has taken place: encounter opened in error, closed for administrative reasons.

## 2020-02-09 ENCOUNTER — Telehealth: Payer: Self-pay

## 2020-02-09 DIAGNOSIS — E119 Type 2 diabetes mellitus without complications: Secondary | ICD-10-CM | POA: Diagnosis not present

## 2020-02-09 DIAGNOSIS — I279 Pulmonary heart disease, unspecified: Secondary | ICD-10-CM | POA: Diagnosis not present

## 2020-02-09 DIAGNOSIS — D509 Iron deficiency anemia, unspecified: Secondary | ICD-10-CM | POA: Diagnosis not present

## 2020-02-09 DIAGNOSIS — J9611 Chronic respiratory failure with hypoxia: Secondary | ICD-10-CM | POA: Diagnosis not present

## 2020-02-09 DIAGNOSIS — I5032 Chronic diastolic (congestive) heart failure: Secondary | ICD-10-CM | POA: Diagnosis not present

## 2020-02-09 DIAGNOSIS — J449 Chronic obstructive pulmonary disease, unspecified: Secondary | ICD-10-CM | POA: Diagnosis not present

## 2020-02-09 DIAGNOSIS — L03115 Cellulitis of right lower limb: Secondary | ICD-10-CM | POA: Diagnosis not present

## 2020-02-09 DIAGNOSIS — S70311D Abrasion, right thigh, subsequent encounter: Secondary | ICD-10-CM | POA: Diagnosis not present

## 2020-02-09 DIAGNOSIS — S70312D Abrasion, left thigh, subsequent encounter: Secondary | ICD-10-CM | POA: Diagnosis not present

## 2020-02-09 MED ORDER — SIMVASTATIN 20 MG PO TABS: ORAL_TABLET | ORAL | 0 refills | Status: DC

## 2020-02-09 MED ORDER — ALBUTEROL SULFATE (2.5 MG/3ML) 0.083% IN NEBU: 2.5000 mg | INHALATION_SOLUTION | RESPIRATORY_TRACT | 0 refills | Status: DC | PRN

## 2020-02-09 MED ORDER — PANTOPRAZOLE SODIUM 40 MG PO TBEC: 40.0000 mg | DELAYED_RELEASE_TABLET | Freq: Every day | ORAL | 0 refills | Status: DC

## 2020-02-09 MED ORDER — FUROSEMIDE 80 MG PO TABS: 80.0000 mg | ORAL_TABLET | Freq: Two times a day (BID) | ORAL | 0 refills | Status: DC

## 2020-02-09 NOTE — Telephone Encounter (Addendum)
[facility] and they only shipped pt a one month supply of her meds, not sure why instead of the 3 months so she does need refills. meds refilled once to get her by until her CPE in July. Called pt and advised her of this

## 2020-02-09 NOTE — Telephone Encounter (Signed)
She needs to be seen and examined.  I know she has reluctance to leave her house and no transportation.  If she needs to call EMS that is fine.   If a family member can drive her I would recommend urgent care (one that has xray like [facility])

## 2020-02-09 NOTE — Addendum Note (Signed)
Addended by: Shon Millet on: 02/09/2020 10:53 AM   Modules accepted: Orders

## 2020-02-09 NOTE — Telephone Encounter (Signed)
Pt notified of Dr. Royden Purl comments. Pt said she doesn't have anyone to take her to an UC. I advise her given her sxs she needs to call 911 and go to the ER. Pt verbalized understanding and said she would   FYI to PCP

## 2020-02-09 NOTE — Telephone Encounter (Signed)
Good, I will watch for records/correspondence from ER

## 2020-02-09 NOTE — Telephone Encounter (Signed)
Amy Bryant with [facility] called with the pt to report pt had VV on Saturday for sinus symptoms. Pt was prescribed z-pack and will finish tomorrow. Pt c/o wheezing, hemoptysis starting today(pink tinge with yellow phlegm), 100.2 temp, 94%O2sat on 4L oxygen. Pt denies any other symptoms but does report a z-pack does not normally work for her.INR today is 1.9, pt range is 2-23, no changes to dosing since she was bumped up on Friday.  Advised a msg would be sent to PCP and this office would f/u. Advised if any symptoms worsened or she developed any new symptoms to call EMS. Pt and Amy Bryant verbalized understanding.   Amy Bryant also said that there is a company, [employer], that will come to the house to get an x-ray if needed. Phone number is [phone].

## 2020-02-10 ENCOUNTER — Other Ambulatory Visit: Payer: Self-pay | Admitting: Family Medicine

## 2020-02-10 DIAGNOSIS — R2689 Other abnormalities of gait and mobility: Secondary | ICD-10-CM | POA: Diagnosis not present

## 2020-02-10 DIAGNOSIS — J9621 Acute and chronic respiratory failure with hypoxia: Secondary | ICD-10-CM | POA: Diagnosis not present

## 2020-02-10 DIAGNOSIS — M6281 Muscle weakness (generalized): Secondary | ICD-10-CM | POA: Diagnosis not present

## 2020-02-10 MED ORDER — DILTIAZEM HCL ER COATED BEADS 120 MG PO CP24: 120.0000 mg | ORAL_CAPSULE | Freq: Every day | ORAL | 0 refills | Status: AC

## 2020-02-10 NOTE — Telephone Encounter (Signed)
Noted she did not go to ER or UC Woke up today feeling "fine" per phone conversation with [place] and did not desire eval or tx  Said her symptoms were better

## 2020-02-10 NOTE — Telephone Encounter (Signed)
Please confirm with her which one she is taking/is covered  I'm fine with either (they are the same)   Please ask also how she is feeling since I don't see ER records

## 2020-02-10 NOTE — Telephone Encounter (Signed)
Pt does want the generic not the name brand, Rx for diltiazem sent.  Pt said she doesn't want to go to the ER because she "feels better". She said she "slept it off" and woke up "fine", when I asked about all of her sxs from yesterday she said they are "all better" and she's fine.   FYI to PCP

## 2020-02-10 NOTE — Telephone Encounter (Signed)
Electronic refill request. Is this okay to fill, it was d/c in chart and generic diltiazem was filled last on chart. CPE scheduled for 04/26/20  FYI pt didn't go to ER last night (no ER records in chart)

## 2020-02-16 ENCOUNTER — Other Ambulatory Visit: Payer: Self-pay | Admitting: *Deleted

## 2020-02-16 ENCOUNTER — Emergency Department (HOSPITAL_COMMUNITY): Payer: Medicare HMO

## 2020-02-16 ENCOUNTER — Ambulatory Visit (INDEPENDENT_AMBULATORY_CARE_PROVIDER_SITE_OTHER): Payer: Medicare HMO

## 2020-02-16 ENCOUNTER — Telehealth: Payer: Self-pay

## 2020-02-16 ENCOUNTER — Other Ambulatory Visit: Payer: Self-pay

## 2020-02-16 ENCOUNTER — Inpatient Hospital Stay (HOSPITAL_COMMUNITY)
Admission: EM | Admit: 2020-02-16 | Discharge: 2020-03-31 | DRG: 177 | Disposition: E | Payer: Medicare HMO | Attending: Internal Medicine | Admitting: Internal Medicine

## 2020-02-16 ENCOUNTER — Encounter: Payer: Self-pay | Admitting: Family Medicine

## 2020-02-16 DIAGNOSIS — Z6841 Body Mass Index (BMI) 40.0 and over, adult: Secondary | ICD-10-CM | POA: Diagnosis not present

## 2020-02-16 DIAGNOSIS — Z8249 Family history of ischemic heart disease and other diseases of the circulatory system: Secondary | ICD-10-CM

## 2020-02-16 DIAGNOSIS — R0689 Other abnormalities of breathing: Secondary | ICD-10-CM | POA: Diagnosis not present

## 2020-02-16 DIAGNOSIS — Z9981 Dependence on supplemental oxygen: Secondary | ICD-10-CM

## 2020-02-16 DIAGNOSIS — D649 Anemia, unspecified: Secondary | ICD-10-CM | POA: Diagnosis present

## 2020-02-16 DIAGNOSIS — S70312D Abrasion, left thigh, subsequent encounter: Secondary | ICD-10-CM | POA: Diagnosis not present

## 2020-02-16 DIAGNOSIS — M1990 Unspecified osteoarthritis, unspecified site: Secondary | ICD-10-CM | POA: Diagnosis present

## 2020-02-16 DIAGNOSIS — I509 Heart failure, unspecified: Secondary | ICD-10-CM | POA: Diagnosis not present

## 2020-02-16 DIAGNOSIS — D6859 Other primary thrombophilia: Secondary | ICD-10-CM | POA: Diagnosis present

## 2020-02-16 DIAGNOSIS — N179 Acute kidney failure, unspecified: Secondary | ICD-10-CM | POA: Diagnosis present

## 2020-02-16 DIAGNOSIS — E039 Hypothyroidism, unspecified: Secondary | ICD-10-CM | POA: Diagnosis present

## 2020-02-16 DIAGNOSIS — D631 Anemia in chronic kidney disease: Secondary | ICD-10-CM | POA: Diagnosis present

## 2020-02-16 DIAGNOSIS — E662 Morbid (severe) obesity with alveolar hypoventilation: Secondary | ICD-10-CM | POA: Diagnosis not present

## 2020-02-16 DIAGNOSIS — E119 Type 2 diabetes mellitus without complications: Secondary | ICD-10-CM

## 2020-02-16 DIAGNOSIS — I700 Atherosclerosis of aorta: Secondary | ICD-10-CM | POA: Diagnosis present

## 2020-02-16 DIAGNOSIS — Z7901 Long term (current) use of anticoagulants: Secondary | ICD-10-CM | POA: Diagnosis not present

## 2020-02-16 DIAGNOSIS — R7402 Elevation of levels of lactic acid dehydrogenase (LDH): Secondary | ICD-10-CM | POA: Diagnosis present

## 2020-02-16 DIAGNOSIS — J441 Chronic obstructive pulmonary disease with (acute) exacerbation: Secondary | ICD-10-CM | POA: Diagnosis not present

## 2020-02-16 DIAGNOSIS — I5033 Acute on chronic diastolic (congestive) heart failure: Secondary | ICD-10-CM | POA: Diagnosis not present

## 2020-02-16 DIAGNOSIS — R9431 Abnormal electrocardiogram [ECG] [EKG]: Secondary | ICD-10-CM | POA: Diagnosis present

## 2020-02-16 DIAGNOSIS — Z7189 Other specified counseling: Secondary | ICD-10-CM

## 2020-02-16 DIAGNOSIS — D72819 Decreased white blood cell count, unspecified: Secondary | ICD-10-CM | POA: Diagnosis present

## 2020-02-16 DIAGNOSIS — R7989 Other specified abnormal findings of blood chemistry: Secondary | ICD-10-CM | POA: Diagnosis present

## 2020-02-16 DIAGNOSIS — J9690 Respiratory failure, unspecified, unspecified whether with hypoxia or hypercapnia: Secondary | ICD-10-CM | POA: Diagnosis not present

## 2020-02-16 DIAGNOSIS — E8809 Other disorders of plasma-protein metabolism, not elsewhere classified: Secondary | ICD-10-CM | POA: Diagnosis present

## 2020-02-16 DIAGNOSIS — R509 Fever, unspecified: Secondary | ICD-10-CM | POA: Diagnosis not present

## 2020-02-16 DIAGNOSIS — Z888 Allergy status to other drugs, medicaments and biological substances status: Secondary | ICD-10-CM

## 2020-02-16 DIAGNOSIS — G4733 Obstructive sleep apnea (adult) (pediatric): Secondary | ICD-10-CM | POA: Diagnosis not present

## 2020-02-16 DIAGNOSIS — Z515 Encounter for palliative care: Secondary | ICD-10-CM

## 2020-02-16 DIAGNOSIS — I480 Paroxysmal atrial fibrillation: Secondary | ICD-10-CM | POA: Diagnosis present

## 2020-02-16 DIAGNOSIS — Z79899 Other long term (current) drug therapy: Secondary | ICD-10-CM

## 2020-02-16 DIAGNOSIS — Z880 Allergy status to penicillin: Secondary | ICD-10-CM

## 2020-02-16 DIAGNOSIS — J440 Chronic obstructive pulmonary disease with acute lower respiratory infection: Secondary | ICD-10-CM | POA: Diagnosis present

## 2020-02-16 DIAGNOSIS — R0602 Shortness of breath: Secondary | ICD-10-CM

## 2020-02-16 DIAGNOSIS — D6869 Other thrombophilia: Secondary | ICD-10-CM | POA: Diagnosis present

## 2020-02-16 DIAGNOSIS — F329 Major depressive disorder, single episode, unspecified: Secondary | ICD-10-CM | POA: Diagnosis present

## 2020-02-16 DIAGNOSIS — J80 Acute respiratory distress syndrome: Secondary | ICD-10-CM | POA: Diagnosis present

## 2020-02-16 DIAGNOSIS — Z20822 Contact with and (suspected) exposure to covid-19: Secondary | ICD-10-CM

## 2020-02-16 DIAGNOSIS — F32A Depression, unspecified: Secondary | ICD-10-CM | POA: Diagnosis present

## 2020-02-16 DIAGNOSIS — N183 Chronic kidney disease, stage 3 unspecified: Secondary | ICD-10-CM | POA: Diagnosis present

## 2020-02-16 DIAGNOSIS — E1122 Type 2 diabetes mellitus with diabetic chronic kidney disease: Secondary | ICD-10-CM | POA: Diagnosis present

## 2020-02-16 DIAGNOSIS — J9611 Chronic respiratory failure with hypoxia: Secondary | ICD-10-CM | POA: Diagnosis not present

## 2020-02-16 DIAGNOSIS — J9601 Acute respiratory failure with hypoxia: Secondary | ICD-10-CM

## 2020-02-16 DIAGNOSIS — Z66 Do not resuscitate: Secondary | ICD-10-CM | POA: Diagnosis not present

## 2020-02-16 DIAGNOSIS — R0902 Hypoxemia: Secondary | ICD-10-CM | POA: Diagnosis not present

## 2020-02-16 DIAGNOSIS — U071 COVID-19: Secondary | ICD-10-CM

## 2020-02-16 DIAGNOSIS — J9600 Acute respiratory failure, unspecified whether with hypoxia or hypercapnia: Secondary | ICD-10-CM | POA: Diagnosis not present

## 2020-02-16 DIAGNOSIS — Z79890 Hormone replacement therapy (postmenopausal): Secondary | ICD-10-CM

## 2020-02-16 DIAGNOSIS — S70311D Abrasion, right thigh, subsequent encounter: Secondary | ICD-10-CM | POA: Diagnosis not present

## 2020-02-16 DIAGNOSIS — Z86711 Personal history of pulmonary embolism: Secondary | ICD-10-CM | POA: Diagnosis not present

## 2020-02-16 DIAGNOSIS — N1832 Chronic kidney disease, stage 3b: Secondary | ICD-10-CM | POA: Diagnosis present

## 2020-02-16 DIAGNOSIS — I10 Essential (primary) hypertension: Secondary | ICD-10-CM | POA: Diagnosis not present

## 2020-02-16 DIAGNOSIS — I4892 Unspecified atrial flutter: Secondary | ICD-10-CM | POA: Diagnosis present

## 2020-02-16 DIAGNOSIS — J189 Pneumonia, unspecified organism: Secondary | ICD-10-CM | POA: Diagnosis not present

## 2020-02-16 DIAGNOSIS — Z91040 Latex allergy status: Secondary | ICD-10-CM

## 2020-02-16 DIAGNOSIS — J1282 Pneumonia due to coronavirus disease 2019: Secondary | ICD-10-CM | POA: Diagnosis not present

## 2020-02-16 DIAGNOSIS — R000 Tachycardia, unspecified: Secondary | ICD-10-CM | POA: Diagnosis not present

## 2020-02-16 DIAGNOSIS — R062 Wheezing: Secondary | ICD-10-CM | POA: Diagnosis not present

## 2020-02-16 DIAGNOSIS — I361 Nonrheumatic tricuspid (valve) insufficiency: Secondary | ICD-10-CM | POA: Diagnosis not present

## 2020-02-16 DIAGNOSIS — J9621 Acute and chronic respiratory failure with hypoxia: Secondary | ICD-10-CM | POA: Diagnosis not present

## 2020-02-16 DIAGNOSIS — Z7951 Long term (current) use of inhaled steroids: Secondary | ICD-10-CM

## 2020-02-16 DIAGNOSIS — M7989 Other specified soft tissue disorders: Secondary | ICD-10-CM | POA: Diagnosis not present

## 2020-02-16 DIAGNOSIS — J90 Pleural effusion, not elsewhere classified: Secondary | ICD-10-CM | POA: Diagnosis present

## 2020-02-16 DIAGNOSIS — R7982 Elevated C-reactive protein (CRP): Secondary | ICD-10-CM | POA: Diagnosis present

## 2020-02-16 DIAGNOSIS — I4891 Unspecified atrial fibrillation: Secondary | ICD-10-CM | POA: Diagnosis present

## 2020-02-16 DIAGNOSIS — Z87891 Personal history of nicotine dependence: Secondary | ICD-10-CM

## 2020-02-16 DIAGNOSIS — R0601 Orthopnea: Secondary | ICD-10-CM | POA: Diagnosis present

## 2020-02-16 DIAGNOSIS — K219 Gastro-esophageal reflux disease without esophagitis: Secondary | ICD-10-CM | POA: Diagnosis present

## 2020-02-16 DIAGNOSIS — I878 Other specified disorders of veins: Secondary | ICD-10-CM | POA: Diagnosis present

## 2020-02-16 DIAGNOSIS — J449 Chronic obstructive pulmonary disease, unspecified: Secondary | ICD-10-CM | POA: Diagnosis not present

## 2020-02-16 DIAGNOSIS — I279 Pulmonary heart disease, unspecified: Secondary | ICD-10-CM | POA: Diagnosis present

## 2020-02-16 DIAGNOSIS — Z833 Family history of diabetes mellitus: Secondary | ICD-10-CM

## 2020-02-16 DIAGNOSIS — Z7984 Long term (current) use of oral hypoglycemic drugs: Secondary | ICD-10-CM

## 2020-02-16 DIAGNOSIS — D509 Iron deficiency anemia, unspecified: Secondary | ICD-10-CM | POA: Diagnosis not present

## 2020-02-16 DIAGNOSIS — I5032 Chronic diastolic (congestive) heart failure: Secondary | ICD-10-CM | POA: Diagnosis not present

## 2020-02-16 DIAGNOSIS — K5900 Constipation, unspecified: Secondary | ICD-10-CM | POA: Clinically undetermined

## 2020-02-16 DIAGNOSIS — Z7952 Long term (current) use of systemic steroids: Secondary | ICD-10-CM

## 2020-02-16 DIAGNOSIS — F419 Anxiety disorder, unspecified: Secondary | ICD-10-CM | POA: Diagnosis present

## 2020-02-16 DIAGNOSIS — J9620 Acute and chronic respiratory failure, unspecified whether with hypoxia or hypercapnia: Secondary | ICD-10-CM | POA: Diagnosis present

## 2020-02-16 DIAGNOSIS — L03115 Cellulitis of right lower limb: Secondary | ICD-10-CM | POA: Diagnosis not present

## 2020-02-16 LAB — CBC WITH DIFFERENTIAL/PLATELET
Abs Immature Granulocytes: 0.11 10*3/uL — ABNORMAL HIGH (ref 0.00–0.07)
Basophils Absolute: 0 10*3/uL (ref 0.0–0.1)
Basophils Relative: 0 %
Eosinophils Absolute: 0 10*3/uL (ref 0.0–0.5)
Eosinophils Relative: 0 %
HCT: 36.5 % (ref 36.0–46.0)
Hemoglobin: 10.6 g/dL — ABNORMAL LOW (ref 12.0–15.0)
Immature Granulocytes: 2 %
Lymphocytes Relative: 7 %
Lymphs Abs: 0.5 10*3/uL — ABNORMAL LOW (ref 0.7–4.0)
MCH: 28.8 pg (ref 26.0–34.0)
MCHC: 29 g/dL — ABNORMAL LOW (ref 30.0–36.0)
MCV: 99.2 fL (ref 80.0–100.0)
Monocytes Absolute: 0.3 10*3/uL (ref 0.1–1.0)
Monocytes Relative: 4 %
Neutro Abs: 6.2 10*3/uL (ref 1.7–7.7)
Neutrophils Relative %: 87 %
Platelets: 307 10*3/uL (ref 150–400)
RBC: 3.68 MIL/uL — ABNORMAL LOW (ref 3.87–5.11)
RDW: 13.7 % (ref 11.5–15.5)
WBC: 7.1 10*3/uL (ref 4.0–10.5)
nRBC: 0.3 % — ABNORMAL HIGH (ref 0.0–0.2)

## 2020-02-16 LAB — COMPREHENSIVE METABOLIC PANEL
ALT: 13 U/L (ref 0–44)
AST: 19 U/L (ref 15–41)
Albumin: 3.1 g/dL — ABNORMAL LOW (ref 3.5–5.0)
Alkaline Phosphatase: 68 U/L (ref 38–126)
Anion gap: 14 (ref 5–15)
BUN: 37 mg/dL — ABNORMAL HIGH (ref 8–23)
CO2: 22 mmol/L (ref 22–32)
Calcium: 9.8 mg/dL (ref 8.9–10.3)
Chloride: 105 mmol/L (ref 98–111)
Creatinine, Ser: 1.79 mg/dL — ABNORMAL HIGH (ref 0.44–1.00)
GFR calc Af Amer: 34 mL/min — ABNORMAL LOW (ref >60)
GFR calc non Af Amer: 29 mL/min — ABNORMAL LOW (ref >60)
Glucose, Bld: 125 mg/dL — ABNORMAL HIGH (ref 70–99)
Potassium: 4.1 mmol/L (ref 3.5–5.1)
Sodium: 141 mmol/L (ref 135–145)
Total Bilirubin: 0.6 mg/dL (ref 0.3–1.2)
Total Protein: 7.3 g/dL (ref 6.5–8.1)

## 2020-02-16 LAB — PROCALCITONIN: Procalcitonin: <0.1 ng/mL

## 2020-02-16 LAB — POCT INR: INR: 4.4 — AB (ref 2.0–3.0)

## 2020-02-16 LAB — TRIGLYCERIDES: Triglycerides: 143 mg/dL (ref <150)

## 2020-02-16 LAB — FIBRINOGEN: Fibrinogen: 795 mg/dL — ABNORMAL HIGH (ref 210–475)

## 2020-02-16 LAB — POC SARS CORONAVIRUS 2 AG -  ED: SARS Coronavirus 2 Ag: POSITIVE — AB

## 2020-02-16 LAB — LACTIC ACID, PLASMA: Lactic Acid, Venous: 1.6 mmol/L (ref 0.5–1.9)

## 2020-02-16 LAB — FERRITIN: Ferritin: 74 ng/mL (ref 11–307)

## 2020-02-16 LAB — LACTATE DEHYDROGENASE: LDH: 259 U/L — ABNORMAL HIGH (ref 98–192)

## 2020-02-16 LAB — BRAIN NATRIURETIC PEPTIDE: B Natriuretic Peptide: 304.5 pg/mL — ABNORMAL HIGH (ref 0.0–100.0)

## 2020-02-16 LAB — C-REACTIVE PROTEIN: CRP: 6.5 mg/dL — ABNORMAL HIGH (ref <1.0)

## 2020-02-16 LAB — D-DIMER, QUANTITATIVE: D-Dimer, Quant: 2.37 ug/mL-FEU — ABNORMAL HIGH (ref 0.00–0.50)

## 2020-02-16 LAB — TROPONIN I (HIGH SENSITIVITY): Troponin I (High Sensitivity): 9 ng/L (ref <18)

## 2020-02-16 MED ORDER — SIMVASTATIN 20 MG PO TABS: ORAL_TABLET | ORAL | 0 refills | Status: AC

## 2020-02-16 MED ORDER — GUAIFENESIN-DM 100-10 MG/5ML PO SYRP
10.0000 mL | ORAL_SOLUTION | ORAL | Status: DC | PRN
  Administered 2020-02-21 – 2020-03-01 (×10): 10 mL via ORAL
  Filled 2020-02-16 (×13): qty 10

## 2020-02-16 MED ORDER — ALBUTEROL SULFATE (2.5 MG/3ML) 0.083% IN NEBU: 2.5000 mg | INHALATION_SOLUTION | RESPIRATORY_TRACT | 0 refills | Status: AC | PRN

## 2020-02-16 MED ORDER — SODIUM CHLORIDE 0.9 % IV SOLN: 250.0000 mL | INTRAVENOUS | Status: DC | PRN

## 2020-02-16 MED ORDER — ASCORBIC ACID 500 MG PO TABS
500.0000 mg | ORAL_TABLET | Freq: Every day | ORAL | Status: DC
  Administered 2020-02-17 – 2020-03-01 (×14): 500 mg via ORAL
  Filled 2020-02-16 (×15): qty 1

## 2020-02-16 MED ORDER — BUDESONIDE 180 MCG/ACT IN AEPB
2.0000 | INHALATION_SPRAY | Freq: Two times a day (BID) | RESPIRATORY_TRACT | Status: DC
  Administered 2020-02-17 – 2020-02-18 (×3): 2 via RESPIRATORY_TRACT
  Filled 2020-02-16 (×2): qty 1

## 2020-02-16 MED ORDER — PANTOPRAZOLE SODIUM 40 MG PO TBEC: 40.0000 mg | DELAYED_RELEASE_TABLET | Freq: Every day | ORAL | 0 refills | Status: AC

## 2020-02-16 MED ORDER — INSULIN ASPART 100 UNIT/ML ~~LOC~~ SOLN
0.0000 [IU] | Freq: Three times a day (TID) | SUBCUTANEOUS | Status: DC
  Administered 2020-02-17 (×2): 1 [IU] via SUBCUTANEOUS
  Administered 2020-02-17: 2 [IU] via SUBCUTANEOUS
  Administered 2020-02-18: 3 [IU] via SUBCUTANEOUS
  Administered 2020-02-18: 1 [IU] via SUBCUTANEOUS
  Administered 2020-02-18 – 2020-02-19 (×4): 2 [IU] via SUBCUTANEOUS
  Administered 2020-02-20: 3 [IU] via SUBCUTANEOUS
  Administered 2020-02-20 (×2): 2 [IU] via SUBCUTANEOUS
  Administered 2020-02-21: 1 [IU] via SUBCUTANEOUS
  Administered 2020-02-21: 3 [IU] via SUBCUTANEOUS
  Administered 2020-02-21: 1 [IU] via SUBCUTANEOUS
  Administered 2020-02-22: 2 [IU] via SUBCUTANEOUS
  Administered 2020-02-23: 3 [IU] via SUBCUTANEOUS
  Administered 2020-02-24: 5 [IU] via SUBCUTANEOUS
  Administered 2020-02-24: 1 [IU] via SUBCUTANEOUS
  Administered 2020-02-25: 3 [IU] via SUBCUTANEOUS
  Administered 2020-02-26 – 2020-02-27 (×2): 1 [IU] via SUBCUTANEOUS
  Administered 2020-02-27: 3 [IU] via SUBCUTANEOUS
  Administered 2020-02-28: 1 [IU] via SUBCUTANEOUS
  Administered 2020-02-28: 2 [IU] via SUBCUTANEOUS
  Administered 2020-02-29: 1 [IU] via SUBCUTANEOUS
  Administered 2020-02-29: 2 [IU] via SUBCUTANEOUS

## 2020-02-16 MED ORDER — ZINC SULFATE 220 (50 ZN) MG PO CAPS
220.0000 mg | ORAL_CAPSULE | Freq: Every day | ORAL | Status: DC
  Administered 2020-02-17 – 2020-03-01 (×14): 220 mg via ORAL
  Filled 2020-02-16 (×15): qty 1

## 2020-02-16 MED ORDER — ACETAMINOPHEN 325 MG PO TABS
650.0000 mg | ORAL_TABLET | Freq: Four times a day (QID) | ORAL | Status: DC | PRN
  Administered 2020-02-18 – 2020-02-21 (×3): 650 mg via ORAL
  Filled 2020-02-16 (×3): qty 2

## 2020-02-16 MED ORDER — SODIUM CHLORIDE 0.9% FLUSH
3.0000 mL | Freq: Two times a day (BID) | INTRAVENOUS | Status: DC
  Administered 2020-02-17 – 2020-02-20 (×8): 3 mL via INTRAVENOUS

## 2020-02-16 MED ORDER — IPRATROPIUM-ALBUTEROL 20-100 MCG/ACT IN AERS
2.0000 | INHALATION_SPRAY | Freq: Four times a day (QID) | RESPIRATORY_TRACT | Status: DC
  Administered 2020-02-17 – 2020-02-19 (×9): 2 via RESPIRATORY_TRACT
  Filled 2020-02-16 (×2): qty 4

## 2020-02-16 MED ORDER — INSULIN ASPART 100 UNIT/ML ~~LOC~~ SOLN
0.0000 [IU] | Freq: Every day | SUBCUTANEOUS | Status: DC
  Administered 2020-02-18: 2 [IU] via SUBCUTANEOUS
  Administered 2020-02-19 – 2020-02-22 (×3): 3 [IU] via SUBCUTANEOUS
  Administered 2020-02-23: 4 [IU] via SUBCUTANEOUS
  Administered 2020-02-24: 2 [IU] via SUBCUTANEOUS
  Administered 2020-02-25: 3 [IU] via SUBCUTANEOUS
  Administered 2020-02-29 – 2020-03-01 (×2): 0 [IU] via SUBCUTANEOUS

## 2020-02-16 MED ORDER — FUROSEMIDE 80 MG PO TABS: 80.0000 mg | ORAL_TABLET | Freq: Two times a day (BID) | ORAL | 0 refills | Status: AC

## 2020-02-16 MED ORDER — METHYLPREDNISOLONE SODIUM SUCC 125 MG IJ SOLR
60.0000 mg | Freq: Four times a day (QID) | INTRAMUSCULAR | Status: DC
  Administered 2020-02-17: 60 mg via INTRAVENOUS
  Filled 2020-02-16: qty 2

## 2020-02-16 MED ORDER — TOCILIZUMAB 400 MG/20ML IV SOLN
800.0000 mg | Freq: Once | INTRAVENOUS | Status: AC
  Administered 2020-02-17: 800 mg via INTRAVENOUS
  Filled 2020-02-16 (×2): qty 40

## 2020-02-16 MED ORDER — FUROSEMIDE 10 MG/ML IJ SOLN: 40.0000 mg | Freq: Two times a day (BID) | INTRAMUSCULAR | Status: DC

## 2020-02-16 MED ORDER — SODIUM CHLORIDE 0.9 % IV SOLN
200.0000 mg | Freq: Once | INTRAVENOUS | Status: AC
  Administered 2020-02-16: 200 mg via INTRAVENOUS
  Filled 2020-02-16: qty 40

## 2020-02-16 MED ORDER — SODIUM CHLORIDE 0.9% FLUSH: 3.0000 mL | INTRAVENOUS | Status: DC | PRN

## 2020-02-16 MED ORDER — VITAMIN D 25 MCG (1000 UNIT) PO TABS
1000.0000 [IU] | ORAL_TABLET | Freq: Every day | ORAL | Status: DC
  Administered 2020-02-17 – 2020-03-01 (×14): 1000 [IU] via ORAL
  Filled 2020-02-16 (×15): qty 1

## 2020-02-16 MED ORDER — FUROSEMIDE 10 MG/ML IJ SOLN
40.0000 mg | Freq: Once | INTRAMUSCULAR | Status: AC
  Administered 2020-02-16: 40 mg via INTRAVENOUS
  Filled 2020-02-16: qty 4

## 2020-02-16 MED ORDER — HYDROCOD POLST-CPM POLST ER 10-8 MG/5ML PO SUER
5.0000 mL | Freq: Two times a day (BID) | ORAL | Status: DC | PRN
  Administered 2020-02-17 – 2020-03-02 (×19): 5 mL via ORAL
  Filled 2020-02-16 (×20): qty 5

## 2020-02-16 MED ORDER — DEXAMETHASONE SODIUM PHOSPHATE 10 MG/ML IJ SOLN: 6.0000 mg | Freq: Once | INTRAMUSCULAR | Status: DC

## 2020-02-16 MED ORDER — SODIUM CHLORIDE 0.9 % IV SOLN
100.0000 mg | Freq: Every day | INTRAVENOUS | Status: AC
  Administered 2020-02-17 – 2020-02-20 (×4): 100 mg via INTRAVENOUS
  Filled 2020-02-16 (×5): qty 20

## 2020-02-16 MED ORDER — ALBUTEROL SULFATE HFA 108 (90 BASE) MCG/ACT IN AERS: 2.0000 | INHALATION_SPRAY | RESPIRATORY_TRACT | Status: DC | PRN

## 2020-02-16 NOTE — Telephone Encounter (Signed)
Thanks for letting me know -will watch epic for notes

## 2020-02-16 NOTE — Telephone Encounter (Signed)
Received call from Marisue Ivan [facility], at [phone] reporting INR today is 4.4. She also reported pt's O2 sat for the last 3 days has been 72% and she advised pt she needed to go to the ER. Pt did not want to go but she talked the pt into going and EMS picked her up. She also advised EMS her INR was 4.4.  Will f/u to see if pt has been admitted or d/c in a couple of days.

## 2020-02-16 NOTE — ED Provider Notes (Signed)
[facility] EMERGENCY DEPARTMENT Provider Note   CSN: [id number] Arrival date & time: Feb 26, 2020  1716     History Chief complaint: Shortness of breath  Amy Bryant is a 64 y.o. female.  HPI   Patient presents to the ED for evaluation of shortness of breath.  Patient states she has a history of COPD and CHF.  Several days ago she started having worsening shortness of breath.  She has had low-grade fevers as well as coughing.  She denies any chest pain.  She has not noticed any increased leg swelling.  She has continued on her home medications but the symptoms have gotten worse.  Today she was feeling extremely short of breath.  EMS was called.  On arrival of her oxygen was in the mid 70s.  Patient is normally on supplemental nasal cannula oxygen.  They started her on a nonrebreather.  Patient has not had Covid this year.  She has not been vaccinated.  Past Medical History:  Diagnosis Date  . Atrial fibrillation ([place])    with cardioversion success  . Cellulitis 10/2019   right lower extremity  . CHF (congestive heart failure) ([place])   . COPD (chronic obstructive pulmonary disease) ([place])   . Cor pulmonale (HCC)    and obesity hypoventilation syndrome  . Diabetes mellitus without complication ([place])   . GERD (gastroesophageal reflux disease)   . Hypothyroid   . Morbid obesity ([place])   . OA (osteoarthritis)   . Pulmonary embolism (HCC)    hx of on coumadin  . Reactive airways dysfunction syndrome [facility])     Patient Active Problem List   Diagnosis Date Noted  . Recurrent UTI 01/06/2020  . Discoloration and thickening of nails both feet 11/12/2019  . CKD (chronic kidney disease) stage 3, GFR 30-59 ml/min 07/31/2019  . Long term (current) use of anticoagulants 06/04/2019  . Primary hypercoagulable state ([place]) 06/04/2019  . Acute on chronic diastolic CHF (congestive heart failure) ([place]) 04/11/2019  . Panic attacks 01/20/2019  . Iron deficiency anemia: Severe  01/14/2019  . Symptomatic anemia   . Coagulopathy ([place])   . Chronic anticoagulation   . Absolute anemia 01/08/2019  . Reactive depression (situational) 12/29/2018  . Mobility impaired 12/12/2018  . Hematuria 12/03/2017  . Pedal edema 12/23/2016  . OSA (obstructive sleep apnea) 12/21/2016  . Financial difficulties 09/16/2016  . Colon cancer screening 12/15/2013  . Routine general medical examination at a health care facility 05/20/2012  . Vitamin D deficiency 11/15/2010  . DYSURIA, HX OF 08/19/2009  . PURE HYPERCHOLESTEROLEMIA 08/16/2009  . Diabetes type 2, controlled ([place]) 08/13/2008  . Hyperparathyroidism ([place]) 07/30/2008  . Hypothyroidism 02/13/2008  . Morbid obesity ([place]) 02/04/2008  . Reactive airway disease 02/04/2008  . GERD 02/04/2008  . [place] DISEASE, LUMBAR 02/04/2008  . TOBACCO USE, QUIT 02/04/2008  . Obesity hypoventilation syndrome ([place]) 08/11/2007  . Chronic pulmonary heart disease ([place]) 08/11/2007    Past Surgical History:  Procedure Laterality Date  . BIOPSY  01/12/2019   Procedure: BIOPSY;  Surgeon: Jerene Bears, MD;  Location: [place];  Service: Gastroenterology;;  . COLONOSCOPY WITH PROPOFOL N/A 01/12/2019   Procedure: COLONOSCOPY WITH PROPOFOL;  Surgeon: Jerene Bears, MD;  Location: [place];  Service: Gastroenterology;  Laterality: N/A;  . ESOPHAGOGASTRODUODENOSCOPY (EGD) WITH PROPOFOL N/A 01/12/2019   Procedure: ESOPHAGOGASTRODUODENOSCOPY (EGD) WITH PROPOFOL;  Surgeon: Jerene Bears, MD;  Location: [facility] ENDOSCOPY;  Service: Gastroenterology;  Laterality: N/A;  . TONSILLECTOMY  OB History   No obstetric history on file.     Family History  Problem Relation Age of Onset  . Heart disease Father 22       MI  . Diabetes Brother     Social History   Tobacco Use  . Smoking status: Former Games developer  . Smokeless tobacco: Former Engineer, water Use Topics  . Alcohol use: No    Alcohol/week: 0.0 standard drinks  . Drug use: No    Home  Medications Prior to Admission medications   Medication Sig Start Date End Date Taking? Authorizing Provider  acidophilus (RISAQUAD) CAPS capsule Take 1 capsule by mouth daily. 10/10/19   Pokhrel, Rebekah [place], MD  albuterol (PROVENTIL) (2.5 MG/3ML) 0.083% nebulizer solution Take 3 mLs (2.5 mg total) by nebulization every 4 (four) hours as needed for wheezing or shortness of breath. March 07, 2020   [name], [name], MD  ALPRAZolam Prudy Feeler) 0.5 MG tablet Take 1 tablet (0.5 mg total) by mouth 2 (two) times daily as needed for anxiety. 10/19/19   [name], [name], MD  azithromycin (ZITHROMAX) 250 MG tablet 2 tabs po qd x 1d, then 1 tab po qd x 4d 02/06/20   McGowen, Maryjean Morn, MD  budesonide-formoterol (SYMBICORT) 160-4.5 MCG/ACT inhaler INHALE 2 PUFFS INTO LUNGS TWICE A DAY 01/06/20   [name], [name], MD  cholecalciferol (VITAMIN D) 1000 UNITS tablet Take 4,000 Units by mouth daily.    [provider]  ciclopirox (LOPROX) 0.77 % cream APPLY TO AFFECTED AREA TWICE A DAY AS NEEDED 11/17/19   [name], [name], MD  diltiazem (CARTIA XT) 120 MG 24 hr capsule Take 1 capsule (120 mg total) by mouth daily. 02/10/20   [name], [name], MD  FERREX 150 150 MG capsule TAKE 1 CAPSULE BY MOUTH TWICE A DAY 11/18/19   [name], [name], MD  FLUoxetine (PROZAC) 40 MG capsule Take 1 capsule (40 mg total) by mouth daily. 07/31/19   [name], [name], MD  furosemide (LASIX) 80 MG tablet Take 1 tablet (80 mg total) by mouth 2 (two) times daily. 03/07/20   [name], [name], MD  glipiZIDE (GLUCOTROL XL) 5 MG 24 hr tablet Take 1 tablet (5 mg total) by mouth daily with breakfast. 01/06/20   [name], [name], MD  glucose blood test strip Test blood sugar once daily and as needed for DM 250.0 11/19/14   [name], [name], MD  Hydrocortisone (GERHARDT'S BUTT CREAM) CREA Apply 1 application topically 3 (three) times daily as needed for irritation. 04/11/19   Rodolph Bong, MD  Lancets [facility] ULTRASOFT) lancets Test blood sugar once daily and as needed for DM  250.0     [provider]  levothyroxine (SYNTHROID) 175 MCG tablet TAKE 1 TABLET BY MOUTH DAILY BEFORE BREAKFAST. Patient taking differently: Take 175 mcg by mouth daily before breakfast.  07/24/19   [name], [name], MD  mometasone (ELOCON) 0.1 % cream Apply 1 application topically See admin instructions. Tiny amount to each ear canal twice weekly as needed.    [provider]  mupirocin ointment (BACTROBAN) 2 % APPLY TO AFFECTED AREA TWICE A DAY Patient taking differently: Apply 1 application topically as needed (skin).  01/01/19   [name], [name], MD  nystatin (MYCOSTATIN) 100000 UNIT/ML suspension Take 5 mLs (500,000 Units total) by mouth 3 (three) times daily. Swish and swallow 10/19/19   [name], [name], MD  pantoprazole (PROTONIX) 40 MG tablet Take 1 tablet (40 mg total) by mouth daily at  6 (six) AM. Mar 16, 2020   [name], [name], MD  potassium chloride SA (KLOR-CON) 20 MEQ tablet TAKE 2 TABLETS BY MOUTH EVERY DAY 01/14/20   [name], [name], MD  predniSONE (DELTASONE) 20 MG tablet 2 tabs po qd x 5d 02/06/20   McGowen, Maryjean Morn, MD  senna-docusate (SENOKOT-S) 8.6-50 MG tablet Take 1 tablet by mouth 2 (two) times daily. 04/11/19   Rodolph Bong, MD  simvastatin (ZOCOR) 20 MG tablet TAKE 1 TABLET BY MOUTH EVERYDAY AT BEDTIME Mar 16, 2020   [name], [name], MD  traMADol (ULTRAM) 50 MG tablet 1TAB BY MOUTH EVERY 12HRS AS NEEDED FOR MODERATE/SEVERE PAIN. CAUTION OF SEDATION/FALLS/CONSTIPATION 01/06/20   [name], [name], MD  warfarin (COUMADIN) 5 MG tablet Take 0.5-1 tablets (2.5-5 mg total) by mouth See admin instructions. MWF take 5mg . TTSS take 2.5mg  11/26/19   [name], 11/28/19, MD    Allergies    Pseudoephedrine, Augmentin [amoxicillin-pot clavulanate], and Latex  Review of Systems   Review of Systems  All other systems reviewed and are negative.   Physical Exam Updated Vital Signs BP (!) 146/98   Pulse 71   Temp 97.7 F (36.5 C) (Tympanic)   Resp (!) 28   Ht 1.664 m (5' 5.5")   Wt 132  kg   SpO2 93%   BMI 47.69 kg/m   Physical Exam Vitals and nursing note reviewed.  Constitutional:      General: She is in acute distress.     Appearance: She is well-developed. She is obese. She is ill-appearing.  HENT:     Head: Normocephalic and atraumatic.     Right Ear: External ear normal.     Left Ear: External ear normal.  Eyes:     General: No scleral icterus.       Right eye: No discharge.        Left eye: No discharge.     Conjunctiva/sclera: Conjunctivae normal.  Neck:     Trachea: No tracheal deviation.  Cardiovascular:     Rate and Rhythm: Regular rhythm. Tachycardia present.  Pulmonary:     Effort: Pulmonary effort is normal. No respiratory distress.     Breath sounds: No stridor. Rales present. No wheezing.     Comments:  diffuse rales Abdominal:     General: Bowel sounds are normal. There is no distension.     Palpations: Abdomen is soft.     Tenderness: There is no abdominal tenderness. There is no guarding or rebound.  Musculoskeletal:        General: No tenderness.     Cervical back: Neck supple.     Right lower leg: Edema present.     Left lower leg: Edema present.     Comments: Mild edema bilateral lower extremities, no erythema, no tenderness  Skin:    General: Skin is warm and dry.     Findings: No rash.  Neurological:     Cranial Nerves: No cranial nerve deficit (no facial droop, extraocular movements intact, no slurred speech).     Sensory: No sensory deficit.     Motor: No abnormal muscle tone or seizure activity.     Coordination: Coordination normal.     ED Results / Procedures / Treatments   Labs (all labs ordered are listed, but only abnormal results are displayed) Labs Reviewed  CBC WITH DIFFERENTIAL/PLATELET - Abnormal; Notable for the following components:      Result Value   RBC 3.68 (*)    Hemoglobin 10.6 (*)  MCHC 29.0 (*)    nRBC 0.3 (*)    Lymphs Abs 0.5 (*)    Abs Immature Granulocytes 0.11 (*)    All other  components within normal limits  COMPREHENSIVE METABOLIC PANEL - Abnormal; Notable for the following components:   Glucose, Bld 125 (*)    BUN 37 (*)    Creatinine, Ser 1.79 (*)    Albumin 3.1 (*)    GFR calc non Af Amer 29 (*)    GFR calc Af Amer 34 (*)    All other components within normal limits  D-DIMER, QUANTITATIVE (NOT AT [facility]) - Abnormal; Notable for the following components:   D-Dimer, Quant 2.37 (*)    All other components within normal limits  LACTATE DEHYDROGENASE - Abnormal; Notable for the following components:   LDH 259 (*)    All other components within normal limits  FIBRINOGEN - Abnormal; Notable for the following components:   Fibrinogen 795 (*)    All other components within normal limits  C-REACTIVE PROTEIN - Abnormal; Notable for the following components:   CRP 6.5 (*)    All other components within normal limits  BRAIN NATRIURETIC PEPTIDE - Abnormal; Notable for the following components:   B Natriuretic Peptide 304.5 (*)    All other components within normal limits  SARS CORONAVIRUS 2 (TAT 6-24 HRS)  CULTURE, BLOOD (ROUTINE X 2)  CULTURE, BLOOD (ROUTINE X 2)  LACTIC ACID, PLASMA  PROCALCITONIN  FERRITIN  TRIGLYCERIDES  LACTIC ACID, PLASMA  POC SARS CORONAVIRUS 2 AG -  ED  POC SARS CORONAVIRUS 2 AG -  ED  I-STAT ARTERIAL BLOOD GAS, ED  TROPONIN I (HIGH SENSITIVITY)  TROPONIN I (HIGH SENSITIVITY)    EKG EKG Interpretation  Date/Time:  Tuesday Feb 16 2020 17:26:43 EDT Ventricular Rate:  69 PR Interval:    QRS Duration: 110 QT Interval:  430 QTC Calculation: 461 R Axis:   -2 Text Interpretation: Atrial flutter with predominant 3:1 AV block Low voltage, precordial leads ST depression, consider ischemia, lateral lds ST elevation, consider inferior injury Since last tracing rate slower Confirmed by Linwood Dibbles [phone]) on 02/18/2020 5:28:41 PM   Radiology DG Chest Port 1 View  Result Date: 01/31/2020 CLINICAL DATA:  Dyspnea, fever. EXAM: PORTABLE  CHEST 1 VIEW COMPARISON:  Radiograph 10/06/2019 FINDINGS: Significant interval change from prior exam with diffuse interstitial thickening and reticular opacities. There is also perivascular haziness. Cardiomegaly with unchanged mediastinal contours. Aortic atherosclerosis. Possible small pleural effusions. No visualized pneumothorax. IMPRESSION: New diffuse interstitial thickening and reticular opacities suspicious for pulmonary edema. Similar cardiomegaly. Possible small pleural effusions. Findings suspicious for CHF. Electronically Signed   By: Narda Rutherford M.D.   On: 02/04/2020 18:11    Procedures .Critical Care Performed by: Linwood Dibbles, MD Authorized by: Linwood Dibbles, MD   Critical care provider statement:    Critical care time (minutes):  45   Critical care was time spent personally by me on the following activities:  Discussions with consultants, evaluation of patient's response to treatment, examination of patient, ordering and performing treatments and interventions, ordering and review of laboratory studies, ordering and review of radiographic studies, pulse oximetry, re-evaluation of patient's condition, obtaining history from patient or surrogate and review of old charts   (including critical care time)  Medications Ordered in ED Medications  sodium chloride flush (NS) 0.9 % injection 3 mL (has no administration in time range)  sodium chloride flush (NS) 0.9 % injection 3 mL (has no administration in time  range)  0.9 %  sodium chloride infusion (has no administration in time range)  furosemide (LASIX) injection 40 mg (has no administration in time range)  dexamethasone (DECADRON) injection 6 mg (has no administration in time range)    ED Course  I have reviewed the triage vital signs and the nursing notes.  Pertinent labs & imaging results that were available during my care of the patient were reviewed by me and considered in my medical decision making (see chart for  details).  Clinical Course as of Feb 15 2158  Tue Feb 16, 2020  1822 Oxygen saturation in the mid 90s   [JK]  1910 Patient's labs reviewed.   [JK]  1911 Chest x-ray is concerning for CHF.  BNP is elevated as is D-dimer and fibrinogen.   [JK]  1911 No leukocytosis.  Hemoglobin is actually increased compared to previous.   [JK]  1911 BUN and creatinine are stable.   [JK]  1911 Patient remains on a nonrebreather but her oxygenation is stable.   [JK]    Clinical Course User Index [JK] Linwood DibblesKnapp, Itzel Lowrimore, MD   MDM Rules/Calculators/A&P                      Patient presented to ED for evaluation of increasing shortness of breath.  Patient has history of COPD as well as CHF.  She has not been vaccinated for Covid.  Patient is afebrile.  Chest x-ray suggest the possibility of pulmonary edema although Covid pneumonia would also be a concern.  Patient was treated with magnesium as well as bronchodilators by EMS.  She was also given Solu-Medrol.  Covid test is positive.  Consistent with Covid pneumonia on top of her COPD and CHF.  IV remdesivir and Decadron ordered.  Patient will be admitted for further treatment.  We will check an ABG to see if we can wean her off her facemask oxygen Final Clinical Impression(s) / ED Diagnoses Final diagnoses:  Chronic obstructive pulmonary disease with acute exacerbation (HCC)  Congestive heart failure, unspecified HF chronicity, unspecified heart failure type [facility](HCC)  Person under investigation for COVID-19  Pneumonia due to COVID-19 virus      Linwood DibblesKnapp, Trygve Thal, MD October 14, 2019 2200

## 2020-02-16 NOTE — Progress Notes (Signed)
RT unable to obtain ABG on patient.

## 2020-02-16 NOTE — ED Triage Notes (Signed)
Pt coming from home after an increase in shob over the past 3-5 days. Coming in respiratory distress. Pt wears 4L [place] chronically, found to be @ 76% on the 4L. Hx of a-fib. Pt had audible wheezing with EMS and received 2g mag, 0.5 atrovent, 5mg  albuterol, 125mg  and Solumedrol. RR 32, HR 80 a-fib rhythm, and placed on NRB @ 87%. Pt alert and oriented

## 2020-02-16 NOTE — Telephone Encounter (Signed)
These were sent to [facility] on 02/09/2020 with pharmacy confirmation of receipt.

## 2020-02-16 NOTE — Patient Instructions (Signed)
Pre visit review using our clinic review tool, if applicable. No additional management support is needed unless otherwise documented below in the visit note. 

## 2020-02-16 NOTE — ED Notes (Signed)
Patient stuck twice, unable to obtain lab work. Phlebotomy notified

## 2020-02-16 NOTE — ED Notes (Signed)
Report given to 5W RN. All questions answered 

## 2020-02-16 NOTE — Telephone Encounter (Signed)
These were sent on 02/09/2020 with pharmacy receipt confirmation but re-sent today.

## 2020-02-16 NOTE — Telephone Encounter (Signed)
Patient called about refills that she called about on 5/12  She stated she received a notification from [facility] that they have not received refills  albuterol (PROVENTIL) (2.5 MG/3ML) 0.083% nebulizer solution furosemide (LASIX) 80 MG tablet pantoprazole (PROTONIX) 40 MG tablet' simvastatin (ZOCOR) 20 MG tablet

## 2020-02-16 NOTE — ED Notes (Signed)
Pt 96% on 10L NRB mask. Trial of 6L [place] attempted. Pt immediately dropped to 82% with obvious increase in work of breathing. Placed back on NRB.

## 2020-02-16 NOTE — H&P (Signed)
History and Physical    Amy ShellingDeborah G Rhodes UJW:[id number] DOB: 1956-06-18 DOA: 02/15/2020  PCP: Judy Pimpleower, Marne A, MD Patient coming from: Home  Chief Complaint: Shortness of breath  HPI: Amy Bryant is a 64 y.o. female with medical history significant of A. fib and PE on Coumadin, chronic diastolic CHF, COPD, cor pulmonale, OHS, chronic respiratory failure on 4 L home oxygen, CKD stage III-IV, non-insulin-dependent diabetes, hypothyroidism, GERD, morbid obesity (BMI 47.69) presenting to the ED via EMS for evaluation of shortness of breath.  EMS reported oxygen saturation 76% on 4 L and audible wheezing.  Patient was tachypneic with respiratory rate in the 30s.  Rhythm A. fib but rate controlled.  She was placed on nonrebreather and given magnesium 2 g, Atrovent, albuterol, and Solu-Medrol 125 mg.    Patient reports history of dyspnea, wheezing, and productive cough for several days.  States her PCP prescribed a 5-day course of prednisone and a azithromycin which she finished recently but continues to be symptomatic.  She is also experiencing some mild chest tightness whenever she coughs.  Also complaining of orthopnea.  Reports low-grade fevers at home.  She has not been vaccinated for Covid.  Reports compliance with her home COPD inhalers and takes Lasix 80 mg twice daily.  Patient has no other complaints.  ED Course: Afebrile.  Not tachycardic.  Slightly tachypneic with respiratory rate in the low to mid 20s.  Satting low to mid 90s on 15 L oxygen via nonrebreather.  SARS-CoV-2 rapid antigen test positive.  No leukocytosis.  Procalcitonin <0.10.  Lactic acid normal.  Hemoglobin 10.6, previously in the 7-9 range.  Creatinine 1.7, at baseline.  LFTs normal.  INR supratherapeutic at 4.4.  Inflammatory markers elevated-D-dimer 2.37, LDH 259, fibrinogen 795, and CRP 6.5.  BNP 304.  High-sensitivity troponin negative.  Blood culture x2 pending.  Chest x-ray showing cardiomegaly and new diffuse interstitial  thickening and reticular opacities suspicious for pulmonary edema versus COVID-19 viral pneumonia.  Patient was given remdesivir and IV Lasix 40 mg.  Review of Systems:  All systems reviewed and apart from history of presenting illness, are negative.  Past Medical History:  Diagnosis Date  . Atrial fibrillation (HCC)    with cardioversion success  . Cellulitis 10/2019   right lower extremity  . CHF (congestive heart failure) (HCC)   . COPD (chronic obstructive pulmonary disease) (HCC)   . Cor pulmonale (HCC)    and obesity hypoventilation syndrome  . Diabetes mellitus without complication (HCC)   . GERD (gastroesophageal reflux disease)   . Hypothyroid   . Morbid obesity (HCC)   . OA (osteoarthritis)   . Pulmonary embolism (HCC)    hx of on coumadin  . Reactive airways dysfunction syndrome [facility](HCC)     Past Surgical History:  Procedure Laterality Date  . BIOPSY  01/12/2019   Procedure: BIOPSY;  Surgeon: Beverley FiedlerPyrtle, Jay M, MD;  [facility];  Service: Gastroenterology;;  . COLONOSCOPY WITH PROPOFOL N/A 01/12/2019   Procedure: COLONOSCOPY WITH PROPOFOL;  Surgeon: Beverley FiedlerPyrtle, Jay M, MD;  [facility];  Service: Gastroenterology;  Laterality: N/A;  . ESOPHAGOGASTRODUODENOSCOPY (EGD) WITH PROPOFOL N/A 01/12/2019   Procedure: ESOPHAGOGASTRODUODENOSCOPY (EGD) WITH PROPOFOL;  Surgeon: Beverley FiedlerPyrtle, Jay M, MD;  [facility];  Service: Gastroenterology;  Laterality: N/A;  . TONSILLECTOMY       reports that she has quit smoking. She has quit using smokeless tobacco. She reports that she does not drink alcohol or use drugs.  Allergies  Allergen Reactions  .  Pseudoephedrine Other (See Comments)    REACTION: tightness in chest  . Augmentin [Amoxicillin-Pot Clavulanate] Itching and Rash    Did it involve swelling of the face/tongue/throat, SOB, or low BP? No Did it involve sudden or severe rash/hives, skin peeling, or any reaction on the inside of your mouth or nose? Yes Did  you need to seek medical attention at a hospital or doctor's office? Yes When did it last happen?2019 If all above answers are "NO", may proceed with cephalosporin use.   . Latex Itching and Rash    Family History  Problem Relation Age of Onset  . Heart disease Father 56       MI  . Diabetes Brother     Prior to Admission medications   Medication Sig Start Date End Date Taking? Authorizing Provider  acidophilus (RISAQUAD) CAPS capsule Take 1 capsule by mouth daily. 10/10/19   Pokhrel, Rebekah Chesterfield, MD  albuterol (PROVENTIL) (2.5 MG/3ML) 0.083% nebulizer solution Take 3 mLs (2.5 mg total) by nebulization every 4 (four) hours as needed for wheezing or shortness of breath. 02/13/2020   [name], [name], MD  ALPRAZolam Prudy Feeler) 0.5 MG tablet Take 1 tablet (0.5 mg total) by mouth 2 (two) times daily as needed for anxiety. 10/19/19   [name], [name], MD  azithromycin (ZITHROMAX) 250 MG tablet 2 tabs po qd x 1d, then 1 tab po qd x 4d 02/06/20   McGowen, Maryjean Morn, MD  budesonide-formoterol (SYMBICORT) 160-4.5 MCG/ACT inhaler INHALE 2 PUFFS INTO LUNGS TWICE A DAY 01/06/20   [name], [name], MD  cholecalciferol (VITAMIN D) 1000 UNITS tablet Take 4,000 Units by mouth daily.    [provider]  ciclopirox (LOPROX) 0.77 % cream APPLY TO AFFECTED AREA TWICE A DAY AS NEEDED 11/17/19   [name], [name], MD  diltiazem (CARTIA XT) 120 MG 24 hr capsule Take 1 capsule (120 mg total) by mouth daily. 02/10/20   [name], [name], MD  FERREX 150 150 MG capsule TAKE 1 CAPSULE BY MOUTH TWICE A DAY 11/18/19   [name], [name], MD  FLUoxetine (PROZAC) 40 MG capsule Take 1 capsule (40 mg total) by mouth daily. 07/31/19   [name], [name], MD  furosemide (LASIX) 80 MG tablet Take 1 tablet (80 mg total) by mouth 2 (two) times daily. 02/11/2020   [name], [name], MD  glipiZIDE (GLUCOTROL XL) 5 MG 24 hr tablet Take 1 tablet (5 mg total) by mouth daily with breakfast. 01/06/20   [name], [name], MD  glucose blood test strip Test blood sugar once  daily and as needed for DM 250.0 11/19/14   [name], [name], MD  Hydrocortisone (GERHARDT'S BUTT CREAM) CREA Apply 1 application topically 3 (three) times daily as needed for irritation. 04/11/19   Rodolph Bong, MD  Lancets [facility] ULTRASOFT) lancets Test blood sugar once daily and as needed for DM 250.0     [provider]  levothyroxine (SYNTHROID) 175 MCG tablet TAKE 1 TABLET BY MOUTH DAILY BEFORE BREAKFAST. Patient taking differently: Take 175 mcg by mouth daily before breakfast.  07/24/19   [name], [name], MD  mometasone (ELOCON) 0.1 % cream Apply 1 application topically See admin instructions. Tiny amount to each ear canal twice weekly as needed.    [provider]  mupirocin ointment (BACTROBAN) 2 % APPLY TO AFFECTED AREA TWICE A DAY Patient taking differently: Apply 1 application topically as needed (skin).  01/01/19   [name], [name], MD  nystatin (MYCOSTATIN) 100000 UNIT/ML suspension  Take 5 mLs (500,000 Units total) by mouth 3 (three) times daily. Swish and swallow 10/19/19   [name], [name], MD  pantoprazole (PROTONIX) 40 MG tablet Take 1 tablet (40 mg total) by mouth daily at 6 (six) AM. 02-20-20   [name], [name], MD  potassium chloride SA (KLOR-CON) 20 MEQ tablet TAKE 2 TABLETS BY MOUTH EVERY DAY 01/14/20   [name], [name], MD  predniSONE (DELTASONE) 20 MG tablet 2 tabs po qd x 5d 02/06/20   McGowen, Maryjean Morn, MD  senna-docusate (SENOKOT-S) 8.6-50 MG tablet Take 1 tablet by mouth 2 (two) times daily. 04/11/19   Rodolph Bong, MD  simvastatin (ZOCOR) 20 MG tablet TAKE 1 TABLET BY MOUTH EVERYDAY AT BEDTIME Feb 20, 2020   [name], [name], MD  traMADol (ULTRAM) 50 MG tablet 1TAB BY MOUTH EVERY 12HRS AS NEEDED FOR MODERATE/SEVERE PAIN. CAUTION OF SEDATION/FALLS/CONSTIPATION 01/06/20   [name], [name], MD  warfarin (COUMADIN) 5 MG tablet Take 0.5-1 tablets (2.5-5 mg total) by mouth See admin instructions. MWF take 5mg . TTSS take 2.5mg  11/26/19   11/28/19, MD    Physical  Exam: Vitals:   02-20-2020 2249 02-20-2020 2300 2020/02/20 2330 02-20-20 2345  BP:  (!) 142/73 (!) 142/69 136/72  Pulse: 69 67 64 63  Resp: (!) 21 19 20 15   Temp:      TempSrc:      SpO2: 94% 92% 94% 94%  Weight:      Height:        Physical Exam  Constitutional: She is oriented to person, place, and time. She appears well-developed and well-nourished.  HENT:  Head: Normocephalic.  Eyes: Right eye exhibits no discharge. Left eye exhibits no discharge.  Cardiovascular: Normal rate, regular rhythm and intact distal pulses.  Pulmonary/Chest: She is in respiratory distress. She has wheezes.  Slightly tachypneic with respiratory rate in the mid 20s Satting 93% on 15 L oxygen via nonrebreather Mild end expiratory wheezing Coarse breath sounds diffusely  Abdominal: Soft. Bowel sounds are normal. She exhibits no distension. There is no abdominal tenderness. There is no guarding.  Musculoskeletal:     Cervical back: Neck supple.     Comments: +1 pedal edema  Neurological: She is alert and oriented to person, place, and time.  Skin: Skin is warm and dry. She is not diaphoretic.    Labs on Admission: I have personally reviewed following labs and imaging studies  CBC: Recent Labs  Lab 2020-02-20 1725  WBC 7.1  NEUTROABS 6.2  HGB 10.6*  HCT 36.5  MCV 99.2  PLT 307   Basic Metabolic Panel: Recent Labs  Lab Feb 20, 2020 1725  NA 141  K 4.1  CL 105  CO2 22  GLUCOSE 125*  BUN 37*  CREATININE 1.79*  CALCIUM 9.8   GFR: Estimated Creatinine Clearance: 44 mL/min (A) (by C-G formula based on SCr of 1.79 mg/dL (H)). Liver Function Tests: Recent Labs  Lab 02-20-20 1725  AST 19  ALT 13  ALKPHOS 68  BILITOT 0.6  PROT 7.3  ALBUMIN 3.1*   No results for input(s): LIPASE, AMYLASE in the last 168 hours. No results for input(s): AMMONIA in the last 168 hours. Coagulation Profile: Recent Labs  Lab Feb 20, 2020 0000  INR 4.4*   Cardiac Enzymes: No results for input(s): CKTOTAL, CKMB,  CKMBINDEX, TROPONINI in the last 168 hours. BNP (last 3 results) No results for input(s): PROBNP in the last 8760 hours. HbA1C: No results for input(s): HGBA1C in the last 72 hours. CBG:  No results for input(s): GLUCAP in the last 168 hours. Lipid Profile: Recent Labs    2020-02-26 1725  TRIG 143   Thyroid Function Tests: No results for input(s): TSH, T4TOTAL, FREET4, T3FREE, THYROIDAB in the last 72 hours. Anemia Panel: Recent Labs    02/26/2020 1725  FERRITIN 74   Urine analysis:    Component Value Date/Time   COLORURINE YELLOW 10/07/2019 0416   APPEARANCEUR HAZY (A) 10/07/2019 0416   LABSPEC 1.012 10/07/2019 0416   PHURINE 5.0 10/07/2019 0416   GLUCOSEU NEGATIVE 10/07/2019 0416   GLUCOSEU NEGATIVE 02/21/2007 1652   HGBUR MODERATE (A) 10/07/2019 0416   HGBUR negative 04/21/2010 1548   BILIRUBINUR NEGATIVE 10/07/2019 0416   BILIRUBINUR Negative 12/03/2017 1443   KETONESUR NEGATIVE 10/07/2019 0416   PROTEINUR 30 (A) 10/07/2019 0416   UROBILINOGEN 0.2 12/03/2017 1443   UROBILINOGEN 0.2 04/21/2010 1548   NITRITE NEGATIVE 10/07/2019 0416   LEUKOCYTESUR NEGATIVE 10/07/2019 0416    Radiological Exams on Admission: DG Chest Port 1 View  Result Date: 02/26/20 CLINICAL DATA:  Dyspnea, fever. EXAM: PORTABLE CHEST 1 VIEW COMPARISON:  Radiograph 10/06/2019 FINDINGS: Significant interval change from prior exam with diffuse interstitial thickening and reticular opacities. There is also perivascular haziness. Cardiomegaly with unchanged mediastinal contours. Aortic atherosclerosis. Possible small pleural effusions. No visualized pneumothorax. IMPRESSION: New diffuse interstitial thickening and reticular opacities suspicious for pulmonary edema. Similar cardiomegaly. Possible small pleural effusions. Findings suspicious for CHF. Electronically Signed   By: Narda Rutherford M.D.   On: 2020/02/26 18:11    EKG: Independently reviewed.  Atrial fibrillation, heart rate 69.  No acute  ischemic changes.  Assessment/Plan Principal Problem:   Pneumonia due to COVID-19 virus Active Problems:   Atrial fibrillation (HCC)   Acute on chronic respiratory failure (HCC)   Acute exacerbation of CHF (congestive heart failure) (HCC)   COPD with acute exacerbation (HCC)   COVID-19 viral pneumonia: SARS-CoV-2 rapid antigen test positive.  Chest x-ray showing diffuse airspace opacities concerning for Covid pneumonia.  No fever or leukocytosis to suggest bacterial pneumonia.  Procalcitonin <0.10. Inflammatory markers elevated-D-dimer 2.37, LDH 259, fibrinogen 795, and CRP 6.5.  LFTs normal. -Remdesivir dosing per pharmacy -IV Solu-Medrol -Actemra off label use - patient denies any known history of tuberculosis or hepatitis, understands the risks and benefits and wants to proceed with Actemra -Vitamin C, zinc, vitamin D -Antitussives as needed -Tylenol as needed -Incentive spirometry, flutter valve -Self proning for 2 to 3 hours every 12 hours -Daily CBC with differential, CMP, CRP, D-dimer, LDH -Blood culture x2 pending  Acute exacerbation of chronic diastolic CHF: BNP 304, likely falsely low given morbid obesity.  Chest x-ray with findings concerning for pulmonary edema/CHF.  Last echo done April 2020 with LVEF 60 to 65% and normal diastolic parameters. -Continue cardiac monitoring.  Patient was given IV Lasix 40 mg in the ED.  Continue IV Lasix 40 mg twice daily starting in the morning.  Monitor intake and output, daily weights, and low-sodium diet with fluid restriction.  Order repeat echocardiogram.  Acute COPD exacerbation: Wheezing on exam.  Work of breathing has improved after IV steroid and bronchodilator treatments. -Combivent inhaler every 6 hours scheduled, albuterol inhaler as needed, Pulmicort inhaler twice daily, and IV Solu-Medrol 60 mg every 6 hours.  Will hold off antibiotics given no leukocytosis and negative procalcitonin.  Acute on chronic hypoxic respiratory  failure: Multifactorial in the setting of COVID-19 viral pneumonia, acute CHF exacerbation, acute COPD exacerbation, and underlying OHS.  Hypoxic to the  70s on 4 L home oxygen.  Currently satting in the low to mid 90s on 15 L oxygen via nonrebreather. -Continuous pulse ox.  Continue supplemental oxygen to keep oxygen saturation above 90%.  ABG ordered and currently pending.  CKD stage III-IV: Stable.  Creatinine 1.7, at baseline. -Continue to monitor renal function  Atrial fibrillation: Currently rate controlled.  On Coumadin and INR supratherapeutic at 4.4. -Hold Coumadin at this time, dosing per pharmacy.  Repeat PT/INR in a.m.  History of PE: INR currently supratherapeutic at 4.4. -Hold Coumadin at this time, dosing per pharmacy.  Repeat PT/INR in a.m.  Non-insulin-dependent diabetes: Check A1c.  Sliding scale insulin sensitive ACHS and CBG checks.  Pharmacy med rec pending.  DVT prophylaxis: On chronic anticoagulation with Coumadin. Code Status: Patient wishes to be full code. Family Communication: No family available at this time. Disposition Plan: Status is: Inpatient  Remains inpatient appropriate because:IV treatments appropriate due to intensity of illness or inability to take PO and Inpatient level of care appropriate due to severity of illness   Dispo: The patient is from: Home              Anticipated d/c is to: SNF              Anticipated d/c date is: > 3 days              Patient currently is not medically stable to d/c.  The medical decision making on this patient was of high complexity and the patient is at high risk for clinical deterioration, therefore this is a level 3 visit.  Shela Leff MD [facility]  If 7PM-7AM, please contact night-coverage [email or web address]  02/17/2020, 12:13 AM

## 2020-02-17 ENCOUNTER — Inpatient Hospital Stay (HOSPITAL_COMMUNITY): Payer: Medicare HMO

## 2020-02-17 ENCOUNTER — Encounter (HOSPITAL_COMMUNITY): Payer: Self-pay | Admitting: Internal Medicine

## 2020-02-17 DIAGNOSIS — J441 Chronic obstructive pulmonary disease with (acute) exacerbation: Secondary | ICD-10-CM | POA: Diagnosis present

## 2020-02-17 DIAGNOSIS — I509 Heart failure, unspecified: Secondary | ICD-10-CM | POA: Insufficient documentation

## 2020-02-17 DIAGNOSIS — I361 Nonrheumatic tricuspid (valve) insufficiency: Secondary | ICD-10-CM

## 2020-02-17 LAB — CBC WITH DIFFERENTIAL/PLATELET
Abs Immature Granulocytes: 0.07 10*3/uL (ref 0.00–0.07)
Basophils Absolute: 0 10*3/uL (ref 0.0–0.1)
Basophils Relative: 0 %
Eosinophils Absolute: 0 10*3/uL (ref 0.0–0.5)
Eosinophils Relative: 0 %
HCT: 31.2 % — ABNORMAL LOW (ref 36.0–46.0)
Hemoglobin: 9.4 g/dL — ABNORMAL LOW (ref 12.0–15.0)
Immature Granulocytes: 3 %
Lymphocytes Relative: 11 %
Lymphs Abs: 0.3 10*3/uL — ABNORMAL LOW (ref 0.7–4.0)
MCH: 28.8 pg (ref 26.0–34.0)
MCHC: 30.1 g/dL (ref 30.0–36.0)
MCV: 95.7 fL (ref 80.0–100.0)
Monocytes Absolute: 0.1 10*3/uL (ref 0.1–1.0)
Monocytes Relative: 3 %
Neutro Abs: 2.2 10*3/uL (ref 1.7–7.7)
Neutrophils Relative %: 83 %
Platelets: 283 10*3/uL (ref 150–400)
RBC: 3.26 MIL/uL — ABNORMAL LOW (ref 3.87–5.11)
RDW: 13.6 % (ref 11.5–15.5)
WBC: 2.7 10*3/uL — ABNORMAL LOW (ref 4.0–10.5)
nRBC: 0 % (ref 0.0–0.2)

## 2020-02-17 LAB — GLUCOSE, CAPILLARY
Glucose-Capillary: 129 mg/dL — ABNORMAL HIGH (ref 70–99)
Glucose-Capillary: 127 mg/dL — ABNORMAL HIGH (ref 70–99)
Glucose-Capillary: 158 mg/dL — ABNORMAL HIGH (ref 70–99)
Glucose-Capillary: 200 mg/dL — ABNORMAL HIGH (ref 70–99)

## 2020-02-17 LAB — COMPREHENSIVE METABOLIC PANEL
ALT: 11 U/L (ref 0–44)
AST: 13 U/L — ABNORMAL LOW (ref 15–41)
Albumin: 2.8 g/dL — ABNORMAL LOW (ref 3.5–5.0)
Alkaline Phosphatase: 66 U/L (ref 38–126)
Anion gap: 12 (ref 5–15)
BUN: 39 mg/dL — ABNORMAL HIGH (ref 8–23)
CO2: 24 mmol/L (ref 22–32)
Calcium: 9.5 mg/dL (ref 8.9–10.3)
Chloride: 103 mmol/L (ref 98–111)
Creatinine, Ser: 1.79 mg/dL — ABNORMAL HIGH (ref 0.44–1.00)
GFR calc Af Amer: 34 mL/min — ABNORMAL LOW (ref >60)
GFR calc non Af Amer: 29 mL/min — ABNORMAL LOW (ref >60)
Glucose, Bld: 157 mg/dL — ABNORMAL HIGH (ref 70–99)
Potassium: 4.2 mmol/L (ref 3.5–5.1)
Sodium: 139 mmol/L (ref 135–145)
Total Bilirubin: 0.4 mg/dL (ref 0.3–1.2)
Total Protein: 6.7 g/dL (ref 6.5–8.1)

## 2020-02-17 LAB — HIV ANTIBODY (ROUTINE TESTING W REFLEX): HIV Screen 4th Generation wRfx: NONREACTIVE

## 2020-02-17 LAB — HEMOGLOBIN A1C
Hgb A1c MFr Bld: 5.8 % — ABNORMAL HIGH (ref 4.8–5.6)
Mean Plasma Glucose: 120 mg/dL

## 2020-02-17 LAB — LACTIC ACID, PLASMA: Lactic Acid, Venous: 1.3 mmol/L (ref 0.5–1.9)

## 2020-02-17 LAB — TROPONIN I (HIGH SENSITIVITY): Troponin I (High Sensitivity): 10 ng/L (ref <18)

## 2020-02-17 LAB — ECHOCARDIOGRAM COMPLETE
Height: 65.5 in
Weight: 4645.53 oz

## 2020-02-17 LAB — C-REACTIVE PROTEIN: CRP: 6.7 mg/dL — ABNORMAL HIGH (ref <1.0)

## 2020-02-17 LAB — D-DIMER, QUANTITATIVE: D-Dimer, Quant: 3.74 ug/mL-FEU — ABNORMAL HIGH (ref 0.00–0.50)

## 2020-02-17 LAB — BRAIN NATRIURETIC PEPTIDE: B Natriuretic Peptide: 233.8 pg/mL — ABNORMAL HIGH (ref 0.0–100.0)

## 2020-02-17 LAB — SARS CORONAVIRUS 2 (TAT 6-24 HRS): SARS Coronavirus 2: POSITIVE — AB

## 2020-02-17 LAB — PROTIME-INR
INR: 3.9 — ABNORMAL HIGH (ref 0.8–1.2)
Prothrombin Time: 36.9 seconds — ABNORMAL HIGH (ref 11.4–15.2)

## 2020-02-17 MED ORDER — PANTOPRAZOLE SODIUM 40 MG PO TBEC
40.0000 mg | DELAYED_RELEASE_TABLET | Freq: Every day | ORAL | Status: DC
  Administered 2020-02-17 – 2020-03-01 (×14): 40 mg via ORAL
  Filled 2020-02-17 (×13): qty 1

## 2020-02-17 MED ORDER — FUROSEMIDE 10 MG/ML IJ SOLN
40.0000 mg | Freq: Once | INTRAMUSCULAR | Status: AC
  Administered 2020-02-17: 40 mg via INTRAVENOUS
  Filled 2020-02-17: qty 4

## 2020-02-17 MED ORDER — TRAMADOL HCL 50 MG PO TABS
50.0000 mg | ORAL_TABLET | Freq: Four times a day (QID) | ORAL | Status: DC | PRN
  Administered 2020-02-17 – 2020-03-01 (×28): 50 mg via ORAL
  Filled 2020-02-17 (×29): qty 1

## 2020-02-17 MED ORDER — FLUTICASONE PROPIONATE 50 MCG/ACT NA SUSP
1.0000 | Freq: Every day | NASAL | Status: DC
  Administered 2020-02-17 – 2020-03-01 (×14): 1 via NASAL
  Filled 2020-02-17 (×2): qty 16

## 2020-02-17 MED ORDER — DILTIAZEM HCL ER COATED BEADS 120 MG PO CP24
120.0000 mg | ORAL_CAPSULE | Freq: Every day | ORAL | Status: DC
  Administered 2020-02-17 – 2020-02-21 (×5): 120 mg via ORAL
  Filled 2020-02-17 (×5): qty 1

## 2020-02-17 MED ORDER — METHYLPREDNISOLONE SODIUM SUCC 125 MG IJ SOLR
60.0000 mg | Freq: Two times a day (BID) | INTRAMUSCULAR | Status: DC
  Administered 2020-02-17 – 2020-02-19 (×5): 60 mg via INTRAVENOUS
  Filled 2020-02-17 (×5): qty 2

## 2020-02-17 MED ORDER — FLUOXETINE HCL 20 MG PO CAPS
40.0000 mg | ORAL_CAPSULE | Freq: Every day | ORAL | Status: DC
  Administered 2020-02-17 – 2020-03-01 (×14): 40 mg via ORAL
  Filled 2020-02-17 (×15): qty 2

## 2020-02-17 MED ORDER — ALPRAZOLAM 0.5 MG PO TABS
0.5000 mg | ORAL_TABLET | Freq: Three times a day (TID) | ORAL | Status: DC | PRN
  Administered 2020-02-17 – 2020-03-02 (×29): 0.5 mg via ORAL
  Filled 2020-02-17 (×29): qty 1

## 2020-02-17 MED ORDER — METHYLPREDNISOLONE SODIUM SUCC 125 MG IJ SOLR: 60.0000 mg | Freq: Three times a day (TID) | INTRAMUSCULAR | Status: DC

## 2020-02-17 MED ORDER — LEVOTHYROXINE SODIUM 75 MCG PO TABS
175.0000 ug | ORAL_TABLET | Freq: Every day | ORAL | Status: DC
  Administered 2020-02-17 – 2020-03-02 (×15): 175 ug via ORAL
  Filled 2020-02-17 (×14): qty 1

## 2020-02-17 MED ORDER — WARFARIN SODIUM 2 MG PO TABS
2.0000 mg | ORAL_TABLET | Freq: Once | ORAL | Status: AC
  Administered 2020-02-17: 2 mg via ORAL
  Filled 2020-02-17: qty 1

## 2020-02-17 MED ORDER — SALINE SPRAY 0.65 % NA SOLN
1.0000 | NASAL | Status: DC | PRN
  Administered 2020-02-17: 1 via NASAL
  Filled 2020-02-17: qty 44

## 2020-02-17 MED ORDER — OXYMETAZOLINE HCL 0.05 % NA SOLN
1.0000 | Freq: Two times a day (BID) | NASAL | Status: DC | PRN
  Filled 2020-02-17: qty 30

## 2020-02-17 MED ORDER — FUROSEMIDE 10 MG/ML IJ SOLN
80.0000 mg | Freq: Two times a day (BID) | INTRAMUSCULAR | Status: DC
  Administered 2020-02-17 – 2020-02-18 (×3): 80 mg via INTRAVENOUS
  Filled 2020-02-17 (×3): qty 8

## 2020-02-17 MED ORDER — WARFARIN - PHARMACIST DOSING INPATIENT: Freq: Every day | Status: DC

## 2020-02-17 NOTE — Progress Notes (Addendum)
PROGRESS NOTE                                                                                                                                                                                                             Patient Demographics:    Amy Bryant, is a 64 y.o. female, DOB - 09/23/1956, FUX:[id number]  Outpatient Primary MD for the patient is [name], [name], MD   Admit date - Feb 22, 2020   LOS - 1  No chief complaint on file.      Brief Narrative: Patient is a 64 y.o. female with PMHx of COPD with chronic hypoxic respiratory failure on 4 L of oxygen at home, chronic diastolic heart failure, PAF on Coumadin, history of PE, CKD stage IIIb, non-insulin-dependent DM-2, morbid obesity-who presented to the hospital with approximately 2-3-day history of worsening shortness of breath.  For approximately 1 week-she has been having nasal congestion/URI symptoms-her PCP called in prednisone and Zithromax which she took without any relief.  In the emergency room-she was noted to have severe hypoxemia-requiring NRB-she was subsequently admitted to the hospitalist service.  Note-has not received the Covid vaccine.  Significant Events: 5/18>> admit to [facility] for severe hypoxemia requiring nonrebreather mask.  COVID-19 medications: Steroids: 5/18>> Remdesivir: 5/18>> Actemra: 5/18 x 1  Antibiotics: None  Microbiology data: 5/18: Blood culture>> no growth  Significant studies: 5/19: Chest x-ray>> interstitial pulmonary edema/patchy airspace opacity in the left base 5/18: Chest x-ray>> new diffuse interstitial thickening/reticular opacities suspicious for pulmonary edema  DVT prophylaxis: Coumadin-supratherapeutic INR.  Procedures: None  Consults: None    Subjective:    [name] today thinks that she feels better-she is on heated high flow-but appears comfortable.   Assessment  & Plan :   Acute on chronic  hypoxic Resp Failure due to Covid 19 Viral pneumonia and decompensated diastolic heart failure: Severely hypoxemic-on heated high flow but appears comfortable/not in any distress.  Plans are to continue steroids/remdesivir/diuretics.  Monitor closely-if hypoxemia worsens or she develops respiratory distress-she will need to be transferred to the ICU.  Fever: afebrile Prone/Incentive Spirometry: She is unable to prone or lie on her side-have encouraged incentive spirometry/flutter use 5 times an hour  O2 requirements:  SpO2: 91 % O2 Flow Rate (L/min): 30 L/min FiO2 (%): 100 %   COVID-19 Labs: Recent Labs    Feb 22, 2020  1725  DDIMER 2.37*  FERRITIN 74  LDH 259*  CRP 6.5*       Component Value Date/Time   BNP 304.5 (H) 11/19/2019 1725    Recent Labs  Lab 02/24/20 1725  PROCALCITON <0.10    Lab Results  Component Value Date   SARSCOV2NAA POSITIVE (A) 11/19/2019   SARSCOV2NAA NEGATIVE 10/06/2019   SARSCOV2NAA NOT DETECTED 04/08/2019   SARSCOV2NAA NOT DETECTED 04/02/2019    Decompensated diastolic heart failure: Mild lower extremity edema this morning-but no other obvious signs of volume overload.  Continue IV Lasix-maintain a negative balance-follow volume status/electrolytes/intake/output.  COPD: Not in exacerbation this morning-no wheezing-decrease steroids to twice daily dosing.  Continue bronchodilators.  Normally on 4 L of oxygen at all times at home.  CKD stage IIIb: Follow electrolytes closely while on diuretics.  PAF: Sinus rhythm overnight-resume Cardizem-INR supratherapeutic-Coumadin on hold-pharmacy following.  Remote history of pulmonary embolism: INR supratherapeutic-Coumadin on hold-pharmacy following.  Hypothyroidism: Continue Synthroid  Depression/anxiety: Slightly anxious due to acute illness/hypoxia/oxygen requirement-resume as needed Xanax and Prozac.  DM-2 (A1c 5.2 on 10/07/2019): CBGs stable on SSI-follow and adjust.  Recent Labs    02/17/20 0734    GLUCAP 129*   Morbid Obesity: Estimated body mass index is 47.58 kg/m as calculated from the following:   Height as of this encounter: 5' 5.5" (1.664 m).   Weight as of this encounter: 131.7 kg.    GI prophylaxis: PPI  ABG:    Component Value Date/Time   PHART 7.360 04/02/2019 1702   PCO2ART 45.1 04/02/2019 1702   PO2ART 122 (H) 04/02/2019 1702   HCO3 24.8 04/02/2019 1702   TCO2 29.1 10/01/2009 0925   O2SAT 98.7 04/02/2019 1702    Vent Settings: N/A  Condition - Extremely Guarded-very tenuous with risk for further deterioration  Family Communication  : Left a voicemail for son ([phone])  Code Status :  Full Code  Diet :  Diet Order            Diet heart healthy/carb modified Room service appropriate? Yes; Fluid consistency: Thin; Fluid restriction: 1200 mL Fluid  Diet effective now               Disposition Plan  :   Status is: Inpatient  Remains inpatient appropriate because:Inpatient level of care appropriate due to severity of illness   Dispo: The patient is from: Home              Anticipated d/c is to: SNF              Anticipated d/c date is: > 3 days              Patient currently is not medically stable to d/c.   Barriers to discharge: Severe Hypoxia requiring O2 supplementation/complete 5 days of IV Remdesivir  Antimicorbials  :    Anti-infectives (From admission, onward)   Start     Dose/Rate Route Frequency Ordered Stop   02/17/20 1000  remdesivir 100 mg in sodium chloride 0.9 % 100 mL IVPB     100 mg 200 mL/hr over 30 Minutes Intravenous Daily 02/24/20 2202 02/21/20 0959   02/24/20 2215  remdesivir 200 mg in sodium chloride 0.9% 250 mL IVPB     200 mg 580 mL/hr over 30 Minutes Intravenous Once 02/24/20 2202 02/24/20 2319      Inpatient Medications  Scheduled Meds: . vitamin C  500 mg Oral Daily  . budesonide  2 puff Inhalation BID  . cholecalciferol  1,000 Units Oral Daily  . furosemide  80 mg Intravenous BID  . insulin aspart   0-5 Units Subcutaneous QHS  . insulin aspart  0-9 Units Subcutaneous TID WC  . Ipratropium-Albuterol  2 puff Inhalation Q6H  . methylPREDNISolone (SOLU-MEDROL) injection  60 mg Intravenous Q8H  . sodium chloride flush  3 mL Intravenous Q12H  . Warfarin - Pharmacist Dosing Inpatient   Does not apply q1600  . zinc sulfate  220 mg Oral Daily   Continuous Infusions: . sodium chloride    . remdesivir 100 mg in NS 100 mL 100 mg (02/17/20 0831)   PRN Meds:.sodium chloride, acetaminophen, albuterol, ALPRAZolam, chlorpheniramine-HYDROcodone, guaiFENesin-dextromethorphan, sodium chloride flush, traMADol   Time Spent in minutes  45   The patient is critically ill with multiple organ system failure and requires high complexity decision making for assessment and support, frequent evaluation and titration of therapies, advanced monitoring, review of radiographic studies and interpretation of complex data.   See all Orders from today for further details   Jeoffrey Massed M.D on 02/17/2020 at 10:29 AM  To page go to [email or web address] - use universal password  [facility] -  Office  [phone]    Objective:   Vitals:   02/17/20 0437 02/17/20 0700 02/17/20 0715 02/17/20 0800  BP: 132/72 118/60    Pulse: 63 63 66   Resp: 13 16 18    Temp: 98 F (36.7 C) 98.6 F (37 C)    TempSrc: Oral Oral    SpO2: (!) 88% 95% 91%   Weight:    131.7 kg  Height:        Wt Readings from Last 3 Encounters:  02/17/20 131.7 kg  01/28/20 132 kg  01/06/20 132 kg     Intake/Output Summary (Last 24 hours) at 02/17/2020 1029 Last data filed at 02/17/2020 0500 Gross per 24 hour  Intake 624.73 ml  Output 700 ml  Net -75.27 ml     Physical Exam Gen Exam:Alert awake-not in any distress HEENT:atraumatic, normocephalic Chest: Bibasilar rales-no rhonchi CVS:S1S2 regular Abdomen:soft non tender, non distended Extremities: Trace edema Neurology: Non focal Skin: no rash   Data Review:     CBC Recent Labs  Lab 02/28/2020 1725  WBC 7.1  HGB 10.6*  HCT 36.5  PLT 307  MCV 99.2  MCH 28.8  MCHC 29.0*  RDW 13.7  LYMPHSABS 0.5*  MONOABS 0.3  EOSABS 0.0  BASOSABS 0.0    Chemistries  Recent Labs  Lab 02/07/2020 1725  NA 141  K 4.1  CL 105  CO2 22  GLUCOSE 125*  BUN 37*  CREATININE 1.79*  CALCIUM 9.8  AST 19  ALT 13  ALKPHOS 68  BILITOT 0.6   ------------------------------------------------------------------------------------------------------------------ Recent Labs    02/29/2020 1725  TRIG 143    Lab Results  Component Value Date   HGBA1C 5.2 10/07/2019   ------------------------------------------------------------------------------------------------------------------ No results for input(s): TSH, T4TOTAL, T3FREE, THYROIDAB in the last 72 hours.  Invalid input(s): FREET3 ------------------------------------------------------------------------------------------------------------------ Recent Labs    02/02/2020 1725  FERRITIN 74    Coagulation profile Recent Labs  Lab 02/02/2020 0000  INR 4.4*    Recent Labs    02/01/2020 1725  DDIMER 2.37*    Cardiac Enzymes No results for input(s): CKMB, TROPONINI, MYOGLOBIN in the last 168 hours.  Invalid input(s): CK ------------------------------------------------------------------------------------------------------------------    Component Value Date/Time   BNP 304.5 (H) 02/14/2020 1725    Micro Results Recent Results (from the past 240 hour(s))  SARS CORONAVIRUS 2 (TAT  6-24 HRS) Nasopharyngeal Nasopharyngeal Swab     Status: Abnormal   Collection Time: 02/22/2020  5:25 PM   Specimen: Nasopharyngeal Swab  Result Value Ref Range Status   SARS Coronavirus 2 POSITIVE (A) NEGATIVE Final    Comment: RESULT CALLED TO, READ BACK BY AND VERIFIED WITH: RN S GRINDSTAFF @0028  02/17/20 BY S GEZAHEGN (NOTE) SARS-CoV-2 target nucleic acids are DETECTED. The SARS-CoV-2 RNA is generally detectable in upper  and lower respiratory specimens during the acute phase of infection. Positive results are indicative of the presence of SARS-CoV-2 RNA. Clinical correlation with patient history and other diagnostic information is  necessary to determine patient infection status. Positive results do not rule out bacterial infection or co-infection with other viruses.  The expected result is Negative. Fact Sheet for Patients: 02/19/20 Fact Sheet for Healthcare Providers: HairSlick.no This test is not yet approved or cleared by the quierodirigir.com FDA and  has been authorized for detection and/or diagnosis of SARS-CoV-2 by FDA under an Emergency Use Authorization (EUA). This EUA will remain  in effect (meaning this test can be used)  for the duration of the COVID-19 declaration under Section 564(b)(1) of the Act, 21 U.S.C. section 360bbb-3(b)(1), unless the authorization is terminated or revoked sooner. Performed at [facility], [address] [place], [place]   Blood Culture (routine x 2)     Status: None (Preliminary result)   Collection Time: 02/05/2020  5:28 PM   Specimen: BLOOD  Result Value Ref Range Status   Specimen Description BLOOD RIGHT ANTECUBITAL  Final   Special Requests   Final    BOTTLES DRAWN AEROBIC AND ANAEROBIC Blood Culture adequate volume   Culture   Final    NO GROWTH < 24 HOURS Performed at [facility], [address] [place], [place]    Report Status PENDING  Incomplete  Blood Culture (routine x 2)     Status: None (Preliminary result)   Collection Time: 02/19/2020  5:47 PM   Specimen: BLOOD LEFT FOREARM  Result Value Ref Range Status   Specimen Description BLOOD LEFT FOREARM  Final   Special Requests   Final    BOTTLES DRAWN AEROBIC AND ANAEROBIC Blood Culture adequate volume   Culture   Final    NO GROWTH < 24 HOURS Performed at [facility], [address]  [place], [place]    Report Status PENDING  Incomplete    Radiology Reports DG Chest Port 1 View  Result Date: 01/30/2020 CLINICAL DATA:  Dyspnea, fever. EXAM: PORTABLE CHEST 1 VIEW COMPARISON:  Radiograph 10/06/2019 FINDINGS: Significant interval change from prior exam with diffuse interstitial thickening and reticular opacities. There is also perivascular haziness. Cardiomegaly with unchanged mediastinal contours. Aortic atherosclerosis. Possible small pleural effusions. No visualized pneumothorax. IMPRESSION: New diffuse interstitial thickening and reticular opacities suspicious for pulmonary edema. Similar cardiomegaly. Possible small pleural effusions. Findings suspicious for CHF. Electronically Signed   By: 12/04/2019 M.D.   On: 02/15/2020 18:11   DG Chest Port 1V same Day  Result Date: 02/17/2020 CLINICAL DATA:  Shortness of breath EXAM: PORTABLE CHEST 1 VIEW COMPARISON:  Feb 16, 2020 FINDINGS: There is cardiomegaly with pulmonary venous hypertension. There is interstitial edema with patchy airspace opacity in the left base. No adenopathy. There is aortic atherosclerosis. No bone lesions. IMPRESSION: Cardiomegaly with pulmonary vascular congestion. There is interstitial pulmonary edema. Suspect a degree of congestive heart failure. Patchy airspace opacity in the left base  is concerning for superimposed pneumonia, although alveolar edema in this area could present in this manner. Both pneumonia and alveolar edema could present concurrently. In comparison with 1 day prior, there appears to be somewhat less interstitial edema. Aortic Atherosclerosis (ICD10-I70.0). Electronically Signed   By: Bretta Bang III M.D.   On: 02/17/2020 08:01

## 2020-02-17 NOTE — Progress Notes (Addendum)
Patient arrived to unit via stretcher and scooted herself from the stretcher onto the bed.  Once she was hooked up to the monitor her O2 sats were in the 50s on 15L NRB.  15L HFNC immediately added and O2 sats slowly increased to low-mid 70s.  Patient had labored breathing and stated she could not catch her breath.  Pulse ox probe change x2.  Pulled patient up in bed and coached her on breathing slowly.  RT and rapid nurse notified and came to bedside.  RT set up HHFNC initially at 40L at 100% with 15L NRB.  She was able to get patient down to 30L HHFNC at 100% without the NRB and patient O2 sats are around 88%.  Dr. Antionette Char notified and came to patient's bedside to assess patient.  Extra dose of Lasix IVP given.  See chart for assessment.  Awaiting pharmacy for Actemra infusion.  Consent signed in ED and in chart.  Revision: No consent for Actemra.  Darcel Bayley, Consulting civil engineer, was unaware of a consent needed for Actemra off-label use for COVID-19.  Charge RN called pharmacy, ED, 62M (ICU for COVID patients), and AC, and all did not know of a consent that needed to be signed.  Per MD note, Actemra was explained to patient and patient agreed to receive med.  Patient verbalized consent and understanding of this med to this RN as well.    Actemra infusion is almost complete and patient is tolerating fine and has been monitored closely.  No changes in assessment.   All admission and shift assessments are completed.  Only waiting on pharmacy to complete their part of the PTA med list.

## 2020-02-17 NOTE — Progress Notes (Signed)
ANTICOAGULATION CONSULT NOTE - Initial Consult  Pharmacy Consult for Coumadin Indication: atrial fibrillation  Allergies  Allergen Reactions  . Pseudoephedrine Other (See Comments)    REACTION: tightness in chest  . Augmentin [Amoxicillin-Pot Clavulanate] Itching and Rash    Did it involve swelling of the face/tongue/throat, SOB, or low BP? No Did it involve sudden or severe rash/hives, skin peeling, or any reaction on the inside of your mouth or nose? Yes Did you need to seek medical attention at a hospital or doctor's office? Yes When did it last happen?2019 If all above answers are "NO", may proceed with cephalosporin use.   . Latex Itching and Rash    Patient Measurements: Height: 5' 5.5" (166.4 cm) Weight: 132 kg (291 lb) IBW/kg (Calculated) : 58.15  Vital Signs: Temp: 97.7 F (36.5 C) (05/18 1730) Temp Source: Tympanic (05/18 1730) BP: 136/72 (05/18 2345) Pulse Rate: 63 (05/18 2345)  Labs: Recent Labs    03/08/2020 0000 March 08, 2020 1725  HGB  --  10.6*  HCT  --  36.5  PLT  --  307  INR 4.4*  --   CREATININE  --  1.79*  TROPONINIHS  --  9    Estimated Creatinine Clearance: 44 mL/min (A) (by C-G formula based on SCr of 1.79 mg/dL (H)).   Medical History: Past Medical History:  Diagnosis Date  . Atrial fibrillation (HCC)    with cardioversion success  . Cellulitis 10/2019   right lower extremity  . CHF (congestive heart failure) (HCC)   . COPD (chronic obstructive pulmonary disease) (HCC)   . Cor pulmonale (HCC)    and obesity hypoventilation syndrome  . Diabetes mellitus without complication (HCC)   . GERD (gastroesophageal reflux disease)   . Hypothyroid   . Morbid obesity (HCC)   . OA (osteoarthritis)   . Pulmonary embolism (HCC)    hx of on coumadin  . Reactive airways dysfunction syndrome [facility])     Assessment: 64yo female admitted with Covid, to continue Coumadin for Afib.  Pt had [facility] clinic visit yesterday w/ INR at 4.4 and instructions to  hold Coumadin 5/18.  Goal of Therapy:  INR 2-3   Plan:  Will hold Coumadin and monitor INR for dose adjustments.  Vernard Gambles, PharmD, BCPS  02/17/2020,12:05 AM

## 2020-02-17 NOTE — Progress Notes (Signed)
O2 is documented as being 30L HHFNC but upon arrival to bedside, patient is actually on 35L HHFNC.  O2 sat is 96% currently.  Will decrease to 30L.

## 2020-02-17 NOTE — Progress Notes (Signed)
ANTICOAGULATION CONSULT NOTE - Follow up Pharmacy Consult for Coumadin Indication: history of atrial fibrillation and h/o PE  Allergies  Allergen Reactions  . Pseudoephedrine Other (See Comments)    REACTION: tightness in chest  . Augmentin [Amoxicillin-Pot Clavulanate] Itching and Rash    Did it involve swelling of the face/tongue/throat, SOB, or low BP? No Did it involve sudden or severe rash/hives, skin peeling, or any reaction on the inside of your mouth or nose? Yes Did you need to seek medical attention at a hospital or doctor's office? Yes When did it last happen?2019 If all above answers are "NO", may proceed with cephalosporin use.   . Latex Itching and Rash    Patient Measurements: Height: 5' 5.5" (166.4 cm) Weight: 131.7 kg (290 lb 5.5 oz) IBW/kg (Calculated) : 58.15  Vital Signs: Temp: 98.7 F (37.1 C) (05/19 1100) Temp Source: Oral (05/19 1100) BP: 120/67 (05/19 1100) Pulse Rate: 75 (05/19 1100)  Labs: Recent Labs    02-21-20 0000 02-21-2020 1725 02/21/20 2255 02/17/20 0944 02/17/20 0945  HGB  --  10.6*  --   --  9.4*  HCT  --  36.5  --   --  31.2*  PLT  --  307  --   --  283  LABPROT  --   --   --  36.9*  --   INR 4.4*  --   --  3.9*  --   CREATININE  --  1.79*  --   --  1.79*  TROPONINIHS  --  9 10  --   --     Estimated Creatinine Clearance: 43.9 mL/min (A) (by C-G formula based on SCr of 1.79 mg/dL (H)).   Medical History: Past Medical History:  Diagnosis Date  . Atrial fibrillation (HCC)    with cardioversion success  . Cellulitis 10/2019   right lower extremity  . CHF (congestive heart failure) (HCC)   . COPD (chronic obstructive pulmonary disease) (HCC)   . Cor pulmonale (HCC)    and obesity hypoventilation syndrome  . Diabetes mellitus without complication (HCC)   . GERD (gastroesophageal reflux disease)   . Hypothyroid   . Morbid obesity (HCC)   . OA (osteoarthritis)   . Pulmonary embolism (HCC)    hx of on coumadin  .  Reactive airways dysfunction syndrome [facility])     Assessment: 64yo female admitted with Covid, to continue Coumadin for Afib.   On Coumadin PTA for h/o Afib and h/o PE.  Pt had [facility] clinic visit 5/18 w/ INR at 4.4 and instructions to hold Coumadin 5/18. >> Admitted 5/18 with SOB,   Covid + INR 4.4 >down to 3.9 this AM after coumadin dose held 5/18 yesterday. INR supratherapeutic, trending down. Hgb 10.6>9.4 , pltc wnl,  d-dimer 2.37>3.74 No bleeding noted  PTA warfarin dose:  2.5mg  daily except 5 mg q Mon  (INR has been 3.1, 1.8, and 4.4 on this dose since 01/20/20)  Goal of Therapy:  INR 2-3   Plan:  Will give lower dose of 2 mg Coumadin to keep INR from falling below goal Monitor daily INR for dose adjustments.  Noah Delaine, RPh Clinical pharmacist [phone] Please check AMION for all [facility] phone numbers After 10:00 PM, call Main Pharmacy [phone] 02/17/2020,11:58 AM

## 2020-02-17 NOTE — Significant Event (Signed)
Rapid Response Event Note  Overview: Called d/t SpO2-62% on 15L HFNC and NRB. Pt had just arrived from the ED where she was only requiring NRB to maintain SpO2>92. When she arrived to 5W, she slid herself from stretch to bed. This is when her SpO2 began to drop.   Initial Focused Assessment: Pt sitting up in bed with labored breathing. Pt alert and oriented, c/o SOB. Lungs diminished t/o with crackles in bases. Skin warm and dry.  T-97.8, HR-61(SR with PACs), BP-139/81, RR-21, SpO2-72% on 15L HFNC and NRB. Pt placed on HHFNC 40L 100% and NRB with SpO2 fluctuating from 83% to 90%. After a few minutes, pt breathing not labored and pt said she felt better, SpO2-90%. Pt FiO2 weaned down to 30L 100% HHFNC without NRB.  Interventions: HHFNC Opyd to bedside: Lasix 80 mg IV SpO2 goal>88-92% Plan of Care (if not transferred): Give lasix and monitor response. Wean FiO2 as SpO2 and WOB allow, keep SpO2 88-92%. Call RRT if further assistance needed.  Event Summary:   Called: [phone] Arrived: 0049 Ended:0120 Terrilyn Saver

## 2020-02-17 NOTE — Progress Notes (Signed)
  Echocardiogram 2D Echocardiogram has been performed.  Amy Bryant 02/17/2020, 2:51 PM

## 2020-02-18 ENCOUNTER — Inpatient Hospital Stay (HOSPITAL_COMMUNITY): Payer: Medicare HMO

## 2020-02-18 DIAGNOSIS — M7989 Other specified soft tissue disorders: Secondary | ICD-10-CM

## 2020-02-18 LAB — COMPREHENSIVE METABOLIC PANEL
ALT: 9 U/L (ref 0–44)
AST: 14 U/L — ABNORMAL LOW (ref 15–41)
Albumin: 2.7 g/dL — ABNORMAL LOW (ref 3.5–5.0)
Alkaline Phosphatase: 62 U/L (ref 38–126)
Anion gap: 9 (ref 5–15)
BUN: 52 mg/dL — ABNORMAL HIGH (ref 8–23)
CO2: 27 mmol/L (ref 22–32)
Calcium: 9.6 mg/dL (ref 8.9–10.3)
Chloride: 104 mmol/L (ref 98–111)
Creatinine, Ser: 2.07 mg/dL — ABNORMAL HIGH (ref 0.44–1.00)
GFR calc Af Amer: 29 mL/min — ABNORMAL LOW (ref >60)
GFR calc non Af Amer: 25 mL/min — ABNORMAL LOW (ref >60)
Glucose, Bld: 184 mg/dL — ABNORMAL HIGH (ref 70–99)
Potassium: 4.1 mmol/L (ref 3.5–5.1)
Sodium: 140 mmol/L (ref 135–145)
Total Bilirubin: 0.3 mg/dL (ref 0.3–1.2)
Total Protein: 6.1 g/dL — ABNORMAL LOW (ref 6.5–8.1)

## 2020-02-18 LAB — PROTIME-INR
INR: 4.2 (ref 0.8–1.2)
Prothrombin Time: 39.6 seconds — ABNORMAL HIGH (ref 11.4–15.2)

## 2020-02-18 LAB — CBC WITH DIFFERENTIAL/PLATELET
Abs Immature Granulocytes: 0.08 10*3/uL — ABNORMAL HIGH (ref 0.00–0.07)
Basophils Absolute: 0 10*3/uL (ref 0.0–0.1)
Basophils Relative: 0 %
Eosinophils Absolute: 0 10*3/uL (ref 0.0–0.5)
Eosinophils Relative: 0 %
HCT: 33 % — ABNORMAL LOW (ref 36.0–46.0)
Hemoglobin: 9.9 g/dL — ABNORMAL LOW (ref 12.0–15.0)
Immature Granulocytes: 1 %
Lymphocytes Relative: 6 %
Lymphs Abs: 0.4 10*3/uL — ABNORMAL LOW (ref 0.7–4.0)
MCH: 28.8 pg (ref 26.0–34.0)
MCHC: 30 g/dL (ref 30.0–36.0)
MCV: 95.9 fL (ref 80.0–100.0)
Monocytes Absolute: 0.2 10*3/uL (ref 0.1–1.0)
Monocytes Relative: 3 %
Neutro Abs: 5.1 10*3/uL (ref 1.7–7.7)
Neutrophils Relative %: 90 %
Platelets: 303 10*3/uL (ref 150–400)
RBC: 3.44 MIL/uL — ABNORMAL LOW (ref 3.87–5.11)
RDW: 13.6 % (ref 11.5–15.5)
WBC: 5.7 10*3/uL (ref 4.0–10.5)
nRBC: 0 % (ref 0.0–0.2)

## 2020-02-18 LAB — GLUCOSE, CAPILLARY
Glucose-Capillary: 157 mg/dL — ABNORMAL HIGH (ref 70–99)
Glucose-Capillary: 148 mg/dL — ABNORMAL HIGH (ref 70–99)
Glucose-Capillary: 213 mg/dL — ABNORMAL HIGH (ref 70–99)
Glucose-Capillary: 250 mg/dL — ABNORMAL HIGH (ref 70–99)

## 2020-02-18 LAB — HEPATITIS B SURFACE ANTIGEN: Hepatitis B Surface Ag: NONREACTIVE

## 2020-02-18 LAB — D-DIMER, QUANTITATIVE: D-Dimer, Quant: 15.31 ug/mL-FEU — ABNORMAL HIGH (ref 0.00–0.50)

## 2020-02-18 LAB — C-REACTIVE PROTEIN: CRP: 3.7 mg/dL — ABNORMAL HIGH (ref <1.0)

## 2020-02-18 MED ORDER — CHLORHEXIDINE GLUCONATE CLOTH 2 % EX PADS
6.0000 | MEDICATED_PAD | Freq: Every day | CUTANEOUS | Status: DC
  Administered 2020-02-18 – 2020-03-01 (×12): 6 via TOPICAL

## 2020-02-18 NOTE — Consult Note (Signed)
NAME:  Amy Bryant, MRN:  [id number], DOB:  August 27, 1956, LOS: 2 ADMISSION DATE:  02/20/20, CONSULTATION DATE: 02/18/2020 REFERRING MD:  Jeoffrey Massed, CHIEF COMPLAINT: COVID-19 pneumonia  Brief History   64 year old with past medical history including COPD, CKD, chronic respiratory failure presenting with COVID-19 pneumonia on 5/18. Treated with steroids, remdesivir and Actemra. PCCM consulted for worsening hypoxia  Past Medical History  COPD with chronic hypoxic respiratory failure requiring 4 L [place] supplemental oxygen at all times Chronic diastolic congestive heart failure PAF on Coumadin Prior history of PE CKD stage IIIb Noninsulin-dependent type 2 diabetes Morbid obesity Reactive airway dysfunction  Osteoarthritis  Hypothyroidism GERD  Significant Hospital Events   5/19 Adit 5/20 Transfer to ICU  Consults:  PCCM  Procedures:    Significant Diagnostic Tests:  Chest x-ray 5/20-cardiomegaly, patchy airspace disease, vascular congestion  Echo 5/19-LVEF 60 to 65%, moderate LA, RA dilation  Micro Data:    Antimicrobials/COVID Rx  Solumedrol5/18>> Remdesivir:5/18>> Actemra: 5/18 x 1  Interim history/subjective:    Objective   Blood pressure 127/70, pulse 60, temperature 98 F (36.7 C), temperature source Oral, resp. rate 19, height 5' 5.5" (1.664 m), weight 131 kg, SpO2 95 %.    FiO2 (%):  [100 %] 100 %   Intake/Output Summary (Last 24 hours) at 02/18/2020 1347 Last data filed at 02/18/2020 3235 Gross per 24 hour  Intake 100 ml  Output 1450 ml  Net -1350 ml   Filed Weights   02/17/20 0120 02/17/20 0800 02/18/20 0349  Weight: 131.7 kg 131.7 kg 131 kg    Examination: Gen:      Anxious, tearful HEENT:  EOMI, sclera anicteric Neck:     No masses; no thyromegaly Lungs:    Scattered rhonchi; normal respiratory effort CV:         Regular rate and rhythm; no murmurs Abd:      + bowel sounds; soft, non-tender; no palpable masses, no distension Ext:     No edema; adequate peripheral perfusion Skin:      Warm and dry; no rash Neuro: Awake, oriented  Resolved Hospital Problem list     Assessment & Plan:  Severe ARDS secondary to COVID-19 pneumonia Baseline COPD on 4 L oxygen. Continue supplemental oxygen Transferring to ICU for monitoring. Follow chest x-ray, ABG, incentive spirometer Patient is unable to prone due to discomfort  CHF Paroxysmal atrial fibrillation on Coumadin anticoagulation as outpatient Holding diuresis today due to bump in creatinine Follow I/O Continue Cardizem Coumadin on hold due to elevated INR  Chronic kidney disease, AKI Follow urine output and creatinine  Diabetes Continue sliding-scale insulin  Hypothyroidism On Synthroid  Best practice:  Diet: PO diet Pain/Anxiety/Delirium protocol (if indicated): NA VAP protocol (if indicated): NA DVT prophylaxis: Coumadin on hold GI prophylaxis: PPI Glucose control: SSI Mobility: Bed Code Status: Full Family Communication: Per primary Disposition: ICU  Labs   CBC: Recent Labs  Lab 2020-02-20 1725 02/17/20 0945 02/18/20 0544  WBC 7.1 2.7* 5.7  NEUTROABS 6.2 2.2 5.1  HGB 10.6* 9.4* 9.9*  HCT 36.5 31.2* 33.0*  MCV 99.2 95.7 95.9  PLT 307 283 303    Basic Metabolic Panel: Recent Labs  Lab 2020-02-20 1725 02/17/20 0945 02/18/20 0544  NA 141 139 140  K 4.1 4.2 4.1  CL 105 103 104  CO2 22 24 27   GLUCOSE 125* 157* 184*  BUN 37* 39* 52*  CREATININE 1.79* 1.79* 2.07*  CALCIUM 9.8 9.5 9.6   GFR: Estimated Creatinine Clearance: 37.8  mL/min (A) (by C-G formula based on SCr of 2.07 mg/dL (H)). Recent Labs  Lab 01/30/2020 1725 02/06/2020 1728 02/23/2020 2255 02/17/20 0945 02/18/20 0544  PROCALCITON <0.10  --   --   --   --   WBC 7.1  --   --  2.7* 5.7  LATICACIDVEN  --  1.6 1.3  --   --     Liver Function Tests: Recent Labs  Lab 01/30/2020 1725 02/17/20 0945 02/18/20 0544  AST 19 13* 14*  ALT 13 11 9   ALKPHOS 68 66 62  BILITOT 0.6 0.4  0.3  PROT 7.3 6.7 6.1*  ALBUMIN 3.1* 2.8* 2.7*   No results for input(s): LIPASE, AMYLASE in the last 168 hours. No results for input(s): AMMONIA in the last 168 hours.  ABG    Component Value Date/Time   PHART 7.360 04/02/2019 1702   PCO2ART 45.1 04/02/2019 1702   PO2ART 122 (H) 04/02/2019 1702   HCO3 24.8 04/02/2019 1702   TCO2 29.1 10/01/2009 0925   O2SAT 98.7 04/02/2019 1702     Coagulation Profile: Recent Labs  Lab 02/03/2020 0000 02/17/20 0944 02/18/20 0544  INR 4.4* 3.9* 4.2*    Cardiac Enzymes: No results for input(s): CKTOTAL, CKMB, CKMBINDEX, TROPONINI in the last 168 hours.  HbA1C: Hgb A1c MFr Bld  Date/Time Value Ref Range Status  02/17/2020 09:44 AM 5.8 (H) 4.8 - 5.6 % Final    Comment:    (NOTE)         Prediabetes: 5.7 - 6.4         Diabetes: >6.4         Glycemic control for adults with diabetes: <7.0   10/07/2019 03:44 AM 5.2 4.8 - 5.6 % Final    Comment:    (NOTE) Pre diabetes:          5.7%-6.4% Diabetes:              >6.4% Glycemic control for   <7.0% adults with diabetes     CBG: Recent Labs  Lab 02/17/20 1123 02/17/20 1545 02/17/20 2127 02/18/20 0734 02/18/20 1122  GLUCAP 127* 158* 200* 157* 148*    Review of Systems:   REVIEW OF SYSTEMS:   All negative; except for those that are bolded, which indicate positives.  Constitutional: weight loss, weight gain, night sweats, fevers, chills, fatigue, weakness.  HEENT: headaches, sore throat, sneezing, nasal congestion, post nasal drip, difficulty swallowing, tooth/dental problems, visual complaints, visual changes, ear aches. Neuro: difficulty with speech, weakness, numbness, ataxia. CV:  chest pain, orthopnea, PND, swelling in lower extremities, dizziness, palpitations, syncope.  Resp: cough, hemoptysis, dyspnea, wheezing. GI: heartburn, indigestion, abdominal pain, nausea, vomiting, diarrhea, constipation, change in bowel habits, loss of appetite, hematemesis, melena, hematochezia.    GU: dysuria, change in color of urine, urgency or frequency, flank pain, hematuria. MSK: joint pain or swelling, decreased range of motion. Psych: change in mood or affect, depression, anxiety, suicidal ideations, homicidal ideations. Skin: rash, itching, bruising.  Past Medical History  She,  has a past medical history of Atrial fibrillation (HCC), Cellulitis (10/2019), CHF (congestive heart failure) (HCC), COPD (chronic obstructive pulmonary disease) (HCC), Cor pulmonale (HCC), Diabetes mellitus without complication (HCC), GERD (gastroesophageal reflux disease), Hypothyroid, Morbid obesity (HCC), OA (osteoarthritis), Pulmonary embolism (HCC), and Reactive airways dysfunction syndrome (HCC).   Surgical History    Past Surgical History:  Procedure Laterality Date  . BIOPSY  01/12/2019   Procedure: BIOPSY;  Surgeon: 01/14/2019, MD;  Location: [facility] ENDOSCOPY;  Service: Gastroenterology;;  . COLONOSCOPY WITH PROPOFOL N/A 01/12/2019   Procedure: COLONOSCOPY WITH PROPOFOL;  Surgeon: Jerene Bears, MD;  Location: [place];  Service: Gastroenterology;  Laterality: N/A;  . ESOPHAGOGASTRODUODENOSCOPY (EGD) WITH PROPOFOL N/A 01/12/2019   Procedure: ESOPHAGOGASTRODUODENOSCOPY (EGD) WITH PROPOFOL;  Surgeon: Jerene Bears, MD;  Location: [facility] ENDOSCOPY;  Service: Gastroenterology;  Laterality: N/A;  . TONSILLECTOMY       Social History   reports that she has quit smoking. She has quit using smokeless tobacco. She reports that she does not drink alcohol or use drugs.   Family History   Her family history includes Diabetes in her brother; Heart disease (age of onset: 36) in her father.   Allergies Allergies  Allergen Reactions  . Pseudoephedrine Other (See Comments)    REACTION: tightness in chest  . Augmentin [Amoxicillin-Pot Clavulanate] Itching and Rash    Did it involve swelling of the face/tongue/throat, SOB, or low BP? No Did it involve sudden or severe rash/hives, skin peeling, or any  reaction on the inside of your mouth or nose? Yes Did you need to seek medical attention at a hospital or doctor's office? Yes When did it last happen?2019 If all above answers are "NO", may proceed with cephalosporin use.   . Latex Itching and Rash     Home Medications  Prior to Admission medications   Medication Sig Start Date End Date Taking? Authorizing Provider  albuterol (PROVENTIL) (2.5 MG/3ML) 0.083% nebulizer solution Take 3 mLs (2.5 mg total) by nebulization every 4 (four) hours as needed for wheezing or shortness of breath. 02/11/2020  Yes [name], [name], MD  ALPRAZolam Duanne Moron) 0.5 MG tablet Take 1 tablet (0.5 mg total) by mouth 2 (two) times daily as needed for anxiety. 10/19/19  Yes [name], [name], MD  budesonide-formoterol (SYMBICORT) 160-4.5 MCG/ACT inhaler INHALE 2 PUFFS INTO LUNGS TWICE A DAY Patient taking differently: Inhale 2 puffs into the lungs 2 (two) times daily.  01/06/20  Yes [name], [name], MD  Cholecalciferol (VITAMIN D-3 PO) Take 1 tablet by mouth daily.   Yes [provider]  ciclopirox (LOPROX) 0.77 % cream APPLY TO AFFECTED AREA TWICE A DAY AS NEEDED Patient taking differently: Apply 1 application topically 2 (two) times daily.  11/17/19  Yes [name], [name], MD  diltiazem (CARTIA XT) 120 MG 24 hr capsule Take 1 capsule (120 mg total) by mouth daily. 02/10/20  Yes [name], [name], MD  FERREX 150 150 MG capsule TAKE 1 CAPSULE BY MOUTH TWICE A DAY Patient taking differently: Take 300 mg by mouth daily.  11/18/19  Yes [name], [name], MD  FLUoxetine (PROZAC) 40 MG capsule Take 1 capsule (40 mg total) by mouth daily. 07/31/19  Yes [name], [name], MD  furosemide (LASIX) 80 MG tablet Take 1 tablet (80 mg total) by mouth 2 (two) times daily. 02/23/2020  Yes [name], [name], MD  glipiZIDE (GLUCOTROL XL) 5 MG 24 hr tablet Take 1 tablet (5 mg total) by mouth daily with breakfast. 01/06/20  Yes [name], Marne A, MD  glucose blood test strip Test blood sugar once daily and as  needed for DM 250.0 11/19/14  Yes [name], [name], MD  Lancets (ONETOUCH ULTRASOFT) lancets Test blood sugar once daily and as needed for DM 250.0    Yes [provider]  levothyroxine (SYNTHROID) 175 MCG tablet TAKE 1 TABLET BY MOUTH DAILY BEFORE BREAKFAST. Patient taking differently: Take 175 mcg by mouth daily before breakfast.  07/24/19  Yes [name], Marne A, MD  mometasone (ELOCON) 0.1 % cream Apply 1 application topically See admin instructions. Tiny amount to each ear canal twice weekly as needed.   Yes [provider]  mupirocin ointment (BACTROBAN) 2 % APPLY TO AFFECTED AREA TWICE A DAY Patient taking differently: Apply 1 application topically as needed (skin).  01/01/19  Yes [name], [name], MD  nystatin (MYCOSTATIN) 100000 UNIT/ML suspension Take 5 mLs (500,000 Units total) by mouth 3 (three) times daily. Swish and swallow 10/19/19  Yes [name], [name], MD  pantoprazole (PROTONIX) 40 MG tablet Take 1 tablet (40 mg total) by mouth daily at 6 (six) AM. 02/21/2020  Yes [name], [name], MD  potassium chloride SA (KLOR-CON) 20 MEQ tablet TAKE 2 TABLETS BY MOUTH EVERY DAY Patient taking differently: Take 40 mEq by mouth daily.  01/14/20  Yes [name], [name], MD  simvastatin (ZOCOR) 20 MG tablet TAKE 1 TABLET BY MOUTH EVERYDAY AT BEDTIME Patient taking differently: Take 20 mg by mouth at bedtime.  02/26/2020  Yes [name], Marne A, MD  traMADol (ULTRAM) 50 MG tablet 1TAB BY MOUTH EVERY 12HRS AS NEEDED FOR MODERATE/SEVERE PAIN. CAUTION OF SEDATION/FALLS/CONSTIPATION Patient taking differently: Take 50 mg by mouth every 12 (twelve) hours as needed for moderate pain or severe pain. CAUTION OF SEDATION/FALLS/CONSTIPATION 01/06/20  Yes [name], [name], MD  warfarin (COUMADIN) 5 MG tablet Take 0.5-1 tablets (2.5-5 mg total) by mouth See admin instructions. MWF take 5mg . TTSS take 2.5mg  Patient taking differently: Take 2.5-5 mg by mouth See admin instructions. Take 5mg  on Mondays and Fridays, and 2.5mg  on all  other days. Take in the evenings. 11/26/19  Yes [name], 02-18-1990, MD  acidophilus (RISAQUAD) CAPS capsule Take 1 capsule by mouth daily. Patient not taking: Reported on 02/17/2020 10/10/19   Pokhrel, 02/19/2020, MD  Hydrocortisone (GERHARDT'S BUTT CREAM) CREA Apply 1 application topically 3 (three) times daily as needed for irritation. Patient not taking: Reported on 02/17/2020 04/11/19   02/19/2020, MD  senna-docusate (SENOKOT-S) 8.6-50 MG tablet Take 1 tablet by mouth 2 (two) times daily. Patient not taking: Reported on 02/17/2020 04/11/19   02/19/2020, MD     Critical care time:    The patient is critically ill with multiple organ system failure and requires high complexity decision making for assessment and support, frequent evaluation and titration of therapies, advanced monitoring, review of radiographic studies and interpretation of complex data.   Critical Care Time devoted to patient care services, exclusive of separately billable procedures, described in this note is 35 minutes.   06/12/19 MD [facility] Please see [email or web address] for pager details.  02/18/2020, 2:00 PM

## 2020-02-18 NOTE — Evaluation (Signed)
Physical Therapy Evaluation Patient Details Name: Amy Bryant MRN: [id number] DOB: June 25, 1956 Today's Date: 02/18/2020   History of Present Illness  64 y.o. female admitted on 02/29/2020 for SOB.  Dx with COVID 19 PNA and decompensated diastolic heart failure. Pt with significant PMH of DM2, depression/anxiety, PE, PAF, CKD III, COPD (on 4 L O2 [place] at baseline), morbid obestiy, CHF, cellulitis.   Clinical Impression  Pt is anxious, fearful of dying.  She is on 30 L HFNC and 15L NRB mask.  She desaturates to the low 80s when she talks, but is alert, and in no apparent distress at rest.  Bed level strength assessed and pt did not feel up to try EOB today as she is still quite fearful.  She said she would try tomorrow with me.  I have asked the chaplin to come by to visit with her (per her request) for regular visits and prayer.  Coloring book and word puzzle provided for mental distraction.   PT to follow acutely for deficits listed below.      Follow Up Recommendations Home health PT(would like to use [facility])    Equipment Recommendations  Other (comment)(tub transfer bench)    Recommendations for Other Services   NA    Precautions / Restrictions Precautions Precautions: Fall;Other (comment) Precaution Comments: monitor sats closely Restrictions Weight Bearing Restrictions: No      Mobility  Bed Mobility                  Transfers                 General transfer comment: Pt fearful to move today, desaturates to the low 80s with just talking, agreeable to attempt EOB tomorrow.                Pertinent Vitals/Pain Pain Assessment: Faces Faces Pain Scale: Hurts little more Pain Location: chronic left leg arthritic pain Pain Descriptors / Indicators: Grimacing;Guarding Pain Intervention(s): Limited activity within patient's tolerance;Monitored during session;Repositioned    Home Living Family/patient expects to be discharged to:: Private  residence Living Arrangements: Non-relatives/Friends Available Help at Discharge: Available 24 hours/day;Friend(s) Type of Home: House Home Access: Ramped entrance(except one STE after ramp)     Home Layout: One level Home Equipment: Walker - 2 wheels;Wheelchair - manual      Prior Function Level of Independence: Needs assistance   Gait / Transfers Assistance Needed: Walks in house with RW  ADL's / Homemaking Assistance Needed: sits in [facility] to prepare meals as she cannot stand very long  Comments: on 4L of [employer] PTA, sleeps in recliner; has HH RN 1 x week through [facility].  Has had therapy in the past, but is not currently active with them.      Hand Dominance   Dominant Hand: Right    Extremity/Trunk Assessment   Upper Extremity Assessment Upper Extremity Assessment: Defer to OT evaluation(left shoulder dysfunction)    Lower Extremity Assessment Lower Extremity Assessment: LLE deficits/detail LLE Deficits / Details: left leg is significantly weaker than right leg per in bed assessment 3-/5 at hip and 3+/5 at knee    Cervical / Trunk Assessment Cervical / Trunk Assessment: Normal  Communication   Communication: No difficulties  Cognition Arousal/Alertness: Awake/alert Behavior During Therapy: Anxious(fearful of death) Overall Cognitive Status: Within Functional Limits for tasks assessed  General Comments General comments (skin integrity, edema, etc.): Pt on 30 L HHF and 15L NRB mask        Assessment/Plan    PT Assessment Patient needs continued PT services  PT Problem List Decreased strength;Decreased activity tolerance;Decreased balance;Decreased mobility;Decreased knowledge of use of DME;Decreased knowledge of precautions;Cardiopulmonary status limiting activity;Obesity       PT Treatment Interventions DME instruction;Gait training;Functional mobility training;Stair training;Therapeutic  activities;Therapeutic exercise;Balance training;Patient/family education;Wheelchair mobility training    PT Goals (Current goals can be found in the Care Plan section)  Acute Rehab PT Goals Patient Stated Goal: to go home and see her grandchildren again PT Goal Formulation: With patient Time For Goal Achievement: 03/03/20 Potential to Achieve Goals: Good    Frequency Min 3X/week           AM-PAC PT "6 Clicks" Mobility  Outcome Measure Help needed turning from your back to your side while in a flat bed without using bedrails?: A Little Help needed moving from lying on your back to sitting on the side of a flat bed without using bedrails?: A Little Help needed moving to and from a bed to a chair (including a wheelchair)?: A Little Help needed standing up from a chair using your arms (e.g., wheelchair or bedside chair)?: A Little Help needed to walk in hospital room?: A Little Help needed climbing 3-5 steps with a railing? : Total 6 Click Score: 16    End of Session   Activity Tolerance: Other (comment)(limited by anxiety) Patient left: in bed Nurse Communication: Mobility status PT Visit Diagnosis: Difficulty in walking, not elsewhere classified (R26.2);History of falling (Z91.81);Muscle weakness (generalized) (M62.81)    Time: 5681-2751 PT Time Calculation (min) (ACUTE ONLY): 32 min   Charges:         Verdene Lennert, PT, DPT  Acute Rehabilitation [phone] pager [phone] office     PT Evaluation $PT Eval Moderate Complexity: 1 Mod(did not mobilize, so only charged eval)          02/18/2020, 12:04 PM

## 2020-02-18 NOTE — Progress Notes (Addendum)
PROGRESS NOTE                                                                                                                                                                                                             Patient Demographics:    Amy Bryant, is a 64 y.o. female, DOB - 1956/03/24, TJQ:[id number]  Outpatient Primary MD for the patient is [name], [name], MD   Admit date - Feb 25, 2020   LOS - 2  No chief complaint on file.      Brief Narrative: Patient is a 64 y.o. female with PMHx of COPD with chronic hypoxic respiratory failure on 4 L of oxygen at home, chronic diastolic heart failure, PAF on Coumadin, history of PE, CKD stage IIIb, non-insulin-dependent DM-2, morbid obesity-who presented to the hospital with approximately 2-3-day history of worsening shortness of breath.  For approximately 1 week-she has been having nasal congestion/URI symptoms-her PCP called in prednisone and Zithromax which she took without any relief.  In the emergency room-she was noted to have severe hypoxemia-requiring NRB-she was subsequently admitted to the hospitalist service.  Note-has not received the Covid vaccine.  Significant Events: 5/18>> admit to [facility] for severe hypoxemia requiring nonrebreather mask. 5/19>> on heated high flow 5/20>> on heated high flow+ NRB-transfer to MICU  COVID-19 medications: Steroids: 5/18>> Remdesivir: 5/18>> Actemra: 5/18 x 1  Antibiotics: None  Microbiology data: 5/18: Blood culture>> no growth  Significant studies: 5/19: Echo>> EF 60-65% 5/19: Chest x-ray>> interstitial pulmonary edema/patchy airspace opacity in the left base 5/18: Chest x-ray>> new diffuse interstitial thickening/reticular opacities suspicious for pulmonary edema  DVT prophylaxis: Coumadin-supratherapeutic INR.  Procedures: None  Consults: PCCM    Subjective:   Appears very comfortable-not in distress-but  hypoxia seems to have worsened-on heated high flow and NRB this morning.   Assessment  & Plan :   Acute on chronic hypoxic Resp Failure due to Covid 19 Viral pneumonia and decompensated diastolic heart failure: Remains severely hypoxemic-hypoxia seems to have worsened overnight-remains on heated high flow but requires a NRB to maintain saturations in the high 80s.  Continue steroids/remdesivir-due to bump in creatinine-hold diuretics today.  Given worsening hypoxemia-we will go ahead and transfer to the ICU for closer monitoring-as she is at high risk for intubation-PCCM consulted.    Fever: afebrile Prone/Incentive Spirometry: She is unable to prone  or lie on her side-have encouraged incentive spirometry/flutter use 5 times an hour  O2 requirements:  SpO2: 95 % O2 Flow Rate (L/min): 30 L/min FiO2 (%): 100 %   COVID-19 Labs: Recent Labs    02/11/2020 1725 02/17/20 0945 02/18/20 0544  DDIMER 2.37* 3.74* 15.31*  FERRITIN 74  --   --   LDH 259*  --   --   CRP 6.5* 6.7* 3.7*       Component Value Date/Time   BNP 233.8 (H) 02/17/2020 0945    Recent Labs  Lab 02/13/2020 1725  PROCALCITON <0.10    Lab Results  Component Value Date   SARSCOV2NAA POSITIVE (A) 02/09/2020   SARSCOV2NAA NEGATIVE 10/06/2019   SARSCOV2NAA NOT DETECTED 04/08/2019   SARSCOV2NAA NOT DETECTED 04/02/2019    Decompensated diastolic heart failure: Volume status has improved-due to bump in creatinine-hold Lasix.  Continue to follow volume status/electrolytes/intake and output.   Elevated D-dimer: Significant bump in D-dimer today-INR remains supratherapeutic- check a lower extremity Doppler.  COPD: Not in exacerbation this morning-no wheezing-decrease steroids to twice daily dosing.  Continue bronchodilators.  Normally on 4 L of oxygen at all times at home.  AKI on CKD stage IIIb: Slight bump in creatinine today-likely secondary to Lasix use-hold Lasix--follow closely.    PAF: Sinus rhythm overnight-resume  Cardizem-INR supratherapeutic-Coumadin on hold-pharmacy following.  Remote history of pulmonary embolism: INR supratherapeutic-Coumadin on hold-pharmacy following.  Hypothyroidism: Continue Synthroid  Depression/anxiety: Slightly anxious due to acute illness/hypoxia/oxygen requirement-continue as needed Xanax and Prozac.  DM-2 (A1c 5.2 on 10/07/2019): CBGs stable on SSI-follow and adjust.  Recent Labs    02/17/20 2127 02/18/20 0734 02/18/20 1122  GLUCAP 200* 157* 148*   Morbid Obesity: Estimated body mass index is 47.33 kg/m as calculated from the following:   Height as of this encounter: 5' 5.5" (1.664 m).   Weight as of this encounter: 131 kg.    GI prophylaxis: PPI  ABG:    Component Value Date/Time   PHART 7.360 04/02/2019 1702   PCO2ART 45.1 04/02/2019 1702   PO2ART 122 (H) 04/02/2019 1702   HCO3 24.8 04/02/2019 1702   TCO2 29.1 10/01/2009 0925   O2SAT 98.7 04/02/2019 1702    Vent Settings: N/A  Condition - Extremely Guarded-very tenuous with risk for further deterioration  Family Communication  : Unable to reach son again on 5/20 ([phone])  Code Status :  Full Code  Diet :  Diet Order            Diet heart healthy/carb modified Room service appropriate? Yes; Fluid consistency: Thin; Fluid restriction: 1200 mL Fluid  Diet effective now               Disposition Plan  :   Status is: Inpatient  Remains inpatient appropriate because:Inpatient level of care appropriate due to severity of illness   Dispo: The patient is from: Home              Anticipated d/c is to: SNF              Anticipated d/c date is: > 3 days              Patient currently is not medically stable to d/c.  Barriers to discharge: Severe Hypoxia requiring O2 supplementation/complete 5 days of IV Remdesivir  Antimicorbials  :    Anti-infectives (From admission, onward)   Start     Dose/Rate Route Frequency Ordered Stop   02/17/20 1000  remdesivir 100 mg in  sodium chloride  0.9 % 100 mL IVPB     100 mg 200 mL/hr over 30 Minutes Intravenous Daily 2020-02-17 2202 02/21/20 0959   02/17/20 2215  remdesivir 200 mg in sodium chloride 0.9% 250 mL IVPB     200 mg 580 mL/hr over 30 Minutes Intravenous Once Feb 17, 2020 2202 2020-02-17 2319      Inpatient Medications  Scheduled Meds: . vitamin C  500 mg Oral Daily  . budesonide  2 puff Inhalation BID  . cholecalciferol  1,000 Units Oral Daily  . diltiazem  120 mg Oral Daily  . FLUoxetine  40 mg Oral Daily  . fluticasone  1 spray Each Nare Daily  . insulin aspart  0-5 Units Subcutaneous QHS  . insulin aspart  0-9 Units Subcutaneous TID WC  . Ipratropium-Albuterol  2 puff Inhalation Q6H  . levothyroxine  175 mcg Oral Q0600  . methylPREDNISolone (SOLU-MEDROL) injection  60 mg Intravenous Q12H  . pantoprazole  40 mg Oral Q1200  . sodium chloride flush  3 mL Intravenous Q12H  . Warfarin - Pharmacist Dosing Inpatient   Does not apply q1600  . zinc sulfate  220 mg Oral Daily   Continuous Infusions: . sodium chloride    . remdesivir 100 mg in NS 100 mL 100 mg (02/18/20 0834)   PRN Meds:.sodium chloride, acetaminophen, albuterol, ALPRAZolam, chlorpheniramine-HYDROcodone, guaiFENesin-dextromethorphan, oxymetazoline, sodium chloride, sodium chloride flush, traMADol   Time Spent in minutes  45   The patient is critically ill with multiple organ system failure and requires high complexity decision making for assessment and support, frequent evaluation and titration of therapies, advanced monitoring, review of radiographic studies and interpretation of complex data.   See all Orders from today for further details   Jeoffrey Massed M.D on 02/18/2020 at 1:08 PM  To page go to [email or web address] - use universal password  [facility] -  Office  [phone]    Objective:   Vitals:   02/18/20 0418 02/18/20 0424 02/18/20 0700 02/18/20 1100  BP:  113/60 (!) 108/57 127/70  Pulse: 72 63 70 60  Resp: Temp:    98.6 F (37 C) 98 F (36.7 C)  TempSrc:   Oral Oral  SpO2: (!) 89% (!) 89% 90% 95%  Weight:      Height:        Wt Readings from Last 3 Encounters:  02/18/20 131 kg  01/28/20 132 kg  01/06/20 132 kg     Intake/Output Summary (Last 24 hours) at 02/18/2020 1308 Last data filed at 02/18/2020 0834 Gross per 24 hour  Intake 100 ml  Output 1450 ml  Net -1350 ml     Physical Exam Gen Exam:Alert awake-not in any distress HEENT:atraumatic, normocephalic Chest: B/L clear to auscultation anteriorly CVS:S1S2 regular Abdomen:soft non tender, non distended Extremities:trace edema Neurology: Non focal Skin: no rash   Data Review:    CBC Recent Labs  Lab 2020/02/17 1725 02/17/20 0945 02/18/20 0544  WBC 7.1 2.7* 5.7  HGB 10.6* 9.4* 9.9*  HCT 36.5 31.2* 33.0*  PLT 307 283 303  MCV 99.2 95.7 95.9  MCH 28.8 28.8 28.8  MCHC 29.0* 30.1 30.0  RDW 13.7 13.6 13.6  LYMPHSABS 0.5* 0.3* 0.4*  MONOABS 0.3 0.1 0.2  EOSABS 0.0 0.0 0.0  BASOSABS 0.0 0.0 0.0    Chemistries  Recent Labs  Lab 02-17-2020 1725 02/17/20 0945 02/18/20 0544  NA 141 139 140  K 4.1 4.2 4.1  CL 105 103 104  CO2  22 24 27   GLUCOSE 125* 157* 184*  BUN 37* 39* 52*  CREATININE 1.79* 1.79* 2.07*  CALCIUM 9.8 9.5 9.6  AST 19 13* 14*  ALT 13 11 9   ALKPHOS 68 66 62  BILITOT 0.6 0.4 0.3   ------------------------------------------------------------------------------------------------------------------ Recent Labs    02/13/2020 1725  TRIG 143    Lab Results  Component Value Date   HGBA1C 5.8 (H) 02/17/2020   ------------------------------------------------------------------------------------------------------------------ No results for input(s): TSH, T4TOTAL, T3FREE, THYROIDAB in the last 72 hours.  Invalid input(s): FREET3 ------------------------------------------------------------------------------------------------------------------ Recent Labs    02/07/2020 1725  FERRITIN 74    Coagulation  profile Recent Labs  Lab 02/08/2020 0000 02/17/20 0944 02/18/20 0544  INR 4.4* 3.9* 4.2*    Recent Labs    02/17/20 0945 02/18/20 0544  DDIMER 3.74* 15.31*    Cardiac Enzymes No results for input(s): CKMB, TROPONINI, MYOGLOBIN in the last 168 hours.  Invalid input(s): CK ------------------------------------------------------------------------------------------------------------------    Component Value Date/Time   BNP 233.8 (H) 02/17/2020 0945    Micro Results Recent Results (from the past 240 hour(s))  SARS CORONAVIRUS 2 (TAT 6-24 HRS) Nasopharyngeal Nasopharyngeal Swab     Status: Abnormal   Collection Time: 02/01/2020  5:25 PM   Specimen: Nasopharyngeal Swab  Result Value Ref Range Status   SARS Coronavirus 2 POSITIVE (A) NEGATIVE Final    Comment: RESULT CALLED TO, READ BACK BY AND VERIFIED WITH: RN S GRINDSTAFF @0028  02/17/20 BY S GEZAHEGN (NOTE) SARS-CoV-2 target nucleic acids are DETECTED. The SARS-CoV-2 RNA is generally detectable in upper and lower respiratory specimens during the acute phase of infection. Positive results are indicative of the presence of SARS-CoV-2 RNA. Clinical correlation with patient history and other diagnostic information is  necessary to determine patient infection status. Positive results do not rule out bacterial infection or co-infection with other viruses.  The expected result is Negative. Fact Sheet for Patients: 02/18/20 Fact Sheet for Healthcare Providers: This test is not yet approved or cleared by the 02/19/20 FDA and  has been authorized for detection and/or diagnosis of SARS-CoV-2 by FDA under an Emergency Use Authorization (EUA). This EUA will remain  in effect (meaning this test can be used)  for the duration of the COVID-19 declaration under Section 564(b)(1) of the Act, 21 U.S.C. section 360bbb-3(b)(1), unless the authorization is terminated  or revoked sooner. Performed at [facility], [address] [place], [facility] [address]   Blood Culture (routine x 2)     Status: None (Preliminary result)   Collection Time: 01/30/2020  5:28 PM   Specimen: BLOOD  Result Value Ref Range Status   Specimen Description BLOOD RIGHT ANTECUBITAL  Final   Special Requests   Final    BOTTLES DRAWN AEROBIC AND ANAEROBIC Blood Culture adequate volume   Culture   Final    NO GROWTH 2 DAYS Performed at [facility], [address], [place], [facility] [address]    Report Status PENDING  Incomplete  Blood Culture (routine x 2)     Status: None (Preliminary result)   Collection Time: 02/24/2020  5:47 PM   Specimen: BLOOD LEFT FOREARM  Result Value Ref Range Status   Specimen Description BLOOD LEFT FOREARM  Final   Special Requests   Final    BOTTLES DRAWN AEROBIC AND ANAEROBIC Blood Culture adequate volume   Culture   Final    NO GROWTH 2 DAYS Performed at [facility], [address], [place], [facility]  [id number]    Report Status PENDING  Incomplete    Radiology Reports DG Chest Port 1 View  Result Date: 02/12/2020 CLINICAL DATA:  Dyspnea, fever. EXAM: PORTABLE CHEST 1 VIEW COMPARISON:  Radiograph 10/06/2019 FINDINGS: Significant interval change from prior exam with diffuse interstitial thickening and reticular opacities. There is also perivascular haziness. Cardiomegaly with unchanged mediastinal contours. Aortic atherosclerosis. Possible small pleural effusions. No visualized pneumothorax. IMPRESSION: New diffuse interstitial thickening and reticular opacities suspicious for pulmonary edema. Similar cardiomegaly. Possible small pleural effusions. Findings suspicious for CHF. Electronically Signed   By: Keith Rake M.D.   On: 01/30/2020 18:11   DG Chest Port 1V same Day  Result Date: 02/17/2020 CLINICAL DATA:  Shortness of breath EXAM: PORTABLE CHEST 1 VIEW COMPARISON:  Feb 16, 2020 FINDINGS: There is cardiomegaly with  pulmonary venous hypertension. There is interstitial edema with patchy airspace opacity in the left base. No adenopathy. There is aortic atherosclerosis. No bone lesions. IMPRESSION: Cardiomegaly with pulmonary vascular congestion. There is interstitial pulmonary edema. Suspect a degree of congestive heart failure. Patchy airspace opacity in the left base is concerning for superimposed pneumonia, although alveolar edema in this area could present in this manner. Both pneumonia and alveolar edema could present concurrently. In comparison with 1 day prior, there appears to be somewhat less interstitial edema. Aortic Atherosclerosis (ICD10-I70.0). Electronically Signed   By: Lowella Grip III M.D.   On: 02/17/2020 08:01   ECHOCARDIOGRAM COMPLETE  Result Date: 02/17/2020    ECHOCARDIOGRAM REPORT   Patient Name:   LAMAE FOSCO Date of Exam: 02/17/2020 Medical Rec #:  [id number]      Height:       65.5 in Accession #:    [id number]     Weight:       290.3 lb Date of Birth:  1956/08/01      BSA:          2.331 m Patient Age:    32 years       BP:           120/67 mmHg Patient Gender: F              HR:           75 bpm. Exam Location:  Inpatient Procedure: 2D Echo Indications:    CHF 428  History:        Patient has prior history of Echocardiogram examinations, most                 recent 01/09/2019. CHF, COPD, Arrythmias:Atrial Fibrillation;                 Risk Factors:Diabetes and Former Smoker. Covid +. cor pulmonale.                 pulmonary embolism.  Sonographer:    Jannett Celestine RDCS (AE) Referring Phys: [id number] Shela Leff  Sonographer Comments: Patient is morbidly obese. Image acquisition challenging due to patient body habitus and Image acquisition challenging due to respiratory motion. limited mobility IMPRESSIONS  1. Left ventricular ejection fraction, by estimation, is 60 to 65%. The left ventricle has normal function. The left ventricle has no regional wall motion abnormalities. Left  ventricular diastolic parameters are indeterminate.  2. Right ventricular systolic function was not well visualized. The right ventricular size is not well visualized.  3. Left atrial size was moderately dilated.  4. Right atrial size was mild to moderately dilated.  5. The mitral valve is normal in structure.  No evidence of mitral valve regurgitation. No evidence of mitral stenosis.  6. The aortic valve was not well visualized. Aortic valve regurgitation is trivial. Mild to moderate aortic valve sclerosis/calcification is present, without any evidence of aortic stenosis.  7. The inferior vena cava not well seen. FINDINGS  Left Ventricle: Left ventricular ejection fraction, by estimation, is 60 to 65%. The left ventricle has normal function. The left ventricle has no regional wall motion abnormalities. The left ventricular internal cavity size was normal in size. There is  no left ventricular hypertrophy. Left ventricular diastolic parameters are indeterminate. Right Ventricle: The right ventricular size is not well visualized. Right vetricular wall thickness was not assessed. Right ventricular systolic function was not well visualized. Left Atrium: Left atrial size was moderately dilated. Right Atrium: Right atrial size was mild to moderately dilated. Pericardium: There is no evidence of pericardial effusion. Mitral Valve: The mitral valve is normal in structure. There is mild thickening of the mitral valve leaflet(s). There is mild calcification of the mitral valve leaflet(s). Normal mobility of the mitral valve leaflets. Moderate mitral annular calcification. No evidence of mitral valve regurgitation. No evidence of mitral valve stenosis. Tricuspid Valve: The tricuspid valve is normal in structure. Tricuspid valve regurgitation is mild . No evidence of tricuspid stenosis. Aortic Valve: The aortic valve was not well visualized. Aortic valve regurgitation is trivial. Mild to moderate aortic valve  sclerosis/calcification is present, without any evidence of aortic stenosis. Pulmonic Valve: The pulmonic valve was normal in structure. Pulmonic valve regurgitation is not visualized. No evidence of pulmonic stenosis. Aorta: The aortic root is normal in size and structure. Venous: The inferior vena cava not well seen. IAS/Shunts: The interatrial septum was not well visualized.  LEFT VENTRICLE PLAX 2D LVIDd:         5.30 cm LVIDs:         3.40 cm LV PW:         1.10 cm LV IVS:        1.00 cm LVOT diam:     1.80 cm LV SV:         38 LV SV Index:   16 LVOT Area:     2.54 cm  RIGHT VENTRICLE RV S prime:     14.30 cm/s TAPSE (M-mode): 1.9 cm LEFT ATRIUM           Index       RIGHT ATRIUM           Index LA diam:      4.80 cm 2.06 cm/m  RA Area:     17.70 cm LA Vol (A4C): 44.0 ml 18.88 ml/m RA Volume:   45.20 ml  19.39 ml/m  AORTIC VALVE LVOT Vmax:   83.90 cm/s LVOT Vmean:  59.500 cm/s LVOT VTI:    0.150 m  AORTA Ao Root diam: 3.40 cm MITRAL VALVE MV Area (PHT): 3.48 cm    SHUNTS MV Decel Time: 218 msec    Systemic VTI:  0.15 m MV E velocity: 73.70 cm/s  Systemic Diam: 1.80 cm Charlton Haws MD Electronically signed by Charlton Haws MD Signature Date/Time: 02/17/2020/3:39:01 PM    Final

## 2020-02-18 NOTE — Progress Notes (Signed)
Lower extremity venous has been completed.   Preliminary results in CV Proc.   Blanch Media 02/18/2020 3:32 PM

## 2020-02-18 NOTE — Progress Notes (Signed)
Spoke with patient's mother via phone. Updated on patient status and plan of care. All questions answered at this time.

## 2020-02-18 NOTE — Progress Notes (Signed)
ANTICOAGULATION CONSULT NOTE - Follow up Pharmacy Consult for Coumadin Indication: history of atrial fibrillation and h/o PE  Allergies  Allergen Reactions  . Pseudoephedrine Other (See Comments)    REACTION: tightness in chest  . Augmentin [Amoxicillin-Pot Clavulanate] Itching and Rash    Did it involve swelling of the face/tongue/throat, SOB, or low BP? No Did it involve sudden or severe rash/hives, skin peeling, or any reaction on the inside of your mouth or nose? Yes Did you need to seek medical attention at a hospital or doctor's office? Yes When did it last happen?2019 If all above answers are "NO", may proceed with cephalosporin use.   . Latex Itching and Rash    Patient Measurements: Height: 5' 5.5" (166.4 cm) Weight: 131 kg (288 lb 12.8 oz) IBW/kg (Calculated) : 58.15  Vital Signs: Temp: 98.6 F (37 C) (05/20 0700) Temp Source: Oral (05/20 0700) BP: 108/57 (05/20 0700) Pulse Rate: 70 (05/20 0700)  Labs: Recent Labs    Mar 10, 2020 0000 03/10/2020 1725 03/10/2020 1725 03/10/2020 2255 02/17/20 0944 02/17/20 0945 02/18/20 0544  HGB  --  10.6*   < >  --   --  9.4* 9.9*  HCT  --  36.5  --   --   --  31.2* 33.0*  PLT  --  307  --   --   --  283 303  LABPROT  --   --   --   --  36.9*  --  39.6*  INR 4.4*  --   --   --  3.9*  --  4.2*  CREATININE  --  1.79*  --   --   --  1.79* 2.07*  TROPONINIHS  --  9  --  10  --   --   --    < > = values in this interval not displayed.    Estimated Creatinine Clearance: 37.8 mL/min (A) (by C-G formula based on SCr of 2.07 mg/dL (H)).   Medical History: Past Medical History:  Diagnosis Date  . Atrial fibrillation (HCC)    with cardioversion success  . Cellulitis 10/2019   right lower extremity  . CHF (congestive heart failure) (HCC)   . COPD (chronic obstructive pulmonary disease) (HCC)   . Cor pulmonale (HCC)    and obesity hypoventilation syndrome  . Diabetes mellitus without complication (HCC)   . GERD  (gastroesophageal reflux disease)   . Hypothyroid   . Morbid obesity (HCC)   . OA (osteoarthritis)   . Pulmonary embolism (HCC)    hx of on coumadin  . Reactive airways dysfunction syndrome [facility])     Assessment: 63yo female admitted with Covid, to continue Coumadin for Afib.   On Coumadin PTA for h/o Afib and h/o PE.  Pt had AC clinic visit 5/18 w/ INR at 4.4 and instructions to hold Coumadin 5/18. >> Admitted 5/18 with COVID  INR went up higher today along with her Ddimer. Hgb is stable at 9.9. We will hold coumadin today.   PTA warfarin dose: 2.5mg  daily except 5 mg q Mon  (INR has been 3.1, 1.8, and 4.4 on this dose since 01/20/20)  Goal of Therapy:  INR 2-3   Plan:  No coumadin today Monitor daily INR for dose adjustments.  Ulyses Southward, PharmD, BCIDP, AAHIVP, CPP Infectious Disease Pharmacist 02/18/2020 10:04 AM

## 2020-02-19 DIAGNOSIS — I4891 Unspecified atrial fibrillation: Secondary | ICD-10-CM

## 2020-02-19 DIAGNOSIS — J1282 Pneumonia due to coronavirus disease 2019: Secondary | ICD-10-CM

## 2020-02-19 DIAGNOSIS — U071 COVID-19: Principal | ICD-10-CM

## 2020-02-19 LAB — COMPREHENSIVE METABOLIC PANEL
ALT: 11 U/L (ref 0–44)
AST: 11 U/L — ABNORMAL LOW (ref 15–41)
Albumin: 2.8 g/dL — ABNORMAL LOW (ref 3.5–5.0)
Alkaline Phosphatase: 66 U/L (ref 38–126)
Anion gap: 9 (ref 5–15)
BUN: 64 mg/dL — ABNORMAL HIGH (ref 8–23)
CO2: 26 mmol/L (ref 22–32)
Calcium: 9.6 mg/dL (ref 8.9–10.3)
Chloride: 102 mmol/L (ref 98–111)
Creatinine, Ser: 2.17 mg/dL — ABNORMAL HIGH (ref 0.44–1.00)
GFR calc Af Amer: 27 mL/min — ABNORMAL LOW (ref >60)
GFR calc non Af Amer: 23 mL/min — ABNORMAL LOW (ref >60)
Glucose, Bld: 190 mg/dL — ABNORMAL HIGH (ref 70–99)
Potassium: 3.9 mmol/L (ref 3.5–5.1)
Sodium: 137 mmol/L (ref 135–145)
Total Bilirubin: 0.9 mg/dL (ref 0.3–1.2)
Total Protein: 6.4 g/dL — ABNORMAL LOW (ref 6.5–8.1)

## 2020-02-19 LAB — PROTIME-INR
INR: 3.3 — ABNORMAL HIGH (ref 0.8–1.2)
Prothrombin Time: 32.3 seconds — ABNORMAL HIGH (ref 11.4–15.2)

## 2020-02-19 LAB — CBC WITH DIFFERENTIAL/PLATELET
Abs Immature Granulocytes: 0.09 10*3/uL — ABNORMAL HIGH (ref 0.00–0.07)
Basophils Absolute: 0 10*3/uL (ref 0.0–0.1)
Basophils Relative: 0 %
Eosinophils Absolute: 0 10*3/uL (ref 0.0–0.5)
Eosinophils Relative: 0 %
HCT: 36 % (ref 36.0–46.0)
Hemoglobin: 10.8 g/dL — ABNORMAL LOW (ref 12.0–15.0)
Immature Granulocytes: 1 %
Lymphocytes Relative: 4 %
Lymphs Abs: 0.3 10*3/uL — ABNORMAL LOW (ref 0.7–4.0)
MCH: 28.6 pg (ref 26.0–34.0)
MCHC: 30 g/dL (ref 30.0–36.0)
MCV: 95.2 fL (ref 80.0–100.0)
Monocytes Absolute: 0.2 10*3/uL (ref 0.1–1.0)
Monocytes Relative: 2 %
Neutro Abs: 8.1 10*3/uL — ABNORMAL HIGH (ref 1.7–7.7)
Neutrophils Relative %: 93 %
Platelets: 323 10*3/uL (ref 150–400)
RBC: 3.78 MIL/uL — ABNORMAL LOW (ref 3.87–5.11)
RDW: 13.5 % (ref 11.5–15.5)
WBC: 8.7 10*3/uL (ref 4.0–10.5)
nRBC: 0 % (ref 0.0–0.2)

## 2020-02-19 LAB — GLUCOSE, CAPILLARY
Glucose-Capillary: 178 mg/dL — ABNORMAL HIGH (ref 70–99)
Glucose-Capillary: 159 mg/dL — ABNORMAL HIGH (ref 70–99)
Glucose-Capillary: 182 mg/dL — ABNORMAL HIGH (ref 70–99)
Glucose-Capillary: 253 mg/dL — ABNORMAL HIGH (ref 70–99)

## 2020-02-19 LAB — D-DIMER, QUANTITATIVE: D-Dimer, Quant: 7.77 ug/mL-FEU — ABNORMAL HIGH (ref 0.00–0.50)

## 2020-02-19 LAB — C-REACTIVE PROTEIN: CRP: 1.8 mg/dL — ABNORMAL HIGH (ref <1.0)

## 2020-02-19 MED ORDER — INSULIN DETEMIR 100 UNIT/ML ~~LOC~~ SOLN
10.0000 [IU] | Freq: Two times a day (BID) | SUBCUTANEOUS | Status: DC
  Administered 2020-02-19 – 2020-02-24 (×12): 10 [IU] via SUBCUTANEOUS
  Filled 2020-02-19 (×13): qty 0.1

## 2020-02-19 MED ORDER — UMECLIDINIUM BROMIDE 62.5 MCG/INH IN AEPB
1.0000 | INHALATION_SPRAY | Freq: Every day | RESPIRATORY_TRACT | Status: DC
  Administered 2020-02-19 – 2020-03-01 (×12): 1 via RESPIRATORY_TRACT
  Filled 2020-02-19 (×2): qty 7

## 2020-02-19 MED ORDER — METHYLPREDNISOLONE SODIUM SUCC 40 MG IJ SOLR
40.0000 mg | Freq: Two times a day (BID) | INTRAMUSCULAR | Status: DC
  Administered 2020-02-19 – 2020-02-20 (×3): 40 mg via INTRAVENOUS
  Filled 2020-02-19 (×3): qty 1

## 2020-02-19 MED ORDER — WARFARIN SODIUM 1 MG PO TABS
1.0000 mg | ORAL_TABLET | Freq: Once | ORAL | Status: AC
  Administered 2020-02-19: 1 mg via ORAL
  Filled 2020-02-19: qty 1

## 2020-02-19 MED ORDER — FLUTICASONE FUROATE-VILANTEROL 200-25 MCG/INH IN AEPB
1.0000 | INHALATION_SPRAY | Freq: Every day | RESPIRATORY_TRACT | Status: DC
  Administered 2020-02-19 – 2020-03-01 (×12): 1 via RESPIRATORY_TRACT
  Filled 2020-02-19: qty 28

## 2020-02-19 NOTE — Progress Notes (Signed)
RN updated son with patient status and plan of care. All questions were answered at this.   RN also assisted PT and OT with helping patient ambulate from the bed to the chair. Patient O2 sats decreased to 78%. RN increased her oxygen to 40L and 100% FiO2.

## 2020-02-19 NOTE — Discharge Instructions (Signed)

## 2020-02-19 NOTE — Progress Notes (Signed)
ANTICOAGULATION CONSULT NOTE  Pharmacy Consult for Coumadin Indication: history of atrial fibrillation and PE  Allergies  Allergen Reactions  . Pseudoephedrine Other (See Comments)    REACTION: tightness in chest  . Augmentin [Amoxicillin-Pot Clavulanate] Itching and Rash    Did it involve swelling of the face/tongue/throat, SOB, or low BP? No Did it involve sudden or severe rash/hives, skin peeling, or any reaction on the inside of your mouth or nose? Yes Did you need to seek medical attention at a hospital or doctor's office? Yes When did it last happen?2019 If all above answers are "NO", may proceed with cephalosporin use.   . Latex Itching and Rash    Patient Measurements: Height: 5' 5.5" (166.4 cm) Weight: 128.1 kg (282 lb 6.6 oz) IBW/kg (Calculated) : 58.15  Vital Signs: Temp: 97.2 F (36.2 C) (05/21 0737) Temp Source: Axillary (05/21 0737) BP: 132/82 (05/21 0806) Pulse Rate: 71 (05/21 0806)  Labs: Recent Labs    02/08/2020 1725 02/13/2020 1725 02/01/2020 2255 02/17/20 0944 02/17/20 0945 02/17/20 0945 02/18/20 0544 02/19/20 0715  HGB 10.6*   < >  --   --  9.4*   < > 9.9* 10.8*  HCT 36.5   < >  --   --  31.2*  --  33.0* 36.0  PLT 307   < >  --   --  283  --  303 323  LABPROT  --   --   --  36.9*  --   --  39.6* 32.3*  INR  --   --   --  3.9*  --   --  4.2* 3.3*  CREATININE 1.79*   < >  --   --  1.79*  --  2.07* 2.17*  TROPONINIHS 9  --  10  --   --   --   --   --    < > = values in this interval not displayed.    Estimated Creatinine Clearance: 35.6 mL/min (A) (by C-G formula based on SCr of 2.17 mg/dL (H)).   Assessment: 85 YOF admitted with Covid, to continue Coumadin for history of Afib and PE.  Patient had [facility] clinic visit on 02/19/2020 with INR at 4.4 and instructed to hold Coumadin on 5/18.  Last Coumadin dose on 02/17/20.  INR trending down significantly and is now slightly supra-therapeutic at 3.3.  No bleeding reported.    Home Coumadin dose: 2.5mg   daily except 5 mg q Mon  Goal of Therapy:  INR 2-3   Plan:  Coumadin 1mg  PO today to minimize another big drop in INR Daily PT / INR  Lenford Beddow D. 11-07-1983, PharmD, BCPS, BCCCP 02/19/2020, 9:31 AM

## 2020-02-19 NOTE — Consult Note (Signed)
   NAME:  Amy Bryant, MRN:  [id number], DOB:  05-Nov-1955, LOS: 3 ADMISSION DATE:  02/03/2020, CONSULTATION DATE: 02/18/2020 REFERRING MD:  Jeoffrey Massed, CHIEF COMPLAINT: COVID-19 pneumonia  Brief History   64 year old with past medical history including COPD, CKD, chronic respiratory failure presenting with COVID-19 pneumonia on 5/18. Treated with steroids, remdesivir and Actemra. PCCM consulted for worsening hypoxia  Past Medical History  COPD with chronic hypoxic respiratory failure requiring 4 L [place] supplemental oxygen at all times Chronic diastolic congestive heart failure PAF on Coumadin Prior history of PE CKD stage IIIb Noninsulin-dependent type 2 diabetes Morbid obesity Reactive airway dysfunction  Osteoarthritis  Hypothyroidism GERD  Significant Hospital Events   5/19 Adit 5/20 Transfer to ICU  Consults:  PCCM  Procedures:    Significant Diagnostic Tests:  Chest x-ray 5/20-cardiomegaly, patchy airspace disease, vascular congestion  Echo 5/19-LVEF 60 to 65%, moderate LA, RA dilation  Micro Data:    Antimicrobials/COVID Rx  Solumedrol5/18>> Remdesivir:5/18>> Actemra: 5/18 x 1  Interim history/subjective:  No events. States she feels better today. Remains on a good deal of oxygen.  Objective   Blood pressure 125/72, pulse 67, temperature 97.7 F (36.5 C), temperature source Axillary, resp. rate 19, height 5' 5.5" (1.664 m), weight 128.1 kg, SpO2 92 %.    FiO2 (%):  [100 %] 100 %   Intake/Output Summary (Last 24 hours) at 02/19/2020 0715 Last data filed at 02/19/2020 0400 Gross per 24 hour  Intake 460 ml  Output 1050 ml  Net -590 ml   Filed Weights   02/17/20 0800 02/18/20 0349 02/19/20 0500  Weight: 131.7 kg 131 kg 128.1 kg    Examination: GEN: no acute distress HEENT: MMM, trachea midline CV: RRR, ext warm PULM: suprisingly clear, no wheezing GI: Soft, +BS EXT: no edema NEURO: moves all 4 ext to command PSYCH: answering questions  appropriately SKIN: chronic venous stasis changes  Cr stable CRP improved CBC benign   Resolved Hospital Problem list     Assessment & Plan:  Severe ARDS secondary to COVID-19 pneumonia Baseline COPD on 4 L oxygen. Encourage proning OOB to chair if able Encourage IS Breo/incruse, may need to switch to nebs depending on her ability to inhale  CHF Paroxysmal atrial fibrillation on Coumadin anticoagulation as outpatient Hold diuresis for now Follow I/O Continue Cardizem Coumadin on hold due to elevated INR  Chronic kidney disease, AKI Follow urine output and creatinine  Diabetes Add levemir, continue SSI  Hypothyroidism- last TSH 5, 04/2019 On Synthroid  Best practice:  Diet: PO diet Pain/Anxiety/Delirium protocol (if indicated): NA VAP protocol (if indicated): NA DVT prophylaxis: Coumadin on hold GI prophylaxis: PPI Glucose control: see above Mobility: Bed Code Status: Full Family Communication: Per primary Disposition: ICU given tenuous respiratory status  The patient is critically ill with multiple organ systems failure and requires high complexity decision making for assessment and support, frequent evaluation and titration of therapies, application of advanced monitoring technologies and extensive interpretation of multiple databases. Critical Care Time devoted to patient care services described in this note independent of APP/resident time (if applicable)  is 32 minutes.   Myrla Halsted MD [facility] 02/19/2020 7:23 AM Personal pager: [phone] If unanswered, please page CCM On-call: #[phone]

## 2020-02-19 NOTE — Progress Notes (Signed)
Physical Therapy Treatment Patient Details Name: Amy Bryant MRN: [id number] DOB: 04-10-1956 Today's Date: 02/19/2020    History of Present Illness 64 y.o. female admitted on 02/21/2020 for SOB.  Dx with COVID 19 PNA and decompensated diastolic heart failure. Pt with significant PMH of DM2, depression/anxiety, PE, PAF, CKD III, COPD (on 4 L O2 [place] at baseline), morbid obestiy, CHF, cellulitis.     PT Comments    On arrival, pt was positive and eager to get moving OOB to chair.  During transfer pt SpO2 dropped to 70% on 40L.  After ~5 minutes her sats rose and stayed about 83%  Follow Up Recommendations  Home health PT     Equipment Recommendations  Other (comment)    Recommendations for Other Services       Precautions / Restrictions Precautions Precautions: Fall;Other (comment) Precaution Comments: monitor sats closely Restrictions Weight Bearing Restrictions: No    Mobility  Bed Mobility Overal bed mobility: Needs Assistance Bed Mobility: Supine to Sit     Supine to sit: Supervision     General bed mobility comments: +use of bed rail, increased time and effort. Needing to sit and catch breath at EOB  Transfers Overall transfer level: Needs assistance Equipment used: 1 person hand held assist Transfers: Sit to/from Stand Sit to Stand: Min assist         General transfer comment: min A to steady over to recliner. De sat to 70% on 40L HFNC and NRB with transfer  Ambulation/Gait                 Stairs             Wheelchair Mobility    Modified Rankin (Stroke Patients Only)       Balance Overall balance assessment: Mild deficits observed, not formally tested                                          Cognition Arousal/Alertness: Awake/alert Behavior During Therapy: WFL for tasks assessed/performed Overall Cognitive Status: Within Functional Limits for tasks assessed                                 General  Comments: less anxious from PT report yesterday. Pt eager to begin mobilization and hopeful to go home      Exercises      General Comments General comments (skin integrity, edema, etc.): Pt on 30 L HHF and 15L NRB mask      Pertinent Vitals/Pain Pain Assessment: No/denies pain    Home Living Family/patient expects to be discharged to:: Private residence Living Arrangements: Non-relatives/Friends Available Help at Discharge: Available 24 hours/day;Friend(s) Type of Home: House Home Access: Ramped entrance   Home Layout: One level Home Equipment: Environmental consultant - 2 wheels;Wheelchair - manual      Prior Function Level of Independence: Needs assistance  Gait / Transfers Assistance Needed: Walks in house with RW ADL's / Homemaking Assistance Needed: sits in [facility] to prepare meals as she cannot stand very long. Comments: on 4L of oxgyen PTA, sleeps in recliner; has HH RN 1 x week through [facility].  Has had therapy in the past, but is not currently active with them.    PT Goals (current goals can now be found in the care plan section) Acute Rehab PT  Goals Patient Stated Goal: to go home and see her grandchildren again PT Goal Formulation: With patient Time For Goal Achievement: 03/03/20 Potential to Achieve Goals: Good Progress towards PT goals: Progressing toward goals    Frequency    Min 3X/week      PT Plan Current plan remains appropriate    Co-evaluation PT/OT/SLP Co-Evaluation/Treatment: Yes Reason for Co-Treatment: For patient/therapist safety PT goals addressed during session: Mobility/safety with mobility OT goals addressed during session: ADL's and self-care      AM-PAC PT "6 Clicks" Mobility   Outcome Measure  Help needed turning from your back to your side while in a flat bed without using bedrails?: A Little Help needed moving from lying on your back to sitting on the side of a flat bed without using bedrails?: A Little Help needed moving to and  from a bed to a chair (including a wheelchair)?: A Little Help needed standing up from a chair using your arms (e.g., wheelchair or bedside chair)?: A Little Help needed to walk in hospital room?: A Little Help needed climbing 3-5 steps with a railing? : Total 6 Click Score: 16    End of Session   Activity Tolerance: Patient tolerated treatment well Patient left: in chair;with call bell/phone within reach;with nursing/sitter in room Nurse Communication: Mobility status PT Visit Diagnosis: Difficulty in walking, not elsewhere classified (R26.2);History of falling (Z91.81);Muscle weakness (generalized) (M62.81)     Time: 2409-7353 PT Time Calculation (min) (ACUTE ONLY): 23 min  Charges:  $Therapeutic Activity: 8-22 mins                     02/19/2020  [name], PT Acute Rehabilitation Services [phone]  (pager) [phone]  (office)   Amy Bryant 02/19/2020, 5:21 PM

## 2020-02-19 NOTE — Progress Notes (Signed)
PROGRESS NOTE                                                                                                                                                                                                             Patient Demographics:    Amy Bryant, is a 64 y.o. female, DOB - [birth year], ZOX:[id number]  Outpatient Primary MD for the patient is [name], [name], MD   Admit date - 02/15/2020   LOS - 3  No chief complaint on file.      Brief Narrative: Patient is a 64 y.o. female with PMHx of COPD with chronic hypoxic respiratory failure on 4 L of oxygen at home, chronic diastolic heart failure, PAF on Coumadin, history of PE, CKD stage IIIb, non-insulin-dependent DM-2, morbid obesity-who presented to the hospital with approximately 2-3-day history of worsening shortness of breath.  For approximately 1 week-she has been having nasal congestion/URI symptoms-her PCP called in prednisone and Zithromax which she took without any relief.  In the emergency room-she was noted to have severe hypoxemia-requiring NRB-she was subsequently admitted to the hospitalist service.  Note-has not received the Covid vaccine.  Significant Events: 5/18>> admit to [facility] for severe hypoxemia requiring nonrebreather mask. 5/19>> on heated high flow 5/20>> on heated high flow+ NRB-transfer to MICU  COVID-19 medications: Steroids: 5/18>> Remdesivir: 5/18>> Actemra: 5/18 x 1  Antibiotics: None  Microbiology data: 5/18: Blood culture>> no growth  Significant studies: 5/20: Bilateral lower extremity Doppler>> negative for DVT. 5/19: Echo>> EF 60-65% 5/19: Chest x-ray>> interstitial pulmonary edema/patchy airspace opacity in the left base 5/18: Chest x-ray>> new diffuse interstitial thickening/reticular opacities suspicious for pulmonary edema  DVT prophylaxis: Coumadin-supratherapeutic INR.  Procedures: None  Consults: PCCM    Subjective:   Feels better-remains on heated high flow and NRB.  Not in any distress.   Assessment  & Plan :   Acute on chronic hypoxic Resp Failure due to Covid 19 Viral pneumonia and decompensated diastolic heart failure: Remains severely hypoxemic-on heated high flow-and NRB.  CRP downtrending.  Continue steroids/remdesivir.  Volume status has markedly improved compared to on admission-diuretics remain on hold due to worsening creatinine.  Remain in the ICU-given tenuous respiratory status.  PCCM following.      Fever: afebrile Prone/Incentive Spirometry: She is unable to prone or lie on her side-have encouraged incentive spirometry/flutter use  5 times an hour  O2 requirements:  SpO2: (!) 89 % O2 Flow Rate (L/min): 30 L/min FiO2 (%): 100 %   COVID-19 Labs: Recent Labs    02/05/2020 1725 02/27/2020 1725 02/17/20 0945 02/18/20 0544 02/19/20 0715  DDIMER 2.37*   < > 3.74* 15.31* 7.77*  FERRITIN 74  --   --   --   --   LDH 259*  --   --   --   --   CRP 6.5*   < > 6.7* 3.7* 1.8*   < > = values in this interval not displayed.       Component Value Date/Time   BNP 233.8 (H) 02/17/2020 0945    Recent Labs  Lab 02/04/2020 1725  PROCALCITON <0.10    Lab Results  Component Value Date   SARSCOV2NAA POSITIVE (A) 02/06/2020   SARSCOV2NAA NEGATIVE 10/06/2019   SARSCOV2NAA NOT DETECTED 04/08/2019   SARSCOV2NAA NOT DETECTED 04/02/2019    Decompensated diastolic heart failure: Volume status has improved-due to bump in creatinine-continue to hold Lasix.  Continue to follow volume status/electrolytes/intake and output.   Elevated D-dimer: Dimer significantly elevated likely secondary to COVID-19-rather than VTE-INR remains supratherapeutic-on chronic Coumadin therapy-lower extremity Doppler negative for DVT.  COPD: Not in exacerbation this morning-no wheezing-decrease steroids to twice daily dosing.  Continue bronchodilators.  Normally on 4 L of oxygen at all times at home.  AKI on CKD  stage IIIb: Slight worsening in creatinine today-continue to hold Lasix.  Follow.    PAF: Rate controlled with Cardizem-INR remains supratherapeutic-pharmacy managing Coumadin.  Remote history of pulmonary embolism: INR supratherapeutic-Coumadin on hold-pharmacy following.  Hypothyroidism: Continue Synthroid  Depression/anxiety: Slightly anxious due to acute illness/hypoxia/oxygen requirement-continue as needed Xanax and Prozac.  DM-2 (A1c 5.2 on 10/07/2019): CBGs stable on SSI-follow and adjust.  Recent Labs    02/18/20 2215 02/19/20 0737 02/19/20 1148  GLUCAP 250* 178* 159*   Morbid Obesity: Estimated body mass index is 46.28 kg/m as calculated from the following:   Height as of this encounter: 5' 5.5" (1.664 m).   Weight as of this encounter: 128.1 kg.    GI prophylaxis: PPI  ABG:    Component Value Date/Time   PHART 7.360 04/02/2019 1702   PCO2ART 45.1 04/02/2019 1702   PO2ART 122 (H) 04/02/2019 1702   HCO3 24.8 04/02/2019 1702   TCO2 29.1 10/01/2009 0925   O2SAT 98.7 04/02/2019 1702    Vent Settings: N/A  Condition - Extremely Guarded-very tenuous with risk for further deterioration  Family Communication  : Prefers that she update her family-she does not want me to call her mother or her son-she fears that her family will panic.  Apparently her dad was recently diagnosed with cancer.  Code Status :  Full Code  Diet :  Diet Order            Diet heart healthy/carb modified Room service appropriate? Yes; Fluid consistency: Thin; Fluid restriction: 1200 mL Fluid  Diet effective now               Disposition Plan  :   Status is: Inpatient  Remains inpatient appropriate because:Inpatient level of care appropriate due to severity of illness   Dispo: The patient is from: Home              Anticipated d/c is to: SNF              Anticipated d/c date is: > 3 days  Patient currently is not medically stable to d/c.  Barriers to discharge: Severe  Hypoxia requiring O2 supplementation/complete 5 days of IV Remdesivir  Antimicorbials  :    Anti-infectives (From admission, onward)   Start     Dose/Rate Route Frequency Ordered Stop   02/17/20 1000  remdesivir 100 mg in sodium chloride 0.9 % 100 mL IVPB     100 mg 200 mL/hr over 30 Minutes Intravenous Daily Mar 17, 2020 2202 02/21/20 0959   03/17/2020 2215  remdesivir 200 mg in sodium chloride 0.9% 250 mL IVPB     200 mg 580 mL/hr over 30 Minutes Intravenous Once 2020-03-17 2202 17-Mar-2020 2319      Inpatient Medications  Scheduled Meds: . vitamin C  500 mg Oral Daily  . Chlorhexidine Gluconate Cloth  6 each Topical Daily  . cholecalciferol  1,000 Units Oral Daily  . diltiazem  120 mg Oral Daily  . FLUoxetine  40 mg Oral Daily  . fluticasone  1 spray Each Nare Daily  . fluticasone furoate-vilanterol  1 puff Inhalation Daily  . insulin aspart  0-5 Units Subcutaneous QHS  . insulin aspart  0-9 Units Subcutaneous TID WC  . insulin detemir  10 Units Subcutaneous BID  . levothyroxine  175 mcg Oral Q0600  . methylPREDNISolone (SOLU-MEDROL) injection  40 mg Intravenous Q12H  . pantoprazole  40 mg Oral Q1200  . sodium chloride flush  3 mL Intravenous Q12H  . umeclidinium bromide  1 puff Inhalation Daily  . warfarin  1 mg Oral ONCE-1600  . Warfarin - Pharmacist Dosing Inpatient   Does not apply q1600  . zinc sulfate  220 mg Oral Daily   Continuous Infusions: . sodium chloride    . remdesivir 100 mg in NS 100 mL 100 mg (02/19/20 1148)   PRN Meds:.sodium chloride, acetaminophen, albuterol, ALPRAZolam, chlorpheniramine-HYDROcodone, guaiFENesin-dextromethorphan, oxymetazoline, sodium chloride, sodium chloride flush, traMADol   Time Spent in minutes  35    See all Orders from today for further details   Jeoffrey Massed M.D on 02/19/2020 at 3:30 PM  To page go to [email or web address] - use universal password  [facility] -  Office  [phone]    Objective:   Vitals:   02/19/20  0739 02/19/20 0806 02/19/20 0900 02/19/20 1152  BP:  132/82 136/73   Pulse:  71    Resp:  (!) 25    Temp:    97.7 F (36.5 C)  TempSrc:    Axillary  SpO2: 91% (!) 89%    Weight:      Height:        Wt Readings from Last 3 Encounters:  02/19/20 128.1 kg  01/28/20 132 kg  01/06/20 132 kg     Intake/Output Summary (Last 24 hours) at 02/19/2020 1530 Last data filed at 02/19/2020 1148 Gross per 24 hour  Intake 460 ml  Output 1150 ml  Net -690 ml     Physical Exam Gen Exam:Alert awake-not in any distress HEENT:atraumatic, normocephalic Chest: B/L clear to auscultation anteriorly CVS:S1S2 regular Abdomen:soft non tender, non distended Extremities:trace edema Neurology: Non focal Skin: no rash   Data Review:    CBC Recent Labs  Lab 03-17-2020 1725 02/17/20 0945 02/18/20 0544 02/19/20 0715  WBC 7.1 2.7* 5.7 8.7  HGB 10.6* 9.4* 9.9* 10.8*  HCT 36.5 31.2* 33.0* 36.0  PLT 307 283 303 323  MCV 99.2 95.7 95.9 95.2  MCH 28.8 28.8 28.8 28.6  MCHC 29.0* 30.1 30.0 30.0  RDW 13.7 13.6 13.6 13.5  LYMPHSABS 0.5* 0.3* 0.4* 0.3*  MONOABS 0.3 0.1 0.2 0.2  EOSABS 0.0 0.0 0.0 0.0  BASOSABS 0.0 0.0 0.0 0.0    Chemistries  Recent Labs  Lab March 03, 2020 1725 02/17/20 0945 02/18/20 0544 02/19/20 0715  NA 141 139 140 137  K 4.1 4.2 4.1 3.9  CL 105 103 104 102  CO2 22 24 27 26   GLUCOSE 125* 157* 184* 190*  BUN 37* 39* 52* 64*  CREATININE 1.79* 1.79* 2.07* 2.17*  CALCIUM 9.8 9.5 9.6 9.6  AST 19 13* 14* 11*  ALT 13 11 9 11   ALKPHOS 68 66 62 66  BILITOT 0.6 0.4 0.3 0.9   ------------------------------------------------------------------------------------------------------------------ Recent Labs    2020/03/03 1725  TRIG 143    Lab Results  Component Value Date   HGBA1C 5.8 (H) 02/17/2020   ------------------------------------------------------------------------------------------------------------------ No results for input(s): TSH, T4TOTAL, T3FREE, THYROIDAB in the  last 72 hours.  Invalid input(s): FREET3 ------------------------------------------------------------------------------------------------------------------ Recent Labs    Mar 03, 2020 1725  FERRITIN 74    Coagulation profile Recent Labs  Lab 2020/03/03 0000 02/17/20 0944 02/18/20 0544 02/19/20 0715  INR 4.4* 3.9* 4.2* 3.3*    Recent Labs    02/18/20 0544 02/19/20 0715  DDIMER 15.31* 7.77*    Cardiac Enzymes No results for input(s): CKMB, TROPONINI, MYOGLOBIN in the last 168 hours.  Invalid input(s): CK ------------------------------------------------------------------------------------------------------------------    Component Value Date/Time   BNP 233.8 (H) 02/17/2020 0945    Micro Results Recent Results (from the past 240 hour(s))  SARS CORONAVIRUS 2 (TAT 6-24 HRS) Nasopharyngeal Nasopharyngeal Swab     Status: Abnormal   Collection Time: 03-Mar-2020  5:25 PM   Specimen: Nasopharyngeal Swab  Result Value Ref Range Status   SARS Coronavirus 2 POSITIVE (A) NEGATIVE Final    Comment: RESULT CALLED TO, READ BACK BY AND VERIFIED WITH: RN S GRINDSTAFF @0028  02/17/20 BY S GEZAHEGN (NOTE) SARS-CoV-2 target nucleic acids are DETECTED. The SARS-CoV-2 RNA is generally detectable in upper and lower respiratory specimens during the acute phase of infection. Positive results are indicative of the presence of SARS-CoV-2 RNA. Clinical correlation with patient history and other diagnostic information is  necessary to determine patient infection status. Positive results do not rule out bacterial infection or co-infection with other viruses.  The expected result is Negative. Fact Sheet for Patients: SugarRoll.be Fact Sheet for Healthcare Providers: https://www.woods-mathews.com/ This test is not yet approved or cleared by the [place] FDA and  has been authorized for detection and/or diagnosis of SARS-CoV-2 by FDA under an Emergency Use  Authorization (EUA). This EUA will remain  in effect (meaning this test can be used)  for the duration of the COVID-19 declaration under Section 564(b)(1) of the Act, 21 U.S.C. section 360bbb-3(b)(1), unless the authorization is terminated or revoked sooner. Performed at [facility], [place] [address] [place], [place] [address]   Blood Culture (routine x 2)     Status: None (Preliminary result)   Collection Time: 03-03-20  5:28 PM   Specimen: BLOOD  Result Value Ref Range Status   Specimen Description BLOOD RIGHT ANTECUBITAL  Final   Special Requests   Final    BOTTLES DRAWN AEROBIC AND ANAEROBIC Blood Culture adequate volume   Culture   Final    NO GROWTH 3 DAYS Performed at [facility], [place] [address] [place], [place] [address]    Report Status PENDING  Incomplete  Blood Culture (routine x 2)     Status: None (Preliminary result)  Collection Time: 02-19-2020  5:47 PM   Specimen: BLOOD LEFT FOREARM  Result Value Ref Range Status   Specimen Description BLOOD LEFT FOREARM  Final   Special Requests   Final    BOTTLES DRAWN AEROBIC AND ANAEROBIC Blood Culture adequate volume   Culture   Final    NO GROWTH 3 DAYS Performed at [facility], [address], [place], [place] [address]    Report Status PENDING  Incomplete    Radiology Reports DG Chest Port 1 View  Result Date: 19-Feb-2020 CLINICAL DATA:  Dyspnea, fever. EXAM: PORTABLE CHEST 1 VIEW COMPARISON:  Radiograph 10/06/2019 FINDINGS: Significant interval change from prior exam with diffuse interstitial thickening and reticular opacities. There is also perivascular haziness. Cardiomegaly with unchanged mediastinal contours. Aortic atherosclerosis. Possible small pleural effusions. No visualized pneumothorax. IMPRESSION: New diffuse interstitial thickening and reticular opacities suspicious for pulmonary edema. Similar cardiomegaly. Possible small pleural effusions. Findings suspicious for CHF. Electronically  Signed   By: Narda Rutherford M.D.   On: 02-19-20 18:11   DG Chest Port 1V same Day  Result Date: 02/17/2020 CLINICAL DATA:  Shortness of breath EXAM: PORTABLE CHEST 1 VIEW COMPARISON:  02-19-20 FINDINGS: There is cardiomegaly with pulmonary venous hypertension. There is interstitial edema with patchy airspace opacity in the left base. No adenopathy. There is aortic atherosclerosis. No bone lesions. IMPRESSION: Cardiomegaly with pulmonary vascular congestion. There is interstitial pulmonary edema. Suspect a degree of congestive heart failure. Patchy airspace opacity in the left base is concerning for superimposed pneumonia, although alveolar edema in this area could present in this manner. Both pneumonia and alveolar edema could present concurrently. In comparison with 1 day prior, there appears to be somewhat less interstitial edema. Aortic Atherosclerosis (ICD10-I70.0). Electronically Signed   By: Bretta Bang III M.D.   On: 02/17/2020 08:01   ECHOCARDIOGRAM COMPLETE  Result Date: 02/17/2020    ECHOCARDIOGRAM REPORT   Patient Name:   Amy Bryant Date of Exam: 02/17/2020 Medical Rec #:  [id number]      Height:       65.5 in Accession #:    [id number]     Weight:       290.3 lb Date of Birth:  Jul 28, 1956      BSA:          2.331 m Patient Age:    63 years       BP:           120/67 mmHg Patient Gender: F              HR:           75 bpm. Exam Location:  Inpatient Procedure: 2D Echo Indications:    CHF 428  History:        Patient has prior history of Echocardiogram examinations, most                 recent 01/09/2019. CHF, COPD, Arrythmias:Atrial Fibrillation;                 Risk Factors:Diabetes and Former Smoker. Covid +. cor pulmonale.                 pulmonary embolism.  Sonographer:    Celene Skeen RDCS (AE) Referring Phys: [id number] John Giovanni  Sonographer Comments: Patient is morbidly obese. Image acquisition challenging due to patient body habitus and Image acquisition challenging  due to respiratory motion. limited mobility IMPRESSIONS  1. Left ventricular ejection fraction, by  estimation, is 60 to 65%. The left ventricle has normal function. The left ventricle has no regional wall motion abnormalities. Left ventricular diastolic parameters are indeterminate.  2. Right ventricular systolic function was not well visualized. The right ventricular size is not well visualized.  3. Left atrial size was moderately dilated.  4. Right atrial size was mild to moderately dilated.  5. The mitral valve is normal in structure. No evidence of mitral valve regurgitation. No evidence of mitral stenosis.  6. The aortic valve was not well visualized. Aortic valve regurgitation is trivial. Mild to moderate aortic valve sclerosis/calcification is present, without any evidence of aortic stenosis.  7. The inferior vena cava not well seen. FINDINGS  Left Ventricle: Left ventricular ejection fraction, by estimation, is 60 to 65%. The left ventricle has normal function. The left ventricle has no regional wall motion abnormalities. The left ventricular internal cavity size was normal in size. There is  no left ventricular hypertrophy. Left ventricular diastolic parameters are indeterminate. Right Ventricle: The right ventricular size is not well visualized. Right vetricular wall thickness was not assessed. Right ventricular systolic function was not well visualized. Left Atrium: Left atrial size was moderately dilated. Right Atrium: Right atrial size was mild to moderately dilated. Pericardium: There is no evidence of pericardial effusion. Mitral Valve: The mitral valve is normal in structure. There is mild thickening of the mitral valve leaflet(s). There is mild calcification of the mitral valve leaflet(s). Normal mobility of the mitral valve leaflets. Moderate mitral annular calcification. No evidence of mitral valve regurgitation. No evidence of mitral valve stenosis. Tricuspid Valve: The tricuspid valve is normal  in structure. Tricuspid valve regurgitation is mild . No evidence of tricuspid stenosis. Aortic Valve: The aortic valve was not well visualized. Aortic valve regurgitation is trivial. Mild to moderate aortic valve sclerosis/calcification is present, without any evidence of aortic stenosis. Pulmonic Valve: The pulmonic valve was normal in structure. Pulmonic valve regurgitation is not visualized. No evidence of pulmonic stenosis. Aorta: The aortic root is normal in size and structure. Venous: The inferior vena cava not well seen. IAS/Shunts: The interatrial septum was not well visualized.  LEFT VENTRICLE PLAX 2D LVIDd:         5.30 cm LVIDs:         3.40 cm LV PW:         1.10 cm LV IVS:        1.00 cm LVOT diam:     1.80 cm LV SV:         38 LV SV Index:   16 LVOT Area:     2.54 cm  RIGHT VENTRICLE RV S prime:     14.30 cm/s TAPSE (M-mode): 1.9 cm LEFT ATRIUM           Index       RIGHT ATRIUM           Index LA diam:      4.80 cm 2.06 cm/m  RA Area:     17.70 cm LA Vol (A4C): 44.0 ml 18.88 ml/m RA Volume:   45.20 ml  19.39 ml/m  AORTIC VALVE LVOT Vmax:   83.90 cm/s LVOT Vmean:  59.500 cm/s LVOT VTI:    0.150 m  AORTA Ao Root diam: 3.40 cm MITRAL VALVE MV Area (PHT): 3.48 cm    SHUNTS MV Decel Time: 218 msec    Systemic VTI:  0.15 m MV E velocity: 73.70 cm/s  Systemic Diam: 1.80 cm Charlton Haws MD Electronically  signed by Charlton Haws MD Signature Date/Time: 02/17/2020/3:39:01 PM    Final    VAS [place] LOWER EXTREMITY VENOUS (DVT)  Result Date: 02/18/2020  Lower Venous DVTStudy Indications: Swelling, and Edema.  Comparison Study: no prior Performing Technologist: Blanch Media RVS  Examination Guidelines: A complete evaluation includes B-mode imaging, spectral Doppler, color Doppler, and power Doppler as needed of all accessible portions of each vessel. Bilateral testing is considered an integral part of a complete examination. Limited examinations for reoccurring indications may be performed as noted. The  reflux portion of the exam is performed with the patient in reverse Trendelenburg.  +---------+---------------+---------+-----------+----------+--------------+ RIGHT    CompressibilityPhasicitySpontaneityPropertiesThrombus Aging +---------+---------------+---------+-----------+----------+--------------+ CFV      Full           Yes      Yes                                 +---------+---------------+---------+-----------+----------+--------------+ SFJ      Full                                                        +---------+---------------+---------+-----------+----------+--------------+ FV Prox  Full                                                        +---------+---------------+---------+-----------+----------+--------------+ FV Mid   Full                                                        +---------+---------------+---------+-----------+----------+--------------+ FV DistalFull                                                        +---------+---------------+---------+-----------+----------+--------------+ PFV      Full                                                        +---------+---------------+---------+-----------+----------+--------------+ POP      Full           Yes      Yes                                 +---------+---------------+---------+-----------+----------+--------------+ PTV      Full                                                        +---------+---------------+---------+-----------+----------+--------------+ PERO  Full                                                        +---------+---------------+---------+-----------+----------+--------------+   +---------+---------------+---------+-----------+----------+--------------+ LEFT     CompressibilityPhasicitySpontaneityPropertiesThrombus Aging +---------+---------------+---------+-----------+----------+--------------+ CFV      Full           Yes       Yes                                 +---------+---------------+---------+-----------+----------+--------------+ SFJ      Full                                                        +---------+---------------+---------+-----------+----------+--------------+ FV Prox  Full                                                        +---------+---------------+---------+-----------+----------+--------------+ FV Mid   Full                                                        +---------+---------------+---------+-----------+----------+--------------+ FV DistalFull                                                        +---------+---------------+---------+-----------+----------+--------------+ PFV      Full                                                        +---------+---------------+---------+-----------+----------+--------------+ POP      Full           Yes      Yes                                 +---------+---------------+---------+-----------+----------+--------------+ PTV      Full                                                        +---------+---------------+---------+-----------+----------+--------------+ PERO     Full                                                        +---------+---------------+---------+-----------+----------+--------------+  Summary: BILATERAL: - No evidence of deep vein thrombosis seen in the lower extremities, bilaterally. -   *See table(s) above for measurements and observations. Electronically signed by Sherald Hesshristopher Clark MD on 02/18/2020 at 5:31:51 PM.    Final

## 2020-02-19 NOTE — Evaluation (Signed)
Occupational Therapy Evaluation Patient Details Name: Amy Bryant MRN: [id number] DOB: April 02, 1956 Today's Date: 02/19/2020    History of Present Illness 64 y.o. female admitted on 2020/02/24 for SOB.  Dx with COVID 19 PNA and decompensated diastolic heart failure. Pt with significant PMH of DM2, depression/anxiety, PE, PAF, CKD III, COPD (on 4 L O2 [place] at baseline), morbid obestiy, CHF, cellulitis.    Clinical Impression   PTA pt living with a friend,  Mod independent for BADL/IADL. Pt endorses using RW for mobility and taking frequent rest breaks as needed- is on 4L [place] at baseline. At time of eval, pt on 30 L HHFNC and NRB with SpO2 sats 83%. Pt was able to complete bed mobility at supervision level, where she stayed seated ~3 mins to maintain SpO2 sats >80%. Pt then completed stand pivot transfer to recliner with min A for steadying support. SpO2 sats were 72% once seated in chair and remaining in 70s despite pursed lip breathing. Pt increased to 40L HHFNC and NRB to recover ~5 mins. Educated pt on IS and flutter usage to continue to improve respiratory status. Given current status, recommend HHOT to continue to progress BADL participation prior to d/c home. OT will continue to follow while acute.     Follow Up Recommendations  Home health OT    Equipment Recommendations  None recommended by OT    Recommendations for Other Services       Precautions / Restrictions Precautions Precautions: Fall;Other (comment) Precaution Comments: monitor sats closely Restrictions Weight Bearing Restrictions: No      Mobility Bed Mobility Overal bed mobility: Needs Assistance Bed Mobility: Supine to Sit     Supine to sit: Supervision     General bed mobility comments: +use of bed rail, increased time and effort. Needing to sit and catch breath at EOB  Transfers Overall transfer level: Needs assistance Equipment used: 1 person hand held assist Transfers: Sit to/from Stand Sit to Stand: Min  assist         General transfer comment: min A to steady over to recliner. De sat to 70% on 40L HFNC and NRB with transfer    Balance Overall balance assessment: Mild deficits observed, not formally tested                                         ADL either performed or assessed with clinical judgement   ADL Overall ADL's : Needs assistance/impaired Eating/Feeding: Set up;Sitting   Grooming: Set up;Sitting   Upper Body Bathing: Set up;Sitting   Lower Body Bathing: Minimal assistance;Sit to/from stand;Sitting/lateral leans   Upper Body Dressing : Set up;Sitting   Lower Body Dressing: Minimal assistance;Sit to/from stand;Sitting/lateral leans   Toilet Transfer: Min Manufacturing systems engineer Details (indicate cue type and reason): stand pivot to recliner to simulate transfer. Pt is mostly limited by SOB and poor activity tolerance. Needs ~5 mins to recover after transfer. Sats down to 70% with stand pivot Toileting- Clothing Manipulation and Hygiene: Set up;Sitting/lateral lean;Sit to/from stand   Tub/ Shower Transfer: Min guard;Shower seat;Ambulation   Functional mobility during ADLs: Minimal assistance(stand pivot only) General ADL Comments: largely limited in BADL 2/2 respiratory status and SOB with BADL participation     Vision Patient Visual Report: No change from baseline       Perception     Praxis      Pertinent Vitals/Pain Pain  Assessment: No/denies pain     Hand Dominance Right   Extremity/Trunk Assessment Upper Extremity Assessment Upper Extremity Assessment: Generalized weakness;LUE deficits/detail LUE Deficits / Details: decreased ROM at baseline   Lower Extremity Assessment Lower Extremity Assessment: Defer to PT evaluation       Communication Communication Communication: No difficulties   Cognition Arousal/Alertness: Awake/alert Behavior During Therapy: WFL for tasks assessed/performed Overall Cognitive Status:  Within Functional Limits for tasks assessed                                 General Comments: less anxious from PT report yesterday. Pt eager to begin mobilization and hopeful to go home   General Comments       Exercises     Shoulder Instructions      Home Living Family/patient expects to be discharged to:: Private residence Living Arrangements: Non-relatives/Friends Available Help at Discharge: Available 24 hours/day;Friend(s) Type of Home: House Home Access: Ramped entrance     Home Layout: One level     Bathroom Shower/Tub: Chief Strategy Officer: Standard     Home Equipment: Environmental consultant - 2 wheels;Wheelchair - manual          Prior Functioning/Environment Level of Independence: Needs assistance  Gait / Transfers Assistance Needed: Walks in house with RW ADL's / Homemaking Assistance Needed: sits in [facility] to prepare meals as she cannot stand very long.   Comments: on 4L of [employer] PTA, sleeps in recliner; has HH RN 1 x week through [facility].  Has had therapy in the past, but is not currently active with them.         OT Problem List: Decreased strength;Decreased knowledge of use of DME or AE;Decreased knowledge of precautions;Cardiopulmonary status limiting activity;Decreased activity tolerance;Impaired balance (sitting and/or standing)      OT Treatment/Interventions: Self-care/ADL training;Therapeutic exercise;Patient/family education;Balance training;Energy conservation;Therapeutic activities;DME and/or AE instruction    OT Goals(Current goals can be found in the care plan section) Acute Rehab OT Goals Patient Stated Goal: to go home and see her grandchildren again OT Goal Formulation: With patient Time For Goal Achievement: 03/04/20 Potential to Achieve Goals: Good  OT Frequency: Min 2X/week   Barriers to D/C:            Co-evaluation              AM-PAC OT "6 Clicks" Daily Activity     Outcome Measure Help from  another person eating meals?: None Help from another person taking care of personal grooming?: None Help from another person toileting, which includes using toliet, bedpan, or urinal?: A Little Help from another person bathing (including washing, rinsing, drying)?: A Little Help from another person to put on and taking off regular upper body clothing?: None Help from another person to put on and taking off regular lower body clothing?: A Little 6 Click Score: 21   End of Session Equipment Utilized During Treatment: Gait belt;Oxygen Nurse Communication: Mobility status  Activity Tolerance: Patient tolerated treatment well Patient left: in chair;with call bell/phone within reach  OT Visit Diagnosis: Unsteadiness on feet (R26.81);Other abnormalities of gait and mobility (R26.89)                Time: 1856-3149 OT Time Calculation (min): 23 min Charges:  OT General Charges $OT Visit: 1 Visit OT Evaluation $OT Eval Moderate Complexity: 1 Mod  Dalphine Handing, MSOT, OTR/L Acute Rehabilitation Services [facility] Office  Number: [phone] Pager: [place] 02/19/2020, 5:12 PM

## 2020-02-20 ENCOUNTER — Inpatient Hospital Stay (HOSPITAL_COMMUNITY): Payer: Medicare HMO

## 2020-02-20 LAB — CBC WITH DIFFERENTIAL/PLATELET
Abs Immature Granulocytes: 0.09 10*3/uL — ABNORMAL HIGH (ref 0.00–0.07)
Basophils Absolute: 0 10*3/uL (ref 0.0–0.1)
Basophils Relative: 0 %
Eosinophils Absolute: 0 10*3/uL (ref 0.0–0.5)
Eosinophils Relative: 0 %
HCT: 35.3 % — ABNORMAL LOW (ref 36.0–46.0)
Hemoglobin: 10.8 g/dL — ABNORMAL LOW (ref 12.0–15.0)
Immature Granulocytes: 1 %
Lymphocytes Relative: 4 %
Lymphs Abs: 0.3 10*3/uL — ABNORMAL LOW (ref 0.7–4.0)
MCH: 28.6 pg (ref 26.0–34.0)
MCHC: 30.6 g/dL (ref 30.0–36.0)
MCV: 93.6 fL (ref 80.0–100.0)
Monocytes Absolute: 0.2 10*3/uL (ref 0.1–1.0)
Monocytes Relative: 2 %
Neutro Abs: 8.7 10*3/uL — ABNORMAL HIGH (ref 1.7–7.7)
Neutrophils Relative %: 93 %
Platelets: 319 10*3/uL (ref 150–400)
RBC: 3.77 MIL/uL — ABNORMAL LOW (ref 3.87–5.11)
RDW: 13.2 % (ref 11.5–15.5)
WBC: 9.4 10*3/uL (ref 4.0–10.5)
nRBC: 0 % (ref 0.0–0.2)

## 2020-02-20 LAB — COMPREHENSIVE METABOLIC PANEL
ALT: 10 U/L (ref 0–44)
AST: 9 U/L — ABNORMAL LOW (ref 15–41)
Albumin: 2.7 g/dL — ABNORMAL LOW (ref 3.5–5.0)
Alkaline Phosphatase: 60 U/L (ref 38–126)
Anion gap: 10 (ref 5–15)
BUN: 70 mg/dL — ABNORMAL HIGH (ref 8–23)
CO2: 27 mmol/L (ref 22–32)
Calcium: 9.6 mg/dL (ref 8.9–10.3)
Chloride: 102 mmol/L (ref 98–111)
Creatinine, Ser: 1.98 mg/dL — ABNORMAL HIGH (ref 0.44–1.00)
GFR calc Af Amer: 30 mL/min — ABNORMAL LOW (ref >60)
GFR calc non Af Amer: 26 mL/min — ABNORMAL LOW (ref >60)
Glucose, Bld: 188 mg/dL — ABNORMAL HIGH (ref 70–99)
Potassium: 4 mmol/L (ref 3.5–5.1)
Sodium: 139 mmol/L (ref 135–145)
Total Bilirubin: 1 mg/dL (ref 0.3–1.2)
Total Protein: 6.2 g/dL — ABNORMAL LOW (ref 6.5–8.1)

## 2020-02-20 LAB — MAGNESIUM: Magnesium: 2.2 mg/dL (ref 1.7–2.4)

## 2020-02-20 LAB — C-REACTIVE PROTEIN: CRP: 1.1 mg/dL — ABNORMAL HIGH (ref <1.0)

## 2020-02-20 LAB — GLUCOSE, CAPILLARY
Glucose-Capillary: 161 mg/dL — ABNORMAL HIGH (ref 70–99)
Glucose-Capillary: 171 mg/dL — ABNORMAL HIGH (ref 70–99)
Glucose-Capillary: 245 mg/dL — ABNORMAL HIGH (ref 70–99)
Glucose-Capillary: 188 mg/dL — ABNORMAL HIGH (ref 70–99)
Glucose-Capillary: 186 mg/dL — ABNORMAL HIGH (ref 70–99)

## 2020-02-20 LAB — PROTIME-INR
INR: 2.4 — ABNORMAL HIGH (ref 0.8–1.2)
Prothrombin Time: 25.3 seconds — ABNORMAL HIGH (ref 11.4–15.2)

## 2020-02-20 LAB — BRAIN NATRIURETIC PEPTIDE: B Natriuretic Peptide: 187.1 pg/mL — ABNORMAL HIGH (ref 0.0–100.0)

## 2020-02-20 LAB — PROCALCITONIN: Procalcitonin: <0.1 ng/mL

## 2020-02-20 LAB — D-DIMER, QUANTITATIVE: D-Dimer, Quant: 6.11 ug/mL-FEU — ABNORMAL HIGH (ref 0.00–0.50)

## 2020-02-20 MED ORDER — FUROSEMIDE 10 MG/ML IJ SOLN
60.0000 mg | Freq: Once | INTRAMUSCULAR | Status: AC
  Administered 2020-02-20: 60 mg via INTRAVENOUS
  Filled 2020-02-20: qty 6

## 2020-02-20 MED ORDER — WARFARIN SODIUM 2 MG PO TABS
2.0000 mg | ORAL_TABLET | Freq: Once | ORAL | Status: AC
  Administered 2020-02-20: 2 mg via ORAL
  Filled 2020-02-20: qty 1

## 2020-02-20 MED ORDER — LIP MEDEX EX OINT
TOPICAL_OINTMENT | CUTANEOUS | Status: DC | PRN
  Filled 2020-02-20: qty 7

## 2020-02-20 NOTE — Progress Notes (Signed)
   NAME:  Amy Bryant, MRN:  [id number], DOB:  Apr 06, 1956, LOS: 4 ADMISSION DATE:  02-17-20, CONSULTATION DATE: 02/18/2020 REFERRING MD:  Jeoffrey Massed, CHIEF COMPLAINT: COVID-19 pneumonia  Brief History   64 year old with past medical history including COPD, CKD, chronic respiratory failure presenting with COVID-19 pneumonia on 5/18. Treated with steroids, remdesivir and Actemra. PCCM consulted for worsening hypoxia  Past Medical History  COPD with chronic hypoxic respiratory failure requiring 4 L [place] supplemental oxygen at all times Chronic diastolic congestive heart failure PAF on Coumadin Prior history of PE CKD stage IIIb Noninsulin-dependent type 2 diabetes Morbid obesity Reactive airway dysfunction  Osteoarthritis  Hypothyroidism GERD  Significant Hospital Events   5/19 Adit 5/20 Transfer to ICU  Consults:  PCCM  Procedures:    Significant Diagnostic Tests:  Chest x-ray 5/20-cardiomegaly, patchy airspace disease, vascular congestion  Echo 5/19-LVEF 60 to 65%, moderate LA, RA dilation  Micro Data:    Antimicrobials/COVID Rx  Solumedrol5/18>> Remdesivir:5/18>> Actemra: 5/18 x 1  Interim history/subjective:   Desaturated with transfer to chair High flow nasal cannula 30 L/min CBG 159-253 I/O- 3.1 L total  Objective   Blood pressure 125/78, pulse 61, temperature (!) 96.7 F (35.9 C), temperature source Axillary, resp. rate 17, height 5' 5.5" (1.664 m), weight 131.6 kg, SpO2 98 %.    FiO2 (%):  [100 %] 100 %   Intake/Output Summary (Last 24 hours) at 02/20/2020 0631 Last data filed at 02/20/2020 0600 Gross per 24 hour  Intake --  Output 1700 ml  Net -1700 ml   Filed Weights   02/18/20 0349 02/19/20 0500 02/20/20 0500  Weight: 131 kg 128.1 kg 131.6 kg    Examination: GEN: Pleasant obese woman, slightly tachypneic but no distress HEENT: Oropharynx clear, no upper airway noise CV: Regular, distant, no murmur PULM: Tachypneic but good air  movement, decreased at both bases but no crackles or wheezes GI:, Nondistended, positive bowel sounds EXT: No edema NEURO: Awake, alert, interacting appropriately, follows commands, moves all extremities SKIN: No rash, chronic venous stasis changes lower extremities    Resolved Hospital Problem list     Assessment & Plan:  Severe ARDS secondary to COVID-19 pneumonia Baseline COPD on 4 L oxygen. Wean oxygen as able Remdesivir, plan 5 days Solu-Medrol, plan 10 days Self proning if patient can tolerate Continue Breo, Incruse for now, may need to change to nebs depending on her ability to adequately deliver Pulmonary hygiene  CHF Paroxysmal atrial fibrillation on Coumadin anticoagulation as outpatient Diuresis as blood pressure and renal function can tolerate, held on 5/21 Follow I/O closely Diltiazem as ordered Follow INR off Coumadin temporarily  Chronic kidney disease, AKI Follow urine output, BMP Avoid nephrotoxins  Diabetes Sliding-scale insulin sensitive scale, may need to increase Levemir 10 units twice daily  Hypothyroidism- last TSH 5, 04/2019 Continue Synthroid  Best practice:  Diet: PO diet Pain/Anxiety/Delirium protocol (if indicated): NA VAP protocol (if indicated): NA DVT prophylaxis: Coumadin on hold GI prophylaxis: PPI Glucose control: see above Mobility: Bed Code Status: Full Family Communication: Per primary Disposition: ICU    Levy Pupa, MD, PhD 02/20/2020, 6:37 AM [facility] [phone] or if no answer [phone]

## 2020-02-20 NOTE — Progress Notes (Signed)
PROGRESS NOTE                                                                                                                                                                                                             Patient Demographics:    Amy Bryant, is a 64 y.o. female, DOB - 12-05-1955, ZOX:[id number]  Outpatient Primary MD for the patient is [name], Audrie GallusMarne A, MD   Admit date - 02/14/2020   LOS - 4  CC - SOB     Brief Narrative:  Patient is a 64 y.o. female with PMHx of COPD with chronic hypoxic respiratory failure on 4 L of oxygen at home, chronic diastolic heart failure, PAF on Coumadin, history of PE, CKD stage IIIb, non-insulin-dependent DM-2, morbid obesity-who presented to the hospital with approximately 2-3-day history of worsening shortness of breath.  For approximately 1 week-she has been having nasal congestion/URI symptoms-her PCP called in prednisone and Zithromax which she took without any relief.  In the emergency room-she was noted to have severe hypoxemia-requiring NRB-she was subsequently admitted to the hospitalist service.  Note-has not received the Covid vaccine.  Significant Events: 5/18>> [facility] hypox[facility] requiring nonrebreather mask. 5/19>> on heated high flow 5/20>> on heated high flow+ NRB-transfer to MICU  COVID-19 medications: Steroids: 5/18>> Remdesivir: 5/18>> Actemra: 5/18 x 1  Antibiotics: None  Microbiology data: 5/18: Blood culture>> no growth  Significant studies: 5/20: Bilateral lower extremity Doppler>> negative for DVT. 5/19: Echo>> EF 60-65% 5/19: Chest x-ray>> interstitial pulmonary edema/patchy airspace opacity in the left base 5/18: Chest x-ray>> new diffuse interstitial thickening/reticular opacities suspicious for pulmonary edema  DVT prophylaxis: Coumadin-supratherapeutic INR.  Procedures: None  Consults: PCCM    Subjective:   Patient  in bed, on heated high flow plus nonrebreather mask, appears to be in no distress states she feels a little better in the last day or so, no chest abdominal pain no focal weakness.   Assessment  & Plan :   Acute on chronic hypoxic Resp Failure due to Covid 19 Viral pneumonia and decompensated diastolic heart failure: She had severe disease and likely presented once her parenchymal damage was quite advanced due to COVID-19 pneumonia and inflammation, received appropriate care with IV steroids, remdesivir and Actemra.  Continue oxygen supplementation and monitor closely.  Remains very tenuous.  She is  currently on IV steroid taper along with IV Lasix as needed, she has finished remdesivir and Actemra treatment.   Fever: afebrile Prone/Incentive Spirometry: She is unable to prone or lie on her side-have encouraged incentive spirometry/flutter use 5 times an hour  O2 requirements:  SpO2: 94 % O2 Flow Rate (L/min): 25 L/min(+ 15L NRB) FiO2 (%): 100 %   COVID-19 Labs: Recent Labs    02/18/20 0544 02/19/20 0715 02/20/20 0607  DDIMER 15.31* 7.77* 6.11*  CRP 3.7* 1.8* 1.1*       Component Value Date/Time   BNP 187.1 (H) 02/20/2020 0530    Recent Labs  Lab 02/23/2020 1725  PROCALCITON <0.10    Lab Results  Component Value Date   SARSCOV2NAA POSITIVE (A) 02/05/2020   SARSCOV2NAA NEGATIVE 10/06/2019   SARSCOV2NAA NOT DETECTED 04/08/2019   SARSCOV2NAA NOT DETECTED 04/02/2019    Decompensated acute on chronic diastolic heart failure EF 60%: Lasix ordered for 02/20/2020 will monitor and adjust as needed.  Elevated D-dimer: Dimer significantly elevated likely secondary to COVID-19-rather than VTE-INR remains supratherapeutic-on chronic Coumadin therapy-lower extremity Doppler negative for DVT.  COPD: Not in exacerbation this morning-no wheezing-decrease steroids to twice daily dosing.  Continue bronchodilators.  Normally on 4 L of oxygen at all times at home.  AKI on CKD stage IIIb:  Baseline creatinine around 1.8, monitor cautiously with diuresis.    PAF with [place] vas 2 score of at least 3: Rate controlled with Cardizem-INR remains supratherapeutic-pharmacy managing Coumadin.  Remote history of pulmonary embolism: INR supratherapeutic-Coumadin on hold-pharmacy following.  Hypothyroidism: Continue Synthroid  Depression/anxiety: Slightly anxious due to acute illness/hypoxia/oxygen requirement-continue as needed Xanax and Prozac.  DM-2 (A1c 5.2 on 10/07/2019): CBGs stable on SSI-follow and adjust.  Recent Labs    02/19/20 1549 02/19/20 2043 02/20/20 0733  GLUCAP 182* 253* 161*   Morbid Obesity: Estimated body mass index is 47.55 kg/m as calculated from the following:   Height as of this encounter: 5' 5.5" (1.664 m).   Weight as of this encounter: 131.6 kg.    GI prophylaxis: PPI  Condition - Extremely Guarded-very tenuous with risk for further deterioration  Family Communication  : Prefers that she update her family-she does not want me to call her mother or her son-she fears that her family will panic   Code Status :  Full Code  Diet :  Diet Order            Diet heart healthy/carb modified Room service appropriate? Yes; Fluid consistency: Thin; Fluid restriction: 1200 mL Fluid  Diet effective now               Disposition Plan  :   Status is: Inpatient  Remains inpatient appropriate because:Inpatient level of care appropriate due to severity of illness   Dispo: The patient is from: Home              Anticipated d/c is to: SNF              Anticipated d/c date is: > 3 days              Patient currently is not medically stable to d/c.  Barriers to discharge: Severe Hypoxia requiring O2 supplementation/complete 5 days of IV Remdesivir  Antimicorbials  :    Anti-infectives (From admission, onward)   Start     Dose/Rate Route Frequency Ordered Stop   02/17/20 1000  remdesivir 100 mg in sodium chloride 0.9 % 100 mL IVPB     100  mg 200 mL/hr  over 30 Minutes Intravenous Daily 2020/02/21 2202 02/20/20 0955   2020-02-21 2215  remdesivir 200 mg in sodium chloride 0.9% 250 mL IVPB     200 mg 580 mL/hr over 30 Minutes Intravenous Once 02/21/2020 2202 21-Feb-2020 2319      Inpatient Medications  Scheduled Meds: . vitamin C  500 mg Oral Daily  . Chlorhexidine Gluconate Cloth  6 each Topical Daily  . cholecalciferol  1,000 Units Oral Daily  . diltiazem  120 mg Oral Daily  . FLUoxetine  40 mg Oral Daily  . fluticasone  1 spray Each Nare Daily  . fluticasone furoate-vilanterol  1 puff Inhalation Daily  . furosemide  60 mg Intravenous Once  . insulin aspart  0-5 Units Subcutaneous QHS  . insulin aspart  0-9 Units Subcutaneous TID WC  . insulin detemir  10 Units Subcutaneous BID  . levothyroxine  175 mcg Oral Q0600  . methylPREDNISolone (SOLU-MEDROL) injection  40 mg Intravenous Q12H  . pantoprazole  40 mg Oral Q1200  . sodium chloride flush  3 mL Intravenous Q12H  . umeclidinium bromide  1 puff Inhalation Daily  . warfarin  2 mg Oral ONCE-1600  . Warfarin - Pharmacist Dosing Inpatient   Does not apply q1600  . zinc sulfate  220 mg Oral Daily   Continuous Infusions: . sodium chloride     PRN Meds:.sodium chloride, acetaminophen, albuterol, ALPRAZolam, chlorpheniramine-HYDROcodone, guaiFENesin-dextromethorphan, lip balm, oxymetazoline, sodium chloride, sodium chloride flush, traMADol   Time Spent in minutes  35    See all Orders from today for further details   Susa Raring M.D on 02/20/2020 at 10:02 AM  To page go to [email or web address] - use universal password  [facility] -  Office  [phone]    Objective:   Vitals:   02/20/20 0500 02/20/20 0605 02/20/20 0720 02/20/20 0811  BP: 125/78 118/83    Pulse: 61 67  62  Resp: 17 (!) 22  15  Temp:   (!) 96.4 F (35.8 C)   TempSrc:   Axillary   SpO2: 98% 92%  94%  Weight: 131.6 kg     Height:        Wt Readings from Last 3 Encounters:  02/20/20 131.6 kg  01/28/20  132 kg  01/06/20 132 kg     Intake/Output Summary (Last 24 hours) at 02/20/2020 1002 Last data filed at 02/20/2020 0600 Gross per 24 hour  Intake --  Output 1400 ml  Net -1400 ml     Physical Exam  Awake Alert, No new F.N deficits, Normal affect [place].AT,PERRAL Supple Neck,No JVD, No cervical lymphadenopathy appriciated.  Symmetrical Chest wall movement, Good air movement bilaterally, +ve rales RRR,No Gallops, Rubs or new Murmurs, No Parasternal Heave +ve B.Sounds, Abd Soft, No tenderness, No organomegaly appriciated, No rebound - guarding or rigidity. No Cyanosis, Clubbing or edema, No new Rash or bruise    Data Review:    CBC Recent Labs  Lab 2020-02-21 1725 02/17/20 0945 02/18/20 0544 02/19/20 0715 02/20/20 0607  WBC 7.1 2.7* 5.7 8.7 9.4  HGB 10.6* 9.4* 9.9* 10.8* 10.8*  HCT 36.5 31.2* 33.0* 36.0 35.3*  PLT 307 283 303 323 319  MCV 99.2 95.7 95.9 95.2 93.6  MCH 28.8 28.8 28.8 28.6 28.6  MCHC 29.0* 30.1 30.0 30.0 30.6  RDW 13.7 13.6 13.6 13.5 13.2  LYMPHSABS 0.5* 0.3* 0.4* 0.3* 0.3*  MONOABS 0.3 0.1 0.2 0.2 0.2  EOSABS 0.0 0.0 0.0 0.0 0.0  BASOSABS 0.0 0.0 0.0  0.0 0.0    Chemistries  Recent Labs  Lab Feb 18, 2020 1725 02/17/20 0945 02/18/20 0544 02/19/20 0715 02/20/20 0530 02/20/20 0607  NA 141 139 140 137  --  139  K 4.1 4.2 4.1 3.9  --  4.0  CL 105 103 104 102  --  102  CO2 --  27  GLUCOSE 125* 157* 184* 190*  --  188*  BUN 37* 39* 52* 64*  --  70*  CREATININE 1.79* 1.79* 2.07* 2.17*  --  1.98*  CALCIUM 9.8 9.5 9.6 9.6  --  9.6  MG  --   --   --   --  2.2  --   AST 19 13* 14* 11*  --  9*  ALT --  10  ALKPHOS 68 66 62 66  --  60  BILITOT 0.6 0.4 0.3 0.9  --  1.0   ------------------------------------------------------------------------------------------------------------------ No results for input(s): CHOL, HDL, LDLCALC, TRIG, CHOLHDL, LDLDIRECT in the last 72 hours.  Lab Results  Component Value Date   HGBA1C 5.8 (H)  02/17/2020   ------------------------------------------------------------------------------------------------------------------ No results for input(s): TSH, T4TOTAL, T3FREE, THYROIDAB in the last 72 hours.  Invalid input(s): FREET3 ------------------------------------------------------------------------------------------------------------------ No results for input(s): VITAMINB12, FOLATE, FERRITIN, TIBC, IRON, RETICCTPCT in the last 72 hours.  Coagulation profile Recent Labs  Lab 18-Feb-2020 0000 02/17/20 0944 02/18/20 0544 02/19/20 0715 02/20/20 0607  INR 4.4* 3.9* 4.2* 3.3* 2.4*    Recent Labs    02/19/20 0715 02/20/20 0607  DDIMER 7.77* 6.11*    Cardiac Enzymes No results for input(s): CKMB, TROPONINI, MYOGLOBIN in the last 168 hours.  Invalid input(s): CK ------------------------------------------------------------------------------------------------------------------    Component Value Date/Time   BNP 187.1 (H) 02/20/2020 0530    Micro Results Recent Results (from the past 240 hour(s))  SARS CORONAVIRUS 2 (TAT 6-24 HRS) Nasopharyngeal Nasopharyngeal Swab     Status: Abnormal   Collection Time: Feb 18, 2020  5:25 PM   Specimen: Nasopharyngeal Swab  Result Value Ref Range Status   SARS Coronavirus 2 POSITIVE (A) NEGATIVE Final    Comment: RESULT CALLED TO, READ BACK BY AND VERIFIED WITH: RN S GRINDSTAFF  02/17/20 BY S GEZAHEGN (NOTE) SARS-CoV-2 target nucleic acids are DETECTED. The SARS-CoV-2 RNA is generally detectable in upper and lower respiratory specimens during the acute phase of infection. Positive results are indicative of the presence of SARS-CoV-2 RNA. Clinical correlation with patient history and other diagnostic information is  necessary to determine patient infection status. Positive results do not rule out bacterial infection or co-infection with other viruses.  The expected result is Negative. Fact Sheet for  Patients: HairSlick.no Fact Sheet for Healthcare Providers: quierodirigir.com This test is not yet approved or cleared by the [place] FDA and  has been authorized for detection and/or diagnosis of SARS-CoV-2 by FDA under an Emergency Use Authorization (EUA). This EUA will remain  in effect (meaning this test can be used)  for the duration of the COVID-19 declaration under Section 564(b)(1) of the Act, 21 U.S.C. section 360bbb-3(b)(1), unless the authorization is terminated or revoked sooner. Performed at [facility], [address] [place], [place] [address]   Blood Culture (routine x 2)     Status: None (Preliminary result)   Collection Time: 02-18-20  5:28 PM   Specimen: BLOOD  Result Value Ref Range Status   Specimen Description BLOOD RIGHT ANTECUBITAL  Final   Special Requests   Final  BOTTLES DRAWN AEROBIC AND ANAEROBIC Blood Culture adequate volume   Culture   Final    NO GROWTH 4 DAYS Performed at [facility], [address], [place], [place] [address]    Report Status PENDING  Incomplete  Blood Culture (routine x 2)     Status: None (Preliminary result)   Collection Time: 19-Feb-2020  5:47 PM   Specimen: BLOOD LEFT FOREARM  Result Value Ref Range Status   Specimen Description BLOOD LEFT FOREARM  Final   Special Requests   Final    BOTTLES DRAWN AEROBIC AND ANAEROBIC Blood Culture adequate volume   Culture   Final    NO GROWTH 4 DAYS Performed at [facility], [address], [place], [place] [address]    Report Status PENDING  Incomplete    Radiology Reports DG Chest Port 1 View  Result Date: 02/20/2020 CLINICAL DATA:  Shortness of breath, history of COVID-19 positivity EXAM: PORTABLE CHEST 1 VIEW COMPARISON:  02/17/2020 FINDINGS: Cardiac shadow is stable but enlarged. Aortic calcifications are seen. Patchy opacities are noted primarily in the left base consistent with the given clinical  history. Mild vascular congestion is noted centrally. No other focal abnormality is seen. IMPRESSION: Mild vascular congestion. Persistent opacity in the left base consistent with the given clinical history. Electronically Signed   By: Alcide Clever M.D.   On: 02/20/2020 08:29   DG Chest Port 1 View  Result Date: 02/19/2020 CLINICAL DATA:  Dyspnea, fever. EXAM: PORTABLE CHEST 1 VIEW COMPARISON:  Radiograph 10/06/2019 FINDINGS: Significant interval change from prior exam with diffuse interstitial thickening and reticular opacities. There is also perivascular haziness. Cardiomegaly with unchanged mediastinal contours. Aortic atherosclerosis. Possible small pleural effusions. No visualized pneumothorax. IMPRESSION: New diffuse interstitial thickening and reticular opacities suspicious for pulmonary edema. Similar cardiomegaly. Possible small pleural effusions. Findings suspicious for CHF. Electronically Signed   By: Narda Rutherford M.D.   On: 19-Feb-2020 18:11   DG Chest Port 1V same Day  Result Date: 02/17/2020 CLINICAL DATA:  Shortness of breath EXAM: PORTABLE CHEST 1 VIEW COMPARISON:  Feb 19, 2020 FINDINGS: There is cardiomegaly with pulmonary venous hypertension. There is interstitial edema with patchy airspace opacity in the left base. No adenopathy. There is aortic atherosclerosis. No bone lesions. IMPRESSION: Cardiomegaly with pulmonary vascular congestion. There is interstitial pulmonary edema. Suspect a degree of congestive heart failure. Patchy airspace opacity in the left base is concerning for superimposed pneumonia, although alveolar edema in this area could present in this manner. Both pneumonia and alveolar edema could present concurrently. In comparison with 1 day prior, there appears to be somewhat less interstitial edema. Aortic Atherosclerosis (ICD10-I70.0). Electronically Signed   By: Bretta Bang III M.D.   On: 02/17/2020 08:01   ECHOCARDIOGRAM COMPLETE  Result Date: 02/17/2020     ECHOCARDIOGRAM REPORT   Patient Name:   SAXON CROSBY Date of Exam: 02/17/2020 Medical Rec #:  [id number]      Height:       65.5 in Accession #:    [id number]     Weight:       290.3 lb Date of Birth:  08-23-1956      BSA:          2.331 m Patient Age:    63 years       BP:           120/67 mmHg Patient Gender: F  HR:           75 bpm. Exam Location:  Inpatient Procedure: 2D Echo Indications:    CHF 428  History:        Patient has prior history of Echocardiogram examinations, most                 recent 01/09/2019. CHF, COPD, Arrythmias:Atrial Fibrillation;                 Risk Factors:Diabetes and Former Smoker. Covid +. cor pulmonale.                 pulmonary embolism.  Sonographer:    Jannett Celestine RDCS (AE) Referring Phys: [id number] Shela Leff  Sonographer Comments: Patient is morbidly obese. Image acquisition challenging due to patient body habitus and Image acquisition challenging due to respiratory motion. limited mobility IMPRESSIONS  1. Left ventricular ejection fraction, by estimation, is 60 to 65%. The left ventricle has normal function. The left ventricle has no regional wall motion abnormalities. Left ventricular diastolic parameters are indeterminate.  2. Right ventricular systolic function was not well visualized. The right ventricular size is not well visualized.  3. Left atrial size was moderately dilated.  4. Right atrial size was mild to moderately dilated.  5. The mitral valve is normal in structure. No evidence of mitral valve regurgitation. No evidence of mitral stenosis.  6. The aortic valve was not well visualized. Aortic valve regurgitation is trivial. Mild to moderate aortic valve sclerosis/calcification is present, without any evidence of aortic stenosis.  7. The inferior vena cava not well seen. FINDINGS  Left Ventricle: Left ventricular ejection fraction, by estimation, is 60 to 65%. The left ventricle has normal function. The left ventricle has no regional wall motion  abnormalities. The left ventricular internal cavity size was normal in size. There is  no left ventricular hypertrophy. Left ventricular diastolic parameters are indeterminate. Right Ventricle: The right ventricular size is not well visualized. Right vetricular wall thickness was not assessed. Right ventricular systolic function was not well visualized. Left Atrium: Left atrial size was moderately dilated. Right Atrium: Right atrial size was mild to moderately dilated. Pericardium: There is no evidence of pericardial effusion. Mitral Valve: The mitral valve is normal in structure. There is mild thickening of the mitral valve leaflet(s). There is mild calcification of the mitral valve leaflet(s). Normal mobility of the mitral valve leaflets. Moderate mitral annular calcification. No evidence of mitral valve regurgitation. No evidence of mitral valve stenosis. Tricuspid Valve: The tricuspid valve is normal in structure. Tricuspid valve regurgitation is mild . No evidence of tricuspid stenosis. Aortic Valve: The aortic valve was not well visualized. Aortic valve regurgitation is trivial. Mild to moderate aortic valve sclerosis/calcification is present, without any evidence of aortic stenosis. Pulmonic Valve: The pulmonic valve was normal in structure. Pulmonic valve regurgitation is not visualized. No evidence of pulmonic stenosis. Aorta: The aortic root is normal in size and structure. Venous: The inferior vena cava not well seen. IAS/Shunts: The interatrial septum was not well visualized.  LEFT VENTRICLE PLAX 2D LVIDd:         5.30 cm LVIDs:         3.40 cm LV PW:         1.10 cm LV IVS:        1.00 cm LVOT diam:     1.80 cm LV SV:         38 LV SV Index:   16 LVOT Area:  2.54 cm  RIGHT VENTRICLE RV S prime:     14.30 cm/s TAPSE (M-mode): 1.9 cm LEFT ATRIUM           Index       RIGHT ATRIUM           Index LA diam:      4.80 cm 2.06 cm/m  RA Area:     17.70 cm LA Vol (A4C): 44.0 ml 18.88 ml/m RA Volume:    45.20 ml  19.39 ml/m  AORTIC VALVE LVOT Vmax:   83.90 cm/s LVOT Vmean:  59.500 cm/s LVOT VTI:    0.150 m  AORTA Ao Root diam: 3.40 cm MITRAL VALVE MV Area (PHT): 3.48 cm    SHUNTS MV Decel Time: 218 msec    Systemic VTI:  0.15 m MV E velocity: 73.70 cm/s  Systemic Diam: 1.80 cm Charlton Haws MD Electronically signed by Charlton Haws MD Signature Date/Time: 02/17/2020/3:39:01 PM    Final    VAS [place] LOWER EXTREMITY VENOUS (DVT)  Result Date: 02/18/2020  Lower Venous DVTStudy Indications: Swelling, and Edema.  Comparison Study: no prior Performing Technologist: Blanch Media RVS  Examination Guidelines: A complete evaluation includes B-mode imaging, spectral Doppler, color Doppler, and power Doppler as needed of all accessible portions of each vessel. Bilateral testing is considered an integral part of a complete examination. Limited examinations for reoccurring indications may be performed as noted. The reflux portion of the exam is performed with the patient in reverse Trendelenburg.  +---------+---------------+---------+-----------+----------+--------------+ RIGHT    CompressibilityPhasicitySpontaneityPropertiesThrombus Aging +---------+---------------+---------+-----------+----------+--------------+ CFV      Full           Yes      Yes                                 +---------+---------------+---------+-----------+----------+--------------+ SFJ      Full                                                        +---------+---------------+---------+-----------+----------+--------------+ FV Prox  Full                                                        +---------+---------------+---------+-----------+----------+--------------+ FV Mid   Full                                                        +---------+---------------+---------+-----------+----------+--------------+ FV DistalFull                                                         +---------+---------------+---------+-----------+----------+--------------+ PFV      Full                                                        +---------+---------------+---------+-----------+----------+--------------+  POP      Full           Yes      Yes                                 +---------+---------------+---------+-----------+----------+--------------+ PTV      Full                                                        +---------+---------------+---------+-----------+----------+--------------+ PERO     Full                                                        +---------+---------------+---------+-----------+----------+--------------+   +---------+---------------+---------+-----------+----------+--------------+ LEFT     CompressibilityPhasicitySpontaneityPropertiesThrombus Aging +---------+---------------+---------+-----------+----------+--------------+ CFV      Full           Yes      Yes                                 +---------+---------------+---------+-----------+----------+--------------+ SFJ      Full                                                        +---------+---------------+---------+-----------+----------+--------------+ FV Prox  Full                                                        +---------+---------------+---------+-----------+----------+--------------+ FV Mid   Full                                                        +---------+---------------+---------+-----------+----------+--------------+ FV DistalFull                                                        +---------+---------------+---------+-----------+----------+--------------+ PFV      Full                                                        +---------+---------------+---------+-----------+----------+--------------+ POP      Full           Yes      Yes                                  +---------+---------------+---------+-----------+----------+--------------+  PTV      Full                                                        +---------+---------------+---------+-----------+----------+--------------+ PERO     Full                                                        +---------+---------------+---------+-----------+----------+--------------+     Summary: BILATERAL: - No evidence of deep vein thrombosis seen in the lower extremities, bilaterally. -   *See table(s) above for measurements and observations. Electronically signed by Sherald Hess MD on 02/18/2020 at 5:31:51 PM.    Final

## 2020-02-20 NOTE — Progress Notes (Signed)
ANTICOAGULATION CONSULT NOTE  Pharmacy Consult for Coumadin Indication: history of atrial fibrillation and PE  Allergies  Allergen Reactions  . Pseudoephedrine Other (See Comments)    REACTION: tightness in chest  . Augmentin [Amoxicillin-Pot Clavulanate] Itching and Rash    Did it involve swelling of the face/tongue/throat, SOB, or low BP? No Did it involve sudden or severe rash/hives, skin peeling, or any reaction on the inside of your mouth or nose? Yes Did you need to seek medical attention at a hospital or doctor's office? Yes When did it last happen?2019 If all above answers are "NO", may proceed with cephalosporin use.   . Latex Itching and Rash    Patient Measurements: Height: 5' 5.5" (166.4 cm) Weight: 131.6 kg (290 lb 2 oz) IBW/kg (Calculated) : 58.15  Vital Signs: Temp: 96.7 F (35.9 C) (05/22 0400) Temp Source: Axillary (05/22 0400) BP: 118/83 (05/22 0605) Pulse Rate: 67 (05/22 0605)  Labs: Recent Labs    02/18/20 0544 02/18/20 0544 02/19/20 0715 02/20/20 0607  HGB 9.9*   < > 10.8* 10.8*  HCT 33.0*  --  36.0 35.3*  PLT 303  --  323 319  LABPROT 39.6*  --  32.3* 25.3*  INR 4.2*  --  3.3* 2.4*  CREATININE 2.07*  --  2.17* 1.98*   < > = values in this interval not displayed.    Estimated Creatinine Clearance: 39.7 mL/min (A) (by C-G formula based on SCr of 1.98 mg/dL (H)).   Assessment: 30 YOF admitted with Covid, to continue Coumadin for history of Afib and PE.  Patient had [facility] clinic visit on 02/06/2020 with INR at 4.4 and instructed to hold Coumadin on 5/18.  Last Coumadin dose on 02/17/20.  INR trending down significantly and therapeutic today at 2.4 (large drop of 0.9 after holding x1 day then lower dose of 1mg  yesterday).  No bleeding reported.    Home Coumadin dose: 2.5mg  daily except 5 mg q Mon  Goal of Therapy:  INR 2-3   Plan:  Coumadin 2mg  PO today Daily PT / INR  , PharmD, BCPS, BCCCP Clinical Pharmacist Please  refer to [facility] for [facility] numbers 02/20/2020, 7:54 AM

## 2020-02-21 LAB — CULTURE, BLOOD (ROUTINE X 2)
Culture: NO GROWTH
Culture: NO GROWTH
Special Requests: ADEQUATE
Special Requests: ADEQUATE

## 2020-02-21 LAB — COMPREHENSIVE METABOLIC PANEL
ALT: 10 U/L (ref 0–44)
AST: 12 U/L — ABNORMAL LOW (ref 15–41)
Albumin: 2.8 g/dL — ABNORMAL LOW (ref 3.5–5.0)
Alkaline Phosphatase: 61 U/L (ref 38–126)
Anion gap: 10 (ref 5–15)
BUN: 73 mg/dL — ABNORMAL HIGH (ref 8–23)
CO2: 26 mmol/L (ref 22–32)
Calcium: 9.5 mg/dL (ref 8.9–10.3)
Chloride: 103 mmol/L (ref 98–111)
Creatinine, Ser: 1.9 mg/dL — ABNORMAL HIGH (ref 0.44–1.00)
GFR calc Af Amer: 32 mL/min — ABNORMAL LOW (ref >60)
GFR calc non Af Amer: 27 mL/min — ABNORMAL LOW (ref >60)
Glucose, Bld: 176 mg/dL — ABNORMAL HIGH (ref 70–99)
Potassium: 4.3 mmol/L (ref 3.5–5.1)
Sodium: 139 mmol/L (ref 135–145)
Total Bilirubin: 0.8 mg/dL (ref 0.3–1.2)
Total Protein: 6.3 g/dL — ABNORMAL LOW (ref 6.5–8.1)

## 2020-02-21 LAB — BRAIN NATRIURETIC PEPTIDE: B Natriuretic Peptide: 141.2 pg/mL — ABNORMAL HIGH (ref 0.0–100.0)

## 2020-02-21 LAB — CBC WITH DIFFERENTIAL/PLATELET
Abs Immature Granulocytes: 0.14 10*3/uL — ABNORMAL HIGH (ref 0.00–0.07)
Basophils Absolute: 0 10*3/uL (ref 0.0–0.1)
Basophils Relative: 0 %
Eosinophils Absolute: 0 10*3/uL (ref 0.0–0.5)
Eosinophils Relative: 0 %
HCT: 36.8 % (ref 36.0–46.0)
Hemoglobin: 11.4 g/dL — ABNORMAL LOW (ref 12.0–15.0)
Immature Granulocytes: 1 %
Lymphocytes Relative: 3 %
Lymphs Abs: 0.3 10*3/uL — ABNORMAL LOW (ref 0.7–4.0)
MCH: 28.6 pg (ref 26.0–34.0)
MCHC: 31 g/dL (ref 30.0–36.0)
MCV: 92.2 fL (ref 80.0–100.0)
Monocytes Absolute: 0.4 10*3/uL (ref 0.1–1.0)
Monocytes Relative: 3 %
Neutro Abs: 11.2 10*3/uL — ABNORMAL HIGH (ref 1.7–7.7)
Neutrophils Relative %: 93 %
Platelets: 319 10*3/uL (ref 150–400)
RBC: 3.99 MIL/uL (ref 3.87–5.11)
RDW: 13.2 % (ref 11.5–15.5)
WBC: 12 10*3/uL — ABNORMAL HIGH (ref 4.0–10.5)
nRBC: 0 % (ref 0.0–0.2)

## 2020-02-21 LAB — GLUCOSE, CAPILLARY
Glucose-Capillary: 141 mg/dL — ABNORMAL HIGH (ref 70–99)
Glucose-Capillary: 130 mg/dL — ABNORMAL HIGH (ref 70–99)
Glucose-Capillary: 222 mg/dL — ABNORMAL HIGH (ref 70–99)
Glucose-Capillary: 269 mg/dL — ABNORMAL HIGH (ref 70–99)

## 2020-02-21 LAB — PROTIME-INR
INR: 2.4 — ABNORMAL HIGH (ref 0.8–1.2)
Prothrombin Time: 25.7 seconds — ABNORMAL HIGH (ref 11.4–15.2)

## 2020-02-21 LAB — PROCALCITONIN: Procalcitonin: <0.1 ng/mL

## 2020-02-21 LAB — MAGNESIUM: Magnesium: 2.2 mg/dL (ref 1.7–2.4)

## 2020-02-21 LAB — C-REACTIVE PROTEIN: CRP: 0.7 mg/dL (ref <1.0)

## 2020-02-21 LAB — D-DIMER, QUANTITATIVE: D-Dimer, Quant: 3.69 ug/mL-FEU — ABNORMAL HIGH (ref 0.00–0.50)

## 2020-02-21 MED ORDER — DILTIAZEM HCL ER COATED BEADS 120 MG PO CP24
120.0000 mg | ORAL_CAPSULE | Freq: Once | ORAL | Status: AC
  Administered 2020-02-21: 120 mg via ORAL
  Filled 2020-02-21: qty 1

## 2020-02-21 MED ORDER — FUROSEMIDE 10 MG/ML IJ SOLN
40.0000 mg | Freq: Once | INTRAMUSCULAR | Status: AC
  Administered 2020-02-21: 40 mg via INTRAVENOUS
  Filled 2020-02-21: qty 4

## 2020-02-21 MED ORDER — WARFARIN SODIUM 2 MG PO TABS
2.0000 mg | ORAL_TABLET | Freq: Once | ORAL | Status: AC
  Administered 2020-02-21: 2 mg via ORAL
  Filled 2020-02-21: qty 1

## 2020-02-21 MED ORDER — METHYLPREDNISOLONE SODIUM SUCC 40 MG IJ SOLR
40.0000 mg | Freq: Every day | INTRAMUSCULAR | Status: DC
  Administered 2020-02-21 – 2020-02-24 (×4): 40 mg via INTRAVENOUS
  Filled 2020-02-21 (×4): qty 1

## 2020-02-21 MED ORDER — DILTIAZEM HCL ER COATED BEADS 240 MG PO CP24
240.0000 mg | ORAL_CAPSULE | Freq: Every day | ORAL | Status: DC
  Administered 2020-02-22 – 2020-02-25 (×4): 240 mg via ORAL
  Filled 2020-02-21 (×4): qty 1

## 2020-02-21 MED ORDER — DILTIAZEM HCL 25 MG/5ML IV SOLN
10.0000 mg | Freq: Four times a day (QID) | INTRAVENOUS | Status: DC | PRN
  Filled 2020-02-21: qty 5

## 2020-02-21 NOTE — Progress Notes (Signed)
   NAME:  Amy Bryant, MRN:  [id number], DOB:  March 14, 1956, LOS: 5 ADMISSION DATE:  02-20-20, CONSULTATION DATE: 02/18/2020 REFERRING MD:  Jeoffrey Massed, CHIEF COMPLAINT: COVID-19 pneumonia  Brief History   64 year old with past medical history including COPD, CKD, chronic respiratory failure presenting with COVID-19 pneumonia on 5/18. Treated with steroids, remdesivir and Actemra. PCCM consulted for worsening hypoxia  Past Medical History  COPD with chronic hypoxic respiratory failure requiring 4 L [place] supplemental oxygen at all times Chronic diastolic congestive heart failure PAF on Coumadin Prior history of PE CKD stage IIIb Noninsulin-dependent type 2 diabetes Morbid obesity Reactive airway dysfunction  Osteoarthritis  Hypothyroidism GERD  Significant Hospital Events   5/19 Adit 5/20 Transfer to ICU  Consults:  PCCM  Procedures:    Significant Diagnostic Tests:  Chest x-ray 5/20-cardiomegaly, patchy airspace disease, vascular congestion  Echo 5/19-LVEF 60 to 65%, moderate LA, RA dilation  Micro Data:    Antimicrobials/COVID Rx  Solumedrol5/18>> Remdesivir:5/18>> 5/23 Actemra: 5/18 x 1  Interim history/subjective:   Currently he does have slow 35 L/min, FiO2 90% I/O- 3.7 L total Feels better, stronger, less dyspneic    Objective   Blood pressure 123/65, pulse (!) 52, temperature (!) 97.4 F (36.3 C), temperature source Axillary, resp. rate 19, height 5' 5.5" (1.664 m), weight 132.9 kg, SpO2 97 %.    FiO2 (%):  [90 %-100 %] 90 %   Intake/Output Summary (Last 24 hours) at 02/21/2020 0704 Last data filed at 02/21/2020 0600 Gross per 24 hour  Intake 720 ml  Output 1325 ml  Net -605 ml   Filed Weights   02/19/20 0500 02/20/20 0500 02/21/20 0600  Weight: 128.1 kg 131.6 kg 132.9 kg    Examination: GEN: Pleasant obese woman, more comfortable respiratory pattern, sitting up in bed HEENT: Oropharynx clear, strong voice, no stridor CV: Regular,  distant, no murmur PULM: Decreased at both bases, no crackles, no wheezes GI:, Nondistended, positive bowel sounds EXT: No edema NEURO: Awake, alert, interacts appropriately, follows commands, moves all extremities SKIN: No rash    Resolved Hospital Problem list     Assessment & Plan:  Severe ARDS secondary to COVID-19 pneumonia Baseline COPD on 4 L oxygen. Continue high flow oxygen support, wean as able Mobilize safely with assistance Remdesivir, completed 5 days Solu-Medrol, plan 10 days Self proning at the patient can tolerate Okay to continue Breo, Incruse as long as she can deliver adequately.  Alternatively changed to nebs Pulmonary hygiene Will continue to follow in case she needs escalation of her support  CHF Paroxysmal atrial fibrillation on Coumadin anticoagulation as outpatient Continue diuresis as her blood pressure and renal function will tolerate. S Cr stable 1.9 Diltiazem as ordered Following INR, Coumadin dosing per pharmacy   Chronic kidney disease, AKI Follow urine output, BMP Avoid nephrotoxins, renal dose medications  Diabetes Sliding-scale insulin, Levemir 10 units twice daily  Hypothyroidism- last TSH 5, 04/2019 Continue Synthroid  Best practice:  Diet: PO diet Pain/Anxiety/Delirium protocol (if indicated): NA VAP protocol (if indicated): NA DVT prophylaxis: Coumadin on hold GI prophylaxis: PPI Glucose control: see above Mobility: Bed Code Status: Full Family Communication: Per primary Disposition: ICU    Levy Pupa, MD, PhD 02/21/2020, 7:04 AM [facility] [phone] or if no answer [phone]

## 2020-02-21 NOTE — Progress Notes (Signed)
Patient remained in the chair for most of the shift. O2 sats dropped to 80s after transferring from bed to chair. O2 sats increased to >88% in less than 10 minutes.  RN updated son over the phone about plan of care and patient status. All questions were answered at this time.

## 2020-02-21 NOTE — Progress Notes (Signed)
PROGRESS NOTE                                                                                                                                                                                                             Patient Demographics:    Amy Bryant, is a 64 y.o. female, DOB - March 17, 1956, KZS:[id number]  Outpatient Primary MD for the patient is [name], [name], MD   Admit date - 02/25/2020   LOS - 5  CC - SOB     Brief Narrative:  Patient is a 64 y.o. female with PMHx of COPD with chronic hypoxic respiratory failure on 4 L of oxygen at home, chronic diastolic heart failure, PAF on Coumadin, history of PE, CKD stage IIIb, non-insulin-dependent DM-2, morbid obesity-who presented to the hospital with approximately 2-3-day history of worsening shortness of breath.  For approximately 1 week-she has been having nasal congestion/URI symptoms-her PCP called in prednisone and Zithromax which she took without any relief.  In the emergency room-she was noted to have severe hypoxemia-requiring NRB-she was subsequently admitted to the hospitalist service.  Note-has not received the Covid vaccine.  Significant Events: 5/18>> admit to [facility] for severe hypoxemia requiring nonrebreather mask. 5/19>> on heated high flow 5/20>> on heated high flow+ NRB-transfer to MICU  COVID-19 medications: Steroids: 5/18>> Remdesivir: 5/18>> Actemra: 5/18 x 1  Antibiotics: None  Microbiology data: 5/18: Blood culture>> no growth  Significant studies: 5/20: Bilateral lower extremity Doppler>> negative for DVT. 5/19: Echo>> EF 60-65% 5/19: Chest x-ray>> interstitial pulmonary edema/patchy airspace opacity in the left base 5/18: Chest x-ray>> new diffuse interstitial thickening/reticular opacities suspicious for pulmonary edema  DVT prophylaxis: Coumadin-supratherapeutic INR.  Procedures: None  Consults: PCCM    Subjective:   Patient  in bed, appears comfortable, denies any headache, no fever, no chest pain or pressure, improved shortness of breath , no abdominal pain. No focal weakness.    Assessment  & Plan :   Acute on chronic hypoxic Resp Failure due to Covid 19 Viral pneumonia and decompensated diastolic heart failure: She had severe disease and likely presented once her parenchymal damage was quite advanced due to COVID-19 pneumonia and inflammation, received appropriate care with IV steroids, remdesivir and Actemra.  Continue oxygen supplementation and monitor closely.  Remains very tenuous.  She is currently on IV steroid taper along with IV  Lasix as needed dose repeated on 02/21/2020, she has finished remdesivir and Actemra treatment.   Fever: afebrile Prone/Incentive Spirometry: She is unable to prone or lie on her side-have encouraged incentive spirometry/flutter use 5 times an hour  O2 requirements:  SpO2: (!) 86 % O2 Flow Rate (L/min): 35 L/min FiO2 (%): 90 %   COVID-19 Labs: Recent Labs    02/19/20 0715 02/20/20 0607 02/21/20 0105  DDIMER 7.77* 6.11* 3.69*  CRP 1.8* 1.1* 0.7       Component Value Date/Time   BNP 141.2 (H) 02/21/2020 0105    Recent Labs  Lab 03/15/2020 1725 02/20/20 0530 02/21/20 0105  PROCALCITON <0.10 <0.10 <0.10    Lab Results  Component Value Date   SARSCOV2NAA POSITIVE (A) Mar 15, 2020   SARSCOV2NAA NEGATIVE 10/06/2019   SARSCOV2NAA NOT DETECTED 04/08/2019   SARSCOV2NAA NOT DETECTED 04/02/2019    Decompensated acute on chronic diastolic heart failure EF 60%: repeat IV Lasix on 02/21/2020, improvement after diuresis on 02/20/2020 will monitor and adjust as needed.  Elevated D-dimer: On chronic Coumadin, INR in good range and D-dimer now trending down, lower extremity venous duplex unremarkable.  COPD: Not in exacerbation this morning-no wheezing-decrease steroids to twice daily dosing.  Continue bronchodilators.  Normally on 4 L of oxygen at all times at home.  AKI on  CKD stage IIIb: Baseline creatinine around 1.8, monitor cautiously with diuresis, so far creatinine improving with IV Lasix.  PAF with [place] vas 2 score of at least 3: Rate controlled with Cardizem-INR remains supratherapeutic-pharmacy managing Coumadin.  Remote history of pulmonary embolism: INR supratherapeutic-Coumadin on hold-pharmacy following.  Hypothyroidism: Continue Synthroid  Depression/anxiety: Slightly anxious due to acute illness/hypoxia/oxygen requirement-continue as needed Xanax and Prozac.  DM-2 (A1c 5.2 on 10/07/2019): CBGs stable on SSI-follow and adjust.  Recent Labs    02/20/20 1915 02/20/20 2147 02/21/20 0753  GLUCAP 188* 186* 141*   Morbid Obesity: Estimated body mass index is 48.01 kg/m as calculated from the following:   Height as of this encounter: 5' 5.5" (1.664 m).   Weight as of this encounter: 132.9 kg.    GI prophylaxis: PPI  Condition - Extremely Guarded-very tenuous with risk for further deterioration  Family Communication  : Prefers that she update her family-she does not want me to call her mother or her son-she fears that her family will panic   Code Status :  Full Code  Diet :  Diet Order            Diet heart healthy/carb modified Room service appropriate? Yes; Fluid consistency: Thin; Fluid restriction: 1200 mL Fluid  Diet effective now               Disposition Plan  :   Status is: Inpatient  Remains inpatient appropriate because:Inpatient level of care appropriate due to severity of illness   Dispo: The patient is from: Home              Anticipated d/c is to: SNF              Anticipated d/c date is: > 3 days              Patient currently is not medically stable to d/c.  Barriers to discharge: Severe Hypoxia requiring O2 supplementation/complete 5 days of IV Remdesivir  Antimicorbials  :    Anti-infectives (From admission, onward)   Start     Dose/Rate Route Frequency Ordered Stop   02/17/20 1000  remdesivir 100 mg in  sodium  chloride 0.9 % 100 mL IVPB     100 mg 200 mL/hr over 30 Minutes Intravenous Daily 02/12/2020 2202 02/20/20 0955   02/20/2020 2215  remdesivir 200 mg in sodium chloride 0.9% 250 mL IVPB     200 mg 580 mL/hr over 30 Minutes Intravenous Once 02/07/2020 2202 02/21/2020 2319      Inpatient Medications  Scheduled Meds:  vitamin C  500 mg Oral Daily   Chlorhexidine Gluconate Cloth  6 each Topical Daily   cholecalciferol  1,000 Units Oral Daily   diltiazem  120 mg Oral Daily   FLUoxetine  40 mg Oral Daily   fluticasone  1 spray Each Nare Daily   fluticasone furoate-vilanterol  1 puff Inhalation Daily   insulin aspart  0-5 Units Subcutaneous QHS   insulin aspart  0-9 Units Subcutaneous TID WC   insulin detemir  10 Units Subcutaneous BID   levothyroxine  175 mcg Oral Q0600   methylPREDNISolone (SOLU-MEDROL) injection  40 mg Intravenous Q12H   pantoprazole  40 mg Oral Q1200   sodium chloride flush  3 mL Intravenous Q12H   umeclidinium bromide  1 puff Inhalation Daily   warfarin  2 mg Oral ONCE-1600   Warfarin - Pharmacist Dosing Inpatient   Does not apply q1600   zinc sulfate  220 mg Oral Daily   Continuous Infusions:  sodium chloride     PRN Meds:.sodium chloride, acetaminophen, albuterol, ALPRAZolam, chlorpheniramine-HYDROcodone, guaiFENesin-dextromethorphan, lip balm, oxymetazoline, sodium chloride, sodium chloride flush, traMADol   Time Spent in minutes  35    See all Orders from today for further details   Susa Raring M.D on 02/21/2020 at 9:33 AM  To page go to [email or web address] - use universal password  [facility] -  Office  [phone]    Objective:   Vitals:   02/21/20 0600 02/21/20 0700 02/21/20 0751 02/21/20 0800  BP: 123/65 (!) 115/54 (!) 115/54 (!) 123/48  Pulse: (!) 52 60 63 70  Resp: Temp:   (!) 96.9 F (36.1 C)   TempSrc:   Axillary   SpO2: 97% 96% 91% (!) 86%  Weight: 132.9 kg     Height:        Wt Readings  from Last 3 Encounters:  02/21/20 132.9 kg  01/28/20 132 kg  01/06/20 132 kg     Intake/Output Summary (Last 24 hours) at 02/21/2020 0933 Last data filed at 02/21/2020 0800 Gross per 24 hour  Intake 720 ml  Output 1175 ml  Net -455 ml     Physical Exam  Awake Alert, No new F.N deficits, Normal affect [place].AT,PERRAL Supple Neck,No JVD, No cervical lymphadenopathy appriciated.  Symmetrical Chest wall movement, Good air movement bilaterally, few rales RRR,No Gallops, Rubs or new Murmurs, No Parasternal Heave +ve B.Sounds, Abd Soft, No tenderness, No organomegaly appriciated, No rebound - guarding or rigidity. No Cyanosis, Clubbing or edema, No new Rash or bruise    Data Review:    CBC Recent Labs  Lab 02/17/20 0945 02/18/20 0544 02/19/20 0715 02/20/20 0607 02/21/20 0105  WBC 2.7* 5.7 8.7 9.4 12.0*  HGB 9.4* 9.9* 10.8* 10.8* 11.4*  HCT 31.2* 33.0* 36.0 35.3* 36.8  PLT 283 303 323 319 319  MCV 95.7 95.9 95.2 93.6 92.2  MCH 28.8 28.8 28.6 28.6 28.6  MCHC 30.1 30.0 30.0 30.6 31.0  RDW 13.6 13.6 13.5 13.2 13.2  LYMPHSABS 0.3* 0.4* 0.3* 0.3* 0.3*  MONOABS 0.1 0.2 0.2 0.2 0.4  EOSABS 0.0 0.0 0.0  0.0 0.0  BASOSABS 0.0 0.0 0.0 0.0 0.0    Chemistries  Recent Labs  Lab 02/17/20 0945 02/18/20 0544 02/19/20 0715 02/20/20 0530 02/20/20 0607 02/21/20 0105  NA 139 140 137  --  139 139  K 4.2 4.1 3.9  --  4.0 4.3  CL 103 104 102  --  102 103  CO2 --  27 26  GLUCOSE 157* 184* 190*  --  188* 176*  BUN 39* 52* 64*  --  70* 73*  CREATININE 1.79* 2.07* 2.17*  --  1.98* 1.90*  CALCIUM 9.5 9.6 9.6  --  9.6 9.5  MG  --   --   --  2.2  --  2.2  AST 13* 14* 11*  --  9* 12*  ALT --  10 10  ALKPHOS 66 62 66  --  60 61  BILITOT 0.4 0.3 0.9  --  1.0 0.8   ------------------------------------------------------------------------------------------------------------------ No results for input(s): CHOL, HDL, LDLCALC, TRIG, CHOLHDL, LDLDIRECT in the last 72  hours.  Lab Results  Component Value Date   HGBA1C 5.8 (H) 02/17/2020   ------------------------------------------------------------------------------------------------------------------ No results for input(s): TSH, T4TOTAL, T3FREE, THYROIDAB in the last 72 hours.  Invalid input(s): FREET3 ------------------------------------------------------------------------------------------------------------------ No results for input(s): VITAMINB12, FOLATE, FERRITIN, TIBC, IRON, RETICCTPCT in the last 72 hours.  Coagulation profile Recent Labs  Lab 02/17/20 0944 02/18/20 0544 02/19/20 0715 02/20/20 0607 02/21/20 0105  INR 3.9* 4.2* 3.3* 2.4* 2.4*    Recent Labs    02/20/20 0607 02/21/20 0105  DDIMER 6.11* 3.69*    Cardiac Enzymes No results for input(s): CKMB, TROPONINI, MYOGLOBIN in the last 168 hours.  Invalid input(s): CK ------------------------------------------------------------------------------------------------------------------    Component Value Date/Time   BNP 141.2 (H) 02/21/2020 0105    Micro Results Recent Results (from the past 240 hour(s))  SARS CORONAVIRUS 2 (TAT 6-24 HRS) Nasopharyngeal Nasopharyngeal Swab     Status: Abnormal   Collection Time: 03/13/20  5:25 PM   Specimen: Nasopharyngeal Swab  Result Value Ref Range Status   SARS Coronavirus 2 POSITIVE (A) NEGATIVE Final    Comment: RESULT CALLED TO, READ BACK BY AND VERIFIED WITH: RN S GRINDSTAFF  02/17/20 BY S GEZAHEGN (NOTE) SARS-CoV-2 target nucleic acids are DETECTED. The SARS-CoV-2 RNA is generally detectable in upper and lower respiratory specimens during the acute phase of infection. Positive results are indicative of the presence of SARS-CoV-2 RNA. Clinical correlation with patient history and other diagnostic information is  necessary to determine patient infection status. Positive results do not rule out bacterial infection or co-infection with other viruses.  The expected result is  Negative. Fact Sheet for Patients: HairSlick.no Fact Sheet for Healthcare Providers: quierodirigir.com This test is not yet approved or cleared by the [place] FDA and  has been authorized for detection and/or diagnosis of SARS-CoV-2 by FDA under an Emergency Use Authorization (EUA). This EUA will remain  in effect (meaning this test can be used)  for the duration of the COVID-19 declaration under Section 564(b)(1) of the Act, 21 U.S.C. section 360bbb-3(b)(1), unless the authorization is terminated or revoked sooner. Performed at [facility], [address], [place], [place] [address]   Blood Culture (routine x 2)     Status: None (Preliminary result)   Collection Time: 03-13-2020  5:28 PM   Specimen: BLOOD  Result Value Ref Range Status   Specimen Description BLOOD RIGHT ANTECUBITAL  Final   Special Requests  Final    BOTTLES DRAWN AEROBIC AND ANAEROBIC Blood Culture adequate volume   Culture   Final    NO GROWTH 4 DAYS Performed at [facility], [address] [place], [place] [address]    Report Status PENDING  Incomplete  Blood Culture (routine x 2)     Status: None (Preliminary result)   Collection Time: 07-23-20  5:47 PM   Specimen: BLOOD LEFT FOREARM  Result Value Ref Range Status   Specimen Description BLOOD LEFT FOREARM  Final   Special Requests   Final    BOTTLES DRAWN AEROBIC AND ANAEROBIC Blood Culture adequate volume   Culture   Final    NO GROWTH 4 DAYS Performed at [facility], [address] [place], [place] [address]    Report Status PENDING  Incomplete    Radiology Reports DG Chest Port 1 View  Result Date: 02/20/2020 CLINICAL DATA:  Shortness of breath, history of COVID-19 positivity EXAM: PORTABLE CHEST 1 VIEW COMPARISON:  02/17/2020 FINDINGS: Cardiac shadow is stable but enlarged. Aortic calcifications are seen. Patchy opacities are noted primarily in the left base  consistent with the given clinical history. Mild vascular congestion is noted centrally. No other focal abnormality is seen. IMPRESSION: Mild vascular congestion. Persistent opacity in the left base consistent with the given clinical history. Electronically Signed   By: Alcide CleverMark  Lukens M.D.   On: 02/20/2020 08:29   DG Chest Port 1 View  Result Date: 02-04-20 CLINICAL DATA:  Dyspnea, fever. EXAM: PORTABLE CHEST 1 VIEW COMPARISON:  Radiograph 10/06/2019 FINDINGS: Significant interval change from prior exam with diffuse interstitial thickening and reticular opacities. There is also perivascular haziness. Cardiomegaly with unchanged mediastinal contours. Aortic atherosclerosis. Possible small pleural effusions. No visualized pneumothorax. IMPRESSION: New diffuse interstitial thickening and reticular opacities suspicious for pulmonary edema. Similar cardiomegaly. Possible small pleural effusions. Findings suspicious for CHF. Electronically Signed   By: Narda RutherfordMelanie  Sanford M.D.   On: 005-06-21 18:11   DG Chest Port 1V same Day  Result Date: 02/17/2020 CLINICAL DATA:  Shortness of breath EXAM: PORTABLE CHEST 1 VIEW COMPARISON:  Feb 16, 2020 FINDINGS: There is cardiomegaly with pulmonary venous hypertension. There is interstitial edema with patchy airspace opacity in the left base. No adenopathy. There is aortic atherosclerosis. No bone lesions. IMPRESSION: Cardiomegaly with pulmonary vascular congestion. There is interstitial pulmonary edema. Suspect a degree of congestive heart failure. Patchy airspace opacity in the left base is concerning for superimposed pneumonia, although alveolar edema in this area could present in this manner. Both pneumonia and alveolar edema could present concurrently. In comparison with 1 day prior, there appears to be somewhat less interstitial edema. Aortic Atherosclerosis (ICD10-I70.0). Electronically Signed   By: Bretta BangWilliam  Woodruff III M.D.   On: 02/17/2020 08:01   ECHOCARDIOGRAM  COMPLETE  Result Date: 02/17/2020    ECHOCARDIOGRAM REPORT   Patient Name:   Amy Bryant Date of Exam: 02/17/2020 Medical Rec #:  [id number]      Height:       65.5 in Accession #:    [id number]     Weight:       290.3 lb Date of Birth:  06/08/1956      BSA:          2.331 m Patient Age:    63 years       BP:           120/67 mmHg Patient Gender: F  HR:           75 bpm. Exam Location:  Inpatient Procedure: 2D Echo Indications:    CHF 428  History:        Patient has prior history of Echocardiogram examinations, most                 recent 01/09/2019. CHF, COPD, Arrythmias:Atrial Fibrillation;                 Risk Factors:Diabetes and Former Smoker. Covid +. cor pulmonale.                 pulmonary embolism.  Sonographer:    Celene Skeen RDCS (AE) Referring Phys: [id number] John Giovanni  Sonographer Comments: Patient is morbidly obese. Image acquisition challenging due to patient body habitus and Image acquisition challenging due to respiratory motion. limited mobility IMPRESSIONS  1. Left ventricular ejection fraction, by estimation, is 60 to 65%. The left ventricle has normal function. The left ventricle has no regional wall motion abnormalities. Left ventricular diastolic parameters are indeterminate.  2. Right ventricular systolic function was not well visualized. The right ventricular size is not well visualized.  3. Left atrial size was moderately dilated.  4. Right atrial size was mild to moderately dilated.  5. The mitral valve is normal in structure. No evidence of mitral valve regurgitation. No evidence of mitral stenosis.  6. The aortic valve was not well visualized. Aortic valve regurgitation is trivial. Mild to moderate aortic valve sclerosis/calcification is present, without any evidence of aortic stenosis.  7. The inferior vena cava not well seen. FINDINGS  Left Ventricle: Left ventricular ejection fraction, by estimation, is 60 to 65%. The left ventricle has normal function. The left  ventricle has no regional wall motion abnormalities. The left ventricular internal cavity size was normal in size. There is  no left ventricular hypertrophy. Left ventricular diastolic parameters are indeterminate. Right Ventricle: The right ventricular size is not well visualized. Right vetricular wall thickness was not assessed. Right ventricular systolic function was not well visualized. Left Atrium: Left atrial size was moderately dilated. Right Atrium: Right atrial size was mild to moderately dilated. Pericardium: There is no evidence of pericardial effusion. Mitral Valve: The mitral valve is normal in structure. There is mild thickening of the mitral valve leaflet(s). There is mild calcification of the mitral valve leaflet(s). Normal mobility of the mitral valve leaflets. Moderate mitral annular calcification. No evidence of mitral valve regurgitation. No evidence of mitral valve stenosis. Tricuspid Valve: The tricuspid valve is normal in structure. Tricuspid valve regurgitation is mild . No evidence of tricuspid stenosis. Aortic Valve: The aortic valve was not well visualized. Aortic valve regurgitation is trivial. Mild to moderate aortic valve sclerosis/calcification is present, without any evidence of aortic stenosis. Pulmonic Valve: The pulmonic valve was normal in structure. Pulmonic valve regurgitation is not visualized. No evidence of pulmonic stenosis. Aorta: The aortic root is normal in size and structure. Venous: The inferior vena cava not well seen. IAS/Shunts: The interatrial septum was not well visualized.  LEFT VENTRICLE PLAX 2D LVIDd:         5.30 cm LVIDs:         3.40 cm LV PW:         1.10 cm LV IVS:        1.00 cm LVOT diam:     1.80 cm LV SV:         38 LV SV Index:   16 LVOT Area:  2.54 cm  RIGHT VENTRICLE RV S prime:     14.30 cm/s TAPSE (M-mode): 1.9 cm LEFT ATRIUM           Index       RIGHT ATRIUM           Index LA diam:      4.80 cm 2.06 cm/m  RA Area:     17.70 cm LA Vol  (A4C): 44.0 ml 18.88 ml/m RA Volume:   45.20 ml  19.39 ml/m  AORTIC VALVE LVOT Vmax:   83.90 cm/s LVOT Vmean:  59.500 cm/s LVOT VTI:    0.150 m  AORTA Ao Root diam: 3.40 cm MITRAL VALVE MV Area (PHT): 3.48 cm    SHUNTS MV Decel Time: 218 msec    Systemic VTI:  0.15 m MV E velocity: 73.70 cm/s  Systemic Diam: 1.80 cm Charlton Haws MD Electronically signed by Charlton Haws MD Signature Date/Time: 02/17/2020/3:39:01 PM    Final    VAS [place] LOWER EXTREMITY VENOUS (DVT)  Result Date: 02/18/2020  Lower Venous DVTStudy Indications: Swelling, and Edema.  Comparison Study: no prior Performing Technologist: Blanch Media RVS  Examination Guidelines: A complete evaluation includes B-mode imaging, spectral Doppler, color Doppler, and power Doppler as needed of all accessible portions of each vessel. Bilateral testing is considered an integral part of a complete examination. Limited examinations for reoccurring indications may be performed as noted. The reflux portion of the exam is performed with the patient in reverse Trendelenburg.  +---------+---------------+---------+-----------+----------+--------------+  RIGHT     Compressibility Phasicity Spontaneity Properties Thrombus Aging  +---------+---------------+---------+-----------+----------+--------------+  CFV       Full            Yes       Yes                                    +---------+---------------+---------+-----------+----------+--------------+  SFJ       Full                                                             +---------+---------------+---------+-----------+----------+--------------+  FV Prox   Full                                                             +---------+---------------+---------+-----------+----------+--------------+  FV Mid    Full                                                             +---------+---------------+---------+-----------+----------+--------------+  FV Distal Full                                                              +---------+---------------+---------+-----------+----------+--------------+  PFV       Full                                                             +---------+---------------+---------+-----------+----------+--------------+  POP       Full            Yes       Yes                                    +---------+---------------+---------+-----------+----------+--------------+  PTV       Full                                                             +---------+---------------+---------+-----------+----------+--------------+  PERO      Full                                                             +---------+---------------+---------+-----------+----------+--------------+   +---------+---------------+---------+-----------+----------+--------------+  LEFT      Compressibility Phasicity Spontaneity Properties Thrombus Aging  +---------+---------------+---------+-----------+----------+--------------+  CFV       Full            Yes       Yes                                    +---------+---------------+---------+-----------+----------+--------------+  SFJ       Full                                                             +---------+---------------+---------+-----------+----------+--------------+  FV Prox   Full                                                             +---------+---------------+---------+-----------+----------+--------------+  FV Mid    Full                                                             +---------+---------------+---------+-----------+----------+--------------+  FV Distal Full                                                             +---------+---------------+---------+-----------+----------+--------------+  PFV       Full                                                             +---------+---------------+---------+-----------+----------+--------------+  POP       Full            Yes       Yes                                     +---------+---------------+---------+-----------+----------+--------------+  PTV       Full                                                             +---------+---------------+---------+-----------+----------+--------------+  PERO      Full                                                             +---------+---------------+---------+-----------+----------+--------------+     Summary: BILATERAL: - No evidence of deep vein thrombosis seen in the lower extremities, bilaterally. -   *See table(s) above for measurements and observations. Electronically signed by Monica Martinez MD on 02/18/2020 at 5:31:51 PM.    Final

## 2020-02-21 NOTE — Progress Notes (Signed)
ANTICOAGULATION CONSULT NOTE  Pharmacy Consult for Coumadin Indication: history of atrial fibrillation and PE  Allergies  Allergen Reactions  . Pseudoephedrine Other (See Comments)    REACTION: tightness in chest  . Augmentin [Amoxicillin-Pot Clavulanate] Itching and Rash    Did it involve swelling of the face/tongue/throat, SOB, or low BP? No Did it involve sudden or severe rash/hives, skin peeling, or any reaction on the inside of your mouth or nose? Yes Did you need to seek medical attention at a hospital or doctor's office? Yes When did it last happen?2019 If all above answers are "NO", may proceed with cephalosporin use.   . Latex Itching and Rash    Patient Measurements: Height: 5' 5.5" (166.4 cm) Weight: 132.9 kg (292 lb 15.9 oz) IBW/kg (Calculated) : 58.15  Vital Signs: Temp: 97.4 F (36.3 C) (05/23 0318) Temp Source: Axillary (05/23 0318) BP: 115/54 (05/23 0751) Pulse Rate: 63 (05/23 0751)  Labs: Recent Labs    02/19/20 0715 02/19/20 0715 02/20/20 0607 02/21/20 0105  HGB 10.8*   < > 10.8* 11.4*  HCT 36.0  --  35.3* 36.8  PLT 323  --  319 319  LABPROT 32.3*  --  25.3* 25.7*  INR 3.3*  --  2.4* 2.4*  CREATININE 2.17*  --  1.98* 1.90*   < > = values in this interval not displayed.    Estimated Creatinine Clearance: 41.6 mL/min (A) (by C-G formula based on SCr of 1.9 mg/dL (H)).   Assessment: 60 YOF admitted with Covid, to continue Coumadin for history of Afib and PE.  Patient had [facility] clinic visit on 03/12/2020 with INR at 4.4 and instructed to hold Coumadin on 5/18.  Last Coumadin dose on 02/17/20.  INR therapeutic and stable today at 2.4 - requiring lower doses than home regimen.   No bleeding reported.  CBC stable. D-dimer trending down at 3.69. Home Coumadin dose: 2.5mg  daily except 5 mg q Mon  Goal of Therapy:  INR 2-3   Plan:  Coumadin 2mg  PO again today Daily PT / INR  , PharmD, BCPS, BCCCP Clinical Pharmacist Please refer  to [facility] for [facility] numbers 02/21/2020, 8:24 AM

## 2020-02-22 LAB — GLUCOSE, CAPILLARY
Glucose-Capillary: 118 mg/dL — ABNORMAL HIGH (ref 70–99)
Glucose-Capillary: 97 mg/dL (ref 70–99)
Glucose-Capillary: 160 mg/dL — ABNORMAL HIGH (ref 70–99)
Glucose-Capillary: 253 mg/dL — ABNORMAL HIGH (ref 70–99)

## 2020-02-22 LAB — BASIC METABOLIC PANEL
Anion gap: 8 (ref 5–15)
BUN: 77 mg/dL — ABNORMAL HIGH (ref 8–23)
CO2: 25 mmol/L (ref 22–32)
Calcium: 9.1 mg/dL (ref 8.9–10.3)
Chloride: 103 mmol/L (ref 98–111)
Creatinine, Ser: 2.1 mg/dL — ABNORMAL HIGH (ref 0.44–1.00)
GFR calc Af Amer: 28 mL/min — ABNORMAL LOW (ref >60)
GFR calc non Af Amer: 24 mL/min — ABNORMAL LOW (ref >60)
Glucose, Bld: 143 mg/dL — ABNORMAL HIGH (ref 70–99)
Potassium: 4.3 mmol/L (ref 3.5–5.1)
Sodium: 136 mmol/L (ref 135–145)

## 2020-02-22 LAB — CBC
HCT: 35.8 % — ABNORMAL LOW (ref 36.0–46.0)
Hemoglobin: 11.1 g/dL — ABNORMAL LOW (ref 12.0–15.0)
MCH: 28.8 pg (ref 26.0–34.0)
MCHC: 31 g/dL (ref 30.0–36.0)
MCV: 93 fL (ref 80.0–100.0)
Platelets: 305 10*3/uL (ref 150–400)
RBC: 3.85 MIL/uL — ABNORMAL LOW (ref 3.87–5.11)
RDW: 13.3 % (ref 11.5–15.5)
WBC: 11.9 10*3/uL — ABNORMAL HIGH (ref 4.0–10.5)
nRBC: 0 % (ref 0.0–0.2)

## 2020-02-22 LAB — BRAIN NATRIURETIC PEPTIDE: B Natriuretic Peptide: 102.3 pg/mL — ABNORMAL HIGH (ref 0.0–100.0)

## 2020-02-22 LAB — PROTIME-INR
INR: 2.3 — ABNORMAL HIGH (ref 0.8–1.2)
Prothrombin Time: 24.9 seconds — ABNORMAL HIGH (ref 11.4–15.2)

## 2020-02-22 LAB — PROCALCITONIN: Procalcitonin: <0.1 ng/mL

## 2020-02-22 LAB — MAGNESIUM: Magnesium: 2.2 mg/dL (ref 1.7–2.4)

## 2020-02-22 MED ORDER — WARFARIN SODIUM 3 MG PO TABS
3.0000 mg | ORAL_TABLET | Freq: Once | ORAL | Status: AC
  Administered 2020-02-22: 3 mg via ORAL
  Filled 2020-02-22: qty 1

## 2020-02-22 NOTE — Progress Notes (Signed)
Occupational Therapy Treatment Patient Details Name: Amy Bryant MRN: [id number] DOB: 1956/09/23 Today's Date: 02/22/2020    History of present illness 64 y.o. female admitted on 02/09/2020 for SOB.  Dx with COVID 19 PNA and decompensated diastolic heart failure. Pt with significant PMH of DM2, depression/anxiety, PE, PAF, CKD III, COPD (on 4 L O2 [place] at baseline), morbid obestiy, CHF, cellulitis.    OT comments  Focus of session on UE exercises with level 2 and 3 theraband elbows and horizontal ABD and A/AROM B FF. Pt desaturates to 85% on HFNC requiring rest breaks between sets. Pt to continue UE exericses between therapy session.   Follow Up Recommendations  Home health OT    Equipment Recommendations  None recommended by OT    Recommendations for Other Services      Precautions / Restrictions Precautions Precautions: Fall Precaution Comments: desats with minimal exertion       Mobility Bed Mobility Overal bed mobility: Needs Assistance Bed Mobility: Supine to Sit;Sit to Supine     Supine to sit: Supervision Sit to supine: Supervision   General bed mobility comments: +use of bed rail, increased time and effort. Needing to sit and catch breath at EOB  Transfers                      Balance Overall balance assessment: Mild deficits observed, not formally tested                                         ADL either performed or assessed with clinical judgement   ADL                                               Vision       Perception     Praxis      Cognition Arousal/Alertness: Awake/alert Behavior During Therapy: WFL for tasks assessed/performed Overall Cognitive Status: Within Functional Limits for tasks assessed                                          Exercises Exercises: General Upper Extremity General Exercises - Upper Extremity Shoulder Flexion: Both;10 reps;Seated;AROM;AAROM Shoulder  Horizontal ABduction: Strengthening;Both;10 reps;Seated;Theraband Theraband Level (Shoulder Horizontal Abduction): Level 2 (Red) Elbow Flexion: Strengthening;Both;10 reps;Seated;Theraband Theraband Level (Elbow Flexion): Level 3 (Green) Elbow Extension: Strengthening;Both;10 reps;Seated;Theraband Theraband Level (Elbow Extension): Level 3 (Green)   Shoulder Instructions       General Comments      Pertinent Vitals/ Pain       Pain Assessment: No/denies pain  Home Living                                          Prior Functioning/Environment              Frequency  Min 2X/week        Progress Toward Goals  OT Goals(current goals can now be found in the care plan section)  Progress towards OT goals: Progressing toward goals  Acute Rehab OT Goals Patient Stated Goal: to go  home and see her grandchildren again OT Goal Formulation: With patient Time For Goal Achievement: 03/04/20 Potential to Achieve Goals: Good  Plan Discharge plan remains appropriate    Co-evaluation                 AM-PAC OT "6 Clicks" Daily Activity     Outcome Measure   Help from another person eating meals?: None Help from another person taking care of personal grooming?: None Help from another person toileting, which includes using toliet, bedpan, or urinal?: A Little Help from another person bathing (including washing, rinsing, drying)?: A Little Help from another person to put on and taking off regular upper body clothing?: None Help from another person to put on and taking off regular lower body clothing?: A Little 6 Click Score: 21    End of Session Equipment Utilized During Treatment: Oxygen(heated HFNC)  OT Visit Diagnosis: Unsteadiness on feet (R26.81);Other abnormalities of gait and mobility (R26.89)   Activity Tolerance Patient tolerated treatment well   Patient Left in bed;with call bell/phone within reach   Nurse Communication          Time:  1610-9604 OT Time Calculation (min): 17 min  Charges: OT General Charges $OT Visit: 1 Visit OT Treatments $Therapeutic Exercise: 8-22 mins  Martie Round, OTR/L Acute Rehabilitation Services Pager: [phone] Office: [phone]   Evern Bio 02/22/2020, 12:32 PM

## 2020-02-22 NOTE — Progress Notes (Addendum)
PROGRESS NOTE                                                                                                                                                                                                             Patient Demographics:    Amy Bryant, is a 64 y.o. female, DOB - 05-May-1956, CHY:[id number]  Outpatient Primary MD for the patient is [name], [name], MD   Admit date - 02-27-2020   LOS - 6  CC - SOB     Brief Narrative:  Patient is a 64 y.o. female with PMHx of COPD with chronic hypoxic respiratory failure on 4 L of oxygen at home, chronic diastolic heart failure, PAF on Coumadin, history of PE, CKD stage IIIb, non-insulin-dependent DM-2, morbid obesity-who presented to the hospital with approximately 2-3-day history of worsening shortness of breath.  For approximately 1 week-she has been having nasal congestion/URI symptoms-her PCP called in prednisone and Zithromax which she took without any relief.  In the emergency room-she was noted to have severe hypoxemia-requiring NRB-she was subsequently admitted to the hospitalist service.  Note-has not received the Covid vaccine.  Significant Events: 5/18>> admit to [facility] for severe hypoxemia requiring nonrebreather mask. 5/19>> on heated high flow 5/20>> on heated high flow+ NRB-transfer to MICU  COVID-19 medications: Steroids: 5/18>> Remdesivir: 5/18>>5/22 Actemra: 5/18 x 1  Antibiotics: None  Microbiology data: 5/18: Blood culture>> no growth  Significant studies: 5/20: Bilateral lower extremity Doppler>> negative for DVT. 5/19: Echo>> EF 60-65% 5/19: Chest x-ray>> interstitial pulmonary edema/patchy airspace opacity in the left base 5/18: Chest x-ray>> new diffuse interstitial thickening/reticular opacities suspicious for pulmonary edema  DVT prophylaxis: Coumadin-supratherapeutic INR.  Procedures: None  Consults: PCCM    Subjective:    Patient in bed, appears comfortable, denies any headache, no fever, no chest pain or pressure, gradually improving shortness of breath , no abdominal pain. No focal weakness.    Assessment  & Plan :   Acute on chronic hypoxic Resp Failure due to Covid 19 Viral pneumonia and decompensated diastolic heart failure: She had severe disease and likely presented once her parenchymal damage was quite advanced due to COVID-19 pneumonia and inflammation, received appropriate care with IV steroids, remdesivir and Actemra.  She is currently on IV steroid taper along with IV Lasix as needed dose repeated on 02/21/2020, she has finished  remdesivir and Actemra treatment.  He is finally feeling better and clinically improving although still on heated high flow around 40 L with 100% FiO2 but there is gradual improvement, she was wearing nonrebreather mask on top of her height and high flow which is off at this time.  We will continue to monitor and adjust as needed.  No Lasix on 02/22/2020.   Fever: afebrile  Prone/Incentive Spirometry: She is unable to prone or lie on her side-have encouraged incentive spirometry/flutter use 5 times an hour  O2 requirements:  SpO2: 93 % O2 Flow Rate (L/min): 40 L/min FiO2 (%): 90 %   COVID-19 Labs: Recent Labs    02/20/20 0607 02/21/20 0105  DDIMER 6.11* 3.69*  CRP 1.1* 0.7       Component Value Date/Time   BNP 102.3 (H) 02/22/2020 0656    Recent Labs  Lab Feb 17, 2020 1725 02/20/20 0530 02/21/20 0105 02/22/20 0656  PROCALCITON <0.10 <0.10 <0.10 <0.10    Lab Results  Component Value Date   SARSCOV2NAA POSITIVE (A) 02-17-20   SARSCOV2NAA NEGATIVE 10/06/2019   SARSCOV2NAA NOT DETECTED 04/08/2019   SARSCOV2NAA NOT DETECTED 04/02/2019    Decompensated acute on chronic diastolic heart failure EF 60%: Has been diuresed with IV Lasix now lungs appear clear hold off further diuresis.  Elevated D-dimer: On chronic Coumadin, INR in good range and D-dimer now  trending down, lower extremity venous duplex unremarkable.  COPD: Not in exacerbation this morning-no wheezing-decrease steroids to twice daily dosing.  Continue bronchodilators.  Normally on 4 L of oxygen at all times at home.  AKI on CKD stage IIIb: Baseline creatinine around 1.8, monitor cautiously with diuresis, [place] close to baseline.  PAF with [place] vas 2 score of at least 3: Rate controlled with Cardizem-INR remains supratherapeutic-pharmacy managing Coumadin.  Cardizem dose adjusted further for better rate control.  Remote history of pulmonary embolism: on Coumadin pharmacy following.  Hypothyroidism: Continue Synthroid  Depression/anxiety: Slightly anxious due to acute illness/hypoxia/oxygen requirement-continue as needed Xanax and Prozac.  DM-2 (A1c 5.2 on 10/07/2019): CBGs stable on SSI-follow and adjust.  Recent Labs    02/21/20 1655 02/21/20 2112 02/22/20 0825  GLUCAP 222* 269* 118*   Morbid Obesity: Estimated body mass index is 49.64 kg/m as calculated from the following:   Height as of this encounter: 5' 5.5" (1.664 m).   Weight as of this encounter: 137.4 kg.    GI prophylaxis: PPI  Condition - Extremely Guarded-very tenuous with risk for further deterioration  Family Communication  : Prefers that she update her family-she does not want me to call her mother or her son-she fears that her family will panic   Code Status :  Full Code  Diet :  Diet Order            Diet heart healthy/carb modified Room service appropriate? Yes; Fluid consistency: Thin; Fluid restriction: 1200 mL Fluid  Diet effective now               Disposition Plan  :   Status is: Inpatient  Remains inpatient appropriate because:Inpatient level of care appropriate due to severity of illness   Dispo: The patient is from: Home              Anticipated d/c is to: SNF              Anticipated d/c date is: > 3 days              Patient currently is not  medically stable to  d/c.  Barriers to discharge: Severe Hypoxia requiring O2 supplementation/complete 5 days of IV Remdesivir  Antimicorbials  :    Anti-infectives (From admission, onward)   Start     Dose/Rate Route Frequency Ordered Stop   02/17/20 1000  remdesivir 100 mg in sodium chloride 0.9 % 100 mL IVPB     100 mg 200 mL/hr over 30 Minutes Intravenous Daily 2020/03/14 2202 02/20/20 0955   03-14-2020 2215  remdesivir 200 mg in sodium chloride 0.9% 250 mL IVPB     200 mg 580 mL/hr over 30 Minutes Intravenous Once 03-14-2020 2202 Mar 14, 2020 2319      Inpatient Medications  Scheduled Meds: . vitamin C  500 mg Oral Daily  . Chlorhexidine Gluconate Cloth  6 each Topical Daily  . cholecalciferol  1,000 Units Oral Daily  . diltiazem  240 mg Oral Daily  . FLUoxetine  40 mg Oral Daily  . fluticasone  1 spray Each Nare Daily  . fluticasone furoate-vilanterol  1 puff Inhalation Daily  . insulin aspart  0-5 Units Subcutaneous QHS  . insulin aspart  0-9 Units Subcutaneous TID WC  . insulin detemir  10 Units Subcutaneous BID  . levothyroxine  175 mcg Oral Q0600  . methylPREDNISolone (SOLU-MEDROL) injection  40 mg Intravenous Daily  . pantoprazole  40 mg Oral Q1200  . umeclidinium bromide  1 puff Inhalation Daily  . warfarin  3 mg Oral ONCE-1600  . Warfarin - Pharmacist Dosing Inpatient   Does not apply q1600  . zinc sulfate  220 mg Oral Daily   Continuous Infusions:  PRN Meds:.acetaminophen, albuterol, ALPRAZolam, chlorpheniramine-HYDROcodone, diltiazem, guaiFENesin-dextromethorphan, lip balm, oxymetazoline, sodium chloride, traMADol   Time Spent in minutes  35    See all Orders from today for further details   Susa Raring M.D on 02/22/2020 at 9:42 AM  To page go to [email or web address] - use universal password  [facility] -  Office  [phone]    Objective:   Vitals:   02/22/20 0802 02/22/20 0828 02/22/20 0835 02/22/20 0910  BP: 121/70   101/87  Pulse: 62  (!) 59 73  Resp: 19  16 (!)  21  Temp:  97.6 F (36.4 C)    TempSrc:  Oral    SpO2: 94%  (!) 80% 93%  Weight:      Height:        Wt Readings from Last 3 Encounters:  02/22/20 (!) 137.4 kg  01/28/20 132 kg  01/06/20 132 kg     Intake/Output Summary (Last 24 hours) at 02/22/2020 0942 Last data filed at 02/22/2020 0600 Gross per 24 hour  Intake 360 ml  Output 1400 ml  Net -1040 ml     Physical Exam  Awake Alert, No new F.N deficits, Normal affect [place].AT,PERRAL Supple Neck,No JVD, No cervical lymphadenopathy appriciated.  Symmetrical Chest wall movement, Good air movement bilaterally, CTAB RRR,No Gallops, Rubs or new Murmurs, No Parasternal Heave +ve B.Sounds, Abd Soft, No tenderness, No organomegaly appriciated, No rebound - guarding or rigidity. No Cyanosis, Clubbing or edema, No new Rash or bruise    Data Review:    CBC Recent Labs  Lab 02/17/20 0945 02/17/20 0945 02/18/20 0544 02/19/20 0715 02/20/20 0607 02/21/20 0105 02/22/20 0806  WBC 2.7*   < > 5.7 8.7 9.4 12.0* 11.9*  HGB 9.4*   < > 9.9* 10.8* 10.8* 11.4* 11.1*  HCT 31.2*   < > 33.0* 36.0 35.3* 36.8 35.8*  PLT 283   < >  303 323 319 319 305  MCV 95.7   < > 95.9 95.2 93.6 92.2 93.0  MCH 28.8   < > 28.8 28.6 28.6 28.6 28.8  MCHC 30.1   < > 30.0 30.0 30.6 31.0 31.0  RDW 13.6   < > 13.6 13.5 13.2 13.2 13.3  LYMPHSABS 0.3*  --  0.4* 0.3* 0.3* 0.3*  --   MONOABS 0.1  --  0.2 0.2 0.2 0.4  --   EOSABS 0.0  --  0.0 0.0 0.0 0.0  --   BASOSABS 0.0  --  0.0 0.0 0.0 0.0  --    < > = values in this interval not displayed.    Chemistries  Recent Labs  Lab 02/17/20 0945 02/17/20 0945 02/18/20 0544 02/19/20 0715 02/20/20 0530 02/20/20 0607 02/21/20 0105 02/22/20 0656 02/22/20 0806  NA 139   < > 140 137  --  139 139  --  136  K 4.2   < > 4.1 3.9  --  4.0 4.3  --  4.3  CL 103   < > 104 102  --  102 103  --  103  CO2 24   < > 27 26  --  27 26  --  25  GLUCOSE 157*   < > 184* 190*  --  188* 176*  --  143*  BUN 39*   < > 52* 64*  --   70* 73*  --  77*  CREATININE 1.79*   < > 2.07* 2.17*  --  1.98* 1.90*  --  2.10*  CALCIUM 9.5   < > 9.6 9.6  --  9.6 9.5  --  9.1  MG  --   --   --   --  2.2  --  2.2 2.2  --   AST 13*  --  14* 11*  --  9* 12*  --   --   ALT 11  --  9 11  --  10 10  --   --   ALKPHOS 66  --  62 66  --  60 61  --   --   BILITOT 0.4  --  0.3 0.9  --  1.0 0.8  --   --    < > = values in this interval not displayed.   ------------------------------------------------------------------------------------------------------------------ No results for input(s): CHOL, HDL, LDLCALC, TRIG, CHOLHDL, LDLDIRECT in the last 72 hours.  Lab Results  Component Value Date   HGBA1C 5.8 (H) 02/17/2020   ------------------------------------------------------------------------------------------------------------------ No results for input(s): TSH, T4TOTAL, T3FREE, THYROIDAB in the last 72 hours.  Invalid input(s): FREET3 ------------------------------------------------------------------------------------------------------------------ No results for input(s): VITAMINB12, FOLATE, FERRITIN, TIBC, IRON, RETICCTPCT in the last 72 hours.  Coagulation profile Recent Labs  Lab 02/18/20 0544 02/19/20 0715 02/20/20 0607 02/21/20 0105 02/22/20 0656  INR 4.2* 3.3* 2.4* 2.4* 2.3*    Recent Labs    02/20/20 0607 02/21/20 0105  DDIMER 6.11* 3.69*    Cardiac Enzymes No results for input(s): CKMB, TROPONINI, MYOGLOBIN in the last 168 hours.  Invalid input(s): CK ------------------------------------------------------------------------------------------------------------------    Component Value Date/Time   BNP 102.3 (H) 02/22/2020 6004    Micro Results Recent Results (from the past 240 hour(s))  SARS CORONAVIRUS 2 (TAT 6-24 HRS) Nasopharyngeal Nasopharyngeal Swab     Status: Abnormal   Collection Time: 02/28/2020  5:25 PM   Specimen: Nasopharyngeal Swab  Result Value Ref Range Status   SARS Coronavirus 2 POSITIVE (A)  NEGATIVE Final  Comment: RESULT CALLED TO, READ BACK BY AND VERIFIED WITH: RN S GRINDSTAFF @0028  02/17/20 BY S GEZAHEGN (NOTE) SARS-CoV-2 target nucleic acids are DETECTED. The SARS-CoV-2 RNA is generally detectable in upper and lower respiratory specimens during the acute phase of infection. Positive results are indicative of the presence of SARS-CoV-2 RNA. Clinical correlation with patient history and other diagnostic information is  necessary to determine patient infection status. Positive results do not rule out bacterial infection or co-infection with other viruses.  The expected result is Negative. Fact Sheet for Patients: HairSlick.nohttps://www.fda.gov/media/138098/download Fact Sheet for Healthcare Providers: quierodirigir.comhttps://www.fda.gov/media/138095/download This test is not yet approved or cleared by the [place] FDA and  has been authorized for detection and/or diagnosis of SARS-CoV-2 by FDA under an Emergency Use Authorization (EUA). This EUA will remain  in effect (meaning this test can be used)  for the duration of the COVID-19 declaration under Section 564(b)(1) of the Act, 21 U.S.C. section 360bbb-3(b)(1), unless the authorization is terminated or revoked sooner. Performed at [facility], [address] [place], [place] [address]   Blood Culture (routine x 2)     Status: None   Collection Time: 02/23/2020  5:28 PM   Specimen: BLOOD  Result Value Ref Range Status   Specimen Description BLOOD RIGHT ANTECUBITAL  Final   Special Requests   Final    BOTTLES DRAWN AEROBIC AND ANAEROBIC Blood Culture adequate volume   Culture   Final    NO GROWTH 5 DAYS Performed at [facility], [address] [place], [place] [address]    Report Status 02/21/2020 FINAL  Final  Blood Culture (routine x 2)     Status: None   Collection Time: 02/08/2020  5:47 PM   Specimen: BLOOD LEFT FOREARM  Result Value Ref Range Status   Specimen Description BLOOD LEFT FOREARM  Final   Special  Requests   Final    BOTTLES DRAWN AEROBIC AND ANAEROBIC Blood Culture adequate volume   Culture   Final    NO GROWTH 5 DAYS Performed at [facility], [address] [place], [place] [address]    Report Status 02/21/2020 FINAL  Final    Radiology Reports DG Chest Port 1 View  Result Date: 02/20/2020 CLINICAL DATA:  Shortness of breath, history of COVID-19 positivity EXAM: PORTABLE CHEST 1 VIEW COMPARISON:  02/17/2020 FINDINGS: Cardiac shadow is stable but enlarged. Aortic calcifications are seen. Patchy opacities are noted primarily in the left base consistent with the given clinical history. Mild vascular congestion is noted centrally. No other focal abnormality is seen. IMPRESSION: Mild vascular congestion. Persistent opacity in the left base consistent with the given clinical history. Electronically Signed   By: Alcide CleverMark  Lukens M.D.   On: 02/20/2020 08:29   DG Chest Port 1 View  Result Date: 02/04/2020 CLINICAL DATA:  Dyspnea, fever. EXAM: PORTABLE CHEST 1 VIEW COMPARISON:  Radiograph 10/06/2019 FINDINGS: Significant interval change from prior exam with diffuse interstitial thickening and reticular opacities. There is also perivascular haziness. Cardiomegaly with unchanged mediastinal contours. Aortic atherosclerosis. Possible small pleural effusions. No visualized pneumothorax. IMPRESSION: New diffuse interstitial thickening and reticular opacities suspicious for pulmonary edema. Similar cardiomegaly. Possible small pleural effusions. Findings suspicious for CHF. Electronically Signed   By: Narda RutherfordMelanie  Sanford M.D.   On: 02/09/2020 18:11   DG Chest Port 1V same Day  Result Date: 02/17/2020 CLINICAL DATA:  Shortness of breath EXAM: PORTABLE CHEST 1 VIEW COMPARISON:  Feb 16, 2020 FINDINGS: There is cardiomegaly  with pulmonary venous hypertension. There is interstitial edema with patchy airspace opacity in the left base. No adenopathy. There is aortic atherosclerosis. No bone lesions.  IMPRESSION: Cardiomegaly with pulmonary vascular congestion. There is interstitial pulmonary edema. Suspect a degree of congestive heart failure. Patchy airspace opacity in the left base is concerning for superimposed pneumonia, although alveolar edema in this area could present in this manner. Both pneumonia and alveolar edema could present concurrently. In comparison with 1 day prior, there appears to be somewhat less interstitial edema. Aortic Atherosclerosis (ICD10-I70.0). Electronically Signed   By: Bretta Bang III M.D.   On: 02/17/2020 08:01   ECHOCARDIOGRAM COMPLETE  Result Date: 02/17/2020    ECHOCARDIOGRAM REPORT   Patient Name:   ALAYAH KNOUFF Date of Exam: 02/17/2020 Medical Rec #:  [id number]      Height:       65.5 in Accession #:    [id number]     Weight:       290.3 lb Date of Birth:  [birth year]      BSA:          2.331 m Patient Age:    63 years       BP:           120/67 mmHg Patient Gender: F              HR:           75 bpm. Exam Location:  Inpatient Procedure: 2D Echo Indications:    CHF 428  History:        Patient has prior history of Echocardiogram examinations, most                 recent 01/09/2019. CHF, COPD, Arrythmias:Atrial Fibrillation;                 Risk Factors:Diabetes and Former Smoker. Covid +. cor pulmonale.                 pulmonary embolism.  Sonographer:    Celene Skeen RDCS (AE) Referring Phys: [id number] John Giovanni  Sonographer Comments: Patient is morbidly obese. Image acquisition challenging due to patient body habitus and Image acquisition challenging due to respiratory motion. limited mobility IMPRESSIONS  1. Left ventricular ejection fraction, by estimation, is 60 to 65%. The left ventricle has normal function. The left ventricle has no regional wall motion abnormalities. Left ventricular diastolic parameters are indeterminate.  2. Right ventricular systolic function was not well visualized. The right ventricular size is not well visualized.  3. Left  atrial size was moderately dilated.  4. Right atrial size was mild to moderately dilated.  5. The mitral valve is normal in structure. No evidence of mitral valve regurgitation. No evidence of mitral stenosis.  6. The aortic valve was not well visualized. Aortic valve regurgitation is trivial. Mild to moderate aortic valve sclerosis/calcification is present, without any evidence of aortic stenosis.  7. The inferior vena cava not well seen. FINDINGS  Left Ventricle: Left ventricular ejection fraction, by estimation, is 60 to 65%. The left ventricle has normal function. The left ventricle has no regional wall motion abnormalities. The left ventricular internal cavity size was normal in size. There is  no left ventricular hypertrophy. Left ventricular diastolic parameters are indeterminate. Right Ventricle: The right ventricular size is not well visualized. Right vetricular wall thickness was not assessed. Right ventricular systolic function was not well visualized. Left Atrium: Left atrial size was moderately dilated. Right Atrium: Right atrial size  was mild to moderately dilated. Pericardium: There is no evidence of pericardial effusion. Mitral Valve: The mitral valve is normal in structure. There is mild thickening of the mitral valve leaflet(s). There is mild calcification of the mitral valve leaflet(s). Normal mobility of the mitral valve leaflets. Moderate mitral annular calcification. No evidence of mitral valve regurgitation. No evidence of mitral valve stenosis. Tricuspid Valve: The tricuspid valve is normal in structure. Tricuspid valve regurgitation is mild . No evidence of tricuspid stenosis. Aortic Valve: The aortic valve was not well visualized. Aortic valve regurgitation is trivial. Mild to moderate aortic valve sclerosis/calcification is present, without any evidence of aortic stenosis. Pulmonic Valve: The pulmonic valve was normal in structure. Pulmonic valve regurgitation is not visualized. No  evidence of pulmonic stenosis. Aorta: The aortic root is normal in size and structure. Venous: The inferior vena cava not well seen. IAS/Shunts: The interatrial septum was not well visualized.  LEFT VENTRICLE PLAX 2D LVIDd:         5.30 cm LVIDs:         3.40 cm LV PW:         1.10 cm LV IVS:        1.00 cm LVOT diam:     1.80 cm LV SV:         38 LV SV Index:   16 LVOT Area:     2.54 cm  RIGHT VENTRICLE RV S prime:     14.30 cm/s TAPSE (M-mode): 1.9 cm LEFT ATRIUM           Index       RIGHT ATRIUM           Index LA diam:      4.80 cm 2.06 cm/m  RA Area:     17.70 cm LA Vol (A4C): 44.0 ml 18.88 ml/m RA Volume:   45.20 ml  19.39 ml/m  AORTIC VALVE LVOT Vmax:   83.90 cm/s LVOT Vmean:  59.500 cm/s LVOT VTI:    0.150 m  AORTA Ao Root diam: 3.40 cm MITRAL VALVE MV Area (PHT): 3.48 cm    SHUNTS MV Decel Time: 218 msec    Systemic VTI:  0.15 m MV E velocity: 73.70 cm/s  Systemic Diam: 1.80 cm Charlton Haws MD Electronically signed by Charlton Haws MD Signature Date/Time: 02/17/2020/3:39:01 PM    Final    VAS [place] LOWER EXTREMITY VENOUS (DVT)  Result Date: 02/18/2020  Lower Venous DVTStudy Indications: Swelling, and Edema.  Comparison Study: no prior Performing Technologist: Blanch Media RVS  Examination Guidelines: A complete evaluation includes B-mode imaging, spectral Doppler, color Doppler, and power Doppler as needed of all accessible portions of each vessel. Bilateral testing is considered an integral part of a complete examination. Limited examinations for reoccurring indications may be performed as noted. The reflux portion of the exam is performed with the patient in reverse Trendelenburg.  +---------+---------------+---------+-----------+----------+--------------+ RIGHT    CompressibilityPhasicitySpontaneityPropertiesThrombus Aging +---------+---------------+---------+-----------+----------+--------------+ CFV      Full           Yes      Yes                                  +---------+---------------+---------+-----------+----------+--------------+ SFJ      Full                                                        +---------+---------------+---------+-----------+----------+--------------+  FV Prox  Full                                                        +---------+---------------+---------+-----------+----------+--------------+ FV Mid   Full                                                        +---------+---------------+---------+-----------+----------+--------------+ FV DistalFull                                                        +---------+---------------+---------+-----------+----------+--------------+ PFV      Full                                                        +---------+---------------+---------+-----------+----------+--------------+ POP      Full           Yes      Yes                                 +---------+---------------+---------+-----------+----------+--------------+ PTV      Full                                                        +---------+---------------+---------+-----------+----------+--------------+ PERO     Full                                                        +---------+---------------+---------+-----------+----------+--------------+   +---------+---------------+---------+-----------+----------+--------------+ LEFT     CompressibilityPhasicitySpontaneityPropertiesThrombus Aging +---------+---------------+---------+-----------+----------+--------------+ CFV      Full           Yes      Yes                                 +---------+---------------+---------+-----------+----------+--------------+ SFJ      Full                                                        +---------+---------------+---------+-----------+----------+--------------+ FV Prox  Full                                                         +---------+---------------+---------+-----------+----------+--------------+  FV Mid   Full                                                        +---------+---------------+---------+-----------+----------+--------------+ FV DistalFull                                                        +---------+---------------+---------+-----------+----------+--------------+ PFV      Full                                                        +---------+---------------+---------+-----------+----------+--------------+ POP      Full           Yes      Yes                                 +---------+---------------+---------+-----------+----------+--------------+ PTV      Full                                                        +---------+---------------+---------+-----------+----------+--------------+ PERO     Full                                                        +---------+---------------+---------+-----------+----------+--------------+     Summary: BILATERAL: - No evidence of deep vein thrombosis seen in the lower extremities, bilaterally. -   *See table(s) above for measurements and observations. Electronically signed by Sherald Hess MD on 02/18/2020 at 5:31:51 PM.    Final

## 2020-02-22 NOTE — Progress Notes (Signed)
Patient weaned off NRB mask by Dr. Thedore Mins.  HFNC changed to 40L, 100%. Will titrate as tolerated.  NRB mask at bedside if needed.

## 2020-02-22 NOTE — Progress Notes (Signed)
ANTICOAGULATION CONSULT NOTE  Pharmacy Consult for Coumadin Indication: history of atrial fibrillation and PE  Allergies  Allergen Reactions  . Pseudoephedrine Other (See Comments)    REACTION: tightness in chest  . Augmentin [Amoxicillin-Pot Clavulanate] Itching and Rash    Did it involve swelling of the face/tongue/throat, SOB, or low BP? No Did it involve sudden or severe rash/hives, skin peeling, or any reaction on the inside of your mouth or nose? Yes Did you need to seek medical attention at a hospital or doctor's office? Yes When did it last happen?2019 If all above answers are "NO", may proceed with cephalosporin use.   . Latex Itching and Rash    Patient Measurements: Height: 5' 5.5" (166.4 cm) Weight: (!) 137.4 kg (302 lb 14.6 oz) IBW/kg (Calculated) : 58.15  Vital Signs: Temp: 97.1 F (36.2 C) (05/24 0324) Temp Source: Axillary (05/24 0324) BP: 126/66 (05/24 0700) Pulse Rate: 66 (05/24 0700)  Labs: Recent Labs    02/20/20 0607 02/21/20 0105 02/22/20 0656  HGB 10.8* 11.4*  --   HCT 35.3* 36.8  --   PLT 319 319  --   LABPROT 25.3* 25.7* 24.9*  INR 2.4* 2.4* 2.3*  CREATININE 1.98* 1.90*  --     Estimated Creatinine Clearance: 42.5 mL/min (A) (by C-G formula based on SCr of 1.9 mg/dL (H)).   Assessment: 49 YOF admitted with Covid, to continue Coumadin for history of Afib and PE.  Patient had [facility] clinic visit on 02/23/2020 with INR at 4.4 and instructed to hold Coumadin on 5/18.  Last Coumadin dose on 02/17/20.  INR therapeutic at 2.3 - requiring lower doses than home regimen.  No bleeding reported.  Home Coumadin dose: 2.5mg  daily except 5mg  on Mon  Goal of Therapy:  INR 2-3   Plan:   Coumadin 3mg  PO today Daily PT / INR  Thuy D. , PharmD, BCPS, BCCCP 02/22/2020, 7:43 AM

## 2020-02-22 NOTE — Progress Notes (Signed)
   NAME:  Amy Bryant, MRN:  [id number], DOB:  1956/04/12, LOS: 6 ADMISSION DATE:  02/15/2020, CONSULTATION DATE: 02/18/2020 REFERRING MD:  Jeoffrey Massed, CHIEF COMPLAINT: COVID-19 pneumonia  Brief History   64 year old with past medical history including COPD, CKD, chronic respiratory failure presenting with COVID-19 pneumonia on 5/18. Treated with steroids, remdesivir and Actemra. PCCM consulted for worsening hypoxia  Past Medical History  COPD with chronic hypoxic respiratory failure requiring 4 L [place] supplemental oxygen at all times Chronic diastolic congestive heart failure PAF on Coumadin Prior history of PE CKD stage IIIb Noninsulin-dependent type 2 diabetes Morbid obesity Reactive airway dysfunction  Osteoarthritis  Hypothyroidism GERD  Significant Hospital Events   5/19 Adit 5/20 Transfer to ICU 5/24 Remains on HHF and NRB  Consults:  PCCM  Procedures:    Significant Diagnostic Tests:  Chest x-ray 5/20-cardiomegaly, patchy airspace disease, vascular congestion  Echo 5/19-LVEF 60 to 65%, moderate LA, RA dilation  Micro Data:    Antimicrobials/COVID Rx  Solumedrol5/18>> Remdesivir:5/18>> 5/23 Actemra: 5/18 x 1  Interim history/subjective:   Continues to have high O2 requirements.  Overall states that she is feeling better.  Objective   Blood pressure 129/75, pulse 65, temperature 97.6 F (36.4 C), temperature source Oral, resp. rate 17, height 5' 5.5" (1.664 m), weight (!) 137.4 kg, SpO2 94 %.    FiO2 (%):  [60 %-100 %] 90 %   Intake/Output Summary (Last 24 hours) at 02/22/2020 1138 Last data filed at 02/22/2020 0600 Gross per 24 hour  Intake 120 ml  Output 1400 ml  Net -1280 ml   Filed Weights   02/20/20 0500 02/21/20 0600 02/22/20 0500  Weight: 131.6 kg 132.9 kg (!) 137.4 kg    Examination: Gen:      No acute distress, obese, pleasant HEENT:  EOMI, sclera anicteric Neck:     No masses; no thyromegaly Lungs:    Clear to auscultation  bilaterally; normal respiratory effort CV:         Regular rate and rhythm; no murmurs Abd:      + bowel sounds; soft, non-tender; no palpable masses, no distension Ext:    No edema; adequate peripheral perfusion Skin:      Warm and dry; no rash Neuro: alert and oriented x 3   Resolved Hospital Problem list     Assessment & Plan:  Severe ARDS secondary to COVID-19 pneumonia Baseline COPD on 4 L oxygen. Continue high flow nasal cannula and oxygen support Wean as able Incentive spirometer, mobilize, pulmonary hygiene Continue monitoring in ICU  CHF Paroxysmal atrial fibrillation on Coumadin anticoagulation as outpatient Diuresis as tolerated Continue Cardizem Following INR, Coumadin dosing per pharmacy   Chronic kidney disease, AKI Follow urine output, BMP Avoid nephrotoxins, renal dose medications  Diabetes Sliding-scale insulin, Levemir 10 units twice daily  Hypothyroidism- last TSH 5, 04/2019 Continue Synthroid  Best practice:  Diet: PO diet Pain/Anxiety/Delirium protocol (if indicated): NA VAP protocol (if indicated): NA DVT prophylaxis: Coumadin on hold GI prophylaxis: PPI Glucose control: see above Mobility: Bed Code Status: Full Family Communication: Per primary Disposition: ICU  Chilton Greathouse MD [facility] Please see [email or web address] for pager details.  02/22/2020, 12:07 PM

## 2020-02-23 ENCOUNTER — Inpatient Hospital Stay (HOSPITAL_COMMUNITY): Payer: Medicare HMO

## 2020-02-23 LAB — CBC WITH DIFFERENTIAL/PLATELET
Abs Immature Granulocytes: 0.24 10*3/uL — ABNORMAL HIGH (ref 0.00–0.07)
Basophils Absolute: 0 10*3/uL (ref 0.0–0.1)
Basophils Relative: 0 %
Eosinophils Absolute: 0 10*3/uL (ref 0.0–0.5)
Eosinophils Relative: 0 %
HCT: 37 % (ref 36.0–46.0)
Hemoglobin: 11.7 g/dL — ABNORMAL LOW (ref 12.0–15.0)
Immature Granulocytes: 2 %
Lymphocytes Relative: 5 %
Lymphs Abs: 0.6 10*3/uL — ABNORMAL LOW (ref 0.7–4.0)
MCH: 29.3 pg (ref 26.0–34.0)
MCHC: 31.6 g/dL (ref 30.0–36.0)
MCV: 92.5 fL (ref 80.0–100.0)
Monocytes Absolute: 0.5 10*3/uL (ref 0.1–1.0)
Monocytes Relative: 4 %
Neutro Abs: 10.2 10*3/uL — ABNORMAL HIGH (ref 1.7–7.7)
Neutrophils Relative %: 89 %
Platelets: 292 10*3/uL (ref 150–400)
RBC: 4 MIL/uL (ref 3.87–5.11)
RDW: 13.3 % (ref 11.5–15.5)
WBC: 11.6 10*3/uL — ABNORMAL HIGH (ref 4.0–10.5)
nRBC: 0 % (ref 0.0–0.2)

## 2020-02-23 LAB — COMPREHENSIVE METABOLIC PANEL
ALT: 11 U/L (ref 0–44)
AST: 13 U/L — ABNORMAL LOW (ref 15–41)
Albumin: 2.8 g/dL — ABNORMAL LOW (ref 3.5–5.0)
Alkaline Phosphatase: 68 U/L (ref 38–126)
Anion gap: 10 (ref 5–15)
BUN: 71 mg/dL — ABNORMAL HIGH (ref 8–23)
CO2: 26 mmol/L (ref 22–32)
Calcium: 9.2 mg/dL (ref 8.9–10.3)
Chloride: 101 mmol/L (ref 98–111)
Creatinine, Ser: 1.82 mg/dL — ABNORMAL HIGH (ref 0.44–1.00)
GFR calc Af Amer: 33 mL/min — ABNORMAL LOW (ref >60)
GFR calc non Af Amer: 29 mL/min — ABNORMAL LOW (ref >60)
Glucose, Bld: 104 mg/dL — ABNORMAL HIGH (ref 70–99)
Potassium: 4.3 mmol/L (ref 3.5–5.1)
Sodium: 137 mmol/L (ref 135–145)
Total Bilirubin: 0.7 mg/dL (ref 0.3–1.2)
Total Protein: 5.5 g/dL — ABNORMAL LOW (ref 6.5–8.1)

## 2020-02-23 LAB — PROTIME-INR
INR: 2.1 — ABNORMAL HIGH (ref 0.8–1.2)
Prothrombin Time: 22.6 seconds — ABNORMAL HIGH (ref 11.4–15.2)

## 2020-02-23 LAB — GLUCOSE, CAPILLARY
Glucose-Capillary: 77 mg/dL (ref 70–99)
Glucose-Capillary: 99 mg/dL (ref 70–99)
Glucose-Capillary: 226 mg/dL — ABNORMAL HIGH (ref 70–99)
Glucose-Capillary: 341 mg/dL — ABNORMAL HIGH (ref 70–99)

## 2020-02-23 LAB — C-REACTIVE PROTEIN: CRP: 0.6 mg/dL (ref <1.0)

## 2020-02-23 LAB — MAGNESIUM: Magnesium: 2.4 mg/dL (ref 1.7–2.4)

## 2020-02-23 LAB — D-DIMER, QUANTITATIVE: D-Dimer, Quant: 5.13 ug/mL-FEU — ABNORMAL HIGH (ref 0.00–0.50)

## 2020-02-23 LAB — PROCALCITONIN: Procalcitonin: <0.1 ng/mL

## 2020-02-23 LAB — BRAIN NATRIURETIC PEPTIDE: B Natriuretic Peptide: 142 pg/mL — ABNORMAL HIGH (ref 0.0–100.0)

## 2020-02-23 MED ORDER — FUROSEMIDE 10 MG/ML IJ SOLN
40.0000 mg | Freq: Once | INTRAMUSCULAR | Status: AC
  Administered 2020-02-23: 40 mg via INTRAVENOUS
  Filled 2020-02-23: qty 4

## 2020-02-23 MED ORDER — SIMVASTATIN 20 MG PO TABS
20.0000 mg | ORAL_TABLET | Freq: Every day | ORAL | Status: DC
  Administered 2020-02-23 – 2020-03-01 (×8): 20 mg via ORAL
  Filled 2020-02-23 (×8): qty 1

## 2020-02-23 MED ORDER — SENNOSIDES-DOCUSATE SODIUM 8.6-50 MG PO TABS
1.0000 | ORAL_TABLET | Freq: Two times a day (BID) | ORAL | Status: DC
  Administered 2020-02-23 – 2020-03-01 (×8): 1 via ORAL
  Filled 2020-02-23 (×11): qty 1

## 2020-02-23 MED ORDER — WARFARIN SODIUM 5 MG PO TABS
5.0000 mg | ORAL_TABLET | Freq: Once | ORAL | Status: AC
  Administered 2020-02-23: 5 mg via ORAL
  Filled 2020-02-23: qty 1

## 2020-02-23 MED ORDER — POLYSACCHARIDE IRON COMPLEX 150 MG PO CAPS
300.0000 mg | ORAL_CAPSULE | Freq: Every day | ORAL | Status: DC
  Administered 2020-02-23 – 2020-03-01 (×8): 300 mg via ORAL
  Filled 2020-02-23 (×9): qty 2

## 2020-02-23 NOTE — Progress Notes (Signed)
Physical Therapy Treatment Patient Details Name: Amy Bryant MRN: [id number] DOB: 01/14/1956 Today's Date: 02/23/2020    History of Present Illness 64 y.o. female admitted on 03/07/20 for SOB.  Dx with COVID 19 PNA and decompensated diastolic heart failure. Pt with significant PMH of DM2, depression/anxiety, PE, PAF, CKD III, COPD (on 4 L O2 [place] at baseline), morbid obestiy, CHF, cellulitis.     PT Comments    Pt declined OOB today, "feeling weird", but eager to do exercises.    Follow Up Recommendations  Home health PT     Equipment Recommendations  Other (comment)(tub transfer bench)    Recommendations for Other Services       Precautions / Restrictions Precautions Precautions: Fall    Mobility  Bed Mobility         Supine to sit: Supervision Sit to supine: Supervision   General bed mobility comments: sitting up in bed--long sitting  Transfers                    Ambulation/Gait                 Stairs             Wheelchair Mobility    Modified Rankin (Stroke Patients Only)       Balance                                            Cognition Arousal/Alertness: Awake/alert Behavior During Therapy: WFL for tasks assessed/performed Overall Cognitive Status: Within Functional Limits for tasks assessed                                        Exercises General Exercises - Upper Extremity Elbow Flexion: Strengthening;Both;Supine;15 reps(resisted) Elbow Extension: Strengthening;Both;Supine;15 reps(resisted) General Exercises - Lower Extremity Ankle Circles/Pumps: AROM;Both;Other reps (comment);Supine;Other (comment)(40 reps) Hip ABduction/ADduction: AROM;Strengthening;Both;Supine;15 reps(resisted) Straight Leg Raises: AROM;Both;10 reps;Supine Hip Flexion/Marching: AROM;Both;10 reps;Supine;Other (comment)(resisted)    General Comments General comments (skin integrity, edema, etc.): During the  exercise program for UE and LE's on 35 L HHF pt's sats generally maintained at 89/90% with low of 87% and high of 91%      Pertinent Vitals/Pain Pain Assessment: No/denies pain    Home Living                      Prior Function            PT Goals (current goals can now be found in the care plan section) Acute Rehab PT Goals Patient Stated Goal: to go home and see her grandchildren again PT Goal Formulation: With patient Time For Goal Achievement: 03/03/20 Potential to Achieve Goals: Good Progress towards PT goals: Progressing toward goals    Frequency    Min 3X/week      PT Plan Current plan remains appropriate    Co-evaluation              AM-PAC PT "6 Clicks" Mobility   Outcome Measure  Help needed turning from your back to your side while in a flat bed without using bedrails?: A Little Help needed moving from lying on your back to sitting on the side of a flat bed without using bedrails?: A Little Help needed moving to and from  a bed to a chair (including a wheelchair)?: A Little Help needed standing up from a chair using your arms (e.g., wheelchair or bedside chair)?: A Little Help needed to walk in hospital room?: A Little Help needed climbing 3-5 steps with a railing? : A Lot 6 Click Score: 17    End of Session Equipment Utilized During Treatment: Oxygen Activity Tolerance: Patient tolerated treatment well Patient left: in bed;with call bell/phone within reach;with bed alarm set Nurse Communication: Mobility status PT Visit Diagnosis: Difficulty in walking, not elsewhere classified (R26.2)     Time: 1572-6203 PT Time Calculation (min) (ACUTE ONLY): 21 min  Charges:  $Therapeutic Exercise: 8-22 mins                     02/23/2020  [name], PT Acute Rehabilitation Services [phone]  (pager) [phone]  (office)   [name] 02/23/2020, 5:32 PM

## 2020-02-23 NOTE — Progress Notes (Signed)
NAME:  Amy Bryant, MRN:  [id number], DOB:  [birth year], LOS: 7 ADMISSION DATE:  02/02/2020, CONSULTATION DATE: 02/18/2020 REFERRING MD:  Jeoffrey Massed, CHIEF COMPLAINT: COVID-19 pneumonia  Brief History   64 year old with past medical history including COPD, CKD, chronic respiratory failure presenting with COVID-19 pneumonia on 5/18. Treated with steroids, remdesivir and Actemra. PCCM consulted for worsening hypoxia  Past Medical History  COPD with chronic hypoxic respiratory failure requiring 4 L [place] supplemental oxygen at all times Chronic diastolic congestive heart failure PAF on Coumadin Prior history of PE CKD stage IIIb Noninsulin-dependent type 2 diabetes Morbid obesity Reactive airway dysfunction  Osteoarthritis  Hypothyroidism GERD  Significant Hospital Events   5/19 Adit 5/20 Transfer to ICU 5/24 Remains on HHF and NRB  Consults:  PCCM  Procedures:    Significant Diagnostic Tests:  Chest x-ray 5/20-cardiomegaly, patchy airspace disease, vascular congestion  Echo 5/19-LVEF 60 to 65%, moderate LA, RA dilation  Micro Data:  5/18 sars2: positive 5/18 blood: negative Antimicrobials/COVID Rx  Solumedrol5/18>> Remdesivir:5/18>> 5/23 Actemra: 5/18 x 1  Interim history/subjective:  5/25: on 40L and 100% hhfnc, just seen after coughing and utilizing flutter valve and sats stuck in high 70's- 80. Placed on NRB with improvement to mid 80's. Pt states did not rest well overnight, feels anxious. encouraged to [place] NRB less and short intervals if necessary to recover when eating, coughing etc. She is frustrated with the HHFNC due to nare irritation.  5/24:Continues to have high O2 requirements.  Overall states that she is feeling better.  Objective   Blood pressure 110/60, pulse 79, temperature 98.6 F (37 C), temperature source Oral, resp. rate 17, height 5' 5.5" (1.664 m), weight (!) 137.5 kg, SpO2 94 %.    FiO2 (%):  [90 %-100 %] 100 %   Intake/Output Summary  (Last 24 hours) at 02/23/2020 1119 Last data filed at 02/23/2020 0000 Gross per 24 hour  Intake --  Output 2300 ml  Net -2300 ml   Filed Weights   02/21/20 0600 02/22/20 0500 02/23/20 0500  Weight: 132.9 kg (!) 137.4 kg (!) 137.5 kg    Examination: Gen:      No acute distress, obese, pleasant, sitting up in bed. Ruddy face  HEENT:  EOMI, sclera anicteric Neck:     No masses; no thyromegaly Lungs:    Clear to auscultation bilaterally; normal respiratory effort CV:         Regular rate and rhythm; no murmurs Abd:      + bowel sounds; soft, non-tender; no palpable masses, no distension Ext:    No edema; adequate peripheral perfusion Skin:      Warm and dry; no rash Neuro: alert and oriented x 3   Resolved Hospital Problem list     Assessment & Plan:  Severe ARDS secondary to COVID-19 pneumonia Baseline COPD on 4 L oxygen. Continue HHFNC and oxygen support sat >85% -intermittent use of NRB for short periods -Encourage self prone -Incentive spirometer, mobilize, pulmonary hygiene -Continue monitoring in ICU  CHF Paroxysmal atrial fibrillation on Coumadin anticoagulation as outpatient -Diuresis as tolerated based on renal function  Continue Cardizem Following INR, Coumadin dosing per pharmacy   Chronic kidney disease, AKI Follow urine output -renal indices, stable to slightly improved 2.1->1.8 -Avoid nephrotoxins, renal dose medications  Diabetes -Sliding-scale insulin, -Levemir 10 units twice daily  Hypothyroidism- last TSH 5, 04/2019 Continue Synthroid  Best practice:  Diet: PO diet Pain/Anxiety/Delirium protocol (if indicated): NA VAP protocol (if indicated): NA DVT prophylaxis:  Coumadin GI prophylaxis: PPI Glucose control: see above Mobility: Bed Code Status: Full Family Communication: Per primary Disposition: ICU   care time 35 mins. This represents my time independent of the NPs time taking care of the pt. This is excluding procedures.    [facility] 02/23/2020, 11:42 AM

## 2020-02-23 NOTE — Progress Notes (Signed)
PROGRESS NOTE                                                                                                                                                                                                             Patient Demographics:    Amy Bryant, is a 64 y.o. female, DOB - 04/26/1956, DDU:[id number]  Outpatient Primary MD for the patient is [name], [name], MD   Admit date - 02/13/2020   LOS - 7  CC - SOB     Brief Narrative:  Patient is a 64 y.o. female with PMHx of COPD with chronic hypoxic respiratory failure on 4 L of oxygen at home, chronic diastolic heart failure, PAF on Coumadin, history of PE, CKD stage IIIb, non-insulin-dependent DM-2, morbid obesity-who presented to the hospital with approximately 2-3-day history of worsening shortness of breath.  For approximately 1 week-she has been having nasal congestion/URI symptoms-her PCP called in prednisone and Zithromax which she took without any relief.  In the emergency room-she was noted to have severe hypoxemia-requiring NRB-she was subsequently admitted to the hospitalist service.  Note-has not received the Covid vaccine.  Significant Events: 5/18>> admit to [facility] for severe hypoxemia requiring nonrebreather mask. 5/19>> on heated high flow 5/20>> on heated high flow+ NRB-transfer to MICU  COVID-19 medications: Steroids: 5/18>> Remdesivir: 5/18>>5/22 Actemra: 5/18 x 1  Antibiotics: None  Microbiology data: 5/18: Blood culture>> no growth  Significant studies: 5/20: Bilateral lower extremity Doppler>> negative for DVT. 5/19: Echo>> EF 60-65% 5/19: Chest x-ray>> interstitial pulmonary edema/patchy airspace opacity in the left base 5/18: Chest x-ray>> new diffuse interstitial thickening/reticular opacities suspicious for pulmonary edema  DVT prophylaxis: Coumadin-supratherapeutic INR.  Procedures: None  Consults: PCCM    Subjective:    Patient in bed, appears comfortable, denies any headache, no fever, no chest pain or pressure, mild shortness of breath , no abdominal pain. No focal weakness.   Assessment  & Plan :   Acute on chronic hypoxic Resp Failure due to Covid 19 Viral pneumonia and decompensated diastolic heart failure: She had severe disease and likely presented once her parenchymal damage was quite advanced due to COVID-19 pneumonia and inflammation, received appropriate care with IV steroids, remdesivir and Actemra.  She is currently on IV steroid taper along with IV Lasix as needed , she has finished remdesivir and Actemra treatment. She  is finally feeling better and clinically improving although still on heated high flow around 40 L with 100% FiO2 with intermittent use of nonrebreather mask but there is gradual improvement, will try to titrate her off on nonrebreather mask so that we know exactly how much oxygen she is requiring even if that means going up on heated high flow.  We will continue to monitor and adjust as needed.  She has developed a few rails with repeat IV Lasix on 02/23/2020.  Fever: afebrile  Prone/Incentive Spirometry: She is unable to prone or lie on her side-have encouraged incentive spirometry/flutter use 5 times an hour  O2 requirements:  SpO2: (!) 87 % O2 Flow Rate (L/min): 35 L/min(plus 15L NRB.) FiO2 (%): 100 %   COVID-19 Labs: Recent Labs    02/21/20 0105 02/23/20 0441  DDIMER 3.69* 5.13*  CRP 0.7 0.6       Component Value Date/Time   BNP 142.0 (H) 02/23/2020 0441    Recent Labs  Lab 02/20/20 0530 02/21/20 0105 02/22/20 0656 02/23/20 0441  PROCALCITON <0.10 <0.10 <0.10 <0.10    Lab Results  Component Value Date   SARSCOV2NAA POSITIVE (A) 02/19/2020   SARSCOV2NAA NEGATIVE 10/06/2019   SARSCOV2NAA NOT DETECTED 04/08/2019   SARSCOV2NAA NOT DETECTED 04/02/2019    Decompensated acute on chronic diastolic heart failure EF 60%: IV Lasix as needed.  Elevated D-dimer:  On chronic Coumadin, INR in good range and D-dimer now trending down, lower extremity venous duplex unremarkable.  COPD: Not in exacerbation this morning-no wheezing-decrease steroids to twice daily dosing.  Continue bronchodilators.  Normally on 4 L of oxygen at all times at home.  AKI on CKD stage IIIb: Baseline creatinine around 1.8, monitor cautiously with diuresis, [place] close to baseline.  PAF with [place] vas 2 score of at least 3: Rate controlled with Cardizem-INR remains supratherapeutic-pharmacy managing Coumadin.  Cardizem dose adjusted further for better rate control.  Remote history of pulmonary embolism: on Coumadin pharmacy following.  Hypothyroidism: Continue Synthroid  Depression/anxiety: Slightly anxious due to acute illness/hypoxia/oxygen requirement-continue as needed Xanax and Prozac.  DM-2 (A1c 5.2 on 10/07/2019): CBGs stable on SSI-follow and adjust.  Recent Labs    02/22/20 1601 02/22/20 2052 02/23/20 0734  GLUCAP 160* 253* 77   Morbid Obesity: Estimated body mass index is 49.68 kg/m as calculated from the following:   Height as of this encounter: 5' 5.5" (1.664 m).   Weight as of this encounter: 137.5 kg.    GI prophylaxis: PPI  Condition - Extremely Guarded-very tenuous with risk for further deterioration  Family Communication  : Prefers that she update her family-she does not want me to call her mother or her son-she fears that her family will panic   Code Status :  Full Code  Diet :  Diet Order            Diet heart healthy/carb modified Room service appropriate? Yes; Fluid consistency: Thin; Fluid restriction: 1200 mL Fluid  Diet effective now               Disposition Plan  :   Status is: Inpatient  Remains inpatient appropriate because:Inpatient level of care appropriate due to severity of illness   Dispo: The patient is from: Home              Anticipated d/c is to: SNF              Anticipated d/c date is: > 3 days  Patient currently is not medically stable to d/c.  Barriers to discharge: Severe Hypoxia requiring O2 supplementation/complete 5 days of IV Remdesivir  Antimicorbials  :    Anti-infectives (From admission, onward)   Start     Dose/Rate Route Frequency Ordered Stop   02/17/20 1000  remdesivir 100 mg in sodium chloride 0.9 % 100 mL IVPB     100 mg 200 mL/hr over 30 Minutes Intravenous Daily 17-Mar-2020 2202 02/20/20 0955   17-Mar-2020 2215  remdesivir 200 mg in sodium chloride 0.9% 250 mL IVPB     200 mg 580 mL/hr over 30 Minutes Intravenous Once 17-Mar-2020 2202 17-Mar-2020 2319      Inpatient Medications  Scheduled Meds: . vitamin C  500 mg Oral Daily  . Chlorhexidine Gluconate Cloth  6 each Topical Daily  . cholecalciferol  1,000 Units Oral Daily  . diltiazem  240 mg Oral Daily  . FLUoxetine  40 mg Oral Daily  . fluticasone  1 spray Each Nare Daily  . fluticasone furoate-vilanterol  1 puff Inhalation Daily  . furosemide  40 mg Intravenous Once  . insulin aspart  0-5 Units Subcutaneous QHS  . insulin aspart  0-9 Units Subcutaneous TID WC  . insulin detemir  10 Units Subcutaneous BID  . levothyroxine  175 mcg Oral Q0600  . methylPREDNISolone (SOLU-MEDROL) injection  40 mg Intravenous Daily  . pantoprazole  40 mg Oral Q1200  . umeclidinium bromide  1 puff Inhalation Daily  . warfarin  5 mg Oral ONCE-1600  . Warfarin - Pharmacist Dosing Inpatient   Does not apply q1600  . zinc sulfate  220 mg Oral Daily   Continuous Infusions:  PRN Meds:.acetaminophen, albuterol, ALPRAZolam, chlorpheniramine-HYDROcodone, diltiazem, guaiFENesin-dextromethorphan, lip balm, oxymetazoline, sodium chloride, traMADol   Time Spent in minutes  35    See all Orders from today for further details   Susa RaringPrashant Belita Warsame M.D on 02/23/2020 at 10:16 AM  To page go to [email or web address] - use universal password  [facility] -  Office  [phone]    Objective:   Vitals:   02/23/20 0500 02/23/20 0600 02/23/20  0700 02/23/20 0800  BP: 131/83 122/73 127/84   Pulse: 69 69 64 73  Resp: 15 14 17 17   Temp:    98.6 F (37 C)  TempSrc:    Oral  SpO2: (!) 87% (!) 88% 92% (!) 87%  Weight: (!) 137.5 kg     Height:        Wt Readings from Last 3 Encounters:  02/23/20 (!) 137.5 kg  01/28/20 132 kg  01/06/20 132 kg     Intake/Output Summary (Last 24 hours) at 02/23/2020 1016 Last data filed at 02/23/2020 0000 Gross per 24 hour  Intake --  Output 2300 ml  Net -2300 ml     Physical Exam  Awake Alert, No new F.N deficits, Normal affect [place].AT,PERRAL Supple Neck,No JVD, No cervical lymphadenopathy appriciated.  Symmetrical Chest wall movement, Good air movement bilaterally, few rales RRR,No Gallops, Rubs or new Murmurs, No Parasternal Heave +ve B.Sounds, Abd Soft, No tenderness, No organomegaly appriciated, No rebound - guarding or rigidity. No Cyanosis, Clubbing or edema, No new Rash or bruise     Data Review:    CBC Recent Labs  Lab 02/18/20 0544 02/18/20 0544 02/19/20 0715 02/20/20 0607 02/21/20 0105 02/22/20 0806 02/23/20 0441  WBC 5.7   < > 8.7 9.4 12.0* 11.9* 11.6*  HGB 9.9*   < > 10.8* 10.8* 11.4* 11.1* 11.7*  HCT 33.0*   < >  36.0 35.3* 36.8 35.8* 37.0  PLT 303   < > 323 319 319 305 292  MCV 95.9   < > 95.2 93.6 92.2 93.0 92.5  MCH 28.8   < > 28.6 28.6 28.6 28.8 29.3  MCHC 30.0   < > 30.0 30.6 31.0 31.0 31.6  RDW 13.6   < > 13.5 13.2 13.2 13.3 13.3  LYMPHSABS 0.4*  --  0.3* 0.3* 0.3*  --  0.6*  MONOABS 0.2  --  0.2 0.2 0.4  --  0.5  EOSABS 0.0  --  0.0 0.0 0.0  --  0.0  BASOSABS 0.0  --  0.0 0.0 0.0  --  0.0   < > = values in this interval not displayed.    Chemistries  Recent Labs  Lab 02/18/20 0544 02/18/20 0544 02/19/20 0715 02/20/20 0530 02/20/20 0607 02/21/20 0105 02/22/20 0656 02/22/20 0806 02/23/20 0441  NA 140   < > 137  --  139 139  --  136 137  K 4.1   < > 3.9  --  4.0 4.3  --  4.3 4.3  CL 104   < > 102  --  102 103  --  103 101  CO2 27   < >  26  --  27 26  --  25 26  GLUCOSE 184*   < > 190*  --  188* 176*  --  143* 104*  BUN 52*   < > 64*  --  70* 73*  --  77* 71*  CREATININE 2.07*   < > 2.17*  --  1.98* 1.90*  --  2.10* 1.82*  CALCIUM 9.6   < > 9.6  --  9.6 9.5  --  9.1 9.2  MG  --   --   --  2.2  --  2.2 2.2  --  2.4  AST 14*  --  11*  --  9* 12*  --   --  13*  ALT 9  --  11  --  10 10  --   --  11  ALKPHOS 62  --  66  --  60 61  --   --  68  BILITOT 0.3  --  0.9  --  1.0 0.8  --   --  0.7   < > = values in this interval not displayed.   ------------------------------------------------------------------------------------------------------------------ No results for input(s): CHOL, HDL, LDLCALC, TRIG, CHOLHDL, LDLDIRECT in the last 72 hours.  Lab Results  Component Value Date   HGBA1C 5.8 (H) 02/17/2020   ------------------------------------------------------------------------------------------------------------------ No results for input(s): TSH, T4TOTAL, T3FREE, THYROIDAB in the last 72 hours.  Invalid input(s): FREET3 ------------------------------------------------------------------------------------------------------------------ No results for input(s): VITAMINB12, FOLATE, FERRITIN, TIBC, IRON, RETICCTPCT in the last 72 hours.  Coagulation profile Recent Labs  Lab 02/19/20 0715 02/20/20 0607 02/21/20 0105 02/22/20 0656 02/23/20 0441  INR 3.3* 2.4* 2.4* 2.3* 2.1*    Recent Labs    02/21/20 0105 02/23/20 0441  DDIMER 3.69* 5.13*    Cardiac Enzymes No results for input(s): CKMB, TROPONINI, MYOGLOBIN in the last 168 hours.  Invalid input(s): CK ------------------------------------------------------------------------------------------------------------------    Component Value Date/Time   BNP 142.0 (H) 02/23/2020 0441    Micro Results Recent Results (from the past 240 hour(s))  SARS CORONAVIRUS 2 (TAT 6-24 HRS) Nasopharyngeal Nasopharyngeal Swab     Status: Abnormal   Collection Time: 02/28/2020   5:25 PM   Specimen: Nasopharyngeal Swab  Result Value Ref Range Status  SARS Coronavirus 2 POSITIVE (A) NEGATIVE Final    Comment: RESULT CALLED TO, READ BACK BY AND VERIFIED WITH: RN S GRINDSTAFF @0028  02/17/20 BY S GEZAHEGN (NOTE) SARS-CoV-2 target nucleic acids are DETECTED. The SARS-CoV-2 RNA is generally detectable in upper and lower respiratory specimens during the acute phase of infection. Positive results are indicative of the presence of SARS-CoV-2 RNA. Clinical correlation with patient history and other diagnostic information is  necessary to determine patient infection status. Positive results do not rule out bacterial infection or co-infection with other viruses.  The expected result is Negative. Fact Sheet for Patients: HairSlick.no Fact Sheet for Healthcare Providers: quierodirigir.com This test is not yet approved or cleared by the [place] FDA and  has been authorized for detection and/or diagnosis of SARS-CoV-2 by FDA under an Emergency Use Authorization (EUA). This EUA will remain  in effect (meaning this test can be used)  for the duration of the COVID-19 declaration under Section 564(b)(1) of the Act, 21 U.S.C. section 360bbb-3(b)(1), unless the authorization is terminated or revoked sooner. Performed at [facility], [address], [place], [place] [address]   Blood Culture (routine x 2)     Status: None   Collection Time: 02-19-2020  5:28 PM   Specimen: BLOOD  Result Value Ref Range Status   Specimen Description BLOOD RIGHT ANTECUBITAL  Final   Special Requests   Final    BOTTLES DRAWN AEROBIC AND ANAEROBIC Blood Culture adequate volume   Culture   Final    NO GROWTH 5 DAYS Performed at [facility], [address] [place], [place] [address]    Report Status 02/21/2020 FINAL  Final  Blood Culture (routine x 2)     Status: None   Collection Time: Feb 19, 2020  5:47 PM   Specimen: BLOOD  LEFT FOREARM  Result Value Ref Range Status   Specimen Description BLOOD LEFT FOREARM  Final   Special Requests   Final    BOTTLES DRAWN AEROBIC AND ANAEROBIC Blood Culture adequate volume   Culture   Final    NO GROWTH 5 DAYS Performed at [facility], [address] [place], [place] [address]    Report Status 02/21/2020 FINAL  Final    Radiology Reports DG Chest Port 1 View  Result Date: 02/23/2020 CLINICAL DATA:  COVID pneumonia. EXAM: PORTABLE CHEST 1 VIEW COMPARISON:  One-view chest x-ray 02/20/2020 FINDINGS: Heart is enlarged. Atherosclerotic calcifications are present at the aortic arch. Diffuse interstitial and airspace disease is present. Consolidation at the left base is slightly less dense. A diffuse interstitial pattern has increased some. IMPRESSION: 1. Diffuse interstitial and airspace disease compatible with pneumonia. An element of edema is not excluded. 2. Consolidation at the left base is slightly less dense. Electronically Signed   By: Marin Roberts M.D.   On: 02/23/2020 08:49   DG Chest Port 1 View  Result Date: 02/20/2020 CLINICAL DATA:  Shortness of breath, history of COVID-19 positivity EXAM: PORTABLE CHEST 1 VIEW COMPARISON:  02/17/2020 FINDINGS: Cardiac shadow is stable but enlarged. Aortic calcifications are seen. Patchy opacities are noted primarily in the left base consistent with the given clinical history. Mild vascular congestion is noted centrally. No other focal abnormality is seen. IMPRESSION: Mild vascular congestion. Persistent opacity in the left base consistent with the given clinical history. Electronically Signed   By: Alcide Clever M.D.   On: 02/20/2020 08:29   DG Chest Port 1 View  Result Date: 2020-02-19 CLINICAL DATA:  Dyspnea, fever. EXAM: PORTABLE CHEST 1 VIEW COMPARISON:  Radiograph 10/06/2019 FINDINGS: Significant interval change from prior exam with diffuse interstitial thickening and reticular opacities. There is also perivascular  haziness. Cardiomegaly with unchanged mediastinal contours. Aortic atherosclerosis. Possible small pleural effusions. No visualized pneumothorax. IMPRESSION: New diffuse interstitial thickening and reticular opacities suspicious for pulmonary edema. Similar cardiomegaly. Possible small pleural effusions. Findings suspicious for CHF. Electronically Signed   By: Narda Rutherford M.D.   On: 02/01/2020 18:11   DG Chest Port 1V same Day  Result Date: 02/17/2020 CLINICAL DATA:  Shortness of breath EXAM: PORTABLE CHEST 1 VIEW COMPARISON:  Feb 16, 2020 FINDINGS: There is cardiomegaly with pulmonary venous hypertension. There is interstitial edema with patchy airspace opacity in the left base. No adenopathy. There is aortic atherosclerosis. No bone lesions. IMPRESSION: Cardiomegaly with pulmonary vascular congestion. There is interstitial pulmonary edema. Suspect a degree of congestive heart failure. Patchy airspace opacity in the left base is concerning for superimposed pneumonia, although alveolar edema in this area could present in this manner. Both pneumonia and alveolar edema could present concurrently. In comparison with 1 day prior, there appears to be somewhat less interstitial edema. Aortic Atherosclerosis (ICD10-I70.0). Electronically Signed   By: Bretta Bang III M.D.   On: 02/17/2020 08:01   ECHOCARDIOGRAM COMPLETE  Result Date: 02/17/2020    ECHOCARDIOGRAM REPORT   Patient Name:   LEANNDRA PEMBER Date of Exam: 02/17/2020 Medical Rec #:  [id number]      Height:       65.5 in Accession #:    [id number]     Weight:       290.3 lb Date of Birth:  12-23-1955      BSA:          2.331 m Patient Age:    63 years       BP:           120/67 mmHg Patient Gender: F              HR:           75 bpm. Exam Location:  Inpatient Procedure: 2D Echo Indications:    CHF 428  History:        Patient has prior history of Echocardiogram examinations, most                 recent 01/09/2019. CHF, COPD, Arrythmias:Atrial  Fibrillation;                 Risk Factors:Diabetes and Former Smoker. Covid +. cor pulmonale.                 pulmonary embolism.  Sonographer:    Celene Skeen RDCS (AE) Referring Phys: [id number] John Giovanni  Sonographer Comments: Patient is morbidly obese. Image acquisition challenging due to patient body habitus and Image acquisition challenging due to respiratory motion. limited mobility IMPRESSIONS  1. Left ventricular ejection fraction, by estimation, is 60 to 65%. The left ventricle has normal function. The left ventricle has no regional wall motion abnormalities. Left ventricular diastolic parameters are indeterminate.  2. Right ventricular systolic function was not well visualized. The right ventricular size is not well visualized.  3. Left atrial size was moderately dilated.  4. Right atrial size was mild to moderately dilated.  5. The mitral valve is normal in structure. No evidence of mitral valve regurgitation. No evidence of mitral stenosis.  6. The aortic valve was not well visualized. Aortic valve regurgitation is trivial. Mild  to moderate aortic valve sclerosis/calcification is present, without any evidence of aortic stenosis.  7. The inferior vena cava not well seen. FINDINGS  Left Ventricle: Left ventricular ejection fraction, by estimation, is 60 to 65%. The left ventricle has normal function. The left ventricle has no regional wall motion abnormalities. The left ventricular internal cavity size was normal in size. There is  no left ventricular hypertrophy. Left ventricular diastolic parameters are indeterminate. Right Ventricle: The right ventricular size is not well visualized. Right vetricular wall thickness was not assessed. Right ventricular systolic function was not well visualized. Left Atrium: Left atrial size was moderately dilated. Right Atrium: Right atrial size was mild to moderately dilated. Pericardium: There is no evidence of pericardial effusion. Mitral Valve: The mitral  valve is normal in structure. There is mild thickening of the mitral valve leaflet(s). There is mild calcification of the mitral valve leaflet(s). Normal mobility of the mitral valve leaflets. Moderate mitral annular calcification. No evidence of mitral valve regurgitation. No evidence of mitral valve stenosis. Tricuspid Valve: The tricuspid valve is normal in structure. Tricuspid valve regurgitation is mild . No evidence of tricuspid stenosis. Aortic Valve: The aortic valve was not well visualized. Aortic valve regurgitation is trivial. Mild to moderate aortic valve sclerosis/calcification is present, without any evidence of aortic stenosis. Pulmonic Valve: The pulmonic valve was normal in structure. Pulmonic valve regurgitation is not visualized. No evidence of pulmonic stenosis. Aorta: The aortic root is normal in size and structure. Venous: The inferior vena cava not well seen. IAS/Shunts: The interatrial septum was not well visualized.  LEFT VENTRICLE PLAX 2D LVIDd:         5.30 cm LVIDs:         3.40 cm LV PW:         1.10 cm LV IVS:        1.00 cm LVOT diam:     1.80 cm LV SV:         38 LV SV Index:   16 LVOT Area:     2.54 cm  RIGHT VENTRICLE RV S prime:     14.30 cm/s TAPSE (M-mode): 1.9 cm LEFT ATRIUM           Index       RIGHT ATRIUM           Index LA diam:      4.80 cm 2.06 cm/m  RA Area:     17.70 cm LA Vol (A4C): 44.0 ml 18.88 ml/m RA Volume:   45.20 ml  19.39 ml/m  AORTIC VALVE LVOT Vmax:   83.90 cm/s LVOT Vmean:  59.500 cm/s LVOT VTI:    0.150 m  AORTA Ao Root diam: 3.40 cm MITRAL VALVE MV Area (PHT): 3.48 cm    SHUNTS MV Decel Time: 218 msec    Systemic VTI:  0.15 m MV E velocity: 73.70 cm/s  Systemic Diam: 1.80 cm Charlton Haws MD Electronically signed by Charlton Haws MD Signature Date/Time: 02/17/2020/3:39:01 PM    Final    VAS [place] LOWER EXTREMITY VENOUS (DVT)  Result Date: 02/18/2020  Lower Venous DVTStudy Indications: Swelling, and Edema.  Comparison Study: no prior Performing  Technologist: Blanch Media RVS  Examination Guidelines: A complete evaluation includes B-mode imaging, spectral Doppler, color Doppler, and power Doppler as needed of all accessible portions of each vessel. Bilateral testing is considered an integral part of a complete examination. Limited examinations for reoccurring indications may be performed as noted. The reflux portion of the exam  is performed with the patient in reverse Trendelenburg.  +---------+---------------+---------+-----------+----------+--------------+ RIGHT    CompressibilityPhasicitySpontaneityPropertiesThrombus Aging +---------+---------------+---------+-----------+----------+--------------+ CFV      Full           Yes      Yes                                 +---------+---------------+---------+-----------+----------+--------------+ SFJ      Full                                                        +---------+---------------+---------+-----------+----------+--------------+ FV Prox  Full                                                        +---------+---------------+---------+-----------+----------+--------------+ FV Mid   Full                                                        +---------+---------------+---------+-----------+----------+--------------+ FV DistalFull                                                        +---------+---------------+---------+-----------+----------+--------------+ PFV      Full                                                        +---------+---------------+---------+-----------+----------+--------------+ POP      Full           Yes      Yes                                 +---------+---------------+---------+-----------+----------+--------------+ PTV      Full                                                        +---------+---------------+---------+-----------+----------+--------------+ PERO     Full                                                         +---------+---------------+---------+-----------+----------+--------------+   +---------+---------------+---------+-----------+----------+--------------+ LEFT     CompressibilityPhasicitySpontaneityPropertiesThrombus Aging +---------+---------------+---------+-----------+----------+--------------+ CFV      Full           Yes      Yes                                 +---------+---------------+---------+-----------+----------+--------------+  SFJ      Full                                                        +---------+---------------+---------+-----------+----------+--------------+ FV Prox  Full                                                        +---------+---------------+---------+-----------+----------+--------------+ FV Mid   Full                                                        +---------+---------------+---------+-----------+----------+--------------+ FV DistalFull                                                        +---------+---------------+---------+-----------+----------+--------------+ PFV      Full                                                        +---------+---------------+---------+-----------+----------+--------------+ POP      Full           Yes      Yes                                 +---------+---------------+---------+-----------+----------+--------------+ PTV      Full                                                        +---------+---------------+---------+-----------+----------+--------------+ PERO     Full                                                        +---------+---------------+---------+-----------+----------+--------------+     Summary: BILATERAL: - No evidence of deep vein thrombosis seen in the lower extremities, bilaterally. -   *See table(s) above for measurements and observations. Electronically signed by Sherald Hess MD on 02/18/2020 at 5:31:51 PM.    Final

## 2020-02-23 NOTE — Progress Notes (Signed)
ANTICOAGULATION CONSULT NOTE  Pharmacy Consult for Coumadin Indication: history of atrial fibrillation and PE  Allergies  Allergen Reactions  . Pseudoephedrine Other (See Comments)    REACTION: tightness in chest  . Augmentin [Amoxicillin-Pot Clavulanate] Itching and Rash    Did it involve swelling of the face/tongue/throat, SOB, or low BP? No Did it involve sudden or severe rash/hives, skin peeling, or any reaction on the inside of your mouth or nose? Yes Did you need to seek medical attention at a hospital or doctor's office? Yes When did it last happen?2019 If all above answers are "NO", may proceed with cephalosporin use.   . Latex Itching and Rash    Patient Measurements: Height: 5' 5.5" (166.4 cm) Weight: (!) 137.5 kg (303 lb 2.1 oz) IBW/kg (Calculated) : 58.15  Vital Signs: Temp: 97.6 F (36.4 C) (05/25 0318) Temp Source: Oral (05/25 0318) BP: 106/47 (05/25 0453) Pulse Rate: 81 (05/25 0453)  Labs: Recent Labs    02/21/20 0105 02/21/20 0105 02/22/20 0656 02/22/20 0806 02/23/20 0441  HGB 11.4*   < >  --  11.1* 11.7*  HCT 36.8  --   --  35.8* 37.0  PLT 319  --   --  305 292  LABPROT 25.7*  --  24.9*  --  22.6*  INR 2.4*  --  2.3*  --  2.1*  CREATININE 1.90*  --   --  2.10* 1.82*   < > = values in this interval not displayed.    Estimated Creatinine Clearance: 44.3 mL/min (A) (by C-G formula based on SCr of 1.82 mg/dL (H)).   Assessment: 79 YOF admitted with Covid, to continue Coumadin for history of Afib and PE.  Patient had [facility] on Mar 10, 2020 with INR at 4.4.  INR therapeutic at 2.1 and trending down on lower Coumadin doses.  No bleeding reported.  Home Coumadin dose: 2.5mg  daily except 5mg  on Mon  Goal of Therapy:  INR 2-3   Plan:   Coumadin 5mg  PO today Daily PT / INR  Ligaya Cormier D. , PharmD, BCPS, BCCCP 02/23/2020, 7:36 AM

## 2020-02-24 LAB — COMPREHENSIVE METABOLIC PANEL
ALT: 8 U/L (ref 0–44)
AST: 12 U/L — ABNORMAL LOW (ref 15–41)
Albumin: 2.8 g/dL — ABNORMAL LOW (ref 3.5–5.0)
Alkaline Phosphatase: 74 U/L (ref 38–126)
Anion gap: 9 (ref 5–15)
BUN: 59 mg/dL — ABNORMAL HIGH (ref 8–23)
CO2: 25 mmol/L (ref 22–32)
Calcium: 9.3 mg/dL (ref 8.9–10.3)
Chloride: 103 mmol/L (ref 98–111)
Creatinine, Ser: 1.52 mg/dL — ABNORMAL HIGH (ref 0.44–1.00)
GFR calc Af Amer: 42 mL/min — ABNORMAL LOW (ref >60)
GFR calc non Af Amer: 36 mL/min — ABNORMAL LOW (ref >60)
Glucose, Bld: 78 mg/dL (ref 70–99)
Potassium: 4.3 mmol/L (ref 3.5–5.1)
Sodium: 137 mmol/L (ref 135–145)
Total Bilirubin: UNDETERMINED mg/dL (ref 0.3–1.2)
Total Protein: 5.2 g/dL — ABNORMAL LOW (ref 6.5–8.1)

## 2020-02-24 LAB — CBC WITH DIFFERENTIAL/PLATELET
Abs Immature Granulocytes: 0.21 10*3/uL — ABNORMAL HIGH (ref 0.00–0.07)
Basophils Absolute: 0 10*3/uL (ref 0.0–0.1)
Basophils Relative: 0 %
Eosinophils Absolute: 0.1 10*3/uL (ref 0.0–0.5)
Eosinophils Relative: 1 %
HCT: 38.5 % (ref 36.0–46.0)
Hemoglobin: 12.2 g/dL (ref 12.0–15.0)
Immature Granulocytes: 2 %
Lymphocytes Relative: 6 %
Lymphs Abs: 0.7 10*3/uL (ref 0.7–4.0)
MCH: 29.2 pg (ref 26.0–34.0)
MCHC: 31.7 g/dL (ref 30.0–36.0)
MCV: 92.1 fL (ref 80.0–100.0)
Monocytes Absolute: 0.6 10*3/uL (ref 0.1–1.0)
Monocytes Relative: 5 %
Neutro Abs: 10 10*3/uL — ABNORMAL HIGH (ref 1.7–7.7)
Neutrophils Relative %: 86 %
Platelets: 200 10*3/uL (ref 150–400)
RBC: 4.18 MIL/uL (ref 3.87–5.11)
RDW: 13.7 % (ref 11.5–15.5)
WBC: 11.6 10*3/uL — ABNORMAL HIGH (ref 4.0–10.5)
nRBC: 0 % (ref 0.0–0.2)

## 2020-02-24 LAB — MAGNESIUM: Magnesium: UNDETERMINED mg/dL (ref 1.7–2.4)

## 2020-02-24 LAB — GLUCOSE, CAPILLARY
Glucose-Capillary: 94 mg/dL (ref 70–99)
Glucose-Capillary: 139 mg/dL — ABNORMAL HIGH (ref 70–99)
Glucose-Capillary: 255 mg/dL — ABNORMAL HIGH (ref 70–99)
Glucose-Capillary: 214 mg/dL — ABNORMAL HIGH (ref 70–99)

## 2020-02-24 LAB — MRSA PCR SCREENING: MRSA by PCR: NEGATIVE

## 2020-02-24 LAB — BRAIN NATRIURETIC PEPTIDE: B Natriuretic Peptide: 123.8 pg/mL — ABNORMAL HIGH (ref 0.0–100.0)

## 2020-02-24 LAB — PROTIME-INR
INR: 2.3 — ABNORMAL HIGH (ref 0.8–1.2)
Prothrombin Time: 24.4 seconds — ABNORMAL HIGH (ref 11.4–15.2)

## 2020-02-24 MED ORDER — FUROSEMIDE 10 MG/ML IJ SOLN
60.0000 mg | Freq: Once | INTRAMUSCULAR | Status: AC
  Administered 2020-02-24: 60 mg via INTRAVENOUS
  Filled 2020-02-24: qty 6

## 2020-02-24 MED ORDER — INSULIN DETEMIR 100 UNIT/ML ~~LOC~~ SOLN: 8.0000 [IU] | Freq: Two times a day (BID) | SUBCUTANEOUS | Status: DC

## 2020-02-24 MED ORDER — METHYLPREDNISOLONE SODIUM SUCC 40 MG IJ SOLR
20.0000 mg | Freq: Every day | INTRAMUSCULAR | Status: AC
  Administered 2020-02-25: 20 mg via INTRAVENOUS
  Filled 2020-02-24: qty 1

## 2020-02-24 MED ORDER — WARFARIN SODIUM 2.5 MG PO TABS
2.5000 mg | ORAL_TABLET | Freq: Once | ORAL | Status: AC
  Administered 2020-02-24: 2.5 mg via ORAL
  Filled 2020-02-24: qty 1

## 2020-02-24 MED ORDER — INSULIN DETEMIR 100 UNIT/ML ~~LOC~~ SOLN
8.0000 [IU] | Freq: Every day | SUBCUTANEOUS | Status: DC
  Administered 2020-02-25 – 2020-03-01 (×6): 8 [IU] via SUBCUTANEOUS
  Filled 2020-02-24 (×8): qty 0.08

## 2020-02-24 NOTE — Evaluation (Signed)
Physical Therapy Evaluation Patient Details Name: Amy Bryant MRN: [id number] DOB: Aug 06, 1956 Today's Date: 02/24/2020   History of Present Illness  64 y.o. female admitted on Feb 27, 2020 for SOB.  Dx with COVID 19 PNA and decompensated diastolic heart failure. Pt with significant PMH of DM2, depression/anxiety, PE, PAF, CKD III, COPD (on 4 L O2 [place] at baseline), morbid obestiy, CHF, cellulitis.   Clinical Impression  Pt wants to participate, but doesn't want to get out of breath.  Pt did not have trouble mobilizing, but was unable to maintain sats at adequate levels on 35 L then 40 L when moving.  Mobilizing tends to make her cough with desire to add non-rebreather to     Follow Up Recommendations Home health PT    Equipment Recommendations  Other (comment)(TBA prior to going home)    Recommendations for Other Services       Precautions / Restrictions Precautions Precautions: Fall      Mobility  Bed Mobility Overal bed mobility: Needs Assistance Bed Mobility: Supine to Sit     Supine to sit: HOB elevated;Supervision        Transfers Overall transfer level: Needs assistance   Transfers: Sit to/from Stand;Stand Pivot Transfers Sit to Stand: Min guard Stand pivot transfers: Min assist       General transfer comment: face to face HHA pivot from bed to the chair.  Ambulation/Gait             General Gait Details: pt able to w/shift from foot to foot, but felt weak with pelvic drop bil with unilateral stance.  Stairs            Wheelchair Mobility    Modified Rankin (Stroke Patients Only)       Balance Overall balance assessment: Needs assistance   Sitting balance-Leahy Scale: Good Sitting balance - Comments: at EOB   Standing balance support: Single extremity supported;During functional activity Standing balance-Leahy Scale: Fair Standing balance comment: static standing, needs min assist for dynamic stability                              Pertinent Vitals/Pain Pain Assessment: No/denies pain    Home Living                        Prior Function                 Hand Dominance        Extremity/Trunk Assessment                Communication      Cognition Arousal/Alertness: Awake/alert Behavior During Therapy: WFL for tasks assessed/performed Overall Cognitive Status: Within Functional Limits for tasks assessed                                        General Comments General comments (skin integrity, edema, etc.): On 35% HHF at rest, pt in the low 90's.  With talking or moving to EOB, sats dropped into the low to mid 80's with some drops into the upper 70's.  With coughing, pt felt the need to add non-rebreather at 15L. for up to 5 minutes to help recover.      Exercises     Assessment/Plan    PT Assessment    PT Problem List  PT Treatment Interventions      PT Goals (Current goals can be found in the Care Plan section)  Acute Rehab PT Goals Patient Stated Goal: to go home and see her grandchildren again PT Goal Formulation: With patient Time For Goal Achievement: 03/03/20 Potential to Achieve Goals: Good    Frequency Min 3X/week   Barriers to discharge        Co-evaluation PT/OT/SLP Co-Evaluation/Treatment: Yes Reason for Co-Treatment: For patient/therapist safety PT goals addressed during session: Mobility/safety with mobility OT goals addressed during session: ADL's and self-care;Strengthening/ROM       AM-PAC PT "6 Clicks" Mobility  Outcome Measure Help needed turning from your back to your side while in a flat bed without using bedrails?: A Little Help needed moving from lying on your back to sitting on the side of a flat bed without using bedrails?: A Little Help needed moving to and from a bed to a chair (including a wheelchair)?: A Little Help needed standing up from a chair using your arms (e.g., wheelchair or bedside chair)?: A  Little Help needed to walk in hospital room?: A Little Help needed climbing 3-5 steps with a railing? : A Lot 6 Click Score: 17    End of Session Equipment Utilized During Treatment: Oxygen Activity Tolerance: Patient tolerated treatment well;Patient limited by fatigue Patient left: in chair;with call bell/phone within reach Nurse Communication: Mobility status PT Visit Diagnosis: Difficulty in walking, not elsewhere classified (R26.2)    Time: 4098-1191 PT Time Calculation (min) (ACUTE ONLY): 23 min   Charges:     PT Treatments $Therapeutic Activity: 8-22 mins        02/24/2020  [name], PT Acute Rehabilitation Services [phone]  (pager) [phone]  (office)  Tessie Fass Abdishakur Gottschall 02/24/2020, 1:48 PM

## 2020-02-24 NOTE — Progress Notes (Signed)
PROGRESS NOTE                                                                                                                                                                                                             Patient Demographics:    Amy Bryant, is a 64 y.o. female, DOB - [birth year], MVE:[id number]  Outpatient Primary MD for the patient is [name], Amy Fanny, MD   Admit date - 02/27/2020   LOS - 8  CC - SOB     Brief Narrative:  Patient is a 64 y.o. female with PMHx of COPD with chronic hypoxic respiratory failure on 4 L of oxygen at home, chronic diastolic heart failure, PAF on Coumadin, history of PE, CKD stage IIIb, non-insulin-dependent DM-2, morbid obesity-who presented to the hospital with approximately 2-3-day history of worsening shortness of breath.  For approximately 1 week-she has been having nasal congestion/URI symptoms-her PCP called in prednisone and Zithromax which she took without any relief.  In the emergency room-she was noted to have severe hypoxemia-requiring NRB-she was subsequently admitted to the hospitalist service.  Note-has not received the Covid vaccine.  Significant Events: 5/18>> admit to [facility] for severe hypoxemia requiring nonrebreather mask. 5/19>> on heated high flow 5/20>> on heated high flow+ NRB-transfer to MICU  COVID-19 medications: Steroids: 5/18>> Remdesivir: 5/18>>5/22 Actemra: 5/18 x 1  Antibiotics: None  Microbiology data: 5/18: Blood culture>> no growth  Significant studies: 5/20: Bilateral lower extremity Doppler>> negative for DVT. 5/19: Echo>> EF 60-65% 5/19: Chest x-ray>> interstitial pulmonary edema/patchy airspace opacity in the left base 5/18: Chest x-ray>> new diffuse interstitial thickening/reticular opacities suspicious for pulmonary edema  DVT prophylaxis: Coumadin-supratherapeutic INR.  Procedures: None  Consults: PCCM    Subjective:    Patient in bed, appears comfortable, denies any headache, no fever, no chest pain or pressure, improving shortness of breath , no abdominal pain. No focal weakness.    Assessment  & Plan :   Acute on chronic hypoxic Resp Failure due to Covid 19 Viral pneumonia and decompensated diastolic heart failure: She had severe disease and likely presented once her parenchymal damage was quite advanced due to COVID-19 pneumonia and inflammation, received appropriate care with IV steroids, remdesivir and Actemra.  She is currently on IV steroid taper along with IV Lasix as needed , she has finished remdesivir and Actemra treatment.  She is finally feeling better and clinically improving although still on heated high flow around 40 L with 100% FiO2 with intermittent use of nonrebreather mask but there is gradual improvement, will try to titrate her off on nonrebreather mask so that we know exactly how much oxygen she is requiring even if that means going up on heated high flow.  We will continue to monitor and adjust as needed.  She has developed a few rails with repeat IV Lasix on 02/24/2020.  Fever: afebrile  Prone/Incentive Spirometry: She is unable to prone or lie on her side-have encouraged incentive spirometry/flutter use 5 times an hour  O2 requirements:  SpO2: 95 % O2 Flow Rate (L/min): (S) 35 L/min FiO2 (%): 100 %   COVID-19 Labs: Recent Labs    02/23/20 0441  DDIMER 5.13*  CRP 0.6       Component Value Date/Time   BNP 123.8 (H) 02/24/2020 0848    Recent Labs  Lab 02/20/20 0530 02/21/20 0105 02/22/20 0656 02/23/20 0441  PROCALCITON <0.10 <0.10 <0.10 <0.10    Lab Results  Component Value Date   SARSCOV2NAA POSITIVE (A) 2020/03/12   SARSCOV2NAA NEGATIVE 10/06/2019   SARSCOV2NAA NOT DETECTED 04/08/2019   SARSCOV2NAA NOT DETECTED 04/02/2019    Decompensated acute on chronic diastolic heart failure EF 60%: IV Lasix as needed.  Elevated D-dimer: On chronic Coumadin, INR in good  range and D-dimer now trending down, lower extremity venous duplex unremarkable.  COPD: Not in exacerbation this morning-no wheezing-decrease steroids to twice daily dosing.  Continue bronchodilators.  Normally on 4 L of oxygen at all times at home.  AKI on CKD stage IIIb: Baseline creatinine around 1.8, monitor cautiously with diuresis, [place] close to baseline.  PAF with [place] vas 2 score of at least 3: Rate controlled with Cardizem-INR remains supratherapeutic-pharmacy managing Coumadin.  Cardizem dose adjusted further for better rate control.  Remote history of pulmonary embolism: on Coumadin pharmacy following.  Hypothyroidism: Continue Synthroid  Depression/anxiety: Slightly anxious due to acute illness/hypoxia/oxygen requirement-continue as needed Xanax and Prozac.  DM-2 (A1c 5.2 on 10/07/2019): CBGs stable on SSI-follow and adjust.  Recent Labs    02/23/20 2053 02/24/20 0802 02/24/20 1137  GLUCAP 341* 94 139*   Morbid Obesity: Estimated body mass index is 49.68 kg/m as calculated from the following:   Height as of this encounter: 5' 5.5" (1.664 m).   Weight as of this encounter: 137.5 kg.    GI prophylaxis: PPI  Condition - Extremely Guarded-very tenuous with risk for further deterioration  Family Communication  : Prefers that she update her family-she does not want me to call her mother or her son-she fears that her family will panic   Code Status :  Full Code  Diet :  Diet Order            Diet heart healthy/carb modified Room service appropriate? Yes; Fluid consistency: Thin; Fluid restriction: 1200 mL Fluid  Diet effective now               Disposition Plan  :   Status is: Inpatient  Remains inpatient appropriate because:Inpatient level of care appropriate due to severity of illness   Dispo: The patient is from: Home              Anticipated d/c is to: SNF              Anticipated d/c date is: > 3 days  Patient currently is not medically  stable to d/c.  Barriers to discharge: Severe Hypoxia requiring +++ O2 supplementation  Antimicorbials  :    Anti-infectives (From admission, onward)   Start     Dose/Rate Route Frequency Ordered Stop   02/17/20 1000  remdesivir 100 mg in sodium chloride 0.9 % 100 mL IVPB     100 mg 200 mL/hr over 30 Minutes Intravenous Daily March 15, 2020 2202 02/20/20 0955   Mar 15, 2020 2215  remdesivir 200 mg in sodium chloride 0.9% 250 mL IVPB     200 mg 580 mL/hr over 30 Minutes Intravenous Once 03-15-20 2202 03/15/2020 2319      Inpatient Medications  Scheduled Meds: . vitamin C  500 mg Oral Daily  . Chlorhexidine Gluconate Cloth  6 each Topical Daily  . cholecalciferol  1,000 Units Oral Daily  . diltiazem  240 mg Oral Daily  . FLUoxetine  40 mg Oral Daily  . fluticasone  1 spray Each Nare Daily  . fluticasone furoate-vilanterol  1 puff Inhalation Daily  . insulin aspart  0-5 Units Subcutaneous QHS  . insulin aspart  0-9 Units Subcutaneous TID WC  . insulin detemir  10 Units Subcutaneous BID  . iron polysaccharides  300 mg Oral Daily  . levothyroxine  175 mcg Oral Q0600  . methylPREDNISolone (SOLU-MEDROL) injection  40 mg Intravenous Daily  . pantoprazole  40 mg Oral Q1200  . senna-docusate  1 tablet Oral BID  . simvastatin  20 mg Oral QHS  . umeclidinium bromide  1 puff Inhalation Daily  . warfarin  2.5 mg Oral ONCE-1600  . Warfarin - Pharmacist Dosing Inpatient   Does not apply q1600  . zinc sulfate  220 mg Oral Daily   Continuous Infusions:  PRN Meds:.acetaminophen, albuterol, ALPRAZolam, chlorpheniramine-HYDROcodone, diltiazem, guaiFENesin-dextromethorphan, lip balm, oxymetazoline, sodium chloride, traMADol   Time Spent in minutes  35    See all Orders from today for further details   Susa Raring M.D on 02/24/2020 at 11:58 AM  To page go to [email or web address] - use universal password  [facility] -  Office  [phone]    Objective:   Vitals:   02/24/20 0930 02/24/20  0944 02/24/20 1035 02/24/20 1134  BP: (!) 130/52 (!) 130/52 126/71   Pulse: 74  73   Resp: 18  17   Temp:    97.9 F (36.6 C)  TempSrc:    Oral  SpO2: (!) 84%  95%   Weight:      Height:        Wt Readings from Last 3 Encounters:  02/23/20 (!) 137.5 kg  01/28/20 132 kg  01/06/20 132 kg     Intake/Output Summary (Last 24 hours) at 02/24/2020 1158 Last data filed at 02/24/2020 1000 Gross per 24 hour  Intake 960 ml  Output 3300 ml  Net -2340 ml     Physical Exam  Awake Alert, No new F.N deficits, Normal affect [place].AT,PERRAL Supple Neck,No JVD, No cervical lymphadenopathy appriciated.  Symmetrical Chest wall movement, Good air movement bilaterally, few rales RRR,No Gallops, Rubs or new Murmurs, No Parasternal Heave +ve B.Sounds, Abd Soft, No tenderness, No organomegaly appriciated, No rebound - guarding or rigidity. No Cyanosis, Clubbing or edema, No new Rash or bruise    Data Review:    CBC Recent Labs  Lab 02/19/20 0715 02/19/20 0715 02/20/20 0607 02/21/20 0105 02/22/20 0806 02/23/20 0441 02/24/20 0848  WBC 8.7   < > 9.4 12.0* 11.9* 11.6* 11.6*  HGB 10.8*   < >  10.8* 11.4* 11.1* 11.7* 12.2  HCT 36.0   < > 35.3* 36.8 35.8* 37.0 38.5  PLT 323   < > 319 319 305 292 200  MCV 95.2   < > 93.6 92.2 93.0 92.5 92.1  MCH 28.6   < > 28.6 28.6 28.8 29.3 29.2  MCHC 30.0   < > 30.6 31.0 31.0 31.6 31.7  RDW 13.5   < > 13.2 13.2 13.3 13.3 13.7  LYMPHSABS 0.3*  --  0.3* 0.3*  --  0.6* 0.7  MONOABS 0.2  --  0.2 0.4  --  0.5 0.6  EOSABS 0.0  --  0.0 0.0  --  0.0 0.1  BASOSABS 0.0  --  0.0 0.0  --  0.0 0.0   < > = values in this interval not displayed.    Chemistries  Recent Labs  Lab 02/19/20 0715 02/19/20 0715 02/20/20 0530 02/20/20 9604 02/21/20 0105 02/22/20 0656 02/22/20 0806 02/23/20 0441 02/24/20 0848  NA 137   < >  --  139 139  --  136 137 137  K 3.9   < >  --  4.0 4.3  --  4.3 4.3 4.3  CL 102   < >  --  102 103  --  103 101 103  CO2 26   < >  --  27  26  --  GLUCOSE 190*   < >  --  188* 176*  --  143* 104* 78  BUN 64*   < >  --  70* 73*  --  77* 71* 59*  CREATININE 2.17*   < >  --  1.98* 1.90*  --  2.10* 1.82* 1.52*  CALCIUM 9.6   < >  --  9.6 9.5  --  9.1 9.2 9.3  MG  --   --  2.2  --  2.2 2.2  --  2.4 QUANTITY NOT SUFFICIENT, UNABLE TO PERFORM TEST  AST 11*  --   --  9* 12*  --   --  13* 12*  ALT 11  --   --  10 10  --   --  11 8  ALKPHOS 66  --   --  60 61  --   --  68 74  BILITOT 0.9  --   --  1.0 0.8  --   --  0.7 QUANTITY NOT SUFFICIENT, UNABLE TO PERFORM TEST   < > = values in this interval not displayed.   ------------------------------------------------------------------------------------------------------------------ No results for input(s): CHOL, HDL, LDLCALC, TRIG, CHOLHDL, LDLDIRECT in the last 72 hours.  Lab Results  Component Value Date   HGBA1C 5.8 (H) 02/17/2020   ------------------------------------------------------------------------------------------------------------------ No results for input(s): TSH, T4TOTAL, T3FREE, THYROIDAB in the last 72 hours.  Invalid input(s): FREET3 ------------------------------------------------------------------------------------------------------------------ No results for input(s): VITAMINB12, FOLATE, FERRITIN, TIBC, IRON, RETICCTPCT in the last 72 hours.  Coagulation profile Recent Labs  Lab 02/20/20 0607 02/21/20 0105 02/22/20 0656 02/23/20 0441 02/24/20 0848  INR 2.4* 2.4* 2.3* 2.1* 2.3*    Recent Labs    02/23/20 0441  DDIMER 5.13*    Cardiac Enzymes No results for input(s): CKMB, TROPONINI, MYOGLOBIN in the last 168 hours.  Invalid input(s): CK ------------------------------------------------------------------------------------------------------------------    Component Value Date/Time   BNP 123.8 (H) 02/24/2020 0848    Micro Results Recent Results (from the past 240 hour(s))  SARS CORONAVIRUS 2 (TAT 6-24 HRS) Nasopharyngeal Nasopharyngeal  Swab     Status: Abnormal  Collection Time: 02/29/2020  5:25 PM   Specimen: Nasopharyngeal Swab  Result Value Ref Range Status   SARS Coronavirus 2 POSITIVE (A) NEGATIVE Final    Comment: RESULT CALLED TO, READ BACK BY AND VERIFIED WITH: RN S GRINDSTAFF @0028  02/17/20 BY S GEZAHEGN (NOTE) SARS-CoV-2 target nucleic acids are DETECTED. The SARS-CoV-2 RNA is generally detectable in upper and lower respiratory specimens during the acute phase of infection. Positive results are indicative of the presence of SARS-CoV-2 RNA. Clinical correlation with patient history and other diagnostic information is  necessary to determine patient infection status. Positive results do not rule out bacterial infection or co-infection with other viruses.  The expected result is Negative. Fact Sheet for Patients: 02/19/20 Fact Sheet for Healthcare Providers: HairSlick.no This test is not yet approved or cleared by the quierodirigir.com FDA and  has been authorized for detection and/or diagnosis of SARS-CoV-2 by FDA under an Emergency Use Authorization (EUA). This EUA will remain  in effect (meaning this test can be used)  for the duration of the COVID-19 declaration under Section 564(b)(1) of the Act, 21 U.S.C. section 360bbb-3(b)(1), unless the authorization is terminated or revoked sooner. Performed at [facility], [address] [place], [place]   Blood Culture (routine x 2)     Status: None   Collection Time: 02/01/2020  5:28 PM   Specimen: BLOOD  Result Value Ref Range Status   Specimen Description BLOOD RIGHT ANTECUBITAL  Final   Special Requests   Final    BOTTLES DRAWN AEROBIC AND ANAEROBIC Blood Culture adequate volume   Culture   Final    NO GROWTH 5 DAYS Performed at [facility], [address] [place], [place]    Report Status 02/21/2020 FINAL  Final  Blood Culture (routine x 2)     Status: None    Collection Time: 02/26/2020  5:47 PM   Specimen: BLOOD LEFT FOREARM  Result Value Ref Range Status   Specimen Description BLOOD LEFT FOREARM  Final   Special Requests   Final    BOTTLES DRAWN AEROBIC AND ANAEROBIC Blood Culture adequate volume   Culture   Final    NO GROWTH 5 DAYS Performed at [facility], [address], [place], [place]    Report Status 02/21/2020 FINAL  Final    Radiology Reports DG Chest Port 1 View  Result Date: 02/23/2020 CLINICAL DATA:  COVID pneumonia. EXAM: PORTABLE CHEST 1 VIEW COMPARISON:  One-view chest x-ray 02/20/2020 FINDINGS: Heart is enlarged. Atherosclerotic calcifications are present at the aortic arch. Diffuse interstitial and airspace disease is present. Consolidation at the left base is slightly less dense. A diffuse interstitial pattern has increased some. IMPRESSION: 1. Diffuse interstitial and airspace disease compatible with pneumonia. An element of edema is not excluded. 2. Consolidation at the left base is slightly less dense. Electronically Signed   By: 02/22/2020 M.D.   On: 02/23/2020 08:49   DG Chest Port 1 View  Result Date: 02/20/2020 CLINICAL DATA:  Shortness of breath, history of COVID-19 positivity EXAM: PORTABLE CHEST 1 VIEW COMPARISON:  02/17/2020 FINDINGS: Cardiac shadow is stable but enlarged. Aortic calcifications are seen. Patchy opacities are noted primarily in the left base consistent with the given clinical history. Mild vascular congestion is noted centrally. No other focal abnormality is seen. IMPRESSION: Mild vascular congestion. Persistent opacity in the left base consistent with the given clinical history. Electronically Signed   By: 02/19/2020  M.D.   On: 02/20/2020 08:29   DG Chest Port 1 View  Result Date: 2020-03-15 CLINICAL DATA:  Dyspnea, fever. EXAM: PORTABLE CHEST 1 VIEW COMPARISON:  Radiograph 10/06/2019 FINDINGS: Significant interval change from prior exam with diffuse interstitial  thickening and reticular opacities. There is also perivascular haziness. Cardiomegaly with unchanged mediastinal contours. Aortic atherosclerosis. Possible small pleural effusions. No visualized pneumothorax. IMPRESSION: New diffuse interstitial thickening and reticular opacities suspicious for pulmonary edema. Similar cardiomegaly. Possible small pleural effusions. Findings suspicious for CHF. Electronically Signed   By: Narda Rutherford M.D.   On: 03/15/2020 18:11   DG Chest Port 1V same Day  Result Date: 02/17/2020 CLINICAL DATA:  Shortness of breath EXAM: PORTABLE CHEST 1 VIEW COMPARISON:  03/15/2020 FINDINGS: There is cardiomegaly with pulmonary venous hypertension. There is interstitial edema with patchy airspace opacity in the left base. No adenopathy. There is aortic atherosclerosis. No bone lesions. IMPRESSION: Cardiomegaly with pulmonary vascular congestion. There is interstitial pulmonary edema. Suspect a degree of congestive heart failure. Patchy airspace opacity in the left base is concerning for superimposed pneumonia, although alveolar edema in this area could present in this manner. Both pneumonia and alveolar edema could present concurrently. In comparison with 1 day prior, there appears to be somewhat less interstitial edema. Aortic Atherosclerosis (ICD10-I70.0). Electronically Signed   By: Bretta Bang III M.D.   On: 02/17/2020 08:01   ECHOCARDIOGRAM COMPLETE  Result Date: 02/17/2020    ECHOCARDIOGRAM REPORT   Patient Name:   MASIEL GENTZLER Date of Exam: 02/17/2020 Medical Rec #:  [id number]      Height:       65.5 in Accession #:    [id number]     Weight:       290.3 lb Date of Birth:  1955-12-24      BSA:          2.331 m Patient Age:    63 years       BP:           120/67 mmHg Patient Gender: F              HR:           75 bpm. Exam Location:  Inpatient Procedure: 2D Echo Indications:    CHF 428  History:        Patient has prior history of Echocardiogram examinations, most                  recent 01/09/2019. CHF, COPD, Arrythmias:Atrial Fibrillation;                 Risk Factors:Diabetes and Former Smoker. Covid +. cor pulmonale.                 pulmonary embolism.  Sonographer:    Celene Skeen RDCS (AE) Referring Phys: [id number] John Giovanni  Sonographer Comments: Patient is morbidly obese. Image acquisition challenging due to patient body habitus and Image acquisition challenging due to respiratory motion. limited mobility IMPRESSIONS  1. Left ventricular ejection fraction, by estimation, is 60 to 65%. The left ventricle has normal function. The left ventricle has no regional wall motion abnormalities. Left ventricular diastolic parameters are indeterminate.  2. Right ventricular systolic function was not well visualized. The right ventricular size is not well visualized.  3. Left atrial size was moderately dilated.  4. Right atrial size was mild to moderately dilated.  5. The mitral valve is normal in structure. No evidence of mitral valve regurgitation.  No evidence of mitral stenosis.  6. The aortic valve was not well visualized. Aortic valve regurgitation is trivial. Mild to moderate aortic valve sclerosis/calcification is present, without any evidence of aortic stenosis.  7. The inferior vena cava not well seen. FINDINGS  Left Ventricle: Left ventricular ejection fraction, by estimation, is 60 to 65%. The left ventricle has normal function. The left ventricle has no regional wall motion abnormalities. The left ventricular internal cavity size was normal in size. There is  no left ventricular hypertrophy. Left ventricular diastolic parameters are indeterminate. Right Ventricle: The right ventricular size is not well visualized. Right vetricular wall thickness was not assessed. Right ventricular systolic function was not well visualized. Left Atrium: Left atrial size was moderately dilated. Right Atrium: Right atrial size was mild to moderately dilated. Pericardium: There is no  evidence of pericardial effusion. Mitral Valve: The mitral valve is normal in structure. There is mild thickening of the mitral valve leaflet(s). There is mild calcification of the mitral valve leaflet(s). Normal mobility of the mitral valve leaflets. Moderate mitral annular calcification. No evidence of mitral valve regurgitation. No evidence of mitral valve stenosis. Tricuspid Valve: The tricuspid valve is normal in structure. Tricuspid valve regurgitation is mild . No evidence of tricuspid stenosis. Aortic Valve: The aortic valve was not well visualized. Aortic valve regurgitation is trivial. Mild to moderate aortic valve sclerosis/calcification is present, without any evidence of aortic stenosis. Pulmonic Valve: The pulmonic valve was normal in structure. Pulmonic valve regurgitation is not visualized. No evidence of pulmonic stenosis. Aorta: The aortic root is normal in size and structure. Venous: The inferior vena cava not well seen. IAS/Shunts: The interatrial septum was not well visualized.  LEFT VENTRICLE PLAX 2D LVIDd:         5.30 cm LVIDs:         3.40 cm LV PW:         1.10 cm LV IVS:        1.00 cm LVOT diam:     1.80 cm LV SV:         38 LV SV Index:   16 LVOT Area:     2.54 cm  RIGHT VENTRICLE RV S prime:     14.30 cm/s TAPSE (M-mode): 1.9 cm LEFT ATRIUM           Index       RIGHT ATRIUM           Index LA diam:      4.80 cm 2.06 cm/m  RA Area:     17.70 cm LA Vol (A4C): 44.0 ml 18.88 ml/m RA Volume:   45.20 ml  19.39 ml/m  AORTIC VALVE LVOT Vmax:   83.90 cm/s LVOT Vmean:  59.500 cm/s LVOT VTI:    0.150 m  AORTA Ao Root diam: 3.40 cm MITRAL VALVE MV Area (PHT): 3.48 cm    SHUNTS MV Decel Time: 218 msec    Systemic VTI:  0.15 m MV E velocity: 73.70 cm/s  Systemic Diam: 1.80 cm Charlton Haws MD Electronically signed by Charlton Haws MD Signature Date/Time: 02/17/2020/3:39:01 PM    Final    VAS [place] LOWER EXTREMITY VENOUS (DVT)  Result Date: 02/18/2020  Lower Venous DVTStudy Indications: Swelling,  and Edema.  Comparison Study: no prior Performing Technologist: Blanch Media RVS  Examination Guidelines: A complete evaluation includes B-mode imaging, spectral Doppler, color Doppler, and power Doppler as needed of all accessible portions of each vessel. Bilateral testing is considered an integral part  of a complete examination. Limited examinations for reoccurring indications may be performed as noted. The reflux portion of the exam is performed with the patient in reverse Trendelenburg.  +---------+---------------+---------+-----------+----------+--------------+ RIGHT    CompressibilityPhasicitySpontaneityPropertiesThrombus Aging +---------+---------------+---------+-----------+----------+--------------+ CFV      Full           Yes      Yes                                 +---------+---------------+---------+-----------+----------+--------------+ SFJ      Full                                                        +---------+---------------+---------+-----------+----------+--------------+ FV Prox  Full                                                        +---------+---------------+---------+-----------+----------+--------------+ FV Mid   Full                                                        +---------+---------------+---------+-----------+----------+--------------+ FV DistalFull                                                        +---------+---------------+---------+-----------+----------+--------------+ PFV      Full                                                        +---------+---------------+---------+-----------+----------+--------------+ POP      Full           Yes      Yes                                 +---------+---------------+---------+-----------+----------+--------------+ PTV      Full                                                        +---------+---------------+---------+-----------+----------+--------------+ PERO      Full                                                        +---------+---------------+---------+-----------+----------+--------------+   +---------+---------------+---------+-----------+----------+--------------+ LEFT     CompressibilityPhasicitySpontaneityPropertiesThrombus Aging +---------+---------------+---------+-----------+----------+--------------+ CFV      Full  Yes      Yes                                 +---------+---------------+---------+-----------+----------+--------------+ SFJ      Full                                                        +---------+---------------+---------+-----------+----------+--------------+ FV Prox  Full                                                        +---------+---------------+---------+-----------+----------+--------------+ FV Mid   Full                                                        +---------+---------------+---------+-----------+----------+--------------+ FV DistalFull                                                        +---------+---------------+---------+-----------+----------+--------------+ PFV      Full                                                        +---------+---------------+---------+-----------+----------+--------------+ POP      Full           Yes      Yes                                 +---------+---------------+---------+-----------+----------+--------------+ PTV      Full                                                        +---------+---------------+---------+-----------+----------+--------------+ PERO     Full                                                        +---------+---------------+---------+-----------+----------+--------------+     Summary: BILATERAL: - No evidence of deep vein thrombosis seen in the lower extremities, bilaterally. -   *See table(s) above for measurements and observations. Electronically signed by Sherald Hesshristopher Clark  MD on 02/18/2020 at 5:31:51 PM.    Final

## 2020-02-24 NOTE — TOC Initial Note (Signed)
Transition of Care [facility]) - Initial/Assessment Note    Patient Details  Name: Amy Bryant MRN: [id number] Date of Birth: 1956-10-01  Transition of [facility]) CM/SW Contact:    Lawerance Sabal, RN Phone Number: 02/24/2020, 11:29 AM  Clinical Narrative:                Patient currently in ICU, very dyspneic w exertion. TOC will continue to follow, and discuss HH follow up closer to discharge when needs at that time can be more accurately assessed.     Expected Discharge Plan: Home w Home Health Services Barriers to Discharge: Continued Medical Work up   Patient Goals and CMS Choice        Expected Discharge Plan and Services Expected Discharge Plan: Home w Home Health Services                                              Prior Living Arrangements/Services   Lives with:: Spouse                   Activities of Daily Living Home Assistive Devices/Equipment: Wheelchair, Oxygen, Grab bars in shower, Grab bars around toilet ADL Screening (condition at time of admission) Patient's cognitive ability adequate to safely complete daily activities?: Yes Is the patient deaf or have difficulty hearing?: No Does the patient have difficulty seeing, even when wearing glasses/contacts?: No Does the patient have difficulty concentrating, remembering, or making decisions?: No Patient able to express need for assistance with ADLs?: Yes Does the patient have difficulty dressing or bathing?: No Independently performs ADLs?: Yes (appropriate for developmental age) Does the patient have difficulty walking or climbing stairs?: Yes Weakness of Legs: Both Weakness of Arms/Hands: None  Permission Sought/Granted                  Emotional Assessment              Admission diagnosis:  Chronic obstructive pulmonary disease with acute exacerbation (HCC) [J44.1] Congestive heart failure, unspecified HF chronicity, unspecified heart failure type (HCC) [I50.9] Person under  investigation for COVID-19 [Z20.822] Pneumonia due to COVID-19 virus [U07.1, J12.82] Patient Active Problem List   Diagnosis Date Noted  . Acute exacerbation of CHF (congestive heart failure) (HCC) 02/17/2020  . COPD with acute exacerbation (HCC) 02/17/2020  . Pneumonia due to COVID-19 virus Feb 19, 2020  . Recurrent UTI 01/06/2020  . Discoloration and thickening of nails both feet 11/12/2019  . CKD (chronic kidney disease) stage 3, GFR 30-59 ml/min 07/31/2019  . Long term (current) use of anticoagulants 06/04/2019  . Primary hypercoagulable state (HCC) 06/04/2019  . Acute on chronic diastolic CHF (congestive heart failure) (HCC) 04/11/2019  . Acute on chronic respiratory failure (HCC) 03/28/2019  . Panic attacks 01/20/2019  . Iron deficiency anemia: Severe 01/14/2019  . Symptomatic anemia   . Coagulopathy (HCC)   . Chronic anticoagulation   . Absolute anemia 01/08/2019  . Reactive depression (situational) 12/29/2018  . Mobility impaired 12/12/2018  . Hematuria 12/03/2017  . Pedal edema 12/23/2016  . OSA (obstructive sleep apnea) 12/21/2016  . Financial difficulties 09/16/2016  . Colon cancer screening 12/15/2013  . Routine general medical examination at a health care facility 05/20/2012  . Atrial fibrillation (HCC) 11/23/2010  . Vitamin D deficiency 11/15/2010  . DYSURIA, HX OF 08/19/2009  . PURE HYPERCHOLESTEROLEMIA 08/16/2009  . Diabetes type 2,  controlled ([place]) 08/13/2008  . Hyperparathyroidism ([place]) 07/30/2008  . Hypothyroidism 02/13/2008  . Morbid obesity ([place]) 02/04/2008  . Reactive airway disease 02/04/2008  . GERD 02/04/2008  . [place] DISEASE, LUMBAR 02/04/2008  . TOBACCO USE, QUIT 02/04/2008  . Obesity hypoventilation syndrome ([place]) 08/11/2007  . Chronic pulmonary heart disease ([place]) 08/11/2007   PCP:  Abner Greenspan, MD Pharmacy:   [id number] - [place], [place] - [address] [place]  [address] Phone: [phone] Fax: [phone]  [facility] - [place] [place] [address] Phone: [phone] Fax: [phone]     Social Determinants of Health (SDOH) Interventions    Readmission Risk Interventions No flowsheet data found.

## 2020-02-24 NOTE — Progress Notes (Signed)
Occupational Therapy Treatment Patient Details Name: Amy Bryant MRN: [id number] DOB: 1956-08-10 Today's Date: 02/24/2020    History of present illness 64 y.o. female admitted on 2020/03/08 for SOB.  Dx with COVID 19 PNA and decompensated diastolic heart failure. Pt with significant PMH of DM2, depression/anxiety, PE, PAF, CKD III, COPD (on 4 L O2 [place] at baseline), morbid obestiy, CHF, cellulitis.    OT comments  RN had prepared pt that therapy would come to get her OOB for lunch. Pt initially stating she wanted to bathe before getting up, but easily agreeable to working with therapies. Moderate assistance for LB dressing and UB bathing. Pt needing several minute recovery after transfer to chair and use of NRB in addition to HFNC. Completed oral care once seated in chair. Pt reports being faithful to her UE exercise program and use of incentive spirometer.   Follow Up Recommendations  Home health OT    Equipment Recommendations  None recommended by OT    Recommendations for Other Services      Precautions / Restrictions Precautions Precautions: Fall Precaution Comments: desats with minimal exertion, supplements [place] with NRB       Mobility Bed Mobility Overal bed mobility: Needs Assistance Bed Mobility: Supine to Sit     Supine to sit: HOB elevated;Supervision     General bed mobility comments: no difficulty  Transfers Overall transfer level: Needs assistance   Transfers: Sit to/from Stand;Stand Pivot Transfers Sit to Stand: Min guard Stand pivot transfers: Min assist       General transfer comment: hand held assist to pivot bed to chair    Balance Overall balance assessment: Needs assistance   Sitting balance-Leahy Scale: Good Sitting balance - Comments: at EOB   Standing balance support: Single extremity supported;During functional activity Standing balance-Leahy Scale: Fair Standing balance comment: static standing, needs min assist for dynamic stability                           ADL either performed or assessed with clinical judgement   ADL Overall ADL's : Needs assistance/impaired     Grooming: Oral care;Sitting;Set up   Upper Body Bathing: Moderate assistance;Sitting Upper Body Bathing Details (indicate cue type and reason): back and under arms         Lower Body Dressing: Bed level;Moderate assistance Lower Body Dressing Details (indicate cue type and reason): assist for sock on L foot               General ADL Comments: Pt cued to slow pace and allow for intermittent rest breaks. Movement tends to trigger coughing episodes requiring addition of NRB to [place] to recover.     Vision       Perception     Praxis      Cognition Arousal/Alertness: Awake/alert Behavior During Therapy: WFL for tasks assessed/performed Overall Cognitive Status: Within Functional Limits for tasks assessed                                          Exercises     Shoulder Instructions       General Comments On 35% HHF at rest, pt in the low 90's.  With talking or moving to EOB, sats dropped into the low to mid 80's with some drops into the upper 70's.  With coughing, pt felt the need to add  non-rebreather at 15L. for up to 5 minutes to help recover.      Pertinent Vitals/ Pain       Pain Assessment: No/denies pain  Home Living                                          Prior Functioning/Environment              Frequency  Min 2X/week        Progress Toward Goals  OT Goals(current goals can now be found in the care plan section)  Progress towards OT goals: Progressing toward goals  Acute Rehab OT Goals Patient Stated Goal: to go home and see her grandchildren again OT Goal Formulation: With patient Time For Goal Achievement: 03/04/20 Potential to Achieve Goals: Good  Plan Discharge plan remains appropriate    Co-evaluation      Reason for Co-Treatment: For patient/therapist  safety PT goals addressed during session: Mobility/safety with mobility OT goals addressed during session: ADL's and self-care      AM-PAC OT "6 Clicks" Daily Activity     Outcome Measure   Help from another person eating meals?: None Help from another person taking care of personal grooming?: A Little Help from another person toileting, which includes using toliet, bedpan, or urinal?: A Little Help from another person bathing (including washing, rinsing, drying)?: A Lot Help from another person to put on and taking off regular upper body clothing?: None Help from another person to put on and taking off regular lower body clothing?: A Lot 6 Click Score: 18    End of Session Equipment Utilized During Treatment: Oxygen(NRB and HFNC)  OT Visit Diagnosis: Unsteadiness on feet (R26.81);Other abnormalities of gait and mobility (R26.89)   Activity Tolerance Patient limited by fatigue   Patient Left in chair;with call bell/phone within reach   Nurse Communication Other (comment)(replaced purewick)        Time: 4401-0272 OT Time Calculation (min): 23 min  Charges: OT General Charges $OT Visit: 1 Visit OT Treatments $Self Care/Home Management : 8-22 mins  Martie Round, OTR/L Acute Rehabilitation Services Pager: [phone] Office: [phone]   Evern Bio 02/24/2020, 2:04 PM

## 2020-02-24 NOTE — Progress Notes (Signed)
NAME:  Amy Bryant, MRN:  [id number], DOB:  [birth year], LOS: 8 ADMISSION DATE:  03/26/2020, CONSULTATION DATE: 02/18/2020 REFERRING MD:  Jeoffrey Massed, CHIEF COMPLAINT: COVID-19 pneumonia  Brief History   64 year old with past medical history including COPD, CKD, chronic respiratory failure presenting with COVID-19 pneumonia on 5/18. Treated with steroids, remdesivir and Actemra. PCCM consulted for worsening hypoxia  Past Medical History  COPD with chronic hypoxic respiratory failure requiring 4 L [place] supplemental oxygen at all times Chronic diastolic congestive heart failure PAF on Coumadin Prior history of PE CKD stage IIIb Noninsulin-dependent type 2 diabetes Morbid obesity Reactive airway dysfunction  Osteoarthritis  Hypothyroidism GERD  Significant Hospital Events   5/19 Admit 5/20 Transfer to ICU 5/24 Remains on HHF and NRB  Consults:  PCCM  Procedures:    Significant Diagnostic Tests:  Chest x-ray 5/20-cardiomegaly, patchy airspace disease, vascular congestion  Echo 5/19-LVEF 60 to 65%, moderate LA, RA dilation  Micro Data:  5/18 sars2: positive 5/18 blood: negative Antimicrobials/COVID Rx  Solumedrol5/18>> Remdesivir:5/18>> 5/23 Actemra: 5/18 x 1  Interim history/subjective:  5/26: remains on 100%hhfnc 35L. Intermittent NRB use to recover. Pt states she  Worked with PT yesterday. Ate breakfast. No BM today but if she does not have one is amendable to stool softener. Participatory in [employer].  5/25: on 40L and 100% hhfnc, just seen after coughing and utilizing flutter valve and sats stuck in high 70's- 80. Placed on NRB with improvement to mid 80's. Pt states did not rest well overnight, feels anxious. encouraged to [place] NRB less and short intervals if necessary to recover when eating, coughing etc. She is frustrated with the HHFNC due to nare irritation.  5/24:Continues to have high O2 requirements.  Overall states that she is feeling better.   Objective   Blood pressure (!) 86/64, pulse 67, temperature 97.8 F (36.6 C), temperature source Oral, resp. rate 16, height 5' 5.5" (1.664 m), weight (!) 137.5 kg, SpO2 94 %.    FiO2 (%):  [90 %-100 %] 100 %   Intake/Output Summary (Last 24 hours) at 02/24/2020 0749 Last data filed at 02/24/2020 0600 Gross per 24 hour  Intake 960 ml  Output 2700 ml  Net -1740 ml   Filed Weights   02/21/20 0600 02/22/20 0500 02/23/20 0500  Weight: 132.9 kg (!) 137.4 kg (!) 137.5 kg    Examination: Gen:      No acute distress, obese, pleasant, sitting up in bed. Eating breakfast HEENT:  NCAT, EOMI, sclera anicteric Neck:     No masses; no thyromegaly Lungs:    Clear to auscultation L, rhonchi R; normal respiratory effort with some mild tachypnea  CV:         Regular rate and rhythm; no murmurs Abd:      + bowel sounds; soft, non-tender; no palpable masses, no distension Ext:    No edema; adequate peripheral perfusion Skin:      Warm and dry; no rash Neuro: alert and oriented x 3   Resolved Hospital Problem list     Assessment & Plan:  Severe ARDS secondary to COVID-19 pneumonia Baseline COPD on 4 L oxygen. Continue HHFNC and oxygen support sat >85% -intermittent use of NRB for short periods -Encourage self prone -Incentive spirometer, mobilize, pulmonary hygiene -Continue monitoring in ICU  CHF Paroxysmal atrial fibrillation on Coumadin anticoagulation as outpatient -Diuresis as tolerated based on renal function  Continue Cardizem Following INR, Coumadin dosing per pharmacy   Chronic kidney disease, AKI Follow urine  output -renal indices, stable to slightly improved at last check -Avoid nephrotoxins, renal dose medications  Diabetes -Sliding-scale insulin, -Levemir 10 units twice daily -bs extremely variable  Hypothyroidism- last TSH 5, 04/2019 Continue Synthroid  Best practice:  Diet: PO diet Pain/Anxiety/Delirium protocol (if indicated): NA VAP protocol (if indicated):  NA DVT prophylaxis: Coumadin GI prophylaxis: PPI Glucose control: see above Mobility: Bed Code Status: Full Family Communication: Per primary Disposition: ICU   care time 35 mins. This represents my time independent of the NPs time taking care of the pt. This is excluding procedures.    [facility] 02/24/2020, 7:49 AM

## 2020-02-24 NOTE — Progress Notes (Signed)
ANTICOAGULATION CONSULT NOTE  Pharmacy Consult for Coumadin Indication: history of atrial fibrillation and PE  Allergies  Allergen Reactions  . Pseudoephedrine Other (See Comments)    REACTION: tightness in chest  . Augmentin [Amoxicillin-Pot Clavulanate] Itching and Rash    Did it involve swelling of the face/tongue/throat, SOB, or low BP? No Did it involve sudden or severe rash/hives, skin peeling, or any reaction on the inside of your mouth or nose? Yes Did you need to seek medical attention at a hospital or doctor's office? Yes When did it last happen?2019 If all above answers are "NO", may proceed with cephalosporin use.   . Latex Itching and Rash    Patient Measurements: Height: 5' 5.5" (166.4 cm) Weight: (!) 137.5 kg (303 lb 2.1 oz) IBW/kg (Calculated) : 58.15  Vital Signs: Temp: 97.8 F (36.6 C) (05/26 0800) Temp Source: Oral (05/26 0758) BP: 130/52 (05/26 0944) Pulse Rate: 71 (05/26 0800)  Labs: Recent Labs    02/22/20 0656 02/22/20 0806 02/22/20 0806 02/23/20 0441 02/24/20 0848  HGB  --  11.1*   < > 11.7* 12.2  HCT  --  35.8*  --  37.0 38.5  PLT  --  305  --  292 200  LABPROT 24.9*  --   --  22.6* 24.4*  INR 2.3*  --   --  2.1* 2.3*  CREATININE  --  2.10*  --  1.82*  --    < > = values in this interval not displayed.    Estimated Creatinine Clearance: 44.3 mL/min (A) (by C-G formula based on SCr of 1.82 mg/dL (H)).   Assessment: 55 YOF admitted with Covid, to continue Coumadin for history of Afib and PE.  Patient had [facility] clinic visit on 02/23/2020 with INR at 4.4.  INR therapeutic at 2.3. CBC is stable.  No bleeding reported. Intake 60-100% recorded - appetite fair per RNs.  Home Coumadin dose: 2.5mg  daily except 5mg  on Mon  Goal of Therapy:  INR 2-3   Plan:   Coumadin 2.5mg  PO today as per home regimen Daily PT / INR  Thu, PharmD, BCPS, BCCCP Clinical Pharmacist Please refer to [facility] for [facility] Pharmacy numbers 02/24/2020, 10:26  AM

## 2020-02-25 LAB — CBC WITH DIFFERENTIAL/PLATELET
Abs Immature Granulocytes: 0.2 10*3/uL — ABNORMAL HIGH (ref 0.00–0.07)
Basophils Absolute: 0 10*3/uL (ref 0.0–0.1)
Basophils Relative: 0 %
Eosinophils Absolute: 0 10*3/uL (ref 0.0–0.5)
Eosinophils Relative: 0 %
HCT: 34.7 % — ABNORMAL LOW (ref 36.0–46.0)
Hemoglobin: 11.1 g/dL — ABNORMAL LOW (ref 12.0–15.0)
Immature Granulocytes: 1 %
Lymphocytes Relative: 4 %
Lymphs Abs: 0.5 10*3/uL — ABNORMAL LOW (ref 0.7–4.0)
MCH: 29.6 pg (ref 26.0–34.0)
MCHC: 32 g/dL (ref 30.0–36.0)
MCV: 92.5 fL (ref 80.0–100.0)
Monocytes Absolute: 0.6 10*3/uL (ref 0.1–1.0)
Monocytes Relative: 4 %
Neutro Abs: 13.3 10*3/uL — ABNORMAL HIGH (ref 1.7–7.7)
Neutrophils Relative %: 91 %
Platelets: 206 10*3/uL (ref 150–400)
RBC: 3.75 MIL/uL — ABNORMAL LOW (ref 3.87–5.11)
RDW: 13.6 % (ref 11.5–15.5)
WBC: 14.6 10*3/uL — ABNORMAL HIGH (ref 4.0–10.5)
nRBC: 0 % (ref 0.0–0.2)

## 2020-02-25 LAB — GLUCOSE, CAPILLARY
Glucose-Capillary: 90 mg/dL (ref 70–99)
Glucose-Capillary: 99 mg/dL (ref 70–99)
Glucose-Capillary: 218 mg/dL — ABNORMAL HIGH (ref 70–99)
Glucose-Capillary: 275 mg/dL — ABNORMAL HIGH (ref 70–99)

## 2020-02-25 LAB — COMPREHENSIVE METABOLIC PANEL
ALT: 12 U/L (ref 0–44)
AST: 11 U/L — ABNORMAL LOW (ref 15–41)
Albumin: 2.7 g/dL — ABNORMAL LOW (ref 3.5–5.0)
Alkaline Phosphatase: 68 U/L (ref 38–126)
Anion gap: 6 (ref 5–15)
BUN: 57 mg/dL — ABNORMAL HIGH (ref 8–23)
CO2: 29 mmol/L (ref 22–32)
Calcium: 9.5 mg/dL (ref 8.9–10.3)
Chloride: 103 mmol/L (ref 98–111)
Creatinine, Ser: 1.65 mg/dL — ABNORMAL HIGH (ref 0.44–1.00)
GFR calc Af Amer: 38 mL/min — ABNORMAL LOW (ref >60)
GFR calc non Af Amer: 32 mL/min — ABNORMAL LOW (ref >60)
Glucose, Bld: 131 mg/dL — ABNORMAL HIGH (ref 70–99)
Potassium: 4.8 mmol/L (ref 3.5–5.1)
Sodium: 138 mmol/L (ref 135–145)
Total Bilirubin: 0.4 mg/dL (ref 0.3–1.2)
Total Protein: 5.4 g/dL — ABNORMAL LOW (ref 6.5–8.1)

## 2020-02-25 LAB — MAGNESIUM: Magnesium: 2.2 mg/dL (ref 1.7–2.4)

## 2020-02-25 LAB — BRAIN NATRIURETIC PEPTIDE: B Natriuretic Peptide: 142.3 pg/mL — ABNORMAL HIGH (ref 0.0–100.0)

## 2020-02-25 LAB — PROTIME-INR
INR: 3.1 — ABNORMAL HIGH (ref 0.8–1.2)
Prothrombin Time: 30.9 seconds — ABNORMAL HIGH (ref 11.4–15.2)

## 2020-02-25 MED ORDER — WARFARIN SODIUM 1 MG PO TABS
1.0000 mg | ORAL_TABLET | Freq: Once | ORAL | Status: AC
  Administered 2020-02-25: 1 mg via ORAL
  Filled 2020-02-25: qty 1

## 2020-02-25 MED ORDER — DOCUSATE SODIUM 100 MG PO CAPS: 100.0000 mg | ORAL_CAPSULE | Freq: Two times a day (BID) | ORAL | Status: DC | PRN

## 2020-02-25 MED ORDER — DILTIAZEM HCL ER COATED BEADS 180 MG PO CP24
180.0000 mg | ORAL_CAPSULE | Freq: Every day | ORAL | Status: DC
  Administered 2020-02-26: 180 mg via ORAL
  Filled 2020-02-25 (×2): qty 1

## 2020-02-25 NOTE — Progress Notes (Signed)
NAME:  [name], MRN:  [id number], DOB:  05/08/1956, LOS: 9 ADMISSION DATE:  02/15/2020, CONSULTATION DATE: 02/18/2020 REFERRING MD:  Oren Binet, CHIEF COMPLAINT: COVID-19 pneumonia  Brief History   64 year old with past medical history including COPD, CKD, chronic respiratory failure presenting with COVID-19 pneumonia on 5/18. Treated with steroids, remdesivir and Actemra. PCCM consulted for worsening hypoxia  Past Medical History  COPD with chronic hypoxic respiratory failure requiring 4 L [place] supplemental oxygen at all times Chronic diastolic congestive heart failure PAF on Coumadin Prior history of PE CKD stage IIIb Noninsulin-dependent type 2 diabetes Morbid obesity Reactive airway dysfunction  Osteoarthritis  Hypothyroidism GERD  Significant Hospital Events   5/19 Admit 5/20 Transfer to ICU 5/24 Remains on HHF and NRB  Consults:  PCCM  Procedures:    Significant Diagnostic Tests:  Chest x-ray 5/20-cardiomegaly, patchy airspace disease, vascular congestion  Echo 5/19-LVEF 60 to 65%, moderate LA, RA dilation  Micro Data:  5/18 sars2: positive 5/18 blood: negative Antimicrobials/COVID Rx  Solumedrol5/18>> Remdesivir:5/18>> 5/23 Actemra: 5/18 x 1  Interim history/subjective:  5/27: remains on 35L and 100% with intermittent NRB use. Great uop but cr and bicarb up some today. Suspect close to euvolemia in light of this. Afebrile, primary weaning steroids. Pt reports small bm overnight, will add stool softener 5/26: remains on 100%hhfnc 35L. Intermittent NRB use to recover. Pt states she  Worked with PT yesterday. Ate breakfast. No BM today but if she does not have one is amendable to stool softener. Participatory in [employer].  5/25: on 40L and 100% hhfnc, just seen after coughing and utilizing flutter valve and sats stuck in high 70's- 80. Placed on NRB with improvement to mid 80's. Pt states did not rest well overnight, feels anxious. encouraged to [place]  NRB less and short intervals if necessary to recover when eating, coughing etc. She is frustrated with the [place] due to nare irritation.  5/24:Continues to have high O2 requirements.  Overall states that she is feeling better.  Objective   Blood pressure 124/66, pulse 72, temperature 97.8 F (36.6 C), temperature source Oral, resp. rate 18, height 5' 5.5" (1.664 m), weight (!) 137.5 kg, SpO2 (!) 89 %.    FiO2 (%):  [100 %] 100 %   Intake/Output Summary (Last 24 hours) at 02/25/2020 0803 Last data filed at 02/25/2020 0400 Gross per 24 hour  Intake 1080 ml  Output 2950 ml  Net -1870 ml   Filed Weights   02/21/20 0600 02/22/20 0500 02/23/20 0500  Weight: 132.9 kg (!) 137.4 kg (!) 137.5 kg    Examination: Gen:      No acute distress, obese, pleasant, sitting up in bed. HEENT:  NCAT, EOMI, sclera anicteric Neck:     No masses; no thyromegaly Lungs:    Clear to auscultation bilaterally; normal respiratory effort  CV:         Regular rate and rhythm; no murmurs Abd:      + bowel sounds; soft, non-tender; no palpable masses, no distension Ext:    No edema; adequate peripheral perfusion Skin:      Warm and dry; no rash Neuro: alert and oriented x 3   Resolved Hospital Problem list     Assessment & Plan:  Severe ARDS secondary to COVID-19 pneumonia Baseline COPD on 4 L oxygen. Continue HHFNC and oxygen support sat >85% -intermittent use of NRB for short periods for recovery -Encourage self prone -Incentive spirometer, mobilize, pulmonary hygiene -Continue monitoring in ICU  CHF Paroxysmal atrial fibrillation on Coumadin anticoagulation as outpatient -Diuresis as tolerated, suspect close to euvolemia (of course per I/O net negative 10L for hospital stay although weight up 5kg so there is a disconnect) Continue Cardizem Following INR, Coumadin dosing per pharmacy   Chronic kidney disease, AKI Follow urine output, great with diuresis -renal indices, up ever so slightly today and  bicarb up to 29 as well.  -Avoid nephrotoxins, renal dose medications  Diabetes -Sliding-scale insulin, -Levemir 10 units twice daily -bs extremely variable  Hypothyroidism- last TSH 5, 04/2019 Continue Synthroid  Constipation:  -adding prn stool softener  Best practice:  Diet: PO diet Pain/Anxiety/Delirium protocol (if indicated): NA VAP protocol (if indicated): NA DVT prophylaxis: Coumadin GI prophylaxis: PPI Glucose control: see above Mobility: Bed Code Status: Full Family Communication: Per primary Disposition: ICU   care time 35 mins. This represents my time independent of the NPs time taking care of the pt. This is excluding procedures.    Briant Sites DO [facility] 02/25/2020, 8:03 AM

## 2020-02-25 NOTE — Progress Notes (Signed)
PROGRESS NOTE                                                                                                                                                                                                             Patient Demographics:    Amy Bryant, is a 64 y.o. female, DOB - [birth year], PFX:[id number]  Outpatient Primary MD for the patient is [name], [name], MD   Admit date - 2020/03/05   LOS - 9  CC - SOB     Brief Narrative:  Patient is a 64 y.o. female with PMHx of COPD with chronic hypoxic respiratory failure on 4 L of oxygen at home, chronic diastolic heart failure, PAF on Coumadin, history of PE, CKD stage IIIb, non-insulin-dependent DM-2, morbid obesity-who presented to the hospital with approximately 2-3-day history of worsening shortness of breath.  For approximately 1 week-she has been having nasal congestion/URI symptoms-her PCP called in prednisone and Zithromax which she took without any relief.  In the emergency room-she was noted to have severe hypoxemia-requiring NRB-she was subsequently admitted to the hospitalist service.  Note-has not received the Covid vaccine.  Significant Events: 5/18>> admit to [facility] for severe hypoxemia requiring nonrebreather mask. 5/19>> on heated high flow 5/20>> on heated high flow+ NRB-transfer to MICU  COVID-19 medications: Steroids: 5/18>> Remdesivir: 5/18>>5/22 Actemra: 5/18 x 1  Antibiotics: None  Microbiology data: 5/18: Blood culture>> no growth  Significant studies: 5/20: Bilateral lower extremity Doppler>> negative for DVT. 5/19: Echo>> EF 60-65% 5/19: Chest x-ray>> interstitial pulmonary edema/patchy airspace opacity in the left base 5/18: Chest x-ray>> new diffuse interstitial thickening/reticular opacities suspicious for pulmonary edema  DVT prophylaxis: Coumadin-supratherapeutic INR.  Procedures: None  Consults: PCCM    Subjective:    Patient in bed, appears comfortable, denies any headache, no fever, no chest pain or pressure, improving shortness of breath , no abdominal pain. No focal weakness.    Assessment  & Plan :   Acute on chronic hypoxic Resp Failure due to Covid 19 Viral pneumonia and decompensated diastolic heart failure: She had severe disease and likely presented once her parenchymal damage was quite advanced due to COVID-19 pneumonia and inflammation, received appropriate care with IV steroids, remdesivir and Actemra.  She is currently on IV steroid taper along with IV Lasix as needed , she has finished remdesivir and Actemra treatment.  She is finally feeling better and clinically improving although still on heated high flow around 40 L with 100% FiO2 with intermittent use of nonrebreather mask but there is gradual improvement, will try to titrate her off on nonrebreather mask so that we know exactly how much oxygen she is requiring even if that means going up on heated high flow.  We will continue to monitor and adjust as needed.  She has been adequately diuresed will be using IV Lasix as needed as needed, continue to monitor.  Fever: afebrile  Prone/Incentive Spirometry: She is unable to prone or lie on her side-have encouraged incentive spirometry/flutter use 5 times an hour  O2 requirements:  SpO2: (!) 83 % O2 Flow Rate (L/min): 35 L/min FiO2 (%): 100 %   COVID-19 Labs: Recent Labs    02/23/20 0441  DDIMER 5.13*  CRP 0.6       Component Value Date/Time   BNP 142.3 (H) 02/25/2020 0228    Recent Labs  Lab 02/20/20 0530 02/21/20 0105 02/22/20 0656 02/23/20 0441  PROCALCITON <0.10 <0.10 <0.10 <0.10    Lab Results  Component Value Date   SARSCOV2NAA POSITIVE (A) 02/03/2020   SARSCOV2NAA NEGATIVE 10/06/2019   SARSCOV2NAA NOT DETECTED 04/08/2019   SARSCOV2NAA NOT DETECTED 04/02/2019    Decompensated acute on chronic diastolic heart failure EF 60%: IV Lasix as needed.  Elevated D-dimer:  On chronic Coumadin, INR in good range and D-dimer now trending down, lower extremity venous duplex unremarkable.  COPD: Not in exacerbation this morning-no wheezing-decrease steroids to twice daily dosing.  Continue bronchodilators.  Normally on 4 L of oxygen at all times at home.  AKI on CKD stage IIIb: Baseline creatinine around 1.8, monitor cautiously with diuresis, [place] close to baseline.  PAF with [place] vas 2 score of at least 3: Rate controlled with Cardizem-INR remains supratherapeutic-pharmacy managing Coumadin.  Cardizem dose reduced on 02/25/2020 as blood pressure was slightly soft, will continue to monitor closely.  Remote history of pulmonary embolism: on Coumadin pharmacy following.  Hypothyroidism: Continue Synthroid  Depression/anxiety: Slightly anxious due to acute illness/hypoxia/oxygen requirement-continue as needed Xanax and Prozac.  DM-2 (A1c 5.2 on 10/07/2019): CBGs stable, Lantus dose dropped on 02/25/2020 as steroids are now tapered off.  Will monitor closely.  Recent Labs    02/24/20 1555 02/24/20 2059 02/25/20 0731  GLUCAP 255* 214* 90   Morbid Obesity: Estimated body mass index is 49.68 kg/m as calculated from the following:   Height as of this encounter: 5' 5.5" (1.664 m).   Weight as of this encounter: 137.5 kg.    GI prophylaxis: PPI  Condition - Extremely Guarded-very tenuous with risk for further deterioration  Family Communication  : Prefers that she update her family-she does not want me to call her mother or her son-she fears that her family will panic   Code Status :  Full Code  Diet :  Diet Order            Diet heart healthy/carb modified Room service appropriate? Yes; Fluid consistency: Thin; Fluid restriction: 1200 mL Fluid  Diet effective now               Disposition Plan  :   Status is: Inpatient  Remains inpatient appropriate because:Inpatient level of care appropriate due to severity of illness   Dispo: The patient is  from: Home              Anticipated d/c is to: SNF  Anticipated d/c date is: > 3 days              Patient currently is not medically stable to d/c.  Barriers to discharge: Severe Hypoxia requiring +++ O2 supplementation  Antimicorbials  :    Anti-infectives (From admission, onward)   Start     Dose/Rate Route Frequency Ordered Stop   02/17/20 1000  remdesivir 100 mg in sodium chloride 0.9 % 100 mL IVPB     100 mg 200 mL/hr over 30 Minutes Intravenous Daily Mar 05, 2020 2202 02/20/20 0955   03-05-20 2215  remdesivir 200 mg in sodium chloride 0.9% 250 mL IVPB     200 mg 580 mL/hr over 30 Minutes Intravenous Once Mar 05, 2020 2202 2020-03-05 2319      Inpatient Medications  Scheduled Meds: . vitamin C  500 mg Oral Daily  . Chlorhexidine Gluconate Cloth  6 each Topical Daily  . cholecalciferol  1,000 Units Oral Daily  . diltiazem  240 mg Oral Daily  . FLUoxetine  40 mg Oral Daily  . fluticasone  1 spray Each Nare Daily  . fluticasone furoate-vilanterol  1 puff Inhalation Daily  . insulin aspart  0-5 Units Subcutaneous QHS  . insulin aspart  0-9 Units Subcutaneous TID WC  . insulin detemir  8 Units Subcutaneous Daily  . iron polysaccharides  300 mg Oral Daily  . levothyroxine  175 mcg Oral Q0600  . pantoprazole  40 mg Oral Q1200  . senna-docusate  1 tablet Oral BID  . simvastatin  20 mg Oral QHS  . umeclidinium bromide  1 puff Inhalation Daily  . Warfarin - Pharmacist Dosing Inpatient   Does not apply q1600  . zinc sulfate  220 mg Oral Daily   Continuous Infusions:  PRN Meds:.acetaminophen, albuterol, ALPRAZolam, chlorpheniramine-HYDROcodone, diltiazem, guaiFENesin-dextromethorphan, lip balm, oxymetazoline, sodium chloride, traMADol   Time Spent in minutes  35    See all Orders from today for further details   Susa Raring M.D on 02/25/2020 at 9:13 AM  To page go to [email or web address] - use universal password  [facility] -  Office  [phone]     Objective:   Vitals:   02/25/20 0400 02/25/20 0500 02/25/20 0734 02/25/20 0736  BP:  124/66 105/69   Pulse: 65 72 72   Resp: Temp:    97.8 F (36.6 C)  TempSrc:    Oral  SpO2: 91% (!) 89% (!) 83%   Weight:      Height:        Wt Readings from Last 3 Encounters:  02/23/20 (!) 137.5 kg  01/28/20 132 kg  01/06/20 132 kg     Intake/Output Summary (Last 24 hours) at 02/25/2020 0913 Last data filed at 02/25/2020 0400 Gross per 24 hour  Intake 1080 ml  Output 2950 ml  Net -1870 ml     Physical Exam  Awake Alert, No new F.N deficits, Normal affect [place].AT,PERRAL Supple Neck,No JVD, No cervical lymphadenopathy appriciated.  Symmetrical Chest wall movement, Good air movement bilaterally, CTAB RRR,No Gallops, Rubs or new Murmurs, No Parasternal Heave +ve B.Sounds, Abd Soft, No tenderness, No organomegaly appriciated, No rebound - guarding or rigidity. No Cyanosis, Clubbing or edema, No new Rash or bruise    Data Review:    CBC Recent Labs  Lab 02/20/20 0607 02/20/20 0607 02/21/20 0105 02/22/20 0806 02/23/20 0441 02/24/20 0848 02/25/20 0228  WBC 9.4   < > 12.0* 11.9* 11.6* 11.6* 14.6*  HGB 10.8*   < >  11.4* 11.1* 11.7* 12.2 11.1*  HCT 35.3*   < > 36.8 35.8* 37.0 38.5 34.7*  PLT 319   < > 319 305 292 200 206  MCV 93.6   < > 92.2 93.0 92.5 92.1 92.5  MCH 28.6   < > 28.6 28.8 29.3 29.2 29.6  MCHC 30.6   < > 31.0 31.0 31.6 31.7 32.0  RDW 13.2   < > 13.2 13.3 13.3 13.7 13.6  LYMPHSABS 0.3*  --  0.3*  --  0.6* 0.7 0.5*  MONOABS 0.2  --  0.4  --  0.5 0.6 0.6  EOSABS 0.0  --  0.0  --  0.0 0.1 0.0  BASOSABS 0.0  --  0.0  --  0.0 0.0 0.0   < > = values in this interval not displayed.    Chemistries  Recent Labs  Lab 02/20/20 0607 02/20/20 0607 02/21/20 0105 02/22/20 0656 02/22/20 0806 02/23/20 0441 02/24/20 0848 02/25/20 0228  NA 139   < > 139  --  136 137 137 138  K 4.0   < > 4.3  --  4.3 4.3 4.3 4.8  CL 102   < > 103  --  103 101 103 103  CO2 27    < > 26  --  GLUCOSE 188*   < > 176*  --  143* 104* 78 131*  BUN 70*   < > 73*  --  77* 71* 59* 57*  CREATININE 1.98*   < > 1.90*  --  2.10* 1.82* 1.52* 1.65*  CALCIUM 9.6   < > 9.5  --  9.1 9.2 9.3 9.5  MG  --    < > 2.2 2.2  --  2.4 QUANTITY NOT SUFFICIENT, UNABLE TO PERFORM TEST 2.2  AST 9*  --  12*  --   --  13* 12* 11*  ALT 10  --  10  --   --  ALKPHOS 60  --  61  --   --  68 74 68  BILITOT 1.0  --  0.8  --   --  0.7 QUANTITY NOT SUFFICIENT, UNABLE TO PERFORM TEST 0.4   < > = values in this interval not displayed.   ------------------------------------------------------------------------------------------------------------------ No results for input(s): CHOL, HDL, LDLCALC, TRIG, CHOLHDL, LDLDIRECT in the last 72 hours.  Lab Results  Component Value Date   HGBA1C 5.8 (H) 02/17/2020   ------------------------------------------------------------------------------------------------------------------ No results for input(s): TSH, T4TOTAL, T3FREE, THYROIDAB in the last 72 hours.  Invalid input(s): FREET3 ------------------------------------------------------------------------------------------------------------------ No results for input(s): VITAMINB12, FOLATE, FERRITIN, TIBC, IRON, RETICCTPCT in the last 72 hours.  Coagulation profile Recent Labs  Lab 02/21/20 0105 02/22/20 0656 02/23/20 0441 02/24/20 0848 02/25/20 0228  INR 2.4* 2.3* 2.1* 2.3* 3.1*    Recent Labs    02/23/20 0441  DDIMER 5.13*    Cardiac Enzymes No results for input(s): CKMB, TROPONINI, MYOGLOBIN in the last 168 hours.  Invalid input(s): CK ------------------------------------------------------------------------------------------------------------------    Component Value Date/Time   BNP 142.3 (H) 02/25/2020 0228    Micro Results Recent Results (from the past 240 hour(s))  SARS CORONAVIRUS 2 (TAT 6-24 HRS) Nasopharyngeal Nasopharyngeal Swab     Status: Abnormal    Collection Time: 2020/02/20  5:25 PM   Specimen: Nasopharyngeal Swab  Result Value Ref Range Status   SARS Coronavirus 2 POSITIVE (A) NEGATIVE Final    Comment: RESULT CALLED TO, READ BACK BY AND VERIFIED WITH: RN  S GRINDSTAFF @0028  02/17/20 BY S GEZAHEGN (NOTE) SARS-CoV-2 target nucleic acids are DETECTED. The SARS-CoV-2 RNA is generally detectable in upper and lower respiratory specimens during the acute phase of infection. Positive results are indicative of the presence of SARS-CoV-2 RNA. Clinical correlation with patient history and other diagnostic information is  necessary to determine patient infection status. Positive results do not rule out bacterial infection or co-infection with other viruses.  The expected result is Negative. Fact Sheet for Patients: SugarRoll.be Fact Sheet for Healthcare Providers: https://www.woods-mathews.com/ This test is not yet approved or cleared by the [place] FDA and  has been authorized for detection and/or diagnosis of SARS-CoV-2 by FDA under an Emergency Use Authorization (EUA). This EUA will remain  in effect (meaning this test can be used)  for the duration of the COVID-19 declaration under Section 564(b)(1) of the Act, 21 U.S.C. section 360bbb-3(b)(1), unless the authorization is terminated or revoked sooner. Performed at [facility], [place] [address], [place], [place] [address]   Blood Culture (routine x 2)     Status: None   Collection Time: 01/30/2020  5:28 PM   Specimen: BLOOD  Result Value Ref Range Status   Specimen Description BLOOD RIGHT ANTECUBITAL  Final   Special Requests   Final    BOTTLES DRAWN AEROBIC AND ANAEROBIC Blood Culture adequate volume   Culture   Final    NO GROWTH 5 DAYS Performed at [facility], [place] [address], [place], [place] [address]    Report Status 02/21/2020 FINAL  Final  Blood Culture (routine x 2)     Status: None   Collection Time: 02/20/2020   5:47 PM   Specimen: BLOOD LEFT FOREARM  Result Value Ref Range Status   Specimen Description BLOOD LEFT FOREARM  Final   Special Requests   Final    BOTTLES DRAWN AEROBIC AND ANAEROBIC Blood Culture adequate volume   Culture   Final    NO GROWTH 5 DAYS Performed at [facility], [place] [address] [place], [place] [address]    Report Status 02/21/2020 FINAL  Final  MRSA PCR Screening     Status: None   Collection Time: 02/24/20 10:25 AM   Specimen: Nasal Mucosa; Nasopharyngeal  Result Value Ref Range Status   MRSA by PCR NEGATIVE NEGATIVE Final    Comment:        The GeneXpert MRSA Assay (FDA approved for NASAL specimens only), is one component of a comprehensive MRSA colonization surveillance program. It is not intended to diagnose MRSA infection nor to guide or monitor treatment for MRSA infections. Performed at [facility], [place] [address] [place], [place] [address]     Radiology Reports DG Chest [place] 1 View  Result Date: 02/23/2020 CLINICAL DATA:  COVID pneumonia. EXAM: PORTABLE CHEST 1 VIEW COMPARISON:  One-view chest x-ray 02/20/2020 FINDINGS: Heart is enlarged. Atherosclerotic calcifications are present at the aortic arch. Diffuse interstitial and airspace disease is present. Consolidation at the left base is slightly less dense. A diffuse interstitial pattern has increased some. IMPRESSION: 1. Diffuse interstitial and airspace disease compatible with pneumonia. An element of edema is not excluded. 2. Consolidation at the left base is slightly less dense. Electronically Signed   By: San Morelle M.D.   On: 02/23/2020 08:49   DG Chest Port 1 View  Result Date: 02/20/2020 CLINICAL DATA:  Shortness of breath, history of COVID-19 positivity EXAM: PORTABLE CHEST 1 VIEW COMPARISON:  02/17/2020 FINDINGS: Cardiac shadow  is stable but enlarged. Aortic calcifications are seen. Patchy opacities are noted primarily in the left base consistent with the given clinical  history. Mild vascular congestion is noted centrally. No other focal abnormality is seen. IMPRESSION: Mild vascular congestion. Persistent opacity in the left base consistent with the given clinical history. Electronically Signed   By: Alcide Clever M.D.   On: 02/20/2020 08:29   DG Chest Port 1 View  Result Date: 2020/03/10 CLINICAL DATA:  Dyspnea, fever. EXAM: PORTABLE CHEST 1 VIEW COMPARISON:  Radiograph 10/06/2019 FINDINGS: Significant interval change from prior exam with diffuse interstitial thickening and reticular opacities. There is also perivascular haziness. Cardiomegaly with unchanged mediastinal contours. Aortic atherosclerosis. Possible small pleural effusions. No visualized pneumothorax. IMPRESSION: New diffuse interstitial thickening and reticular opacities suspicious for pulmonary edema. Similar cardiomegaly. Possible small pleural effusions. Findings suspicious for CHF. Electronically Signed   By: Narda Rutherford M.D.   On: 03/10/2020 18:11   DG Chest Port 1V same Day  Result Date: 02/17/2020 CLINICAL DATA:  Shortness of breath EXAM: PORTABLE CHEST 1 VIEW COMPARISON:  2020-03-10 FINDINGS: There is cardiomegaly with pulmonary venous hypertension. There is interstitial edema with patchy airspace opacity in the left base. No adenopathy. There is aortic atherosclerosis. No bone lesions. IMPRESSION: Cardiomegaly with pulmonary vascular congestion. There is interstitial pulmonary edema. Suspect a degree of congestive heart failure. Patchy airspace opacity in the left base is concerning for superimposed pneumonia, although alveolar edema in this area could present in this manner. Both pneumonia and alveolar edema could present concurrently. In comparison with 1 day prior, there appears to be somewhat less interstitial edema. Aortic Atherosclerosis (ICD10-I70.0). Electronically Signed   By: Bretta Bang III M.D.   On: 02/17/2020 08:01   ECHOCARDIOGRAM COMPLETE  Result Date: 02/17/2020     ECHOCARDIOGRAM REPORT   Patient Name:   TINNIE KUNIN Date of Exam: 02/17/2020 Medical Rec #:  [id number]      Height:       65.5 in Accession #:    [id number]     Weight:       290.3 lb Date of Birth:  30-May-1956      BSA:          2.331 m Patient Age:    63 years       BP:           120/67 mmHg Patient Gender: F              HR:           75 bpm. Exam Location:  Inpatient Procedure: 2D Echo Indications:    CHF 428  History:        Patient has prior history of Echocardiogram examinations, most                 recent 01/09/2019. CHF, COPD, Arrythmias:Atrial Fibrillation;                 Risk Factors:Diabetes and Former Smoker. Covid +. cor pulmonale.                 pulmonary embolism.  Sonographer:    Celene Skeen RDCS (AE) Referring Phys: [id number] John Giovanni  Sonographer Comments: Patient is morbidly obese. Image acquisition challenging due to patient body habitus and Image acquisition challenging due to respiratory motion. limited mobility IMPRESSIONS  1. Left ventricular ejection fraction, by estimation, is 60 to 65%. The left ventricle has normal function. The left ventricle has no regional wall  motion abnormalities. Left ventricular diastolic parameters are indeterminate.  2. Right ventricular systolic function was not well visualized. The right ventricular size is not well visualized.  3. Left atrial size was moderately dilated.  4. Right atrial size was mild to moderately dilated.  5. The mitral valve is normal in structure. No evidence of mitral valve regurgitation. No evidence of mitral stenosis.  6. The aortic valve was not well visualized. Aortic valve regurgitation is trivial. Mild to moderate aortic valve sclerosis/calcification is present, without any evidence of aortic stenosis.  7. The inferior vena cava not well seen. FINDINGS  Left Ventricle: Left ventricular ejection fraction, by estimation, is 60 to 65%. The left ventricle has normal function. The left ventricle has no regional wall motion  abnormalities. The left ventricular internal cavity size was normal in size. There is  no left ventricular hypertrophy. Left ventricular diastolic parameters are indeterminate. Right Ventricle: The right ventricular size is not well visualized. Right vetricular wall thickness was not assessed. Right ventricular systolic function was not well visualized. Left Atrium: Left atrial size was moderately dilated. Right Atrium: Right atrial size was mild to moderately dilated. Pericardium: There is no evidence of pericardial effusion. Mitral Valve: The mitral valve is normal in structure. There is mild thickening of the mitral valve leaflet(s). There is mild calcification of the mitral valve leaflet(s). Normal mobility of the mitral valve leaflets. Moderate mitral annular calcification. No evidence of mitral valve regurgitation. No evidence of mitral valve stenosis. Tricuspid Valve: The tricuspid valve is normal in structure. Tricuspid valve regurgitation is mild . No evidence of tricuspid stenosis. Aortic Valve: The aortic valve was not well visualized. Aortic valve regurgitation is trivial. Mild to moderate aortic valve sclerosis/calcification is present, without any evidence of aortic stenosis. Pulmonic Valve: The pulmonic valve was normal in structure. Pulmonic valve regurgitation is not visualized. No evidence of pulmonic stenosis. Aorta: The aortic root is normal in size and structure. Venous: The inferior vena cava not well seen. IAS/Shunts: The interatrial septum was not well visualized.  LEFT VENTRICLE PLAX 2D LVIDd:         5.30 cm LVIDs:         3.40 cm LV PW:         1.10 cm LV IVS:        1.00 cm LVOT diam:     1.80 cm LV SV:         38 LV SV Index:   16 LVOT Area:     2.54 cm  RIGHT VENTRICLE RV S prime:     14.30 cm/s TAPSE (M-mode): 1.9 cm LEFT ATRIUM           Index       RIGHT ATRIUM           Index LA diam:      4.80 cm 2.06 cm/m  RA Area:     17.70 cm LA Vol (A4C): 44.0 ml 18.88 ml/m RA Volume:    45.20 ml  19.39 ml/m  AORTIC VALVE LVOT Vmax:   83.90 cm/s LVOT Vmean:  59.500 cm/s LVOT VTI:    0.150 m  AORTA Ao Root diam: 3.40 cm MITRAL VALVE MV Area (PHT): 3.48 cm    SHUNTS MV Decel Time: 218 msec    Systemic VTI:  0.15 m MV E velocity: 73.70 cm/s  Systemic Diam: 1.80 cm Charlton Haws MD Electronically signed by Charlton Haws MD Signature Date/Time: 02/17/2020/3:39:01 PM    Final    VAS  US LOWER EXTREMITY VENOUS (DVT)  Result Date: 02/18/2020  Lower Venous DVTStudy Indications: Swelling, and Edema.  Comparison Study: no prior Performing Technologist: Blanch MediaMegan Riddle RVS  Examination Guidelines: A complete evaluation includes B-mode imaging, spectral Doppler, color Doppler, and power Doppler as needed of all accessible portions of each vessel. Bilateral testing is considered an integral part of a complete examination. Limited examinations for reoccurring indications may be performed as noted. The reflux portion of the exam is performed with the patient in reverse Trendelenburg.  +---------+---------------+---------+-----------+----------+--------------+ RIGHT    CompressibilityPhasicitySpontaneityPropertiesThrombus Aging +---------+---------------+---------+-----------+----------+--------------+ CFV      Full           Yes      Yes                                 +---------+---------------+---------+-----------+----------+--------------+ SFJ      Full                                                        +---------+---------------+---------+-----------+----------+--------------+ FV Prox  Full                                                        +---------+---------------+---------+-----------+----------+--------------+ FV Mid   Full                                                        +---------+---------------+---------+-----------+----------+--------------+ FV DistalFull                                                         +---------+---------------+---------+-----------+----------+--------------+ PFV      Full                                                        +---------+---------------+---------+-----------+----------+--------------+ POP      Full           Yes      Yes                                 +---------+---------------+---------+-----------+----------+--------------+ PTV      Full                                                        +---------+---------------+---------+-----------+----------+--------------+ PERO     Full                                                        +---------+---------------+---------+-----------+----------+--------------+   +---------+---------------+---------+-----------+----------+--------------+  LEFT     CompressibilityPhasicitySpontaneityPropertiesThrombus Aging +---------+---------------+---------+-----------+----------+--------------+ CFV      Full           Yes      Yes                                 +---------+---------------+---------+-----------+----------+--------------+ SFJ      Full                                                        +---------+---------------+---------+-----------+----------+--------------+ FV Prox  Full                                                        +---------+---------------+---------+-----------+----------+--------------+ FV Mid   Full                                                        +---------+---------------+---------+-----------+----------+--------------+ FV DistalFull                                                        +---------+---------------+---------+-----------+----------+--------------+ PFV      Full                                                        +---------+---------------+---------+-----------+----------+--------------+ POP      Full           Yes      Yes                                  +---------+---------------+---------+-----------+----------+--------------+ PTV      Full                                                        +---------+---------------+---------+-----------+----------+--------------+ PERO     Full                                                        +---------+---------------+---------+-----------+----------+--------------+     Summary: BILATERAL: - No evidence of deep vein thrombosis seen in the lower extremities, bilaterally. -   *See table(s) above for measurements and observations. Electronically signed by Sherald Hess MD on 02/18/2020 at 5:31:51 PM.    Final

## 2020-02-25 NOTE — Progress Notes (Signed)
ANTICOAGULATION CONSULT NOTE  Pharmacy Consult for Coumadin Indication: history of atrial fibrillation and PE  Allergies  Allergen Reactions  . Pseudoephedrine Other (See Comments)    REACTION: tightness in chest  . Augmentin [Amoxicillin-Pot Clavulanate] Itching and Rash    Did it involve swelling of the face/tongue/throat, SOB, or low BP? No Did it involve sudden or severe rash/hives, skin peeling, or any reaction on the inside of your mouth or nose? Yes Did you need to seek medical attention at a hospital or doctor's office? Yes When did it last happen?2019 If all above answers are "NO", may proceed with cephalosporin use.   . Latex Itching and Rash    Patient Measurements: Height: 5' 5.5" (166.4 cm) Weight: (!) 137.5 kg (303 lb 2.1 oz) IBW/kg (Calculated) : 58.15  Vital Signs: Temp: 98 F (36.7 C) (05/27 1112) Temp Source: Oral (05/27 1112) BP: 105/69 (05/27 0734) Pulse Rate: 72 (05/27 0734)  Labs: Recent Labs    02/23/20 0441 02/23/20 0441 02/24/20 0848 02/25/20 0228  HGB 11.7*   < > 12.2 11.1*  HCT 37.0  --  38.5 34.7*  PLT 292  --  200 206  LABPROT 22.6*  --  24.4* 30.9*  INR 2.1*  --  2.3* 3.1*  CREATININE 1.82*  --  1.52* 1.65*   < > = values in this interval not displayed.    Estimated Creatinine Clearance: 48.9 mL/min (A) (by C-G formula based on SCr of 1.65 mg/dL (H)).   Assessment: 54 YOF admitted with Covid, to continue Coumadin for history of Afib and PE.  Patient had [facility] clinic visit on 02/01/2020 with INR at 4.4.  INR just slightly SUPRA-therapeutic at 3.1 today after receiving 5mg  on 5/25 and 2.5mg  yesterday.  CBC is stable.  No bleeding reported. Intake 60-75% recorded - appetite fair per RNs - which may be contributing to INR fluctuations.   Home Coumadin dose: 2.5mg  daily except 5mg  on Mon  Goal of Therapy:  INR 2-3   Plan:   Coumadin 1mg  PO today - Low dose due to significant rise in INR in last 24 hours and to prevent dropping to  low Daily PT / INR  , PharmD, BCPS, BCCCP Clinical Pharmacist Please refer to [facility] for [facility] Pharmacy numbers 02/25/2020, 11:19 AM

## 2020-02-26 ENCOUNTER — Inpatient Hospital Stay (HOSPITAL_COMMUNITY): Payer: Medicare HMO

## 2020-02-26 LAB — CBC WITH DIFFERENTIAL/PLATELET
Abs Immature Granulocytes: 0.13 10*3/uL — ABNORMAL HIGH (ref 0.00–0.07)
Basophils Absolute: 0 10*3/uL (ref 0.0–0.1)
Basophils Relative: 0 %
Eosinophils Absolute: 0 10*3/uL (ref 0.0–0.5)
Eosinophils Relative: 0 %
HCT: 33.8 % — ABNORMAL LOW (ref 36.0–46.0)
Hemoglobin: 10.7 g/dL — ABNORMAL LOW (ref 12.0–15.0)
Immature Granulocytes: 1 %
Lymphocytes Relative: 4 %
Lymphs Abs: 0.5 10*3/uL — ABNORMAL LOW (ref 0.7–4.0)
MCH: 29.6 pg (ref 26.0–34.0)
MCHC: 31.7 g/dL (ref 30.0–36.0)
MCV: 93.4 fL (ref 80.0–100.0)
Monocytes Absolute: 0.6 10*3/uL (ref 0.1–1.0)
Monocytes Relative: 5 %
Neutro Abs: 11.2 10*3/uL — ABNORMAL HIGH (ref 1.7–7.7)
Neutrophils Relative %: 90 %
Platelets: 199 10*3/uL (ref 150–400)
RBC: 3.62 MIL/uL — ABNORMAL LOW (ref 3.87–5.11)
RDW: 13.7 % (ref 11.5–15.5)
WBC: 12.4 10*3/uL — ABNORMAL HIGH (ref 4.0–10.5)
nRBC: 0 % (ref 0.0–0.2)

## 2020-02-26 LAB — COMPREHENSIVE METABOLIC PANEL
ALT: 12 U/L (ref 0–44)
AST: 12 U/L — ABNORMAL LOW (ref 15–41)
Albumin: 2.7 g/dL — ABNORMAL LOW (ref 3.5–5.0)
Alkaline Phosphatase: 82 U/L (ref 38–126)
Anion gap: 9 (ref 5–15)
BUN: 53 mg/dL — ABNORMAL HIGH (ref 8–23)
CO2: 27 mmol/L (ref 22–32)
Calcium: 9.7 mg/dL (ref 8.9–10.3)
Chloride: 99 mmol/L (ref 98–111)
Creatinine, Ser: 1.7 mg/dL — ABNORMAL HIGH (ref 0.44–1.00)
GFR calc Af Amer: 36 mL/min — ABNORMAL LOW (ref >60)
GFR calc non Af Amer: 31 mL/min — ABNORMAL LOW (ref >60)
Glucose, Bld: 190 mg/dL — ABNORMAL HIGH (ref 70–99)
Potassium: 4.4 mmol/L (ref 3.5–5.1)
Sodium: 135 mmol/L (ref 135–145)
Total Bilirubin: 0.4 mg/dL (ref 0.3–1.2)
Total Protein: 5.5 g/dL — ABNORMAL LOW (ref 6.5–8.1)

## 2020-02-26 LAB — GLUCOSE, CAPILLARY
Glucose-Capillary: 123 mg/dL — ABNORMAL HIGH (ref 70–99)
Glucose-Capillary: 88 mg/dL (ref 70–99)
Glucose-Capillary: 115 mg/dL — ABNORMAL HIGH (ref 70–99)
Glucose-Capillary: 163 mg/dL — ABNORMAL HIGH (ref 70–99)

## 2020-02-26 LAB — MAGNESIUM: Magnesium: 2.3 mg/dL (ref 1.7–2.4)

## 2020-02-26 LAB — PROTIME-INR
INR: 3.1 — ABNORMAL HIGH (ref 0.8–1.2)
Prothrombin Time: 30.8 seconds — ABNORMAL HIGH (ref 11.4–15.2)

## 2020-02-26 LAB — BRAIN NATRIURETIC PEPTIDE: B Natriuretic Peptide: 160.2 pg/mL — ABNORMAL HIGH (ref 0.0–100.0)

## 2020-02-26 MED ORDER — FUROSEMIDE 10 MG/ML IJ SOLN
40.0000 mg | Freq: Once | INTRAMUSCULAR | Status: AC
  Administered 2020-02-26: 40 mg via INTRAVENOUS
  Filled 2020-02-26: qty 4

## 2020-02-26 MED ORDER — SIMETHICONE 80 MG PO CHEW
80.0000 mg | CHEWABLE_TABLET | Freq: Four times a day (QID) | ORAL | Status: DC | PRN
  Administered 2020-02-26 – 2020-02-27 (×2): 80 mg via ORAL
  Filled 2020-02-26 (×2): qty 1

## 2020-02-26 MED ORDER — WARFARIN SODIUM 1 MG PO TABS
1.0000 mg | ORAL_TABLET | Freq: Once | ORAL | Status: AC
  Administered 2020-02-26: 1 mg via ORAL
  Filled 2020-02-26: qty 1

## 2020-02-26 NOTE — Progress Notes (Signed)
NAME:  Amy Bryant, MRN:  [id number], DOB:  Oct 15, 1955, LOS: 10 ADMISSION DATE:  02-18-2020, CONSULTATION DATE: 02/18/2020 REFERRING MD:  Jeoffrey Massed, CHIEF COMPLAINT: COVID-19 pneumonia  Brief History   64 year old with past medical history including COPD, CKD, chronic respiratory failure presenting with COVID-19 pneumonia on 5/18. Treated with steroids, remdesivir and Actemra. PCCM consulted for worsening hypoxia  Past Medical History  COPD with chronic hypoxic respiratory failure requiring 4 L [place] supplemental oxygen at all times Chronic diastolic congestive heart failure PAF on Coumadin Prior history of PE CKD stage IIIb Noninsulin-dependent type 2 diabetes Morbid obesity Reactive airway dysfunction  Osteoarthritis  Hypothyroidism GERD  Significant Hospital Events   5/19 Admit 5/20 Transfer to ICU 5/24 Remains on HHF and NRB  Consults:  PCCM  Procedures:    Significant Diagnostic Tests:  Chest x-ray 5/20-cardiomegaly, patchy airspace disease, vascular congestion  Echo 5/19-LVEF 60 to 65%, moderate LA, RA dilation  Micro Data:  5/18 sars2: positive 5/18 blood: negative Antimicrobials/COVID Rx  Solumedrol5/18>> Remdesivir:5/18>> 5/23 Actemra: 5/18 x 1  Interim history/subjective:   Remains on high flow, nonrebreather.  No change in status  Objective   Blood pressure (!) 119/96, pulse 72, temperature 97.8 F (36.6 C), temperature source Oral, resp. rate (!) 22, height 5' 5.5" (1.664 m), weight (!) 136.7 kg, SpO2 (!) 80 %.    FiO2 (%):  [100 %] 100 %   Intake/Output Summary (Last 24 hours) at 02/26/2020 0848 Last data filed at 02/26/2020 0800 Gross per 24 hour  Intake 2040 ml  Output 2650 ml  Net -610 ml   Filed Weights   02/22/20 0500 02/23/20 0500 02/26/20 0500  Weight: (!) 137.4 kg (!) 137.5 kg (!) 136.7 kg    Examination: Blood pressure (!) 119/96, pulse 72, temperature 97.8 F (36.6 C), temperature source Oral, resp. rate (!) 22, height  5' 5.5" (1.664 m), weight (!) 136.7 kg, SpO2 (!) 80 %. Gen:      No acute distress, obese HEENT:  EOMI, sclera anicteric Neck:     No masses; no thyromegaly Lungs:    Clear to auscultation bilaterally; normal respiratory effort CV:         Regular rate and rhythm; no murmurs Abd:      + bowel sounds; soft, non-tender; no palpable masses, no distension Ext:    No edema; adequate peripheral perfusion Skin:      Warm and dry; no rash Neuro: alert and oriented x 3 Psych: normal mood and affect   Resolved Hospital Problem list     Assessment & Plan:  Severe ARDS secondary to COVID-19 pneumonia Baseline COPD on 4 L oxygen. Continue HHFNC and oxygen support sat >85% -intermittent use of NRB for short periods for recovery -Encourage self prone -Incentive spirometer, mobilize, pulmonary hygiene -Continue monitoring in ICU  CHF Paroxysmal atrial fibrillation on Coumadin anticoagulation as outpatient -Diuresis as tolerated Continue Cardizem Following INR, Coumadin dosing per pharmacy   Chronic kidney disease, AKI Follow urine output, great with diuresis -renal indices, up ever so slightly today and bicarb up to 29 as well.  -Avoid nephrotoxins, renal dose medications  Diabetes -Sliding-scale insulin, -Levemir 10 units twice daily -bs extremely variable  Hypothyroidism- last TSH 5, 04/2019 Continue Synthroid  Constipation:  -adding prn stool softener  PCCM will be available over the weekend.  Please call with any questions.  Best practice:  Diet: PO diet Pain/Anxiety/Delirium protocol (if indicated): NA VAP protocol (if indicated): NA DVT prophylaxis: Coumadin GI prophylaxis: PPI  Glucose control: see above Mobility: Bed Code Status: Full Family Communication: Per primary Disposition: ICU  Critical Critical care time:   Marshell Garfinkel MD [facility] Please see [email or web address] for pager details.  02/26/2020, 8:51 AM

## 2020-02-26 NOTE — Progress Notes (Signed)
Physical Therapy Treatment Patient Details Name: Amy Bryant MRN: [id number] DOB: 1956/06/15 Today's Date: 02/26/2020    History of Present Illness 64 y.o. female admitted on 02-21-20 for SOB.  Dx with COVID 19 PNA and decompensated diastolic heart failure. Pt with significant PMH of DM2, depression/anxiety, PE, PAF, CKD III, COPD (on 4 L O2 [place] at baseline), morbid obestiy, CHF, cellulitis.     PT Comments    Pt did not want to get up to get back to bed,  Concentrated on upper and LE exercise with graded to significant resistance and coaching for breathing.   Follow Up Recommendations  Home health PT     Equipment Recommendations  Other (comment)    Recommendations for Other Services       Precautions / Restrictions Precautions Precautions: Fall Precaution Comments: desats with minimal exertion, supplements [place] with NRB Restrictions Weight Bearing Restrictions: No    Mobility  Bed Mobility                  Transfers                    Ambulation/Gait                 Stairs             Wheelchair Mobility    Modified Rankin (Stroke Patients Only)       Balance     Sitting balance-Leahy Scale: Good                                      Cognition Arousal/Alertness: Awake/alert Behavior During Therapy: WFL for tasks assessed/performed Overall Cognitive Status: Within Functional Limits for tasks assessed                                        Exercises General Exercises - Upper Extremity Shoulder Flexion: Both;10 reps;Seated;AROM Shoulder Horizontal ABduction: Strengthening;Both;10 reps;Seated;Theraband Elbow Flexion: Strengthening;Both;Supine;15 reps(resisted) Elbow Extension: Strengthening;Both;Supine;15 reps(resisted) General Exercises - Lower Extremity Hip Flexion/Marching: AROM;Both;10 reps;Other (comment);Seated(heavy resistance of gross extention)    General Comments General comments  (skin integrity, edema, etc.): On 35L HHF, at rest 84% at 70's and 80's bpm,  with exercise  on LE's sats dropped to 81% at 8's bpm  on UE's sats rose to mid 90's and 70-'s and 80's bpm      Pertinent Vitals/Pain Pain Assessment: No/denies pain    Home Living                      Prior Function            PT Goals (current goals can now be found in the care plan section) Acute Rehab PT Goals PT Goal Formulation: With patient Time For Goal Achievement: 03/03/20 Potential to Achieve Goals: Fair Progress towards PT goals: Progressing toward goals    Frequency           PT Plan Current plan remains appropriate    Co-evaluation              AM-PAC PT "6 Clicks" Mobility   Outcome Measure  Help needed turning from your back to your side while in a flat bed without using bedrails?: A Little Help needed moving from lying on your back to sitting on  the side of a flat bed without using bedrails?: A Little Help needed moving to and from a bed to a chair (including a wheelchair)?: A Little Help needed standing up from a chair using your arms (e.g., wheelchair or bedside chair)?: A Little Help needed to walk in hospital room?: A Little Help needed climbing 3-5 steps with a railing? : A Lot 6 Click Score: 17    End of Session Equipment Utilized During Treatment: Oxygen       PT Visit Diagnosis: Muscle weakness (generalized) (M62.81);Difficulty in walking, not elsewhere classified (R26.2)     Time: 6861-0424 PT Time Calculation (min) (ACUTE ONLY): 18 min  Charges:  $Therapeutic Exercise: 8-22 mins                     02/26/2020  Jacinto Halim., PT Acute Rehabilitation Services [phone]  (pager) [phone]  (office)   Eliseo Gum Marlowe Cinquemani 02/26/2020, 4:57 PM

## 2020-02-26 NOTE — Progress Notes (Signed)
PROGRESS NOTE                                                                                                                                                                                                             Patient Demographics:    Janaia Bryant, is a 64 y.o. female, DOB - 05/23/1956, NAT:[id number]  Outpatient Primary MD for the patient is [name], [name], MD   Admit date - 01/30/2020   LOS - 10  CC - SOB     Brief Narrative:  Patient is a 64 y.o. female with PMHx of COPD with chronic hypoxic respiratory failure on 4 L of oxygen at home, chronic diastolic heart failure, PAF on Coumadin, history of PE, CKD stage IIIb, non-insulin-dependent DM-2, morbid obesity-who presented to the hospital with approximately 2-3-day history of worsening shortness of breath.  For approximately 1 week-she has been having nasal congestion/URI symptoms-her PCP called in prednisone and Zithromax which she took without any relief.  In the emergency room-she was noted to have severe hypoxemia-requiring NRB-she was subsequently admitted to the hospitalist service.  Note-has not received the Covid vaccine.  Significant Events: 5/18>> admit to [facility] for severe hypoxemia requiring nonrebreather mask. 5/19>> on heated high flow 5/20>> on heated high flow+ NRB-transfer to MICU  COVID-19 medications: Steroids: 5/18>> Remdesivir: 5/18>>5/22 Actemra: 5/18 x 1  Antibiotics: None  Microbiology data: 5/18: Blood culture>> no growth  Significant studies: 5/20: Bilateral lower extremity Doppler>> negative for DVT. 5/19: Echo>> EF 60-65% 5/19: Chest x-ray>> interstitial pulmonary edema/patchy airspace opacity in the left base 5/18: Chest x-ray>> new diffuse interstitial thickening/reticular opacities suspicious for pulmonary edema  DVT prophylaxis: Coumadin-supratherapeutic INR.  Procedures: None  Consults: PCCM    Subjective:    Patient in bed, appears comfortable, denies any headache, no fever, no chest pain or pressure, +ve shortness of breath , no abdominal pain. No focal weakness.   Assessment  & Plan :   Acute on chronic hypoxic Resp Failure due to Covid 19 Viral pneumonia and decompensated diastolic heart failure: She had severe disease and likely presented once her parenchymal damage was quite advanced due to COVID-19 pneumonia and inflammation, received appropriate care with IV steroids, remdesivir and Actemra.  She has finished her steroid course along with IV Actemra and remdesivir. She is finally feeling better and clinically gradually improving although  still on heated high flow around 35 L - 40 L with 100% FiO2 with intermittent use of nonrebreather mask but there is gradual improvement.  Encouraged her to sit up in the chair in the daytime and use I-S and flutter valve for pulmonary toiletry.  She has incurred severe parenchymal lung injury from COVID-19 pneumonia and will take several weeks before she can come back to her baseline, continue heated high flow oxygen and monitoring in ICU, diurese with as needed Lasix as needed.   O2 requirements:  SpO2: (!) 80 % O2 Flow Rate (L/min): 35 L/min FiO2 (%): 100 %   COVID-19 Labs: No results for input(s): DDIMER, FERRITIN, LDH, CRP in the last 72 hours.     Component Value Date/Time   BNP 160.2 (H) 02/26/2020 0337    Recent Labs  Lab 02/20/20 0530 02/21/20 0105 02/22/20 0656 02/23/20 0441  PROCALCITON <0.10 <0.10 <0.10 <0.10    Lab Results  Component Value Date   SARSCOV2NAA POSITIVE (A) March 07, 2020   SARSCOV2NAA NEGATIVE 10/06/2019   SARSCOV2NAA NOT DETECTED 04/08/2019   SARSCOV2NAA NOT DETECTED 04/02/2019    Decompensated acute on chronic diastolic heart failure EF 60%: IV Lasix as needed.  Will give a dose on 02/26/2020.  Elevated D-dimer: On chronic Coumadin, INR in good range and D-dimer now trending down, lower extremity venous duplex  unremarkable.  COPD: Not in exacerbation this morning-no wheezing-decrease steroids to twice daily dosing.  Continue bronchodilators.  Normally on 4 L of oxygen at all times at home.  AKI on CKD stage IIIb: Baseline creatinine around 1.8, monitor cautiously with diuresis, [place] close to baseline.  PAF with [place] vas 2 score of at least 3: Rate controlled with Cardizem-INR remains supratherapeutic-pharmacy managing Coumadin.  Cardizem dose reduced on 02/25/2020 as blood pressure was slightly soft, will continue to monitor closely.  Remote history of pulmonary embolism: on Coumadin pharmacy following.  Hypothyroidism: Continue Synthroid  Depression/anxiety: Slightly anxious due to acute illness/hypoxia/oxygen requirement-continue as needed Xanax and Prozac.  DM-2 (A1c 5.2 on 10/07/2019): CBGs stable, Lantus dose dropped on 02/25/2020 as steroids are now tapered off.  Will monitor closely.  Recent Labs    02/25/20 1738 02/25/20 2114 02/26/20 0751  GLUCAP 218* 275* 123*   Morbid Obesity: Estimated body mass index is 49.39 kg/m as calculated from the following:   Height as of this encounter: 5' 5.5" (1.664 m).   Weight as of this encounter: 136.7 kg.    GI prophylaxis: PPI  Condition - Extremely Guarded-very tenuous with risk for further deterioration  Family Communication  : Prefers that she update her family-she does not want me to call her mother or her son-she fears that her family will panic   Code Status :  Full Code  Diet :  Diet Order            Diet heart healthy/carb modified Room service appropriate? Yes; Fluid consistency: Thin; Fluid restriction: 1200 mL Fluid  Diet effective now               Disposition Plan  :   Status is: Inpatient  Remains inpatient appropriate because:Inpatient level of care appropriate due to severity of illness   Dispo: The patient is from: Home              Anticipated d/c is to: SNF              Anticipated d/c date is: > 3  days  Patient currently is not medically stable to d/c.  Barriers to discharge: Severe Hypoxia requiring +++ O2 supplementation  Antimicorbials  :    Anti-infectives (From admission, onward)   Start     Dose/Rate Route Frequency Ordered Stop   02/17/20 1000  remdesivir 100 mg in sodium chloride 0.9 % 100 mL IVPB     100 mg 200 mL/hr over 30 Minutes Intravenous Daily 2020/03/05 2202 02/20/20 0955   2020-03-05 2215  remdesivir 200 mg in sodium chloride 0.9% 250 mL IVPB     200 mg 580 mL/hr over 30 Minutes Intravenous Once 2020-03-05 2202 Mar 05, 2020 2319      Inpatient Medications  Scheduled Meds: . vitamin C  500 mg Oral Daily  . Chlorhexidine Gluconate Cloth  6 each Topical Daily  . cholecalciferol  1,000 Units Oral Daily  . diltiazem  180 mg Oral Daily  . FLUoxetine  40 mg Oral Daily  . fluticasone  1 spray Each Nare Daily  . fluticasone furoate-vilanterol  1 puff Inhalation Daily  . insulin aspart  0-5 Units Subcutaneous QHS  . insulin aspart  0-9 Units Subcutaneous TID WC  . insulin detemir  8 Units Subcutaneous Daily  . iron polysaccharides  300 mg Oral Daily  . levothyroxine  175 mcg Oral Q0600  . pantoprazole  40 mg Oral Q1200  . senna-docusate  1 tablet Oral BID  . simvastatin  20 mg Oral QHS  . umeclidinium bromide  1 puff Inhalation Daily  . warfarin  1 mg Oral ONCE-1600  . Warfarin - Pharmacist Dosing Inpatient   Does not apply q1600  . zinc sulfate  220 mg Oral Daily   Continuous Infusions:  PRN Meds:.acetaminophen, albuterol, ALPRAZolam, chlorpheniramine-HYDROcodone, diltiazem, docusate sodium, guaiFENesin-dextromethorphan, lip balm, oxymetazoline, sodium chloride, traMADol   Time Spent in minutes  35    See all Orders from today for further details   Susa Raring M.D on 02/26/2020 at 9:12 AM  To page go to [email or web address] - use universal password  [facility] -  Office  [phone]    Objective:   Vitals:   02/26/20 0754 02/26/20  0800 02/26/20 0815 02/26/20 0830  BP:      Pulse: 71 72 72 72  Resp: 17 19 (!) 21 (!) 22  Temp: 97.8 F (36.6 C)     TempSrc: Oral     SpO2: (!) 85% (!) 89% (!) 80% (!) 80%  Weight:      Height:        Wt Readings from Last 3 Encounters:  02/26/20 (!) 136.7 kg  01/28/20 132 kg  01/06/20 132 kg     Intake/Output Summary (Last 24 hours) at 02/26/2020 0912 Last data filed at 02/26/2020 0800 Gross per 24 hour  Intake 2040 ml  Output 2650 ml  Net -610 ml     Physical Exam  Awake Alert, No new F.N deficits, Normal affect [place].AT,PERRAL Supple Neck,No JVD, No cervical lymphadenopathy appriciated.  Symmetrical Chest wall movement, Good air movement bilaterally, few rales RRR,No Gallops, Rubs or new Murmurs, No Parasternal Heave +ve B.Sounds, Abd Soft, No tenderness, No organomegaly appriciated, No rebound - guarding or rigidity. No Cyanosis, Clubbing or edema, No new Rash or bruise    Data Review:    CBC Recent Labs  Lab 02/21/20 0105 02/21/20 0105 02/22/20 0806 02/23/20 0441 02/24/20 0848 02/25/20 0228 02/26/20 0337  WBC 12.0*   < > 11.9* 11.6* 11.6* 14.6* 12.4*  HGB 11.4*   < > 11.1* 11.7* 12.2 11.1*  10.7*  HCT 36.8   < > 35.8* 37.0 38.5 34.7* 33.8*  PLT 319   < > 305 292 200 206 199  MCV 92.2   < > 93.0 92.5 92.1 92.5 93.4  MCH 28.6   < > 28.8 29.3 29.2 29.6 29.6  MCHC 31.0   < > 31.0 31.6 31.7 32.0 31.7  RDW 13.2   < > 13.3 13.3 13.7 13.6 13.7  LYMPHSABS 0.3*  --   --  0.6* 0.7 0.5* 0.5*  MONOABS 0.4  --   --  0.5 0.6 0.6 0.6  EOSABS 0.0  --   --  0.0 0.1 0.0 0.0  BASOSABS 0.0  --   --  0.0 0.0 0.0 0.0   < > = values in this interval not displayed.    Chemistries  Recent Labs  Lab 02/21/20 0105 02/21/20 0105 02/22/20 0656 02/22/20 0806 02/23/20 0441 02/24/20 0848 02/25/20 0228 02/26/20 0337  NA 139   < >  --  136 137 137 138 135  K 4.3   < >  --  4.3 4.3 4.3 4.8 4.4  CL 103   < >  --  103 101 103 103 99  CO2 26   < >  --  GLUCOSE 176*   < >  --  143* 104* 78 131* 190*  BUN 73*   < >  --  77* 71* 59* 57* 53*  CREATININE 1.90*   < >  --  2.10* 1.82* 1.52* 1.65* 1.70*  CALCIUM 9.5   < >  --  9.1 9.2 9.3 9.5 9.7  MG 2.2   < > 2.2  --  2.4 QUANTITY NOT SUFFICIENT, UNABLE TO PERFORM TEST 2.2 2.3  AST 12*  --   --   --  13* 12* 11* 12*  ALT 10  --   --   --  ALKPHOS 61  --   --   --  68 74 68 82  BILITOT 0.8  --   --   --  0.7 QUANTITY NOT SUFFICIENT, UNABLE TO PERFORM TEST 0.4 0.4   < > = values in this interval not displayed.   ------------------------------------------------------------------------------------------------------------------ No results for input(s): CHOL, HDL, LDLCALC, TRIG, CHOLHDL, LDLDIRECT in the last 72 hours.  Lab Results  Component Value Date   HGBA1C 5.8 (H) 02/17/2020   ------------------------------------------------------------------------------------------------------------------ No results for input(s): TSH, T4TOTAL, T3FREE, THYROIDAB in the last 72 hours.  Invalid input(s): FREET3 ------------------------------------------------------------------------------------------------------------------ No results for input(s): VITAMINB12, FOLATE, FERRITIN, TIBC, IRON, RETICCTPCT in the last 72 hours.  Coagulation profile Recent Labs  Lab 02/22/20 0656 02/23/20 0441 02/24/20 0848 02/25/20 0228 02/26/20 0337  INR 2.3* 2.1* 2.3* 3.1* 3.1*    No results for input(s): DDIMER in the last 72 hours.  Cardiac Enzymes No results for input(s): CKMB, TROPONINI, MYOGLOBIN in the last 168 hours.  Invalid input(s): CK ------------------------------------------------------------------------------------------------------------------    Component Value Date/Time   BNP 160.2 (H) 02/26/2020 9629    Micro Results Recent Results (from the past 240 hour(s))  SARS CORONAVIRUS 2 (TAT 6-24 HRS) Nasopharyngeal Nasopharyngeal Swab     Status: Abnormal   Collection Time: Mar 04, 2020   5:25 PM   Specimen: Nasopharyngeal Swab  Result Value Ref Range Status   SARS Coronavirus 2 POSITIVE (A) NEGATIVE Final    Comment: RESULT CALLED TO, READ BACK BY AND VERIFIED WITH: RN S GRINDSTAFF  02/17/20 BY S GEZAHEGN (NOTE)  SARS-CoV-2 target nucleic acids are DETECTED. The SARS-CoV-2 RNA is generally detectable in upper and lower respiratory specimens during the acute phase of infection. Positive results are indicative of the presence of SARS-CoV-2 RNA. Clinical correlation with patient history and other diagnostic information is  necessary to determine patient infection status. Positive results do not rule out bacterial infection or co-infection with other viruses.  The expected result is Negative. Fact Sheet for Patients: HairSlick.no Fact Sheet for Healthcare Providers: quierodirigir.com This test is not yet approved or cleared by the [place] FDA and  has been authorized for detection and/or diagnosis of SARS-CoV-2 by FDA under an Emergency Use Authorization (EUA). This EUA will remain  in effect (meaning this test can be used)  for the duration of the COVID-19 declaration under Section 564(b)(1) of the Act, 21 U.S.C. section 360bbb-3(b)(1), unless the authorization is terminated or revoked sooner. Performed at [facility], [address] [place], [place] [address]   Blood Culture (routine x 2)     Status: None   Collection Time: 02/20/2020  5:28 PM   Specimen: BLOOD  Result Value Ref Range Status   Specimen Description BLOOD RIGHT ANTECUBITAL  Final   Special Requests   Final    BOTTLES DRAWN AEROBIC AND ANAEROBIC Blood Culture adequate volume   Culture   Final    NO GROWTH 5 DAYS Performed at [facility], [address], [place], [place] [address]    Report Status 02/21/2020 FINAL  Final  Blood Culture (routine x 2)     Status: None   Collection Time: 02/05/2020  5:47 PM   Specimen: BLOOD  LEFT FOREARM  Result Value Ref Range Status   Specimen Description BLOOD LEFT FOREARM  Final   Special Requests   Final    BOTTLES DRAWN AEROBIC AND ANAEROBIC Blood Culture adequate volume   Culture   Final    NO GROWTH 5 DAYS Performed at [facility], [address], [place], [place] [address]    Report Status 02/21/2020 FINAL  Final  MRSA PCR Screening     Status: None   Collection Time: 02/24/20 10:25 AM   Specimen: Nasal Mucosa; Nasopharyngeal  Result Value Ref Range Status   MRSA by PCR NEGATIVE NEGATIVE Final    Comment:        The GeneXpert MRSA Assay (FDA approved for NASAL specimens only), is one component of a comprehensive MRSA colonization surveillance program. It is not intended to diagnose MRSA infection nor to guide or monitor treatment for MRSA infections. Performed at [facility], [address], [place], [place] [address]     Radiology Reports DG Chest [place] 1 View  Result Date: 02/26/2020 CLINICAL DATA:  64 year old female with respiratory failure. Chronic lung disease. EXAM: PORTABLE CHEST 1 VIEW COMPARISON:  Portable chest 02/23/2020 and earlier. FINDINGS: Portable AP upright view at 0806 hours. Stable cardiomegaly and mediastinal contours. Visualized tracheal air column is within normal limits. Coarse and confluent bilateral pulmonary opacity, basilar predominant. No superimposed pneumothorax or pleural effusion. Although bilateral lung base ventilation appears mildly worsened since 02/20/2020. No acute osseous abnormality identified. IMPRESSION: Chronic lung disease and cardiomegaly with basilar predominant abnormal pulmonary opacity most suggestive of pneumonia. Mild progression since 02/20/2020. No pleural effusion identified. Electronically Signed   By: Odessa Fleming M.D.   On: 02/26/2020 08:36   DG Chest Port 1 View  Result Date: 02/23/2020 CLINICAL DATA:  COVID pneumonia. EXAM: PORTABLE CHEST 1 VIEW  COMPARISON:  One-view chest x-ray 02/20/2020  FINDINGS: Heart is enlarged. Atherosclerotic calcifications are present at the aortic arch. Diffuse interstitial and airspace disease is present. Consolidation at the left base is slightly less dense. A diffuse interstitial pattern has increased some. IMPRESSION: 1. Diffuse interstitial and airspace disease compatible with pneumonia. An element of edema is not excluded. 2. Consolidation at the left base is slightly less dense. Electronically Signed   By: Marin Robertshristopher  Mattern M.D.   On: 02/23/2020 08:49   DG Chest Port 1 View  Result Date: 02/20/2020 CLINICAL DATA:  Shortness of breath, history of COVID-19 positivity EXAM: PORTABLE CHEST 1 VIEW COMPARISON:  02/17/2020 FINDINGS: Cardiac shadow is stable but enlarged. Aortic calcifications are seen. Patchy opacities are noted primarily in the left base consistent with the given clinical history. Mild vascular congestion is noted centrally. No other focal abnormality is seen. IMPRESSION: Mild vascular congestion. Persistent opacity in the left base consistent with the given clinical history. Electronically Signed   By: Alcide CleverMark  Lukens M.D.   On: 02/20/2020 08:29   DG Chest Port 1 View  Result Date: September 30, 2020 CLINICAL DATA:  Dyspnea, fever. EXAM: PORTABLE CHEST 1 VIEW COMPARISON:  Radiograph 10/06/2019 FINDINGS: Significant interval change from prior exam with diffuse interstitial thickening and reticular opacities. There is also perivascular haziness. Cardiomegaly with unchanged mediastinal contours. Aortic atherosclerosis. Possible small pleural effusions. No visualized pneumothorax. IMPRESSION: New diffuse interstitial thickening and reticular opacities suspicious for pulmonary edema. Similar cardiomegaly. Possible small pleural effusions. Findings suspicious for CHF. Electronically Signed   By: Narda RutherfordMelanie  Sanford M.D.   On: 0December 31, 2021 18:11   DG Chest Port 1V same Day  Result Date: 02/17/2020 CLINICAL DATA:  Shortness of breath EXAM: PORTABLE CHEST 1 VIEW  COMPARISON:  Feb 16, 2020 FINDINGS: There is cardiomegaly with pulmonary venous hypertension. There is interstitial edema with patchy airspace opacity in the left base. No adenopathy. There is aortic atherosclerosis. No bone lesions. IMPRESSION: Cardiomegaly with pulmonary vascular congestion. There is interstitial pulmonary edema. Suspect a degree of congestive heart failure. Patchy airspace opacity in the left base is concerning for superimposed pneumonia, although alveolar edema in this area could present in this manner. Both pneumonia and alveolar edema could present concurrently. In comparison with 1 day prior, there appears to be somewhat less interstitial edema. Aortic Atherosclerosis (ICD10-I70.0). Electronically Signed   By: Bretta BangWilliam  Woodruff III M.D.   On: 02/17/2020 08:01   ECHOCARDIOGRAM COMPLETE  Result Date: 02/17/2020    ECHOCARDIOGRAM REPORT   [facility] Date of Exam: 02/17/2020 Medical Rec #:  [id number]      Height:       65.5 in Accession #:    [id number]     Weight:       290.3 lb Date of Birth:  Aug 09, 1956      BSA:          2.331 m Patient Age:    63 years       BP:           120/67 mmHg Patient Gender: F              HR:           75 bpm. Exam Location:  Inpatient Procedure: 2D Echo Indications:    CHF 428  History:        Patient has prior history of Echocardiogram examinations, most  recent 01/09/2019. CHF, COPD, Arrythmias:Atrial Fibrillation;                 Risk Factors:Diabetes and Former Smoker. Covid +. cor pulmonale.                 pulmonary embolism.  Sonographer:    Celene Skeen RDCS (AE) Referring Phys: [id number] John Giovanni  Sonographer Comments: Patient is morbidly obese. Image acquisition challenging due to patient body habitus and Image acquisition challenging due to respiratory motion. limited mobility IMPRESSIONS  1. Left ventricular ejection fraction, by estimation, is 60 to 65%. The left ventricle has normal function. The left  ventricle has no regional wall motion abnormalities. Left ventricular diastolic parameters are indeterminate.  2. Right ventricular systolic function was not well visualized. The right ventricular size is not well visualized.  3. Left atrial size was moderately dilated.  4. Right atrial size was mild to moderately dilated.  5. The mitral valve is normal in structure. No evidence of mitral valve regurgitation. No evidence of mitral stenosis.  6. The aortic valve was not well visualized. Aortic valve regurgitation is trivial. Mild to moderate aortic valve sclerosis/calcification is present, without any evidence of aortic stenosis.  7. The inferior vena cava not well seen. FINDINGS  Left Ventricle: Left ventricular ejection fraction, by estimation, is 60 to 65%. The left ventricle has normal function. The left ventricle has no regional wall motion abnormalities. The left ventricular internal cavity size was normal in size. There is  no left ventricular hypertrophy. Left ventricular diastolic parameters are indeterminate. Right Ventricle: The right ventricular size is not well visualized. Right vetricular wall thickness was not assessed. Right ventricular systolic function was not well visualized. Left Atrium: Left atrial size was moderately dilated. Right Atrium: Right atrial size was mild to moderately dilated. Pericardium: There is no evidence of pericardial effusion. Mitral Valve: The mitral valve is normal in structure. There is mild thickening of the mitral valve leaflet(s). There is mild calcification of the mitral valve leaflet(s). Normal mobility of the mitral valve leaflets. Moderate mitral annular calcification. No evidence of mitral valve regurgitation. No evidence of mitral valve stenosis. Tricuspid Valve: The tricuspid valve is normal in structure. Tricuspid valve regurgitation is mild . No evidence of tricuspid stenosis. Aortic Valve: The aortic valve was not well visualized. Aortic valve regurgitation is  trivial. Mild to moderate aortic valve sclerosis/calcification is present, without any evidence of aortic stenosis. Pulmonic Valve: The pulmonic valve was normal in structure. Pulmonic valve regurgitation is not visualized. No evidence of pulmonic stenosis. Aorta: The aortic root is normal in size and structure. Venous: The inferior vena cava not well seen. IAS/Shunts: The interatrial septum was not well visualized.  LEFT VENTRICLE PLAX 2D LVIDd:         5.30 cm LVIDs:         3.40 cm LV PW:         1.10 cm LV IVS:        1.00 cm LVOT diam:     1.80 cm LV SV:         38 LV SV Index:   16 LVOT Area:     2.54 cm  RIGHT VENTRICLE RV S prime:     14.30 cm/s TAPSE (M-mode): 1.9 cm LEFT ATRIUM           Index       RIGHT ATRIUM           Index LA diam:  4.80 cm 2.06 cm/m  RA Area:     17.70 cm LA Vol (A4C): 44.0 ml 18.88 ml/m RA Volume:   45.20 ml  19.39 ml/m  AORTIC VALVE LVOT Vmax:   83.90 cm/s LVOT Vmean:  59.500 cm/s LVOT VTI:    0.150 m  AORTA Ao Root diam: 3.40 cm MITRAL VALVE MV Area (PHT): 3.48 cm    SHUNTS MV Decel Time: 218 msec    Systemic VTI:  0.15 m MV E velocity: 73.70 cm/s  Systemic Diam: 1.80 cm Jenkins Rouge MD Electronically signed by Jenkins Rouge MD Signature Date/Time: 02/17/2020/3:39:01 PM    Final    VAS [place] LOWER EXTREMITY VENOUS (DVT)  Result Date: 02/18/2020  Lower Venous DVTStudy Indications: Swelling, and Edema.  Comparison Study: no prior Performing Technologist: Abram Sander RVS  Examination Guidelines: A complete evaluation includes B-mode imaging, spectral Doppler, color Doppler, and power Doppler as needed of all accessible portions of each vessel. Bilateral testing is considered an integral part of a complete examination. Limited examinations for reoccurring indications may be performed as noted. The reflux portion of the exam is performed with the patient in reverse Trendelenburg.  +---------+---------------+---------+-----------+----------+--------------+ RIGHT     CompressibilityPhasicitySpontaneityPropertiesThrombus Aging +---------+---------------+---------+-----------+----------+--------------+ CFV      Full           Yes      Yes                                 +---------+---------------+---------+-----------+----------+--------------+ SFJ      Full                                                        +---------+---------------+---------+-----------+----------+--------------+ FV Prox  Full                                                        +---------+---------------+---------+-----------+----------+--------------+ FV Mid   Full                                                        +---------+---------------+---------+-----------+----------+--------------+ FV DistalFull                                                        +---------+---------------+---------+-----------+----------+--------------+ PFV      Full                                                        +---------+---------------+---------+-----------+----------+--------------+ POP      Full           Yes      Yes                                 +---------+---------------+---------+-----------+----------+--------------+  PTV      Full                                                        +---------+---------------+---------+-----------+----------+--------------+ PERO     Full                                                        +---------+---------------+---------+-----------+----------+--------------+   +---------+---------------+---------+-----------+----------+--------------+ LEFT     CompressibilityPhasicitySpontaneityPropertiesThrombus Aging +---------+---------------+---------+-----------+----------+--------------+ CFV      Full           Yes      Yes                                 +---------+---------------+---------+-----------+----------+--------------+ SFJ      Full                                                         +---------+---------------+---------+-----------+----------+--------------+ FV Prox  Full                                                        +---------+---------------+---------+-----------+----------+--------------+ FV Mid   Full                                                        +---------+---------------+---------+-----------+----------+--------------+ FV DistalFull                                                        +---------+---------------+---------+-----------+----------+--------------+ PFV      Full                                                        +---------+---------------+---------+-----------+----------+--------------+ POP      Full           Yes      Yes                                 +---------+---------------+---------+-----------+----------+--------------+ PTV      Full                                                        +---------+---------------+---------+-----------+----------+--------------+   PERO     Full                                                        +---------+---------------+---------+-----------+----------+--------------+     Summary: BILATERAL: - No evidence of deep vein thrombosis seen in the lower extremities, bilaterally. -   *See table(s) above for measurements and observations. Electronically signed by Monica Martinez MD on 02/18/2020 at 5:31:51 PM.    Final

## 2020-02-26 NOTE — Progress Notes (Signed)
ANTICOAGULATION CONSULT NOTE  Pharmacy Consult for Coumadin Indication: history of atrial fibrillation and PE  Allergies  Allergen Reactions  . Pseudoephedrine Other (See Comments)    REACTION: tightness in chest  . Augmentin [Amoxicillin-Pot Clavulanate] Itching and Rash    Did it involve swelling of the face/tongue/throat, SOB, or low BP? No Did it involve sudden or severe rash/hives, skin peeling, or any reaction on the inside of your mouth or nose? Yes Did you need to seek medical attention at a hospital or doctor's office? Yes When did it last happen?2019 If all above answers are "NO", may proceed with cephalosporin use.   . Latex Itching and Rash    Patient Measurements: Height: 5' 5.5" (166.4 cm) Weight: (!) 136.7 kg (301 lb 5.9 oz) IBW/kg (Calculated) : 58.15  Vital Signs: Temp: 98.6 F (37 C) (05/28 0400) Temp Source: Oral (05/28 0400) BP: 129/65 (05/28 0400) Pulse Rate: 72 (05/28 0400)  Labs: Recent Labs    02/24/20 0848 02/24/20 0848 02/25/20 0228 02/26/20 0337  HGB 12.2   < > 11.1* 10.7*  HCT 38.5  --  34.7* 33.8*  PLT 200  --  206 199  LABPROT 24.4*  --  30.9* 30.8*  INR 2.3*  --  3.1* 3.1*  CREATININE 1.52*  --  1.65* 1.70*   < > = values in this interval not displayed.    Estimated Creatinine Clearance: 47.3 mL/min (A) (by C-G formula based on SCr of 1.7 mg/dL (H)).   Assessment: 50 YOF admitted with Covid, to continue Coumadin for history of Afib and PE.  Patient had [facility] clinic visit on Feb 22, 2020 with INR at 4.4.  INR just slightly SUPRA-therapeutic at 3.1 today after receiving 5mg  on 5/25. Stable from yesterday after low dose of 1mg .  CBC is stable.  No bleeding reported. Intake 75-100% recorded -improving. Home Coumadin dose: 2.5mg  daily except 5mg  on Mon  Goal of Therapy:  INR 2-3   Plan:   Coumadin 1mg  PO again today to keep from dropping too low *Suspect can increase back to 2.5mg  daily tomorrow Daily PT / INR  ,  PharmD, BCPS, BCCCP Clinical Pharmacist Please refer to [facility] for [facility] numbers 02/26/2020, 7:40 AM

## 2020-02-26 NOTE — Progress Notes (Signed)
eLink Physician-Brief Progress Note Patient Name: Amy Bryant DOB: 17-Jan-1956 MRN: [id number]   Date of Service  02/26/2020  HPI/Events of Note  Epigastric discomfort that patient feels is her typical gas pain, usually relieved by Gas-X, she had hamburger tonight and this is a known precipitant of dyspepsia for her, Saturation and  Shortness of breath are at baseline for her.  eICU Interventions  Gas-X PRN symptoms, she's been instructed to notify RN if her symptoms do not resolve with antacid.        Thomasene Lot Ogan 02/26/2020, 9:21 PM

## 2020-02-27 LAB — CBC WITH DIFFERENTIAL/PLATELET
Abs Immature Granulocytes: 0.18 10*3/uL — ABNORMAL HIGH (ref 0.00–0.07)
Basophils Absolute: 0 10*3/uL (ref 0.0–0.1)
Basophils Relative: 0 %
Eosinophils Absolute: 0.2 10*3/uL (ref 0.0–0.5)
Eosinophils Relative: 1 %
HCT: 34 % — ABNORMAL LOW (ref 36.0–46.0)
Hemoglobin: 10.6 g/dL — ABNORMAL LOW (ref 12.0–15.0)
Immature Granulocytes: 1 %
Lymphocytes Relative: 7 %
Lymphs Abs: 1 10*3/uL (ref 0.7–4.0)
MCH: 29.6 pg (ref 26.0–34.0)
MCHC: 31.2 g/dL (ref 30.0–36.0)
MCV: 95 fL (ref 80.0–100.0)
Monocytes Absolute: 0.6 10*3/uL (ref 0.1–1.0)
Monocytes Relative: 4 %
Neutro Abs: 12 10*3/uL — ABNORMAL HIGH (ref 1.7–7.7)
Neutrophils Relative %: 87 %
Platelets: 168 10*3/uL (ref 150–400)
RBC: 3.58 MIL/uL — ABNORMAL LOW (ref 3.87–5.11)
RDW: 14.2 % (ref 11.5–15.5)
WBC: 13.9 10*3/uL — ABNORMAL HIGH (ref 4.0–10.5)
nRBC: 0 % (ref 0.0–0.2)

## 2020-02-27 LAB — COMPREHENSIVE METABOLIC PANEL
ALT: 11 U/L (ref 0–44)
AST: 11 U/L — ABNORMAL LOW (ref 15–41)
Albumin: 2.6 g/dL — ABNORMAL LOW (ref 3.5–5.0)
Alkaline Phosphatase: 67 U/L (ref 38–126)
Anion gap: 6 (ref 5–15)
BUN: 50 mg/dL — ABNORMAL HIGH (ref 8–23)
CO2: 30 mmol/L (ref 22–32)
Calcium: 9.4 mg/dL (ref 8.9–10.3)
Chloride: 102 mmol/L (ref 98–111)
Creatinine, Ser: 1.78 mg/dL — ABNORMAL HIGH (ref 0.44–1.00)
GFR calc Af Amer: 34 mL/min — ABNORMAL LOW (ref >60)
GFR calc non Af Amer: 30 mL/min — ABNORMAL LOW (ref >60)
Glucose, Bld: 136 mg/dL — ABNORMAL HIGH (ref 70–99)
Potassium: 4.1 mmol/L (ref 3.5–5.1)
Sodium: 138 mmol/L (ref 135–145)
Total Bilirubin: 0.7 mg/dL (ref 0.3–1.2)
Total Protein: 5.1 g/dL — ABNORMAL LOW (ref 6.5–8.1)

## 2020-02-27 LAB — MAGNESIUM: Magnesium: 2.1 mg/dL (ref 1.7–2.4)

## 2020-02-27 LAB — GLUCOSE, CAPILLARY
Glucose-Capillary: 111 mg/dL — ABNORMAL HIGH (ref 70–99)
Glucose-Capillary: 125 mg/dL — ABNORMAL HIGH (ref 70–99)
Glucose-Capillary: 205 mg/dL — ABNORMAL HIGH (ref 70–99)
Glucose-Capillary: 159 mg/dL — ABNORMAL HIGH (ref 70–99)

## 2020-02-27 LAB — PROTIME-INR
INR: 2.6 — ABNORMAL HIGH (ref 0.8–1.2)
Prothrombin Time: 27 seconds — ABNORMAL HIGH (ref 11.4–15.2)

## 2020-02-27 LAB — BRAIN NATRIURETIC PEPTIDE: B Natriuretic Peptide: 101.8 pg/mL — ABNORMAL HIGH (ref 0.0–100.0)

## 2020-02-27 LAB — PROCALCITONIN: Procalcitonin: <0.1 ng/mL

## 2020-02-27 MED ORDER — FUROSEMIDE 40 MG PO TABS
40.0000 mg | ORAL_TABLET | Freq: Once | ORAL | Status: AC
  Administered 2020-02-27: 40 mg via ORAL
  Filled 2020-02-27: qty 1

## 2020-02-27 MED ORDER — WARFARIN SODIUM 2.5 MG PO TABS
2.5000 mg | ORAL_TABLET | Freq: Once | ORAL | Status: AC
  Administered 2020-02-27: 2.5 mg via ORAL
  Filled 2020-02-27: qty 1

## 2020-02-27 MED ORDER — DILTIAZEM HCL ER COATED BEADS 120 MG PO CP24
120.0000 mg | ORAL_CAPSULE | Freq: Every day | ORAL | Status: DC
  Administered 2020-02-27 – 2020-03-01 (×4): 120 mg via ORAL
  Filled 2020-02-27 (×5): qty 1

## 2020-02-27 NOTE — Progress Notes (Signed)
PROGRESS NOTE                                                                                                                                                                                                             Patient Demographics:    Amy Bryant, is a 64 y.o. female, DOB - 26-Jan-1956, ZOX:[id number]  Outpatient Primary MD for the patient is [name], Audrie GallusMarne A, MD   Admit date - 02/22/2020   LOS - 11  CC - SOB     Brief Narrative:  Patient is a 64 y.o. female with PMHx of COPD with chronic hypoxic respiratory failure on 4 L of oxygen at home, chronic diastolic heart failure, PAF on Coumadin, history of PE, CKD stage IIIb, non-insulin-dependent DM-2, morbid obesity-who presented to the hospital with approximately 2-3-day history of worsening shortness of breath.  For approximately 1 week-she has been having nasal congestion/URI symptoms-her PCP called in prednisone and Zithromax which she took without any relief.  In the emergency room-she was noted to have severe hypoxemia-requiring NRB-she was subsequently admitted to the hospitalist service.  Note-has not received the Covid vaccine.  Significant Events: 5/18>> admit to [facility] for severe hypoxemia requiring nonrebreather mask. 5/19>> on heated high flow 5/20>> on heated high flow+ NRB-transfer to MICU  COVID-19 medications: Steroids: 5/18>> Remdesivir: 5/18>>5/22 Actemra: 5/18 x 1  Antibiotics: None  Microbiology data: 5/18: Blood culture>> no growth  Significant studies: 5/20: Bilateral lower extremity Doppler>> negative for DVT. 5/19: Echo>> EF 60-65% 5/19: Chest x-ray>> interstitial pulmonary edema/patchy airspace opacity in the left base 5/18: Chest x-ray>> new diffuse interstitial thickening/reticular opacities suspicious for pulmonary edema  DVT prophylaxis: Coumadin-supratherapeutic INR.  Procedures: None  Consults: PCCM    Subjective:    Patient in bed, appears comfortable, denies any headache, no fever, no chest pain or pressure, +ve shortness of breath , no abdominal pain. No focal weakness.    Assessment  & Plan :   Acute on chronic hypoxic Resp Failure due to Covid 19 Viral pneumonia and decompensated diastolic heart failure: She had severe disease and likely presented once her parenchymal damage was quite advanced due to COVID-19 pneumonia and inflammation, received appropriate care with IV steroids, remdesivir and Actemra.  She has finished her steroid course along with IV Actemra and remdesivir. She is finally feeling better and clinically gradually improving  although still on heated high flow around 35 L - 40 L with 100% FiO2 with intermittent use of nonrebreather mask but there is gradual improvement.  Encouraged her to sit up in the chair in the daytime and use I-S and flutter valve for pulmonary toiletry.  She has incurred severe parenchymal lung injury from COVID-19 pneumonia and will take several weeks before she can come back to her baseline, continue heated high flow oxygen and monitoring in ICU, diurese with as needed Lasix as needed.   O2 requirements:  SpO2: (!) 87 % O2 Flow Rate (L/min): 35 L/min FiO2 (%): 100 %   COVID-19 Labs: No results for input(s): DDIMER, FERRITIN, LDH, CRP in the last 72 hours.     Component Value Date/Time   BNP 101.8 (H) 02/27/2020 0604    Recent Labs  Lab 02/21/20 0105 02/22/20 0656 02/23/20 0441  PROCALCITON <0.10 <0.10 <0.10    Lab Results  Component Value Date   SARSCOV2NAA POSITIVE (A) 02/25/2020   SARSCOV2NAA NEGATIVE 10/06/2019   SARSCOV2NAA NOT DETECTED 04/08/2019   SARSCOV2NAA NOT DETECTED 04/02/2019    Decompensated acute on chronic diastolic heart failure EF 60%:  Lasix as needed.  Will give a dose on 02/27/2020.  Elevated D-dimer: On chronic Coumadin, INR in good range and D-dimer now trending down, lower extremity venous duplex unremarkable.  COPD:  Not in exacerbation this morning-no wheezing-decrease steroids to twice daily dosing.  Continue bronchodilators.  Normally on 4 L of oxygen at all times at home.  AKI on CKD stage IIIb: Baseline creatinine around 1.8, monitor cautiously with diuresis, [place] close to baseline.  PAF with [place] vas 2 score of at least 3 - goal is rate control, blood pressure slightly soft hence Cardizem dose dropped further on 02/27/2020, pharmacy monitoring Coumadin and INR.  Remote history of pulmonary embolism: on Coumadin pharmacy following.  Hypothyroidism: Continue Synthroid  Depression/anxiety: Slightly anxious due to acute illness/hypoxia/oxygen requirement-continue as needed Xanax and Prozac.  DM-2 (A1c 5.2 on 10/07/2019): CBGs stable, Lantus dose dropped on 02/25/2020 as steroids are now tapered off.  Will monitor closely.  Recent Labs    02/26/20 1654 02/26/20 2218 02/27/20 0734  GLUCAP 115* 163* 111*   Morbid Obesity: BMI of 48 follow with PCP for weight loss.    GI prophylaxis: PPI  Condition - Extremely Guarded-very tenuous with risk for further deterioration  Family Communication  : Prefers that she update her family-she does not want me to call her mother or her son-she fears that her family will panic   Code Status :  Full Code  Diet :  Diet Order            Diet heart healthy/carb modified Room service appropriate? Yes; Fluid consistency: Thin; Fluid restriction: 1200 mL Fluid  Diet effective now               Disposition Plan  :   Status is: Inpatient  Remains inpatient appropriate because:Inpatient level of care appropriate due to severity of illness   Dispo: The patient is from: Home              Anticipated d/c is to: SNF              Anticipated d/c date is: > 3 days              Patient currently is not medically stable to d/c.  Barriers to discharge: Severe Hypoxia requiring +++ O2 supplementation  Antimicorbials  :  Anti-infectives (From admission,  onward)   Start     Dose/Rate Route Frequency Ordered Stop   02/17/20 1000  remdesivir 100 mg in sodium chloride 0.9 % 100 mL IVPB     100 mg 200 mL/hr over 30 Minutes Intravenous Daily 02/01/2020 2202 02/20/20 0955   02/20/2020 2215  remdesivir 200 mg in sodium chloride 0.9% 250 mL IVPB     200 mg 580 mL/hr over 30 Minutes Intravenous Once 02/13/2020 2202 02/04/2020 2319      Inpatient Medications  Scheduled Meds: . vitamin C  500 mg Oral Daily  . Chlorhexidine Gluconate Cloth  6 each Topical Daily  . cholecalciferol  1,000 Units Oral Daily  . diltiazem  120 mg Oral Daily  . FLUoxetine  40 mg Oral Daily  . fluticasone  1 spray Each Nare Daily  . fluticasone furoate-vilanterol  1 puff Inhalation Daily  . furosemide  40 mg Oral Once  . insulin aspart  0-5 Units Subcutaneous QHS  . insulin aspart  0-9 Units Subcutaneous TID WC  . insulin detemir  8 Units Subcutaneous Daily  . iron polysaccharides  300 mg Oral Daily  . levothyroxine  175 mcg Oral Q0600  . pantoprazole  40 mg Oral Q1200  . senna-docusate  1 tablet Oral BID  . simvastatin  20 mg Oral QHS  . umeclidinium bromide  1 puff Inhalation Daily  . Warfarin - Pharmacist Dosing Inpatient   Does not apply q1600  . zinc sulfate  220 mg Oral Daily   Continuous Infusions:  PRN Meds:.acetaminophen, albuterol, ALPRAZolam, chlorpheniramine-HYDROcodone, diltiazem, docusate sodium, guaiFENesin-dextromethorphan, lip balm, oxymetazoline, simethicone, sodium chloride, traMADol   Time Spent in minutes  35    See all Orders from today for further details   Susa Raring M.D on 02/27/2020 at 9:00 AM  To page go to [email or web address] - use universal password  [facility] -  Office  [phone]    Objective:   Vitals:   02/27/20 0600 02/27/20 0700 02/27/20 0736 02/27/20 0758  BP: 117/68 122/71    Pulse: 73 74  73  Resp: 17 15  (!) 22  Temp:   98.1 F (36.7 C)   TempSrc:   Axillary   SpO2: 98% 98%  (!) 87%  Weight:        Height:        Wt Readings from Last 3 Encounters:  02/27/20 133.8 kg  01/28/20 132 kg  01/06/20 132 kg     Intake/Output Summary (Last 24 hours) at 02/27/2020 0900 Last data filed at 02/27/2020 0500 Gross per 24 hour  Intake 1080 ml  Output 2300 ml  Net -1220 ml     Physical Exam  Awake Alert, No new F.N deficits, Normal affect [place].AT,PERRAL Supple Neck,No JVD, No cervical lymphadenopathy appriciated.  Symmetrical Chest wall movement, Good air movement bilaterally, few rales RRR,No Gallops, Rubs or new Murmurs, No Parasternal Heave +ve B.Sounds, Abd Soft, No tenderness, No organomegaly appriciated, No rebound - guarding or rigidity. No Cyanosis, Clubbing or edema, No new Rash or bruise     Data Review:    CBC Recent Labs  Lab 02/23/20 0441 02/24/20 0848 02/25/20 0228 02/26/20 0337 02/27/20 0604  WBC 11.6* 11.6* 14.6* 12.4* 13.9*  HGB 11.7* 12.2 11.1* 10.7* 10.6*  HCT 37.0 38.5 34.7* 33.8* 34.0*  PLT 292 200 206 199 168  MCV 92.5 92.1 92.5 93.4 95.0  MCH 29.3 29.2 29.6 29.6 29.6  MCHC 31.6 31.7 32.0 31.7 31.2  RDW 13.3 13.7  13.6 13.7 14.2  LYMPHSABS 0.6* 0.7 0.5* 0.5* 1.0  MONOABS 0.5 0.6 0.6 0.6 0.6  EOSABS 0.0 0.1 0.0 0.0 0.2  BASOSABS 0.0 0.0 0.0 0.0 0.0    Chemistries  Recent Labs  Lab 02/23/20 0441 02/24/20 0848 02/25/20 0228 02/26/20 0337 02/27/20 0604  NA 137 137 138 135 138  K 4.3 4.3 4.8 4.4 4.1  CL 101 103 103 99 102  CO2 26 25 29 27 30   GLUCOSE 104* 78 131* 190* 136*  BUN 71* 59* 57* 53* 50*  CREATININE 1.82* 1.52* 1.65* 1.70* 1.78*  CALCIUM 9.2 9.3 9.5 9.7 9.4  MG 2.4 QUANTITY NOT SUFFICIENT, UNABLE TO PERFORM TEST 2.2 2.3 2.1  AST 13* 12* 11* 12* 11*  ALT 11 8 12 12 11   ALKPHOS 68 74 68 82 67  BILITOT 0.7 QUANTITY NOT SUFFICIENT, UNABLE TO PERFORM TEST 0.4 0.4 0.7   ------------------------------------------------------------------------------------------------------------------ No results for input(s): CHOL, HDL, LDLCALC,  TRIG, CHOLHDL, LDLDIRECT in the last 72 hours.  Lab Results  Component Value Date   HGBA1C 5.8 (H) 02/17/2020   ------------------------------------------------------------------------------------------------------------------ No results for input(s): TSH, T4TOTAL, T3FREE, THYROIDAB in the last 72 hours.  Invalid input(s): FREET3 ------------------------------------------------------------------------------------------------------------------ No results for input(s): VITAMINB12, FOLATE, FERRITIN, TIBC, IRON, RETICCTPCT in the last 72 hours.  Coagulation profile Recent Labs  Lab 02/23/20 0441 02/24/20 0848 02/25/20 0228 02/26/20 0337 02/27/20 0604  INR 2.1* 2.3* 3.1* 3.1* 2.6*    No results for input(s): DDIMER in the last 72 hours.  Cardiac Enzymes No results for input(s): CKMB, TROPONINI, MYOGLOBIN in the last 168 hours.  Invalid input(s): CK ------------------------------------------------------------------------------------------------------------------    Component Value Date/Time   BNP 101.8 (H) 02/27/2020 0604    Micro Results Recent Results (from the past 240 hour(s))  MRSA PCR Screening     Status: None   Collection Time: 02/24/20 10:25 AM   Specimen: Nasal Mucosa; Nasopharyngeal  Result Value Ref Range Status   MRSA by PCR NEGATIVE NEGATIVE Final    Comment:        The GeneXpert MRSA Assay (FDA approved for NASAL specimens only), is one component of a comprehensive MRSA colonization surveillance program. It is not intended to diagnose MRSA infection nor to guide or monitor treatment for MRSA infections. Performed at [facility], [address], [place], [address] [place]     Radiology Reports DG Chest [place] 1 View  Result Date: 02/26/2020 CLINICAL DATA:  64 year old female with respiratory failure. Chronic lung disease. EXAM: PORTABLE CHEST 1 VIEW COMPARISON:  Portable chest 02/23/2020 and earlier. FINDINGS: Portable AP upright view at 0806  hours. Stable cardiomegaly and mediastinal contours. Visualized tracheal air column is within normal limits. Coarse and confluent bilateral pulmonary opacity, basilar predominant. No superimposed pneumothorax or pleural effusion. Although bilateral lung base ventilation appears mildly worsened since 02/20/2020. No acute osseous abnormality identified. IMPRESSION: Chronic lung disease and cardiomegaly with basilar predominant abnormal pulmonary opacity most suggestive of pneumonia. Mild progression since 02/20/2020. No pleural effusion identified. Electronically Signed   By: 02/22/2020 M.D.   On: 02/26/2020 08:36   DG Chest Port 1 View  Result Date: 02/23/2020 CLINICAL DATA:  COVID pneumonia. EXAM: PORTABLE CHEST 1 VIEW COMPARISON:  One-view chest x-ray 02/20/2020 FINDINGS: Heart is enlarged. Atherosclerotic calcifications are present at the aortic arch. Diffuse interstitial and airspace disease is present. Consolidation at the left base is slightly less dense. A diffuse interstitial pattern has increased some. IMPRESSION: 1. Diffuse interstitial and airspace disease compatible with pneumonia.  An element of edema is not excluded. 2. Consolidation at the left base is slightly less dense. Electronically Signed   By: San Morelle M.D.   On: 02/23/2020 08:49   DG Chest Port 1 View  Result Date: 02/20/2020 CLINICAL DATA:  Shortness of breath, history of COVID-19 positivity EXAM: PORTABLE CHEST 1 VIEW COMPARISON:  02/17/2020 FINDINGS: Cardiac shadow is stable but enlarged. Aortic calcifications are seen. Patchy opacities are noted primarily in the left base consistent with the given clinical history. Mild vascular congestion is noted centrally. No other focal abnormality is seen. IMPRESSION: Mild vascular congestion. Persistent opacity in the left base consistent with the given clinical history. Electronically Signed   By: Inez Catalina M.D.   On: 02/20/2020 08:29   DG Chest Port 1 View  Result Date:  02/25/2020 CLINICAL DATA:  Dyspnea, fever. EXAM: PORTABLE CHEST 1 VIEW COMPARISON:  Radiograph 10/06/2019 FINDINGS: Significant interval change from prior exam with diffuse interstitial thickening and reticular opacities. There is also perivascular haziness. Cardiomegaly with unchanged mediastinal contours. Aortic atherosclerosis. Possible small pleural effusions. No visualized pneumothorax. IMPRESSION: New diffuse interstitial thickening and reticular opacities suspicious for pulmonary edema. Similar cardiomegaly. Possible small pleural effusions. Findings suspicious for CHF. Electronically Signed   By: Keith Rake M.D.   On: 02/04/2020 18:11   DG Chest Port 1V same Day  Result Date: 02/17/2020 CLINICAL DATA:  Shortness of breath EXAM: PORTABLE CHEST 1 VIEW COMPARISON:  Feb 16, 2020 FINDINGS: There is cardiomegaly with pulmonary venous hypertension. There is interstitial edema with patchy airspace opacity in the left base. No adenopathy. There is aortic atherosclerosis. No bone lesions. IMPRESSION: Cardiomegaly with pulmonary vascular congestion. There is interstitial pulmonary edema. Suspect a degree of congestive heart failure. Patchy airspace opacity in the left base is concerning for superimposed pneumonia, although alveolar edema in this area could present in this manner. Both pneumonia and alveolar edema could present concurrently. In comparison with 1 day prior, there appears to be somewhat less interstitial edema. Aortic Atherosclerosis (ICD10-I70.0). Electronically Signed   By: Lowella Grip III M.D.   On: 02/17/2020 08:01   ECHOCARDIOGRAM COMPLETE  Result Date: 02/17/2020    ECHOCARDIOGRAM REPORT   Patient Name:   DASHAUNA HEYMANN Date of Exam: 02/17/2020 Medical Rec #:  [id number]      Height:       65.5 in Accession #:    [id number]     Weight:       290.3 lb Date of Birth:  1956/07/27      BSA:          2.331 m Patient Age:    52 years       BP:           120/67 mmHg Patient Gender: F               HR:           75 bpm. Exam Location:  Inpatient Procedure: 2D Echo Indications:    CHF 428  History:        Patient has prior history of Echocardiogram examinations, most                 recent 01/09/2019. CHF, COPD, Arrythmias:Atrial Fibrillation;                 Risk Factors:Diabetes and Former Smoker. Covid +. cor pulmonale.                 pulmonary embolism.  Sonographer:  Celene Skeen RDCS (AE) Referring Phys: [id number] John Giovanni  Sonographer Comments: Patient is morbidly obese. Image acquisition challenging due to patient body habitus and Image acquisition challenging due to respiratory motion. limited mobility IMPRESSIONS  1. Left ventricular ejection fraction, by estimation, is 60 to 65%. The left ventricle has normal function. The left ventricle has no regional wall motion abnormalities. Left ventricular diastolic parameters are indeterminate.  2. Right ventricular systolic function was not well visualized. The right ventricular size is not well visualized.  3. Left atrial size was moderately dilated.  4. Right atrial size was mild to moderately dilated.  5. The mitral valve is normal in structure. No evidence of mitral valve regurgitation. No evidence of mitral stenosis.  6. The aortic valve was not well visualized. Aortic valve regurgitation is trivial. Mild to moderate aortic valve sclerosis/calcification is present, without any evidence of aortic stenosis.  7. The inferior vena cava not well seen. FINDINGS  Left Ventricle: Left ventricular ejection fraction, by estimation, is 60 to 65%. The left ventricle has normal function. The left ventricle has no regional wall motion abnormalities. The left ventricular internal cavity size was normal in size. There is  no left ventricular hypertrophy. Left ventricular diastolic parameters are indeterminate. Right Ventricle: The right ventricular size is not well visualized. Right vetricular wall thickness was not assessed. Right ventricular  systolic function was not well visualized. Left Atrium: Left atrial size was moderately dilated. Right Atrium: Right atrial size was mild to moderately dilated. Pericardium: There is no evidence of pericardial effusion. Mitral Valve: The mitral valve is normal in structure. There is mild thickening of the mitral valve leaflet(s). There is mild calcification of the mitral valve leaflet(s). Normal mobility of the mitral valve leaflets. Moderate mitral annular calcification. No evidence of mitral valve regurgitation. No evidence of mitral valve stenosis. Tricuspid Valve: The tricuspid valve is normal in structure. Tricuspid valve regurgitation is mild . No evidence of tricuspid stenosis. Aortic Valve: The aortic valve was not well visualized. Aortic valve regurgitation is trivial. Mild to moderate aortic valve sclerosis/calcification is present, without any evidence of aortic stenosis. Pulmonic Valve: The pulmonic valve was normal in structure. Pulmonic valve regurgitation is not visualized. No evidence of pulmonic stenosis. Aorta: The aortic root is normal in size and structure. Venous: The inferior vena cava not well seen. IAS/Shunts: The interatrial septum was not well visualized.  LEFT VENTRICLE PLAX 2D LVIDd:         5.30 cm LVIDs:         3.40 cm LV PW:         1.10 cm LV IVS:        1.00 cm LVOT diam:     1.80 cm LV SV:         38 LV SV Index:   16 LVOT Area:     2.54 cm  RIGHT VENTRICLE RV S prime:     14.30 cm/s TAPSE (M-mode): 1.9 cm LEFT ATRIUM           Index       RIGHT ATRIUM           Index LA diam:      4.80 cm 2.06 cm/m  RA Area:     17.70 cm LA Vol (A4C): 44.0 ml 18.88 ml/m RA Volume:   45.20 ml  19.39 ml/m  AORTIC VALVE LVOT Vmax:   83.90 cm/s LVOT Vmean:  59.500 cm/s LVOT VTI:    0.150 m  AORTA Ao  Root diam: 3.40 cm MITRAL VALVE MV Area (PHT): 3.48 cm    SHUNTS MV Decel Time: 218 msec    Systemic VTI:  0.15 m MV E velocity: 73.70 cm/s  Systemic Diam: 1.80 cm Charlton Haws MD Electronically  signed by Charlton Haws MD Signature Date/Time: 02/17/2020/3:39:01 PM    Final    VAS [place] LOWER EXTREMITY VENOUS (DVT)  Result Date: 02/18/2020  Lower Venous DVTStudy Indications: Swelling, and Edema.  Comparison Study: no prior Performing Technologist: Blanch Media RVS  Examination Guidelines: A complete evaluation includes B-mode imaging, spectral Doppler, color Doppler, and power Doppler as needed of all accessible portions of each vessel. Bilateral testing is considered an integral part of a complete examination. Limited examinations for reoccurring indications may be performed as noted. The reflux portion of the exam is performed with the patient in reverse Trendelenburg.  +---------+---------------+---------+-----------+----------+--------------+ RIGHT    CompressibilityPhasicitySpontaneityPropertiesThrombus Aging +---------+---------------+---------+-----------+----------+--------------+ CFV      Full           Yes      Yes                                 +---------+---------------+---------+-----------+----------+--------------+ SFJ      Full                                                        +---------+---------------+---------+-----------+----------+--------------+ FV Prox  Full                                                        +---------+---------------+---------+-----------+----------+--------------+ FV Mid   Full                                                        +---------+---------------+---------+-----------+----------+--------------+ FV DistalFull                                                        +---------+---------------+---------+-----------+----------+--------------+ PFV      Full                                                        +---------+---------------+---------+-----------+----------+--------------+ POP      Full           Yes      Yes                                  +---------+---------------+---------+-----------+----------+--------------+ PTV      Full                                                        +---------+---------------+---------+-----------+----------+--------------+  PERO     Full                                                        +---------+---------------+---------+-----------+----------+--------------+   +---------+---------------+---------+-----------+----------+--------------+ LEFT     CompressibilityPhasicitySpontaneityPropertiesThrombus Aging +---------+---------------+---------+-----------+----------+--------------+ CFV      Full           Yes      Yes                                 +---------+---------------+---------+-----------+----------+--------------+ SFJ      Full                                                        +---------+---------------+---------+-----------+----------+--------------+ FV Prox  Full                                                        +---------+---------------+---------+-----------+----------+--------------+ FV Mid   Full                                                        +---------+---------------+---------+-----------+----------+--------------+ FV DistalFull                                                        +---------+---------------+---------+-----------+----------+--------------+ PFV      Full                                                        +---------+---------------+---------+-----------+----------+--------------+ POP      Full           Yes      Yes                                 +---------+---------------+---------+-----------+----------+--------------+ PTV      Full                                                        +---------+---------------+---------+-----------+----------+--------------+ PERO     Full                                                         +---------+---------------+---------+-----------+----------+--------------+  Summary: BILATERAL: - No evidence of deep vein thrombosis seen in the lower extremities, bilaterally. -   *See table(s) above for measurements and observations. Electronically signed by Sherald Hess MD on 02/18/2020 at 5:31:51 PM.    Final

## 2020-02-27 NOTE — Progress Notes (Signed)
ANTICOAGULATION CONSULT NOTE  Pharmacy Consult for Coumadin Indication: history of atrial fibrillation and PE  Allergies  Allergen Reactions  . Pseudoephedrine Other (See Comments)    REACTION: tightness in chest  . Augmentin [Amoxicillin-Pot Clavulanate] Itching and Rash    Did it involve swelling of the face/tongue/throat, SOB, or low BP? No Did it involve sudden or severe rash/hives, skin peeling, or any reaction on the inside of your mouth or nose? Yes Did you need to seek medical attention at a hospital or doctor's office? Yes When did it last happen?2019 If all above answers are "NO", may proceed with cephalosporin use.   . Latex Itching and Rash    Patient Measurements: Height: 5' 5.5" (166.4 cm) Weight: 133.8 kg (294 lb 15.6 oz) IBW/kg (Calculated) : 58.15  Vital Signs: Temp: 98.1 F (36.7 C) (05/29 0736) Temp Source: Axillary (05/29 0736) BP: 92/45 (05/29 0900) Pulse Rate: 82 (05/29 0900)  Labs: Recent Labs    02/25/20 0228 02/25/20 0228 02/26/20 0337 02/27/20 0604  HGB 11.1*   < > 10.7* 10.6*  HCT 34.7*  --  33.8* 34.0*  PLT 206  --  199 168  LABPROT 30.9*  --  30.8* 27.0*  INR 3.1*  --  3.1* 2.6*  CREATININE 1.65*  --  1.70* 1.78*   < > = values in this interval not displayed.    Estimated Creatinine Clearance: 44.6 mL/min (A) (by C-G formula based on SCr of 1.78 mg/dL (H)).   Assessment: 52 YOF admitted with Covid, to continue Coumadin for history of Afib and PE.  Patient had [facility] clinic visit on 03/10/2020 with INR at 4.4.  INR therapeutic today at 2.6 after receiving 1mg  for the past 2 days. Will go back to their home dose tonight  CBC is stable.  No bleeding reported. Intake 75-100% recorded -improving. Home Coumadin dose: 2.5mg  daily except 5mg  on Mon  Goal of Therapy:  INR 2-3   Plan:   Coumadin 2.5mg  x1 Daily PT / INR  , PharmD PGY2 Infectious Disease Pharmacy Resident  Please refer to [facility] for [facility]  numbers 02/27/2020, 10:04 AM

## 2020-02-28 ENCOUNTER — Inpatient Hospital Stay (HOSPITAL_COMMUNITY): Payer: Medicare HMO

## 2020-02-28 LAB — GLUCOSE, CAPILLARY
Glucose-Capillary: 91 mg/dL (ref 70–99)
Glucose-Capillary: 133 mg/dL — ABNORMAL HIGH (ref 70–99)
Glucose-Capillary: 156 mg/dL — ABNORMAL HIGH (ref 70–99)
Glucose-Capillary: 169 mg/dL — ABNORMAL HIGH (ref 70–99)

## 2020-02-28 LAB — PROCALCITONIN: Procalcitonin: <0.1 ng/mL

## 2020-02-28 LAB — CBC WITH DIFFERENTIAL/PLATELET
Abs Immature Granulocytes: 0.13 10*3/uL — ABNORMAL HIGH (ref 0.00–0.07)
Basophils Absolute: 0 10*3/uL (ref 0.0–0.1)
Basophils Relative: 0 %
Eosinophils Absolute: 0.1 10*3/uL (ref 0.0–0.5)
Eosinophils Relative: 1 %
HCT: 36.3 % (ref 36.0–46.0)
Hemoglobin: 11 g/dL — ABNORMAL LOW (ref 12.0–15.0)
Immature Granulocytes: 1 %
Lymphocytes Relative: 4 %
Lymphs Abs: 0.7 10*3/uL (ref 0.7–4.0)
MCH: 29.3 pg (ref 26.0–34.0)
MCHC: 30.3 g/dL (ref 30.0–36.0)
MCV: 96.8 fL (ref 80.0–100.0)
Monocytes Absolute: 0.5 10*3/uL (ref 0.1–1.0)
Monocytes Relative: 3 %
Neutro Abs: 14.9 10*3/uL — ABNORMAL HIGH (ref 1.7–7.7)
Neutrophils Relative %: 91 %
Platelets: 149 10*3/uL — ABNORMAL LOW (ref 150–400)
RBC: 3.75 MIL/uL — ABNORMAL LOW (ref 3.87–5.11)
RDW: 14.6 % (ref 11.5–15.5)
WBC: 16.4 10*3/uL — ABNORMAL HIGH (ref 4.0–10.5)
nRBC: 0 % (ref 0.0–0.2)

## 2020-02-28 LAB — COMPREHENSIVE METABOLIC PANEL
ALT: 10 U/L (ref 0–44)
AST: 12 U/L — ABNORMAL LOW (ref 15–41)
Albumin: 2.6 g/dL — ABNORMAL LOW (ref 3.5–5.0)
Alkaline Phosphatase: 81 U/L (ref 38–126)
Anion gap: 10 (ref 5–15)
BUN: 51 mg/dL — ABNORMAL HIGH (ref 8–23)
CO2: 25 mmol/L (ref 22–32)
Calcium: 9.5 mg/dL (ref 8.9–10.3)
Chloride: 101 mmol/L (ref 98–111)
Creatinine, Ser: 1.59 mg/dL — ABNORMAL HIGH (ref 0.44–1.00)
GFR calc Af Amer: 39 mL/min — ABNORMAL LOW (ref >60)
GFR calc non Af Amer: 34 mL/min — ABNORMAL LOW (ref >60)
Glucose, Bld: 98 mg/dL (ref 70–99)
Potassium: 3.9 mmol/L (ref 3.5–5.1)
Sodium: 136 mmol/L (ref 135–145)
Total Bilirubin: 0.5 mg/dL (ref 0.3–1.2)
Total Protein: 5.2 g/dL — ABNORMAL LOW (ref 6.5–8.1)

## 2020-02-28 LAB — EXPECTORATED SPUTUM ASSESSMENT W GRAM STAIN, RFLX TO RESP C

## 2020-02-28 LAB — MRSA PCR SCREENING: MRSA by PCR: NEGATIVE

## 2020-02-28 LAB — BRAIN NATRIURETIC PEPTIDE: B Natriuretic Peptide: 107.1 pg/mL — ABNORMAL HIGH (ref 0.0–100.0)

## 2020-02-28 LAB — PROTIME-INR
INR: 2.2 — ABNORMAL HIGH (ref 0.8–1.2)
Prothrombin Time: 23.6 seconds — ABNORMAL HIGH (ref 11.4–15.2)

## 2020-02-28 LAB — MAGNESIUM: Magnesium: 2.4 mg/dL (ref 1.7–2.4)

## 2020-02-28 MED ORDER — FUROSEMIDE 10 MG/ML IJ SOLN
60.0000 mg | Freq: Once | INTRAMUSCULAR | Status: AC
  Administered 2020-02-28: 60 mg via INTRAVENOUS
  Filled 2020-02-28: qty 6

## 2020-02-28 MED ORDER — WARFARIN SODIUM 3 MG PO TABS
3.0000 mg | ORAL_TABLET | Freq: Once | ORAL | Status: AC
  Administered 2020-02-28: 3 mg via ORAL
  Filled 2020-02-28: qty 1

## 2020-02-28 NOTE — Progress Notes (Signed)
ANTICOAGULATION CONSULT NOTE  Pharmacy Consult for Coumadin Indication: history of atrial fibrillation and PE  Allergies  Allergen Reactions  . Pseudoephedrine Other (See Comments)    REACTION: tightness in chest  . Augmentin [Amoxicillin-Pot Clavulanate] Itching and Rash    Did it involve swelling of the face/tongue/throat, SOB, or low BP? No Did it involve sudden or severe rash/hives, skin peeling, or any reaction on the inside of your mouth or nose? Yes Did you need to seek medical attention at a hospital or doctor's office? Yes When did it last happen?2019 If all above answers are "NO", may proceed with cephalosporin use.   . Latex Itching and Rash    Patient Measurements: Height: 5' 5.5" (166.4 cm) Weight: 133.6 kg (294 lb 8.6 oz) IBW/kg (Calculated) : 58.15  Vital Signs: Temp: 98.6 F (37 C) (05/30 0700) Temp Source: Oral (05/30 0700) BP: 116/70 (05/30 0837) Pulse Rate: 86 (05/30 0837)  Labs: Recent Labs    02/26/20 0337 02/26/20 0337 02/27/20 0604 02/28/20 0805  HGB 10.7*   < > 10.6* 11.0*  HCT 33.8*  --  34.0* 36.3  PLT 199  --  168 149*  LABPROT 30.8*  --  27.0* 23.6*  INR 3.1*  --  2.6* 2.2*  CREATININE 1.70*  --  1.78* 1.59*   < > = values in this interval not displayed.    Estimated Creatinine Clearance: 49.9 mL/min (A) (by C-G formula based on SCr of 1.59 mg/dL (H)).   Assessment: 33 YOF admitted with Covid, to continue Coumadin for history of Afib and PE.  Patient had [facility] clinic visit on 02/23/20 with INR at 4.4.  INR therapeutic today at 2.2 but decreasing will give 3mg  tonight CBC is stable.  No bleeding reported. Intake 75-100% recorded -improving. Home Coumadin dose: 2.5mg  daily except 5mg  on Mon  Goal of Therapy:  INR 2-3   Plan:   Coumadin 3mg  x1 Daily PT / INR  , PharmD PGY2 Infectious Disease Pharmacy Resident  Please refer to [facility] for [facility] Pharm[facility] numbers 02/28/2020, 9:57 AM

## 2020-02-28 NOTE — Progress Notes (Signed)
PROGRESS NOTE                                                                                                                                                                                                             Patient Demographics:    Amy Bryant, is a 64 y.o. female, DOB - Sep 01, 1956, ENI:[id number]  Outpatient Primary MD for the patient is [name], Amy Gallus, MD   Admit date - 01/30/2020   LOS - 12  CC - SOB     Brief Narrative:  Patient is a 64 y.o. female with PMHx of COPD with chronic hypoxic respiratory failure on 4 L of oxygen at home, chronic diastolic heart failure, PAF on Coumadin, history of PE, CKD stage IIIb, non-insulin-dependent DM-2, morbid obesity-who presented to the hospital with approximately 2-3-day history of worsening shortness of breath.  For approximately 1 week-she has been having nasal congestion/URI symptoms-her PCP called in prednisone and Zithromax which she took without any relief.  In the emergency room-she was noted to have severe hypoxemia-requiring NRB-she was subsequently admitted to the hospitalist service.  Note-has not received the Covid vaccine.  Significant Events: 5/18>> admit to [facility] for severe hypoxemia requiring nonrebreather mask. 5/19>> on heated high flow 5/20>> on heated high flow+ NRB-transfer to MICU  COVID-19 medications: Steroids: 5/18>> Remdesivir: 5/18>>5/22 Actemra: 5/18 x 1  Antibiotics: None  Microbiology data: 5/18: Blood culture>> no growth  Significant studies: 5/20: Bilateral lower extremity Doppler>> negative for DVT. 5/19: Echo>> EF 60-65% 5/19: Chest x-ray>> interstitial pulmonary edema/patchy airspace opacity in the left base 5/18: Chest x-ray>> new diffuse interstitial thickening/reticular opacities suspicious for pulmonary edema  DVT prophylaxis: Coumadin-supratherapeutic INR.  Procedures: None  Consults: PCCM    Subjective:    Patient in bed, appears comfortable, denies any headache, no fever, no chest pain or pressure, she has not developed a productive cough and continues to have some shortness of breath , no abdominal pain. No focal weakness.   Assessment  & Plan :   Acute on chronic hypoxic Resp Failure due to Covid 19 Viral pneumonia and decompensated diastolic heart failure: She had severe disease and likely presented once her parenchymal damage was quite advanced due to COVID-19 pneumonia and inflammation, received appropriate care with IV steroids, remdesivir and Actemra.  She has finished her steroid course along with IV Actemra and  remdesivir. She has incurred severe parenchymal lung injury from COVID-19 pneumonia and will take several weeks before she can come back to her baseline, continue heated high flow oxygen and monitoring in ICU, diurese with as needed Lasix as needed.  She is finally feeling better and clinically gradually improving although still on heated high flow around 35 L - 40 L with 100% FiO2 with intermittent use of nonrebreather mask but there is gradual improvement.  Encouraged her to sit up in the chair in the daytime and use I-S and flutter valve for pulmonary toiletry.  She has developed a productive cough on 02/28/2020, will check sputum Gram stain culture, MRSA Gram stain culture for colonization, repeat chest x-ray, trend procalcitonin closely and monitor clinically.   O2 requirements:  SpO2: (!) 87 % O2 Flow Rate (L/min): (S) 45 L/min(15L NRB) FiO2 (%): 100 %   COVID-19 Labs: No results for input(s): DDIMER, FERRITIN, LDH, CRP in the last 72 hours.     Component Value Date/Time   BNP 101.8 (H) 02/27/2020 0604    Recent Labs  Lab 02/22/20 0656 02/23/20 0441 02/27/20 0604  PROCALCITON <0.10 <0.10 <0.10    Lab Results  Component Value Date   SARSCOV2NAA POSITIVE (A) Mar 05, 2020   SARSCOV2NAA NEGATIVE 10/06/2019   SARSCOV2NAA NOT DETECTED 04/08/2019   SARSCOV2NAA NOT  DETECTED 04/02/2019    Decompensated acute on chronic diastolic heart failure EF 60%:  Lasix as needed.  Will give a dose on 02/28/2020.  Elevated D-dimer: On chronic Coumadin, INR in good range and D-dimer now trending down, lower extremity venous duplex unremarkable.  COPD: Not in exacerbation this morning-no wheezing-decrease steroids to twice daily dosing.  Continue bronchodilators.  Normally on 4 L of oxygen at all times at home.  AKI on CKD stage IIIb: Baseline creatinine around 1.8, monitor cautiously with diuresis, [place] close to baseline.  PAF with [place] vas 2 score of at least 3 - goal is rate control, blood pressure slightly soft hence Cardizem dose dropped further on 02/27/2020, pharmacy monitoring Coumadin and INR.  Remote history of pulmonary embolism: on Coumadin pharmacy following.  Hypothyroidism: Continue Synthroid  Depression/anxiety: Slightly anxious due to acute illness/hypoxia/oxygen requirement-continue as needed Xanax and Prozac.  DM-2 (A1c 5.2 on 10/07/2019): CBGs stable, Lantus dose dropped on 02/25/2020 as steroids are now tapered off.  Will monitor closely.  Recent Labs    02/27/20 1529 02/27/20 2213 02/28/20 0752  GLUCAP 205* 159* 91   Morbid Obesity: BMI of 48 follow with PCP for weight loss.    GI prophylaxis: PPI  Condition - Extremely Guarded-very tenuous with risk for further deterioration  Family Communication  : Prefers that she update her family-she does not want me to call her mother or her son-she fears that her family will panic   Code Status :  Full Code  Diet :  Diet Order            Diet heart healthy/carb modified Room service appropriate? Yes; Fluid consistency: Thin; Fluid restriction: 1200 mL Fluid  Diet effective now               Disposition Plan  :   Status is: Inpatient  Remains inpatient appropriate because:Inpatient level of care appropriate due to severity of illness   Dispo: The patient is from: Home               Anticipated d/c is to: SNF              Anticipated  d/c date is: > 3 days              Patient currently is not medically stable to d/c.  Barriers to discharge: Severe Hypoxia requiring +++ O2 supplementation  Antimicorbials  :    Anti-infectives (From admission, onward)   Start     Dose/Rate Route Frequency Ordered Stop   02/17/20 1000  remdesivir 100 mg in sodium chloride 0.9 % 100 mL IVPB     100 mg 200 mL/hr over 30 Minutes Intravenous Daily 02-22-20 2202 02/20/20 0955   2020/02/22 2215  remdesivir 200 mg in sodium chloride 0.9% 250 mL IVPB     200 mg 580 mL/hr over 30 Minutes Intravenous Once Feb 22, 2020 2202 02/22/2020 2319      Inpatient Medications  Scheduled Meds: . vitamin C  500 mg Oral Daily  . Chlorhexidine Gluconate Cloth  6 each Topical Daily  . cholecalciferol  1,000 Units Oral Daily  . diltiazem  120 mg Oral Daily  . FLUoxetine  40 mg Oral Daily  . fluticasone  1 spray Each Nare Daily  . fluticasone furoate-vilanterol  1 puff Inhalation Daily  . furosemide  60 mg Intravenous Once  . insulin aspart  0-5 Units Subcutaneous QHS  . insulin aspart  0-9 Units Subcutaneous TID WC  . insulin detemir  8 Units Subcutaneous Daily  . iron polysaccharides  300 mg Oral Daily  . levothyroxine  175 mcg Oral Q0600  . pantoprazole  40 mg Oral Q1200  . senna-docusate  1 tablet Oral BID  . simvastatin  20 mg Oral QHS  . umeclidinium bromide  1 puff Inhalation Daily  . Warfarin - Pharmacist Dosing Inpatient   Does not apply q1600  . zinc sulfate  220 mg Oral Daily   Continuous Infusions:  PRN Meds:.acetaminophen, albuterol, ALPRAZolam, chlorpheniramine-HYDROcodone, diltiazem, docusate sodium, guaiFENesin-dextromethorphan, lip balm, oxymetazoline, simethicone, sodium chloride, traMADol   Time Spent in minutes  35    See all Orders from today for further details   Susa Raring M.D on 02/28/2020 at 8:47 AM  To page go to [email or web address] - use universal password  [facility] -  Office  [phone]    Objective:   Vitals:   02/28/20 0600 02/28/20 0700 02/28/20 0800 02/28/20 0837  BP: (!) 108/52 124/65 116/70 116/70  Pulse: 72 75 73 86  Resp: (!) 27 (!) 25 (!) 25 19  Temp:  98.6 F (37 C)    TempSrc:  Oral    SpO2: (!) 89% (!) 89% (!) 85% (!) 87%  Weight:      Height:        Wt Readings from Last 3 Encounters:  02/28/20 133.6 kg  01/28/20 132 kg  01/06/20 132 kg     Intake/Output Summary (Last 24 hours) at 02/28/2020 0847 Last data filed at 02/28/2020 0531 Gross per 24 hour  Intake 1142 ml  Output 650 ml  Net 492 ml     Physical Exam  Awake Alert, No new F.N deficits, Normal affect [place].AT,PERRAL Supple Neck,No JVD, No cervical lymphadenopathy appriciated.  Symmetrical Chest wall movement, Good air movement bilaterally,  ++ rales RRR,No Gallops, Rubs or new Murmurs, No Parasternal Heave +ve B.Sounds, Abd Soft, No tenderness, No organomegaly appriciated, No rebound - guarding or rigidity. No Cyanosis, Clubbing or edema, No new Rash or bruise    Data Review:    CBC Recent Labs  Lab 02/24/20 0848 02/25/20 0228 02/26/20 0337 02/27/20 0604 02/28/20 0805  WBC 11.6* 14.6* 12.4*  13.9* 16.4*  HGB 12.2 11.1* 10.7* 10.6* 11.0*  HCT 38.5 34.7* 33.8* 34.0* 36.3  PLT 200 206 199 168 149*  MCV 92.1 92.5 93.4 95.0 96.8  MCH 29.2 29.6 29.6 29.6 29.3  MCHC 31.7 32.0 31.7 31.2 30.3  RDW 13.7 13.6 13.7 14.2 14.6  LYMPHSABS 0.7 0.5* 0.5* 1.0 0.7  MONOABS 0.6 0.6 0.6 0.6 0.5  EOSABS 0.1 0.0 0.0 0.2 0.1  BASOSABS 0.0 0.0 0.0 0.0 0.0    Chemistries  Recent Labs  Lab 02/23/20 0441 02/24/20 0848 02/25/20 0228 02/26/20 0337 02/27/20 0604  NA 137 137 138 135 138  K 4.3 4.3 4.8 4.4 4.1  CL 101 103 103 99 102  CO2 26 25 29 27 30   GLUCOSE 104* 78 131* 190* 136*  BUN 71* 59* 57* 53* 50*  CREATININE 1.82* 1.52* 1.65* 1.70* 1.78*  CALCIUM 9.2 9.3 9.5 9.7 9.4  MG 2.4 QUANTITY NOT SUFFICIENT, UNABLE TO PERFORM TEST 2.2 2.3 2.1    AST 13* 12* 11* 12* 11*  ALT 11 8 12 12 11   ALKPHOS 68 74 68 82 67  BILITOT 0.7 QUANTITY NOT SUFFICIENT, UNABLE TO PERFORM TEST 0.4 0.4 0.7   ------------------------------------------------------------------------------------------------------------------ No results for input(s): CHOL, HDL, LDLCALC, TRIG, CHOLHDL, LDLDIRECT in the last 72 hours.  Lab Results  Component Value Date   HGBA1C 5.8 (H) 02/17/2020   ------------------------------------------------------------------------------------------------------------------ No results for input(s): TSH, T4TOTAL, T3FREE, THYROIDAB in the last 72 hours.  Invalid input(s): FREET3 ------------------------------------------------------------------------------------------------------------------ No results for input(s): VITAMINB12, FOLATE, FERRITIN, TIBC, IRON, RETICCTPCT in the last 72 hours.  Coagulation profile Recent Labs  Lab 02/24/20 0848 02/25/20 0228 02/26/20 0337 02/27/20 0604 02/28/20 0805  INR 2.3* 3.1* 3.1* 2.6* 2.2*    No results for input(s): DDIMER in the last 72 hours.  Cardiac Enzymes No results for input(s): CKMB, TROPONINI, MYOGLOBIN in the last 168 hours.  Invalid input(s): CK ------------------------------------------------------------------------------------------------------------------    Component Value Date/Time   BNP 101.8 (H) 02/27/2020 0604    Micro Results Recent Results (from the past 240 hour(s))  MRSA PCR Screening     Status: None   Collection Time: 02/24/20 10:25 AM   Specimen: Nasal Mucosa; Nasopharyngeal  Result Value Ref Range Status   MRSA by PCR NEGATIVE NEGATIVE Final    Comment:        The GeneXpert MRSA Assay (FDA approved for NASAL specimens only), is one component of a comprehensive MRSA colonization surveillance program. It is not intended to diagnose MRSA infection nor to guide or monitor treatment for MRSA infections. Performed at [facility], [address] [place], [place] [address]     Radiology Reports DG Chest [place] View  Result Date: 02/26/2020 CLINICAL DATA:  64 year old female with respiratory failure. Chronic lung disease. EXAM: PORTABLE CHEST 1 VIEW COMPARISON:  Portable chest 02/23/2020 and earlier. FINDINGS: Portable AP upright view at 0806 hours. Stable cardiomegaly and mediastinal contours. Visualized tracheal air column is within normal limits. Coarse and confluent bilateral pulmonary opacity, basilar predominant. No superimposed pneumothorax or pleural effusion. Although bilateral lung base ventilation appears mildly worsened since 02/20/2020. No acute osseous abnormality identified. IMPRESSION: Chronic lung disease and cardiomegaly with basilar predominant abnormal pulmonary opacity most suggestive of pneumonia. Mild progression since 02/20/2020. No pleural effusion identified. Electronically Signed   By: Odessa FlemingH  Hall M.D.   On: 02/26/2020 08:36   DG Chest Port 1 View  Result Date: 02/23/2020 CLINICAL DATA:  COVID pneumonia. EXAM: PORTABLE CHEST 1 VIEW COMPARISON:  One-view chest x-ray 02/20/2020 FINDINGS: Heart is enlarged. Atherosclerotic calcifications are present at the aortic arch. Diffuse interstitial and airspace disease is present. Consolidation at the left base is slightly less dense. A diffuse interstitial pattern has increased some. IMPRESSION: 1. Diffuse interstitial and airspace disease compatible with pneumonia. An element of edema is not excluded. 2. Consolidation at the left base is slightly less dense. Electronically Signed   By: Marin Roberts M.D.   On: 02/23/2020 08:49   DG Chest Port 1 View  Result Date: 02/20/2020 CLINICAL DATA:  Shortness of breath, history of COVID-19 positivity EXAM: PORTABLE CHEST 1 VIEW COMPARISON:  02/17/2020 FINDINGS: Cardiac shadow is stable but enlarged. Aortic calcifications are seen. Patchy opacities are noted primarily in the left base consistent with the given clinical history. Mild  vascular congestion is noted centrally. No other focal abnormality is seen. IMPRESSION: Mild vascular congestion. Persistent opacity in the left base consistent with the given clinical history. Electronically Signed   By: Alcide Clever M.D.   On: 02/20/2020 08:29   DG Chest Port 1 View  Result Date: 02/03/2020 CLINICAL DATA:  Dyspnea, fever. EXAM: PORTABLE CHEST 1 VIEW COMPARISON:  Radiograph 10/06/2019 FINDINGS: Significant interval change from prior exam with diffuse interstitial thickening and reticular opacities. There is also perivascular haziness. Cardiomegaly with unchanged mediastinal contours. Aortic atherosclerosis. Possible small pleural effusions. No visualized pneumothorax. IMPRESSION: New diffuse interstitial thickening and reticular opacities suspicious for pulmonary edema. Similar cardiomegaly. Possible small pleural effusions. Findings suspicious for CHF. Electronically Signed   By: Narda Rutherford M.D.   On: 02/15/2020 18:11   DG Chest Port 1V same Day  Result Date: 02/17/2020 CLINICAL DATA:  Shortness of breath EXAM: PORTABLE CHEST 1 VIEW COMPARISON:  Feb 16, 2020 FINDINGS: There is cardiomegaly with pulmonary venous hypertension. There is interstitial edema with patchy airspace opacity in the left base. No adenopathy. There is aortic atherosclerosis. No bone lesions. IMPRESSION: Cardiomegaly with pulmonary vascular congestion. There is interstitial pulmonary edema. Suspect a degree of congestive heart failure. Patchy airspace opacity in the left base is concerning for superimposed pneumonia, although alveolar edema in this area could present in this manner. Both pneumonia and alveolar edema could present concurrently. In comparison with 1 day prior, there appears to be somewhat less interstitial edema. Aortic Atherosclerosis (ICD10-I70.0). Electronically Signed   By: Bretta Bang III M.D.   On: 02/17/2020 08:01   ECHOCARDIOGRAM COMPLETE  Result Date: 02/17/2020    ECHOCARDIOGRAM  REPORT   Patient Name:   SORIYAH OSBERG Date of Exam: 02/17/2020 Medical Rec #:  [id number]      Height:       65.5 in Accession #:    [id number]     Weight:       290.3 lb Date of Birth:  1956/04/28      BSA:          2.331 m Patient Age:    63 years       BP:           120/67 mmHg Patient Gender: F              HR:           75 bpm. Exam Location:  Inpatient Procedure: 2D Echo Indications:    CHF 428  History:        Patient has prior history of Echocardiogram examinations, most                 recent 01/09/2019.  CHF, COPD, Arrythmias:Atrial Fibrillation;                 Risk Factors:Diabetes and Former Smoker. Covid +. cor pulmonale.                 pulmonary embolism.  Sonographer:    Celene Skeen RDCS (AE) Referring Phys: [id number] John Giovanni  Sonographer Comments: Patient is morbidly obese. Image acquisition challenging due to patient body habitus and Image acquisition challenging due to respiratory motion. limited mobility IMPRESSIONS  1. Left ventricular ejection fraction, by estimation, is 60 to 65%. The left ventricle has normal function. The left ventricle has no regional wall motion abnormalities. Left ventricular diastolic parameters are indeterminate.  2. Right ventricular systolic function was not well visualized. The right ventricular size is not well visualized.  3. Left atrial size was moderately dilated.  4. Right atrial size was mild to moderately dilated.  5. The mitral valve is normal in structure. No evidence of mitral valve regurgitation. No evidence of mitral stenosis.  6. The aortic valve was not well visualized. Aortic valve regurgitation is trivial. Mild to moderate aortic valve sclerosis/calcification is present, without any evidence of aortic stenosis.  7. The inferior vena cava not well seen. FINDINGS  Left Ventricle: Left ventricular ejection fraction, by estimation, is 60 to 65%. The left ventricle has normal function. The left ventricle has no regional wall motion abnormalities.  The left ventricular internal cavity size was normal in size. There is  no left ventricular hypertrophy. Left ventricular diastolic parameters are indeterminate. Right Ventricle: The right ventricular size is not well visualized. Right vetricular wall thickness was not assessed. Right ventricular systolic function was not well visualized. Left Atrium: Left atrial size was moderately dilated. Right Atrium: Right atrial size was mild to moderately dilated. Pericardium: There is no evidence of pericardial effusion. Mitral Valve: The mitral valve is normal in structure. There is mild thickening of the mitral valve leaflet(s). There is mild calcification of the mitral valve leaflet(s). Normal mobility of the mitral valve leaflets. Moderate mitral annular calcification. No evidence of mitral valve regurgitation. No evidence of mitral valve stenosis. Tricuspid Valve: The tricuspid valve is normal in structure. Tricuspid valve regurgitation is mild . No evidence of tricuspid stenosis. Aortic Valve: The aortic valve was not well visualized. Aortic valve regurgitation is trivial. Mild to moderate aortic valve sclerosis/calcification is present, without any evidence of aortic stenosis. Pulmonic Valve: The pulmonic valve was normal in structure. Pulmonic valve regurgitation is not visualized. No evidence of pulmonic stenosis. Aorta: The aortic root is normal in size and structure. Venous: The inferior vena cava not well seen. IAS/Shunts: The interatrial septum was not well visualized.  LEFT VENTRICLE PLAX 2D LVIDd:         5.30 cm LVIDs:         3.40 cm LV PW:         1.10 cm LV IVS:        1.00 cm LVOT diam:     1.80 cm LV SV:         38 LV SV Index:   16 LVOT Area:     2.54 cm  RIGHT VENTRICLE RV S prime:     14.30 cm/s TAPSE (M-mode): 1.9 cm LEFT ATRIUM           Index       RIGHT ATRIUM           Index LA diam:      4.80  cm 2.06 cm/m  RA Area:     17.70 cm LA Vol (A4C): 44.0 ml 18.88 ml/m RA Volume:   45.20 ml  19.39  ml/m  AORTIC VALVE LVOT Vmax:   83.90 cm/s LVOT Vmean:  59.500 cm/s LVOT VTI:    0.150 m  AORTA Ao Root diam: 3.40 cm MITRAL VALVE MV Area (PHT): 3.48 cm    SHUNTS MV Decel Time: 218 msec    Systemic VTI:  0.15 m MV E velocity: 73.70 cm/s  Systemic Diam: 1.80 cm Charlton Haws MD Electronically signed by Charlton Haws MD Signature Date/Time: 02/17/2020/3:39:01 PM    Final    VAS [place] LOWER EXTREMITY VENOUS (DVT)  Result Date: 02/18/2020  Lower Venous DVTStudy Indications: Swelling, and Edema.  Comparison Study: no prior Performing Technologist: Blanch Media RVS  Examination Guidelines: A complete evaluation includes B-mode imaging, spectral Doppler, color Doppler, and power Doppler as needed of all accessible portions of each vessel. Bilateral testing is considered an integral part of a complete examination. Limited examinations for reoccurring indications may be performed as noted. The reflux portion of the exam is performed with the patient in reverse Trendelenburg.  +---------+---------------+---------+-----------+----------+--------------+ RIGHT    CompressibilityPhasicitySpontaneityPropertiesThrombus Aging +---------+---------------+---------+-----------+----------+--------------+ CFV      Full           Yes      Yes                                 +---------+---------------+---------+-----------+----------+--------------+ SFJ      Full                                                        +---------+---------------+---------+-----------+----------+--------------+ FV Prox  Full                                                        +---------+---------------+---------+-----------+----------+--------------+ FV Mid   Full                                                        +---------+---------------+---------+-----------+----------+--------------+ FV DistalFull                                                         +---------+---------------+---------+-----------+----------+--------------+ PFV      Full                                                        +---------+---------------+---------+-----------+----------+--------------+ POP      Full           Yes      Yes                                 +---------+---------------+---------+-----------+----------+--------------+  PTV      Full                                                        +---------+---------------+---------+-----------+----------+--------------+ PERO     Full                                                        +---------+---------------+---------+-----------+----------+--------------+   +---------+---------------+---------+-----------+----------+--------------+ LEFT     CompressibilityPhasicitySpontaneityPropertiesThrombus Aging +---------+---------------+---------+-----------+----------+--------------+ CFV      Full           Yes      Yes                                 +---------+---------------+---------+-----------+----------+--------------+ SFJ      Full                                                        +---------+---------------+---------+-----------+----------+--------------+ FV Prox  Full                                                        +---------+---------------+---------+-----------+----------+--------------+ FV Mid   Full                                                        +---------+---------------+---------+-----------+----------+--------------+ FV DistalFull                                                        +---------+---------------+---------+-----------+----------+--------------+ PFV      Full                                                        +---------+---------------+---------+-----------+----------+--------------+ POP      Full           Yes      Yes                                  +---------+---------------+---------+-----------+----------+--------------+ PTV      Full                                                        +---------+---------------+---------+-----------+----------+--------------+  PERO     Full                                                        +---------+---------------+---------+-----------+----------+--------------+     Summary: BILATERAL: - No evidence of deep vein thrombosis seen in the lower extremities, bilaterally. -   *See table(s) above for measurements and observations. Electronically signed by Monica Martinez MD on 02/18/2020 at 5:31:51 PM.    Final

## 2020-02-29 LAB — COMPREHENSIVE METABOLIC PANEL
ALT: 11 U/L (ref 0–44)
AST: 13 U/L — ABNORMAL LOW (ref 15–41)
Albumin: 2.5 g/dL — ABNORMAL LOW (ref 3.5–5.0)
Alkaline Phosphatase: 90 U/L (ref 38–126)
Anion gap: 7 (ref 5–15)
BUN: 47 mg/dL — ABNORMAL HIGH (ref 8–23)
CO2: 28 mmol/L (ref 22–32)
Calcium: 9.7 mg/dL (ref 8.9–10.3)
Chloride: 102 mmol/L (ref 98–111)
Creatinine, Ser: 1.72 mg/dL — ABNORMAL HIGH (ref 0.44–1.00)
GFR calc Af Amer: 36 mL/min — ABNORMAL LOW (ref >60)
GFR calc non Af Amer: 31 mL/min — ABNORMAL LOW (ref >60)
Glucose, Bld: 118 mg/dL — ABNORMAL HIGH (ref 70–99)
Potassium: 4.2 mmol/L (ref 3.5–5.1)
Sodium: 137 mmol/L (ref 135–145)
Total Bilirubin: 0.2 mg/dL — ABNORMAL LOW (ref 0.3–1.2)
Total Protein: 5.1 g/dL — ABNORMAL LOW (ref 6.5–8.1)

## 2020-02-29 LAB — CBC WITH DIFFERENTIAL/PLATELET
Abs Immature Granulocytes: 0.07 10*3/uL (ref 0.00–0.07)
Basophils Absolute: 0 10*3/uL (ref 0.0–0.1)
Basophils Relative: 0 %
Eosinophils Absolute: 0.2 10*3/uL (ref 0.0–0.5)
Eosinophils Relative: 1 %
HCT: 34.3 % — ABNORMAL LOW (ref 36.0–46.0)
Hemoglobin: 10.4 g/dL — ABNORMAL LOW (ref 12.0–15.0)
Immature Granulocytes: 0 %
Lymphocytes Relative: 3 %
Lymphs Abs: 0.6 10*3/uL — ABNORMAL LOW (ref 0.7–4.0)
MCH: 29.6 pg (ref 26.0–34.0)
MCHC: 30.3 g/dL (ref 30.0–36.0)
MCV: 97.7 fL (ref 80.0–100.0)
Monocytes Absolute: 0.5 10*3/uL (ref 0.1–1.0)
Monocytes Relative: 3 %
Neutro Abs: 16.4 10*3/uL — ABNORMAL HIGH (ref 1.7–7.7)
Neutrophils Relative %: 93 %
Platelets: 133 10*3/uL — ABNORMAL LOW (ref 150–400)
RBC: 3.51 MIL/uL — ABNORMAL LOW (ref 3.87–5.11)
RDW: 14.6 % (ref 11.5–15.5)
WBC: 17.7 10*3/uL — ABNORMAL HIGH (ref 4.0–10.5)
nRBC: 0 % (ref 0.0–0.2)

## 2020-02-29 LAB — PROTIME-INR
INR: 2.3 — ABNORMAL HIGH (ref 0.8–1.2)
Prothrombin Time: 24.8 seconds — ABNORMAL HIGH (ref 11.4–15.2)

## 2020-02-29 LAB — MAGNESIUM: Magnesium: 2.3 mg/dL (ref 1.7–2.4)

## 2020-02-29 LAB — BRAIN NATRIURETIC PEPTIDE: B Natriuretic Peptide: 146.1 pg/mL — ABNORMAL HIGH (ref 0.0–100.0)

## 2020-02-29 LAB — GLUCOSE, CAPILLARY
Glucose-Capillary: 127 mg/dL — ABNORMAL HIGH (ref 70–99)
Glucose-Capillary: 124 mg/dL — ABNORMAL HIGH (ref 70–99)
Glucose-Capillary: 165 mg/dL — ABNORMAL HIGH (ref 70–99)
Glucose-Capillary: 136 mg/dL — ABNORMAL HIGH (ref 70–99)

## 2020-02-29 LAB — PROCALCITONIN: Procalcitonin: 0.13 ng/mL

## 2020-02-29 MED ORDER — VANCOMYCIN HCL 2000 MG/400ML IV SOLN: 2000.0000 mg | INTRAVENOUS | Status: DC

## 2020-02-29 MED ORDER — VANCOMYCIN HCL 10 G IV SOLR
2500.0000 mg | Freq: Once | INTRAVENOUS | Status: AC
  Administered 2020-02-29: 2500 mg via INTRAVENOUS
  Filled 2020-02-29: qty 2500

## 2020-02-29 MED ORDER — METOLAZONE 5 MG PO TABS
2.5000 mg | ORAL_TABLET | Freq: Once | ORAL | Status: AC
  Administered 2020-02-29: 2.5 mg via ORAL
  Filled 2020-02-29: qty 1

## 2020-02-29 MED ORDER — FUROSEMIDE 10 MG/ML IJ SOLN
60.0000 mg | Freq: Once | INTRAMUSCULAR | Status: AC
  Administered 2020-02-29: 60 mg via INTRAVENOUS
  Filled 2020-02-29: qty 6

## 2020-02-29 MED ORDER — FUROSEMIDE 10 MG/ML IJ SOLN
40.0000 mg | Freq: Once | INTRAMUSCULAR | Status: AC
  Administered 2020-02-29: 40 mg via INTRAVENOUS
  Filled 2020-02-29: qty 4

## 2020-02-29 MED ORDER — AZITHROMYCIN 500 MG PO TABS
500.0000 mg | ORAL_TABLET | Freq: Every day | ORAL | Status: DC
  Administered 2020-02-29 – 2020-03-01 (×2): 500 mg via ORAL
  Filled 2020-02-29 (×3): qty 1

## 2020-02-29 MED ORDER — SODIUM CHLORIDE 0.9 % IV SOLN
2.0000 g | Freq: Two times a day (BID) | INTRAVENOUS | Status: DC
  Administered 2020-02-29 – 2020-03-01 (×3): 2 g via INTRAVENOUS
  Filled 2020-02-29 (×4): qty 2

## 2020-02-29 MED ORDER — DEXMEDETOMIDINE HCL IN NACL 400 MCG/100ML IV SOLN
0.4000 ug/kg/h | INTRAVENOUS | Status: DC
  Administered 2020-02-29: 0.4 ug/kg/h via INTRAVENOUS
  Administered 2020-03-01 (×2): 1 ug/kg/h via INTRAVENOUS
  Administered 2020-03-01: 0.4 ug/kg/h via INTRAVENOUS
  Filled 2020-02-29: qty 100
  Filled 2020-02-29: qty 200
  Filled 2020-02-29 (×2): qty 100
  Filled 2020-02-29: qty 200

## 2020-02-29 MED ORDER — WARFARIN SODIUM 2.5 MG PO TABS
2.5000 mg | ORAL_TABLET | Freq: Once | ORAL | Status: AC
  Administered 2020-02-29: 2.5 mg via ORAL
  Filled 2020-02-29: qty 1

## 2020-02-29 NOTE — Progress Notes (Addendum)
PROGRESS NOTE                                                                                                                                                                                                             Patient Demographics:    Amy Bryant, is a 64 y.o. female, DOB - [birth year], ZOX:[id number]  Outpatient Primary MD for the patient is [name], [name], MD   Admit date - 03/16/20   LOS - 13  CC - SOB     Brief Narrative:  Patient is a 64 y.o. female with PMHx of COPD with chronic hypoxic respiratory failure on 4 L of oxygen at home, chronic diastolic heart failure, PAF on Coumadin, history of PE, CKD stage IIIb, non-insulin-dependent DM-2, morbid obesity-who presented to the hospital with approximately 2-3-day history of worsening shortness of breath.  For approximately 1 week-she has been having nasal congestion/URI symptoms-her PCP called in prednisone and Zithromax which she took without any relief.  In the emergency room-she was noted to have severe hypoxemia-requiring NRB-she was subsequently admitted to the hospitalist service.  Note-has not received the Covid vaccine.  Significant Events: 5/18>> admit to [facility] for severe hypoxemia requiring nonrebreather mask. 5/19>> on heated high flow 5/20>> on heated high flow+ NRB-transfer to MICU  COVID-19 medications: Steroids: 5/18>> Remdesivir: 5/18>>5/22 Actemra: 5/18 x 1  Antibiotics: None  Microbiology data: 5/18: Blood culture>> no growth  Significant studies: 5/20: Bilateral lower extremity Doppler>> negative for DVT. 5/19: Echo>> EF 60-65% 5/19: Chest x-ray>> interstitial pulmonary edema/patchy airspace opacity in the left base 5/18: Chest x-ray>> new diffuse interstitial thickening/reticular opacities suspicious for pulmonary edema  DVT prophylaxis: Coumadin-supratherapeutic INR.  Procedures: None  Consults: PCCM    Subjective:    Patient in bed, appears comfortable, denies any headache, no fever, no chest pain or pressure, +ve shortness of breath , no abdominal pain. No focal weakness.    Assessment  & Plan :   Acute on chronic hypoxic Resp Failure due to Covid 19 Viral pneumonia and decompensated diastolic heart failure: She had severe disease and likely presented once her parenchymal damage was quite advanced due to COVID-19 pneumonia and inflammation, received appropriate care with IV steroids, remdesivir and Actemra.  She has finished her steroid course along with IV Actemra and remdesivir. She has incurred severe parenchymal lung injury from COVID-19  pneumonia and will take several weeks before she can come back to her baseline, continue heated high flow oxygen and monitoring in ICU, diurese with as needed Lasix as needed.  She is finally feeling better and clinically gradually improving although still on heated high flow around 35 L - 40 L with 100% FiO2 with intermittent use of nonrebreather mask, however having said that she is now getting gradually tired because of the continued respiratory effort for several days to weeks, I discussed her wishes and she wants to go on a ventilator if needed however I told her that this most likely will be one-way intubation as if her condition worsens to the point that she is getting intubated due to severe COVID-19 induced parenchymal injury then chances of coming out of it are extremely slim.   Encouraged her to sit up in the chair in the daytime and use I-S and flutter valve for pulmonary toiletry.  Continues to have productive cough, sputum Gram stain noted, nasal MRSA swab negative, will add 4 days of azithromycin, continue to trend inflammatory markers including procalcitonin, margin of error is extremely low.  O2 requirements:  SpO2: (!) 80 % O2 Flow Rate (L/min): 45 L/min FiO2 (%): 100 %   COVID-19 Labs: No results for input(s): DDIMER, FERRITIN, LDH, CRP in the last  72 hours.     Component Value Date/Time   BNP 146.1 (H) 02/29/2020 0603    Recent Labs  Lab 02/23/20 0441 02/27/20 0604 02/28/20 0805  PROCALCITON <0.10 <0.10 <0.10    Lab Results  Component Value Date   SARSCOV2NAA POSITIVE (A) 02/04/2020   SARSCOV2NAA NEGATIVE 10/06/2019   SARSCOV2NAA NOT DETECTED 04/08/2019   SARSCOV2NAA NOT DETECTED 04/02/2019    Decompensated acute on chronic diastolic heart failure EF 60%:  Lasix as needed.  Will give a dose of high-dose IV Lasix along with oral Zaroxolyn on 02/29/2020.  Elevated D-dimer: On chronic Coumadin, INR in good range and D-dimer now trending down, lower extremity venous duplex unremarkable.  COPD: Not in exacerbation this morning-no wheezing-decrease steroids to twice daily dosing.  Continue bronchodilators.  Normally on 4 L of oxygen at all times at home.  AKI on CKD stage IIIb: Baseline creatinine around 1.8, monitor cautiously with diuresis, [place] close to baseline.  PAF with [place] vas 2 score of at least 3 - goal is rate control, blood pressure slightly soft hence Cardizem dose dropped further on 02/27/2020, pharmacy monitoring Coumadin and INR.  Remote history of pulmonary embolism: on Coumadin pharmacy following.  Hypothyroidism: Continue Synthroid  Depression/anxiety: Slightly anxious due to acute illness/hypoxia/oxygen requirement-continue as needed Xanax and Prozac.  DM-2 (A1c 5.2 on 10/07/2019): CBGs stable, Lantus dose dropped on 02/25/2020 as steroids are now tapered off.  Will monitor closely.  Recent Labs    02/28/20 1702 02/28/20 2205 02/29/20 0754  GLUCAP 156* 169* 127*   Morbid Obesity: BMI of 48 follow with PCP for weight loss.    GI prophylaxis: PPI  Condition - Extremely Guarded-very tenuous with risk for further deterioration  Family Communication  : Prefers that she update her family-she does not want me to call her mother or her son-she fears that her family will panic, however mother updated  02/29/20   Code Status :  Full Code  Diet :  Diet Order            Diet heart healthy/carb modified Room service appropriate? Yes; Fluid consistency: Thin; Fluid restriction: 1200 mL Fluid  Diet effective now  Disposition Plan  :   Status is: Inpatient  Remains inpatient appropriate because:Inpatient level of care appropriate due to severity of illness   Dispo: The patient is from: Home              Anticipated d/c is to: SNF              Anticipated d/c date is: > 3 days              Patient currently is not medically stable to d/c.  Barriers to discharge: Severe Hypoxia requiring +++ O2 supplementation  Antimicorbials  :    Anti-infectives (From admission, onward)   Start     Dose/Rate Route Frequency Ordered Stop   02/29/20 0715  azithromycin (ZITHROMAX) tablet 500 mg     500 mg Oral Daily 02/29/20 0713 03/04/20 0959   02/17/20 1000  remdesivir 100 mg in sodium chloride 0.9 % 100 mL IVPB     100 mg 200 mL/hr over 30 Minutes Intravenous Daily 03/17/20 2202 02/20/20 0955   03/17/2020 2215  remdesivir 200 mg in sodium chloride 0.9% 250 mL IVPB     200 mg 580 mL/hr over 30 Minutes Intravenous Once 03-17-2020 2202 March 17, 2020 2319      Inpatient Medications  Scheduled Meds: . vitamin C  500 mg Oral Daily  . azithromycin  500 mg Oral Daily  . Chlorhexidine Gluconate Cloth  6 each Topical Daily  . cholecalciferol  1,000 Units Oral Daily  . diltiazem  120 mg Oral Daily  . FLUoxetine  40 mg Oral Daily  . fluticasone  1 spray Each Nare Daily  . fluticasone furoate-vilanterol  1 puff Inhalation Daily  . insulin aspart  0-5 Units Subcutaneous QHS  . insulin aspart  0-9 Units Subcutaneous TID WC  . insulin detemir  8 Units Subcutaneous Daily  . iron polysaccharides  300 mg Oral Daily  . levothyroxine  175 mcg Oral Q0600  . pantoprazole  40 mg Oral Q1200  . senna-docusate  1 tablet Oral BID  . simvastatin  20 mg Oral QHS  . umeclidinium bromide  1 puff  Inhalation Daily  . warfarin  2.5 mg Oral ONCE-1600  . Warfarin - Pharmacist Dosing Inpatient   Does not apply q1600  . zinc sulfate  220 mg Oral Daily   Continuous Infusions:  PRN Meds:.acetaminophen, albuterol, ALPRAZolam, chlorpheniramine-HYDROcodone, diltiazem, docusate sodium, guaiFENesin-dextromethorphan, lip balm, oxymetazoline, simethicone, sodium chloride, traMADol   Time Spent in minutes  35    See all Orders from today for further details   Susa Raring M.D on 02/29/2020 at 9:55 AM  To page go to [email or web address] - use universal password  [facility] -  Office  [phone]    Objective:   Vitals:   02/29/20 0700 02/29/20 0739 02/29/20 0800 02/29/20 0914  BP: 125/62  (!) 96/51 (!) 106/58  Pulse: 69  74   Resp: 16  17   Temp:      TempSrc:      SpO2: (!) 88% (!) 81% (!) 80%   Weight:      Height:        Wt Readings from Last 3 Encounters:  02/29/20 (!) 139.6 kg  01/28/20 132 kg  01/06/20 132 kg     Intake/Output Summary (Last 24 hours) at 02/29/2020 0955 Last data filed at 02/29/2020 0900 Gross per 24 hour  Intake --  Output 1600 ml  Net -1600 ml     Physical Exam  Awake Alert, No  new F.N deficits, Normal affect [place].AT,PERRAL Supple Neck,No JVD, No cervical lymphadenopathy appriciated.  Symmetrical Chest wall movement, Good air movement bilaterally, +ve rales RRR,No Gallops, Rubs or new Murmurs, No Parasternal Heave +ve B.Sounds, Abd Soft, No tenderness, No organomegaly appriciated, No rebound - guarding or rigidity. No Cyanosis, Clubbing or edema, No new Rash or bruise    Data Review:    CBC Recent Labs  Lab 02/25/20 0228 02/26/20 0337 02/27/20 0604 02/28/20 0805 02/29/20 0603  WBC 14.6* 12.4* 13.9* 16.4* 17.7*  HGB 11.1* 10.7* 10.6* 11.0* 10.4*  HCT 34.7* 33.8* 34.0* 36.3 34.3*  PLT 206 199 168 149* 133*  MCV 92.5 93.4 95.0 96.8 97.7  MCH 29.6 29.6 29.6 29.3 29.6  MCHC 32.0 31.7 31.2 30.3 30.3  RDW 13.6 13.7 14.2 14.6 14.6   LYMPHSABS 0.5* 0.5* 1.0 0.7 0.6*  MONOABS 0.6 0.6 0.6 0.5 0.5  EOSABS 0.0 0.0 0.2 0.1 0.2  BASOSABS 0.0 0.0 0.0 0.0 0.0    Chemistries  Recent Labs  Lab 02/25/20 0228 02/26/20 0337 02/27/20 0604 02/28/20 0805 02/29/20 0603  NA 138 135 138 136 137  K 4.8 4.4 4.1 3.9 4.2  CL 103 99 102 101 102  CO2 GLUCOSE 131* 190* 136* 98 118*  BUN 57* 53* 50* 51* 47*  CREATININE 1.65* 1.70* 1.78* 1.59* 1.72*  CALCIUM 9.5 9.7 9.4 9.5 9.7  MG 2.2 2.3 2.1 2.4 2.3  AST 11* 12* 11* 12* 13*  ALT ALKPHOS 68 82 67 81 90  BILITOT 0.4 0.4 0.7 0.5 0.2*   ------------------------------------------------------------------------------------------------------------------ No results for input(s): CHOL, HDL, LDLCALC, TRIG, CHOLHDL, LDLDIRECT in the last 72 hours.  Lab Results  Component Value Date   HGBA1C 5.8 (H) 02/17/2020   ------------------------------------------------------------------------------------------------------------------ No results for input(s): TSH, T4TOTAL, T3FREE, THYROIDAB in the last 72 hours.  Invalid input(s): FREET3 ------------------------------------------------------------------------------------------------------------------ No results for input(s): VITAMINB12, FOLATE, FERRITIN, TIBC, IRON, RETICCTPCT in the last 72 hours.  Coagulation profile Recent Labs  Lab 02/25/20 0228 02/26/20 0337 02/27/20 0604 02/28/20 0805 02/29/20 0603  INR 3.1* 3.1* 2.6* 2.2* 2.3*    No results for input(s): DDIMER in the last 72 hours.  Cardiac Enzymes No results for input(s): CKMB, TROPONINI, MYOGLOBIN in the last 168 hours.  Invalid input(s): CK ------------------------------------------------------------------------------------------------------------------    Component Value Date/Time   BNP 146.1 (H) 02/29/2020 0603    Micro Results Recent Results (from the past 240 hour(s))  MRSA PCR Screening     Status: None   Collection Time:  02/24/20 10:25 AM   Specimen: Nasal Mucosa; Nasopharyngeal  Result Value Ref Range Status   MRSA by PCR NEGATIVE NEGATIVE Final    Comment:        The GeneXpert MRSA Assay (FDA approved for NASAL specimens only), is one component of a comprehensive MRSA colonization surveillance program. It is not intended to diagnose MRSA infection nor to guide or monitor treatment for MRSA infections. Performed at [facility], [address] [place], [place] [address]   Expectorated sputum assessment w rflx to resp cult     Status: None   Collection Time: 02/28/20 10:18 AM   Specimen: Expectorated Sputum  Result Value Ref Range Status   Specimen Description Expect. Sput  Final   Special Requests NONE  Final   Sputum evaluation   Final    THIS SPECIMEN IS ACCEPTABLE FOR SPUTUM CULTURE Performed at [facility], [address],  [place], [place] [address]    Report Status 02/28/2020 FINAL  Final  MRSA PCR Screening     Status: None   Collection Time: 02/28/20 10:18 AM   Specimen: Nasopharyngeal  Result Value Ref Range Status   MRSA by PCR NEGATIVE NEGATIVE Final    Comment:        The GeneXpert MRSA Assay (FDA approved for NASAL specimens only), is one component of a comprehensive MRSA colonization surveillance program. It is not intended to diagnose MRSA infection nor to guide or monitor treatment for MRSA infections. Performed at [facility], [address] [place], [place] [address]   Culture, respiratory     Status: None (Preliminary result)   Collection Time: 02/28/20 10:18 AM  Result Value Ref Range Status   Specimen Description Expect. Sput  Final   Special Requests NONE Reflexed from [id number]  Final   Gram Stain   Final    RARE WBC PRESENT,BOTH PMN AND MONONUCLEAR FEW GRAM POSITIVE COCCI IN PAIRS    Culture   Final    CULTURE REINCUBATED FOR BETTER GROWTH Performed at [facility], [address], [place], [place] [address]    Report Status  PENDING  Incomplete    Radiology Reports DG Chest Port 1 View  Result Date: 02/28/2020 CLINICAL DATA:  Shortness of breath.  COVID-19 positive EXAM: PORTABLE CHEST 1 VIEW COMPARISON:  Feb 26, 2020 FINDINGS: There is apparent underlying fibrosis. There is airspace consolidation throughout the right mid and lower lung zones, a change from 2 days prior. There is a small pleural effusion on each side, stable. There is ill-defined opacity in the lateral left base which has increased from 2 days prior. There is cardiomegaly with a degree of pulmonary venous hypertension. No adenopathy evident. There is aortic atherosclerosis. No bone lesions. IMPRESSION: Multifocal airspace opacity, particularly on the right, a new finding likely representing multifocal pneumonia. A degree of alveolar edema superimposed is possible. Underlying fibrotic change. A degree of interstitial edema superimposed is question. The interstitium appear stable compared to the previous study. Note that there may be a degree of congestive heart failure. There are small pleural effusions bilaterally. Stable cardiomegaly with pulmonary vascular congestion. Aortic Atherosclerosis (ICD10-I70.0). Electronically Signed   By: Bretta Bang III M.D.   On: 02/28/2020 09:20   DG Chest Port 1 View  Result Date: 02/26/2020 CLINICAL DATA:  64 year old female with respiratory failure. Chronic lung disease. EXAM: PORTABLE CHEST 1 VIEW COMPARISON:  Portable chest 02/23/2020 and earlier. FINDINGS: Portable AP upright view at 0806 hours. Stable cardiomegaly and mediastinal contours. Visualized tracheal air column is within normal limits. Coarse and confluent bilateral pulmonary opacity, basilar predominant. No superimposed pneumothorax or pleural effusion. Although bilateral lung base ventilation appears mildly worsened since 02/20/2020. No acute osseous abnormality identified. IMPRESSION: Chronic lung disease and cardiomegaly with basilar predominant  abnormal pulmonary opacity most suggestive of pneumonia. Mild progression since 02/20/2020. No pleural effusion identified. Electronically Signed   By: Odessa Fleming M.D.   On: 02/26/2020 08:36   DG Chest Port 1 View  Result Date: 02/23/2020 CLINICAL DATA:  COVID pneumonia. EXAM: PORTABLE CHEST 1 VIEW COMPARISON:  One-view chest x-ray 02/20/2020 FINDINGS: Heart is enlarged. Atherosclerotic calcifications are present at the aortic arch. Diffuse interstitial and airspace disease is present. Consolidation at the left base is slightly less dense. A diffuse interstitial pattern has increased some. IMPRESSION: 1. Diffuse interstitial and airspace disease compatible with pneumonia. An element of edema is not excluded. 2.  Consolidation at the left base is slightly less dense. Electronically Signed   By: San Morelle M.D.   On: 02/23/2020 08:49   DG Chest Port 1 View  Result Date: 02/20/2020 CLINICAL DATA:  Shortness of breath, history of COVID-19 positivity EXAM: PORTABLE CHEST 1 VIEW COMPARISON:  02/17/2020 FINDINGS: Cardiac shadow is stable but enlarged. Aortic calcifications are seen. Patchy opacities are noted primarily in the left base consistent with the given clinical history. Mild vascular congestion is noted centrally. No other focal abnormality is seen. IMPRESSION: Mild vascular congestion. Persistent opacity in the left base consistent with the given clinical history. Electronically Signed   By: Inez Catalina M.D.   On: 02/20/2020 08:29   DG Chest Port 1 View  Result Date: Feb 26, 2020 CLINICAL DATA:  Dyspnea, fever. EXAM: PORTABLE CHEST 1 VIEW COMPARISON:  Radiograph 10/06/2019 FINDINGS: Significant interval change from prior exam with diffuse interstitial thickening and reticular opacities. There is also perivascular haziness. Cardiomegaly with unchanged mediastinal contours. Aortic atherosclerosis. Possible small pleural effusions. No visualized pneumothorax. IMPRESSION: New diffuse interstitial  thickening and reticular opacities suspicious for pulmonary edema. Similar cardiomegaly. Possible small pleural effusions. Findings suspicious for CHF. Electronically Signed   By: Keith Rake M.D.   On: 26-Feb-2020 18:11   DG Chest Port 1V same Day  Result Date: 02/17/2020 CLINICAL DATA:  Shortness of breath EXAM: PORTABLE CHEST 1 VIEW COMPARISON:  Feb 26, 2020 FINDINGS: There is cardiomegaly with pulmonary venous hypertension. There is interstitial edema with patchy airspace opacity in the left base. No adenopathy. There is aortic atherosclerosis. No bone lesions. IMPRESSION: Cardiomegaly with pulmonary vascular congestion. There is interstitial pulmonary edema. Suspect a degree of congestive heart failure. Patchy airspace opacity in the left base is concerning for superimposed pneumonia, although alveolar edema in this area could present in this manner. Both pneumonia and alveolar edema could present concurrently. In comparison with 1 day prior, there appears to be somewhat less interstitial edema. Aortic Atherosclerosis (ICD10-I70.0). Electronically Signed   By: Lowella Grip III M.D.   On: 02/17/2020 08:01   ECHOCARDIOGRAM COMPLETE  Result Date: 02/17/2020    ECHOCARDIOGRAM REPORT   Patient Name:   CELESTER MORGAN Date of Exam: 02/17/2020 Medical Rec #:  [id number]      Height:       65.5 in Accession #:    [id number]     Weight:       290.3 lb Date of Birth:  [birth year]      BSA:          2.331 m Patient Age:    6 years       BP:           120/67 mmHg Patient Gender: F              HR:           75 bpm. Exam Location:  Inpatient Procedure: 2D Echo Indications:    CHF 428  History:        Patient has prior history of Echocardiogram examinations, most                 recent 01/09/2019. CHF, COPD, Arrythmias:Atrial Fibrillation;                 Risk Factors:Diabetes and Former Smoker. Covid +. cor pulmonale.                 pulmonary embolism.  Sonographer:    Jannett Celestine RDCS (AE) Referring Phys:  [id number] VASUNDHRA RATHORE  Sonographer Comments: Patient is morbidly obese. Image acquisition challenging due to patient body habitus and Image acquisition challenging due to respiratory motion. limited mobility IMPRESSIONS  1. Left ventricular ejection fraction, by estimation, is 60 to 65%. The left ventricle has normal function. The left ventricle has no regional wall motion abnormalities. Left ventricular diastolic parameters are indeterminate.  2. Right ventricular systolic function was not well visualized. The right ventricular size is not well visualized.  3. Left atrial size was moderately dilated.  4. Right atrial size was mild to moderately dilated.  5. The mitral valve is normal in structure. No evidence of mitral valve regurgitation. No evidence of mitral stenosis.  6. The aortic valve was not well visualized. Aortic valve regurgitation is trivial. Mild to moderate aortic valve sclerosis/calcification is present, without any evidence of aortic stenosis.  7. The inferior vena cava not well seen. FINDINGS  Left Ventricle: Left ventricular ejection fraction, by estimation, is 60 to 65%. The left ventricle has normal function. The left ventricle has no regional wall motion abnormalities. The left ventricular internal cavity size was normal in size. There is  no left ventricular hypertrophy. Left ventricular diastolic parameters are indeterminate. Right Ventricle: The right ventricular size is not well visualized. Right vetricular wall thickness was not assessed. Right ventricular systolic function was not well visualized. Left Atrium: Left atrial size was moderately dilated. Right Atrium: Right atrial size was mild to moderately dilated. Pericardium: There is no evidence of pericardial effusion. Mitral Valve: The mitral valve is normal in structure. There is mild thickening of the mitral valve leaflet(s). There is mild calcification of the mitral valve leaflet(s). Normal mobility of the mitral valve leaflets.  Moderate mitral annular calcification. No evidence of mitral valve regurgitation. No evidence of mitral valve stenosis. Tricuspid Valve: The tricuspid valve is normal in structure. Tricuspid valve regurgitation is mild . No evidence of tricuspid stenosis. Aortic Valve: The aortic valve was not well visualized. Aortic valve regurgitation is trivial. Mild to moderate aortic valve sclerosis/calcification is present, without any evidence of aortic stenosis. Pulmonic Valve: The pulmonic valve was normal in structure. Pulmonic valve regurgitation is not visualized. No evidence of pulmonic stenosis. Aorta: The aortic root is normal in size and structure. Venous: The inferior vena cava not well seen. IAS/Shunts: The interatrial septum was not well visualized.  LEFT VENTRICLE PLAX 2D LVIDd:         5.30 cm LVIDs:         3.40 cm LV PW:         1.10 cm LV IVS:        1.00 cm LVOT diam:     1.80 cm LV SV:         38 LV SV Index:   16 LVOT Area:     2.54 cm  RIGHT VENTRICLE RV S prime:     14.30 cm/s TAPSE (M-mode): 1.9 cm LEFT ATRIUM           Index       RIGHT ATRIUM           Index LA diam:      4.80 cm 2.06 cm/m  RA Area:     17.70 cm LA Vol (A4C): 44.0 ml 18.88 ml/m RA Volume:   45.20 ml  19.39 ml/m  AORTIC VALVE LVOT Vmax:   83.90 cm/s LVOT Vmean:  59.500 cm/s LVOT VTI:    0.150 m  AORTA Ao Root diam: 3.40 cm MITRAL VALVE  MV Area (PHT): 3.48 cm    SHUNTS MV Decel Time: 218 msec    Systemic VTI:  0.15 m MV E velocity: 73.70 cm/s  Systemic Diam: 1.80 cm Charlton HawsPeter Nishan MD Electronically signed by Charlton HawsPeter Nishan MD Signature Date/Time: 02/17/2020/3:39:01 PM    Final    VAS US LOWER EXTREMITY VENOUS (DVT)  Result Date: 02/18/2020  Lower Venous DVTStudy Indications: Swelling, and Edema.  Comparison Study: no prior Performing Technologist: Blanch MediaMegan Riddle RVS  Examination Guidelines: A complete evaluation includes B-mode imaging, spectral Doppler, color Doppler, and power Doppler as needed of all accessible portions of each  vessel. Bilateral testing is considered an integral part of a complete examination. Limited examinations for reoccurring indications may be performed as noted. The reflux portion of the exam is performed with the patient in reverse Trendelenburg.  +---------+---------------+---------+-----------+----------+--------------+ RIGHT    CompressibilityPhasicitySpontaneityPropertiesThrombus Aging +---------+---------------+---------+-----------+----------+--------------+ CFV      Full           Yes      Yes                                 +---------+---------------+---------+-----------+----------+--------------+ SFJ      Full                                                        +---------+---------------+---------+-----------+----------+--------------+ FV Prox  Full                                                        +---------+---------------+---------+-----------+----------+--------------+ FV Mid   Full                                                        +---------+---------------+---------+-----------+----------+--------------+ FV DistalFull                                                        +---------+---------------+---------+-----------+----------+--------------+ PFV      Full                                                        +---------+---------------+---------+-----------+----------+--------------+ POP      Full           Yes      Yes                                 +---------+---------------+---------+-----------+----------+--------------+ PTV      Full                                                        +---------+---------------+---------+-----------+----------+--------------+  PERO     Full                                                        +---------+---------------+---------+-----------+----------+--------------+   +---------+---------------+---------+-----------+----------+--------------+ LEFT      CompressibilityPhasicitySpontaneityPropertiesThrombus Aging +---------+---------------+---------+-----------+----------+--------------+ CFV      Full           Yes      Yes                                 +---------+---------------+---------+-----------+----------+--------------+ SFJ      Full                                                        +---------+---------------+---------+-----------+----------+--------------+ FV Prox  Full                                                        +---------+---------------+---------+-----------+----------+--------------+ FV Mid   Full                                                        +---------+---------------+---------+-----------+----------+--------------+ FV DistalFull                                                        +---------+---------------+---------+-----------+----------+--------------+ PFV      Full                                                        +---------+---------------+---------+-----------+----------+--------------+ POP      Full           Yes      Yes                                 +---------+---------------+---------+-----------+----------+--------------+ PTV      Full                                                        +---------+---------------+---------+-----------+----------+--------------+ PERO     Full                                                        +---------+---------------+---------+-----------+----------+--------------+       Summary: BILATERAL: - No evidence of deep vein thrombosis seen in the lower extremities, bilaterally. -   *See table(s) above for measurements and observations. Electronically signed by Sherald Hess MD on 02/18/2020 at 5:31:51 PM.    Final

## 2020-02-29 NOTE — Progress Notes (Signed)
OT Cancellation Note  Patient Details Name: Amy Bryant MRN: [id number] DOB: [birth year]   Cancelled Treatment:    Reason Eval/Treat Not Completed: Medical issues which prohibited therapy. Per RN, pt with difficulty maintaining SpO2 >85% on 45L on HFNC and NRB. RN reporting pt was just placed with CPAP. Will hold at this time and return as schedule allows.  Debar Plate M Kemberly Taves [name] MSOT, OTR/L Acute Rehab Pager: [phone] Office: [phone] 02/29/2020, 3:55 PM

## 2020-02-29 NOTE — Progress Notes (Addendum)
NAME:  Amy Bryant, MRN:  [id number], DOB:  08-Feb-1956, LOS: 13 ADMISSION DATE:  02/19/2020, CONSULTATION DATE: 02/18/2020 REFERRING MD:  Jeoffrey Massed, CHIEF COMPLAINT: COVID-19 pneumonia  Brief History   64 year old with past medical history including COPD, CKD, chronic respiratory failure presenting with COVID-19 pneumonia on 5/18. Treated with steroids, remdesivir and Actemra. PCCM consulted for worsening hypoxia  Past Medical History  COPD with chronic hypoxic respiratory failure requiring 4 L [place] supplemental oxygen at all times Chronic diastolic congestive heart failure PAF on Coumadin Prior history of PE CKD stage IIIb Noninsulin-dependent type 2 diabetes Morbid obesity Reactive airway dysfunction  Osteoarthritis  Hypothyroidism GERD  Significant Hospital Events   5/19 Admit 5/20 Transfer to ICU 5/24 Remains on HHF and NRB 5/31 Worsening resp status with desats, tachypnea  Consults:  PCCM  Procedures:    Significant Diagnostic Tests:  Chest x-ray 5/20-cardiomegaly, patchy airspace disease, vascular congestion  Echo 5/19-LVEF 60 to 65%, moderate LA, RA dilation  Micro Data:  5/18 sars2: positive 5/18 blood: negative Antimicrobials/COVID Rx  Solumedrol5/18>> Remdesivir:5/18>> 5/23 Actemra: 5/18 x 1  Interim history/subjective:   Remains on high flow, nonrebreather.   Looks worse today with tachypnea, sats in the mid 80s  Objective   Blood pressure (!) 109/32, pulse 87, temperature 98.1 F (36.7 C), temperature source Axillary, resp. rate (!) 22, height 5' 5.5" (1.664 m), weight (!) 139.6 kg, SpO2 (!) 83 %.    FiO2 (%):  [100 %] 100 %   Intake/Output Summary (Last 24 hours) at 02/29/2020 1644 Last data filed at 02/29/2020 0900 Gross per 24 hour  Intake --  Output 650 ml  Net -650 ml   Filed Weights   02/27/20 0500 02/28/20 0422 02/29/20 0500  Weight: 133.8 kg 133.6 kg (!) 139.6 kg    Examination: Blood pressure (!) 109/32, pulse 87,  temperature 98.1 F (36.7 C), temperature source Axillary, resp. rate (!) 22, height 5' 5.5" (1.664 m), weight (!) 139.6 kg, SpO2 (!) 83 %. Gen:      Obese HEENT:  EOMI, sclera anicteric Neck:     No masses; no thyromegaly Lungs:    Clear to auscultation bilaterally; normal respiratory effort CV:         Regular rate and rhythm; no murmurs Abd:      + bowel sounds; soft, non-tender; no palpable masses, no distension Ext:    No edema; adequate peripheral perfusion Skin:      Warm and dry; no rash Neuro: Awake, anxious  Labs significant for BUN/creatinine 47/1.72, WBC 17.7   Resolved Hospital Problem list     Assessment & Plan:  Severe ARDS secondary to COVID-19 pneumonia Baseline COPD on 4 L oxygen. Continue HHFNC, NRB and oxygen support sat >85% Will attempt BiPAP If no improvement then intubate.  Prognosis overall is poor  Incentive spirometer, mobilize, pulmonary hygiene Given worse x-ray yesterday and GPC's in the sputum will cover for hospital-acquired pneumonia Discussed with Dr. Thedore Mins  CHF Paroxysmal atrial fibrillation on Coumadin anticoagulation as outpatient -Diuresis as tolerated.  Additional Lasix dose now Continue Cardizem Following INR, Coumadin dosing per pharmacy   Chronic kidney disease, AKI Follow urine output  Diabetes -Sliding-scale insulin, -Levemir 10 units twice daily  Hypothyroidism Continue Synthroid  Anxiety She is getting Prozac and scheduled Xanax Add Precedex.  Goals of care Had a good conversation with patient today.  Told her that if she deteriorates further and gets put on life support, ventilator it would mean that she will be  sedated and placed in a coma and will not be able to communication with others.  If she gets on the ventilator than chances of getting off and also possibility survival after cardiac arrest are close to zero.    She wants to be DNR.  Will try intermittent BiPAP and low-dose Precedex for anxiety.  Hope for  antibiotics and extra Lasix to improve her respiratory status.  If she worsens in spite of this then she knows that the only option would be to keep her comfortable.  Best practice:  Diet: PO diet Pain/Anxiety/Delirium protocol (if indicated): NA VAP protocol (if indicated): NA DVT prophylaxis: Coumadin GI prophylaxis: PPI Glucose control: see above Mobility: Bed Code Status: DNR Family Communication: Per primary Disposition: ICU  Critical Critical care time:   The patient is critically ill with multiple organ system failure and requires high complexity decision making for assessment and support, frequent evaluation and titration of therapies, advanced monitoring, review of radiographic studies and interpretation of complex data.   Critical Care Time devoted to patient care services, exclusive of separately billable procedures, described in this note is 45 minutes.   Marshell Garfinkel MD [facility] Please see [email or web address] for pager details.  02/29/2020, 4:53 PM

## 2020-02-29 NOTE — Progress Notes (Signed)
Mother updated via phone on patient status and plan of care. Dicussed patient's continuing increasing oxygenation needs, all questions answered at this time.

## 2020-02-29 NOTE — Progress Notes (Signed)
[facility] MD and CCMD notified of patient's continued worsening respiratory status. RT placed patient on Bipap, patient remains in chair, very anxious. See new orders

## 2020-02-29 NOTE — Progress Notes (Signed)
Pharmacy Antibiotic Note  Amy Bryant is a 64 y.o. female admitted on 02/23/2020 with pneumonia. Pharmacy has been consulted for vancomycin and cefepime dosing.  She presented to [facility] for chronic respiratory failure presenting with COVID-19 pneumonia on 5/18. She was treated with steroids, remdesivir and Actemra.  WBC steadily increasing now up to 17.7, Tmax 98.7, PCT 0.13, Scr 1.72 (baseline appears to be 1.5-1.7 recently).  Plan: Cefepime 2g IV Q12h Vancomycin 2500 mg IV x1 then 2000 mg IV Q48h  -Goal AUC 400-550 -Expected AUC: 483 -SCr used: 1.72 -Vd coefficient: 0.5 F/u clinical progress, c/s, de-escalation, and LOT   Height: 5' 5.5" (166.4 cm) Weight: (!) 139.6 kg (307 lb 12.2 oz) IBW/kg (Calculated) : 58.15  Temp (24hrs), Avg:98.2 F (36.8 C), Min:97.6 F (36.4 C), Max:98.7 F (37.1 C)  Recent Labs  Lab 02/25/20 0228 02/26/20 0337 02/27/20 0604 02/28/20 0805 02/29/20 0603  WBC 14.6* 12.4* 13.9* 16.4* 17.7*  CREATININE 1.65* 1.70* 1.78* 1.59* 1.72*    Estimated Creatinine Clearance: 47.4 mL/min (A) (by C-G formula based on SCr of 1.72 mg/dL (H)).    Allergies  Allergen Reactions  . Pseudoephedrine Other (See Comments)    REACTION: tightness in chest  . Augmentin [Amoxicillin-Pot Clavulanate] Itching and Rash    Did it involve swelling of the face/tongue/throat, SOB, or low BP? No Did it involve sudden or severe rash/hives, skin peeling, or any reaction on the inside of your mouth or nose? Yes Did you need to seek medical attention at a hospital or doctor's office? Yes When did it last happen?2019 If all above answers are "NO", may proceed with cephalosporin use.   . Latex Itching and Rash    Antimicrobials this admission: Redemsivir 5/18>>5/22 Actemra x1 5/19 Zinc / Vit C 5/19 >> SM 5/19 >>5/27 Azithro 5/31 >> Cefepime 5/31 >> Vanc 5/31 >>  Dose adjustments this admission: N/A  Microbiology results: 5/18 Bcx: negative 5/18 COVID: positive   5/26 MRSA PCR: negative 5/30 MRSA PCR: negative 5/30 sputum cx: 1/2 few GPC in pairs  Thank you for allowing pharmacy to be a part of this patient's care.  Domenic Moras, PharmD PGY1 Ambulatory Care Pharmacy Resident 02/29/2020 5:06 PM

## 2020-02-29 NOTE — Progress Notes (Signed)
ANTICOAGULATION CONSULT NOTE  Pharmacy Consult for Coumadin Indication: history of atrial fibrillation and PE  Allergies  Allergen Reactions  . Pseudoephedrine Other (See Comments)    REACTION: tightness in chest  . Augmentin [Amoxicillin-Pot Clavulanate] Itching and Rash    Did it involve swelling of the face/tongue/throat, SOB, or low BP? No Did it involve sudden or severe rash/hives, skin peeling, or any reaction on the inside of your mouth or nose? Yes Did you need to seek medical attention at a hospital or doctor's office? Yes When did it last happen?2019 If all above answers are "NO", may proceed with cephalosporin use.   . Latex Itching and Rash    Patient Measurements: Height: 5' 5.5" (166.4 cm) Weight: (!) 139.6 kg (307 lb 12.2 oz) IBW/kg (Calculated) : 58.15  Vital Signs: Temp: 97.6 F (36.4 C) (05/31 0350) Temp Source: Axillary (05/31 0350) BP: 106/58 (05/31 0914) Pulse Rate: 74 (05/31 0800)  Labs: Recent Labs    02/27/20 0604 02/27/20 0604 02/28/20 0805 02/29/20 0603  HGB 10.6*   < > 11.0* 10.4*  HCT 34.0*  --  36.3 34.3*  PLT 168  --  149* 133*  LABPROT 27.0*  --  23.6* 24.8*  INR 2.6*  --  2.2* 2.3*  CREATININE 1.78*  --  1.59* 1.72*   < > = values in this interval not displayed.    Estimated Creatinine Clearance: 47.4 mL/min (A) (by C-G formula based on SCr of 1.72 mg/dL (H)).   Assessment: 59 YOF admitted with Covid, to continue Coumadin for history of Afib and PE.  Patient had [facility] clinic visit on 02/06/2020 with INR at 4.4.  INR therapeutic today at 2.3 CBC is stable.  No bleeding reported. Intake 75-100% recorded  Home Coumadin dose: 2.5mg  daily except 5mg  on Mon Patient was started on azithromycin which can increase the INR, will continue 2.5mg  tonight instead of her home dose of 5mg  on mondays   Goal of Therapy:  INR 2-3   Plan:   Coumadin 2.5mg  x1 Daily PT / INR  Mon, PharmD PGY2 Infectious Disease Pharmacy  Resident  Please refer to [facility] for [facility] Pharmacy numbers 02/29/2020, 9:45 AM

## 2020-03-01 ENCOUNTER — Inpatient Hospital Stay (HOSPITAL_COMMUNITY): Payer: Medicare HMO

## 2020-03-01 DIAGNOSIS — J441 Chronic obstructive pulmonary disease with (acute) exacerbation: Secondary | ICD-10-CM

## 2020-03-01 DIAGNOSIS — I5033 Acute on chronic diastolic (congestive) heart failure: Secondary | ICD-10-CM

## 2020-03-01 DIAGNOSIS — J9621 Acute and chronic respiratory failure with hypoxia: Secondary | ICD-10-CM

## 2020-03-01 LAB — CBC WITH DIFFERENTIAL/PLATELET
Abs Immature Granulocytes: 0.07 10*3/uL (ref 0.00–0.07)
Basophils Absolute: 0 10*3/uL (ref 0.0–0.1)
Basophils Relative: 0 %
Eosinophils Absolute: 0.2 10*3/uL (ref 0.0–0.5)
Eosinophils Relative: 2 %
HCT: 32.3 % — ABNORMAL LOW (ref 36.0–46.0)
Hemoglobin: 9.7 g/dL — ABNORMAL LOW (ref 12.0–15.0)
Immature Granulocytes: 1 %
Lymphocytes Relative: 4 %
Lymphs Abs: 0.6 10*3/uL — ABNORMAL LOW (ref 0.7–4.0)
MCH: 29.3 pg (ref 26.0–34.0)
MCHC: 30 g/dL (ref 30.0–36.0)
MCV: 97.6 fL (ref 80.0–100.0)
Monocytes Absolute: 0.4 10*3/uL (ref 0.1–1.0)
Monocytes Relative: 3 %
Neutro Abs: 12.4 10*3/uL — ABNORMAL HIGH (ref 1.7–7.7)
Neutrophils Relative %: 90 %
Platelets: 139 10*3/uL — ABNORMAL LOW (ref 150–400)
RBC: 3.31 MIL/uL — ABNORMAL LOW (ref 3.87–5.11)
RDW: 14.8 % (ref 11.5–15.5)
WBC: 13.6 10*3/uL — ABNORMAL HIGH (ref 4.0–10.5)
nRBC: 0 % (ref 0.0–0.2)

## 2020-03-01 LAB — CULTURE, RESPIRATORY W GRAM STAIN: Culture: NORMAL

## 2020-03-01 LAB — MAGNESIUM: Magnesium: 2.3 mg/dL (ref 1.7–2.4)

## 2020-03-01 LAB — COMPREHENSIVE METABOLIC PANEL
ALT: 8 U/L (ref 0–44)
AST: 13 U/L — ABNORMAL LOW (ref 15–41)
Albumin: 2.4 g/dL — ABNORMAL LOW (ref 3.5–5.0)
Alkaline Phosphatase: 91 U/L (ref 38–126)
Anion gap: 9 (ref 5–15)
BUN: 52 mg/dL — ABNORMAL HIGH (ref 8–23)
CO2: 29 mmol/L (ref 22–32)
Calcium: 9.8 mg/dL (ref 8.9–10.3)
Chloride: 101 mmol/L (ref 98–111)
Creatinine, Ser: 1.62 mg/dL — ABNORMAL HIGH (ref 0.44–1.00)
GFR calc Af Amer: 38 mL/min — ABNORMAL LOW (ref >60)
GFR calc non Af Amer: 33 mL/min — ABNORMAL LOW (ref >60)
Glucose, Bld: 93 mg/dL (ref 70–99)
Potassium: 4 mmol/L (ref 3.5–5.1)
Sodium: 139 mmol/L (ref 135–145)
Total Bilirubin: 0.6 mg/dL (ref 0.3–1.2)
Total Protein: 5.2 g/dL — ABNORMAL LOW (ref 6.5–8.1)

## 2020-03-01 LAB — PROTIME-INR
INR: 2.4 — ABNORMAL HIGH (ref 0.8–1.2)
Prothrombin Time: 25.5 seconds — ABNORMAL HIGH (ref 11.4–15.2)

## 2020-03-01 LAB — GLUCOSE, CAPILLARY
Glucose-Capillary: 94 mg/dL (ref 70–99)
Glucose-Capillary: 88 mg/dL (ref 70–99)
Glucose-Capillary: 116 mg/dL — ABNORMAL HIGH (ref 70–99)
Glucose-Capillary: 134 mg/dL — ABNORMAL HIGH (ref 70–99)

## 2020-03-01 LAB — PROCALCITONIN: Procalcitonin: 0.16 ng/mL

## 2020-03-01 LAB — BRAIN NATRIURETIC PEPTIDE: B Natriuretic Peptide: 233.4 pg/mL — ABNORMAL HIGH (ref 0.0–100.0)

## 2020-03-01 MED ORDER — ALPRAZOLAM 0.5 MG PO TABS
0.5000 mg | ORAL_TABLET | Freq: Every evening | ORAL | Status: DC | PRN
  Administered 2020-03-01: 0.5 mg via ORAL
  Filled 2020-03-01: qty 1

## 2020-03-01 MED ORDER — WARFARIN SODIUM 2.5 MG PO TABS
2.5000 mg | ORAL_TABLET | Freq: Once | ORAL | Status: AC
  Administered 2020-03-01: 2.5 mg via ORAL
  Filled 2020-03-01: qty 1

## 2020-03-01 MED ORDER — FUROSEMIDE 10 MG/ML IJ SOLN
60.0000 mg | Freq: Once | INTRAMUSCULAR | Status: AC
  Administered 2020-03-01: 60 mg via INTRAVENOUS
  Filled 2020-03-01: qty 6

## 2020-03-01 NOTE — Progress Notes (Signed)
ANTICOAGULATION CONSULT NOTE  Pharmacy Consult for Coumadin Indication: history of atrial fibrillation and PE  Allergies  Allergen Reactions  . Pseudoephedrine Other (See Comments)    REACTION: tightness in chest  . Augmentin [Amoxicillin-Pot Clavulanate] Itching and Rash    Has tolerated zosyn and cefazolin (2021)  Did it involve swelling of the face/tongue/throat, SOB, or low BP? No Did it involve sudden or severe rash/hives, skin peeling, or any reaction on the inside of your mouth or nose? Yes Did you need to seek medical attention at a hospital or doctor's office? Yes When did it last happen?2019 If all above answers are "NO", may proceed with cephalosporin use.   . Latex Itching and Rash    Patient Measurements: Height: 5' 5.5" (166.4 cm) Weight: (!) 139.2 kg (306 lb 14.1 oz) IBW/kg (Calculated) : 58.15  Vital Signs: Temp: 98.2 F (36.8 C) (06/01 0739) Temp Source: Oral (06/01 0739) BP: 114/68 (06/01 0630) Pulse Rate: 63 (06/01 0620)  Labs: Recent Labs    02/28/20 0805 02/28/20 0805 02/29/20 0603 03/01/20 0634  HGB 11.0*   < > 10.4* 9.7*  HCT 36.3  --  34.3* 32.3*  PLT 149*  --  133* 139*  LABPROT 23.6*  --  24.8* 25.5*  INR 2.2*  --  2.3* 2.4*  CREATININE 1.59*  --  1.72* 1.62*   < > = values in this interval not displayed.    Estimated Creatinine Clearance: 50.2 mL/min (A) (by C-G formula based on SCr of 1.62 mg/dL (H)).   Assessment: 73 YOF admitted with Covid, to continue Coumadin for history of Afib and PE.  Patient had [facility] clinic visit on 2020-03-09 with INR at 4.4.  INR therapeutic today at 2.4 CBC is stable.  No bleeding reported.  Home Coumadin dose: 2.5mg  daily except 5mg  on Mon/Fri Patient was started on azithromycin which can increase the INR - need to watch closely   Goal of Therapy:  INR 2-3   Plan:   Coumadin 2.5 mg x1 Daily PT / INR  10-14-2003, PharmD, BCPS, BCCCP Clinical Pharmacist [phone]  Please check AMION for all  [facility] Pharmacy numbers  03/01/2020 8:39 AM

## 2020-03-01 NOTE — Progress Notes (Signed)
   NAME:  Amy Bryant, MRN:  [id number], DOB:  02/22/1956, LOS: 14 ADMISSION DATE:  02/15/2020, CONSULTATION DATE:  5/20 REFERRING MD:  Jerral Ralph, CHIEF COMPLAINT:  Dyspnea   Brief History   64 year old with past medical history including COPD, CKD, chronic respiratory failure presenting with COVID-19 pneumonia on 5/18. Treated with steroids, remdesivir and Actemra. PCCM consulted for worsening hypoxia  Past Medical History  COPD with chronic hypoxic respiratory failure requiring 4 LNCsupplemental oxygen at all times Chronic diastolic congestive heart failure OSA on CPAP at night PAF on Coumadin Prior history of PE CKD stage IIIb Noninsulin-dependent type 2 diabetes Morbid obesity Reactive airway dysfunction  Osteoarthritis  Hypothyroidism GERD  Significant Hospital Events   5/19 Admit 5/20 Transfer to ICU 5/24 Remains on HHF and NRB 5/31 Worsening resp status with desats, tachypnea, made DNR, started BIPAP prn and qHS  Consults:  PCCM  Procedures:    Significant Diagnostic Tests:  Chest x-ray 5/20-cardiomegaly, patchy airspace disease, vascular congestion  Echo 5/19-LVEF 60 to 65%, moderate LA, RA dilation  Micro Data:  5/18 sars2: positive 5/18 blood: negative  Antimicrobials:  Solumedrol5/18>> Remdesivir:5/18>> 5/23 Actemra: 5/18 x 1   Interim history/subjective:  Feels better after sleeping with BIPAP Eating well Doesn't feel dyspneic Has been out of bed  Objective   Blood pressure 114/68, pulse 63, temperature 98.4 F (36.9 C), temperature source Axillary, resp. rate 20, height 5' 5.5" (1.664 m), weight (!) 139.2 kg, SpO2 96 %.    FiO2 (%):  [100 %] 100 %   Intake/Output Summary (Last 24 hours) at 03/01/2020 0736 Last data filed at 03/01/2020 0600 Gross per 24 hour  Intake 658.62 ml  Output 850 ml  Net -191.38 ml   Filed Weights   02/28/20 0422 02/29/20 0500 03/01/20 0500  Weight: 133.6 kg (!) 139.6 kg (!) 139.2 kg     Examination:  General:  Resting comfortably in bed HENT: NCAT OP clear PULM: Crackles bases B, normal effort CV: RRR, no mgr GI: BS+, soft, nontender MSK: normal bulk and tone Neuro: awake, alert, no distress, MAEW  6/1 CXR > severe bilateral airspace opacities  Resolved Hospital Problem list     Assessment & Plan:  Severe ARDS secondary to COVID-19 pneumonia Baseline COPD on 4 L oxygen. OSA on CPAP at home Continue HHFNC and NRB Continue BIPAP qhS and PRN Incentive spirometry, flutter, out of bed Agree with HCAP coverage Diurese as able Tolerate periods of hypoxemia, goal at rest is greater than 85% SaO2, with movement ideally above 75% Decision for intubation should be based on a change in mental status or physical evidence of ventilatory failure such as nasal flaring, accessory muscle use, paradoxical breathing Out of bed to chair as able Incentive spirometry is important, use every hour Prone positioning while in bed   CHF Paroxysmal atrial fibrillation on Coumadin anticoagulation as outpatient tele Lasix as tolerated Continue cardizem coumadine per pharmacy  Anxiety Precedex, xanax and prozac  Code DNR  Best practice:   Per [facility]   Critical care time: 35 minutes    Heber Iberia, MD [place] [facility] Pager: [phone] Cell: [phone] If no response, call [phone]

## 2020-03-01 NOTE — Progress Notes (Signed)
PT Cancellation Note  Patient Details Name: Amy Bryant MRN: [id number] DOB: 07/30/1956   Cancelled Treatment:    Reason Eval/Treat Not Completed: Medical issues which prohibited therapy.  Please hold per RN. 03/01/2020  Jacinto Halim., PT Acute Rehabilitation Services [phone]  (pager) [phone]  (office)   Eliseo Gum Aliene Tamura 03/01/2020, 4:09 PM

## 2020-03-01 NOTE — Progress Notes (Signed)
OT Cancellation Note  Patient Details Name: Amy Bryant MRN: [id number] DOB: 1956/06/01   Cancelled Treatment:    Reason Eval/Treat Not Completed: Medical issues which prohibited therapy; spoke with RN, pt remains very tenuous, noted continuing to require HFNC + NRB. Will follow up for OT treatment as able.  Marcy Siren, OT Acute Rehabilitation Services Pager [phone] Office [phone]   Orlando Penner 03/01/2020, 11:03 AM

## 2020-03-01 NOTE — Progress Notes (Signed)
PROGRESS NOTE                                                                                                                                                                                                             Patient Demographics:    Amy Bryant, is a 64 y.o. female, DOB - 1955-11-21, VQQ:[id number]  Outpatient Primary MD for the patient is [name], [name], MD   Admit date - Feb 23, 2020   LOS - 14  CC - SOB     Brief Narrative:  Patient is a 64 y.o. female with PMHx of COPD with chronic hypoxic respiratory failure on 4 L of oxygen at home, chronic diastolic heart failure, PAF on Coumadin, history of PE, CKD stage IIIb, non-insulin-dependent DM-2, morbid obesity-who presented to the hospital with approximately 2-3-day history of worsening shortness of breath.  For approximately 1 week-she has been having nasal congestion/URI symptoms-her PCP called in prednisone and Zithromax which she took without any relief.  In the emergency room-she was noted to have severe hypoxemia-requiring NRB-she was subsequently admitted to the hospitalist service.  Note-has not received the Covid vaccine.  Significant Events: 5/18>> admit to [facility] for severe hypoxemia requiring nonrebreather mask. 5/19>> on heated high flow 5/20>> on heated high flow+ NRB-transfer to MICU  COVID-19 medications: Steroids: 5/18>> Remdesivir: 5/18>>5/22 Actemra: 5/18 x 1  Antibiotics: None  Microbiology data: 5/18: Blood culture>> no growth  Significant studies: 5/20: Bilateral lower extremity Doppler>> negative for DVT. 5/19: Echo>> EF 60-65% 5/19: Chest x-ray>> interstitial pulmonary edema/patchy airspace opacity in the left base 5/18: Chest x-ray>> new diffuse interstitial thickening/reticular opacities suspicious for pulmonary edema  DVT prophylaxis: Coumadin-supratherapeutic INR.  Procedures: None  Consults: PCCM    Subjective:    Patient in bed, appears comfortable, denies any headache, no fever, no chest pain or pressure, +ve shortness of breath , no abdominal pain. No focal weakness.   Assessment  & Plan :   Acute on chronic hypoxic Resp Failure due to Covid 19 Viral pneumonia and decompensated diastolic heart failure: She had severe disease and likely presented once her parenchymal damage was quite advanced due to COVID-19 pneumonia and inflammation, received appropriate care with IV steroids, remdesivir and Actemra.  She has finished her steroid course along with IV Actemra and remdesivir. She has incurred severe parenchymal lung injury from COVID-19 pneumonia  and will take several weeks before she can come back to her baseline, continues to be on 40 to 45 L of heated high flow oxygen along with as needed nonrebreather mask at times along with as needed BiPAP once she starts getting tired, IV Lasix as needed, antibiotic started for a productive cough on 02/29/2020 by PCCM.  She is now getting gradually tired because of the continued respiratory effort continue combination of heated high flow, nonrebreather mask and as needed BiPAP, situation is extremely tenuous, she is now DNR, PCCM also following.   Encouraged her to sit up in the chair in the daytime and use I-S and flutter valve for pulmonary toiletry.  Continues to have productive cough, sputum Gram stain noted, nasal MRSA swab negative, ABX per PCCM, continue to trend inflammatory markers including procalcitonin, margin of error is extremely low.  O2 requirements:  SpO2: 96 % O2 Flow Rate (L/min): (S) 45 L/min(+15 L NRB) FiO2 (%): 100 %   COVID-19 Labs: No results for input(s): DDIMER, FERRITIN, LDH, CRP in the last 72 hours.     Component Value Date/Time   BNP 233.4 (H) 03/01/2020 0642    Recent Labs  Lab 02/27/20 0604 02/28/20 0805 02/29/20 0603 03/01/20 0634  PROCALCITON <0.10 <0.10 0.13 0.16    Lab Results  Component Value Date    SARSCOV2NAA POSITIVE (A) 2020/03/01   SARSCOV2NAA NEGATIVE 10/06/2019   SARSCOV2NAA NOT DETECTED 04/08/2019   SARSCOV2NAA NOT DETECTED 04/02/2019    Decompensated acute on chronic diastolic heart failure EF 60%: IV Lasix repeat on 03/01/2020.  Elevated D-dimer: On chronic Coumadin, INR in good range and D-dimer now trending down, lower extremity venous duplex unremarkable.  COPD: Not in exacerbation this morning-no wheezing-decrease steroids to twice daily dosing.  Continue bronchodilators.  Normally on 4 L of oxygen at all times at home.  AKI on CKD stage IIIb: Baseline creatinine around 1.8, monitor cautiously with diuresis, [place] close to baseline.  PAF with [place] vas 2 score of at least 3 - goal is rate control, blood pressure slightly soft hence Cardizem dose dropped further on 02/27/2020, pharmacy monitoring Coumadin and INR.  Remote history of pulmonary embolism: on Coumadin pharmacy following.  Hypothyroidism: Continue Synthroid  Depression/anxiety: Slightly anxious due to acute illness/hypoxia/oxygen requirement-continue as needed Xanax and Prozac.  DM-2 (A1c 5.2 on 10/07/2019): CBGs stable, Lantus dose dropped on 02/25/2020 as steroids are now tapered off.  Will monitor closely.  Recent Labs    02/29/20 1652 02/29/20 2046 03/01/20 0726  GLUCAP 165* 136* 94   Morbid Obesity: BMI of 48 follow with PCP for weight loss.    GI prophylaxis: PPI  Condition - Extremely Guarded-very tenuous with risk for further deterioration and possible death this admission.  Family Communication  : Mother updated 02/29/20   Code Status : DNR  Diet :  Diet Order            Diet heart healthy/carb modified Room service appropriate? Yes; Fluid consistency: Thin; Fluid restriction: 1200 mL Fluid  Diet effective now               Disposition Plan  :   Status is: Inpatient  Remains inpatient appropriate because:Inpatient level of care appropriate due to severity of illness   Dispo:  The patient is from: Home              Anticipated d/c is to: SNF              Anticipated d/c date  is: > 3 days              Patient currently is not medically stable to d/c.  Barriers to discharge: Severe Hypoxia requiring +++ O2 supplementation  Antimicorbials  :    Anti-infectives (From admission, onward)   Start     Dose/Rate Route Frequency Ordered Stop   03/13/2020 1800  vancomycin (VANCOREADY) IVPB 2000 mg/400 mL  Status:  Discontinued     2,000 mg 200 mL/hr over 120 Minutes Intravenous Every 48 hours 02/29/20 1729 03/01/20 0916   02/29/20 1730  vancomycin (VANCOCIN) 2,500 mg in sodium chloride 0.9 % 500 mL IVPB     2,500 mg 250 mL/hr over 120 Minutes Intravenous  Once 02/29/20 1729 02/29/20 2254   02/29/20 1715  ceFEPIme (MAXIPIME) 2 g in sodium chloride 0.9 % 100 mL IVPB     2 g 200 mL/hr over 30 Minutes Intravenous Every 12 hours 02/29/20 1714     02/29/20 0715  azithromycin (ZITHROMAX) tablet 500 mg     500 mg Oral Daily 02/29/20 0713 03/04/20 0959   02/17/20 1000  remdesivir 100 mg in sodium chloride 0.9 % 100 mL IVPB     100 mg 200 mL/hr over 30 Minutes Intravenous Daily Jan 24, 2020 2202 02/20/20 0955   Jan 24, 2020 2215  remdesivir 200 mg in sodium chloride 0.9% 250 mL IVPB     200 mg 580 mL/hr over 30 Minutes Intravenous Once Jan 24, 2020 2202 Jan 24, 2020 2319      Inpatient Medications  Scheduled Meds: . vitamin C  500 mg Oral Daily  . azithromycin  500 mg Oral Daily  . Chlorhexidine Gluconate Cloth  6 each Topical Daily  . cholecalciferol  1,000 Units Oral Daily  . diltiazem  120 mg Oral Daily  . FLUoxetine  40 mg Oral Daily  . fluticasone  1 spray Each Nare Daily  . fluticasone furoate-vilanterol  1 puff Inhalation Daily  . furosemide  60 mg Intravenous Once  . insulin aspart  0-5 Units Subcutaneous QHS  . insulin aspart  0-9 Units Subcutaneous TID WC  . insulin detemir  8 Units Subcutaneous Daily  . iron polysaccharides  300 mg Oral Daily  . levothyroxine  175 mcg  Oral Q0600  . pantoprazole  40 mg Oral Q1200  . senna-docusate  1 tablet Oral BID  . simvastatin  20 mg Oral QHS  . umeclidinium bromide  1 puff Inhalation Daily  . warfarin  2.5 mg Oral ONCE-1600  . Warfarin - Pharmacist Dosing Inpatient   Does not apply q1600  . zinc sulfate  220 mg Oral Daily   Continuous Infusions: . ceFEPime (MAXIPIME) IV Stopped (02/29/20 2010)  . dexmedetomidine (PRECEDEX) IV infusion 1 mcg/kg/hr (03/01/20 0408)   PRN Meds:.acetaminophen, albuterol, ALPRAZolam, chlorpheniramine-HYDROcodone, diltiazem, docusate sodium, guaiFENesin-dextromethorphan, lip balm, oxymetazoline, simethicone, sodium chloride, traMADol   Time Spent in minutes  35    See all Orders from today for further details   Susa RaringPrashant Aster Eckrich M.D on 03/01/2020 at 10:01 AM  To page go to [email or web address] - use universal password  [facility] -  Office  [phone]    Objective:   Vitals:   03/01/20 0620 03/01/20 0630 03/01/20 0739 03/01/20 0900  BP:  114/68  98/85  Pulse: 63     Resp: 20     Temp:   98.2 F (36.8 C)   TempSrc:   Oral   SpO2: 96%     Weight:      Height:  Wt Readings from Last 3 Encounters:  03/01/20 (!) 139.2 kg  01/28/20 132 kg  01/06/20 132 kg     Intake/Output Summary (Last 24 hours) at 03/01/2020 1001 Last data filed at 03/01/2020 0600 Gross per 24 hour  Intake 658.62 ml  Output 400 ml  Net 258.62 ml     Physical Exam  Awake Alert, No new F.N deficits, Normal affect [place].AT,PERRAL Supple Neck,No JVD, No cervical lymphadenopathy appriciated.  Symmetrical Chest wall movement, Good air movement bilaterally, +ve rales RRR,No Gallops, Rubs or new Murmurs, No Parasternal Heave +ve B.Sounds, Abd Soft, No tenderness, No organomegaly appriciated, No rebound - guarding or rigidity. No Cyanosis, Clubbing or edema, No new Rash or bruise     Data Review:    CBC Recent Labs  Lab 02/26/20 0337 02/27/20 0604 02/28/20 0805 02/29/20 0603  03/01/20 0634  WBC 12.4* 13.9* 16.4* 17.7* 13.6*  HGB 10.7* 10.6* 11.0* 10.4* 9.7*  HCT 33.8* 34.0* 36.3 34.3* 32.3*  PLT 199 168 149* 133* 139*  MCV 93.4 95.0 96.8 97.7 97.6  MCH 29.6 29.6 29.3 29.6 29.3  MCHC 31.7 31.2 30.3 30.3 30.0  RDW 13.7 14.2 14.6 14.6 14.8  LYMPHSABS 0.5* 1.0 0.7 0.6* 0.6*  MONOABS 0.6 0.6 0.5 0.5 0.4  EOSABS 0.0 0.2 0.1 0.2 0.2  BASOSABS 0.0 0.0 0.0 0.0 0.0    Chemistries  Recent Labs  Lab 02/26/20 0337 02/27/20 0604 02/28/20 0805 02/29/20 0603 03/01/20 0634  NA 135 138 136 137 139  K 4.4 4.1 3.9 4.2 4.0  CL 99 102 101 102 101  CO2 27 30 25 28 29   GLUCOSE 190* 136* 98 118* 93  BUN 53* 50* 51* 47* 52*  CREATININE 1.70* 1.78* 1.59* 1.72* 1.62*  CALCIUM 9.7 9.4 9.5 9.7 9.8  MG 2.3 2.1 2.4 2.3 2.3  AST 12* 11* 12* 13* 13*  ALT 12 11 10 11 8   ALKPHOS 82 67 81 90 91  BILITOT 0.4 0.7 0.5 0.2* 0.6   ------------------------------------------------------------------------------------------------------------------ No results for input(s): CHOL, HDL, LDLCALC, TRIG, CHOLHDL, LDLDIRECT in the last 72 hours.  Lab Results  Component Value Date   HGBA1C 5.8 (H) 02/17/2020   ------------------------------------------------------------------------------------------------------------------ No results for input(s): TSH, T4TOTAL, T3FREE, THYROIDAB in the last 72 hours.  Invalid input(s): FREET3 ------------------------------------------------------------------------------------------------------------------ No results for input(s): VITAMINB12, FOLATE, FERRITIN, TIBC, IRON, RETICCTPCT in the last 72 hours.  Coagulation profile Recent Labs  Lab 02/26/20 0337 02/27/20 0604 02/28/20 0805 02/29/20 0603 03/01/20 0634  INR 3.1* 2.6* 2.2* 2.3* 2.4*    No results for input(s): DDIMER in the last 72 hours.  Cardiac Enzymes No results for input(s): CKMB, TROPONINI, MYOGLOBIN in the last 168 hours.  Invalid input(s):  CK ------------------------------------------------------------------------------------------------------------------    Component Value Date/Time   BNP 233.4 (H) 03/01/2020 4268    Micro Results Recent Results (from the past 240 hour(s))  MRSA PCR Screening     Status: None   Collection Time: 02/24/20 10:25 AM   Specimen: Nasal Mucosa; Nasopharyngeal  Result Value Ref Range Status   MRSA by PCR NEGATIVE NEGATIVE Final    Comment:        The GeneXpert MRSA Assay (FDA approved for NASAL specimens only), is one component of a comprehensive MRSA colonization surveillance program. It is not intended to diagnose MRSA infection nor to guide or monitor treatment for MRSA infections. Performed at [facility], [place] [address] [place], [place] [address]   Expectorated sputum assessment w rflx to resp cult  Status: None   Collection Time: 02/28/20 10:18 AM   Specimen: Expectorated Sputum  Result Value Ref Range Status   Specimen Description Expect. Sput  Final   Special Requests NONE  Final   Sputum evaluation   Final    THIS SPECIMEN IS ACCEPTABLE FOR SPUTUM CULTURE Performed at [facility], [address] [place], [place] [address]    Report Status 02/28/2020 FINAL  Final  MRSA PCR Screening     Status: None   Collection Time: 02/28/20 10:18 AM   Specimen: Nasopharyngeal  Result Value Ref Range Status   MRSA by PCR NEGATIVE NEGATIVE Final    Comment:        The GeneXpert MRSA Assay (FDA approved for NASAL specimens only), is one component of a comprehensive MRSA colonization surveillance program. It is not intended to diagnose MRSA infection nor to guide or monitor treatment for MRSA infections. Performed at [facility], [address], [place], [place] [address]   Culture, respiratory     Status: None   Collection Time: 02/28/20 10:18 AM  Result Value Ref Range Status   Specimen Description Expect. Sput  Final   Special Requests NONE Reflexed  from [id number]  Final   Gram Stain   Final    RARE WBC PRESENT,BOTH PMN AND MONONUCLEAR FEW GRAM POSITIVE COCCI IN PAIRS    Culture   Final    MODERATE Consistent with normal respiratory flora. Performed at [facility], [address] [place], [place] [address]    Report Status 03/01/2020 FINAL  Final    Radiology Reports DG CHEST PORT 1 VIEW  Result Date: 03/01/2020 CLINICAL DATA:  Acute respiratory failure. EXAM: PORTABLE CHEST 1 VIEW COMPARISON:  02/28/2020. FINDINGS: Stable cardiomegaly. Diffuse bilateral pulmonary infiltrates/edema again noted. Small bilateral pleural effusions cannot be excluded. Similar findings noted on prior exam. No pneumothorax. IMPRESSION: Stable cardiomegaly. Diffuse bilateral pulmonary infiltrates/edema again noted. Small bilateral pleural effusions cannot be excluded. Similar findings noted on prior exam. Electronically Signed   By: Maisie Fus  Register   On: 03/01/2020 07:09   DG Chest Port 1 View  Result Date: 02/28/2020 CLINICAL DATA:  Shortness of breath.  COVID-19 positive EXAM: PORTABLE CHEST 1 VIEW COMPARISON:  Feb 26, 2020 FINDINGS: There is apparent underlying fibrosis. There is airspace consolidation throughout the right mid and lower lung zones, a change from 2 days prior. There is a small pleural effusion on each side, stable. There is ill-defined opacity in the lateral left base which has increased from 2 days prior. There is cardiomegaly with a degree of pulmonary venous hypertension. No adenopathy evident. There is aortic atherosclerosis. No bone lesions. IMPRESSION: Multifocal airspace opacity, particularly on the right, a new finding likely representing multifocal pneumonia. A degree of alveolar edema superimposed is possible. Underlying fibrotic change. A degree of interstitial edema superimposed is question. The interstitium appear stable compared to the previous study. Note that there may be a degree of congestive heart failure. There are small  pleural effusions bilaterally. Stable cardiomegaly with pulmonary vascular congestion. Aortic Atherosclerosis (ICD10-I70.0). Electronically Signed   By: Bretta Bang III M.D.   On: 02/28/2020 09:20   DG Chest Port 1 View  Result Date: 02/26/2020 CLINICAL DATA:  64 year old female with respiratory failure. Chronic lung disease. EXAM: PORTABLE CHEST 1 VIEW COMPARISON:  Portable chest 02/23/2020 and earlier. FINDINGS: Portable AP upright view at 0806 hours. Stable cardiomegaly and mediastinal contours. Visualized tracheal air column is within normal limits. Coarse  and confluent bilateral pulmonary opacity, basilar predominant. No superimposed pneumothorax or pleural effusion. Although bilateral lung base ventilation appears mildly worsened since 02/20/2020. No acute osseous abnormality identified. IMPRESSION: Chronic lung disease and cardiomegaly with basilar predominant abnormal pulmonary opacity most suggestive of pneumonia. Mild progression since 02/20/2020. No pleural effusion identified. Electronically Signed   By: Odessa Fleming M.D.   On: 02/26/2020 08:36   DG Chest Port 1 View  Result Date: 02/23/2020 CLINICAL DATA:  COVID pneumonia. EXAM: PORTABLE CHEST 1 VIEW COMPARISON:  One-view chest x-ray 02/20/2020 FINDINGS: Heart is enlarged. Atherosclerotic calcifications are present at the aortic arch. Diffuse interstitial and airspace disease is present. Consolidation at the left base is slightly less dense. A diffuse interstitial pattern has increased some. IMPRESSION: 1. Diffuse interstitial and airspace disease compatible with pneumonia. An element of edema is not excluded. 2. Consolidation at the left base is slightly less dense. Electronically Signed   By: Marin Roberts M.D.   On: 02/23/2020 08:49   DG Chest Port 1 View  Result Date: 02/20/2020 CLINICAL DATA:  Shortness of breath, history of COVID-19 positivity EXAM: PORTABLE CHEST 1 VIEW COMPARISON:  02/17/2020 FINDINGS: Cardiac shadow is  stable but enlarged. Aortic calcifications are seen. Patchy opacities are noted primarily in the left base consistent with the given clinical history. Mild vascular congestion is noted centrally. No other focal abnormality is seen. IMPRESSION: Mild vascular congestion. Persistent opacity in the left base consistent with the given clinical history. Electronically Signed   By: Alcide Clever M.D.   On: 02/20/2020 08:29   DG Chest Port 1 View  Result Date: 2020-03-15 CLINICAL DATA:  Dyspnea, fever. EXAM: PORTABLE CHEST 1 VIEW COMPARISON:  Radiograph 10/06/2019 FINDINGS: Significant interval change from prior exam with diffuse interstitial thickening and reticular opacities. There is also perivascular haziness. Cardiomegaly with unchanged mediastinal contours. Aortic atherosclerosis. Possible small pleural effusions. No visualized pneumothorax. IMPRESSION: New diffuse interstitial thickening and reticular opacities suspicious for pulmonary edema. Similar cardiomegaly. Possible small pleural effusions. Findings suspicious for CHF. Electronically Signed   By: Narda Rutherford M.D.   On: 03/15/20 18:11   DG Chest Port 1V same Day  Result Date: 02/17/2020 CLINICAL DATA:  Shortness of breath EXAM: PORTABLE CHEST 1 VIEW COMPARISON:  03/15/2020 FINDINGS: There is cardiomegaly with pulmonary venous hypertension. There is interstitial edema with patchy airspace opacity in the left base. No adenopathy. There is aortic atherosclerosis. No bone lesions. IMPRESSION: Cardiomegaly with pulmonary vascular congestion. There is interstitial pulmonary edema. Suspect a degree of congestive heart failure. Patchy airspace opacity in the left base is concerning for superimposed pneumonia, although alveolar edema in this area could present in this manner. Both pneumonia and alveolar edema could present concurrently. In comparison with 1 day prior, there appears to be somewhat less interstitial edema. Aortic Atherosclerosis  (ICD10-I70.0). Electronically Signed   By: Bretta Bang III M.D.   On: 02/17/2020 08:01   ECHOCARDIOGRAM COMPLETE  Result Date: 02/17/2020    ECHOCARDIOGRAM REPORT   Patient Name:   LEGACIE DILLINGHAM Date of Exam: 02/17/2020 Medical Rec #:  [id number]      Height:       65.5 in Accession #:    [id number]     Weight:       290.3 lb Date of Birth:  1956/02/01      BSA:          2.331 m Patient Age:    63 years       BP:  120/67 mmHg Patient Gender: F              HR:           75 bpm. Exam Location:  Inpatient Procedure: 2D Echo Indications:    CHF 428  History:        Patient has prior history of Echocardiogram examinations, most                 recent 01/09/2019. CHF, COPD, Arrythmias:Atrial Fibrillation;                 Risk Factors:Diabetes and Former Smoker. Covid +. cor pulmonale.                 pulmonary embolism.  Sonographer:    Celene Skeen RDCS (AE) Referring Phys: [id number] John Giovanni  Sonographer Comments: Patient is morbidly obese. Image acquisition challenging due to patient body habitus and Image acquisition challenging due to respiratory motion. limited mobility IMPRESSIONS  1. Left ventricular ejection fraction, by estimation, is 60 to 65%. The left ventricle has normal function. The left ventricle has no regional wall motion abnormalities. Left ventricular diastolic parameters are indeterminate.  2. Right ventricular systolic function was not well visualized. The right ventricular size is not well visualized.  3. Left atrial size was moderately dilated.  4. Right atrial size was mild to moderately dilated.  5. The mitral valve is normal in structure. No evidence of mitral valve regurgitation. No evidence of mitral stenosis.  6. The aortic valve was not well visualized. Aortic valve regurgitation is trivial. Mild to moderate aortic valve sclerosis/calcification is present, without any evidence of aortic stenosis.  7. The inferior vena cava not well seen. FINDINGS  Left Ventricle:  Left ventricular ejection fraction, by estimation, is 60 to 65%. The left ventricle has normal function. The left ventricle has no regional wall motion abnormalities. The left ventricular internal cavity size was normal in size. There is  no left ventricular hypertrophy. Left ventricular diastolic parameters are indeterminate. Right Ventricle: The right ventricular size is not well visualized. Right vetricular wall thickness was not assessed. Right ventricular systolic function was not well visualized. Left Atrium: Left atrial size was moderately dilated. Right Atrium: Right atrial size was mild to moderately dilated. Pericardium: There is no evidence of pericardial effusion. Mitral Valve: The mitral valve is normal in structure. There is mild thickening of the mitral valve leaflet(s). There is mild calcification of the mitral valve leaflet(s). Normal mobility of the mitral valve leaflets. Moderate mitral annular calcification. No evidence of mitral valve regurgitation. No evidence of mitral valve stenosis. Tricuspid Valve: The tricuspid valve is normal in structure. Tricuspid valve regurgitation is mild . No evidence of tricuspid stenosis. Aortic Valve: The aortic valve was not well visualized. Aortic valve regurgitation is trivial. Mild to moderate aortic valve sclerosis/calcification is present, without any evidence of aortic stenosis. Pulmonic Valve: The pulmonic valve was normal in structure. Pulmonic valve regurgitation is not visualized. No evidence of pulmonic stenosis. Aorta: The aortic root is normal in size and structure. Venous: The inferior vena cava not well seen. IAS/Shunts: The interatrial septum was not well visualized.  LEFT VENTRICLE PLAX 2D LVIDd:         5.30 cm LVIDs:         3.40 cm LV PW:         1.10 cm LV IVS:        1.00 cm LVOT diam:     1.80 cm LV SV:  38 LV SV Index:   16 LVOT Area:     2.54 cm  RIGHT VENTRICLE RV S prime:     14.30 cm/s TAPSE (M-mode): 1.9 cm LEFT ATRIUM            Index       RIGHT ATRIUM           Index LA diam:      4.80 cm 2.06 cm/m  RA Area:     17.70 cm LA Vol (A4C): 44.0 ml 18.88 ml/m RA Volume:   45.20 ml  19.39 ml/m  AORTIC VALVE LVOT Vmax:   83.90 cm/s LVOT Vmean:  59.500 cm/s LVOT VTI:    0.150 m  AORTA Ao Root diam: 3.40 cm MITRAL VALVE MV Area (PHT): 3.48 cm    SHUNTS MV Decel Time: 218 msec    Systemic VTI:  0.15 m MV E velocity: 73.70 cm/s  Systemic Diam: 1.80 cm Charlton Haws MD Electronically signed by Charlton Haws MD Signature Date/Time: 02/17/2020/3:39:01 PM    Final    VAS [place] LOWER EXTREMITY VENOUS (DVT)  Result Date: 02/18/2020  Lower Venous DVTStudy Indications: Swelling, and Edema.  Comparison Study: no prior Performing Technologist: Blanch Media RVS  Examination Guidelines: A complete evaluation includes B-mode imaging, spectral Doppler, color Doppler, and power Doppler as needed of all accessible portions of each vessel. Bilateral testing is considered an integral part of a complete examination. Limited examinations for reoccurring indications may be performed as noted. The reflux portion of the exam is performed with the patient in reverse Trendelenburg.  +---------+---------------+---------+-----------+----------+--------------+ RIGHT    CompressibilityPhasicitySpontaneityPropertiesThrombus Aging +---------+---------------+---------+-----------+----------+--------------+ CFV      Full           Yes      Yes                                 +---------+---------------+---------+-----------+----------+--------------+ SFJ      Full                                                        +---------+---------------+---------+-----------+----------+--------------+ FV Prox  Full                                                        +---------+---------------+---------+-----------+----------+--------------+ FV Mid   Full                                                         +---------+---------------+---------+-----------+----------+--------------+ FV DistalFull                                                        +---------+---------------+---------+-----------+----------+--------------+ PFV      Full                                                        +---------+---------------+---------+-----------+----------+--------------+  POP      Full           Yes      Yes                                 +---------+---------------+---------+-----------+----------+--------------+ PTV      Full                                                        +---------+---------------+---------+-----------+----------+--------------+ PERO     Full                                                        +---------+---------------+---------+-----------+----------+--------------+   +---------+---------------+---------+-----------+----------+--------------+ LEFT     CompressibilityPhasicitySpontaneityPropertiesThrombus Aging +---------+---------------+---------+-----------+----------+--------------+ CFV      Full           Yes      Yes                                 +---------+---------------+---------+-----------+----------+--------------+ SFJ      Full                                                        +---------+---------------+---------+-----------+----------+--------------+ FV Prox  Full                                                        +---------+---------------+---------+-----------+----------+--------------+ FV Mid   Full                                                        +---------+---------------+---------+-----------+----------+--------------+ FV DistalFull                                                        +---------+---------------+---------+-----------+----------+--------------+ PFV      Full                                                         +---------+---------------+---------+-----------+----------+--------------+ POP      Full           Yes      Yes                                 +---------+---------------+---------+-----------+----------+--------------+  PTV      Full                                                        +---------+---------------+---------+-----------+----------+--------------+ PERO     Full                                                        +---------+---------------+---------+-----------+----------+--------------+     Summary: BILATERAL: - No evidence of deep vein thrombosis seen in the lower extremities, bilaterally. -   *See table(s) above for measurements and observations. Electronically signed by Sherald Hess MD on 02/18/2020 at 5:31:51 PM.    Final

## 2020-03-01 DEATH — deceased

## 2020-03-02 ENCOUNTER — Inpatient Hospital Stay (HOSPITAL_COMMUNITY): Payer: Medicare HMO

## 2020-03-02 LAB — CBC WITH DIFFERENTIAL/PLATELET
Abs Immature Granulocytes: 0.09 10*3/uL — ABNORMAL HIGH (ref 0.00–0.07)
Basophils Absolute: 0 10*3/uL (ref 0.0–0.1)
Basophils Relative: 0 %
Eosinophils Absolute: 0.1 10*3/uL (ref 0.0–0.5)
Eosinophils Relative: 1 %
HCT: 36.4 % (ref 36.0–46.0)
Hemoglobin: 10.8 g/dL — ABNORMAL LOW (ref 12.0–15.0)
Immature Granulocytes: 0 %
Lymphocytes Relative: 2 %
Lymphs Abs: 0.3 10*3/uL — ABNORMAL LOW (ref 0.7–4.0)
MCH: 29.3 pg (ref 26.0–34.0)
MCHC: 29.7 g/dL — ABNORMAL LOW (ref 30.0–36.0)
MCV: 98.9 fL (ref 80.0–100.0)
Monocytes Absolute: 0.7 10*3/uL (ref 0.1–1.0)
Monocytes Relative: 3 %
Neutro Abs: 19.3 10*3/uL — ABNORMAL HIGH (ref 1.7–7.7)
Neutrophils Relative %: 94 %
Platelets: 105 10*3/uL — ABNORMAL LOW (ref 150–400)
RBC: 3.68 MIL/uL — ABNORMAL LOW (ref 3.87–5.11)
RDW: 15 % (ref 11.5–15.5)
WBC: 20.6 10*3/uL — ABNORMAL HIGH (ref 4.0–10.5)
nRBC: 0 % (ref 0.0–0.2)

## 2020-03-02 LAB — PROTIME-INR
INR: 2.6 — ABNORMAL HIGH (ref 0.8–1.2)
Prothrombin Time: 26.9 seconds — ABNORMAL HIGH (ref 11.4–15.2)

## 2020-03-02 LAB — COMPREHENSIVE METABOLIC PANEL
ALT: 10 U/L (ref 0–44)
AST: 14 U/L — ABNORMAL LOW (ref 15–41)
Albumin: 2.5 g/dL — ABNORMAL LOW (ref 3.5–5.0)
Alkaline Phosphatase: 114 U/L (ref 38–126)
Anion gap: 11 (ref 5–15)
BUN: 67 mg/dL — ABNORMAL HIGH (ref 8–23)
CO2: 29 mmol/L (ref 22–32)
Calcium: 9.7 mg/dL (ref 8.9–10.3)
Chloride: 98 mmol/L (ref 98–111)
Creatinine, Ser: 1.91 mg/dL — ABNORMAL HIGH (ref 0.44–1.00)
GFR calc Af Amer: 32 mL/min — ABNORMAL LOW (ref >60)
GFR calc non Af Amer: 27 mL/min — ABNORMAL LOW (ref >60)
Glucose, Bld: 117 mg/dL — ABNORMAL HIGH (ref 70–99)
Potassium: 4.8 mmol/L (ref 3.5–5.1)
Sodium: 138 mmol/L (ref 135–145)
Total Bilirubin: 0.7 mg/dL (ref 0.3–1.2)
Total Protein: 5.4 g/dL — ABNORMAL LOW (ref 6.5–8.1)

## 2020-03-02 LAB — BRAIN NATRIURETIC PEPTIDE: B Natriuretic Peptide: 211 pg/mL — ABNORMAL HIGH (ref 0.0–100.0)

## 2020-03-02 LAB — GLUCOSE, CAPILLARY: Glucose-Capillary: 101 mg/dL — ABNORMAL HIGH (ref 70–99)

## 2020-03-02 LAB — PROCALCITONIN: Procalcitonin: 0.2 ng/mL

## 2020-03-02 LAB — MAGNESIUM: Magnesium: 2.4 mg/dL (ref 1.7–2.4)

## 2020-03-02 MED ORDER — FUROSEMIDE 10 MG/ML IJ SOLN
INTRAMUSCULAR | Status: AC
  Filled 2020-03-02: qty 4

## 2020-03-02 MED ORDER — MORPHINE 100MG IN NS 100ML (1MG/ML) PREMIX INFUSION
0.0000 mg/h | INTRAVENOUS | Status: DC
  Administered 2020-03-02: 5 mg/h via INTRAVENOUS
  Administered 2020-03-02: 20 mg/h via INTRAVENOUS
  Filled 2020-03-02 (×3): qty 100

## 2020-03-02 MED ORDER — LORAZEPAM 2 MG/ML IJ SOLN
2.0000 mg | INTRAMUSCULAR | Status: DC | PRN
  Administered 2020-03-02 (×2): 2 mg via INTRAVENOUS
  Filled 2020-03-02 (×2): qty 1

## 2020-03-02 MED ORDER — MORPHINE SULFATE (PF) 2 MG/ML IV SOLN: 2.0000 mg | INTRAVENOUS | Status: DC | PRN

## 2020-03-02 MED ORDER — GLYCOPYRROLATE 0.2 MG/ML IJ SOLN
0.2000 mg | INTRAMUSCULAR | Status: DC | PRN
  Filled 2020-03-02: qty 1

## 2020-03-02 MED ORDER — GLYCOPYRROLATE 1 MG PO TABS: 1.0000 mg | ORAL_TABLET | ORAL | Status: DC | PRN

## 2020-03-02 MED ORDER — LORAZEPAM 2 MG/ML IJ SOLN: 2.0000 mg | Freq: Once | INTRAMUSCULAR | Status: AC

## 2020-03-02 MED ORDER — MORPHINE SULFATE (PF) 4 MG/ML IV SOLN
INTRAVENOUS | Status: AC
  Administered 2020-03-02: 1 mg
  Filled 2020-03-02: qty 1

## 2020-03-02 MED ORDER — WARFARIN SODIUM 2.5 MG PO TABS: 2.5000 mg | ORAL_TABLET | Freq: Once | ORAL | Status: DC

## 2020-03-02 MED ORDER — FUROSEMIDE 10 MG/ML IJ SOLN
60.0000 mg | Freq: Once | INTRAMUSCULAR | Status: AC
  Administered 2020-03-02: 60 mg via INTRAVENOUS

## 2020-03-02 MED ORDER — ACETAMINOPHEN 650 MG RE SUPP: 650.0000 mg | Freq: Four times a day (QID) | RECTAL | Status: DC | PRN

## 2020-03-02 MED ORDER — ACETAMINOPHEN 325 MG PO TABS: 650.0000 mg | ORAL_TABLET | Freq: Four times a day (QID) | ORAL | Status: DC | PRN

## 2020-03-02 MED ORDER — MORPHINE SULFATE (PF) 2 MG/ML IV SOLN: 1.0000 mg | Freq: Once | INTRAVENOUS | Status: AC

## 2020-03-02 MED ORDER — MORPHINE BOLUS VIA INFUSION
5.0000 mg | INTRAVENOUS | Status: DC | PRN
  Filled 2020-03-02: qty 5

## 2020-03-02 MED ORDER — GLYCOPYRROLATE 0.2 MG/ML IJ SOLN
0.2000 mg | INTRAMUSCULAR | Status: DC | PRN
  Administered 2020-03-02: 0.2 mg via INTRAVENOUS
  Filled 2020-03-02: qty 1

## 2020-03-02 MED ORDER — FUROSEMIDE 10 MG/ML IJ SOLN
INTRAMUSCULAR | Status: AC
  Filled 2020-03-02: qty 2

## 2020-03-02 MED ORDER — DIPHENHYDRAMINE HCL 50 MG/ML IJ SOLN: 25.0000 mg | INTRAMUSCULAR | Status: DC | PRN

## 2020-03-02 MED ORDER — MIDAZOLAM BOLUS VIA INFUSION
2.0000 mg | INTRAVENOUS | Status: DC | PRN
  Filled 2020-03-02: qty 2

## 2020-03-02 MED ORDER — MIDAZOLAM 50MG/50ML (1MG/ML) PREMIX INFUSION
0.0000 mg/h | INTRAVENOUS | Status: DC
  Administered 2020-03-02: 1 mg/h via INTRAVENOUS
  Filled 2020-03-02: qty 50

## 2020-03-02 MED ORDER — DEXTROSE 5 % IV SOLN: INTRAVENOUS | Status: DC

## 2020-03-02 MED ORDER — POLYVINYL ALCOHOL 1.4 % OP SOLN
1.0000 [drp] | Freq: Four times a day (QID) | OPHTHALMIC | Status: DC | PRN
  Filled 2020-03-02: qty 15

## 2020-03-03 DIAGNOSIS — K5900 Constipation, unspecified: Secondary | ICD-10-CM | POA: Clinically undetermined

## 2020-03-03 DIAGNOSIS — Z86711 Personal history of pulmonary embolism: Secondary | ICD-10-CM | POA: Diagnosis present

## 2020-03-03 DIAGNOSIS — D6869 Other thrombophilia: Secondary | ICD-10-CM | POA: Diagnosis present

## 2020-03-03 DIAGNOSIS — R7402 Elevation of levels of lactic acid dehydrogenase (LDH): Secondary | ICD-10-CM | POA: Diagnosis present

## 2020-03-03 DIAGNOSIS — F419 Anxiety disorder, unspecified: Secondary | ICD-10-CM | POA: Diagnosis present

## 2020-03-03 DIAGNOSIS — I4892 Unspecified atrial flutter: Secondary | ICD-10-CM | POA: Diagnosis present

## 2020-03-03 DIAGNOSIS — R0601 Orthopnea: Secondary | ICD-10-CM | POA: Diagnosis present

## 2020-03-03 DIAGNOSIS — Z6841 Body Mass Index (BMI) 40.0 and over, adult: Secondary | ICD-10-CM

## 2020-03-03 DIAGNOSIS — D72819 Decreased white blood cell count, unspecified: Secondary | ICD-10-CM | POA: Diagnosis present

## 2020-03-03 DIAGNOSIS — R7982 Elevated C-reactive protein (CRP): Secondary | ICD-10-CM | POA: Diagnosis present

## 2020-03-03 DIAGNOSIS — Z515 Encounter for palliative care: Secondary | ICD-10-CM

## 2020-03-03 DIAGNOSIS — I700 Atherosclerosis of aorta: Secondary | ICD-10-CM | POA: Diagnosis present

## 2020-03-03 DIAGNOSIS — J90 Pleural effusion, not elsewhere classified: Secondary | ICD-10-CM | POA: Diagnosis present

## 2020-03-03 DIAGNOSIS — Z7189 Other specified counseling: Secondary | ICD-10-CM

## 2020-03-03 DIAGNOSIS — R7989 Other specified abnormal findings of blood chemistry: Secondary | ICD-10-CM | POA: Diagnosis present

## 2020-03-03 DIAGNOSIS — D649 Anemia, unspecified: Secondary | ICD-10-CM | POA: Diagnosis present

## 2020-03-03 DIAGNOSIS — E8809 Other disorders of plasma-protein metabolism, not elsewhere classified: Secondary | ICD-10-CM | POA: Diagnosis present

## 2020-03-03 DIAGNOSIS — R9431 Abnormal electrocardiogram [ECG] [EKG]: Secondary | ICD-10-CM | POA: Diagnosis present

## 2020-03-04 LAB — GLUCOSE, CAPILLARY: Glucose-Capillary: 136 mg/dL — ABNORMAL HIGH (ref 70–99)

## 2020-03-12 DIAGNOSIS — J9621 Acute and chronic respiratory failure with hypoxia: Secondary | ICD-10-CM | POA: Diagnosis not present

## 2020-03-12 DIAGNOSIS — R2689 Other abnormalities of gait and mobility: Secondary | ICD-10-CM | POA: Diagnosis not present

## 2020-03-12 DIAGNOSIS — M6281 Muscle weakness (generalized): Secondary | ICD-10-CM | POA: Diagnosis not present

## 2020-03-15 ENCOUNTER — Ambulatory Visit: Payer: Self-pay

## 2020-03-18 DIAGNOSIS — G4733 Obstructive sleep apnea (adult) (pediatric): Secondary | ICD-10-CM | POA: Diagnosis not present

## 2020-03-26 DIAGNOSIS — G4733 Obstructive sleep apnea (adult) (pediatric): Secondary | ICD-10-CM | POA: Diagnosis not present

## 2020-03-26 DIAGNOSIS — J449 Chronic obstructive pulmonary disease, unspecified: Secondary | ICD-10-CM | POA: Diagnosis not present

## 2020-03-31 NOTE — Progress Notes (Signed)
   20-Mar-2020 1250  Clinical Encounter Type  Visited With Patient and family together  Visit Type Patient actively dying  Referral From Nurse  Consult/Referral To Chaplain  Spiritual Encounters  Spiritual Needs Grief support  Stress Factors  Patient Stress Factors Loss  Family Stress Factors Major life changes;Loss   Chaplain responded to end of life family support. Chaplain sat with Amy Bryant's mother and father for 4 hours as we discussed life review and current events in their family life. Over the 4 hours Amy Bryant remained in a state of visible labored breathing to which the parents would often comment. Chaplain offered ministry of presence, prayer, storytelling, active listening, and hospital navigation. This chaplain is passing off to the night chaplain for further follow up. Chaplains remain available for assistance as needs arise.   Chaplain Resident, Amado Coe, M Div [phone] on-call pager

## 2020-03-31 NOTE — Progress Notes (Signed)
Patient placed on Heated High Flow [place] at 50 L, 100% and a NRB mask at 15L and 100% for patient comfort.  Bipap on standby.

## 2020-03-31 NOTE — Progress Notes (Signed)
   NAME:  Amy Bryant, MRN:  [id number], DOB:  Dec 26, 1955, LOS: 15 ADMISSION DATE:  Mar 13, 2020, CONSULTATION DATE:  5/20 REFERRING MD:  Jerral Ralph, CHIEF COMPLAINT:  Dyspnea   Brief History   64 year old with past medical history including COPD, CKD, chronic respiratory failure presenting with COVID-19 pneumonia on 5/18. Treated with steroids, remdesivir and Actemra. PCCM consulted for worsening hypoxia  Past Medical History  COPD with chronic hypoxic respiratory failure requiring 4 LNCsupplemental oxygen at all times Chronic diastolic congestive heart failure OSA on CPAP at night PAF on Coumadin Prior history of PE CKD stage IIIb Noninsulin-dependent type 2 diabetes Morbid obesity Reactive airway dysfunction  Osteoarthritis  Hypothyroidism GERD  Significant Hospital Events   5/19 Admit 5/20 Transfer to ICU 5/24 Remains on HHF and NRB 5/31 Worsening resp status with desats, tachypnea, made DNR, started BIPAP prn and qHS 6/2 significantly worsening dyspnea/hypoxemia  Consults:  PCCM  Procedures:    Significant Diagnostic Tests:  Chest x-ray 5/20-cardiomegaly, patchy airspace disease, vascular congestion  Echo 5/19-LVEF 60 to 65%, moderate LA, RA dilation  Micro Data:  5/18 sars2: positive 5/18 blood: negative  Antimicrobials:  Solumedrol5/18>> Remdesivir:5/18>> 5/23 Actemra: 5/18 x 1  5/31 cefepime 5/31 vanc > 6/1  Interim history/subjective:   has been on BIPAP since 10pm BIPAP tubing came off this morning briefly, O2 saturation dropped to 40%, severe increased work of breathing since then precedex held due to hypotension and bradycardia  Objective   Blood pressure (!) 99/59, pulse 60, temperature 97.7 F (36.5 C), temperature source Axillary, resp. rate (!) 23, height 5' 5.5" (1.664 m), weight (!) 139.9 kg, SpO2 90 %.    FiO2 (%):  [100 %] 100 %   Intake/Output Summary (Last 24 hours) at 03/16/2020 0758 Last data filed at 03/01/2020 2000 Gross per 24  hour  Intake 720 ml  Output 1300 ml  Net -580 ml   Filed Weights   02/29/20 0500 03/01/20 0500 03/27/2020 0405  Weight: (!) 139.6 kg (!) 139.2 kg (!) 139.9 kg    Examination:  General: marked increased work of breathing HENT: NCAT BIPAP mask in place PULM: wheezing/crackles bilaterally, increased effort, accessory muscle use, can't speak in full sentences CV: RRR, no mgr GI: BS+, soft, nontender MSK: normal bulk and tone Neuro: drowsy but will open eyes and look around to voice  6/1 CXR > severe bilateral airspace opacities  Resolved Hospital Problem list     Assessment & Plan:  Severe ARDS secondary to COVID-19 pneumonia : worsening this week, much worse 6/2 AM Baseline COPD on 4 L oxygen. OSA on CPAP at home Continue BIPAP for now, then try to get back to 15L and NRB so she can eat Morphine now for increased work of breathing CXR now Lasix now If we are unable to get her back to NRB and 15L [place] then she needs to be comfort measures Continue HCAP coverage  CHF Paroxysmal atrial fibrillation on Coumadin anticoagulation as outpatient Tele Lasix as tolerated Cardizem to continue Coumadin per pharmacy  Anxiety precedex on hold due to bradycardia  Continue xanax and prozac  Code DNR  Best practice:   Per [facility]  I tried calling her son, but I ended up reaching another family member Erie Noe) who knows the situation well and will reach out to Al.   Critical care time: 45 minutes    Heber [place], MD [place] [facility] Pager: [phone] Cell: [phone] If no response, call [phone]

## 2020-03-31 NOTE — Progress Notes (Signed)
OT Cancellation Note  Patient Details Name: Amy Bryant MRN: [id number] DOB: June 15, 1956   Cancelled Treatment:     Per chart review, pt deciding to pursue comfort measures. OT orders have been cancelled, thank you for this consult and participating in Mrs. Leisure's care.  Dalphine Handing, MSOT, OTR/L Acute Rehabilitation Services [facility] Office Number: [phone] Pager: [phone]  Dalphine Handing March 06, 2020, 9:48 AM

## 2020-03-31 NOTE — Progress Notes (Addendum)
Patient remained on HHFNC at 50L and 100% FiO2 + 15L NRB. Given several morphine and ativan pushes to help with work of breathing. She is currently on 20 mg/h.  Versed drip was added this afternoon for agonal breathing.   Mom and dad were able to visit outside the room today to be with the patient. They were given walkie talkies to communicate with the patient since they were not able to go inside the room per the parents' doctor's orders.  Patient belongings were given to her parents before they left.   Mom requested to receive a phone call for an update this evening and/or if she passes.

## 2020-03-31 NOTE — Progress Notes (Signed)
   03/06/20 1030  Clinical Encounter Type  Visited With Health care provider;Patient  Visit Type Critical Care  Referral From Nurse  Consult/Referral To Chaplain  Spiritual Encounters  Spiritual Needs Prayer;Emotional  Stress Factors  Patient Stress Factors Health changes  Family Stress Factors Major life changes   Chaplain responded to consult for pt support at end of life. Amy Bryant was noticeably uncomfortable at the time of chaplain's visit. RN and Respiratory are working to [employer] more comfortable. Chaplain offered ministry of presence, encouragement, and prayer.  Chaplain visited for about 30 minutes and left to allow Amy Bryant to rest before friends and family arrive. Chaplain is following this case and is available for support as friends and family arrive throughout the day. Amy Bryant, says she does not want to die, but she is ready in the sense that she knows where she is going when she dies and she is "right with her [name] has concerns for her children and grandchildren in her absence. Every so often Amy Bryant will as the medical staff, "how is my breathing." Chaplain encouraged Amy Bryant to rest and allow the medicines to aid in making her more comfortable and Chaplain will return to visit later.   Chaplain Resident, Amado Coe, M Div [phone] on-call pager

## 2020-03-31 NOTE — Death Summary Note (Signed)
DEATH SUMMARY   Patient Details  Name: Amy Bryant MRN: [id number] DOB: [birth year]  Admission/Discharge Information   Admit Date:  2020-03-15  Date of Death: Date of Death: March 30, 2020  Time of Death: Time of Death: 02/13/2151  Length of Stay: 02/11/2023  Referring Physician: [name], [name], MD   Reason(s) for Hospitalization  Shortness of breath.  Diagnoses  Preliminary cause of death:   Acute on chronic hypoxic respiratory failure due to COVID-19 pneumonia complicated by ARDS in the setting of COPD, acute on chronic diastolic CHF, OSA/OHS and cor pulmonale.  Secondary Diagnoses (including complications and co-morbidities):  Principal Problem:   Acute respiratory distress syndrome (ARDS) due to COVID-19 virus [facility]) Active Problems:   Hypothyroidism   Diabetes type 2, controlled (HCC)   Morbid obesity (HCC)   Obesity hypoventilation syndrome (HCC)   Chronic pulmonary heart disease (HCC)   GERD   Atrial fibrillation (HCC)   OSA (obstructive sleep apnea)   Acute on chronic respiratory failure (HCC)   Acute on chronic diastolic CHF (congestive heart failure) (HCC)   CKD (chronic kidney disease) stage 3, GFR 30-59 ml/min   COPD with acute exacerbation (HCC)   History of pulmonary embolism   Acquired thrombophilia (HCC)   BMI 50.0-59.9, adult (HCC)   Orthopnea   Normocytic anemia   Hypoalbuminemia   Elevated d-dimer   Elevated LDH   High serum fibrinogen   Elevated C-reactive protein (CRP)   Leukopenia   Aortic atherosclerosis (HCC)   Pleural effusion, bilateral   Atrial flutter by electrocardiogram (HCC)   EKG abnormalities   Anxiety and depression   Constipation   Goals of care, counseling/discussion   DNR (do not resuscitate) discussion   Palliative care status   Brief Hospital Course (including significant findings, care, treatment, and services provided and events leading to death)  Amy Bryant is a 64 y.o. year old female with  PMHx of COPD with chronic hypoxic  respiratory failure on 4 L of oxygen at home, chronic diastolic heart failure, PAF on Coumadin, history of PE, CKD stage IIIb, non-insulin-dependent DM-2, morbid obesity complicated by OSA/OHS-who presented to the hospital with worsening shortness of breath, and having symptoms including nasal congestion, upper respiratory infection patient, which she was treated by her PCP with azithromycin course without much relief, and to ED, she was found profoundly hypoxic, requiring nonrebreather bag, admitted initially to hospitalist service, patient tested positive for COVID-19, where she was diagnosed with severe COVID-19 pneumonia, ARDS/acute on chronic hypoxic respiratory failure due to COVID-19 infection (complicated by marked elevation of inflammatory markers), patient oxygen requirement where she was transferred to ICU, where she did require continuous heated high flow nasal cannula and NRB, despite receiving appropriate treatment including IV steroids, IV remdesivir, and receiving Actemra, patient continued to have worsening respiratory status, with severe hypoxia and oxygen requirement, with increased fatigue from increased work of breathing due to her hypoxia which she did require BiPAP in ICU, with increased frequency of use, without much relief, with poor baseline respiratory status in the setting of known history of COPD, which she is oxygen dependent 4 L nasal cannula, and probable OSA/obesity hypoventilatory syndrome, given her morbid obesity, patient with multiple comorbidities which did affect her overall prognosis from COVID-19, including CKD stage III, diastolic CHF, as well with known history of pulmonary embolism in the past, atrial fibrillation/flutter with chads 2 vascular score of 3, diabetes mellitus type 2.  When it became clear that the patient's ARDS was progressing (worseining infiltrates  on serial CXR) and she continued to have high oxygen requirements, a goals of care discussion was held with  the patient. The patient elected to be a DNR CODE STATUS on 02/29/20 as there was no improvement in her hypoxia despite aggressive measures, patient had increased work of breathing, significant air hunger and dyspnea, which she was quite symptomatic, PCCM. discussed with patient and family, and they have established goals of care is for comfort mainly, as patient COVID-19 infection is so severe, and she will not recover from it, so comfort measures were instituted, she was on morphine drip initially, then Versed drip was added later given her significant dyspnea and increased work of breathing, family were able to visit patient before she passed away.   Pertinent Labs and Studies  Significant Diagnostic Studies DG CHEST PORT 1 VIEW  Result Date: 03/25/2020 CLINICAL DATA:  Shortness of breath EXAM: PORTABLE CHEST 1 VIEW COMPARISON:  March 01, 2020 FINDINGS: Widespread airspace opacity is noted throughout the lungs, apparently superimposed on underlying fibrosis. There has been relative partial clearing from the right upper lobe compared to 1 day prior. However, there is new airspace opacity throughout much of the left lung with an overall increase in opacity compared to 1 day prior. Heart is upper normal in size with pulmonary vascularity normal. No adenopathy. There is aortic atherosclerosis. There is degenerative change in the right shoulder. IMPRESSION: Widespread airspace opacity with partial clearing from the right upper lobe extensive increase in opacity throughout much of the left lung. Suspect multifocal pneumonia, although a degree of congestive heart failure, potentially with a degree of underlying ARDS as well may be present. Stable cardiac silhouette with heart upper normal in size. Aortic Atherosclerosis (ICD10-I70.0). Electronically Signed   By: Lowella Grip III M.D.   On: 03/12/2020 09:34   DG CHEST PORT 1 VIEW  Result Date: 03/01/2020 CLINICAL DATA:  Acute respiratory failure. EXAM:  PORTABLE CHEST 1 VIEW COMPARISON:  02/28/2020. FINDINGS: Stable cardiomegaly. Diffuse bilateral pulmonary infiltrates/edema again noted. Small bilateral pleural effusions cannot be excluded. Similar findings noted on prior exam. No pneumothorax. IMPRESSION: Stable cardiomegaly. Diffuse bilateral pulmonary infiltrates/edema again noted. Small bilateral pleural effusions cannot be excluded. Similar findings noted on prior exam. Electronically Signed   By: Jeff   On: 03/01/2020 07:09   DG Chest Port 1 View  Result Date: 02/28/2020 CLINICAL DATA:  Shortness of breath.  COVID-19 positive EXAM: PORTABLE CHEST 1 VIEW COMPARISON:  Feb 26, 2020 FINDINGS: There is apparent underlying fibrosis. There is airspace consolidation throughout the right mid and lower lung zones, a change from 2 days prior. There is a small pleural effusion on each side, stable. There is ill-defined opacity in the lateral left base which has increased from 2 days prior. There is cardiomegaly with a degree of pulmonary venous hypertension. No adenopathy evident. There is aortic atherosclerosis. No bone lesions. IMPRESSION: Multifocal airspace opacity, particularly on the right, a new finding likely representing multifocal pneumonia. A degree of alveolar edema superimposed is possible. Underlying fibrotic change. A degree of interstitial edema superimposed is question. The interstitium appear stable compared to the previous study. Note that there may be a degree of congestive heart failure. There are small pleural effusions bilaterally. Stable cardiomegaly with pulmonary vascular congestion. Aortic Atherosclerosis (ICD10-I70.0). Electronically Signed   By: Lowella Grip III M.D.   On: 02/28/2020 09:20   DG Chest Port 1 View  Result Date: 02/26/2020 CLINICAL DATA:  64 year old female with respiratory failure. Chronic lung  disease. EXAM: PORTABLE CHEST 1 VIEW COMPARISON:  Portable chest 02/23/2020 and earlier. FINDINGS: Portable AP  upright view at 0806 hours. Stable cardiomegaly and mediastinal contours. Visualized tracheal air column is within normal limits. Coarse and confluent bilateral pulmonary opacity, basilar predominant. No superimposed pneumothorax or pleural effusion. Although bilateral lung base ventilation appears mildly worsened since 02/20/2020. No acute osseous abnormality identified. IMPRESSION: Chronic lung disease and cardiomegaly with basilar predominant abnormal pulmonary opacity most suggestive of pneumonia. Mild progression since 02/20/2020. No pleural effusion identified. Electronically Signed   By: Odessa Fleming M.D.   On: 02/26/2020 08:36   DG Chest Port 1 View  Result Date: 02/23/2020 CLINICAL DATA:  COVID pneumonia. EXAM: PORTABLE CHEST 1 VIEW COMPARISON:  One-view chest x-ray 02/20/2020 FINDINGS: Heart is enlarged. Atherosclerotic calcifications are present at the aortic arch. Diffuse interstitial and airspace disease is present. Consolidation at the left base is slightly less dense. A diffuse interstitial pattern has increased some. IMPRESSION: 1. Diffuse interstitial and airspace disease compatible with pneumonia. An element of edema is not excluded. 2. Consolidation at the left base is slightly less dense. Electronically Signed   By: Marin Roberts M.D.   On: 02/23/2020 08:49   DG Chest Port 1 View  Result Date: 02/20/2020 CLINICAL DATA:  Shortness of breath, history of COVID-19 positivity EXAM: PORTABLE CHEST 1 VIEW COMPARISON:  02/17/2020 FINDINGS: Cardiac shadow is stable but enlarged. Aortic calcifications are seen. Patchy opacities are noted primarily in the left base consistent with the given clinical history. Mild vascular congestion is noted centrally. No other focal abnormality is seen. IMPRESSION: Mild vascular congestion. Persistent opacity in the left base consistent with the given clinical history. Electronically Signed   By: Alcide Clever M.D.   On: 02/20/2020 08:29   DG Chest Port 1  View  Result Date: 02/20/2020 CLINICAL DATA:  Dyspnea, fever. EXAM: PORTABLE CHEST 1 VIEW COMPARISON:  Radiograph 10/06/2019 FINDINGS: Significant interval change from prior exam with diffuse interstitial thickening and reticular opacities. There is also perivascular haziness. Cardiomegaly with unchanged mediastinal contours. Aortic atherosclerosis. Possible small pleural effusions. No visualized pneumothorax. IMPRESSION: New diffuse interstitial thickening and reticular opacities suspicious for pulmonary edema. Similar cardiomegaly. Possible small pleural effusions. Findings suspicious for CHF. Electronically Signed   By: Narda Rutherford M.D.   On: 02/23/2020 18:11   DG Chest Port 1V same Day  Result Date: 02/17/2020 CLINICAL DATA:  Shortness of breath EXAM: PORTABLE CHEST 1 VIEW COMPARISON:  Feb 16, 2020 FINDINGS: There is cardiomegaly with pulmonary venous hypertension. There is interstitial edema with patchy airspace opacity in the left base. No adenopathy. There is aortic atherosclerosis. No bone lesions. IMPRESSION: Cardiomegaly with pulmonary vascular congestion. There is interstitial pulmonary edema. Suspect a degree of congestive heart failure. Patchy airspace opacity in the left base is concerning for superimposed pneumonia, although alveolar edema in this area could present in this manner. Both pneumonia and alveolar edema could present concurrently. In comparison with 1 day prior, there appears to be somewhat less interstitial edema. Aortic Atherosclerosis (ICD10-I70.0). Electronically Signed   By: Bretta Bang III M.D.   On: 02/17/2020 08:01   ECHOCARDIOGRAM COMPLETE  Result Date: 02/17/2020    ECHOCARDIOGRAM REPORT   Patient Name:   DONI BACHA Date of Exam: 02/17/2020 Medical Rec #:  [id number]      Height:       65.5 in Accession #:    [id number]     Weight:       290.3 lb Date  of Birth:  1956-05-03      BSA:          2.331 m Patient Age:    63 years       BP:           120/67 mmHg  Patient Gender: F              HR:           75 bpm. Exam Location:  Inpatient Procedure: 2D Echo Indications:    CHF 428  History:        Patient has prior history of Echocardiogram examinations, most                 recent 01/09/2019. CHF, COPD, Arrythmias:Atrial Fibrillation;                 Risk Factors:Diabetes and Former Smoker. Covid +. cor pulmonale.                 pulmonary embolism.  Sonographer:    Celene Skeen RDCS (AE) Referring Phys: [id number] John Giovanni  Sonographer Comments: Patient is morbidly obese. Image acquisition challenging due to patient body habitus and Image acquisition challenging due to respiratory motion. limited mobility IMPRESSIONS  1. Left ventricular ejection fraction, by estimation, is 60 to 65%. The left ventricle has normal function. The left ventricle has no regional wall motion abnormalities. Left ventricular diastolic parameters are indeterminate.  2. Right ventricular systolic function was not well visualized. The right ventricular size is not well visualized.  3. Left atrial size was moderately dilated.  4. Right atrial size was mild to moderately dilated.  5. The mitral valve is normal in structure. No evidence of mitral valve regurgitation. No evidence of mitral stenosis.  6. The aortic valve was not well visualized. Aortic valve regurgitation is trivial. Mild to moderate aortic valve sclerosis/calcification is present, without any evidence of aortic stenosis.  7. The inferior vena cava not well seen. FINDINGS  Left Ventricle: Left ventricular ejection fraction, by estimation, is 60 to 65%. The left ventricle has normal function. The left ventricle has no regional wall motion abnormalities. The left ventricular internal cavity size was normal in size. There is  no left ventricular hypertrophy. Left ventricular diastolic parameters are indeterminate. Right Ventricle: The right ventricular size is not well visualized. Right vetricular wall thickness was not assessed.  Right ventricular systolic function was not well visualized. Left Atrium: Left atrial size was moderately dilated. Right Atrium: Right atrial size was mild to moderately dilated. Pericardium: There is no evidence of pericardial effusion. Mitral Valve: The mitral valve is normal in structure. There is mild thickening of the mitral valve leaflet(s). There is mild calcification of the mitral valve leaflet(s). Normal mobility of the mitral valve leaflets. Moderate mitral annular calcification. No evidence of mitral valve regurgitation. No evidence of mitral valve stenosis. Tricuspid Valve: The tricuspid valve is normal in structure. Tricuspid valve regurgitation is mild . No evidence of tricuspid stenosis. Aortic Valve: The aortic valve was not well visualized. Aortic valve regurgitation is trivial. Mild to moderate aortic valve sclerosis/calcification is present, without any evidence of aortic stenosis. Pulmonic Valve: The pulmonic valve was normal in structure. Pulmonic valve regurgitation is not visualized. No evidence of pulmonic stenosis. Aorta: The aortic root is normal in size and structure. Venous: The inferior vena cava not well seen. IAS/Shunts: The interatrial septum was not well visualized.  LEFT VENTRICLE PLAX 2D LVIDd:  5.30 cm LVIDs:         3.40 cm LV PW:         1.10 cm LV IVS:        1.00 cm LVOT diam:     1.80 cm LV SV:         38 LV SV Index:   16 LVOT Area:     2.54 cm  RIGHT VENTRICLE RV S prime:     14.30 cm/s TAPSE (M-mode): 1.9 cm LEFT ATRIUM           Index       RIGHT ATRIUM           Index LA diam:      4.80 cm 2.06 cm/m  RA Area:     17.70 cm LA Vol (A4C): 44.0 ml 18.88 ml/m RA Volume:   45.20 ml  19.39 ml/m  AORTIC VALVE LVOT Vmax:   83.90 cm/s LVOT Vmean:  59.500 cm/s LVOT VTI:    0.150 m  AORTA Ao Root diam: 3.40 cm MITRAL VALVE MV Area (PHT): 3.48 cm    SHUNTS MV Decel Time: 218 msec    Systemic VTI:  0.15 m MV E velocity: 73.70 cm/s  Systemic Diam: 1.80 cm Charlton Haws MD  Electronically signed by Charlton Haws MD Signature Date/Time: 02/17/2020/3:39:01 PM    Final    VAS [place] LOWER EXTREMITY VENOUS (DVT)  Result Date: 02/18/2020  Lower Venous DVTStudy Indications: Swelling, and Edema.  Comparison Study: no prior Performing Technologist: Blanch Media RVS  Examination Guidelines: A complete evaluation includes B-mode imaging, spectral Doppler, color Doppler, and power Doppler as needed of all accessible portions of each vessel. Bilateral testing is considered an integral part of a complete examination. Limited examinations for reoccurring indications may be performed as noted. The reflux portion of the exam is performed with the patient in reverse Trendelenburg.  +---------+---------------+---------+-----------+----------+--------------+ RIGHT    CompressibilityPhasicitySpontaneityPropertiesThrombus Aging +---------+---------------+---------+-----------+----------+--------------+ CFV      Full           Yes      Yes                                 +---------+---------------+---------+-----------+----------+--------------+ SFJ      Full                                                        +---------+---------------+---------+-----------+----------+--------------+ FV Prox  Full                                                        +---------+---------------+---------+-----------+----------+--------------+ FV Mid   Full                                                        +---------+---------------+---------+-----------+----------+--------------+ FV DistalFull                                                        +---------+---------------+---------+-----------+----------+--------------+  PFV      Full                                                        +---------+---------------+---------+-----------+----------+--------------+ POP      Full           Yes      Yes                                  +---------+---------------+---------+-----------+----------+--------------+ PTV      Full                                                        +---------+---------------+---------+-----------+----------+--------------+ PERO     Full                                                        +---------+---------------+---------+-----------+----------+--------------+   +---------+---------------+---------+-----------+----------+--------------+ LEFT     CompressibilityPhasicitySpontaneityPropertiesThrombus Aging +---------+---------------+---------+-----------+----------+--------------+ CFV      Full           Yes      Yes                                 +---------+---------------+---------+-----------+----------+--------------+ SFJ      Full                                                        +---------+---------------+---------+-----------+----------+--------------+ FV Prox  Full                                                        +---------+---------------+---------+-----------+----------+--------------+ FV Mid   Full                                                        +---------+---------------+---------+-----------+----------+--------------+ FV DistalFull                                                        +---------+---------------+---------+-----------+----------+--------------+ PFV      Full                                                        +---------+---------------+---------+-----------+----------+--------------+  POP      Full           Yes      Yes                                 +---------+---------------+---------+-----------+----------+--------------+ PTV      Full                                                        +---------+---------------+---------+-----------+----------+--------------+ PERO     Full                                                         +---------+---------------+---------+-----------+----------+--------------+     Summary: BILATERAL: - No evidence of deep vein thrombosis seen in the lower extremities, bilaterally. -   *See table(s) above for measurements and observations. Electronically signed by Sherald Hess MD on 02/18/2020 at 5:31:51 PM.    Final     Microbiology Recent Results (from the past 240 hour(s))  MRSA PCR Screening     Status: None   Collection Time: 02/24/20 10:25 AM   Specimen: Nasal Mucosa; Nasopharyngeal  Result Value Ref Range Status   MRSA by PCR NEGATIVE NEGATIVE Final    Comment:        The GeneXpert MRSA Assay (FDA approved for NASAL specimens only), is one component of a comprehensive MRSA colonization surveillance program. It is not intended to diagnose MRSA infection nor to guide or monitor treatment for MRSA infections. Performed at [facility], [address], [place], [place] [address]   Expectorated sputum assessment w rflx to resp cult     Status: None   Collection Time: 02/28/20 10:18 AM   Specimen: Expectorated Sputum  Result Value Ref Range Status   Specimen Description Expect. Sput  Final   Special Requests NONE  Final   Sputum evaluation   Final    THIS SPECIMEN IS ACCEPTABLE FOR SPUTUM CULTURE Performed at [facility], [address] [place], [place] [address]    Report Status 02/28/2020 FINAL  Final  MRSA PCR Screening     Status: None   Collection Time: 02/28/20 10:18 AM   Specimen: Nasopharyngeal  Result Value Ref Range Status   MRSA by PCR NEGATIVE NEGATIVE Final    Comment:        The GeneXpert MRSA Assay (FDA approved for NASAL specimens only), is one component of a comprehensive MRSA colonization surveillance program. It is not intended to diagnose MRSA infection nor to guide or monitor treatment for MRSA infections. Performed at [facility], [address] [place], [place] [address]   Culture, respiratory     Status: None    Collection Time: 02/28/20 10:18 AM  Result Value Ref Range Status   Specimen Description Expect. Sput  Final   Special Requests NONE Reflexed from [id number]  Final   Gram Stain   Final    RARE WBC PRESENT,BOTH PMN AND MONONUCLEAR FEW GRAM POSITIVE COCCI IN PAIRS    Culture   Final    MODERATE Consistent with normal respiratory  flora. Performed at [facility], [address] [place], [place] [address]    Report Status 03/01/2020 FINAL  Final    Lab Basic Metabolic Panel: Recent Labs  Lab 02/27/20 0604 02/28/20 0805 02/29/20 0603 03/01/20 0634 10-Jan-2020 0729  NA 138 136 137 139 138  K 4.1 3.9 4.2 4.0 4.8  CL 102 101 102 101 98  CO2 30 25 28 29 29   GLUCOSE 136* 98 118* 93 117*  BUN 50* 51* 47* 52* 67*  CREATININE 1.78* 1.59* 1.72* 1.62* 1.91*  CALCIUM 9.4 9.5 9.7 9.8 9.7  MG 2.1 2.4 2.3 2.3 2.4   Liver Function Tests: Recent Labs  Lab 02/27/20 0604 02/28/20 0805 02/29/20 0603 03/01/20 0634 10-Jan-2020 0729  AST 11* 12* 13* 13* 14*  ALT 11 10 11 8 10   ALKPHOS 67 81 90 91 114  BILITOT 0.7 0.5 0.2* 0.6 0.7  PROT 5.1* 5.2* 5.1* 5.2* 5.4*  ALBUMIN 2.6* 2.6* 2.5* 2.4* 2.5*   No results for input(s): LIPASE, AMYLASE in the last 168 hours. No results for input(s): AMMONIA in the last 168 hours. CBC: Recent Labs  Lab 02/27/20 0604 02/28/20 0805 02/29/20 0603 03/01/20 0634 10-Jan-2020 0729  WBC 13.9* 16.4* 17.7* 13.6* 20.6*  NEUTROABS 12.0* 14.9* 16.4* 12.4* 19.3*  HGB 10.6* 11.0* 10.4* 9.7* 10.8*  HCT 34.0* 36.3 34.3* 32.3* 36.4  MCV 95.0 96.8 97.7 97.6 98.9  PLT 168 149* 133* 139* 105*   Cardiac Enzymes: No results for input(s): CKTOTAL, CKMB, CKMBINDEX, TROPONINI in the last 168 hours. Sepsis Labs: Recent Labs  Lab 02/28/20 0805 02/29/20 0603 03/01/20 0634 10-Jan-2020 0729  PROCALCITON <0.10 0.13 0.16 0.20  WBC 16.4* 17.7* 13.6* 20.6*    Procedures/Operations     Celisse Ciulla 03/03/2020, 5:27 PM

## 2020-03-31 NOTE — Death Summary Note (Signed)
DEATH SUMMARY   Patient Details  Name: Amy Bryant MRN: [id number] DOB: [birth year]  Admission/Discharge Information   Admit Date:  03-Mar-2020  Date of Death: Date of Death: 03-18-2020  Time of Death: Time of Death: 2151-02-01  Length of Stay: 30-Jan-2023  Referring Physician: [name], [name], MD   Reason(s) for Hospitalization  Shortness of breath.  Diagnoses  Preliminary cause of death:   Acute on chronic hypoxic respiratory failure due to COVID-19 pneumonia complicated by ARDS in the setting of COPD, acute on chronic diastolic CHF, OSA/OHS and cor pulmonale.  Secondary Diagnoses (including complications and co-morbidities):  Principal Problem:   Acute respiratory distress syndrome (ARDS) due to COVID-19 virus [facility]) Active Problems:   Hypothyroidism   Diabetes type 2, controlled (HCC)   Morbid obesity (HCC)   Obesity hypoventilation syndrome (HCC)   Chronic pulmonary heart disease (HCC)   GERD   Atrial fibrillation (HCC)   OSA (obstructive sleep apnea)   Acute on chronic respiratory failure (HCC)   Acute on chronic diastolic CHF (congestive heart failure) (HCC)   CKD (chronic kidney disease) stage 3, GFR 30-59 ml/min   COPD with acute exacerbation (HCC)   History of pulmonary embolism   Acquired thrombophilia (HCC)   BMI 50.0-59.9, adult (HCC)   Orthopnea   Normocytic anemia   Hypoalbuminemia   Elevated d-dimer   Elevated LDH   High serum fibrinogen   Elevated C-reactive protein (CRP)   Leukopenia   Aortic atherosclerosis (HCC)   Pleural effusion, bilateral   Atrial flutter by electrocardiogram (HCC)   EKG abnormalities   Anxiety and depression   Constipation   Goals of care, counseling/discussion   DNR (do not resuscitate) discussion   Palliative care status   Brief Hospital Course (including significant findings, care, treatment, and services provided and events leading to death)  RHONA FUSILIER is a 64 y.o. year old female with  PMHx of COPD with chronic hypoxic  respiratory failure on 4 L of oxygen at home, chronic diastolic heart failure, PAF on Coumadin, history of PE, CKD stage IIIb, non-insulin-dependent DM-2, morbid obesity complicated by OSA/OHS-who presented to the hospital with worsening shortness of breath, and having symptoms including nasal congestion, upper respiratory infection patient, which she was treated by her PCP with azithromycin course without much relief, and to ED, she was found profoundly hypoxic, requiring nonrebreather bag, admitted initially to hospitalist service, patient tested positive for COVID-19, where she was diagnosed with severe COVID-19 pneumonia, ARDS/acute on chronic hypoxic respiratory failure due to COVID-19 infection (complicated by marked elevation of inflammatory markers), patient oxygen requirement where she was transferred to ICU, where she did require continuous heated high flow nasal cannula and NRB, despite receiving appropriate treatment including IV steroids, IV remdesivir, and receiving Actemra, patient continued to have worsening respiratory status, with severe hypoxia and oxygen requirement, with increased fatigue from increased work of breathing due to her hypoxia which she did require BiPAP in ICU, with increased frequency of use, without much relief, with poor baseline respiratory status in the setting of known history of COPD, which she is oxygen dependent 4 L nasal cannula, and probable OSA/obesity hypoventilatory syndrome, given her morbid obesity, patient with multiple comorbidities which did affect her overall prognosis from COVID-19, including CKD stage III, diastolic CHF, as well with known history of pulmonary embolism in the past, atrial fibrillation/flutter with chads 2 vascular score of 3, diabetes mellitus type 2.  When it became clear that the patient's ARDS was progressing (worseining infiltrates  on serial CXR) and she continued to have high oxygen requirements, a goals of care discussion was held with  the patient. The patient elected to be a DNR CODE STATUS on 02/29/20 as there was no improvement in her hypoxia despite aggressive measures, patient had increased work of breathing, significant air hunger and dyspnea, which she was quite symptomatic, PCCM. discussed with patient and family, and they have established goals of care is for comfort mainly, as patient COVID-19 infection is so severe, and she will not recover from it, so comfort measures were instituted, she was on morphine drip initially, then Versed drip was added later given her significant dyspnea and increased work of breathing, family were able to visit patient before she passed away.   Pertinent Labs and Studies  Significant Diagnostic Studies DG CHEST PORT 1 VIEW  Result Date: 03/25/2020 CLINICAL DATA:  Shortness of breath EXAM: PORTABLE CHEST 1 VIEW COMPARISON:  March 01, 2020 FINDINGS: Widespread airspace opacity is noted throughout the lungs, apparently superimposed on underlying fibrosis. There has been relative partial clearing from the right upper lobe compared to 1 day prior. However, there is new airspace opacity throughout much of the left lung with an overall increase in opacity compared to 1 day prior. Heart is upper normal in size with pulmonary vascularity normal. No adenopathy. There is aortic atherosclerosis. There is degenerative change in the right shoulder. IMPRESSION: Widespread airspace opacity with partial clearing from the right upper lobe extensive increase in opacity throughout much of the left lung. Suspect multifocal pneumonia, although a degree of congestive heart failure, potentially with a degree of underlying ARDS as well may be present. Stable cardiac silhouette with heart upper normal in size. Aortic Atherosclerosis (ICD10-I70.0). Electronically Signed   By: Lowella Grip III M.D.   On: 03/12/2020 09:34   DG CHEST PORT 1 VIEW  Result Date: 03/01/2020 CLINICAL DATA:  Acute respiratory failure. EXAM:  PORTABLE CHEST 1 VIEW COMPARISON:  02/28/2020. FINDINGS: Stable cardiomegaly. Diffuse bilateral pulmonary infiltrates/edema again noted. Small bilateral pleural effusions cannot be excluded. Similar findings noted on prior exam. No pneumothorax. IMPRESSION: Stable cardiomegaly. Diffuse bilateral pulmonary infiltrates/edema again noted. Small bilateral pleural effusions cannot be excluded. Similar findings noted on prior exam. Electronically Signed   By: Jeff   On: 03/01/2020 07:09   DG Chest Port 1 View  Result Date: 02/28/2020 CLINICAL DATA:  Shortness of breath.  COVID-19 positive EXAM: PORTABLE CHEST 1 VIEW COMPARISON:  Feb 26, 2020 FINDINGS: There is apparent underlying fibrosis. There is airspace consolidation throughout the right mid and lower lung zones, a change from 2 days prior. There is a small pleural effusion on each side, stable. There is ill-defined opacity in the lateral left base which has increased from 2 days prior. There is cardiomegaly with a degree of pulmonary venous hypertension. No adenopathy evident. There is aortic atherosclerosis. No bone lesions. IMPRESSION: Multifocal airspace opacity, particularly on the right, a new finding likely representing multifocal pneumonia. A degree of alveolar edema superimposed is possible. Underlying fibrotic change. A degree of interstitial edema superimposed is question. The interstitium appear stable compared to the previous study. Note that there may be a degree of congestive heart failure. There are small pleural effusions bilaterally. Stable cardiomegaly with pulmonary vascular congestion. Aortic Atherosclerosis (ICD10-I70.0). Electronically Signed   By: Lowella Grip III M.D.   On: 02/28/2020 09:20   DG Chest Port 1 View  Result Date: 02/26/2020 CLINICAL DATA:  64 year old female with respiratory failure. Chronic lung  disease. EXAM: PORTABLE CHEST 1 VIEW COMPARISON:  Portable chest 02/23/2020 and earlier. FINDINGS: Portable AP  upright view at 0806 hours. Stable cardiomegaly and mediastinal contours. Visualized tracheal air column is within normal limits. Coarse and confluent bilateral pulmonary opacity, basilar predominant. No superimposed pneumothorax or pleural effusion. Although bilateral lung base ventilation appears mildly worsened since 02/20/2020. No acute osseous abnormality identified. IMPRESSION: Chronic lung disease and cardiomegaly with basilar predominant abnormal pulmonary opacity most suggestive of pneumonia. Mild progression since 02/20/2020. No pleural effusion identified. Electronically Signed   By: Odessa Fleming M.D.   On: 02/26/2020 08:36   DG Chest Port 1 View  Result Date: 02/23/2020 CLINICAL DATA:  COVID pneumonia. EXAM: PORTABLE CHEST 1 VIEW COMPARISON:  One-view chest x-ray 02/20/2020 FINDINGS: Heart is enlarged. Atherosclerotic calcifications are present at the aortic arch. Diffuse interstitial and airspace disease is present. Consolidation at the left base is slightly less dense. A diffuse interstitial pattern has increased some. IMPRESSION: 1. Diffuse interstitial and airspace disease compatible with pneumonia. An element of edema is not excluded. 2. Consolidation at the left base is slightly less dense. Electronically Signed   By: Marin Roberts M.D.   On: 02/23/2020 08:49   DG Chest Port 1 View  Result Date: 02/20/2020 CLINICAL DATA:  Shortness of breath, history of COVID-19 positivity EXAM: PORTABLE CHEST 1 VIEW COMPARISON:  02/17/2020 FINDINGS: Cardiac shadow is stable but enlarged. Aortic calcifications are seen. Patchy opacities are noted primarily in the left base consistent with the given clinical history. Mild vascular congestion is noted centrally. No other focal abnormality is seen. IMPRESSION: Mild vascular congestion. Persistent opacity in the left base consistent with the given clinical history. Electronically Signed   By: Alcide Clever M.D.   On: 02/20/2020 08:29   DG Chest Port 1  View  Result Date: 02/20/2020 CLINICAL DATA:  Dyspnea, fever. EXAM: PORTABLE CHEST 1 VIEW COMPARISON:  Radiograph 10/06/2019 FINDINGS: Significant interval change from prior exam with diffuse interstitial thickening and reticular opacities. There is also perivascular haziness. Cardiomegaly with unchanged mediastinal contours. Aortic atherosclerosis. Possible small pleural effusions. No visualized pneumothorax. IMPRESSION: New diffuse interstitial thickening and reticular opacities suspicious for pulmonary edema. Similar cardiomegaly. Possible small pleural effusions. Findings suspicious for CHF. Electronically Signed   By: Narda Rutherford M.D.   On: 02/23/2020 18:11   DG Chest Port 1V same Day  Result Date: 02/17/2020 CLINICAL DATA:  Shortness of breath EXAM: PORTABLE CHEST 1 VIEW COMPARISON:  Feb 16, 2020 FINDINGS: There is cardiomegaly with pulmonary venous hypertension. There is interstitial edema with patchy airspace opacity in the left base. No adenopathy. There is aortic atherosclerosis. No bone lesions. IMPRESSION: Cardiomegaly with pulmonary vascular congestion. There is interstitial pulmonary edema. Suspect a degree of congestive heart failure. Patchy airspace opacity in the left base is concerning for superimposed pneumonia, although alveolar edema in this area could present in this manner. Both pneumonia and alveolar edema could present concurrently. In comparison with 1 day prior, there appears to be somewhat less interstitial edema. Aortic Atherosclerosis (ICD10-I70.0). Electronically Signed   By: Bretta Bang III M.D.   On: 02/17/2020 08:01   ECHOCARDIOGRAM COMPLETE  Result Date: 02/17/2020    ECHOCARDIOGRAM REPORT   Patient Name:   DONI BACHA Date of Exam: 02/17/2020 Medical Rec #:  [id number]      Height:       65.5 in Accession #:    [id number]     Weight:       290.3 lb Date  of Birth:  1956-05-03      BSA:          2.331 m Patient Age:    63 years       BP:           120/67 mmHg  Patient Gender: F              HR:           75 bpm. Exam Location:  Inpatient Procedure: 2D Echo Indications:    CHF 428  History:        Patient has prior history of Echocardiogram examinations, most                 recent 01/09/2019. CHF, COPD, Arrythmias:Atrial Fibrillation;                 Risk Factors:Diabetes and Former Smoker. Covid +. cor pulmonale.                 pulmonary embolism.  Sonographer:    Celene Skeen RDCS (AE) Referring Phys: [id number] John Giovanni  Sonographer Comments: Patient is morbidly obese. Image acquisition challenging due to patient body habitus and Image acquisition challenging due to respiratory motion. limited mobility IMPRESSIONS  1. Left ventricular ejection fraction, by estimation, is 60 to 65%. The left ventricle has normal function. The left ventricle has no regional wall motion abnormalities. Left ventricular diastolic parameters are indeterminate.  2. Right ventricular systolic function was not well visualized. The right ventricular size is not well visualized.  3. Left atrial size was moderately dilated.  4. Right atrial size was mild to moderately dilated.  5. The mitral valve is normal in structure. No evidence of mitral valve regurgitation. No evidence of mitral stenosis.  6. The aortic valve was not well visualized. Aortic valve regurgitation is trivial. Mild to moderate aortic valve sclerosis/calcification is present, without any evidence of aortic stenosis.  7. The inferior vena cava not well seen. FINDINGS  Left Ventricle: Left ventricular ejection fraction, by estimation, is 60 to 65%. The left ventricle has normal function. The left ventricle has no regional wall motion abnormalities. The left ventricular internal cavity size was normal in size. There is  no left ventricular hypertrophy. Left ventricular diastolic parameters are indeterminate. Right Ventricle: The right ventricular size is not well visualized. Right vetricular wall thickness was not assessed.  Right ventricular systolic function was not well visualized. Left Atrium: Left atrial size was moderately dilated. Right Atrium: Right atrial size was mild to moderately dilated. Pericardium: There is no evidence of pericardial effusion. Mitral Valve: The mitral valve is normal in structure. There is mild thickening of the mitral valve leaflet(s). There is mild calcification of the mitral valve leaflet(s). Normal mobility of the mitral valve leaflets. Moderate mitral annular calcification. No evidence of mitral valve regurgitation. No evidence of mitral valve stenosis. Tricuspid Valve: The tricuspid valve is normal in structure. Tricuspid valve regurgitation is mild . No evidence of tricuspid stenosis. Aortic Valve: The aortic valve was not well visualized. Aortic valve regurgitation is trivial. Mild to moderate aortic valve sclerosis/calcification is present, without any evidence of aortic stenosis. Pulmonic Valve: The pulmonic valve was normal in structure. Pulmonic valve regurgitation is not visualized. No evidence of pulmonic stenosis. Aorta: The aortic root is normal in size and structure. Venous: The inferior vena cava not well seen. IAS/Shunts: The interatrial septum was not well visualized.  LEFT VENTRICLE PLAX 2D LVIDd:  5.30 cm LVIDs:         3.40 cm LV PW:         1.10 cm LV IVS:        1.00 cm LVOT diam:     1.80 cm LV SV:         38 LV SV Index:   16 LVOT Area:     2.54 cm  RIGHT VENTRICLE RV S prime:     14.30 cm/s TAPSE (M-mode): 1.9 cm LEFT ATRIUM           Index       RIGHT ATRIUM           Index LA diam:      4.80 cm 2.06 cm/m  RA Area:     17.70 cm LA Vol (A4C): 44.0 ml 18.88 ml/m RA Volume:   45.20 ml  19.39 ml/m  AORTIC VALVE LVOT Vmax:   83.90 cm/s LVOT Vmean:  59.500 cm/s LVOT VTI:    0.150 m  AORTA Ao Root diam: 3.40 cm MITRAL VALVE MV Area (PHT): 3.48 cm    SHUNTS MV Decel Time: 218 msec    Systemic VTI:  0.15 m MV E velocity: 73.70 cm/s  Systemic Diam: 1.80 cm Charlton Haws MD  Electronically signed by Charlton Haws MD Signature Date/Time: 02/17/2020/3:39:01 PM    Final    VAS [place] LOWER EXTREMITY VENOUS (DVT)  Result Date: 02/18/2020  Lower Venous DVTStudy Indications: Swelling, and Edema.  Comparison Study: no prior Performing Technologist: Blanch Media RVS  Examination Guidelines: A complete evaluation includes B-mode imaging, spectral Doppler, color Doppler, and power Doppler as needed of all accessible portions of each vessel. Bilateral testing is considered an integral part of a complete examination. Limited examinations for reoccurring indications may be performed as noted. The reflux portion of the exam is performed with the patient in reverse Trendelenburg.  +---------+---------------+---------+-----------+----------+--------------+ RIGHT    CompressibilityPhasicitySpontaneityPropertiesThrombus Aging +---------+---------------+---------+-----------+----------+--------------+ CFV      Full           Yes      Yes                                 +---------+---------------+---------+-----------+----------+--------------+ SFJ      Full                                                        +---------+---------------+---------+-----------+----------+--------------+ FV Prox  Full                                                        +---------+---------------+---------+-----------+----------+--------------+ FV Mid   Full                                                        +---------+---------------+---------+-----------+----------+--------------+ FV DistalFull                                                        +---------+---------------+---------+-----------+----------+--------------+  PFV      Full                                                        +---------+---------------+---------+-----------+----------+--------------+ POP      Full           Yes      Yes                                  +---------+---------------+---------+-----------+----------+--------------+ PTV      Full                                                        +---------+---------------+---------+-----------+----------+--------------+ PERO     Full                                                        +---------+---------------+---------+-----------+----------+--------------+   +---------+---------------+---------+-----------+----------+--------------+ LEFT     CompressibilityPhasicitySpontaneityPropertiesThrombus Aging +---------+---------------+---------+-----------+----------+--------------+ CFV      Full           Yes      Yes                                 +---------+---------------+---------+-----------+----------+--------------+ SFJ      Full                                                        +---------+---------------+---------+-----------+----------+--------------+ FV Prox  Full                                                        +---------+---------------+---------+-----------+----------+--------------+ FV Mid   Full                                                        +---------+---------------+---------+-----------+----------+--------------+ FV DistalFull                                                        +---------+---------------+---------+-----------+----------+--------------+ PFV      Full                                                        +---------+---------------+---------+-----------+----------+--------------+  POP      Full           Yes      Yes                                 +---------+---------------+---------+-----------+----------+--------------+ PTV      Full                                                        +---------+---------------+---------+-----------+----------+--------------+ PERO     Full                                                         +---------+---------------+---------+-----------+----------+--------------+     Summary: BILATERAL: - No evidence of deep vein thrombosis seen in the lower extremities, bilaterally. -   *See table(s) above for measurements and observations. Electronically signed by Sherald Hess MD on 02/18/2020 at 5:31:51 PM.    Final     Microbiology Recent Results (from the past 240 hour(s))  MRSA PCR Screening     Status: None   Collection Time: 02/24/20 10:25 AM   Specimen: Nasal Mucosa; Nasopharyngeal  Result Value Ref Range Status   MRSA by PCR NEGATIVE NEGATIVE Final    Comment:        The GeneXpert MRSA Assay (FDA approved for NASAL specimens only), is one component of a comprehensive MRSA colonization surveillance program. It is not intended to diagnose MRSA infection nor to guide or monitor treatment for MRSA infections. Performed at [facility], [address], [place], [place] [address]   Expectorated sputum assessment w rflx to resp cult     Status: None   Collection Time: 02/28/20 10:18 AM   Specimen: Expectorated Sputum  Result Value Ref Range Status   Specimen Description Expect. Sput  Final   Special Requests NONE  Final   Sputum evaluation   Final    THIS SPECIMEN IS ACCEPTABLE FOR SPUTUM CULTURE Performed at [facility], [address] [place], [place] [address]    Report Status 02/28/2020 FINAL  Final  MRSA PCR Screening     Status: None   Collection Time: 02/28/20 10:18 AM   Specimen: Nasopharyngeal  Result Value Ref Range Status   MRSA by PCR NEGATIVE NEGATIVE Final    Comment:        The GeneXpert MRSA Assay (FDA approved for NASAL specimens only), is one component of a comprehensive MRSA colonization surveillance program. It is not intended to diagnose MRSA infection nor to guide or monitor treatment for MRSA infections. Performed at [facility], [address] [place], [place] [address]   Culture, respiratory     Status: None    Collection Time: 02/28/20 10:18 AM  Result Value Ref Range Status   Specimen Description Expect. Sput  Final   Special Requests NONE Reflexed from [id number]  Final   Gram Stain   Final    RARE WBC PRESENT,BOTH PMN AND MONONUCLEAR FEW GRAM POSITIVE COCCI IN PAIRS    Culture   Final    MODERATE Consistent with normal respiratory  flora. Performed at [facility], [address] [place], [place] [address]    Report Status 03/01/2020 FINAL  Final    Lab Basic Metabolic Panel: Recent Labs  Lab 02/27/20 0604 02/28/20 0805 02/29/20 0603 03/01/20 0634 03/07/2020 0729  NA 138 136 137 139 138  K 4.1 3.9 4.2 4.0 4.8  CL 102 101 102 101 98  CO2 30 25 28 29 29   GLUCOSE 136* 98 118* 93 117*  BUN 50* 51* 47* 52* 67*  CREATININE 1.78* 1.59* 1.72* 1.62* 1.91*  CALCIUM 9.4 9.5 9.7 9.8 9.7  MG 2.1 2.4 2.3 2.3 2.4   Liver Function Tests: Recent Labs  Lab 02/27/20 0604 02/28/20 0805 02/29/20 0603 03/01/20 0634 03/18/2020 0729  AST 11* 12* 13* 13* 14*  ALT 11 10 11 8 10   ALKPHOS 67 81 90 91 114  BILITOT 0.7 0.5 0.2* 0.6 0.7  PROT 5.1* 5.2* 5.1* 5.2* 5.4*  ALBUMIN 2.6* 2.6* 2.5* 2.4* 2.5*   No results for input(s): LIPASE, AMYLASE in the last 168 hours. No results for input(s): AMMONIA in the last 168 hours. CBC: Recent Labs  Lab 02/27/20 0604 02/28/20 0805 02/29/20 0603 03/01/20 0634 03/21/2020 0729  WBC 13.9* 16.4* 17.7* 13.6* 20.6*  NEUTROABS 12.0* 14.9* 16.4* 12.4* 19.3*  HGB 10.6* 11.0* 10.4* 9.7* 10.8*  HCT 34.0* 36.3 34.3* 32.3* 36.4  MCV 95.0 96.8 97.7 97.6 98.9  PLT 168 149* 133* 139* 105*   Cardiac Enzymes: No results for input(s): CKTOTAL, CKMB, CKMBINDEX, TROPONINI in the last 168 hours. Sepsis Labs: Recent Labs  Lab 02/28/20 0805 02/29/20 0603 03/01/20 0634 03/03/2020 0729  PROCALCITON <0.10 0.13 0.16 0.20  WBC 16.4* 17.7* 13.6* 20.6*    Procedures/Operations    Brenlee Koskela MD 03/03/2020 5:31 pm

## 2020-03-31 NOTE — Progress Notes (Signed)
[facility] PCCM  I spoke to her son Al, he understands his mother may die today.  Heber Avon, MD [facility] P[facility]M Pager: [phone] Cell: [phone] If no response, call [phone]

## 2020-03-31 NOTE — Progress Notes (Addendum)
PROGRESS NOTE                                                                                                                                                                                                             Patient Demographics:    Amy Bryant, is a 64 y.o. female, DOB - Aug 23, 1956, ZOX:[id number]  Outpatient Primary MD for the patient is [name], [name], MD   Admit date - 03/11/2020   LOS - 15  CC - SOB     Brief Narrative:  Patient is a 64 y.o. female with PMHx of COPD with chronic hypoxic respiratory failure on 4 L of oxygen at home, chronic diastolic heart failure, PAF on Coumadin, history of PE, CKD stage IIIb, non-insulin-dependent DM-2, morbid obesity-who presented to the hospital with approximately 2-3-day history of worsening shortness of breath.  For approximately 1 week-she has been having nasal congestion/URI symptoms-her PCP called in prednisone and Zithromax which she took without any relief.  In the emergency room-she was noted to have severe hypoxemia-requiring NRB-she was subsequently admitted to the hospitalist service, patient did not receive her Covid vaccine, so she has severe COVID-19 with respiratory failure/ARDS,  patient continues to have significant oxygen requirement during hospital stay, which she was persistently on high flow nasal cannula and NRB, patient started to develop some some fatigue from her increased work of breathing, which she was started on BiPAP as well , even with maximum BiPAP support, she continues to have some hypoxic episodes, this morning include significant deterioration of respiratory status, despite being on BiPAP, she was unable to be transitioned back to NRB and heated high flow, patient appears to be in significant amount of discomfort, and dyspnea, TCM discussed with patient and family, where she has been transitioned to comfort care .  Significant Events: 5/18>> admit  to [facility] for severe hypoxemia requiring nonrebreather mask. 5/19>> on heated high flow 5/20>> on heated high flow+ NRB-transfer to MICU  COVID-19 medications: Steroids: 5/18>> Remdesivir: 5/18>>5/22 Actemra: 5/18 x 1  Microbiology data: 5/18: Blood culture>> no growth  Significant studies: 5/20: Bilateral lower extremity Doppler>> negative for DVT. 5/19: Echo>> EF 60-65% 5/19: Chest x-ray>> interstitial pulmonary edema/patchy airspace opacity in the left base 5/18: Chest x-ray>> new diffuse interstitial thickening/reticular opacities suspicious for pulmonary edema  DVT prophylaxis: Coumadin  Procedures: None  Consults:  PCCM    Subjective:   Patient in bed, on BiPAP, significantly uncomfortable, BiPAP tubing came off briefly this morning, where she had oxygen saturation of 40%, clean her saturation 91% on BiPAP, but she does appear to be uncomfortable.    Assessment  & Plan :   Acute on chronic hypoxic Resp Failure/ARDS due to near Covid 19 Viral pneumonia and decompensated diastolic heart failure:  - She had severe disease and likely presented once her parenchymal damage was quite advanced due to COVID-19 pneumonia and inflammation, received appropriate care with IV steroids, remdesivir and Actemra.  She has finished her steroid course along with IV Actemra and remdesivir. She has incurred severe parenchymal lung injury from COVID-19 pneumonia, she continues to have significant oxygen requirement, on heated high flow and NRB as well, over the last 48 hours she did require some BiPAP support as well, nightly and as needed, this morning she appears to be significantly comfortable, struggling to breathe with air hunger, PCCM has discussed with family/patient, patient has been made to proceed with comfort measures, which seems to be the most appropriate at this point, given her  severe ARDS from COVID-19 pneumonia, with very poor baseline lung disease on 4 L nasal cannula at baseline,  with known history of COPD, CHF, pulmonary embolism and OSA. -Comfort measures has been instituted by PCCM, she is currently on morphine drip.   O2 requirements:  SpO2: (!) 80 % O2 Flow Rate (L/min): 50 L/min(+ 15L NRB.) FiO2 (%): 100 %   COVID-19 Labs: No results for input(s): DDIMER, FERRITIN, LDH, CRP in the last 72 hours.     Component Value Date/Time   BNP 211.0 (H) 03/30/2020 0729    Recent Labs  Lab 02/28/20 0805 02/29/20 0603 03/01/20 0634 03/11/2020 0729  PROCALCITON <0.10 0.13 0.16 0.20    Lab Results  Component Value Date   SARSCOV2NAA POSITIVE (A) 03/17/2020   SARSCOV2NAA NEGATIVE 10/06/2019   SARSCOV2NAA NOT DETECTED 04/08/2019   SARSCOV2NAA NOT DETECTED 04/02/2019    Decompensated acute on chronic diastolic heart failure EF 60%:  -He has been on IV Lasix as needed .  Elevated D-dimer:  - On chronic Coumadin, INR in good range and D-dimer now trending down, lower extremity venous duplex unremarkable.  COPD:  -Not in exacerbation this morning-no wheezing-decrease steroids to twice daily dosing.  Continue bronchodilators.  Normally on 4 L of oxygen at all times at home.  AKI on CKD stage IIIb: Baseline creatinine around 1.8, monitor cautiously with diuresis, [place] close to baseline.  PAF with [place] vas 2 score of at least 3 - goal is rate control, blood pressure slightly soft hence Cardizem dose dropped further on 02/27/2020, pharmacy monitoring Coumadin and INR.  History of pulmonary embolism: on Coumadin pharmacy following.  Hypothyroidism: - Synthroid  Depression/anxiety:  -  as needed Xanax and Prozac.  DM-2 (A1c 5.2 on 10/07/2019):  - on ISS   Recent Labs    03/01/20 1703 03/01/20 2020 03/19/2020 0745  GLUCAP 116* 134* 101*   Morbid Obesity: BMI of 48 follow with PCP for weight loss.    GI prophylaxis: PPI  Condition -with expected during hospital stay  Family Communication  : Family has been updated by PCCM today.  I have discussed  with father and mother side her room as well  Code Status : DNR/comfort  Diet :  Diet Order    None       Disposition Plan  :   Status is: Inpatient  Remains inpatient appropriate because:Inpatient level of care appropriate due to severity of illness   Dispo: The patient is from: Home              Anticipated d/c is to: hospital death              Anticipated d/c date is: 1 day              Patient currently is not medically stable to d/c.  Patient currently is comfort care, she is requiring hospital management for comfort care measures, especially with her respiratory status.  Barriers to discharge: Severe Hypoxia requiring +++ O2 supplementation  Antimicorbials  :    Anti-infectives (From admission, onward)   Start     Dose/Rate Route Frequency Ordered Stop   March 05, 2020 1800  vancomycin (VANCOREADY) IVPB 2000 mg/400 mL  Status:  Discontinued     2,000 mg 200 mL/hr over 120 Minutes Intravenous Every 48 hours 02/29/20 1729 03/01/20 0916   02/29/20 1730  vancomycin (VANCOCIN) 2,500 mg in sodium chloride 0.9 % 500 mL IVPB     2,500 mg 250 mL/hr over 120 Minutes Intravenous  Once 02/29/20 1729 02/29/20 2254   02/29/20 1715  ceFEPIme (MAXIPIME) 2 g in sodium chloride 0.9 % 100 mL IVPB  Status:  Discontinued     2 g 200 mL/hr over 30 Minutes Intravenous Every 12 hours 02/29/20 1714 2020-03-05 0927   02/29/20 0715  azithromycin (ZITHROMAX) tablet 500 mg  Status:  Discontinued     500 mg Oral Daily 02/29/20 0713 2020/03/05 0927   02/17/20 1000  remdesivir 100 mg in sodium chloride 0.9 % 100 mL IVPB     100 mg 200 mL/hr over 30 Minutes Intravenous Daily 02/08/2020 2202 02/20/20 0955   02/11/2020 2215  remdesivir 200 mg in sodium chloride 0.9% 250 mL IVPB     200 mg 580 mL/hr over 30 Minutes Intravenous Once 02/05/2020 2202 02/07/2020 2319      Inpatient Medications  Scheduled Meds: . Chlorhexidine Gluconate Cloth  6 each Topical Daily  . insulin detemir  8 Units Subcutaneous Daily    Continuous Infusions: . dexmedetomidine (PRECEDEX) IV infusion 0.4 mcg/kg/hr (03/01/20 1255)  . dextrose    . morphine 5 mg/hr (03-05-20 1018)   PRN Meds:.acetaminophen **OR** acetaminophen, albuterol, ALPRAZolam, ALPRAZolam, chlorpheniramine-HYDROcodone, diphenhydrAMINE, glycopyrrolate **OR** glycopyrrolate **OR** glycopyrrolate, LORazepam, morphine injection, morphine, polyvinyl alcohol   Time Spent in minutes  35    See all Orders from today for further details   Huey Bienenstock M.D on 03/05/20 at 11:54 AM  To page go to [email or web address] - use universal password  [facility] -  Office  [phone]    Objective:   Vitals:   03/05/2020 0757 Mar 05, 2020 0800 Mar 05, 2020 1000 05-Mar-2020 1037  BP: 99/72 121/73 (!) 118/99   Pulse: 77 72 75 75  Resp: (!) 24 18 (!) 25 (!) 22  Temp:      TempSrc:      SpO2: (!) 80% (!) 84% (!) 82% (!) 80%  Weight:      Height:        Wt Readings from Last 3 Encounters:  03-05-20 (!) 139.9 kg  01/28/20 132 kg  01/06/20 132 kg     Intake/Output Summary (Last 24 hours) at 05-Mar-2020 1154 Last data filed at 03/01/2020 2000 Gross per 24 hour  Intake 480 ml  Output 1100 ml  Net -620 ml     Physical Exam  Patient is lethargic this morning, but open eye,  with significantly increased work of breathing and discomfort.  BiPAP mask in place Increased work of breathing, tachypneic, with the use of accessory muscles, has scattered wheezing and crackles bilaterally. Regular rate and rhythm, no rubs or gallops soft, nontender, nondistended, bowel sounds present Extremities with no edema, clubbing or cyanosis    Data Review:    CBC Recent Labs  Lab 02/27/20 0604 02/28/20 0805 02/29/20 0603 03/01/20 0634 03/14/2020 0729  WBC 13.9* 16.4* 17.7* 13.6* 20.6*  HGB 10.6* 11.0* 10.4* 9.7* 10.8*  HCT 34.0* 36.3 34.3* 32.3* 36.4  PLT 168 149* 133* 139* 105*  MCV 95.0 96.8 97.7 97.6 98.9  MCH 29.6 29.3 29.6 29.3 29.3  MCHC 31.2 30.3 30.3 30.0 29.7*   RDW 14.2 14.6 14.6 14.8 15.0  LYMPHSABS 1.0 0.7 0.6* 0.6* 0.3*  MONOABS 0.6 0.5 0.5 0.4 0.7  EOSABS 0.2 0.1 0.2 0.2 0.1  BASOSABS 0.0 0.0 0.0 0.0 0.0    Chemistries  Recent Labs  Lab 02/27/20 0604 02/28/20 0805 02/29/20 0603 03/01/20 0634 03/10/2020 0729  NA 138 136 137 139 138  K 4.1 3.9 4.2 4.0 4.8  CL 102 101 102 101 98  CO2 30 25 28 29 29   GLUCOSE 136* 98 118* 93 117*  BUN 50* 51* 47* 52* 67*  CREATININE 1.78* 1.59* 1.72* 1.62* 1.91*  CALCIUM 9.4 9.5 9.7 9.8 9.7  MG 2.1 2.4 2.3 2.3 2.4  AST 11* 12* 13* 13* 14*  ALT 11 10 11 8 10   ALKPHOS 67 81 90 91 114  BILITOT 0.7 0.5 0.2* 0.6 0.7   ------------------------------------------------------------------------------------------------------------------ No results for input(s): CHOL, HDL, LDLCALC, TRIG, CHOLHDL, LDLDIRECT in the last 72 hours.  Lab Results  Component Value Date   HGBA1C 5.8 (H) 02/17/2020   ------------------------------------------------------------------------------------------------------------------ No results for input(s): TSH, T4TOTAL, T3FREE, THYROIDAB in the last 72 hours.  Invalid input(s): FREET3 ------------------------------------------------------------------------------------------------------------------ No results for input(s): VITAMINB12, FOLATE, FERRITIN, TIBC, IRON, RETICCTPCT in the last 72 hours.  Coagulation profile Recent Labs  Lab 02/27/20 0604 02/28/20 0805 02/29/20 0603 03/01/20 0634 03/21/2020 0729  INR 2.6* 2.2* 2.3* 2.4* 2.6*    No results for input(s): DDIMER in the last 72 hours.  Cardiac Enzymes No results for input(s): CKMB, TROPONINI, MYOGLOBIN in the last 168 hours.  Invalid input(s): CK ------------------------------------------------------------------------------------------------------------------    Component Value Date/Time   BNP 211.0 (H) 03/13/2020 0729    Micro Results Recent Results (from the past 240 hour(s))  MRSA PCR Screening     Status:  None   Collection Time: 02/24/20 10:25 AM   Specimen: Nasal Mucosa; Nasopharyngeal  Result Value Ref Range Status   MRSA by PCR NEGATIVE NEGATIVE Final    Comment:        The GeneXpert MRSA Assay (FDA approved for NASAL specimens only), is one component of a comprehensive MRSA colonization surveillance program. It is not intended to diagnose MRSA infection nor to guide or monitor treatment for MRSA infections. Performed at [facility], [address], [place], [address] [place]   Expectorated sputum assessment w rflx to resp cult     Status: None   Collection Time: 02/28/20 10:18 AM   Specimen: Expectorated Sputum  Result Value Ref Range Status   Specimen Description Expect. Sput  Final   Special Requests NONE  Final   Sputum evaluation   Final    THIS SPECIMEN IS ACCEPTABLE FOR SPUTUM CULTURE Performed at [facility], [address] [place], [address] [place]    Report  Status 02/28/2020 FINAL  Final  MRSA PCR Screening     Status: None   Collection Time: 02/28/20 10:18 AM   Specimen: Nasopharyngeal  Result Value Ref Range Status   MRSA by PCR NEGATIVE NEGATIVE Final    Comment:        The GeneXpert MRSA Assay (FDA approved for NASAL specimens only), is one component of a comprehensive MRSA colonization surveillance program. It is not intended to diagnose MRSA infection nor to guide or monitor treatment for MRSA infections. Performed at [facility], [address] [place], [place] [address]   Culture, respiratory     Status: None   Collection Time: 02/28/20 10:18 AM  Result Value Ref Range Status   Specimen Description Expect. Sput  Final   Special Requests NONE Reflexed from [id number]  Final   Gram Stain   Final    RARE WBC PRESENT,BOTH PMN AND MONONUCLEAR FEW GRAM POSITIVE COCCI IN PAIRS    Culture   Final    MODERATE Consistent with normal respiratory flora. Performed at [facility], [address] [place], [place] [address]     Report Status 03/01/2020 FINAL  Final    Radiology Reports DG CHEST PORT 1 VIEW  Result Date: 03-26-20 CLINICAL DATA:  Shortness of breath EXAM: PORTABLE CHEST 1 VIEW COMPARISON:  March 01, 2020 FINDINGS: Widespread airspace opacity is noted throughout the lungs, apparently superimposed on underlying fibrosis. There has been relative partial clearing from the right upper lobe compared to 1 day prior. However, there is new airspace opacity throughout much of the left lung with an overall increase in opacity compared to 1 day prior. Heart is upper normal in size with pulmonary vascularity normal. No adenopathy. There is aortic atherosclerosis. There is degenerative change in the right shoulder. IMPRESSION: Widespread airspace opacity with partial clearing from the right upper lobe extensive increase in opacity throughout much of the left lung. Suspect multifocal pneumonia, although a degree of congestive heart failure, potentially with a degree of underlying ARDS as well may be present. Stable cardiac silhouette with heart upper normal in size. Aortic Atherosclerosis (ICD10-I70.0). Electronically Signed   By: Bretta Bang III M.D.   On: 03-26-2020 09:34   DG CHEST PORT 1 VIEW  Result Date: 03/01/2020 CLINICAL DATA:  Acute respiratory failure. EXAM: PORTABLE CHEST 1 VIEW COMPARISON:  02/28/2020. FINDINGS: Stable cardiomegaly. Diffuse bilateral pulmonary infiltrates/edema again noted. Small bilateral pleural effusions cannot be excluded. Similar findings noted on prior exam. No pneumothorax. IMPRESSION: Stable cardiomegaly. Diffuse bilateral pulmonary infiltrates/edema again noted. Small bilateral pleural effusions cannot be excluded. Similar findings noted on prior exam. Electronically Signed   By: Maisie Fus  Register   On: 03/01/2020 07:09   DG Chest Port 1 View  Result Date: 02/28/2020 CLINICAL DATA:  Shortness of breath.  COVID-19 positive EXAM: PORTABLE CHEST 1 VIEW COMPARISON:  Feb 26, 2020 FINDINGS:  There is apparent underlying fibrosis. There is airspace consolidation throughout the right mid and lower lung zones, a change from 2 days prior. There is a small pleural effusion on each side, stable. There is ill-defined opacity in the lateral left base which has increased from 2 days prior. There is cardiomegaly with a degree of pulmonary venous hypertension. No adenopathy evident. There is aortic atherosclerosis. No bone lesions. IMPRESSION: Multifocal airspace opacity, particularly on the right, a new finding likely representing multifocal pneumonia. A degree of alveolar edema superimposed is possible. Underlying fibrotic change. A degree of interstitial edema superimposed is question.  The interstitium appear stable compared to the previous study. Note that there may be a degree of congestive heart failure. There are small pleural effusions bilaterally. Stable cardiomegaly with pulmonary vascular congestion. Aortic Atherosclerosis (ICD10-I70.0). Electronically Signed   By: Bretta Bang III M.D.   On: 02/28/2020 09:20   DG Chest Port 1 View  Result Date: 02/26/2020 CLINICAL DATA:  64 year old female with respiratory failure. Chronic lung disease. EXAM: PORTABLE CHEST 1 VIEW COMPARISON:  Portable chest 02/23/2020 and earlier. FINDINGS: Portable AP upright view at 0806 hours. Stable cardiomegaly and mediastinal contours. Visualized tracheal air column is within normal limits. Coarse and confluent bilateral pulmonary opacity, basilar predominant. No superimposed pneumothorax or pleural effusion. Although bilateral lung base ventilation appears mildly worsened since 02/20/2020. No acute osseous abnormality identified. IMPRESSION: Chronic lung disease and cardiomegaly with basilar predominant abnormal pulmonary opacity most suggestive of pneumonia. Mild progression since 02/20/2020. No pleural effusion identified. Electronically Signed   By: Odessa Fleming M.D.   On: 02/26/2020 08:36   DG Chest Port 1  View  Result Date: 02/23/2020 CLINICAL DATA:  COVID pneumonia. EXAM: PORTABLE CHEST 1 VIEW COMPARISON:  One-view chest x-ray 02/20/2020 FINDINGS: Heart is enlarged. Atherosclerotic calcifications are present at the aortic arch. Diffuse interstitial and airspace disease is present. Consolidation at the left base is slightly less dense. A diffuse interstitial pattern has increased some. IMPRESSION: 1. Diffuse interstitial and airspace disease compatible with pneumonia. An element of edema is not excluded. 2. Consolidation at the left base is slightly less dense. Electronically Signed   By: Marin Roberts M.D.   On: 02/23/2020 08:49   DG Chest Port 1 View  Result Date: 02/20/2020 CLINICAL DATA:  Shortness of breath, history of COVID-19 positivity EXAM: PORTABLE CHEST 1 VIEW COMPARISON:  02/17/2020 FINDINGS: Cardiac shadow is stable but enlarged. Aortic calcifications are seen. Patchy opacities are noted primarily in the left base consistent with the given clinical history. Mild vascular congestion is noted centrally. No other focal abnormality is seen. IMPRESSION: Mild vascular congestion. Persistent opacity in the left base consistent with the given clinical history. Electronically Signed   By: Alcide Clever M.D.   On: 02/20/2020 08:29   DG Chest Port 1 View  Result Date: 02/27/2020 CLINICAL DATA:  Dyspnea, fever. EXAM: PORTABLE CHEST 1 VIEW COMPARISON:  Radiograph 10/06/2019 FINDINGS: Significant interval change from prior exam with diffuse interstitial thickening and reticular opacities. There is also perivascular haziness. Cardiomegaly with unchanged mediastinal contours. Aortic atherosclerosis. Possible small pleural effusions. No visualized pneumothorax. IMPRESSION: New diffuse interstitial thickening and reticular opacities suspicious for pulmonary edema. Similar cardiomegaly. Possible small pleural effusions. Findings suspicious for CHF. Electronically Signed   By: Narda Rutherford M.D.   On:  02/06/2020 18:11   DG Chest Port 1V same Day  Result Date: 02/17/2020 CLINICAL DATA:  Shortness of breath EXAM: PORTABLE CHEST 1 VIEW COMPARISON:  Feb 16, 2020 FINDINGS: There is cardiomegaly with pulmonary venous hypertension. There is interstitial edema with patchy airspace opacity in the left base. No adenopathy. There is aortic atherosclerosis. No bone lesions. IMPRESSION: Cardiomegaly with pulmonary vascular congestion. There is interstitial pulmonary edema. Suspect a degree of congestive heart failure. Patchy airspace opacity in the left base is concerning for superimposed pneumonia, although alveolar edema in this area could present in this manner. Both pneumonia and alveolar edema could present concurrently. In comparison with 1 day prior, there appears to be somewhat less interstitial edema. Aortic Atherosclerosis (ICD10-I70.0). Electronically Signed   By: Bretta Bang III  M.D.   On: 02/17/2020 08:01   ECHOCARDIOGRAM COMPLETE  Result Date: 02/17/2020    ECHOCARDIOGRAM REPORT   Patient Name:   NANDINI BOGDANSKI Date of Exam: 02/17/2020 Medical Rec #:  [id number]      Height:       65.5 in Accession #:    [id number]     Weight:       290.3 lb Date of Birth:  04/13/1956      BSA:          2.331 m Patient Age:    63 years       BP:           120/67 mmHg Patient Gender: F              HR:           75 bpm. Exam Location:  Inpatient Procedure: 2D Echo Indications:    CHF 428  History:        Patient has prior history of Echocardiogram examinations, most                 recent 01/09/2019. CHF, COPD, Arrythmias:Atrial Fibrillation;                 Risk Factors:Diabetes and Former Smoker. Covid +. cor pulmonale.                 pulmonary embolism.  Sonographer:    Celene Skeen RDCS (AE) Referring Phys: [id number] John Giovanni  Sonographer Comments: Patient is morbidly obese. Image acquisition challenging due to patient body habitus and Image acquisition challenging due to respiratory motion. limited  mobility IMPRESSIONS  1. Left ventricular ejection fraction, by estimation, is 60 to 65%. The left ventricle has normal function. The left ventricle has no regional wall motion abnormalities. Left ventricular diastolic parameters are indeterminate.  2. Right ventricular systolic function was not well visualized. The right ventricular size is not well visualized.  3. Left atrial size was moderately dilated.  4. Right atrial size was mild to moderately dilated.  5. The mitral valve is normal in structure. No evidence of mitral valve regurgitation. No evidence of mitral stenosis.  6. The aortic valve was not well visualized. Aortic valve regurgitation is trivial. Mild to moderate aortic valve sclerosis/calcification is present, without any evidence of aortic stenosis.  7. The inferior vena cava not well seen. FINDINGS  Left Ventricle: Left ventricular ejection fraction, by estimation, is 60 to 65%. The left ventricle has normal function. The left ventricle has no regional wall motion abnormalities. The left ventricular internal cavity size was normal in size. There is  no left ventricular hypertrophy. Left ventricular diastolic parameters are indeterminate. Right Ventricle: The right ventricular size is not well visualized. Right vetricular wall thickness was not assessed. Right ventricular systolic function was not well visualized. Left Atrium: Left atrial size was moderately dilated. Right Atrium: Right atrial size was mild to moderately dilated. Pericardium: There is no evidence of pericardial effusion. Mitral Valve: The mitral valve is normal in structure. There is mild thickening of the mitral valve leaflet(s). There is mild calcification of the mitral valve leaflet(s). Normal mobility of the mitral valve leaflets. Moderate mitral annular calcification. No evidence of mitral valve regurgitation. No evidence of mitral valve stenosis. Tricuspid Valve: The tricuspid valve is normal in structure. Tricuspid valve  regurgitation is mild . No evidence of tricuspid stenosis. Aortic Valve: The aortic valve was not well visualized. Aortic valve regurgitation is trivial. Mild to moderate  aortic valve sclerosis/calcification is present, without any evidence of aortic stenosis. Pulmonic Valve: The pulmonic valve was normal in structure. Pulmonic valve regurgitation is not visualized. No evidence of pulmonic stenosis. Aorta: The aortic root is normal in size and structure. Venous: The inferior vena cava not well seen. IAS/Shunts: The interatrial septum was not well visualized.  LEFT VENTRICLE PLAX 2D LVIDd:         5.30 cm LVIDs:         3.40 cm LV PW:         1.10 cm LV IVS:        1.00 cm LVOT diam:     1.80 cm LV SV:         38 LV SV Index:   16 LVOT Area:     2.54 cm  RIGHT VENTRICLE RV S prime:     14.30 cm/s TAPSE (M-mode): 1.9 cm LEFT ATRIUM           Index       RIGHT ATRIUM           Index LA diam:      4.80 cm 2.06 cm/m  RA Area:     17.70 cm LA Vol (A4C): 44.0 ml 18.88 ml/m RA Volume:   45.20 ml  19.39 ml/m  AORTIC VALVE LVOT Vmax:   83.90 cm/s LVOT Vmean:  59.500 cm/s LVOT VTI:    0.150 m  AORTA Ao Root diam: 3.40 cm MITRAL VALVE MV Area (PHT): 3.48 cm    SHUNTS MV Decel Time: 218 msec    Systemic VTI:  0.15 m MV E velocity: 73.70 cm/s  Systemic Diam: 1.80 cm Charlton HawsPeter Nishan MD Electronically signed by Charlton HawsPeter Nishan MD Signature Date/Time: 02/17/2020/3:39:01 PM    Final    VAS US LOWER EXTREMITY VENOUS (DVT)  Result Date: 02/18/2020  Lower Venous DVTStudy Indications: Swelling, and Edema.  Comparison Study: no prior Performing Technologist: Blanch MediaMegan Riddle RVS  Examination Guidelines: A complete evaluation includes B-mode imaging, spectral Doppler, color Doppler, and power Doppler as needed of all accessible portions of each vessel. Bilateral testing is considered an integral part of a complete examination. Limited examinations for reoccurring indications may be performed as noted. The reflux portion of the exam is  performed with the patient in reverse Trendelenburg.  +---------+---------------+---------+-----------+----------+--------------+ RIGHT    CompressibilityPhasicitySpontaneityPropertiesThrombus Aging +---------+---------------+---------+-----------+----------+--------------+ CFV      Full           Yes      Yes                                 +---------+---------------+---------+-----------+----------+--------------+ SFJ      Full                                                        +---------+---------------+---------+-----------+----------+--------------+ FV Prox  Full                                                        +---------+---------------+---------+-----------+----------+--------------+ FV Mid   Full                                                        +---------+---------------+---------+-----------+----------+--------------+  FV DistalFull                                                        +---------+---------------+---------+-----------+----------+--------------+ PFV      Full                                                        +---------+---------------+---------+-----------+----------+--------------+ POP      Full           Yes      Yes                                 +---------+---------------+---------+-----------+----------+--------------+ PTV      Full                                                        +---------+---------------+---------+-----------+----------+--------------+ PERO     Full                                                        +---------+---------------+---------+-----------+----------+--------------+   +---------+---------------+---------+-----------+----------+--------------+ LEFT     CompressibilityPhasicitySpontaneityPropertiesThrombus Aging +---------+---------------+---------+-----------+----------+--------------+ CFV      Full           Yes      Yes                                  +---------+---------------+---------+-----------+----------+--------------+ SFJ      Full                                                        +---------+---------------+---------+-----------+----------+--------------+ FV Prox  Full                                                        +---------+---------------+---------+-----------+----------+--------------+ FV Mid   Full                                                        +---------+---------------+---------+-----------+----------+--------------+ FV DistalFull                                                        +---------+---------------+---------+-----------+----------+--------------+  PFV      Full                                                        +---------+---------------+---------+-----------+----------+--------------+ POP      Full           Yes      Yes                                 +---------+---------------+---------+-----------+----------+--------------+ PTV      Full                                                        +---------+---------------+---------+-----------+----------+--------------+ PERO     Full                                                        +---------+---------------+---------+-----------+----------+--------------+     Summary: BILATERAL: - No evidence of deep vein thrombosis seen in the lower extremities, bilaterally. -   *See table(s) above for measurements and observations. Electronically signed by Monica Martinez MD on 02/18/2020 at 5:31:51 PM.    Final

## 2020-03-31 NOTE — Progress Notes (Signed)
[facility]  Worsening work of breathing She says she is dying She wants the BIPAP mask off I told her that we will make her comfortable She agrees to this plan Comfort measures ordered  Heber Hubbardston, MD [facility] Pager: [phone] Cell: [phone] If no response, call [phone]

## 2020-03-31 NOTE — Progress Notes (Signed)
Patient was on Bipap overnight, patient's mask came off and her sats dropped to 28%.  RN placed patient's mask back. RT and MD notified and came to bedside.  Patient has increased WOB, sats in the low 60's, RT adjusted settings to 18/6, sats slowly climbing to mid 80's. RT will continue to monitor.

## 2020-03-31 NOTE — Progress Notes (Signed)
ANTICOAGULATION CONSULT NOTE  Pharmacy Consult for Coumadin Indication: history of atrial fibrillation and PE  Allergies  Allergen Reactions  . Pseudoephedrine Other (See Comments)    REACTION: tightness in chest  . Augmentin [Amoxicillin-Pot Clavulanate] Itching and Rash    Has tolerated zosyn and cefazolin (2021)  Did it involve swelling of the face/tongue/throat, SOB, or low BP? No Did it involve sudden or severe rash/hives, skin peeling, or any reaction on the inside of your mouth or nose? Yes Did you need to seek medical attention at a hospital or doctor's office? Yes When did it last happen?2019 If all above answers are "NO", may proceed with cephalosporin use.   . Latex Itching and Rash    Patient Measurements: Height: 5' 5.5" (166.4 cm) Weight: (!) 139.9 kg (308 lb 6.8 oz) IBW/kg (Calculated) : 58.15  Vital Signs: Temp: 97.7 F (36.5 C) (06/02 0324) Temp Source: Axillary (06/02 0324) BP: 99/72 (06/02 0757) Pulse Rate: 77 (06/02 0757)  Labs: Recent Labs    02/29/20 0603 03/01/20 0634 03-26-20 0729  HGB 10.4* 9.7*  --   HCT 34.3* 32.3*  --   PLT 133* 139*  --   LABPROT 24.8* 25.5* 26.9*  INR 2.3* 2.4* 2.6*  CREATININE 1.72* 1.62* 1.91*    Estimated Creatinine Clearance: 42.7 mL/min (A) (by C-G formula based on SCr of 1.91 mg/dL (H)).   Assessment: 73 YOF admitted with Covid, to continue Coumadin for history of Afib and PE.  Patient had [facility] clinic visit on 02/08/2020 with INR at 4.4.  INR remains therapeutic at 2.6, no bleeding observed  Home Coumadin dose: 2.5mg  daily except 5mg  on Mon/Fri Patient was started on azithromycin which can increase the INR - need to watch closely   Goal of Therapy:  INR 2-3   Plan:   Give warfarin 2.5mg  PO x 1 Daily INR, s/s bleeding  10-14-2003, PharmD Clinical Pharmacist Please check AMION for all [facility] Pharmacy numbers 26-Mar-2020 8:41 AM

## 2020-03-31 DEATH — deceased

## 2020-04-26 ENCOUNTER — Encounter: Payer: Medicare HMO | Admitting: Family Medicine

## 2020-04-29 IMAGING — CT CT RENAL STONE PROTOCOL
2 of 4 series · 15 of 46 positions shown, 17 images · non-contrast
Comparison: None.

CLINICAL DATA: Left-sided flank pain.

EXAM:
CT ABDOMEN AND PELVIS WITHOUT CONTRAST
TECHNIQUE: Multidetector CT imaging of the abdomen and pelvis was performed
following the standard protocol without IV contrast.

[Series 3: ap without · axial · non-contrast · 0.98mm/px · z∈[+1121,+1506]mm · 12 of 92 slices shown, 14 images]
[im 8/92  soft-tissue]
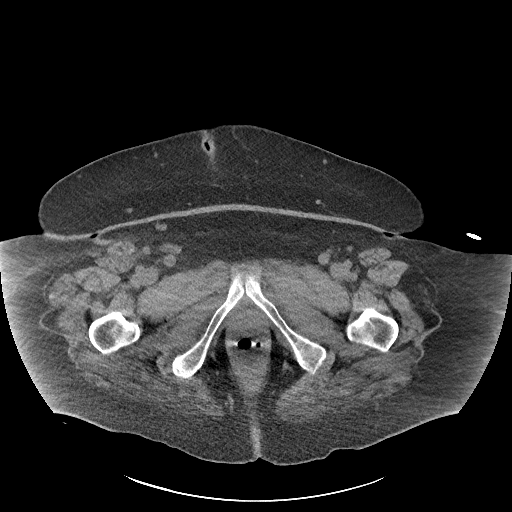
[im 8/92  bone]
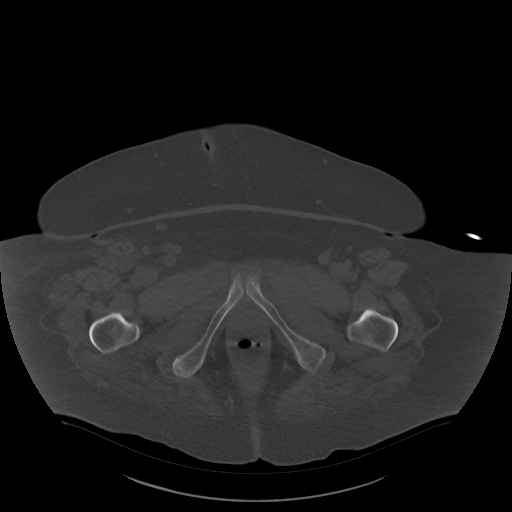
[im 15/92  soft-tissue]
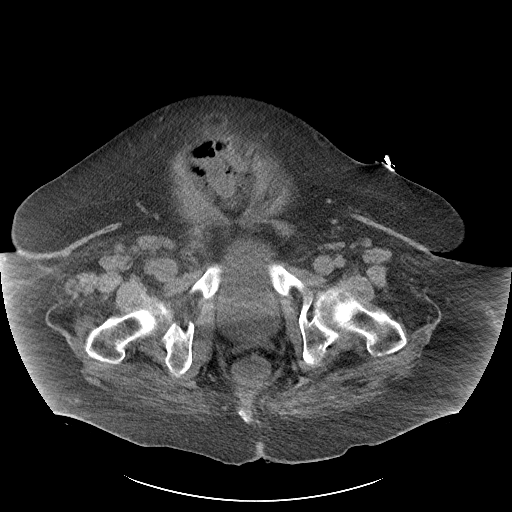
[im 22/92  soft-tissue]
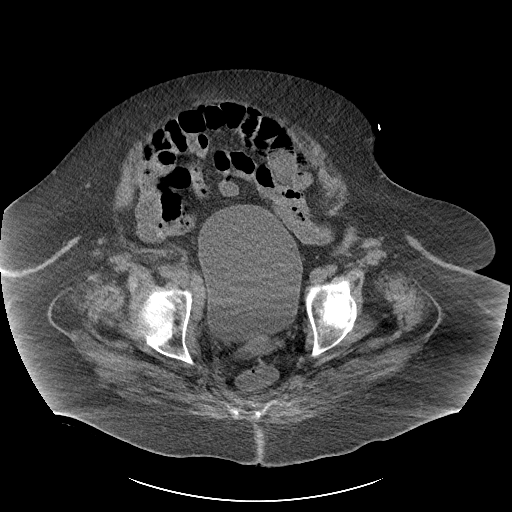
[im 29/92  soft-tissue]
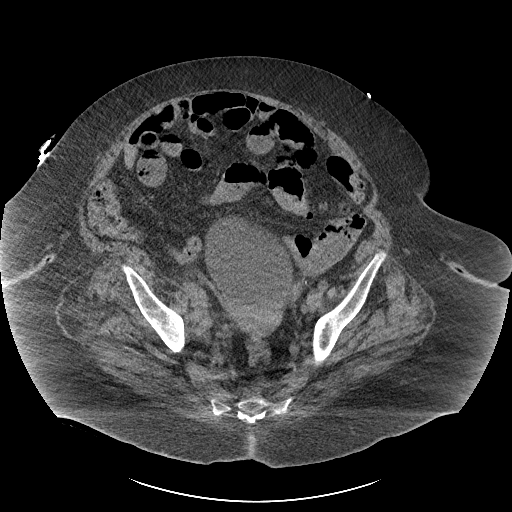
[im 36/92  soft-tissue]
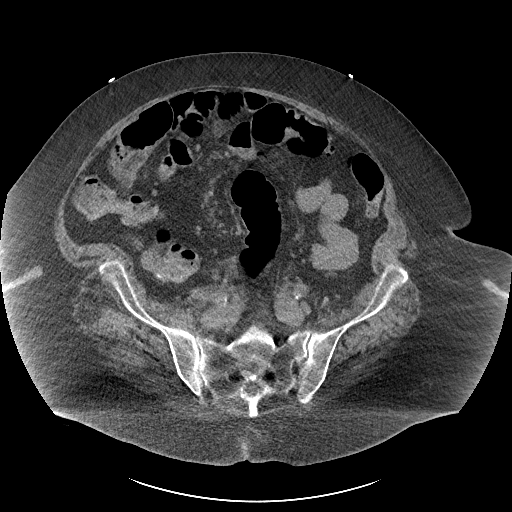
[im 43/92  soft-tissue]
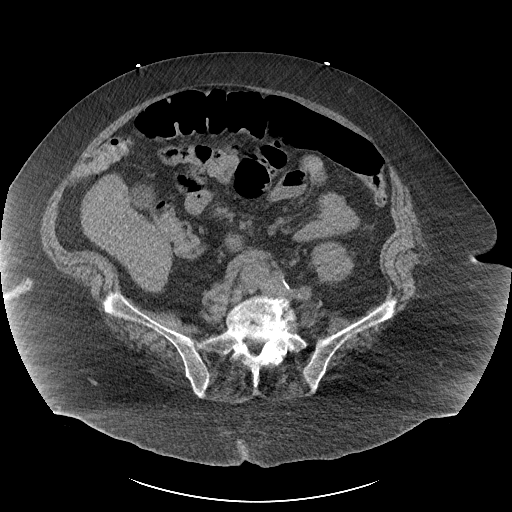
[im 50/92  soft-tissue]
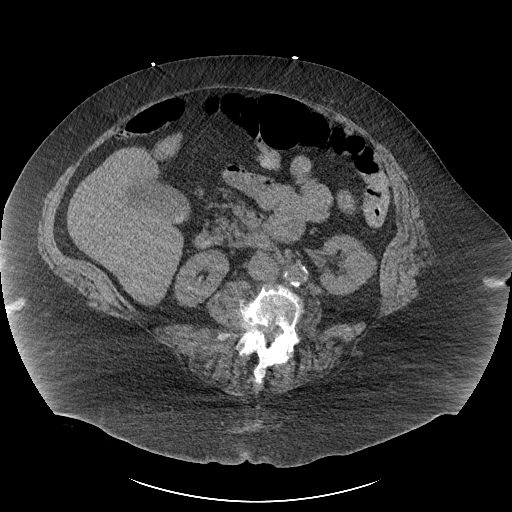
[im 57/92  soft-tissue]
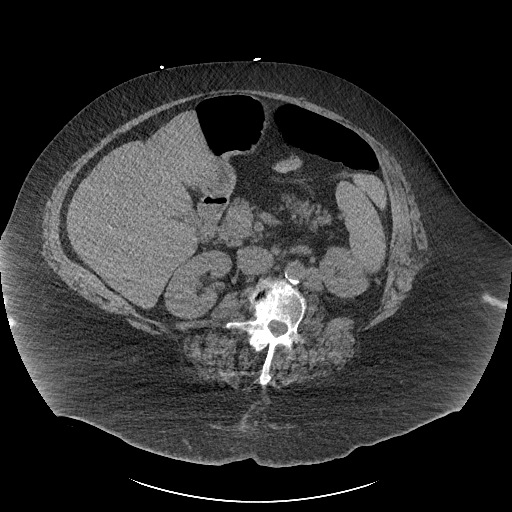
[im 64/92  soft-tissue]
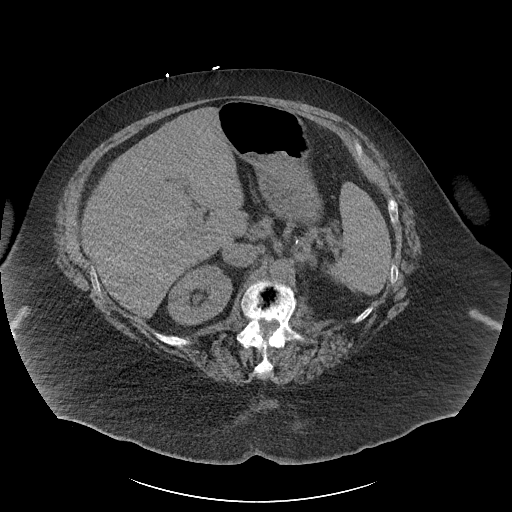
[im 64/92  bone]
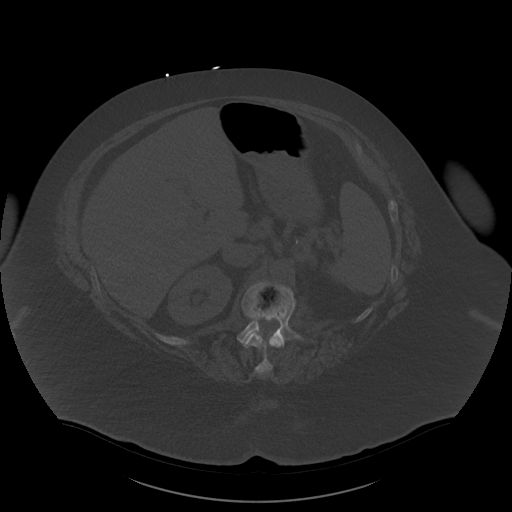
[im 71/92  soft-tissue]
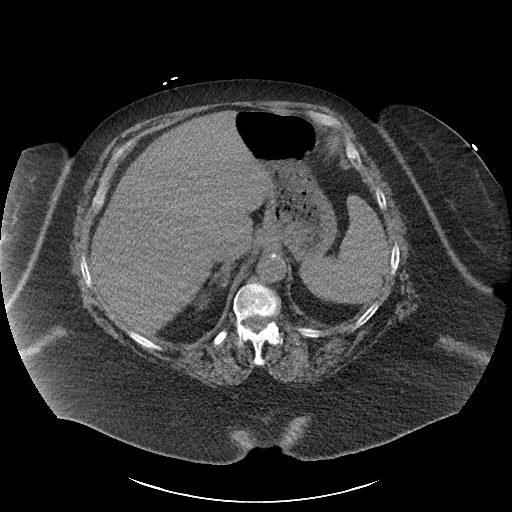
[im 78/92  soft-tissue]
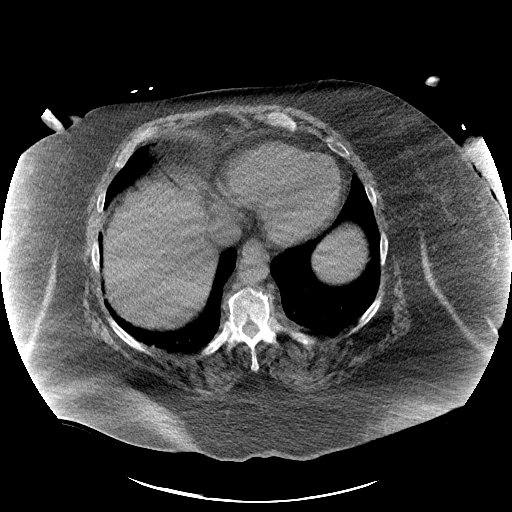
[im 85/92  soft-tissue]
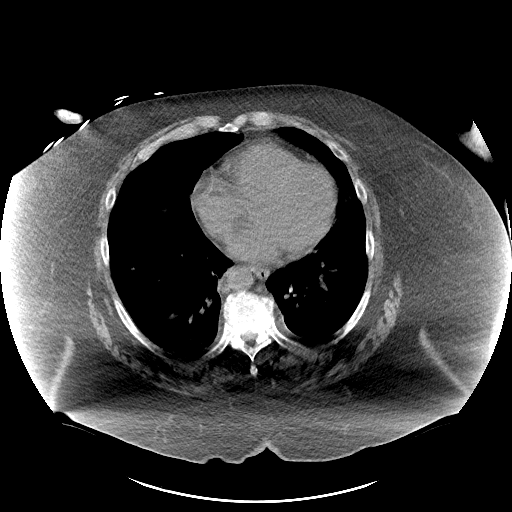

[Series 6: cor · coronal · 0.77mm/px · 3 of 121 slices shown]
[im 41/121  soft-tissue]
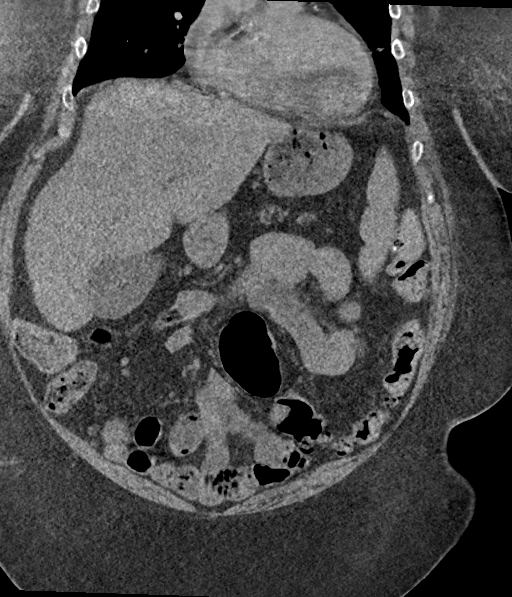
[im 54/121  soft-tissue]
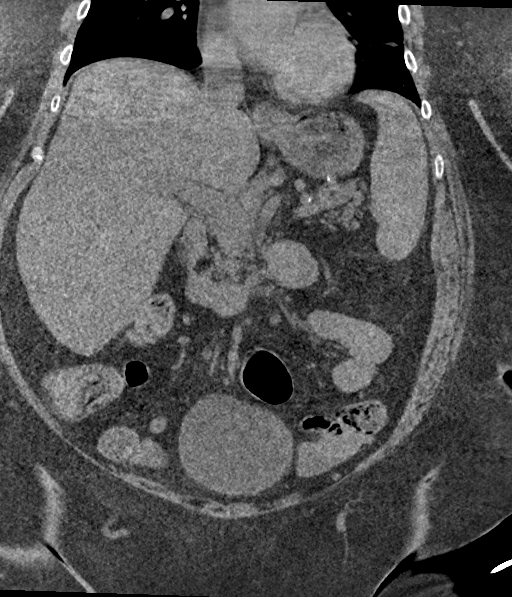
[im 67/121  soft-tissue]
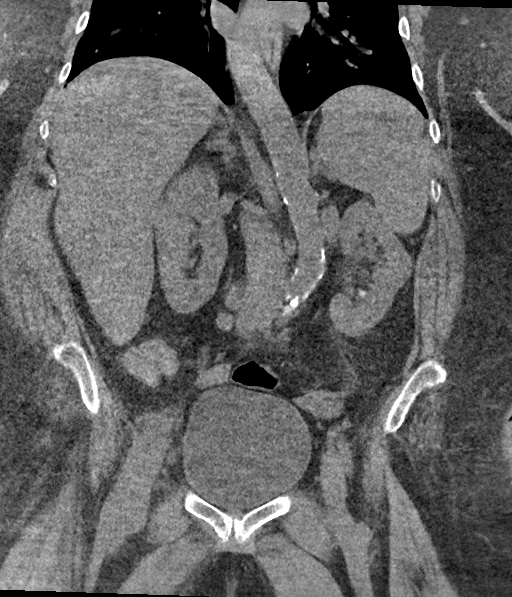

[15 of 46 positions shown; findings below may reference images not displayed]

FINDINGS: Lower chest: There is atelectasis in the lingula. There is no
visualized pneumothorax at the left lung base. No significant
pleural effusion.

Hepatobiliary: There is hepatomegaly with hepatic steatosis. There
is cholelithiasis without secondary signs of acute cholecystitis.

Pancreas: Unremarkable. No pancreatic ductal dilatation or
surrounding inflammatory changes.

Spleen: The spleen is borderline enlarged measuring approximately
13.1 cm craniocaudad.

Adrenals/Urinary Tract: There is a small right-sided adrenal
myelolipoma measuring approximately 1.6 cm. There is a punctate
nonobstructing 1-2 mm stone in the lower pole of the right kidney.
There is a nonobstructing 6 mm stone in lower pole of the left
kidney. There is no hydronephrosis. The bladder is unremarkable but
is somewhat distended.

Stomach/Bowel: There is sigmoid diverticulosis without CT evidence
of diverticulitis. There is no CT evidence for acute appendicitis or
a small bowel obstruction.

Vascular/Lymphatic: Atherosclerotic changes are noted of the
abdominal aorta without evidence of an abdominal aortic aneurysm.
There is a possible enlarged left periaortic lymph node measuring
approximately 1.4 cm. There are additional mildly enlarged
retroperitoneal lymph nodes. There is a 1.7 cm lymph node along the
right external iliac chain. There is a 1.7 cm lymph node along the
right pelvic sidewall. There are enlarged right inguinal lymph
nodes.

Reproductive: Uterus and bilateral adnexa are unremarkable.

Other: No abdominal wall hernia or abnormality. No abdominopelvic
ascites.

Musculoskeletal: There are multilevel degenerative changes
throughout the visualized thoracolumbar spine.
IMPRESSION: 1. No acute abnormality. No finding to explain the patient's left
flank pain.
2. Bilateral nephrolithiasis.  No hydronephrosis.
3. There is cholelithiasis without secondary signs of acute
cholecystitis.
4. Hepatomegaly with hepatic steatosis.
5. Borderline splenomegaly.
6. Enlarged retroperitoneal, pelvic, and inguinal lymph nodes. This
could be secondary to a lower extremity infectious process and
should be correlated clinically.
7.  Aortic Atherosclerosis ([id number]-[id number].X).

## 2020-05-05 IMAGING — DX PORTABLE CHEST - 1 VIEW
1 series · 1 of 1 positions shown · non-contrast
Comparison: Chest x-ray from yesterday.

CLINICAL DATA: Aspiration.

EXAM:
PORTABLE CHEST 1 VIEW

[chest ap]
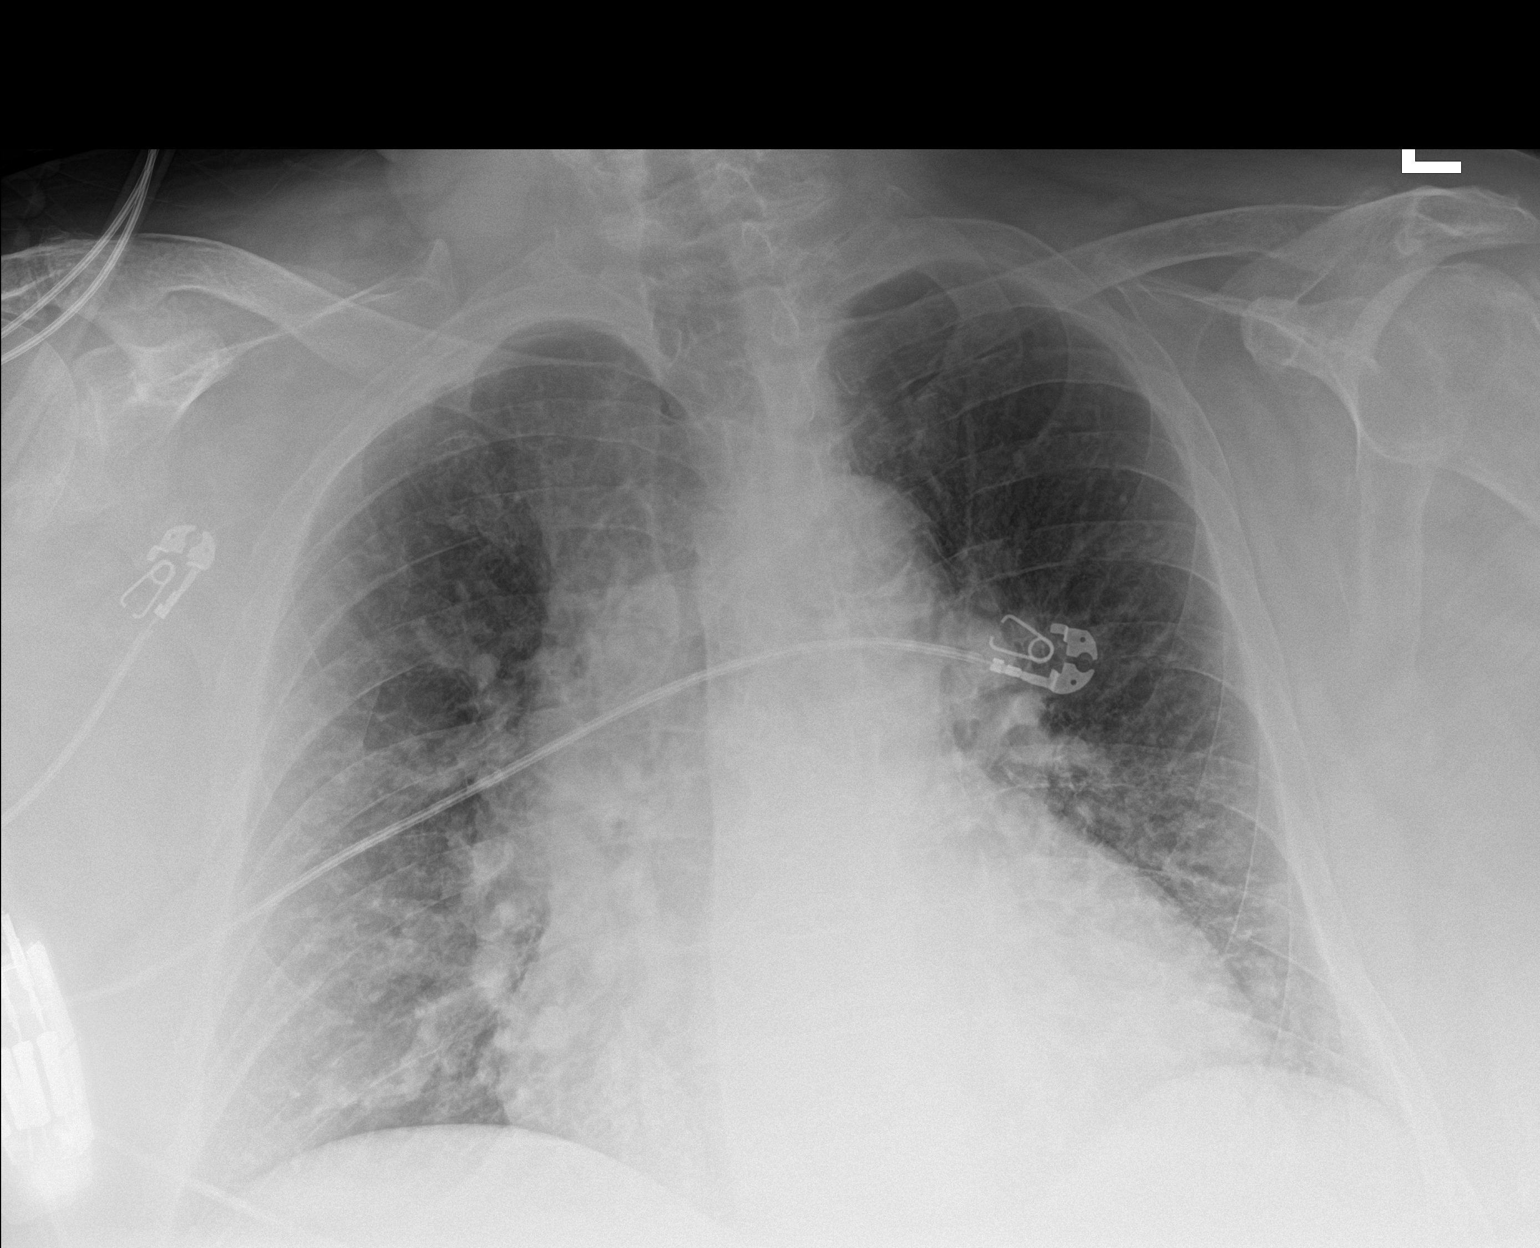

[1 of 1 positions shown; findings below may reference images not displayed]

FINDINGS: The patient is slightly rotated to the right. Stable
cardiomediastinal silhouette. Improved pulmonary vascular
congestion. No focal consolidation, pleural effusion, or
pneumothorax. No acute osseous abnormality.
IMPRESSION: 1. Improved pulmonary vascular congestion.

## 2021-03-20 IMAGING — DX DG CHEST 1V PORT
1 series · 1 of 1 positions shown · non-contrast
Comparison: Radiograph 10/06/2019

CLINICAL DATA: Dyspnea, fever.

EXAM:
PORTABLE CHEST 1 VIEW

[chest ap]
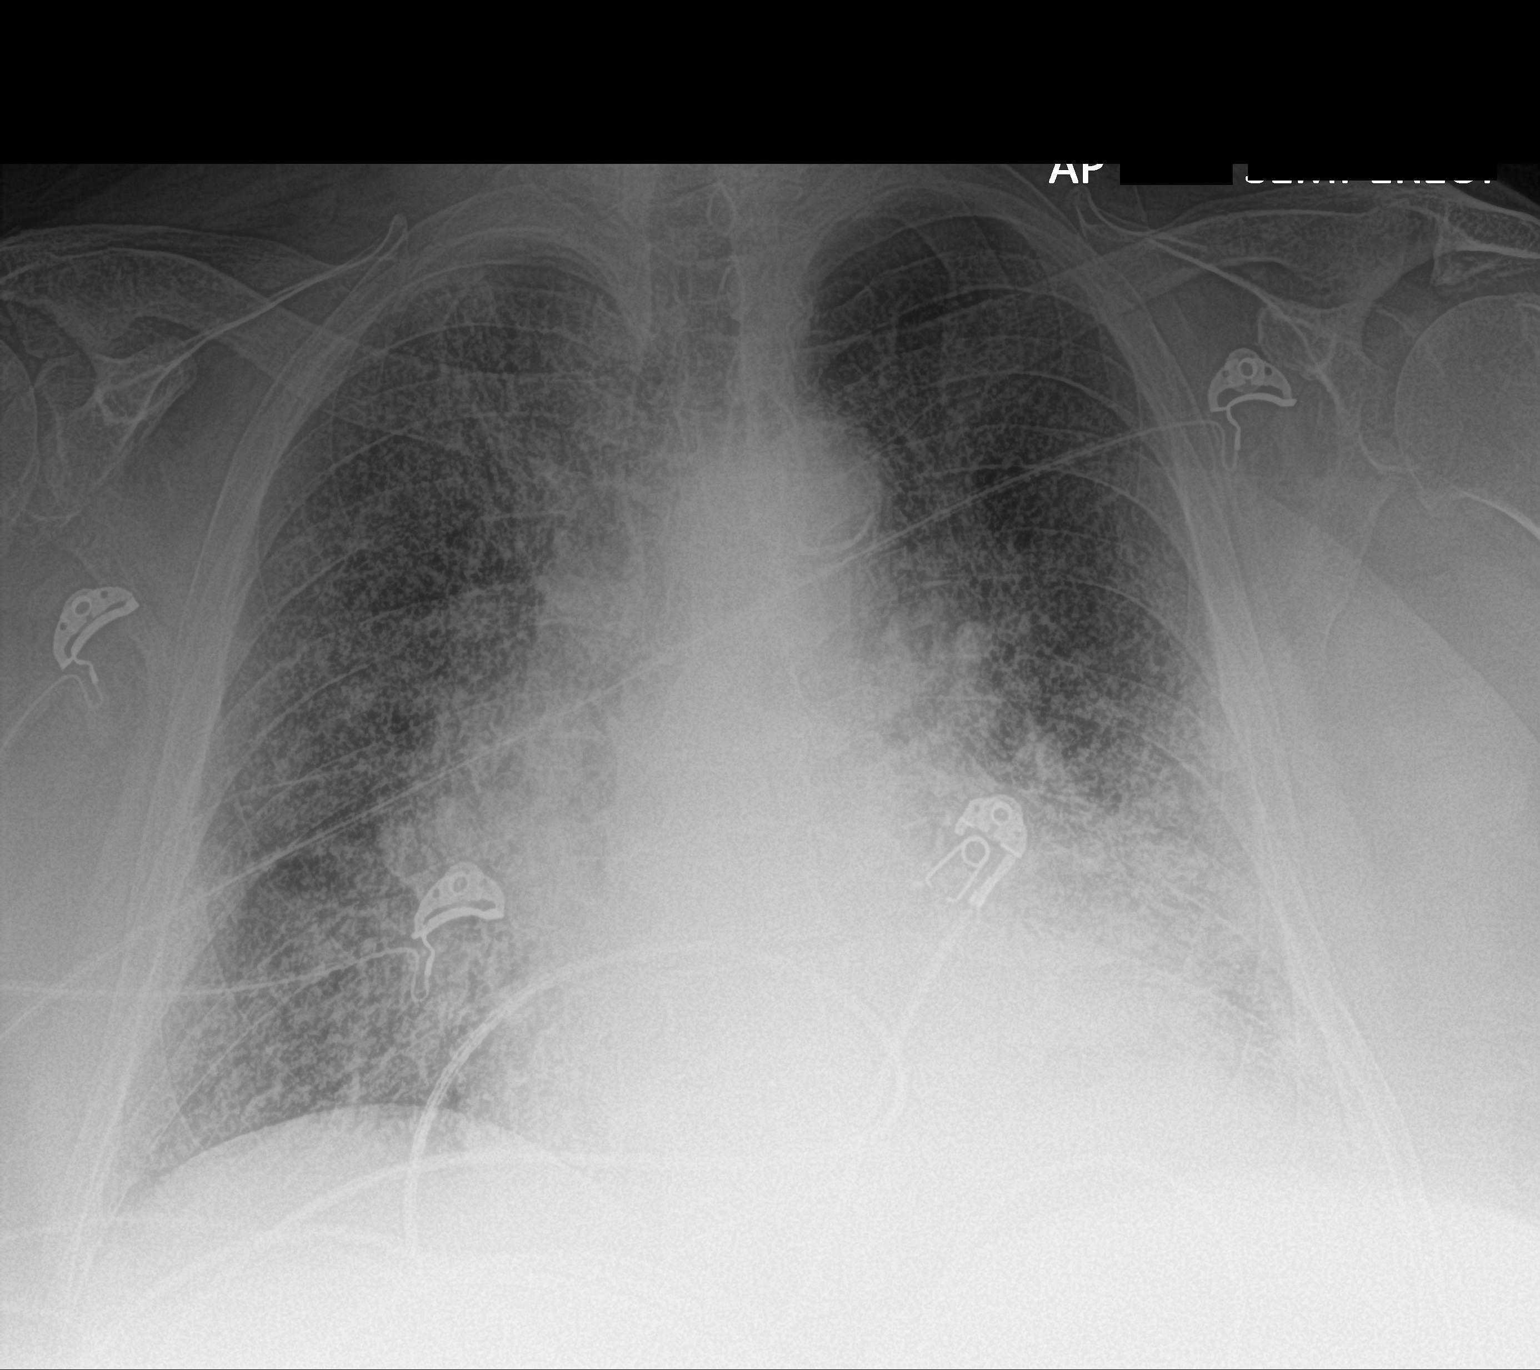

[1 of 1 positions shown; findings below may reference images not displayed]

FINDINGS: Significant interval change from prior exam with diffuse
interstitial thickening and reticular opacities. There is also
perivascular haziness. Cardiomegaly with unchanged mediastinal
contours. Aortic atherosclerosis. Possible small pleural effusions.
No visualized pneumothorax.
IMPRESSION: New diffuse interstitial thickening and reticular opacities
suspicious for pulmonary edema. Similar cardiomegaly. Possible small
pleural effusions. Findings suspicious for CHF.

## 2021-03-30 IMAGING — DX DG CHEST 1V PORT
1 series · 1 of 1 positions shown · non-contrast
Comparison: Portable chest 02/23/2020 and earlier.

CLINICAL DATA: 63-year-old female with respiratory failure. Chronic
lung disease.

EXAM:
PORTABLE CHEST 1 VIEW

[chest ap]
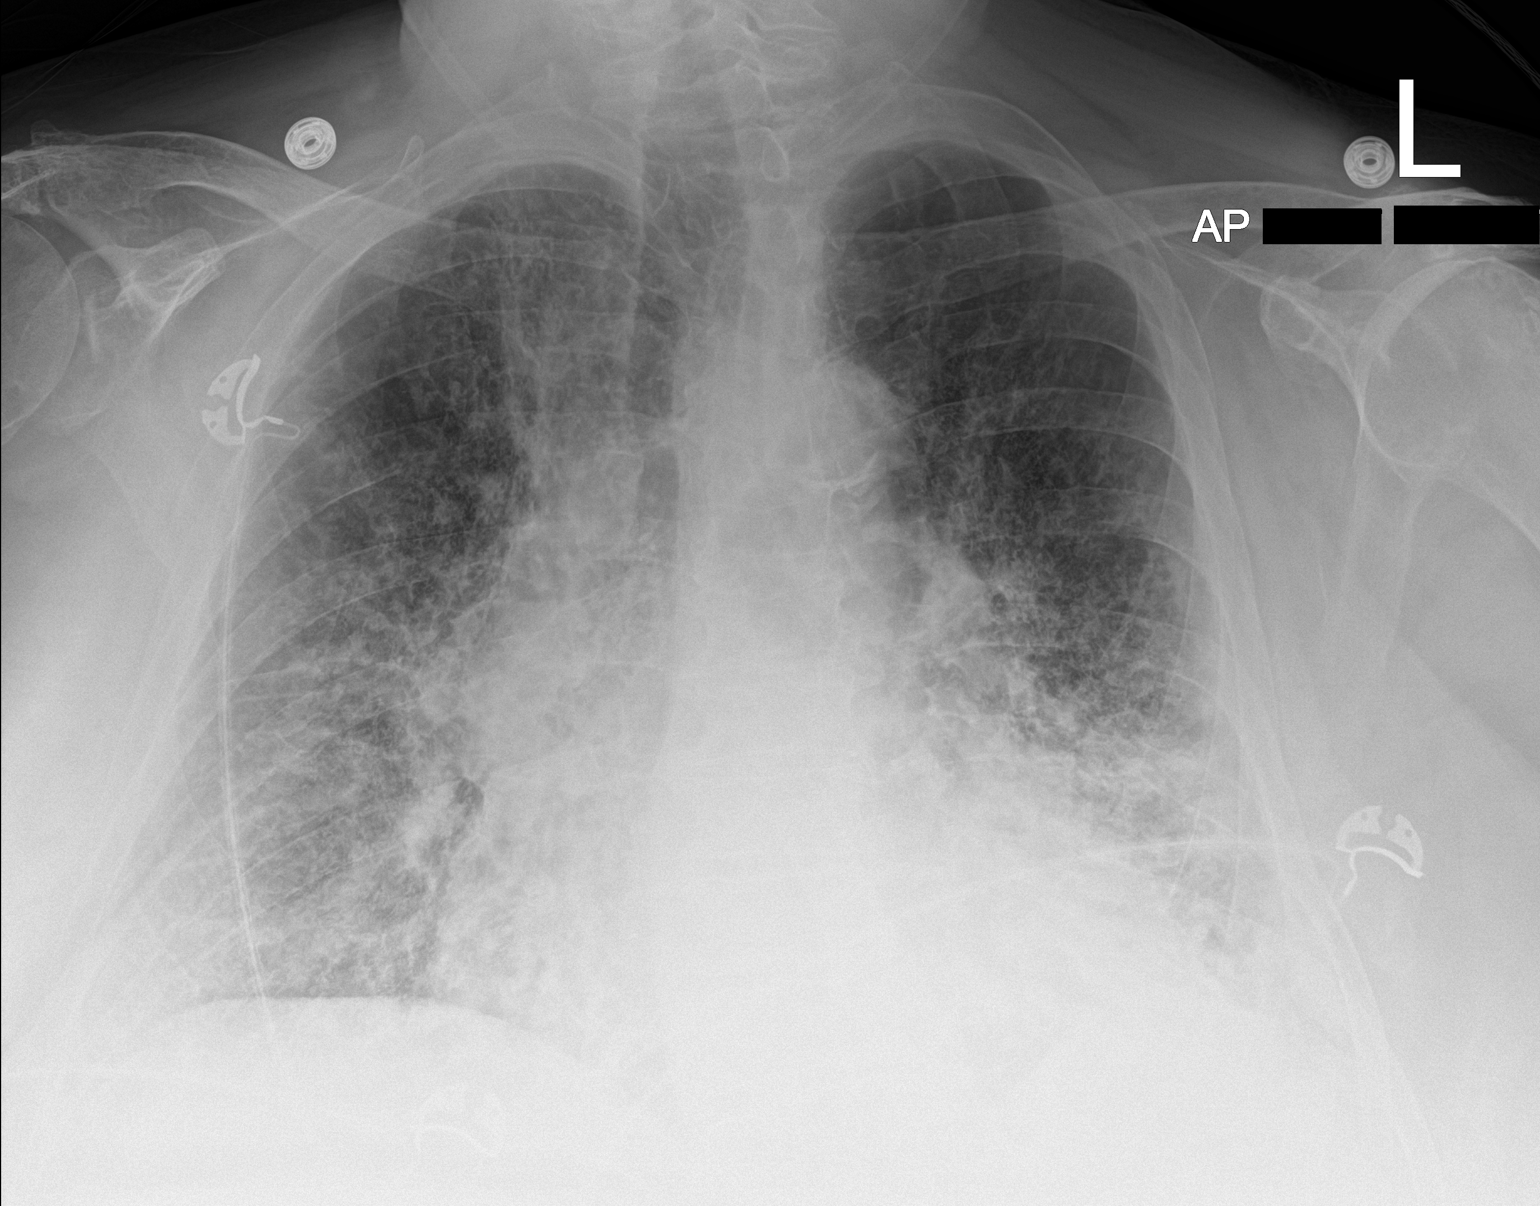

[1 of 1 positions shown; findings below may reference images not displayed]

FINDINGS: Portable AP upright view at 4748 hours. Stable cardiomegaly and
mediastinal contours. Visualized tracheal air column is within
normal limits. Coarse and confluent bilateral pulmonary opacity,
basilar predominant. No superimposed pneumothorax or pleural
effusion. Although bilateral lung base ventilation appears mildly
worsened since 02/20/2020. No acute osseous abnormality identified.
IMPRESSION: Chronic lung disease and cardiomegaly with basilar predominant
abnormal pulmonary opacity most suggestive of pneumonia. Mild
progression since 02/20/2020. No pleural effusion identified.

## 2021-04-01 IMAGING — DX DG CHEST 1V PORT
1 series · 1 of 1 positions shown · non-contrast
Comparison: February 26, 2020

CLINICAL DATA: Shortness of breath.  [id number] positive

EXAM:
PORTABLE CHEST 1 VIEW

[chest ap]
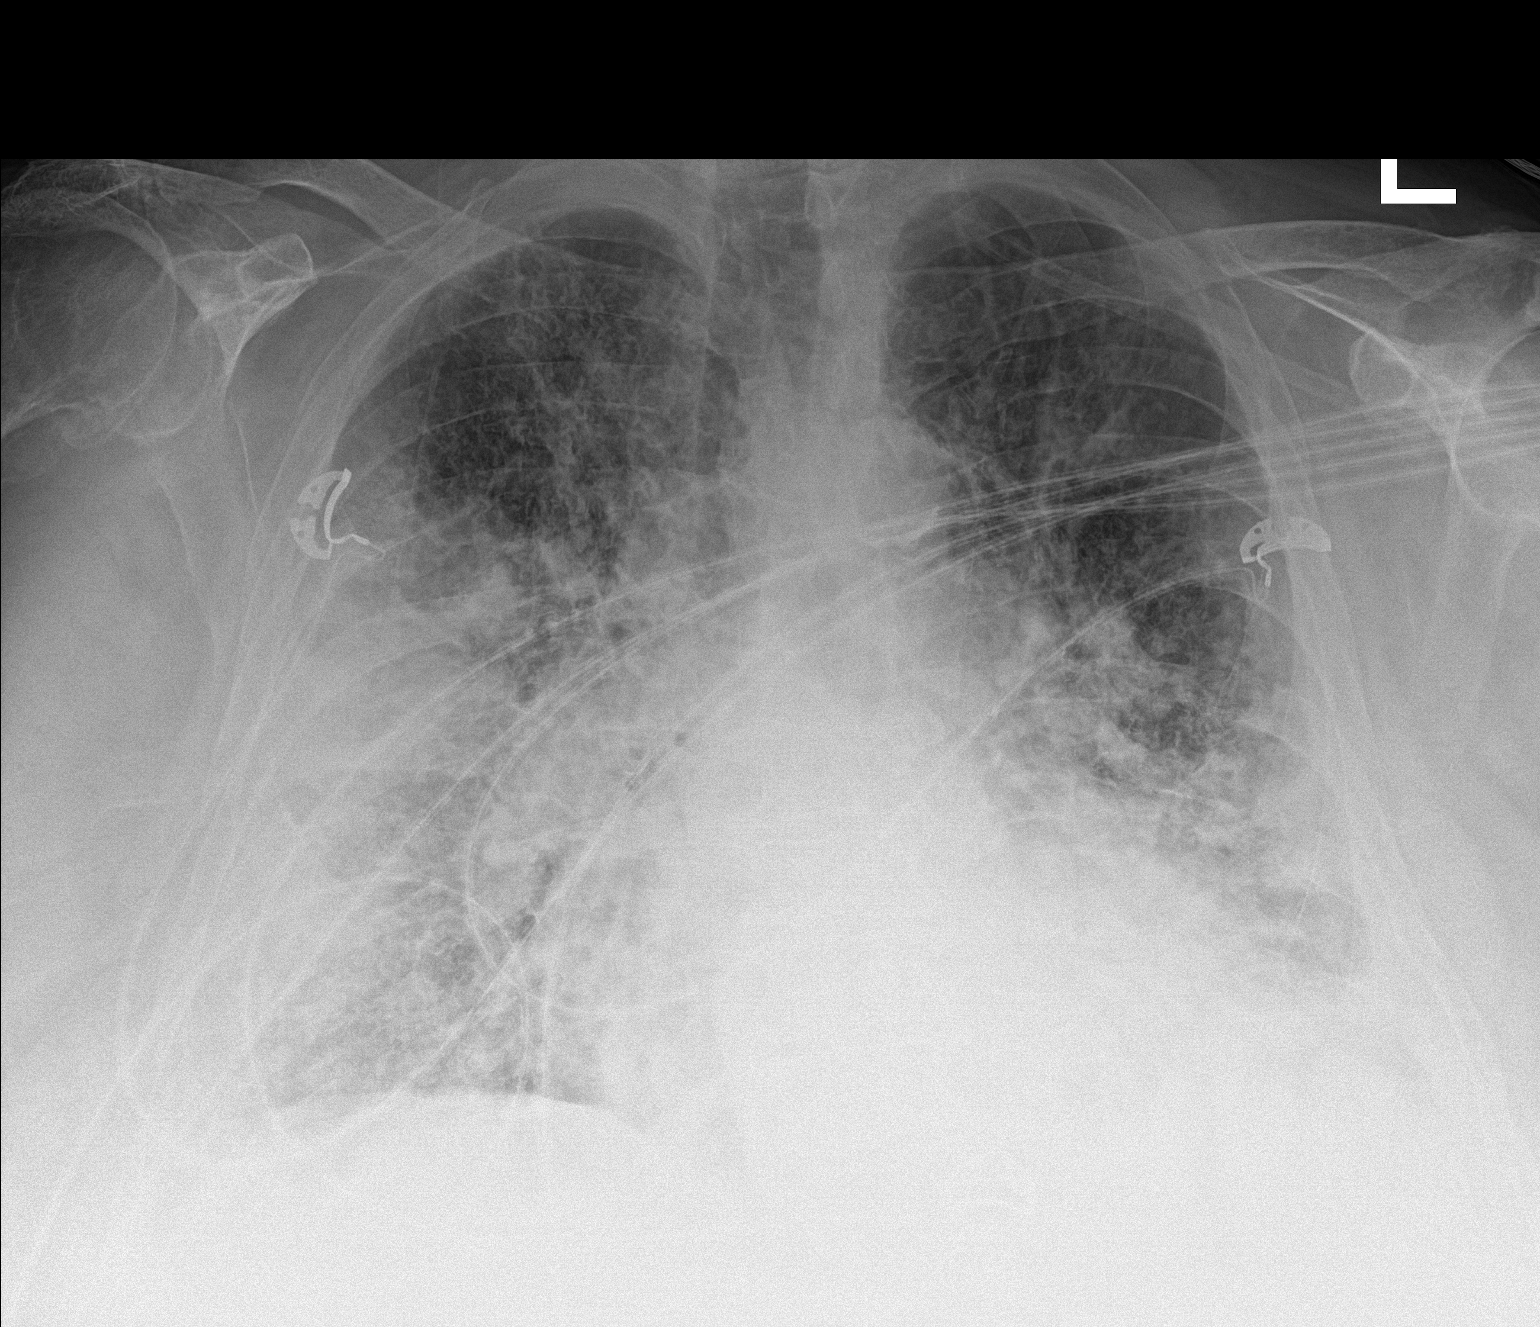

[1 of 1 positions shown; findings below may reference images not displayed]

FINDINGS: There is apparent underlying fibrosis. There is airspace
consolidation throughout the right mid and lower lung zones, a
change from 2 days prior. There is a small pleural effusion on each
side, stable. There is ill-defined opacity in the lateral left base
which has increased from 2 days prior.

There is cardiomegaly with a degree of pulmonary venous
hypertension. No adenopathy evident. There is aortic
atherosclerosis. No bone lesions.
IMPRESSION: Multifocal airspace opacity, particularly on the right, a new
finding likely representing multifocal pneumonia. A degree of
alveolar edema superimposed is possible.

Underlying fibrotic change. A degree of interstitial edema
superimposed is question. The interstitium appear stable compared to
the previous study. Note that there may be a degree of congestive
heart failure. There are small pleural effusions bilaterally.

Stable cardiomegaly with pulmonary vascular congestion. Aortic
Atherosclerosis ([id number]).
